# Patient Record
Sex: Female | Born: 1935 | Race: White | Hispanic: No | Smoking: Former smoker
Health system: Southern US, Community
[De-identification: ages and names within clinical notes are randomized; demographics above are authoritative.]

## PROBLEM LIST (undated history)

## (undated) DIAGNOSIS — R0989 Other specified symptoms and signs involving the circulatory and respiratory systems: Secondary | ICD-10-CM

## (undated) DIAGNOSIS — C4490 Unspecified malignant neoplasm of skin, unspecified: Secondary | ICD-10-CM

## (undated) DIAGNOSIS — S32000A Wedge compression fracture of unspecified lumbar vertebra, initial encounter for closed fracture: Secondary | ICD-10-CM

## (undated) DIAGNOSIS — I340 Nonrheumatic mitral (valve) insufficiency: Secondary | ICD-10-CM

## (undated) DIAGNOSIS — K219 Gastro-esophageal reflux disease without esophagitis: Secondary | ICD-10-CM

## (undated) DIAGNOSIS — I341 Nonrheumatic mitral (valve) prolapse: Secondary | ICD-10-CM

## (undated) DIAGNOSIS — I499 Cardiac arrhythmia, unspecified: Secondary | ICD-10-CM

## (undated) DIAGNOSIS — I4891 Unspecified atrial fibrillation: Secondary | ICD-10-CM

## (undated) DIAGNOSIS — K5790 Diverticulosis of intestine, part unspecified, without perforation or abscess without bleeding: Secondary | ICD-10-CM

## (undated) DIAGNOSIS — I5032 Chronic diastolic (congestive) heart failure: Secondary | ICD-10-CM

## (undated) DIAGNOSIS — K859 Acute pancreatitis without necrosis or infection, unspecified: Secondary | ICD-10-CM

## (undated) DIAGNOSIS — K222 Esophageal obstruction: Secondary | ICD-10-CM

## (undated) DIAGNOSIS — E7800 Pure hypercholesterolemia, unspecified: Secondary | ICD-10-CM

## (undated) DIAGNOSIS — N6019 Diffuse cystic mastopathy of unspecified breast: Secondary | ICD-10-CM

## (undated) DIAGNOSIS — K5792 Diverticulitis of intestine, part unspecified, without perforation or abscess without bleeding: Secondary | ICD-10-CM

## (undated) DIAGNOSIS — K449 Diaphragmatic hernia without obstruction or gangrene: Secondary | ICD-10-CM

## (undated) DIAGNOSIS — A419 Sepsis, unspecified organism: Secondary | ICD-10-CM

## (undated) HISTORY — DX: Nonrheumatic mitral (valve) insufficiency: I34.0

## (undated) HISTORY — DX: Chronic diastolic (congestive) heart failure: I50.32

## (undated) HISTORY — DX: Nonrheumatic mitral (valve) prolapse: I34.1

## (undated) HISTORY — DX: Esophageal obstruction: K22.2

## (undated) HISTORY — DX: Diffuse cystic mastopathy of unspecified breast: N60.19

## (undated) HISTORY — DX: Diverticulosis of intestine, part unspecified, without perforation or abscess without bleeding: K57.90

## (undated) HISTORY — DX: Diaphragmatic hernia without obstruction or gangrene: K44.9

## (undated) HISTORY — DX: Unspecified malignant neoplasm of skin, unspecified: C44.90

## (undated) HISTORY — DX: Wedge compression fracture of unspecified lumbar vertebra, initial encounter for closed fracture: S32.000A

## (undated) HISTORY — PX: SKIN CANCER EXCISION: SHX779

## (undated) HISTORY — DX: Gastro-esophageal reflux disease without esophagitis: K21.9

## (undated) HISTORY — PX: OTHER SURGICAL HISTORY: SHX169

## (undated) HISTORY — DX: Cardiac arrhythmia, unspecified: I49.9

## (undated) HISTORY — DX: Pure hypercholesterolemia, unspecified: E78.00

## (undated) HISTORY — DX: Other specified symptoms and signs involving the circulatory and respiratory systems: R09.89

## (undated) HISTORY — DX: Unspecified atrial fibrillation: I48.91

## (undated) HISTORY — DX: Diverticulitis of intestine, part unspecified, without perforation or abscess without bleeding: K57.92

## (undated) HISTORY — DX: Acute pancreatitis without necrosis or infection, unspecified: K85.90

---

## 1998-05-11 ENCOUNTER — Other Ambulatory Visit: Admission: RE | Admit: 1998-05-11 | Discharge: 1998-05-11 | Payer: Self-pay | Admitting: *Deleted

## 1999-08-29 ENCOUNTER — Other Ambulatory Visit: Admission: RE | Admit: 1999-08-29 | Discharge: 1999-08-29 | Payer: Self-pay | Admitting: Obstetrics and Gynecology

## 2000-09-25 ENCOUNTER — Other Ambulatory Visit: Admission: RE | Admit: 2000-09-25 | Discharge: 2000-09-25 | Payer: Self-pay | Admitting: Obstetrics and Gynecology

## 2001-10-22 ENCOUNTER — Other Ambulatory Visit: Admission: RE | Admit: 2001-10-22 | Discharge: 2001-10-22 | Payer: Self-pay | Admitting: Obstetrics and Gynecology

## 2002-11-08 ENCOUNTER — Other Ambulatory Visit: Admission: RE | Admit: 2002-11-08 | Discharge: 2002-11-08 | Payer: Self-pay | Admitting: Obstetrics and Gynecology

## 2003-12-21 ENCOUNTER — Other Ambulatory Visit: Admission: RE | Admit: 2003-12-21 | Discharge: 2003-12-21 | Payer: Self-pay | Admitting: Obstetrics and Gynecology

## 2004-04-06 ENCOUNTER — Ambulatory Visit: Payer: Self-pay | Admitting: Internal Medicine

## 2004-04-17 ENCOUNTER — Ambulatory Visit: Payer: Self-pay | Admitting: Internal Medicine

## 2004-05-07 ENCOUNTER — Ambulatory Visit: Payer: Self-pay | Admitting: Gastroenterology

## 2004-05-10 ENCOUNTER — Ambulatory Visit (HOSPITAL_COMMUNITY): Admission: RE | Admit: 2004-05-10 | Discharge: 2004-05-10 | Payer: Self-pay | Admitting: Gastroenterology

## 2004-05-29 ENCOUNTER — Ambulatory Visit: Payer: Self-pay | Admitting: Gastroenterology

## 2004-05-29 ENCOUNTER — Ambulatory Visit: Payer: Self-pay | Admitting: Internal Medicine

## 2004-06-03 DIAGNOSIS — K222 Esophageal obstruction: Secondary | ICD-10-CM

## 2004-06-03 HISTORY — PX: COLONOSCOPY: SHX174

## 2004-06-03 HISTORY — PX: ESOPHAGEAL DILATION: SHX303

## 2004-06-03 HISTORY — DX: Esophageal obstruction: K22.2

## 2004-06-14 ENCOUNTER — Ambulatory Visit: Payer: Self-pay | Admitting: Gastroenterology

## 2004-06-14 ENCOUNTER — Ambulatory Visit (HOSPITAL_COMMUNITY): Admission: RE | Admit: 2004-06-14 | Discharge: 2004-06-14 | Payer: Self-pay | Admitting: Gastroenterology

## 2004-06-14 DIAGNOSIS — Z8719 Personal history of other diseases of the digestive system: Secondary | ICD-10-CM

## 2004-06-14 HISTORY — DX: Personal history of other diseases of the digestive system: Z87.19

## 2004-08-14 ENCOUNTER — Ambulatory Visit: Payer: Self-pay | Admitting: Internal Medicine

## 2005-01-27 ENCOUNTER — Emergency Department: Payer: Self-pay | Admitting: Emergency Medicine

## 2005-11-21 ENCOUNTER — Ambulatory Visit: Payer: Self-pay | Admitting: Internal Medicine

## 2006-01-07 ENCOUNTER — Ambulatory Visit: Payer: Self-pay | Admitting: Internal Medicine

## 2006-01-29 ENCOUNTER — Ambulatory Visit: Payer: Self-pay | Admitting: Internal Medicine

## 2006-05-28 ENCOUNTER — Emergency Department: Payer: Self-pay | Admitting: Emergency Medicine

## 2006-06-05 ENCOUNTER — Ambulatory Visit: Payer: Self-pay | Admitting: Emergency Medicine

## 2006-06-09 ENCOUNTER — Ambulatory Visit: Payer: Self-pay | Admitting: Internal Medicine

## 2006-06-27 ENCOUNTER — Encounter: Payer: Self-pay | Admitting: Internal Medicine

## 2006-07-04 ENCOUNTER — Encounter: Payer: Self-pay | Admitting: Internal Medicine

## 2006-09-05 ENCOUNTER — Ambulatory Visit: Payer: Self-pay | Admitting: Internal Medicine

## 2007-01-06 ENCOUNTER — Encounter: Payer: Self-pay | Admitting: Internal Medicine

## 2007-01-12 ENCOUNTER — Encounter: Payer: Self-pay | Admitting: Internal Medicine

## 2007-01-15 ENCOUNTER — Encounter (INDEPENDENT_AMBULATORY_CARE_PROVIDER_SITE_OTHER): Payer: Self-pay | Admitting: *Deleted

## 2007-01-29 ENCOUNTER — Encounter: Payer: Self-pay | Admitting: Internal Medicine

## 2007-02-22 ENCOUNTER — Encounter: Payer: Self-pay | Admitting: Internal Medicine

## 2007-02-22 DIAGNOSIS — K219 Gastro-esophageal reflux disease without esophagitis: Secondary | ICD-10-CM

## 2007-03-23 ENCOUNTER — Ambulatory Visit: Payer: Self-pay | Admitting: Internal Medicine

## 2007-03-23 LAB — CONVERTED CEMR LAB: Vit D, 1,25-Dihydroxy: 52 (ref 30–89)

## 2007-07-22 ENCOUNTER — Telehealth (INDEPENDENT_AMBULATORY_CARE_PROVIDER_SITE_OTHER): Payer: Self-pay | Admitting: *Deleted

## 2007-07-23 ENCOUNTER — Encounter (INDEPENDENT_AMBULATORY_CARE_PROVIDER_SITE_OTHER): Payer: Self-pay | Admitting: *Deleted

## 2007-07-23 ENCOUNTER — Telehealth: Payer: Self-pay | Admitting: Internal Medicine

## 2007-07-24 ENCOUNTER — Encounter (INDEPENDENT_AMBULATORY_CARE_PROVIDER_SITE_OTHER): Payer: Self-pay | Admitting: *Deleted

## 2007-07-27 ENCOUNTER — Telehealth (INDEPENDENT_AMBULATORY_CARE_PROVIDER_SITE_OTHER): Payer: Self-pay | Admitting: *Deleted

## 2007-08-10 DIAGNOSIS — E785 Hyperlipidemia, unspecified: Secondary | ICD-10-CM

## 2007-08-10 DIAGNOSIS — S32009A Unspecified fracture of unspecified lumbar vertebra, initial encounter for closed fracture: Secondary | ICD-10-CM | POA: Insufficient documentation

## 2007-09-14 ENCOUNTER — Ambulatory Visit: Payer: Self-pay | Admitting: Internal Medicine

## 2007-09-14 LAB — CONVERTED CEMR LAB: Vit D, 1,25-Dihydroxy: 52 (ref 30–89)

## 2007-09-20 LAB — CONVERTED CEMR LAB
Basophils Absolute: 0 10*3/uL (ref 0.0–0.1)
Basophils Relative: 0.2 % (ref 0.0–1.0)
Calcium: 9.9 mg/dL (ref 8.4–10.5)
Eosinophils Absolute: 0.1 10*3/uL (ref 0.0–0.7)
Eosinophils Relative: 2.1 % (ref 0.0–5.0)
HCT: 37.5 % (ref 36.0–46.0)
Hemoglobin: 12.8 g/dL (ref 12.0–15.0)
Lymphocytes Relative: 29.3 % (ref 12.0–46.0)
MCHC: 34.1 g/dL (ref 30.0–36.0)
MCV: 91 fL (ref 78.0–100.0)
Monocytes Absolute: 0.7 10*3/uL (ref 0.1–1.0)
Monocytes Relative: 9.4 % (ref 3.0–12.0)
Neutro Abs: 4.2 10*3/uL (ref 1.4–7.7)
Neutrophils Relative %: 59 % (ref 43.0–77.0)
Platelets: 219 10*3/uL (ref 150–400)
RBC: 4.12 M/uL (ref 3.87–5.11)
RDW: 12.2 % (ref 11.5–14.6)
TSH: 3.72 microintl units/mL (ref 0.35–5.50)
WBC: 7.1 10*3/uL (ref 4.5–10.5)

## 2007-09-21 ENCOUNTER — Encounter (INDEPENDENT_AMBULATORY_CARE_PROVIDER_SITE_OTHER): Payer: Self-pay | Admitting: *Deleted

## 2007-09-21 ENCOUNTER — Ambulatory Visit: Payer: Self-pay | Admitting: Internal Medicine

## 2007-09-23 ENCOUNTER — Encounter (INDEPENDENT_AMBULATORY_CARE_PROVIDER_SITE_OTHER): Payer: Self-pay | Admitting: *Deleted

## 2007-09-23 LAB — CONVERTED CEMR LAB
Albumin, U: DETECTED %
Free Kappa Lt Chains,Ur: 3.15 mg/dL — ABNORMAL HIGH (ref 0.04–1.51)
Free Kappa/Lambda Ratio: 19.69 — ABNORMAL HIGH (ref 0.46–4.00)
Free Lambda Lt Chains,Ur: 0.16 mg/dL (ref 0.08–1.01)
Time: 24
Total Protein, Urine-Ur/day: 28 mg/24hr (ref 10–140)
Volume, Urine: 750 mL

## 2007-09-28 ENCOUNTER — Ambulatory Visit: Payer: Self-pay | Admitting: Internal Medicine

## 2007-09-28 ENCOUNTER — Telehealth (INDEPENDENT_AMBULATORY_CARE_PROVIDER_SITE_OTHER): Payer: Self-pay | Admitting: *Deleted

## 2007-10-05 LAB — CONVERTED CEMR LAB
ALT: 22 units/L (ref 0–35)
AST: 26 units/L (ref 0–37)
Albumin: 3.9 g/dL (ref 3.5–5.2)
Alkaline Phosphatase: 44 units/L (ref 39–117)
Bilirubin, Direct: 0.1 mg/dL (ref 0.0–0.3)
Cholesterol: 164 mg/dL (ref 0–200)
Glucose, Bld: 103 mg/dL — ABNORMAL HIGH (ref 70–99)
HDL: 59.1 mg/dL (ref >39.0)
LDL Cholesterol: 85 mg/dL (ref 0–99)
Total Bilirubin: 0.8 mg/dL (ref 0.3–1.2)
Total CHOL/HDL Ratio: 2.8
Total Protein: 6.9 g/dL (ref 6.0–8.3)
Triglycerides: 99 mg/dL (ref 0–149)
VLDL: 20 mg/dL (ref 0–40)

## 2007-11-18 ENCOUNTER — Other Ambulatory Visit: Payer: Self-pay

## 2007-11-18 ENCOUNTER — Emergency Department: Payer: Self-pay | Admitting: Emergency Medicine

## 2007-12-01 ENCOUNTER — Emergency Department: Payer: Self-pay | Admitting: Emergency Medicine

## 2008-01-02 DIAGNOSIS — K5792 Diverticulitis of intestine, part unspecified, without perforation or abscess without bleeding: Secondary | ICD-10-CM

## 2008-01-02 HISTORY — DX: Diverticulitis of intestine, part unspecified, without perforation or abscess without bleeding: K57.92

## 2008-04-12 ENCOUNTER — Encounter: Payer: Self-pay | Admitting: Internal Medicine

## 2008-07-04 DIAGNOSIS — K859 Acute pancreatitis without necrosis or infection, unspecified: Secondary | ICD-10-CM

## 2008-07-04 HISTORY — DX: Acute pancreatitis without necrosis or infection, unspecified: K85.90

## 2008-07-22 ENCOUNTER — Inpatient Hospital Stay: Payer: Self-pay | Admitting: Internal Medicine

## 2008-07-22 ENCOUNTER — Encounter: Payer: Self-pay | Admitting: Internal Medicine

## 2008-07-23 ENCOUNTER — Encounter: Payer: Self-pay | Admitting: Internal Medicine

## 2008-07-24 ENCOUNTER — Encounter: Payer: Self-pay | Admitting: Internal Medicine

## 2008-07-26 ENCOUNTER — Ambulatory Visit: Payer: Self-pay | Admitting: Internal Medicine

## 2008-07-26 DIAGNOSIS — Z8719 Personal history of other diseases of the digestive system: Secondary | ICD-10-CM | POA: Insufficient documentation

## 2008-07-26 DIAGNOSIS — T887XXA Unspecified adverse effect of drug or medicament, initial encounter: Secondary | ICD-10-CM

## 2008-07-26 LAB — CONVERTED CEMR LAB
ALT: 63 units/L — ABNORMAL HIGH (ref 0–35)
AST: 50 units/L — ABNORMAL HIGH (ref 0–37)
Albumin: 3.8 g/dL (ref 3.5–5.2)
Alkaline Phosphatase: 80 units/L (ref 39–117)
Amylase: 187 units/L — ABNORMAL HIGH (ref 27–131)
BUN: 6 mg/dL (ref 6–23)
Bilirubin, Direct: 0.3 mg/dL (ref 0.0–0.3)
CO2: 26 meq/L (ref 19–32)
Calcium: 9.6 mg/dL (ref 8.4–10.5)
Chloride: 108 meq/L (ref 96–112)
Creatinine, Ser: 0.8 mg/dL (ref 0.4–1.2)
GFR calc Af Amer: 88 mL/min
GFR calc non Af Amer: 73 mL/min
Glucose, Bld: 94 mg/dL (ref 70–99)
Lipase: 74 units/L — ABNORMAL HIGH (ref 11.0–59.0)
Phosphorus: 3.2 mg/dL (ref 2.3–4.6)
Potassium: 4.5 meq/L (ref 3.5–5.1)
Sodium: 142 meq/L (ref 135–145)
Total Bilirubin: 1 mg/dL (ref 0.3–1.2)
Total Protein: 7 g/dL (ref 6.0–8.3)

## 2008-07-27 ENCOUNTER — Telehealth (INDEPENDENT_AMBULATORY_CARE_PROVIDER_SITE_OTHER): Payer: Self-pay | Admitting: *Deleted

## 2008-08-01 ENCOUNTER — Telehealth (INDEPENDENT_AMBULATORY_CARE_PROVIDER_SITE_OTHER): Payer: Self-pay | Admitting: *Deleted

## 2008-08-08 ENCOUNTER — Ambulatory Visit: Payer: Self-pay | Admitting: Internal Medicine

## 2008-08-08 ENCOUNTER — Telehealth (INDEPENDENT_AMBULATORY_CARE_PROVIDER_SITE_OTHER): Payer: Self-pay | Admitting: *Deleted

## 2008-08-11 LAB — CONVERTED CEMR LAB
ALT: 18 units/L (ref 0–35)
AST: 25 units/L (ref 0–37)
Albumin: 3.8 g/dL (ref 3.5–5.2)
Alkaline Phosphatase: 47 units/L (ref 39–117)
Amylase: 147 units/L — ABNORMAL HIGH (ref 27–131)
Bilirubin, Direct: 0.1 mg/dL (ref 0.0–0.3)
Lipase: 40 units/L (ref 11.0–59.0)
Total Bilirubin: 0.7 mg/dL (ref 0.3–1.2)
Total Protein: 6.8 g/dL (ref 6.0–8.3)

## 2008-08-12 ENCOUNTER — Encounter (INDEPENDENT_AMBULATORY_CARE_PROVIDER_SITE_OTHER): Payer: Self-pay | Admitting: *Deleted

## 2008-09-15 ENCOUNTER — Ambulatory Visit: Payer: Self-pay | Admitting: Internal Medicine

## 2008-09-15 LAB — CONVERTED CEMR LAB
Bilirubin Urine: NEGATIVE
Blood in Urine, dipstick: NEGATIVE
Glucose, Urine, Semiquant: NEGATIVE
Ketones, urine, test strip: NEGATIVE
Nitrite: NEGATIVE
Protein, U semiquant: NEGATIVE
Specific Gravity, Urine: 1.015
Urobilinogen, UA: 0.2
WBC Urine, dipstick: NEGATIVE
pH: 6

## 2008-09-20 ENCOUNTER — Encounter (INDEPENDENT_AMBULATORY_CARE_PROVIDER_SITE_OTHER): Payer: Self-pay | Admitting: *Deleted

## 2008-10-11 ENCOUNTER — Encounter (INDEPENDENT_AMBULATORY_CARE_PROVIDER_SITE_OTHER): Payer: Self-pay | Admitting: *Deleted

## 2008-10-11 ENCOUNTER — Ambulatory Visit: Payer: Self-pay | Admitting: Internal Medicine

## 2008-10-11 LAB — CONVERTED CEMR LAB
OCCULT 1: NEGATIVE
OCCULT 2: NEGATIVE
OCCULT 3: NEGATIVE

## 2008-10-20 ENCOUNTER — Ambulatory Visit: Payer: Self-pay | Admitting: Internal Medicine

## 2008-10-25 ENCOUNTER — Encounter: Payer: Self-pay | Admitting: Internal Medicine

## 2008-10-29 LAB — CONVERTED CEMR LAB
ALT: 14 units/L (ref 0–35)
AST: 22 units/L (ref 0–37)
Albumin: 3.6 g/dL (ref 3.5–5.2)
Alkaline Phosphatase: 50 units/L (ref 39–117)
Amylase: 100 units/L (ref 27–131)
Bilirubin, Direct: 0.1 mg/dL (ref 0.0–0.3)
Lipase: 17 units/L (ref 11.0–59.0)
Total Bilirubin: 0.7 mg/dL (ref 0.3–1.2)
Total Protein: 6.8 g/dL (ref 6.0–8.3)

## 2008-11-03 ENCOUNTER — Telehealth (INDEPENDENT_AMBULATORY_CARE_PROVIDER_SITE_OTHER): Payer: Self-pay | Admitting: *Deleted

## 2008-11-10 ENCOUNTER — Ambulatory Visit: Payer: Self-pay | Admitting: Internal Medicine

## 2008-12-22 ENCOUNTER — Ambulatory Visit: Payer: Self-pay | Admitting: Internal Medicine

## 2008-12-22 LAB — CONVERTED CEMR LAB
ALT: 15 U/L
AST: 23 U/L
Albumin: 3.6 g/dL
Alkaline Phosphatase: 39 U/L
Bilirubin, Direct: 0.1 mg/dL
Cholesterol: 211 mg/dL — ABNORMAL HIGH
Direct LDL: 140.9 mg/dL
HDL: 57.9 mg/dL
Total Bilirubin: 0.9 mg/dL
Total CHOL/HDL Ratio: 4
Total CK: 99 U/L
Total Protein: 6.6 g/dL
Triglycerides: 60 mg/dL
VLDL: 12 mg/dL

## 2008-12-29 ENCOUNTER — Ambulatory Visit: Payer: Self-pay | Admitting: Internal Medicine

## 2008-12-29 DIAGNOSIS — Z85828 Personal history of other malignant neoplasm of skin: Secondary | ICD-10-CM

## 2008-12-29 HISTORY — DX: Personal history of other malignant neoplasm of skin: Z85.828

## 2008-12-29 LAB — CONVERTED CEMR LAB
Cholesterol, target level: 200 mg/dL
HDL goal, serum: 50 mg/dL
LDL Goal: 120 mg/dL

## 2009-01-06 LAB — CONVERTED CEMR LAB: Vit D, 25-Hydroxy: 23 ng/mL — ABNORMAL LOW (ref 30–89)

## 2009-01-10 ENCOUNTER — Encounter (INDEPENDENT_AMBULATORY_CARE_PROVIDER_SITE_OTHER): Payer: Self-pay | Admitting: *Deleted

## 2009-01-10 ENCOUNTER — Telehealth (INDEPENDENT_AMBULATORY_CARE_PROVIDER_SITE_OTHER): Payer: Self-pay | Admitting: *Deleted

## 2009-02-24 ENCOUNTER — Telehealth (INDEPENDENT_AMBULATORY_CARE_PROVIDER_SITE_OTHER): Payer: Self-pay | Admitting: *Deleted

## 2009-02-28 ENCOUNTER — Encounter: Payer: Self-pay | Admitting: Internal Medicine

## 2009-05-04 ENCOUNTER — Ambulatory Visit: Payer: Self-pay | Admitting: Internal Medicine

## 2009-05-14 LAB — CONVERTED CEMR LAB
ALT: 15 units/L (ref 0–35)
AST: 28 units/L (ref 0–37)
Albumin: 3.9 g/dL (ref 3.5–5.2)
Alkaline Phosphatase: 40 units/L (ref 39–117)
Bilirubin, Direct: 0.1 mg/dL (ref 0.0–0.3)
Cholesterol: 214 mg/dL — ABNORMAL HIGH (ref 0–200)
Direct LDL: 130.8 mg/dL
HDL: 70.3 mg/dL (ref >39.00)
Total Bilirubin: 0.8 mg/dL (ref 0.3–1.2)
Total CHOL/HDL Ratio: 3
Total Protein: 7 g/dL (ref 6.0–8.3)
Triglycerides: 78 mg/dL (ref 0.0–149.0)
VLDL: 15.6 mg/dL (ref 0.0–40.0)

## 2009-05-15 ENCOUNTER — Encounter (INDEPENDENT_AMBULATORY_CARE_PROVIDER_SITE_OTHER): Payer: Self-pay | Admitting: *Deleted

## 2009-06-14 ENCOUNTER — Ambulatory Visit: Payer: Self-pay | Admitting: Internal Medicine

## 2009-06-18 LAB — CONVERTED CEMR LAB: Vit D, 25-Hydroxy: 82 ng/mL (ref 30–89)

## 2009-06-19 ENCOUNTER — Encounter (INDEPENDENT_AMBULATORY_CARE_PROVIDER_SITE_OTHER): Payer: Self-pay | Admitting: *Deleted

## 2009-07-10 ENCOUNTER — Encounter: Payer: Self-pay | Admitting: Internal Medicine

## 2009-12-18 ENCOUNTER — Telehealth (INDEPENDENT_AMBULATORY_CARE_PROVIDER_SITE_OTHER): Payer: Self-pay | Admitting: *Deleted

## 2009-12-27 ENCOUNTER — Ambulatory Visit: Payer: Self-pay | Admitting: Internal Medicine

## 2010-01-05 LAB — CONVERTED CEMR LAB
ALT: 16 units/L (ref 0–35)
AST: 25 units/L (ref 0–37)
Albumin: 4.1 g/dL (ref 3.5–5.2)
Alkaline Phosphatase: 40 units/L (ref 39–117)
Bilirubin, Direct: 0.2 mg/dL (ref 0.0–0.3)
Cholesterol: 214 mg/dL — ABNORMAL HIGH (ref 0–200)
Direct LDL: 123.5 mg/dL
HDL: 72.3 mg/dL (ref >39.00)
Total Bilirubin: 0.9 mg/dL (ref 0.3–1.2)
Total CHOL/HDL Ratio: 3
Total CK: 113 units/L (ref 7–177)
Total Protein: 6.9 g/dL (ref 6.0–8.3)
Triglycerides: 50 mg/dL (ref 0.0–149.0)
VLDL: 10 mg/dL (ref 0.0–40.0)

## 2010-01-09 ENCOUNTER — Ambulatory Visit: Payer: Self-pay | Admitting: Internal Medicine

## 2010-01-09 DIAGNOSIS — M810 Age-related osteoporosis without current pathological fracture: Secondary | ICD-10-CM

## 2010-01-09 DIAGNOSIS — H9190 Unspecified hearing loss, unspecified ear: Secondary | ICD-10-CM | POA: Insufficient documentation

## 2010-07-03 NOTE — Progress Notes (Signed)
Summary: Rfill Request  Phone Note Refill Request Message from:  Patient  Refills Requested: Medication #1:  CRESTOR 20 MG TABS 1 [employer] [address]   Method Requested: Fax to [facility]  Follow-up for Phone Call        Patient scheduled appointment for 12/27/2009 for labs that were to be done in 08/2009 Follow-up by: Shonna Chock CMA,  December 18, 2009 9:18 AM    Prescriptions: CRESTOR 20 MG TABS (ROSUVASTATIN CALCIUM) 1 Mon, Weds  & Fri, **APPOINTMENT DUE**  #12 x 0   Entered by:   Shonna Chock CMA   Authorized by:   Marga Melnick MD   Signed by:   Shonna Chock CMA on 12/18/2009   Method used:   Electronically to        [facility]. [address] [phone]* (retail)       [address] [place], [place]  [id number]       Ph: [phone]       Fax: [phone]   RxID:   [id number]

## 2010-07-03 NOTE — Letter (Signed)
Summary: Results Follow up Letter  [facility] [place]  [address] [place], [place] [address]   Phone: [phone]  Fax: [phone]    06/19/2009 MRN: [id number]  Kylie May [address] [place], [place]  [address]  Dear [name],  The following are the results of your recent test(s):  Test         Result    Pap Smear:        Normal _____  Not Normal _____ Comments: ______________________________________________________ Cholesterol: LDL(Bad cholesterol):         Your goal is less than:         HDL (Good cholesterol):       Your goal is more than: Comments:  ______________________________________________________ Mammogram:        Normal _____  Not Normal _____ Comments:  ___________________________________________________________________ Hemoccult:        Normal _____  Not normal _______ Comments:    _____________________________________________________________________ Other Tests: Please see attached labs done on 06/14/2009    We routinely do not discuss normal results over the telephone.  If you desire a copy of the results, or you have any questions about this information we can discuss them at your next office visit.   Sincerely,

## 2010-07-03 NOTE — Assessment & Plan Note (Signed)
Summary: YEARLY EXAM--HAS HAD FASTING LABS, NEEDS NONFASTING LABS///SPH   Vital Signs:  Patient profile:   77 year old May Height:      60.75 inches Weight:      114.2 pounds BMI:     21.83 Temp:     98.0 degrees F oral Pulse rate:   72 / minute Resp:     14 per minute BP sitting:   120 / 70  (left arm) Cuff size:   regular  Vitals Entered By: Shonna Chock CMA (January 09, 2010 10:58 AM) CC: Yearly exam and discuss labs (patient with copy that was mailed to her), Lipid Management Comments **Patient will check clinic in [place] to see the date of her last pneumonia vaccine, if not UTD patient to call for a nurse visit appointment    CC:  Yearly exam and discuss labs (patient with copy that was mailed to her) and Lipid Management.  History of Present Illness: Here for Medicare AWV: 1.Risk factors based on Past M, S, F history:GERD,Osteoporosis,Dyslipidemia (chart updated) 2.Physical Activities: see data 3.Depression/mood: denied 4.Hearing: She is seeing Audiologist, Michaelene Song, [place], [place],a new unit has been Rxed 5.ADL's: no limitations 6.Fall Risk: Osteopenia 7.Home Safety: no issues 8.Height, weight, &visual acuity:wall chart read @ 6 ft 9.Counseling: none requested  10.Labs ordered based on risk factors: see Orders 11. Referral Coordination: none required 12.Care Plan: see Instructions 13. Cognitive Assessment: Oriented X 3; memory  & recall  excellent ; "WORLD" spelled  backwards; affect & mood normal. Hyperlipidemia Follow-Up      This is an 77 year old woman who presents for Hyperlipidemia follow-up.  The patient denies muscle aches, GI upset, abdominal pain, flushing, itching, constipation, diarrhea, and fatigue.  The patient denies the following symptoms: chest pain/pressure, exercise intolerance, dypsnea, palpitations, syncope, and pedal edema.  Compliance with medications (by patient report) has been near 100%.  Dietary compliance has been good.  The patient  reports no exercise.  Adjunctive measures currently used by the patient include ASA.    Lipid Management History:      Positive NCEP/ATP III risk factors include May age 1 years old or older and family history for ischemic heart disease (females less than 73 years old).  Negative NCEP/ATP III risk factors include no history of early menopause without estrogen hormone replacement, non-diabetic, HDL cholesterol greater than 60, non-tobacco-user status, non-hypertensive, no ASHD (atherosclerotic heart disease), no prior stroke/TIA, no peripheral vascular disease, and no history of aortic aneurysm.     Preventive Screening-Counseling & Management  Alcohol-Tobacco     Alcohol drinks/day: 1     Smoking Status: never  Caffeine-Diet-Exercise     Caffeine use/day: 1 cup/ day     Diet Comments: no diet     Does Patient Exercise: no  Hep-HIV-STD-Contraception     Dental Visit-last 6 months yes     Sun Exposure-Excessive: no  Safety-Violence-Falls     Seat Belt Use: yes     Firearms in the Home: no firearms in the home     Smoke Detectors: yes     Violence in the Home: no risk noted     Fall Risk: Osteopenia is risk      Blood Transfusions:  no.        Travel History:  no recent travel.    Current Medications (verified): 1)  Nexium 40 Mg Cpdr (Esomeprazole Magnesium) .... Take One Capsule Every Morning As Directed 2)  Asa 81mg  3)  Calcium 600mg  With Vit  D 400 Iu .[name].. 1 By Mouth Two Times A Day 4)  Crestor 20 Mg Tabs (Rosuvastatin Calcium) .[name].. 1 Mon, Weds  & Fri, **appointment Due**  Allergies: 1)  ! Flagyl  Past History:  Past Medical History: DIVERTICULOSIS, COLON (ICD-562.10) SCHATZKI'S RING, HX OF (ICD-V12.79);ESOPHAGEAL STRICTURE (ICD-530.3), PMH of  HIATAL HERNIA (ICD-553.3) COMPRESSION FRACTURE, LUMBAR VERTEBRAE (ICD-805.4) FEMORAL BRUIT, RIGHT (ICD-785.9) FIBROCYSTIC BREAST DISEASE (ICD-610.1) MITRAL VALVE PROLAPSE (ICD-424.0) HYPERCHOLESTEROLEMIA (ICD-272.0):  [place] Study LDL goal = < 160. ACID REFLUX DISEASE (ICD-530.81) Pancreatitis Hospitalized 02/ 2010 Diverticulitis, hx of 01/2008, GI ,[facility] Skin cancer, hx of, facial X2 Osteoporosis, Dr Marcelle Overlie  Social History: Smoking Status:  never Caffeine use/day:  1 cup/ day Does Patient Exercise:  no Dental Care w/in 6 mos.:  yes Sun Exposure-Excessive:  no Seat Belt Use:  yes Fall Risk:  Osteopenia is risk Blood Transfusions:  no  Review of Systems  The patient denies anorexia, fever, weight loss, weight gain, hoarseness, prolonged cough, hemoptysis, abdominal pain, melena, hematochezia, severe indigestion/heartburn, hematuria, incontinence, suspicious skin lesions, unusual weight change, abnormal bleeding, enlarged lymph nodes, and angioedema.         Dr Marcelle Overlie monitoring Osteoporosis. Vit D level was 82 in 06/2009  Physical Exam  General:  Appears much younger than age; alert,appropriate and cooperative throughout examination Head:  Normocephalic and atraumatic without obvious abnormalities.  Eyes:  No corneal or conjunctival inflammation noted. EOMI. Perrla. Slight ptosis OS Ears:  Hearing is grossly decreased  bilaterally despite aids. Nose:  External nasal examination shows no deformity or inflammation. Nasal mucosa are pink and moist without lesions or exudates. Mouth:  Oral mucosa and oropharynx without lesions or exudates.  Teeth in good repair. Neck:  No deformities, masses, or tenderness noted. Lungs:  Normal respiratory effort, chest expands symmetrically. Lungs are clear to auscultation, no crackles or wheezes. Heart:  normal rate, regular rhythm, no gallop, no rub, no JVD, no HJR, and grade 1 /6 systolic murmur.   Abdomen:  Bowel sounds positive,abdomen soft and non-tender without masses, organomegaly or hernias noted. Msk:  Scoliosis Pulses:  R and L carotid,radial,dorsalis pedis and posterior tibial pulses are full and equal bilaterally Extremities:  No clubbing,  cyanosis, edema. DIP OA deformity  on . Normal full range of motion of all joints(exceptionally good ).   Neurologic:  alert & oriented X3 and DTRs symmetrical and normal.   Skin:  Intact without suspicious lesions or rashes Cervical Nodes:  No lymphadenopathy noted Axillary Nodes:  No palpable lymphadenopathy Psych:  memory intact for recent and remote, normally interactive, good eye contact, and not anxious appearing.     Impression & Recommendations:  Problem # 1:  PREVENTIVE HEALTH CARE (ICD-V70.0)  Orders: [facility] -Subsequent Annual Wellness Visit [phone])  Problem # 2:  HYPERCHOLESTEROLEMIA (ICD-272.0)  Her updated medication list for this problem includes:    Crestor 20 Mg Tabs (Rosuvastatin calcium) .[name]... 1 mon, weds  & fri  Orders: EKG w/ Interpretation (93000)  Problem # 3:  HEARING LOSS, BILATERAL (ICD-389.9) Severe; F/U with Audiologist recommended  Problem # 4:  ESOPHAGEAL STRICTURE (ICD-530.3) PMH of, essentially asymptomatic @ present  Complete Medication List: 1)  Nexium 40 Mg Cpdr (Esomeprazole magnesium) .... Take one capsule every morning as directed 2)  Asa 81mg   3)  Calcium 600mg  With Vit D 400 Iu  .[name].. 1 by mouth two times a day 4)  Crestor 20 Mg Tabs (Rosuvastatin calcium) .[name].. 1 mon, weds  & fri  Other Orders: Tdap =>  66yrs IM ([address]109) Admin 1st Vaccine (60454)  Lipid Assessment/Plan:      Based on NCEP/ATP III, the patient's risk factor category is "0-1 risk factors".  The patient's lipid goals are as follows: Total cholesterol goal is 200; LDL cholesterol goal is 120; HDL cholesterol goal is 50; Triglyceride goal is 150.  Her LDL cholesterol goal has not been met.  Secondary causes for hyperlipidemia have been ruled out.  She has been counseled on adjunctive measures for lowering her cholesterol and has been provided with dietary instructions.    Patient Instructions: 1)  Please follow up with your Audiologist.Take an 81 mg coated  Aspirin every day. 2)   Please schedule a follow-up appointment in 1 year. 3)  Hepatic Panel prior to visit, ICD-9: 995.20 4)  Lipid Panel prior to visit, ICD-9:272.4 5)  Avoid foods high in acid (tomatoes, citrus juices, spicy foods). Avoid eating within two hours of lying down or before exercising. Do not over eat; try smaller more frequent meals. Elevate head of bed twelve inches when sleeping. Report any weight loss, rectal bleeding , black tarry stool or food "sticking " Prescriptions: CRESTOR 20 MG TABS (ROSUVASTATIN CALCIUM) 1 Mon, Weds  & Fri  #30 x 5   Entered and Authorized by:   Marga Melnick MD   Signed by:   Marga Melnick MD on 01/09/2010   Method used:   Print then Give to Patient   RxID:   [id number]    Immunizations Administered:  Tetanus Vaccine:    Vaccine Type: Tdap    Site: right deltoid    Mfr: GlaxoSmithKline    Dose: 0.5 ml    Route: IM    Given by: Shonna Chock CMA    Exp. Date: 08/26/2011    Lot #: [id number]    VIS given: 04/21/07 version given January 09, 2010.

## 2010-07-12 ENCOUNTER — Encounter: Payer: Self-pay | Admitting: Internal Medicine

## 2010-10-16 NOTE — Assessment & Plan Note (Signed)
[facility]                         GASTROENTEROLOGY OFFICE NOTE   NAME:May, [name]                   MRN:          [id number]  DATE:03/23/2007                            DOB:          23-Nov-1921    CHIEF COMPLAINT:  Discussing Nexium.   Dr. Alwyn May asked her to come.  She has osteopenia or osteoporosis.  She  has had improvement of her Dexascan coordinated by Dr. Marcelle May of  [facility].  She had wondered about whether she really needed  Nexium and could not really remember exactly why she was on it.  She had  an esophageal stricture dilated in 2006 by Dr. Victorino May, she has a  hiatal hernia.  She has no more dysphagia, no significant heartburn  symptoms on Nexium 40 mg daily.   Her other medications are:  1. Calcium with vitamin D 600 mg two each day.  2. Fosamax Plus D 70 mg/2800 units of vitamin D weekly.  3. Lipitor 20 mg daily.  4. Aspirin 81 mg daily.  5. Nexium 40 mg daily.   PAST MEDICAL HISTORY:  1. Osteopenia.  2. Gastroesophageal reflux disease.  3. Hiatal hernia.  4. Esophageal stricture as noted on colonoscopy 2006 (Dr. Corinda May).  5. Diverticulosis.  6. Dyslipidemia.   PHYSICAL EXAMINATION:  GENERAL:  She is in no acute distress.  VITAL SIGNS:  Weight 121 pounds, pulse 80, blood pressure 120/70.   ASSESSMENT:  1. Gastroesophageal reflux disease with esophageal stricture doing      well on Nexium therapy.  2. Osteopenia.  Certainly, Nexium can impair absorption of calcium and      vitamin D.  At least it seems so from literature.   PLAN:  1. I think she should stay on the Nexium as well as her      osteopenia/osteoporosis therapy.  She clearly has an esophageal      stricture and an appropriate reason to stay on Nexium.  Taking      Nexium daily can greatly reduce the chance that she would need an      esophageal dilation again in the future.  2. We will check a vitamin D level.  It is probably fine but  she may      not be absorbing this and      might need higher doses.  I will let her and Dr. Alwyn May and Dr.      Marcelle May know the result of that test, otherwise continue current      therapy and see me as needed.     Iva Boop, MD,FACG  Electronically Signed    CEG/MedQ  DD: 03/23/2007  DT: 03/24/2007  Job #: [id number]   cc:   Kylie May. Kylie Ren, MD,FACP,FCCP  Kylie May. Kylie May, M.D.

## 2010-10-19 NOTE — Assessment & Plan Note (Signed)
[facility]                        [facility] [facility] OFFICE NOTE   NAME:May, Kylie ZUELKE                   MRN:          [id number]  DATE:09/05/2006                            DOB:          Mar 21, 1922    [name] was seen September 05, 2006 complaining of low back pain on  the left. She has previously received physical therapy for radicular  pain on the right with significant improvement. She contacted physical  therapy and they recommended she perform previously prescribed exercises  & employ a brace .She had no improvement with these measures & employing  heat and ibuprofen. The pain became severe enough that she had pain with  each step as intense as 10 on a 10 scale. It actually improved April 3.  She will occasionally have aching in the left popliteal area and the  calf suggesting an S2 distribution.   She has negative straight leg raising and normal reflexes. Her gait is  normal. There is striking scoliosis of the thoracolumbar spine. She did  make the comment that her pain will improve straightening her back.   It appears that she has an S2 nerve root irritation. I will recommend  arthritis strength Tylenol at bedtime and Celebrex 200 mg each morning  as needed. If the pain persists at the level she described before  a  fentanyl patch 25 mcg every 3 days will be employed.   She does have a prior history of epidural steroid injections and this  could be reconsidered if the symptoms persist or progress.     Titus Dubin. Alwyn Ren, MD,FACP,FCCP  Electronically Signed    [facility]/MedQ  DD: 09/05/2006  DT: 09/05/2006  Job #: [id number]

## 2010-10-19 NOTE — Assessment & Plan Note (Signed)
[facility]                        [facility] OFFICE NOTE   NAME:Mullinax, LONNI DIRDEN                   MRN:          [id number]  DATE:06/09/2006                            DOB:          27-Oct-1921    Kylie May was seen on June 09, 2006, for follow-up of  musculoskeletal injury sustained in a fall approximately 1 month ago.  She was seen at Urgent Care and placed on Celebrex and a prednisone  Dosepak.  She was also seen in the emergency room on December 26 for  lumbosacral pain.  An MRI June 05, 2006, revealed no fracture.  She  had foraminal narrowing at L3-4 and L4-5 related to degenerative disk  disease.   She denied any paresthesias or incontinence of urine or stool.   She stated that she was significantly improved; previously she could not  lift  or stand on the right leg.  When seen in the emergency room, she  was having pain in the right anterior thigh with a pressure sensation in  the lumbosacral area.  The evaluation had been completed at the [facility] as she lives in the [place] area.   She was using a walker for ambulation.  Straight leg raising was  negative.   Flexeril 5 mg at bedtime was prescribed along with Arthritis Strength  Tylenol.  Gabapentin 100 mg every 8 hours as needed for pain was also  prescribed.  Physical therapy and occupational therapy will be completed  through the [facility].   Because of the logistics, she will transfer her care to a doctor in the  [place] area or possibly to the [facility] facility.   PAST MEDICAL HISTORY:  1. Epidural steroid injections for spinal stenosis.  2. She has had a skin cancer removed from the left nasal area.  3. She has had 3 pregnancies and 3 deliveries.  Her last pregnancy      involved twins, one of whom died.  4. She has also had a fractured ankle.  5. She has been evaluated by Ulyess Mort,  MD, for intermittent      dysphagia and odynophagia in the context of a previous history of      hiatal hernia.  6. She also has osteopenia with T-scores of -2 in the spine and -1.5      in the hip.  She has tolerated Fosamax one weekly.  Consideration      will be given to [facility] monthly if she has significant GI symptoms.  7. In 1998 she did have abnormal protein in her urine, but this was      negative on follow-up.   She remains on Lipitor and Nexium as well as calcium and vitamin A  supplements.   Her records will be transferred once her daughter has established who  will serve as her new primary care physician.     Titus Dubin. Alwyn Ren, MD,FACP,FCCP  Electronically Signed    [facility]/MedQ  DD: 07/03/2006  DT: 07/03/2006  Job #: [id number]

## 2010-10-29 ENCOUNTER — Other Ambulatory Visit: Payer: Self-pay | Admitting: Internal Medicine

## 2010-11-27 ENCOUNTER — Encounter: Payer: Self-pay | Admitting: Internal Medicine

## 2010-11-28 ENCOUNTER — Ambulatory Visit (INDEPENDENT_AMBULATORY_CARE_PROVIDER_SITE_OTHER): Payer: Medicare Other | Admitting: Internal Medicine

## 2010-11-28 ENCOUNTER — Encounter: Payer: Self-pay | Admitting: Internal Medicine

## 2010-11-28 ENCOUNTER — Telehealth: Payer: Self-pay | Admitting: *Deleted

## 2010-11-28 DIAGNOSIS — K219 Gastro-esophageal reflux disease without esophagitis: Secondary | ICD-10-CM

## 2010-11-28 DIAGNOSIS — E559 Vitamin D deficiency, unspecified: Secondary | ICD-10-CM | POA: Insufficient documentation

## 2010-11-28 DIAGNOSIS — E7800 Pure hypercholesterolemia, unspecified: Secondary | ICD-10-CM

## 2010-11-28 DIAGNOSIS — M810 Age-related osteoporosis without current pathological fracture: Secondary | ICD-10-CM

## 2010-11-28 LAB — CBC WITH DIFFERENTIAL/PLATELET
Basophils Absolute: 0 10*3/uL (ref 0.0–0.1)
Basophils Relative: 0.5 % (ref 0.0–3.0)
Eosinophils Absolute: 0.1 10*3/uL (ref 0.0–0.7)
Eosinophils Relative: 1.5 % (ref 0.0–5.0)
HCT: 40.1 % (ref 36.0–46.0)
Hemoglobin: 13.5 g/dL (ref 12.0–15.0)
Lymphocytes Relative: 28.1 % (ref 12.0–46.0)
Lymphs Abs: 2.3 10*3/uL (ref 0.7–4.0)
MCHC: 33.6 g/dL (ref 30.0–36.0)
Monocytes Absolute: 0.7 10*3/uL (ref 0.1–1.0)
Monocytes Relative: 8 % (ref 3.0–12.0)
Neutro Abs: 5.2 10*3/uL (ref 1.4–7.7)
Neutrophils Relative %: 61.9 % (ref 43.0–77.0)
Platelets: 232 10*3/uL (ref 150.0–400.0)
RBC: 4.31 Mil/uL (ref 3.87–5.11)
RDW: 12.9 % (ref 11.5–14.6)
WBC: 8.4 10*3/uL (ref 4.5–10.5)

## 2010-11-28 LAB — LDL CHOLESTEROL, DIRECT: Direct LDL: 116.7 mg/dL

## 2010-11-28 LAB — BASIC METABOLIC PANEL
BUN: 17 mg/dL (ref 6–23)
CO2: 28 mEq/L (ref 19–32)
Calcium: 10 mg/dL (ref 8.4–10.5)
Chloride: 106 mEq/L (ref 96–112)
GFR: 59.13 mL/min — ABNORMAL LOW (ref >60.00)
Glucose, Bld: 87 mg/dL (ref 70–99)
Potassium: 6.2 mEq/L (ref 3.5–5.1)
Sodium: 144 mEq/L (ref 135–145)

## 2010-11-28 LAB — LIPID PANEL
Cholesterol: 219 mg/dL — ABNORMAL HIGH (ref 0–200)
HDL: 74.3 mg/dL (ref >39.00)
Total CHOL/HDL Ratio: 3
VLDL: 17.6 mg/dL (ref 0.0–40.0)

## 2010-11-28 LAB — HEPATIC FUNCTION PANEL
ALT: 18 U/L (ref 0–35)
AST: 29 U/L (ref 0–37)
Albumin: 4.4 g/dL (ref 3.5–5.2)
Alkaline Phosphatase: 42 U/L (ref 39–117)
Bilirubin, Direct: 0.2 mg/dL (ref 0.0–0.3)
Total Bilirubin: 0.9 mg/dL (ref 0.3–1.2)
Total Protein: 6.9 g/dL (ref 6.0–8.3)

## 2010-11-28 LAB — TSH: TSH: 4.02 u[IU]/mL (ref 0.35–5.50)

## 2010-11-28 LAB — VITAMIN D 25 HYDROXY (VIT D DEFICIENCY, FRACTURES): Vit D, 25-Hydroxy: 42 ng/mL (ref 30–89)

## 2010-11-28 MED ORDER — ESOMEPRAZOLE MAGNESIUM 40 MG PO CPDR: 40.0000 mg | DELAYED_RELEASE_CAPSULE | Freq: Every day | ORAL | Status: DC

## 2010-11-28 NOTE — Patient Instructions (Signed)
The triggers for dyspepsia or "heart burn"  include stress; the "aspirin family" ; alcohol; peppermint; and caffeine (coffee, tea, cola, and chocolate). The aspirin family would include aspirin and the nonsteroidal agents such as ibuprofen &  Naproxen. Tylenol would not cause reflux. If having dyspepsia ; food & dink should be avoided for @ least 2 hors before going to bed.  

## 2010-11-28 NOTE — Progress Notes (Signed)
  Subjective:    Patient ID: Kylie May, female    DOB: 06/29/1921, 77 y.o.   MRN: [id number]  HPI Dyslipidemia assessment:    Family history of premature CAD/ MI: daughter MI @ 55 .  Nutrition: no plan .  Exercise: no . Diabetes : no . HTN: no. Smoking history  : quit 1951 .   Weight :  stable. ROS: fatigue: no ; chest pain : no ;claudication: no; palpitations: no; abd pain/bowel changes: no ; myalgias:no;  syncope : no ; memory loss: no;skin changes: skin cancer treated @ [facility] 09/03/10 Lab results reviewed :LDL 123.5, HDL 72.3 .     Review of Systems Vit D level 23 in 2010; 82 in 01/12. Intermittent hoarseness & sneezing     Objective:   Physical Exam Gen.Thin but healthy and well-nourished in appearance. Alert, appropriate and cooperative throughout exam. Appears younger than age Eyes: No corneal or conjunctival inflammation noted. Ears: External  ear exam reveals no significant lesions or deformities. Canals clear .TMs dull. Hearing is grossly decreased  Bilaterally despite aids. Nose: External nasal exam reveals no deformity or inflammation. Nasal mucosa are pink and moist. No lesions or exudates noted.   Mouth: Oral mucosa and oropharynx reveal no lesions or exudates. Teeth in good repair. Neck: No deformities, masses, or tenderness noted.  Thyroid  normal. Lungs: Normal respiratory effort; chest expands symmetrically. Lungs are clear to auscultation without rales, wheezes, or increased work of breathing. Heart: Normal rate and rhythm. Normal S1 and S2. No gallop, click, or rub. S4 w/o murmur. Abdomen: Bowel sounds normal; abdomen soft and nontender. No masses, organomegaly or hernias noted. Musculoskeletal/extremities: Scoliosis noted of  the thoracic & lumbar spine. No clubbing, cyanosis, edema. Minor DIP OA hand changes.  Nail health  good. Vascular: Carotid, radial artery, dorsalis pedis and dorsalis posterior tibial pulses are full and equal. No bruits  present. Neurologic: Alert and oriented x3. Deep tendon reflexes symmetrical and normal.         Skin: Intact without suspicious lesions or rashes. Lymph: No cervical, axillary  lymphadenopathy present. Psych: Mood and affect are normal. Normally interactive                                                                                         Assessment & Plan:  #1 comprehensive  exam; no acute findings #2 see Problem List with Assessments & Recommendations Plan: see Orders

## 2010-11-28 NOTE — Progress Notes (Signed)
Addended byPecola Lawless on: 11/28/2010 10:32 AM   Modules accepted: Orders

## 2010-11-28 NOTE — Telephone Encounter (Signed)
meds reviewed ; none should cause this. BUN & creat need to be reviewed

## 2010-11-28 NOTE — Telephone Encounter (Signed)
Critical ZOX:[id number] 6.2 will fax result to office as well

## 2010-12-03 NOTE — Telephone Encounter (Signed)
Pt made aware and received labs in mail.

## 2010-12-21 ENCOUNTER — Other Ambulatory Visit: Payer: Self-pay | Admitting: Internal Medicine

## 2010-12-21 DIAGNOSIS — E7800 Pure hypercholesterolemia, unspecified: Secondary | ICD-10-CM

## 2010-12-24 ENCOUNTER — Other Ambulatory Visit (INDEPENDENT_AMBULATORY_CARE_PROVIDER_SITE_OTHER): Payer: Medicare Other

## 2010-12-24 DIAGNOSIS — E7800 Pure hypercholesterolemia, unspecified: Secondary | ICD-10-CM

## 2010-12-24 LAB — POTASSIUM: Potassium: 4.7 mEq/L (ref 3.5–5.1)

## 2010-12-24 NOTE — Progress Notes (Signed)
Labs only

## 2010-12-27 ENCOUNTER — Other Ambulatory Visit: Payer: Self-pay | Admitting: Internal Medicine

## 2011-06-07 DIAGNOSIS — R220 Localized swelling, mass and lump, head: Secondary | ICD-10-CM | POA: Diagnosis not present

## 2011-06-07 DIAGNOSIS — R221 Localized swelling, mass and lump, neck: Secondary | ICD-10-CM | POA: Diagnosis not present

## 2011-06-07 DIAGNOSIS — L578 Other skin changes due to chronic exposure to nonionizing radiation: Secondary | ICD-10-CM | POA: Diagnosis not present

## 2011-06-10 DIAGNOSIS — Z124 Encounter for screening for malignant neoplasm of cervix: Secondary | ICD-10-CM | POA: Diagnosis not present

## 2011-06-17 ENCOUNTER — Encounter: Payer: Self-pay | Admitting: Internal Medicine

## 2011-06-17 ENCOUNTER — Ambulatory Visit (INDEPENDENT_AMBULATORY_CARE_PROVIDER_SITE_OTHER): Payer: Medicare Other | Admitting: Internal Medicine

## 2011-06-17 VITALS — BP 128/80 | Temp 98.1°F | Resp 12 | Ht 61.0 in | Wt 114.0 lb

## 2011-06-17 DIAGNOSIS — Z0000 Encounter for general adult medical examination without abnormal findings: Secondary | ICD-10-CM

## 2011-06-17 DIAGNOSIS — E7800 Pure hypercholesterolemia, unspecified: Secondary | ICD-10-CM

## 2011-06-17 DIAGNOSIS — E559 Vitamin D deficiency, unspecified: Secondary | ICD-10-CM | POA: Diagnosis not present

## 2011-06-17 DIAGNOSIS — K219 Gastro-esophageal reflux disease without esophagitis: Secondary | ICD-10-CM | POA: Diagnosis not present

## 2011-06-17 LAB — CBC WITH DIFFERENTIAL/PLATELET
Basophils Absolute: 0 10*3/uL (ref 0.0–0.1)
Eosinophils Absolute: 0.1 10*3/uL (ref 0.0–0.7)
HCT: 39.5 % (ref 36.0–46.0)
Hemoglobin: 13.4 g/dL (ref 12.0–15.0)
Lymphs Abs: 2.3 10*3/uL (ref 0.7–4.0)
MCHC: 34 g/dL (ref 30.0–36.0)
MCV: 91.2 fl (ref 78.0–100.0)
Monocytes Absolute: 0.6 10*3/uL (ref 0.1–1.0)
Monocytes Relative: 9.6 % (ref 3.0–12.0)
Neutro Abs: 3.2 10*3/uL (ref 1.4–7.7)
Platelets: 252 10*3/uL (ref 150.0–400.0)
RDW: 12.9 % (ref 11.5–14.6)

## 2011-06-17 LAB — LIPID PANEL
Cholesterol: 213 mg/dL — ABNORMAL HIGH (ref 0–200)
Total CHOL/HDL Ratio: 3
Triglycerides: 92 mg/dL (ref 0.0–149.0)
VLDL: 18.4 mg/dL (ref 0.0–40.0)

## 2011-06-17 LAB — BASIC METABOLIC PANEL
BUN: 15 mg/dL (ref 6–23)
CO2: 25 mEq/L (ref 19–32)
Chloride: 101 mEq/L (ref 96–112)
Glucose, Bld: 79 mg/dL (ref 70–99)
Potassium: 4.2 mEq/L (ref 3.5–5.1)
Sodium: 141 mEq/L (ref 135–145)

## 2011-06-17 LAB — HEPATIC FUNCTION PANEL
AST: 25 U/L (ref 0–37)
Albumin: 4.2 g/dL (ref 3.5–5.2)
Total Bilirubin: 0.7 mg/dL (ref 0.3–1.2)

## 2011-06-17 NOTE — Assessment & Plan Note (Signed)
She is essentially asymptomatic; labs will be checked.

## 2011-06-17 NOTE — Progress Notes (Signed)
Subjective:    Patient ID: LUV MISH, female    DOB: 10-Feb-1922, 78 y.o.   MRN: [id number]  HPI Medicare Wellness Visit:  The following psychosocial & medical history were reviewed as required by Medicare.   Social history: caffeine: 1 cup coffee/ day , alcohol: < 7 /week ,  tobacco use : quit 1953 & exercise : walking  7 X/ week< 30 min.   Home & personal  safety / fall risk: no issues, activities of daily living: no limitations , seatbelt use : yes , and smoke alarm employment : yes .  Power of Attorney/Living Will status : inplace  Vision ( as recorded per Nurse) & Hearing  evaluation :  Decreased acuity even with aids.Ophth exam due Orientation :oriented X3 , memory & recall :good,  math testing:good  ,and mood & affect : normal  . Depression / anxiety: denied Travel history : last 2011 [place] , immunization status :PNA due  , transfusion history: with 3rd pregnancy 1956, and preventive health surveillance ( colonoscopies, BMD , etc as per protocol/ [facility]): colonoscopy up to date, Dental care:  Seen every 6 mos . Chart reviewed &  Updated. Active issues reviewed & addressed.       Review of Systems   She states she's had hearing aids checked; newer models were not effective.  She describes rare dysphagia. She denies abdominal pain, rectal bleeding, melena, or unexplained weight loss. She did not remember having had esophageal dilation in 2006     Objective:   Physical Exam Gen.: Thin but  well-nourished in appearance. Alert, appropriate and cooperative throughout exam. Head: Normocephalic without obvious abnormalities Eyes: No corneal or conjunctival inflammation noted. Extraocular motion intact.  Ears: External  ear exam reveals no significant lesions or deformities.  Hearing is grossly normal bilaterally despite aids. Nose: External nasal exam reveals no deformity or inflammation. Nasal mucosa are pink and moist. No lesions or exudates noted.   Mouth: Oral mucosa and  oropharynx reveal no lesions or exudates. Teeth in good repair. Neck: No deformities, masses, or tenderness noted. Range of motion & Thyroid normal Lungs: Normal respiratory effort; chest expands symmetrically. Lungs are clear to auscultation without rales, wheezes, or increased work of breathing. Heart: Normal rate and rhythm. Normal S1 and S2. No gallop, click, or rub. Grade 1/6 systolic murmur. Abdomen: Bowel sounds normal; abdomen soft and nontender. No masses, organomegaly or hernias noted. Genitalia: Dr Marcelle Overlie   .                                                                                   Musculoskeletal/extremities: Scoliosis noted of  the thoracic / lumbar spine. No clubbing, cyanosis, edema, or deformity noted. Range of motion  normal .Tone & strength  normal.Joints ;isolated DIP OA change. Nail health  good. Vascular: Carotid, radial artery, dorsalis pedis and  posterior tibial pulses are full and equal. No bruits present. Neurologic: Alert and oriented x3. Deep tendon reflexes symmetrical and normal.          Skin: Intact without suspicious lesions or rashes. Lymph: No cervical, axillary  lymphadenopathy present. Psych: Mood and affect are normal. Normally interactive  Assessment & Plan:  #1 Medicare Wellness Exam; criteria met ; data entered #2 Problem List reviewed ; Assessment/ Recommendations made Plan: see Orders

## 2011-06-17 NOTE — Patient Instructions (Signed)
Preventive Health Care: Exercise  30-45  minutes a day, 3-4 days a week. Walking is especially valuable in preventing Osteoporosis. Eat a low-fat diet with lots of fruits and vegetables, up to 7-9 servings per day. Consume less than 30 grams of sugar per day from foods & drinks with High Fructose Corn Syrup as # 1,2,3 or #4 on label. The triggers for reflux or "heart burn"  include stress; the "aspirin family" ; alcohol; peppermint; and caffeine (coffee, tea, cola, and chocolate). The aspirin family would include aspirin and the nonsteroidal agents such as ibuprofen &  Naproxen. Tylenol would not cause reflux. If having symptoms ; food & drink should be avoided for @ least 2 hours before going to bed.  Please complete stool cards

## 2011-06-17 NOTE — Assessment & Plan Note (Signed)
Lipid monitoring indicated

## 2011-06-18 LAB — LDL CHOLESTEROL, DIRECT: Direct LDL: 121.4 mg/dL

## 2011-07-01 DIAGNOSIS — C4492 Squamous cell carcinoma of skin, unspecified: Secondary | ICD-10-CM | POA: Diagnosis not present

## 2011-07-01 DIAGNOSIS — L570 Actinic keratosis: Secondary | ICD-10-CM | POA: Diagnosis not present

## 2011-07-01 DIAGNOSIS — Z85828 Personal history of other malignant neoplasm of skin: Secondary | ICD-10-CM | POA: Diagnosis not present

## 2011-07-01 DIAGNOSIS — D485 Neoplasm of uncertain behavior of skin: Secondary | ICD-10-CM | POA: Diagnosis not present

## 2011-07-02 ENCOUNTER — Encounter: Payer: Self-pay | Admitting: Internal Medicine

## 2011-07-03 ENCOUNTER — Other Ambulatory Visit (INDEPENDENT_AMBULATORY_CARE_PROVIDER_SITE_OTHER): Payer: Medicare Other

## 2011-07-03 DIAGNOSIS — Z1211 Encounter for screening for malignant neoplasm of colon: Secondary | ICD-10-CM | POA: Diagnosis not present

## 2011-07-03 LAB — HEMOCCULT GUIAC POC 1CARD (OFFICE)
Card #2 Fecal Occult Blod, POC: NEGATIVE
Card #3 Fecal Occult Blood, POC: NEGATIVE

## 2011-07-16 DIAGNOSIS — Z1231 Encounter for screening mammogram for malignant neoplasm of breast: Secondary | ICD-10-CM | POA: Diagnosis not present

## 2011-07-29 ENCOUNTER — Encounter: Payer: Self-pay | Admitting: Internal Medicine

## 2011-08-06 DIAGNOSIS — D0470 Carcinoma in situ of skin of unspecified lower limb, including hip: Secondary | ICD-10-CM | POA: Diagnosis not present

## 2011-11-26 ENCOUNTER — Other Ambulatory Visit: Payer: Self-pay | Admitting: Internal Medicine

## 2011-12-04 DIAGNOSIS — L570 Actinic keratosis: Secondary | ICD-10-CM | POA: Diagnosis not present

## 2011-12-04 DIAGNOSIS — Z85828 Personal history of other malignant neoplasm of skin: Secondary | ICD-10-CM | POA: Diagnosis not present

## 2011-12-13 ENCOUNTER — Other Ambulatory Visit: Payer: Self-pay | Admitting: Internal Medicine

## 2012-02-09 ENCOUNTER — Other Ambulatory Visit: Payer: Self-pay | Admitting: Internal Medicine

## 2012-03-02 DIAGNOSIS — M899 Disorder of bone, unspecified: Secondary | ICD-10-CM | POA: Diagnosis not present

## 2012-03-02 DIAGNOSIS — N951 Menopausal and female climacteric states: Secondary | ICD-10-CM | POA: Diagnosis not present

## 2012-03-02 DIAGNOSIS — Z13820 Encounter for screening for osteoporosis: Secondary | ICD-10-CM | POA: Diagnosis not present

## 2012-03-02 DIAGNOSIS — M949 Disorder of cartilage, unspecified: Secondary | ICD-10-CM | POA: Diagnosis not present

## 2012-03-17 DIAGNOSIS — Z23 Encounter for immunization: Secondary | ICD-10-CM | POA: Diagnosis not present

## 2012-04-10 ENCOUNTER — Other Ambulatory Visit: Payer: Self-pay | Admitting: Internal Medicine

## 2012-04-10 MED ORDER — ROSUVASTATIN CALCIUM 20 MG PO TABS: ORAL_TABLET | ORAL | Status: DC

## 2012-04-10 NOTE — Telephone Encounter (Signed)
Rx sent 

## 2012-04-10 NOTE — Telephone Encounter (Signed)
refill CRESTOR 20 MG take 1 tablet by mouth ON MONDAY, WEDNESDAY, AND FRIDAY.  APPT DUE --note last OV 1.14.13 wt/lasbx V70--no future appts scheduled

## 2012-04-16 DIAGNOSIS — H2510 Age-related nuclear cataract, unspecified eye: Secondary | ICD-10-CM | POA: Diagnosis not present

## 2012-05-21 ENCOUNTER — Other Ambulatory Visit: Payer: Self-pay | Admitting: Internal Medicine

## 2012-06-06 ENCOUNTER — Other Ambulatory Visit: Payer: Self-pay | Admitting: Internal Medicine

## 2012-07-28 DIAGNOSIS — Z124 Encounter for screening for malignant neoplasm of cervix: Secondary | ICD-10-CM | POA: Diagnosis not present

## 2012-08-21 ENCOUNTER — Other Ambulatory Visit: Payer: Self-pay | Admitting: Internal Medicine

## 2012-09-15 ENCOUNTER — Telehealth: Payer: Self-pay | Admitting: Internal Medicine

## 2012-09-15 ENCOUNTER — Other Ambulatory Visit: Payer: Self-pay | Admitting: Internal Medicine

## 2012-09-15 MED ORDER — ROSUVASTATIN CALCIUM 20 MG PO TABS: ORAL_TABLET | ORAL | Status: DC

## 2012-09-15 MED ORDER — ESOMEPRAZOLE MAGNESIUM 40 MG PO CPDR: DELAYED_RELEASE_CAPSULE | ORAL | Status: DC

## 2012-09-15 NOTE — Telephone Encounter (Signed)
Refill: Crestor tabs 20 mg. 90 day supply  Nexium caps 40 mg. 90 day supply

## 2012-09-15 NOTE — Telephone Encounter (Signed)
RX's sent electronically, patient with pending patient June 2014

## 2012-11-10 ENCOUNTER — Encounter: Payer: Self-pay | Admitting: Internal Medicine

## 2012-11-10 ENCOUNTER — Ambulatory Visit (INDEPENDENT_AMBULATORY_CARE_PROVIDER_SITE_OTHER): Payer: Medicare Other | Admitting: Internal Medicine

## 2012-11-10 VITALS — BP 130/78 | HR 72 | Temp 98.0°F | Resp 12 | Ht 60.5 in | Wt 114.0 lb

## 2012-11-10 DIAGNOSIS — Z23 Encounter for immunization: Secondary | ICD-10-CM | POA: Diagnosis not present

## 2012-11-10 DIAGNOSIS — K219 Gastro-esophageal reflux disease without esophagitis: Secondary | ICD-10-CM | POA: Diagnosis not present

## 2012-11-10 DIAGNOSIS — Z0000 Encounter for general adult medical examination without abnormal findings: Secondary | ICD-10-CM

## 2012-11-10 DIAGNOSIS — E785 Hyperlipidemia, unspecified: Secondary | ICD-10-CM

## 2012-11-10 DIAGNOSIS — E559 Vitamin D deficiency, unspecified: Secondary | ICD-10-CM

## 2012-11-10 LAB — BASIC METABOLIC PANEL
CO2: 29 mEq/L (ref 19–32)
GFR: 58.16 mL/min — ABNORMAL LOW (ref >60.00)
Glucose, Bld: 86 mg/dL (ref 70–99)
Potassium: 4 mEq/L (ref 3.5–5.1)
Sodium: 140 mEq/L (ref 135–145)

## 2012-11-10 LAB — CBC WITH DIFFERENTIAL/PLATELET
Basophils Absolute: 0 10*3/uL (ref 0.0–0.1)
HCT: 37.2 % (ref 36.0–46.0)
Hemoglobin: 12.5 g/dL (ref 12.0–15.0)
Lymphs Abs: 2.5 10*3/uL (ref 0.7–4.0)
MCHC: 33.5 g/dL (ref 30.0–36.0)
Monocytes Relative: 7.4 % (ref 3.0–12.0)
Neutro Abs: 4.4 10*3/uL (ref 1.4–7.7)
RDW: 13.1 % (ref 11.5–14.6)

## 2012-11-10 LAB — HEPATIC FUNCTION PANEL
AST: 24 U/L (ref 0–37)
Albumin: 4 g/dL (ref 3.5–5.2)
Total Protein: 7 g/dL (ref 6.0–8.3)

## 2012-11-10 LAB — LIPID PANEL
Cholesterol: 193 mg/dL (ref 0–200)
VLDL: 16.2 mg/dL (ref 0.0–40.0)

## 2012-11-10 MED ORDER — ESOMEPRAZOLE MAGNESIUM 40 MG PO CPDR: DELAYED_RELEASE_CAPSULE | ORAL | Status: DC

## 2012-11-10 MED ORDER — ROSUVASTATIN CALCIUM 20 MG PO TABS: ORAL_TABLET | ORAL | Status: DC

## 2012-11-10 NOTE — Patient Instructions (Addendum)
Share results with all non [place] medical staff seen  

## 2012-11-10 NOTE — Progress Notes (Signed)
Subjective:    Patient ID: Kylie May, female    DOB: Mar 24, 1922, 79 y.o.   MRN: [id number]  HPI Medicare Wellness Visit:  Psychosocial & medical history were reviewed as required by Medicare (abuse,antisocial behavioral risks,firearm risk).  Social history: caffeine:1-2 cups coffee/day , alcohol: 7 drinks/week  ,  tobacco use: quit 1953   Exercise :  See below Home & personal  safety / fall risk:no Limitations of activities of daily living:no Seatbelt  and smoke alarm use:yes Power of Attorney/Living Will status : in place Ophthalmology exam status :2014 Hearing evaluation status:current (aidsbilaterally) Orientation :oriented X 3 Memory & recall :good Math testing:good Active depression / anxiety:denied Foreign travel history : 2012 [place] Immunization status for Shingles /Flu/ PNA/ tetanus : PNA given today; Shingles needed Transfusion history: post partum 1956 Preventive health surveillance status of colonoscopy, BMD , mammograms,PAP as per protocol/ SOC:she is inquiring about having colonoscopy (age 40 !!!) Dental care: current Chart reviewed &  Updated. Active issues reviewed & addressed.      Review of Systems  She is on a modified  heart healthy diet; she is not  Exercising but is physically active.She denies chest pain, palpitations, dyspnea, or claudication. Family history is positive for premature coronary disease in her daughter @ 20. There is medication compliance with the statin. Significant abdominal symptoms, memory deficit, or myalgias denied. Rare minor dysphagia ; no significant dyspepsia on Nexium. No melena or rectal bleeding reported. .     Objective:   Physical Exam Gen.: Thin but healthy and well-nourished in appearance. Alert, appropriate and cooperative throughout exam.Appears much younger than stated age  Head: Normocephalic without obvious abnormalities  Eyes: No corneal or conjunctival inflammation noted. Ptosis OS Ears:  Hearing is grossly  decreased bilaterally despite aids Nose: External nasal exam reveals no deformity or inflammation. Nasal mucosa are pink and moist. No lesions or exudates noted.   Mouth: Oral mucosa and oropharynx reveal no lesions or exudates. Teeth in good repair. Neck: No deformities, masses, or tenderness noted. Thyroid normal. Lungs: Normal respiratory effort; chest expands symmetrically. Lungs are clear to auscultation without rales, wheezes, or increased work of breathing. Heart: Normal rate and rhythm. Accentuated S1 and S2. No gallop, click, or rub. Grade 1/6 systolic murmur. Abdomen: Bowel sounds normal; abdomen soft and nontender. No masses, organomegaly or hernias noted. Genitalia: As per Gyn                                  Musculoskeletal/extremities: There is some asymmetry of the posterior thoracic musculature with  scoliosis. No clubbing, cyanosis, edema, or significant extremity  deformity noted. Range of motion normal .Tone & strength  Normal. Joints  reveal mild  PIP &  DIP changes. Nail health good. Able to lie down & sit up w/o help. Negative SLR bilaterally past 90 degrees Vascular: Carotid, radial artery, dorsalis pedis and  posterior tibial pulses are full and equal. No bruits present. Neurologic: Alert and oriented x3. Deep tendon reflexes symmetrical and normal.        Skin: Intact without suspicious lesions or rashes.Keratoses Lymph: No cervical, axillary lymphadenopathy present. Psych: Mood and affect are normal. Normally interactive  Assessment & Plan:  #1 Medicare Wellness Exam; criteria met ; data entered #2 Problem List/Diagnoses reviewed Plan:  Assessments made/ Orders entered  

## 2012-11-14 LAB — VITAMIN D 1,25 DIHYDROXY
Vitamin D 1, 25 (OH)2 Total: 37 pg/mL (ref 18–72)
Vitamin D2 1, 25 (OH)2: <8 pg/mL
Vitamin D3 1, 25 (OH)2: 37 pg/mL

## 2012-11-15 ENCOUNTER — Other Ambulatory Visit: Payer: Self-pay | Admitting: Internal Medicine

## 2012-11-15 DIAGNOSIS — E559 Vitamin D deficiency, unspecified: Secondary | ICD-10-CM

## 2012-11-17 ENCOUNTER — Encounter: Payer: Self-pay | Admitting: *Deleted

## 2012-12-09 DIAGNOSIS — Z85828 Personal history of other malignant neoplasm of skin: Secondary | ICD-10-CM | POA: Diagnosis not present

## 2012-12-09 DIAGNOSIS — L570 Actinic keratosis: Secondary | ICD-10-CM | POA: Diagnosis not present

## 2012-12-09 DIAGNOSIS — D485 Neoplasm of uncertain behavior of skin: Secondary | ICD-10-CM | POA: Diagnosis not present

## 2013-03-01 DIAGNOSIS — J309 Allergic rhinitis, unspecified: Secondary | ICD-10-CM | POA: Diagnosis not present

## 2013-03-01 DIAGNOSIS — R05 Cough: Secondary | ICD-10-CM | POA: Diagnosis not present

## 2013-03-11 DIAGNOSIS — R05 Cough: Secondary | ICD-10-CM | POA: Diagnosis not present

## 2013-03-13 DIAGNOSIS — R05 Cough: Secondary | ICD-10-CM | POA: Diagnosis not present

## 2013-06-11 DIAGNOSIS — Z23 Encounter for immunization: Secondary | ICD-10-CM | POA: Diagnosis not present

## 2013-06-17 DIAGNOSIS — Z85828 Personal history of other malignant neoplasm of skin: Secondary | ICD-10-CM | POA: Diagnosis not present

## 2013-06-17 DIAGNOSIS — L570 Actinic keratosis: Secondary | ICD-10-CM | POA: Diagnosis not present

## 2013-10-04 ENCOUNTER — Emergency Department: Payer: Self-pay | Admitting: Emergency Medicine

## 2013-10-04 DIAGNOSIS — S20219A Contusion of unspecified front wall of thorax, initial encounter: Secondary | ICD-10-CM | POA: Diagnosis not present

## 2013-10-04 DIAGNOSIS — R071 Chest pain on breathing: Secondary | ICD-10-CM | POA: Diagnosis not present

## 2013-10-04 DIAGNOSIS — IMO0002 Reserved for concepts with insufficient information to code with codable children: Secondary | ICD-10-CM | POA: Diagnosis not present

## 2013-10-04 DIAGNOSIS — Z79899 Other long term (current) drug therapy: Secondary | ICD-10-CM | POA: Diagnosis not present

## 2013-10-04 DIAGNOSIS — J438 Other emphysema: Secondary | ICD-10-CM | POA: Diagnosis not present

## 2013-10-04 DIAGNOSIS — Z9889 Other specified postprocedural states: Secondary | ICD-10-CM | POA: Diagnosis not present

## 2013-10-04 DIAGNOSIS — Z7901 Long term (current) use of anticoagulants: Secondary | ICD-10-CM | POA: Diagnosis not present

## 2013-10-04 DIAGNOSIS — K219 Gastro-esophageal reflux disease without esophagitis: Secondary | ICD-10-CM | POA: Diagnosis not present

## 2013-10-04 DIAGNOSIS — E785 Hyperlipidemia, unspecified: Secondary | ICD-10-CM | POA: Diagnosis not present

## 2013-10-04 DIAGNOSIS — R079 Chest pain, unspecified: Secondary | ICD-10-CM | POA: Diagnosis not present

## 2013-11-05 ENCOUNTER — Other Ambulatory Visit: Payer: Self-pay | Admitting: Internal Medicine

## 2013-11-11 ENCOUNTER — Encounter: Payer: Medicare Other | Admitting: Internal Medicine

## 2013-11-11 DIAGNOSIS — Z0289 Encounter for other administrative examinations: Secondary | ICD-10-CM

## 2013-11-11 DIAGNOSIS — R4689 Other symptoms and signs involving appearance and behavior: Secondary | ICD-10-CM | POA: Insufficient documentation

## 2013-12-20 DIAGNOSIS — Z01419 Encounter for gynecological examination (general) (routine) without abnormal findings: Secondary | ICD-10-CM | POA: Diagnosis not present

## 2013-12-20 DIAGNOSIS — Z1231 Encounter for screening mammogram for malignant neoplasm of breast: Secondary | ICD-10-CM | POA: Diagnosis not present

## 2013-12-20 LAB — HM MAMMOGRAPHY: HM Mammogram: NORMAL

## 2014-01-11 ENCOUNTER — Ambulatory Visit: Payer: Medicare Other | Admitting: Internal Medicine

## 2014-01-22 ENCOUNTER — Other Ambulatory Visit: Payer: Self-pay | Admitting: Internal Medicine

## 2014-02-11 ENCOUNTER — Other Ambulatory Visit: Payer: Self-pay | Admitting: Internal Medicine

## 2014-02-17 ENCOUNTER — Encounter: Payer: Self-pay | Admitting: Internal Medicine

## 2014-02-17 ENCOUNTER — Ambulatory Visit (INDEPENDENT_AMBULATORY_CARE_PROVIDER_SITE_OTHER): Payer: Medicare Other | Admitting: Internal Medicine

## 2014-02-17 VITALS — BP 110/70 | HR 71 | Temp 97.6°F | Ht 60.5 in | Wt 111.0 lb

## 2014-02-17 DIAGNOSIS — E7800 Pure hypercholesterolemia, unspecified: Secondary | ICD-10-CM | POA: Diagnosis not present

## 2014-02-17 DIAGNOSIS — K222 Esophageal obstruction: Secondary | ICD-10-CM

## 2014-02-17 DIAGNOSIS — Z23 Encounter for immunization: Secondary | ICD-10-CM | POA: Diagnosis not present

## 2014-02-17 DIAGNOSIS — M810 Age-related osteoporosis without current pathological fracture: Secondary | ICD-10-CM | POA: Diagnosis not present

## 2014-02-17 NOTE — Addendum Note (Signed)
Addended by: Despina Hidden on: 02/17/2014 04:44 PM   Modules accepted: Orders

## 2014-02-17 NOTE — Progress Notes (Signed)
Subjective:    Patient ID: Kylie May, female    DOB: 10-24-1921, 80 y.o.   MRN: [id number]  HPI Changing from Dr Linna Darner Lives in [place]  On statin No history of vascular disease  Discussed primary prevention  On PPI No heartburn Has had some rare dysphagia at upper esophagus--food Generally does okay  Walks a fair amount, and up and down her stairs On calcium and vitamin D Gets some low back pressure at times---if she has had a busy day  Current Outpatient Prescriptions on File Prior to Visit  Medication Sig Dispense Refill  . Calcium Carbonate-Vitamin D (CALCIUM 600+D) 600-400 MG-UNIT per tablet Take 1 tablet by mouth 2 (two) times daily.        . CRESTOR 20 MG tablet TAKE 1 TABLET ON MONDAY, WEDNESDAY, AND FRIDAY  36 tablet  0  . NEXIUM 40 MG capsule TAKE 1 CAPSULE EVERY MORNING BEFORE BREAKFAST  90 capsule  0   No current facility-administered medications on file prior to visit.    Allergies  Allergen Reactions  . Metronidazole     weakness    Past Medical History  Diagnosis Date  . Diverticulosis   . Schatzki's ring   . Esophageal stricture 2006  . Hiatal hernia   . Compression fracture of lumbar vertebra   . Femoral bruit     Right  . Fibrocystic breast disease   . Mitral valve prolapse   . Hypercholesterolemia     [place] study LDL goal = < 160  . Acid reflux disease   . Pancreatitis 07-2008    Hospitalized   . Diverticulitis 01-2008    GI [place]  . Skin cancer     facial x 2. [name]  . Osteoporosis     Dr Matthew Saras    Past Surgical History  Procedure Laterality Date  . G3 p4 (twins)    . Esophageal dilation  2006  . Epidural steroids      x1  . Skin cancer excision    . Colonoscopy  2006    Family History  Problem Relation Age of Onset  . Breast cancer Maternal Aunt   . Hepatitis Daughter     C  . Lung disease Mother     lung tumor  . Heart attack Daughter 49    S/P stents  . Diabetes Neg Hx   . Stroke Neg Hx     . Heart attack Sister 64    History   Social History  . Marital Status: Widowed    Spouse Name: N/A    Number of Children: 3  . Years of Education: N/A   Occupational History  . 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 Smoking status: Former Smoker    Quit date: 06/04/1951  . Smokeless tobacco: Never Used     Comment: Smoked 1945-1953 , up to 2 cigarettes/day  . Alcohol Use: 4.2 oz/week    7 Glasses of wine per week  . Drug Use: No  . Sexual Activity: Not on file   Other Topics Concern  . Not on file   Social History Narrative   Has living will    Would want daughter Santiago Glad to be health care POA   Not sure about DNR--definitely doesn't want prolonged life support   Not sure about tube feeds either (but probably not)         Review of Systems Notices  stools are soft or watery sometimes Voids okay---some urgency but just in the morning HOH    Objective:   Physical Exam  Constitutional: She appears well-developed and well-nourished. No distress.  Neck: Normal range of motion. Neck supple. No thyromegaly present.  Cardiovascular: Normal rate, regular rhythm, normal heart sounds and intact distal pulses.  Exam reveals no gallop.   No murmur heard. Pulmonary/Chest: Effort normal and breath sounds normal. No respiratory distress. She has no wheezes. She has no rales.  Abdominal: Soft. There is no tenderness.  Musculoskeletal: She exhibits no edema and no tenderness.  Lymphadenopathy:    She has no cervical adenopathy.  Skin: No rash noted.  Psychiatric: She has a normal mood and affect. Her behavior is normal.          Assessment & Plan:

## 2014-02-17 NOTE — Assessment & Plan Note (Signed)
Has had rare dysphagia but generally controlled Will continue the PPI

## 2014-02-17 NOTE — Patient Instructions (Signed)
Please stop the crestor.

## 2014-02-17 NOTE — Assessment & Plan Note (Signed)
Will continue calcium and vitamin D Discussed regular weight bearing exercise--and weight work for legs

## 2014-02-17 NOTE — Assessment & Plan Note (Signed)
Discussed primary prevention and my recommendation that she not take for primary prevention We will stop this

## 2014-02-17 NOTE — Progress Notes (Signed)
Pre visit review using our clinic review tool, if applicable. No additional management support is needed unless otherwise documented below in the visit note. 

## 2014-02-18 LAB — COMPREHENSIVE METABOLIC PANEL
ALT: 17 U/L (ref 0–35)
AST: 26 U/L (ref 0–37)
Albumin: 4.3 g/dL (ref 3.5–5.2)
Alkaline Phosphatase: 37 U/L — ABNORMAL LOW (ref 39–117)
BILIRUBIN TOTAL: 0.6 mg/dL (ref 0.2–1.2)
BUN: 16 mg/dL (ref 6–23)
CALCIUM: 10 mg/dL (ref 8.4–10.5)
CO2: 27 meq/L (ref 19–32)
Chloride: 104 mEq/L (ref 96–112)
Creatinine, Ser: 1.1 mg/dL (ref 0.4–1.2)
GFR: 50.62 mL/min — AB (ref >60.00)
Glucose, Bld: 92 mg/dL (ref 70–99)
Potassium: 4.5 mEq/L (ref 3.5–5.1)
SODIUM: 139 meq/L (ref 135–145)
TOTAL PROTEIN: 7.5 g/dL (ref 6.0–8.3)

## 2014-02-18 LAB — LIPID PANEL
CHOLESTEROL: 197 mg/dL (ref 0–200)
HDL: 69.9 mg/dL (ref >39.00)
LDL Cholesterol: 108 mg/dL — ABNORMAL HIGH (ref 0–99)
NONHDL: 127.1
TRIGLYCERIDES: 94 mg/dL (ref 0.0–149.0)
Total CHOL/HDL Ratio: 3
VLDL: 18.8 mg/dL (ref 0.0–40.0)

## 2014-02-18 LAB — T4, FREE: FREE T4: 0.84 ng/dL (ref 0.60–1.60)

## 2014-02-18 LAB — CBC WITH DIFFERENTIAL/PLATELET
BASOS ABS: 0.1 10*3/uL (ref 0.0–0.1)
Basophils Relative: 0.9 % (ref 0.0–3.0)
EOS PCT: 2.9 % (ref 0.0–5.0)
Eosinophils Absolute: 0.2 10*3/uL (ref 0.0–0.7)
HCT: 39.4 % (ref 36.0–46.0)
Hemoglobin: 13.1 g/dL (ref 12.0–15.0)
LYMPHS PCT: 33.5 % (ref 12.0–46.0)
Lymphs Abs: 2.7 10*3/uL (ref 0.7–4.0)
MCHC: 33.1 g/dL (ref 30.0–36.0)
MCV: 91.5 fl (ref 78.0–100.0)
MONOS PCT: 10 % (ref 3.0–12.0)
Monocytes Absolute: 0.8 10*3/uL (ref 0.1–1.0)
NEUTROS PCT: 52.7 % (ref 43.0–77.0)
Neutro Abs: 4.3 10*3/uL (ref 1.4–7.7)
PLATELETS: 249 10*3/uL (ref 150.0–400.0)
RBC: 4.3 Mil/uL (ref 3.87–5.11)
RDW: 12.7 % (ref 11.5–15.5)
WBC: 8.2 10*3/uL (ref 4.0–10.5)

## 2014-02-21 ENCOUNTER — Encounter: Payer: Self-pay | Admitting: *Deleted

## 2014-02-22 ENCOUNTER — Telehealth: Payer: Self-pay | Admitting: Internal Medicine

## 2014-02-22 NOTE — Telephone Encounter (Signed)
It is unclear in an otherwise healthy person with elevated cholesterol whether medications are a good idea--that is what I was saying. If she has done well on the medication, and she is more comfortable continuing it, I am fine with that. If she is going to continue, please put it back on her list

## 2014-02-22 NOTE — Telephone Encounter (Signed)
Pt inquiring about if she should really stop taking cholesterol medication as Dr Silvio Pate requested at her last visit (02/17/14) since her labs came back good while she was on the medication. She would like an explanation to help her understand the thought process on this.

## 2014-02-23 NOTE — Telephone Encounter (Signed)
Pt had a hard time hearing me, but I think she understood, she will come by the office.

## 2014-03-22 DIAGNOSIS — H10502 Unspecified blepharoconjunctivitis, left eye: Secondary | ICD-10-CM | POA: Diagnosis not present

## 2014-04-02 ENCOUNTER — Other Ambulatory Visit: Payer: Self-pay | Admitting: Internal Medicine

## 2014-04-08 DIAGNOSIS — H01009 Unspecified blepharitis unspecified eye, unspecified eyelid: Secondary | ICD-10-CM | POA: Diagnosis not present

## 2014-04-24 ENCOUNTER — Other Ambulatory Visit: Payer: Self-pay | Admitting: Internal Medicine

## 2014-05-30 ENCOUNTER — Telehealth: Payer: Self-pay | Admitting: Internal Medicine

## 2014-05-30 NOTE — Telephone Encounter (Signed)
Pt has decided to continue taking her Crestor. If there are any issues with this please give them a call.

## 2014-05-30 NOTE — Telephone Encounter (Signed)
Please let her know that I am really okay with her continuing the crestor. There is clear evidence that it reduces the risk of a heart attack. There are some potential side effects and the cost though---so I like to make sure that patients are still okay with continuing

## 2014-05-31 MED ORDER — ROSUVASTATIN CALCIUM 20 MG PO TABS: 20.0000 mg | ORAL_TABLET | Freq: Every day | ORAL | Status: DC

## 2014-05-31 MED ORDER — ESOMEPRAZOLE MAGNESIUM 40 MG PO CPDR: 40.0000 mg | DELAYED_RELEASE_CAPSULE | Freq: Every day | ORAL | Status: DC

## 2014-05-31 NOTE — Telephone Encounter (Signed)
Spoke with patient and advised results, also spoke with grand daughter rx sent to pharmacy by e-script

## 2014-06-06 ENCOUNTER — Telehealth: Payer: Self-pay

## 2014-06-06 NOTE — Telephone Encounter (Signed)
Pt has appt with Webb Silversmith NP 06/07/14 at 2:15 pm/

## 2014-06-06 NOTE — Telephone Encounter (Signed)
PLEASE NOTE: All timestamps contained within this report are represented as [place] Standard Time. CONFIDENTIALTY NOTICE: This fax transmission is intended only for the addressee. It contains information that is legally privileged, confidential or otherwise protected from use or disclosure. If you are not the intended recipient, you are strictly prohibited from reviewing, disclosing, copying using or disseminating any of this information or taking any action in reliance on or regarding this information. If you have received this fax in error, please notify us immediately by telephone so that we can arrange for its return to [place]. Phone: [phone], Toll-Free: [phone], Fax: [phone] Page: 1 of 2 Call Id: [id number] [place] Patient Name: Kylie May Gender: Female DOB: 05/04/1922 Age: 81 Y 46 M 1 D Return Phone Number: [phone] (Primary), [phone] (Secondary) Address: City/State/Zip: [place] [address] Client [facility] - Client Client Site [facility] Physician Viviana Simpler Contact Type Call Call Type Triage / Clinical Caller Name [name] Relationship To Patient Daughter Return Phone Number [phone] (Secondary) Chief Complaint Leg Swelling And Edema Initial Comment Caller states mother has swollen leg and ankle. PreDisposition Go to ED Nurse Assessment Nurse: Owens Shark, RN, [place] Date/Time Eilene Ghazi Time): 06/06/2014 4:48:48 PM Confirm and document reason for call. If symptomatic, describe symptoms. ---Caller states mother has swollen left leg, foot and ankle. Is hard of hearing so is speaking for her. Has PCP appointment for Wednesday. When she sits, does not sit with her feet elevated. Has the patient traveled out of the country within the last 30 days? ---No Does the patient require triage? ---Yes Related  visit to physician within the last 2 weeks? ---No Does the PT have any chronic conditions? (i.e. diabetes, asthma, etc.) ---Yes List chronic conditions. ---Hard of Hearing, Hiatal Hernia, Acid Reflux, High Cholesterol, GI issues with diarrhea Guidelines Guideline Title Affirmed Question Affirmed Notes Nurse Date/Time Eilene Ghazi Time) Leg Swelling and Edema [1] Thigh, calf, or ankle swelling AND [2] only 1 side Owens Shark, RN, Lyn 06/06/2014 4:54:21 PM Disp. Time Eilene Ghazi Time) Disposition Final User 06/06/2014 3:51:54 PM Attempt made - no message left Owens Shark, RN, Lyn 06/06/2014 5:00:53 PM See Physician within 4 Hours (or PCP triage) Yes Owens Shark, RN, [place] Understands: Yes Disagree/Comply: Comply PLEASE NOTE: All timestamps contained within this report are represented as [place] Standard Time. CONFIDENTIALTY NOTICE: This fax transmission is intended only for the addressee. It contains information that is legally privileged, confidential or otherwise protected from use or disclosure. If you are not the intended recipient, you are strictly prohibited from reviewing, disclosing, copying using or disseminating any of this information or taking any action in reliance on or regarding this information. If you have received this fax in error, please notify us immediately by telephone so that we can arrange for its return to [place]. Phone: [phone], Toll-Free: [phone], Fax: [phone] Page: 2 of 2 Call Id: [id number] Care Advice Given Per Guideline SEE PHYSICIAN WITHIN 4 HOURS (or PCP triage): CARE ADVICE given per Leg Swelling and Edema (Adult) guideline. After Care Instructions Given Call Event Type User Date / Time Description Comments User: Dudley Major, RN Date/Time Eilene Ghazi Time): 06/06/2014 3:51:45 PM Voice message: The mailbox is full and cannot accept messages at this time. User: Dudley Major, RN Date/Time Eilene Ghazi Time): 06/06/2014 4:45:46 PM Person answering the primary phone states he is the  grandson. That the other number is his mother's number. Referrals  [facility] - ED

## 2014-06-07 ENCOUNTER — Ambulatory Visit (INDEPENDENT_AMBULATORY_CARE_PROVIDER_SITE_OTHER): Payer: Medicare Other | Admitting: Internal Medicine

## 2014-06-07 ENCOUNTER — Encounter: Payer: Self-pay | Admitting: Internal Medicine

## 2014-06-07 VITALS — BP 110/66 | HR 86 | Temp 97.5°F | Wt 116.0 lb

## 2014-06-07 DIAGNOSIS — R600 Localized edema: Secondary | ICD-10-CM | POA: Diagnosis not present

## 2014-06-07 NOTE — Patient Instructions (Signed)

## 2014-06-07 NOTE — Progress Notes (Signed)
Subjective:    Patient ID: Kylie May, female    DOB: 1921-09-25, 81 y.o.   MRN: [id number]  HPI  Pt presents to the clinic today with c/o left leg, ankle and foot swelling. She reports this started 1-2 weeks ago. She reports that it is sensitive to touch at times. She has noticed some associated tingling as well. She has never had this before. She has not started or stopped any new medications. She is very concerned that she may have a blood clot in her leg.   Review of Systems      Past Medical History  Diagnosis Date  . Diverticulosis   . Schatzki's ring   . Esophageal stricture 2006  . Hiatal hernia   . Compression fracture of lumbar vertebra   . Femoral bruit     Right  . Fibrocystic breast disease   . Mitral valve prolapse   . Hypercholesterolemia     [place] study LDL goal = < 160  . Acid reflux disease   . Pancreatitis 07-2008    Hospitalized   . Diverticulitis 01-2008    GI [place]  . Skin cancer     facial x 2. [name]  . Osteoporosis     Dr Matthew Saras    Current Outpatient Prescriptions  Medication Sig Dispense Refill  . Calcium Carbonate-Vitamin D (CALCIUM 600+D) 600-400 MG-UNIT per tablet Take 1 tablet by mouth 2 (two) times daily.      [name] esomeprazole (NEXIUM) 40 MG capsule Take 1 capsule (40 mg total) by mouth daily. 90 capsule 3  . rosuvastatin (CRESTOR) 20 MG tablet Take 1 tablet (20 mg total) by mouth daily. 90 tablet 3   No current facility-administered medications for this visit.    Allergies  Allergen Reactions  . Metronidazole     weakness    Family History  Problem Relation Age of Onset  . Breast cancer Maternal Aunt   . Hepatitis Daughter     C  . Lung disease Mother     lung tumor  . Heart attack Daughter 17    S/P stents  . Diabetes Neg Hx   . Stroke Neg Hx   . Heart attack Sister 36    History   Social History  . Marital Status: Widowed    Spouse Name: N/A    Number of Children: 3  . Years of Education: N/A     Occupational History  . 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 Smoking status: Former Smoker    Quit date: 06/04/1951  . Smokeless tobacco: Never Used     Comment: Smoked 1945-1953 , up to 2 cigarettes/day  . Alcohol Use: 4.2 oz/week    7 Glasses of wine per week  . Drug Use: No  . Sexual Activity: Not on file   Other Topics Concern  . Not on file   Social History Narrative   Has living will    Would want daughter Santiago Glad to be health care POA   Not sure about DNR--definitely doesn't want prolonged life support   Not sure about tube feeds either (but probably not)           Constitutional: Denies fever, malaise, fatigue, headache or abrupt weight changes.  Respiratory: Denies difficulty breathing, shortness of breath, cough or sputum production.   Cardiovascular: Pt reports swelling of LLE. Denies chest pain, chest tightness, palpitations or swelling in the hands.  Musculoskeletal: Denies decrease in range of motion, difficulty with gait, muscle pain or joint pain and swelling.  Skin: Denies redness, rashes, lesions or ulcercations.  Neurological: Denies dizziness, difficulty with memory, difficulty with speech or problems with balance and coordination.   No other specific complaints in a complete review of systems (except as listed in HPI above).  Objective:   Physical Exam  BP 110/66 mmHg  Pulse 86  Temp(Src) 97.5 F (36.4 C) (Oral)  Wt 116 lb (52.617 kg)  Wt Readings from Last 3 Encounters:  06/07/14 116 lb (52.617 kg)  02/17/14 111 lb (50.349 kg)  11/10/12 114 lb (51.71 kg)    General: Appears her stated age, well developed, well nourished in NAD. Skin: Warm, dry and intact. No warmth or redness noted. Cardiovascular: Normal rate and rhythm. S1,S2 noted.  No murmur, rubs or gallops noted. Trace nonpitting BLE noted (L>R). Negative Homan sign. Pulmonary/Chest: Normal effort and positive  vesicular breath sounds. No respiratory distress. No wheezes, rales or ronchi noted.  Musculoskeletal: Normal flexion, extension and rotation of the left ankle. No pain with palpation. No difficulty with gait.  Neurological: Alert and oriented. [facility]  BMET    Component Value Date/Time   NA 139 02/17/2014 1632   K 4.5 02/17/2014 1632   CL 104 02/17/2014 1632   CO2 27 02/17/2014 1632   GLUCOSE 92 02/17/2014 1632   BUN 16 02/17/2014 1632   CREATININE 1.1 02/17/2014 1632   CALCIUM 10.0 02/17/2014 1632   GFRNONAA 73 07/26/2008 1004   GFRAA 88 07/26/2008 1004    Lipid Panel     Component Value Date/Time   CHOL 197 02/17/2014 1632   TRIG 94.0 02/17/2014 1632   HDL 69.90 02/17/2014 1632   CHOLHDL 3 02/17/2014 1632   VLDL 18.8 02/17/2014 1632   LDLCALC 108* 02/17/2014 1632    CBC    Component Value Date/Time   WBC 8.2 02/17/2014 1632   RBC 4.30 02/17/2014 1632   HGB 13.1 02/17/2014 1632   HCT 39.4 02/17/2014 1632   PLT 249.0 02/17/2014 1632   MCV 91.5 02/17/2014 1632   MCHC 33.1 02/17/2014 1632   RDW 12.7 02/17/2014 1632   LYMPHSABS 2.7 02/17/2014 1632   MONOABS 0.8 02/17/2014 1632   EOSABS 0.2 02/17/2014 1632   BASOSABS 0.1 02/17/2014 1632    Hgb A1C No results found for: HGBA1C       Assessment & Plan:   Edema, left leg:  Likely dependant Has already improved with elevation No s/s of DVT- reassurance provided Continue elevation, if persist follow up with PCP  RTC as needed or if symptoms persist or worsen

## 2014-06-08 ENCOUNTER — Ambulatory Visit: Payer: Medicare Other | Admitting: Internal Medicine

## 2014-06-09 ENCOUNTER — Inpatient Hospital Stay: Payer: Self-pay | Admitting: Internal Medicine

## 2014-06-09 DIAGNOSIS — I8390 Asymptomatic varicose veins of unspecified lower extremity: Secondary | ICD-10-CM | POA: Diagnosis present

## 2014-06-09 DIAGNOSIS — I4891 Unspecified atrial fibrillation: Secondary | ICD-10-CM | POA: Diagnosis not present

## 2014-06-09 DIAGNOSIS — M810 Age-related osteoporosis without current pathological fracture: Secondary | ICD-10-CM | POA: Diagnosis present

## 2014-06-09 DIAGNOSIS — I480 Paroxysmal atrial fibrillation: Secondary | ICD-10-CM | POA: Diagnosis not present

## 2014-06-09 DIAGNOSIS — Z85828 Personal history of other malignant neoplasm of skin: Secondary | ICD-10-CM | POA: Diagnosis not present

## 2014-06-09 DIAGNOSIS — Z8249 Family history of ischemic heart disease and other diseases of the circulatory system: Secondary | ICD-10-CM | POA: Diagnosis not present

## 2014-06-09 DIAGNOSIS — J90 Pleural effusion, not elsewhere classified: Secondary | ICD-10-CM | POA: Diagnosis not present

## 2014-06-09 DIAGNOSIS — I509 Heart failure, unspecified: Secondary | ICD-10-CM | POA: Diagnosis not present

## 2014-06-09 DIAGNOSIS — M419 Scoliosis, unspecified: Secondary | ICD-10-CM | POA: Diagnosis not present

## 2014-06-09 DIAGNOSIS — E785 Hyperlipidemia, unspecified: Secondary | ICD-10-CM | POA: Diagnosis not present

## 2014-06-09 DIAGNOSIS — K449 Diaphragmatic hernia without obstruction or gangrene: Secondary | ICD-10-CM | POA: Diagnosis present

## 2014-06-09 DIAGNOSIS — R0902 Hypoxemia: Secondary | ICD-10-CM | POA: Diagnosis not present

## 2014-06-09 DIAGNOSIS — I10 Essential (primary) hypertension: Secondary | ICD-10-CM | POA: Diagnosis not present

## 2014-06-09 DIAGNOSIS — I340 Nonrheumatic mitral (valve) insufficiency: Secondary | ICD-10-CM | POA: Diagnosis not present

## 2014-06-09 DIAGNOSIS — R531 Weakness: Secondary | ICD-10-CM | POA: Diagnosis not present

## 2014-06-09 DIAGNOSIS — R000 Tachycardia, unspecified: Secondary | ICD-10-CM | POA: Diagnosis present

## 2014-06-09 DIAGNOSIS — Z79899 Other long term (current) drug therapy: Secondary | ICD-10-CM | POA: Diagnosis not present

## 2014-06-09 DIAGNOSIS — K219 Gastro-esophageal reflux disease without esophagitis: Secondary | ICD-10-CM | POA: Diagnosis not present

## 2014-06-09 DIAGNOSIS — K429 Umbilical hernia without obstruction or gangrene: Secondary | ICD-10-CM | POA: Diagnosis present

## 2014-06-09 DIAGNOSIS — R0989 Other specified symptoms and signs involving the circulatory and respiratory systems: Secondary | ICD-10-CM | POA: Diagnosis not present

## 2014-06-09 DIAGNOSIS — M1990 Unspecified osteoarthritis, unspecified site: Secondary | ICD-10-CM | POA: Diagnosis present

## 2014-06-09 DIAGNOSIS — M8580 Other specified disorders of bone density and structure, unspecified site: Secondary | ICD-10-CM | POA: Diagnosis present

## 2014-06-09 DIAGNOSIS — I5030 Unspecified diastolic (congestive) heart failure: Secondary | ICD-10-CM | POA: Diagnosis not present

## 2014-06-09 DIAGNOSIS — I071 Rheumatic tricuspid insufficiency: Secondary | ICD-10-CM | POA: Diagnosis present

## 2014-06-09 DIAGNOSIS — H9190 Unspecified hearing loss, unspecified ear: Secondary | ICD-10-CM | POA: Diagnosis present

## 2014-06-09 LAB — URINALYSIS, COMPLETE
BACTERIA: NONE SEEN
BILIRUBIN, UR: NEGATIVE
BLOOD: NEGATIVE
Glucose,UR: NEGATIVE mg/dL (ref 0–75)
Ketone: NEGATIVE
Leukocyte Esterase: NEGATIVE
NITRITE: NEGATIVE
Ph: 5 (ref 4.5–8.0)
Protein: NEGATIVE
RBC,UR: <1 /HPF (ref 0–5)
SPECIFIC GRAVITY: 1.005 (ref 1.003–1.030)
SQUAMOUS EPITHELIAL: NONE SEEN
WBC UR: 1 /HPF (ref 0–5)

## 2014-06-09 LAB — CBC
HCT: 40.7 % (ref 35.0–47.0)
HGB: 13.1 g/dL (ref 12.0–16.0)
MCH: 29.8 pg (ref 26.0–34.0)
MCHC: 32.2 g/dL (ref 32.0–36.0)
MCV: 93 fL (ref 80–100)
Platelet: 239 10*3/uL (ref 150–440)
RBC: 4.4 10*6/uL (ref 3.80–5.20)
RDW: 12.6 % (ref 11.5–14.5)
WBC: 9.5 10*3/uL (ref 3.6–11.0)

## 2014-06-09 LAB — BASIC METABOLIC PANEL
ANION GAP: 8 (ref 7–16)
BUN: 9 mg/dL (ref 7–18)
Calcium, Total: 9.3 mg/dL (ref 8.5–10.1)
Chloride: 104 mmol/L (ref 98–107)
Co2: 25 mmol/L (ref 21–32)
Creatinine: 1.15 mg/dL (ref 0.60–1.30)
EGFR (Non-African Amer.): 47 — ABNORMAL LOW
GFR CALC AF AMER: 57 — AB
Glucose: 101 mg/dL — ABNORMAL HIGH (ref 65–99)
Osmolality: 273 (ref 275–301)
Potassium: 3.6 mmol/L (ref 3.5–5.1)
SODIUM: 137 mmol/L (ref 136–145)

## 2014-06-09 LAB — PRO B NATRIURETIC PEPTIDE: B-TYPE NATIURETIC PEPTID: 2602 pg/mL — AB (ref 0–450)

## 2014-06-09 LAB — TROPONIN I: Troponin-I: <0.02 ng/mL

## 2014-06-10 LAB — MAGNESIUM: Magnesium: 1.1 mg/dL — ABNORMAL LOW

## 2014-06-10 LAB — TROPONIN I

## 2014-06-10 LAB — T4, FREE: Free Thyroxine: 1.1 ng/dL (ref 0.76–1.46)

## 2014-06-10 LAB — TSH: Thyroid Stimulating Horm: 8.45 u[IU]/mL — ABNORMAL HIGH

## 2014-06-11 DIAGNOSIS — E785 Hyperlipidemia, unspecified: Secondary | ICD-10-CM

## 2014-06-11 DIAGNOSIS — I340 Nonrheumatic mitral (valve) insufficiency: Secondary | ICD-10-CM

## 2014-06-11 DIAGNOSIS — I480 Paroxysmal atrial fibrillation: Secondary | ICD-10-CM

## 2014-06-11 LAB — MAGNESIUM: Magnesium: 2 mg/dL

## 2014-06-12 LAB — BASIC METABOLIC PANEL
ANION GAP: 6 — AB (ref 7–16)
BUN: 10 mg/dL (ref 7–18)
CHLORIDE: 107 mmol/L (ref 98–107)
CREATININE: 1.07 mg/dL (ref 0.60–1.30)
Calcium, Total: 8.3 mg/dL — ABNORMAL LOW (ref 8.5–10.1)
Co2: 26 mmol/L (ref 21–32)
EGFR (African American): >60
EGFR (Non-African Amer.): 51 — ABNORMAL LOW
GLUCOSE: 86 mg/dL (ref 65–99)
Osmolality: 276 (ref 275–301)
POTASSIUM: 4.3 mmol/L (ref 3.5–5.1)
Sodium: 139 mmol/L (ref 136–145)

## 2014-06-12 LAB — HEMOGLOBIN: HGB: 11.7 g/dL — AB (ref 12.0–16.0)

## 2014-06-14 ENCOUNTER — Ambulatory Visit (INDEPENDENT_AMBULATORY_CARE_PROVIDER_SITE_OTHER): Payer: Medicare Other | Admitting: Physician Assistant

## 2014-06-14 ENCOUNTER — Encounter: Payer: Self-pay | Admitting: *Deleted

## 2014-06-14 ENCOUNTER — Encounter: Payer: Self-pay | Admitting: Physician Assistant

## 2014-06-14 VITALS — BP 110/64 | HR 71 | Ht 60.0 in | Wt 118.5 lb

## 2014-06-14 DIAGNOSIS — I340 Nonrheumatic mitral (valve) insufficiency: Secondary | ICD-10-CM | POA: Diagnosis not present

## 2014-06-14 DIAGNOSIS — I5032 Chronic diastolic (congestive) heart failure: Secondary | ICD-10-CM | POA: Diagnosis not present

## 2014-06-14 DIAGNOSIS — I480 Paroxysmal atrial fibrillation: Secondary | ICD-10-CM | POA: Diagnosis not present

## 2014-06-14 DIAGNOSIS — E7800 Pure hypercholesterolemia, unspecified: Secondary | ICD-10-CM

## 2014-06-14 DIAGNOSIS — I4891 Unspecified atrial fibrillation: Secondary | ICD-10-CM

## 2014-06-14 DIAGNOSIS — K5900 Constipation, unspecified: Secondary | ICD-10-CM

## 2014-06-14 MED ORDER — FUROSEMIDE 20 MG PO TABS: 10.0000 mg | ORAL_TABLET | Freq: Every day | ORAL | Status: DC

## 2014-06-14 MED ORDER — DOCUSATE SODIUM 100 MG PO CAPS: 100.0000 mg | ORAL_CAPSULE | Freq: Every day | ORAL | Status: DC

## 2014-06-14 NOTE — Progress Notes (Addendum)
Patient Name: Kylie May, Kylie May 1921-06-26, MRN [id number]  Date of Encounter: 06/14/2014  Primary Care Provider:  Viviana Simpler, MD Primary Cardiologist:  Dr. Stanford Breed, MD  HPI:  81 year old female with history of recently diagnosed new onset a-fib, otherwise no prior cardiac history. Also with history of HTN, hiatal hernia, osteopenia, degenerative joint disease, and scoliosis who was recently admitted to [facility] 1/7-1/10 for new onset a-fib and mild diastolic CHF.   Patient without prior cardiac history. Beginning after Christmas she developed worsening fatigue as well as lower extremity edema. She denied dyspnea, chest pain, palpitations, or syncope. She was seen at urgent care and noted to have an elevated heart rate and was brought to [facility] for further evaluation. She was found to be in a-fib with RVR. She was treated initially with IV Cardizem, however her BP became soft and was changed to amiodarone gtt. She did convert to NSR on this and was continued on po amiodarone at 400 mg bid at discharge. She remained in NSR for the remainder of her admission. Echo showed EF 55-60%, elevated end-diastolic pressures, dilated LA at 4.3 cm, mildly dilated RA, severe mitral regurgitation, mild aortic sclerosis without stenosis, mod-severe TR. She was placed on apixaban 2.5 mg bid (age 40 and weight 54.3 kg).   She is doing well from her hospitalization. She has not had a BM yet and this has caused some nausea. Appetite still somewhat down. Her energy is not back at baseline. Prior to her hospitalization she was very active and per her family that is with her today she was bouncing off the walls. She now spends time sitting in the chair. No chest pain, SOB, or palpitations. She has not noticed any further LEE. She is taking her medications as directed.    Problem List:   Past Medical History  Diagnosis Date  . Diverticulosis   . Schatzki's ring   . Esophageal stricture 2006  . Hiatal hernia     . Compression fracture of lumbar vertebra   . Femoral bruit     Right  . Fibrocystic breast disease   . Mitral valve prolapse   . Hypercholesterolemia     [place] study LDL goal = < 160  . Acid reflux disease   . Pancreatitis 07-2008    Hospitalized   . Diverticulitis 01-2008    GI [place]  . Skin cancer     facial x 2. [name]  . Osteoporosis     Dr Matthew Saras  . Arrhythmia   . A-fib   . CHF (congestive heart failure)    Past Surgical History  Procedure Laterality Date  . G3 p4 (twins)    . Esophageal dilation  2006  . Epidural steroids      x1  . Skin cancer excision    . Colonoscopy  2006   Family History  Problem Relation Age of Onset  . Breast cancer Maternal Aunt   . Hepatitis Daughter     C  . Lung disease Mother     lung tumor  . Heart attack Daughter 23    S/P stents  . Diabetes Neg Hx   . Stroke Neg Hx   . Heart attack Sister 84     Allergies:  Allergies  Allergen Reactions  . Metronidazole     weakness     Home Medications:  Current Outpatient Prescriptions  Medication Sig Dispense Refill  . amiodarone (PACERONE) 400 MG tablet Take 400 mg by mouth  2 (two) times daily.     [name] apixaban (ELIQUIS) 2.5 MG TABS tablet Take 2.5 mg by mouth 2 (two) times daily.    . Calcium Carbonate-Vitamin D (CALCIUM 600+D) 600-400 MG-UNIT per tablet Take 1 tablet by mouth 2 (two) times daily.      . carvedilol (COREG) 3.125 MG tablet Take 3.125 mg by mouth 2 (two) times daily with a meal.    . esomeprazole (NEXIUM) 40 MG capsule Take 1 capsule (40 mg total) by mouth daily. 90 capsule 3  . magnesium oxide (MAG-OX) 400 MG tablet Take 400 mg by mouth daily.    . rosuvastatin (CRESTOR) 20 MG tablet Take 1 tablet (20 mg total) by mouth daily. 90 tablet 3  . docusate sodium (COLACE) 100 MG capsule Take 1 capsule (100 mg total) by mouth daily. 10 capsule 0  . furosemide (LASIX) 20 MG tablet Take 0.5 tablets (10 mg total) by mouth daily. 2 tablet 0   No current  facility-administered medications for this visit.    Weights: Wt Readings from Last 3 Encounters:  06/14/14 118 lb 8 oz (53.751 kg)  06/07/14 116 lb (52.617 kg)  02/17/14 111 lb (50.349 kg)     Review of Systems:  Constitutional: positive for fatigue. negative for fevers, chills, myalgias, weight changes, and night sweats.  HEENT: negative for rhinorrhea and nasal congestion Cardiovascular: see HPI Respiratory: negative for cough, hemoptysis, or orthopnea Abdominal: positive for nausea and constipation. negative for abdominal pain, vomiting, and diarrhea Dermatologic:negative for rash Musculoskeletal: negative for myalgias Neurologic: negative for headache, paresthesias, or dizziness       Physical Exam:  Blood pressure 110/64, pulse 71, height 5' (1.524 m), weight 118 lb 8 oz (53.751 kg).  General: Pleasant, NAD Psych: Normal affect. Neuro: Alert and oriented X 3. Moves all extremities spontaneously. HEENT: Normal  Neck: Supple without bruits or JVD. Lungs:  Resp regular and unlabored, CTA. Heart: RRR no s3, s4. 2/6 systolic murmur at the apex. Abdomen: Soft, non-tender, non-distended, BS + x 4.  Extremities: No clubbing, cyanosis, trace non-pitting edema left lower extremity to the mid shin.    Accessory Clinical Findings:  EKG - NSR, 71 bpm, low voltage, no significant st/t changes   Assessment & Plan:  1. A-fib:  -New onset as of 06/2013  -Remains in NSR with HR in the 70s today -Continue amiodarone 400 bid for 1 week total, then decrease to 200 mg x 1 week, then 200 mg daily  -She needs follow up TSH/free T4/total T3 (thyroid studies while inpatient demonstrated subclinical hypothyroidism), HFP, and CXR (CXR while inpatient showed small bilateral pleural effusions with bibasilar airspace opacities. Mild vascular congestion) at next follow up visit  -She needs RFP and CBC in 4 weeks  -Continue apixaban 2.5 mg bid (dose decreased 2/2 age and weight), she does not  routinely fall and no history of bleeding. We discussed the risks of bleeding.  -CHADSVASc 4, giving her a 4.0% estimated annual risk of yearly stroke   2. Severe mitral regurgitation:  -Noted on echocardiogram on 06/09/2014. This most likely contributed to her a-fib.  -She is quite independent at age 55 -Medical management at this time with echocardiogram follow up in 3-6 months -Will have [facility] review echo  -Consider referral to CVTS at that time for evaluation of MVR vs [facility] referral for mitral valve clipping, if indicated     3. History of mild diastolic CHF:  -Moving air well -Lasix 10 mg x 3 days -  Likely exacerbated by her tachyarrhythmia  -Check bmet   4. HLD:  -Continue statin  5. Constipation: -Colace 100 mg daily    Christell Faith, PA-C [place], [place] [address] [phone] [place] 06/14/2014, 3:36 PM

## 2014-06-14 NOTE — Telephone Encounter (Signed)
This encounter was created in error - please disregard.

## 2014-06-14 NOTE — Patient Instructions (Addendum)
Your physician has recommended you make the following change in your medication:  Start Lasix 10 mg once daily for 3 days and then stop  Start Colace 100 mg once daily for constipation. This can be purchased over the counter.  Your physician recommends that you have labs today:  Magnesium  BMP   Your physician recommends that you return for lab work in:  4 weeks for a TSH    Your physician recommends that you schedule a follow-up appointment in:  1 month

## 2014-06-15 LAB — BASIC METABOLIC PANEL WITH GFR
BUN/Creatinine Ratio: 11 (ref 11–26)
BUN: 12 mg/dL (ref 10–36)
CO2: 24 mmol/L (ref 18–29)
Calcium: 9.1 mg/dL (ref 8.7–10.3)
Chloride: 101 mmol/L (ref 97–108)
Creatinine, Ser: 1.05 mg/dL — ABNORMAL HIGH (ref 0.57–1.00)
GFR calc Af Amer: 54 mL/min/1.73 — ABNORMAL LOW
GFR calc non Af Amer: 47 mL/min/1.73 — ABNORMAL LOW
Glucose: 109 mg/dL — ABNORMAL HIGH (ref 65–99)
Potassium: 4.7 mmol/L (ref 3.5–5.2)
Sodium: 137 mmol/L (ref 134–144)

## 2014-06-15 LAB — MAGNESIUM: MAGNESIUM: 1.9 mg/dL (ref 1.6–2.3)

## 2014-06-22 ENCOUNTER — Ambulatory Visit (INDEPENDENT_AMBULATORY_CARE_PROVIDER_SITE_OTHER): Payer: Medicare Other | Admitting: Nurse Practitioner

## 2014-06-22 ENCOUNTER — Encounter: Payer: Self-pay | Admitting: Nurse Practitioner

## 2014-06-22 VITALS — BP 128/62 | HR 83 | Temp 97.6°F | Resp 12 | Ht 61.0 in | Wt 111.4 lb

## 2014-06-22 DIAGNOSIS — Z0000 Encounter for general adult medical examination without abnormal findings: Secondary | ICD-10-CM | POA: Diagnosis not present

## 2014-06-22 NOTE — Patient Instructions (Signed)
Continue to follow up with your cardiology group.

## 2014-06-22 NOTE — Progress Notes (Signed)
Subjective:    Patient ID: Kylie May, female    DOB: [birth year], 81 y.o.   MRN: [id number]  HPI  Pt is already established in our system. Last saw Christell Faith, PA-C on 06/14/2014. BMET, Magnesium tested also on that date. She has also been seen by Dr. Silvio Pate and previously Dr. Linna Darner.   1) Health Maintenance-   Diet- Cooks at home  Exercise- up and down stairs   Immunizations- unsure of history  Bone Density- Found documentation from 2012.   Colonoscopy- Endoscopy 2006   Eye Exam- Due   Dental Exam- Every 6 months  HOH- Bilateral hearing aids   2) Chronic Problems-  See's Cardiology   3) Acute Problems-  Denies any at this time.   Review of Systems  Constitutional: Positive for fatigue. Negative for fever, chills and diaphoresis.  Respiratory: Negative for chest tightness, shortness of breath and wheezing.   Cardiovascular: Negative for chest pain, palpitations and leg swelling.  Gastrointestinal: Negative for nausea, vomiting, diarrhea and rectal pain.  Skin: Negative for rash.  Neurological: Negative for dizziness, weakness, numbness and headaches.  Psychiatric/Behavioral: The patient is not nervous/anxious.    Past Medical History  Diagnosis Date  . Diverticulosis   . Schatzki's ring   . Esophageal stricture 2006  . Hiatal hernia   . Compression fracture of lumbar vertebra   . Femoral bruit     Right  . Fibrocystic breast disease   . Mitral valve prolapse   . Hypercholesterolemia     [place] study LDL goal = < 160  . Acid reflux disease   . Pancreatitis 07-2008    Hospitalized   . Diverticulitis 01-2008    GI [place]  . Skin cancer     facial x 2. [name]  . Osteoporosis     Dr Matthew Saras  . Arrhythmia   . A-fib   . Chronic diastolic CHF (congestive heart failure)   . Mitral regurgitation     a. 06/2014 EF 55-60%, elevated end-diastolic pressures, dilated LA at 4.3 cm, mildly dilated RA, severe mitral regurgitation, mild aortic sclerosis without  stenosis, mod-severe TR    History   Social History  . Marital Status: Widowed    Spouse Name: N/A    Number of Children: 3  . Years of Education: N/A   Occupational History  . 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 Smoking status: Former Smoker    Quit date: 06/04/1951  . Smokeless tobacco: Never Used     Comment: Smoked 1945-1953 , up to 2 cigarettes/day  . Alcohol Use: 4.2 oz/week    7 Glasses of wine per week  . Drug Use: No  . Sexual Activity: Not on file   Other Topics Concern  . Not on file   Social History Narrative   Has living will    Would want daughter Santiago Glad to be health care POA   Not sure about DNR--definitely doesn't want prolonged life support   Not sure about tube feeds either (but probably not)          Past Surgical History  Procedure Laterality Date  . G3 p4 (twins)    . Esophageal dilation  2006  . Epidural steroids      x1  . Skin cancer excision    . Colonoscopy  2006    Family History  Problem Relation Age of Onset  . Breast cancer Maternal Aunt   .  Hepatitis Daughter     C  . Lung disease Mother     lung tumor  . Heart attack Daughter 91    S/P stents  . Diabetes Neg Hx   . Stroke Neg Hx   . Heart attack Sister 3    Allergies  Allergen Reactions  . Metronidazole     weakness    Current Outpatient Prescriptions on File Prior to Visit  Medication Sig Dispense Refill  . amiodarone (PACERONE) 400 MG tablet Take 400 mg by mouth 2 (two) times daily.     [name] apixaban (ELIQUIS) 2.5 MG TABS tablet Take 2.5 mg by mouth 2 (two) times daily.    . carvedilol (COREG) 3.125 MG tablet Take 3.125 mg by mouth 2 (two) times daily with a meal.    . esomeprazole (NEXIUM) 40 MG capsule Take 1 capsule (40 mg total) by mouth daily. 90 capsule 3  . magnesium oxide (MAG-OX) 400 MG tablet Take 400 mg by mouth daily.    . rosuvastatin (CRESTOR) 20 MG tablet Take 1 tablet (20 mg total)  by mouth daily. 90 tablet 3  . Calcium Carbonate-Vitamin D (CALCIUM 600+D) 600-400 MG-UNIT per tablet Take 1 tablet by mouth 2 (two) times daily.      [name] docusate sodium (COLACE) 100 MG capsule Take 1 capsule (100 mg total) by mouth daily. (Patient not taking: Reported on 06/22/2014) 10 capsule 0  . furosemide (LASIX) 20 MG tablet Take 0.5 tablets (10 mg total) by mouth daily. (Patient not taking: Reported on 06/22/2014) 2 tablet 0   No current facility-administered medications on file prior to visit.      Objective:   Physical Exam  Constitutional: She is oriented to person, place, and time.  BP 128/62 mmHg  Temp(Src) 97.6 F (36.4 C) (Oral)  Resp 12  Ht 5\' 1"  (1.549 m)  Wt 111 lb 6.4 oz (50.531 kg)  BMI 21.06 kg/m2  Unable to chart Sp02 because of long nails.   Cardiovascular: Normal rate.   Murmur heard. Pulmonary/Chest: Effort normal and breath sounds normal. No respiratory distress. She has no wheezes. She has no rales. She exhibits no tenderness.  Neurological: She is alert and oriented to person, place, and time.  Psychiatric: She has a normal mood and affect. Her behavior is normal. Judgment and thought content normal.       Assessment & Plan:

## 2014-06-24 NOTE — Assessment & Plan Note (Signed)
Stable. Pt is following with current cardiology group for multiple cardiac issues. She ambulates well and is accompanied by her Grandson today. We were unable to get a good reading of her Sp02 due to her long acrylic painted nails. 92% was the best reading. Lungs sound great and no complaints from pt today. Will continue to follow.

## 2014-07-05 ENCOUNTER — Ambulatory Visit (INDEPENDENT_AMBULATORY_CARE_PROVIDER_SITE_OTHER): Payer: Medicare Other | Admitting: Cardiovascular Disease

## 2014-07-05 ENCOUNTER — Encounter: Payer: Self-pay | Admitting: Cardiovascular Disease

## 2014-07-05 ENCOUNTER — Ambulatory Visit: Payer: Medicare Other | Admitting: Physician Assistant

## 2014-07-05 ENCOUNTER — Telehealth: Payer: Self-pay | Admitting: Cardiovascular Disease

## 2014-07-05 ENCOUNTER — Other Ambulatory Visit (INDEPENDENT_AMBULATORY_CARE_PROVIDER_SITE_OTHER): Payer: Medicare Other

## 2014-07-05 VITALS — BP 110/70 | HR 60 | Ht 61.0 in | Wt 106.2 lb

## 2014-07-05 DIAGNOSIS — I4891 Unspecified atrial fibrillation: Secondary | ICD-10-CM

## 2014-07-05 DIAGNOSIS — I5032 Chronic diastolic (congestive) heart failure: Secondary | ICD-10-CM | POA: Diagnosis not present

## 2014-07-05 DIAGNOSIS — I340 Nonrheumatic mitral (valve) insufficiency: Secondary | ICD-10-CM | POA: Diagnosis not present

## 2014-07-05 MED ORDER — MAGNESIUM OXIDE 400 MG PO TABS: 400.0000 mg | ORAL_TABLET | Freq: Every day | ORAL | Status: DC

## 2014-07-05 MED ORDER — APIXABAN 2.5 MG PO TABS: 2.5000 mg | ORAL_TABLET | Freq: Two times a day (BID) | ORAL | Status: DC

## 2014-07-05 MED ORDER — AMIODARONE HCL 400 MG PO TABS: 200.0000 mg | ORAL_TABLET | Freq: Every day | ORAL | Status: DC

## 2014-07-05 MED ORDER — CARVEDILOL 3.125 MG PO TABS: 3.1250 mg | ORAL_TABLET | Freq: Two times a day (BID) | ORAL | Status: DC

## 2014-07-05 NOTE — Assessment & Plan Note (Signed)
I don't hear murmurs by physical exam today. Given age and frail status, I recommend medical therapy.

## 2014-07-05 NOTE — Progress Notes (Signed)
HPI  81 year old female with history of recently diagnosed new onset a-fib, otherwise no prior cardiac history. Also with history of HTN, hiatal hernia, osteopenia, degenerative joint disease, and scoliosis who was recently admitted to [facility] 1/7-1/10 for new onset a-fib and mild diastolic CHF.   Patient without prior cardiac history. Beginning after Christmas she developed worsening fatigue as well as lower extremity edema. She denied dyspnea, chest pain, palpitations, or syncope. She was seen at urgent care and noted to have an elevated heart rate and was brought to [facility] for further evaluation. She was found to be in a-fib with RVR. She was treated initially with IV Cardizem, however her BP became soft and was changed to amiodarone gtt. She did convert to NSR on this and was continued on po amiodarone at 400 mg bid at discharge. She remained in NSR for the remainder of her admission. Echo showed EF 55-60%, elevated end-diastolic pressures, dilated LA at 4.3 cm, mildly dilated RA, severe mitral regurgitation, mild aortic sclerosis without stenosis, mod-severe TR. She was placed on apixaban 2.5 mg bid (age 56 and weight 54.3 kg).  She has been doing reasonably well and denies chest pain. Shortness of breath improved significantly. No palpitations.  Allergies  Allergen Reactions  . Metronidazole     Other reaction(s): Dizziness and giddiness (finding), Other (qualifier value) made things feel like they were vibrating like the bed and floor,also caused mild dizziness weakness     Current Outpatient Prescriptions on File Prior to Visit  Medication Sig Dispense Refill  . esomeprazole (NEXIUM) 40 MG capsule Take 1 capsule (40 mg total) by mouth daily. 90 capsule 3  . rosuvastatin (CRESTOR) 20 MG tablet Take 1 tablet (20 mg total) by mouth daily. 90 tablet 3  . Calcium Carbonate-Vitamin D (CALCIUM 600+D) 600-400 MG-UNIT per tablet Take 1 tablet by mouth 2 (two) times daily.      [name] docusate sodium  (COLACE) 100 MG capsule Take 1 capsule (100 mg total) by mouth daily. (Patient not taking: Reported on 07/05/2014) 10 capsule 0  . furosemide (LASIX) 20 MG tablet Take 0.5 tablets (10 mg total) by mouth daily. (Patient not taking: Reported on 07/05/2014) 2 tablet 0   No current facility-administered medications on file prior to visit.     Past Medical History  Diagnosis Date  . Diverticulosis   . Schatzki's ring   . Esophageal stricture 2006  . Hiatal hernia   . Compression fracture of lumbar vertebra   . Femoral bruit     Right  . Fibrocystic breast disease   . Mitral valve prolapse   . Hypercholesterolemia     F[place] study LDL goal = < 160  . Acid reflux disease   . Pancreatitis 07-2008    Hospitalized   . Diverticulitis 01-2008    GI [place]  . Skin cancer     facial x 2. [name]  . Osteoporosis     Dr Matthew Saras  . Arrhythmia   . A-fib   . Chronic diastolic CHF (congestive heart failure)   . Mitral regurgitation     a. 06/2014 EF 55-60%, elevated end-diastolic pressures, dilated LA at 4.3 cm, mildly dilated RA, severe mitral regurgitation, mild aortic sclerosis without stenosis, mod-severe TR     Past Surgical History  Procedure Laterality Date  . G3 p4 (twins)    . Esophageal dilation  2006  . Epidural steroids      x1  . Skin cancer excision    .  Colonoscopy  2006     Family History  Problem Relation Age of Onset  . Breast cancer Maternal Aunt   . Hepatitis Daughter     C  . Lung disease Mother     lung tumor  . Heart attack Daughter 67    S/P stents  . Diabetes Neg Hx   . Stroke Neg Hx   . Heart attack Sister 79     History   Social History  . Marital Status: Widowed    Spouse Name: N/A    Number of Children: 3  . Years of Education: N/A   Occupational History  . 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 Smoking status: Former Smoker    Quit date: 06/04/1951  . Smokeless  tobacco: Never Used     Comment: Smoked 1945-1953 , up to 2 cigarettes/day  . Alcohol Use: 4.2 oz/week    7 Glasses of wine per week  . Drug Use: No  . Sexual Activity: Not on file   Other Topics Concern  . Not on file   Social History Narrative   Has living will    Would want daughter Santiago Glad to be health care POA   Not sure about DNR--definitely doesn't want prolonged life support   Not sure about tube feeds either (but probably not)            PHYSICAL EXAM   BP 110/70 mmHg  Pulse 60  Ht 5\' 1"  (1.549 m)  Wt 106 lb 4 oz (48.195 kg)  BMI 20.09 kg/m2 Constitutional: She is oriented to person, place, and time. She appears well-developed and well-nourished. No distress.  HENT: No nasal discharge.  Head: Normocephalic and atraumatic.  Eyes: Pupils are equal and round. No discharge.  Neck: Normal range of motion. Neck supple. No JVD present. No thyromegaly present.  Cardiovascular: Normal rate, regular rhythm, normal heart sounds. Exam reveals no gallop and no friction rub. No murmur heard.  Pulmonary/Chest: Effort normal and breath sounds normal. No stridor. No respiratory distress. She has no wheezes. She has no rales. She exhibits no tenderness.  Abdominal: Soft. Bowel sounds are normal. She exhibits no distension. There is no tenderness. There is no rebound and no guarding.  Musculoskeletal: Normal range of motion. She exhibits no edema and no tenderness.  Neurological: She is alert and oriented to person, place, and time. Coordination normal.  Skin: Skin is warm and dry. No rash noted. She is not diaphoretic. No erythema. No pallor.  Psychiatric: She has a normal mood and affect. Her behavior is normal. Judgment and thought content normal.     IWP:[id number]  Rhythm  Low voltage in limb leads.   -Anterior infarct -age undetermined.   ABNORMAL     ASSESSMENT AND PLAN

## 2014-07-05 NOTE — Telephone Encounter (Signed)
Pt wants to change to doctor Gollan. If we can please.   Advised patient to see R.dunn then let us know. Per Elmyra Ricks.

## 2014-07-05 NOTE — Assessment & Plan Note (Signed)
She was on short-term furosemide and she seems to be euvolemic.

## 2014-07-05 NOTE — Patient Instructions (Signed)
Your physician recommends that you schedule a follow-up appointment in:  2 months with Christell Faith PA   Your medications have been refilled as requested

## 2014-07-05 NOTE — Assessment & Plan Note (Signed)
She is maintaining a normal sinus rhythm. Continue treatment with amiodarone. This can likely be decreased to 100 mg once daily if she maintained in sinus rhythm. Continue long-term anticoagulation with Eliquis.

## 2014-07-06 LAB — TSH: TSH: 11.51 u[IU]/mL — ABNORMAL HIGH (ref 0.450–4.500)

## 2014-07-08 ENCOUNTER — Telehealth: Payer: Self-pay

## 2014-07-08 ENCOUNTER — Other Ambulatory Visit: Payer: Self-pay

## 2014-07-08 NOTE — Telephone Encounter (Signed)
Pt daughter called, states since Eliquis wasn't called in in Jan, and was only called in at her appt on Tues, 2/2, insurance is denying the Eliquis, and states pt may have to pay $300 to receive this medication through mail order. Please call to discuss. Pt may need samples to hold her over.

## 2014-07-08 NOTE — Telephone Encounter (Signed)
Spoke with pt daughter and she is aware that all Rx have been sent to [facility] and [facility] as ask at appointment. Spoke with pharmacist and she mentioned that pt would have to pay $300 dollars due to insurance reasons. Pt was signed up for mail order late and what insurance pt has at the time is what was locked in and is unfortunately why she would have to pay out of pocket until she is able to receive mail order. Pt daughter was very upset and felt like we were the ones who should have to contact insurance and reason why she was unable to receive medication before time. Pt is aware that we have many pt's and we are unable to contact insurance and is patient's responsibility and we also are not aware as to when pt is out of medications without notification from pt or pharmacy. Pt will contact insurance to discuss cost matters and will come in to pick up samples until pt is able to receive mail order.

## 2014-07-08 NOTE — Telephone Encounter (Signed)
Pt daughter called has some question regarding pt prescriptions being called to [facility]. Please call. This encounter was created in error - please disregard.

## 2014-07-18 ENCOUNTER — Ambulatory Visit: Payer: Medicare Other | Admitting: Nurse Practitioner

## 2014-07-21 ENCOUNTER — Ambulatory Visit (INDEPENDENT_AMBULATORY_CARE_PROVIDER_SITE_OTHER): Payer: Medicare Other | Admitting: Nurse Practitioner

## 2014-07-21 ENCOUNTER — Encounter: Payer: Self-pay | Admitting: Nurse Practitioner

## 2014-07-21 VITALS — BP 132/70 | HR 60 | Temp 97.4°F | Resp 12 | Ht 63.0 in | Wt 107.8 lb

## 2014-07-21 DIAGNOSIS — R946 Abnormal results of thyroid function studies: Secondary | ICD-10-CM | POA: Diagnosis not present

## 2014-07-21 DIAGNOSIS — R7989 Other specified abnormal findings of blood chemistry: Secondary | ICD-10-CM

## 2014-07-21 LAB — TSH: TSH: 12.8 u[IU]/mL — ABNORMAL HIGH (ref 0.35–4.50)

## 2014-07-21 LAB — T4, FREE: Free T4: 0.83 ng/dL (ref 0.60–1.60)

## 2014-07-21 NOTE — Progress Notes (Signed)
Pre visit review using our clinic review tool, if applicable. No additional management support is needed unless otherwise documented below in the visit note. 

## 2014-07-21 NOTE — Patient Instructions (Signed)
We will let you know about the results.

## 2014-07-24 ENCOUNTER — Other Ambulatory Visit: Payer: Self-pay | Admitting: Nurse Practitioner

## 2014-07-24 DIAGNOSIS — R7989 Other specified abnormal findings of blood chemistry: Secondary | ICD-10-CM

## 2014-07-24 NOTE — Assessment & Plan Note (Signed)
Will obtain TSH and Free T4. If T4 free is normal will not treat at this time and keep following the levels. If free T4 is low, will treat will low dose medication. Pt is not symptomatic.   Update: Lab Results  Component Value Date   TSH 12.80* 07/21/2014  Free T4 on 2/18 was 0.83 in the normal range.   Subclinical hypothyroidism probable. Will not treat at this time and follow.

## 2014-07-24 NOTE — Progress Notes (Signed)
   Subjective:    Patient ID: Kylie May, female    DOB: 18-Jun-1921, 81 y.o.   MRN: [id number]  HPI  Kylie May is a 81 yo female here for a follow up of her thyroid.   1) TSH was taken on 07/05/14 while at the cardiologist. I brought her in to discuss further lab work to see if it is subclinical or needs to be treated. She denies intolerance to cold temperatures, weight gain, change in appetite, or any concerns today.    Review of Systems  Constitutional: Negative for fever, chills, diaphoresis and fatigue.  Respiratory: Negative for chest tightness, shortness of breath and wheezing.   Cardiovascular: Negative for chest pain, palpitations and leg swelling.  Gastrointestinal: Negative for nausea, vomiting, diarrhea and constipation.  Endocrine: Negative for cold intolerance and heat intolerance.  Skin: Negative for rash.  Neurological: Negative for dizziness, weakness, numbness and headaches.  Psychiatric/Behavioral: The patient is not nervous/anxious.        Objective:   Physical Exam  Constitutional: She is oriented to person, place, and time. She appears well-developed and well-nourished. No distress.  BP 132/70 mmHg  Pulse 60  Temp(Src) 97.4 F (36.3 C) (Oral)  Resp 12  Ht 5\' 3"  (1.6 m)  Wt 107 lb 12.8 oz (48.898 kg)  BMI 19.10 kg/m2  SpO2  Cannot obtain Sp02 because of her nails- no other methods to obtain level available today. Pt denies breathing concerns.   HENT:  Head: Normocephalic and atraumatic.  Right Ear: External ear normal.  Left Ear: External ear normal.  Neck: Normal range of motion. Neck supple. No thyromegaly present.  Cardiovascular: Normal rate, regular rhythm, normal heart sounds and intact distal pulses.  Exam reveals no gallop and no friction rub.   No murmur heard. Pulmonary/Chest: Effort normal and breath sounds normal. No respiratory distress. She has no wheezes. She has no rales. She exhibits no tenderness.  Lymphadenopathy:    She has  no cervical adenopathy.  Neurological: She is alert and oriented to person, place, and time. No cranial nerve deficit. She exhibits normal muscle tone. Coordination normal.  Skin: Skin is warm and dry. No rash noted. She is not diaphoretic.  Psychiatric: She has a normal mood and affect. Her behavior is normal. Judgment and thought content normal.       Assessment & Plan:

## 2014-08-01 ENCOUNTER — Telehealth: Payer: Self-pay | Admitting: Nurse Practitioner

## 2014-08-01 ENCOUNTER — Other Ambulatory Visit: Payer: Self-pay | Admitting: Nurse Practitioner

## 2014-08-01 NOTE — Telephone Encounter (Signed)
The patient stopped by the office to bring over her medication list that were updated by her and her daughter. They are located in the NP's box.

## 2014-08-01 NOTE — Telephone Encounter (Signed)
Noted! Thank you

## 2014-08-05 DIAGNOSIS — H2513 Age-related nuclear cataract, bilateral: Secondary | ICD-10-CM | POA: Diagnosis not present

## 2014-08-18 DIAGNOSIS — H02042 Spastic entropion of right lower eyelid: Secondary | ICD-10-CM | POA: Diagnosis not present

## 2014-08-29 ENCOUNTER — Telehealth: Payer: Self-pay | Admitting: Cardiovascular Disease

## 2014-08-29 NOTE — Telephone Encounter (Signed)
Patient previously saw Dr. Fletcher Anon whom recommended 2 month fu with [facility] Butts.  Patient is unable to schedule with Thurmond Butts and would like to see Dr. Rockey Situ.  Per Darrold Span for Fletcher Anon this is ok to switch providers.  Patient was scheduled with Dr. Rockey Situ for 09-22-14 .

## 2014-09-01 ENCOUNTER — Ambulatory Visit: Payer: Medicare Other | Admitting: Cardiovascular Disease

## 2014-09-15 DIAGNOSIS — S3993XA Unspecified injury of pelvis, initial encounter: Secondary | ICD-10-CM | POA: Diagnosis not present

## 2014-09-15 DIAGNOSIS — S32501A Unspecified fracture of right pubis, initial encounter for closed fracture: Secondary | ICD-10-CM | POA: Diagnosis not present

## 2014-09-15 DIAGNOSIS — R262 Difficulty in walking, not elsewhere classified: Secondary | ICD-10-CM | POA: Diagnosis not present

## 2014-09-15 DIAGNOSIS — Z888 Allergy status to other drugs, medicaments and biological substances status: Secondary | ICD-10-CM | POA: Diagnosis not present

## 2014-09-15 DIAGNOSIS — Z79899 Other long term (current) drug therapy: Secondary | ICD-10-CM | POA: Diagnosis not present

## 2014-09-15 DIAGNOSIS — S32591A Other specified fracture of right pubis, initial encounter for closed fracture: Secondary | ICD-10-CM | POA: Diagnosis not present

## 2014-09-15 DIAGNOSIS — I482 Chronic atrial fibrillation: Secondary | ICD-10-CM | POA: Diagnosis not present

## 2014-09-15 DIAGNOSIS — Z8249 Family history of ischemic heart disease and other diseases of the circulatory system: Secondary | ICD-10-CM | POA: Diagnosis not present

## 2014-09-15 DIAGNOSIS — R739 Hyperglycemia, unspecified: Secondary | ICD-10-CM | POA: Diagnosis not present

## 2014-09-15 DIAGNOSIS — R102 Pelvic and perineal pain: Secondary | ICD-10-CM | POA: Diagnosis not present

## 2014-09-15 DIAGNOSIS — H9190 Unspecified hearing loss, unspecified ear: Secondary | ICD-10-CM | POA: Diagnosis not present

## 2014-09-15 DIAGNOSIS — Z7901 Long term (current) use of anticoagulants: Secondary | ICD-10-CM | POA: Diagnosis not present

## 2014-09-15 DIAGNOSIS — D72829 Elevated white blood cell count, unspecified: Secondary | ICD-10-CM | POA: Diagnosis not present

## 2014-09-15 DIAGNOSIS — E785 Hyperlipidemia, unspecified: Secondary | ICD-10-CM | POA: Diagnosis not present

## 2014-09-15 DIAGNOSIS — K219 Gastro-esophageal reflux disease without esophagitis: Secondary | ICD-10-CM | POA: Diagnosis not present

## 2014-09-15 DIAGNOSIS — R001 Bradycardia, unspecified: Secondary | ICD-10-CM | POA: Diagnosis not present

## 2014-09-15 DIAGNOSIS — S32511A Fracture of superior rim of right pubis, initial encounter for closed fracture: Secondary | ICD-10-CM | POA: Diagnosis not present

## 2014-09-15 DIAGNOSIS — S7001XA Contusion of right hip, initial encounter: Secondary | ICD-10-CM | POA: Diagnosis not present

## 2014-09-15 DIAGNOSIS — M810 Age-related osteoporosis without current pathological fracture: Secondary | ICD-10-CM | POA: Diagnosis not present

## 2014-09-15 DIAGNOSIS — N289 Disorder of kidney and ureter, unspecified: Secondary | ICD-10-CM | POA: Diagnosis not present

## 2014-09-16 ENCOUNTER — Observation Stay
Admit: 2014-09-16 | Disposition: A | Discharge status: Home / Self Care | Payer: Self-pay | Attending: Internal Medicine | Admitting: Internal Medicine

## 2014-09-16 DIAGNOSIS — K219 Gastro-esophageal reflux disease without esophagitis: Secondary | ICD-10-CM | POA: Diagnosis not present

## 2014-09-16 DIAGNOSIS — E785 Hyperlipidemia, unspecified: Secondary | ICD-10-CM | POA: Diagnosis not present

## 2014-09-16 DIAGNOSIS — S32501A Unspecified fracture of right pubis, initial encounter for closed fracture: Secondary | ICD-10-CM | POA: Diagnosis not present

## 2014-09-16 DIAGNOSIS — Z7901 Long term (current) use of anticoagulants: Secondary | ICD-10-CM | POA: Diagnosis not present

## 2014-09-16 LAB — CBC
HCT: 39.6 % (ref 35.0–47.0)
HGB: 12.9 g/dL (ref 12.0–16.0)
MCH: 29.6 pg (ref 26.0–34.0)
MCHC: 32.6 g/dL (ref 32.0–36.0)
MCV: 91 fL (ref 80–100)
PLATELETS: 261 10*3/uL (ref 150–440)
RBC: 4.36 10*6/uL (ref 3.80–5.20)
RDW: 15.6 % — AB (ref 11.5–14.5)
WBC: 11.3 10*3/uL — AB (ref 3.6–11.0)

## 2014-09-16 LAB — COMPREHENSIVE METABOLIC PANEL
ALT: 15 U/L
ANION GAP: 5 — AB (ref 7–16)
Albumin: 3.8 g/dL
Albumin: 3.6 g/dL
Alkaline Phosphatase: 40 U/L
Alkaline Phosphatase: 37 U/L — ABNORMAL LOW
Anion Gap: 6 — ABNORMAL LOW (ref 7–16)
BUN: 18 mg/dL
BUN: 19 mg/dL
Bilirubin,Total: 0.6 mg/dL
Bilirubin,Total: 0.6 mg/dL
CALCIUM: 9.1 mg/dL
CHLORIDE: 104 mmol/L
CO2: 26 mmol/L
CO2: 27 mmol/L
CREATININE: 0.91 mg/dL
Calcium, Total: 8.7 mg/dL — ABNORMAL LOW
Chloride: 102 mmol/L
Creatinine: 1.05 mg/dL — ABNORMAL HIGH
EGFR (African American): 54 — ABNORMAL LOW
EGFR (African American): >60
EGFR (Non-African Amer.): 56 — ABNORMAL LOW
GFR CALC NON AF AMER: 47 — AB
Glucose: 165 mg/dL — ABNORMAL HIGH
Glucose: 107 mg/dL — ABNORMAL HIGH
POTASSIUM: 3.8 mmol/L
POTASSIUM: 4 mmol/L
SGOT(AST): 27 U/L
SGOT(AST): 20 U/L
SGPT (ALT): 15 U/L
SODIUM: 135 mmol/L
Sodium: 135 mmol/L
Total Protein: 6.6 g/dL
Total Protein: 6.1 g/dL — ABNORMAL LOW

## 2014-09-16 LAB — PROTIME-INR
INR: 1.2
Prothrombin Time: 15.1 secs — ABNORMAL HIGH

## 2014-09-16 LAB — URINALYSIS, COMPLETE
BACTERIA: NONE SEEN
Bilirubin,UR: NEGATIVE
Blood: NEGATIVE
GLUCOSE, UR: NEGATIVE mg/dL (ref 0–75)
Ketone: NEGATIVE
Leukocyte Esterase: NEGATIVE
Nitrite: NEGATIVE
Ph: 6 (ref 4.5–8.0)
Protein: NEGATIVE
SPECIFIC GRAVITY: 1.016 (ref 1.003–1.030)

## 2014-09-16 LAB — TROPONIN I

## 2014-09-16 LAB — CK TOTAL AND CKMB (NOT AT ARMC)
CK, TOTAL: 42 U/L
CK-MB: 1.9 ng/mL

## 2014-09-17 DIAGNOSIS — R079 Chest pain, unspecified: Secondary | ICD-10-CM | POA: Diagnosis not present

## 2014-09-17 DIAGNOSIS — S32501A Unspecified fracture of right pubis, initial encounter for closed fracture: Secondary | ICD-10-CM | POA: Diagnosis not present

## 2014-09-17 DIAGNOSIS — M810 Age-related osteoporosis without current pathological fracture: Secondary | ICD-10-CM | POA: Diagnosis not present

## 2014-09-17 DIAGNOSIS — N289 Disorder of kidney and ureter, unspecified: Secondary | ICD-10-CM | POA: Diagnosis not present

## 2014-09-17 LAB — HEMOGLOBIN: HGB: 11.6 g/dL — AB (ref 12.0–16.0)

## 2014-09-18 DIAGNOSIS — I4891 Unspecified atrial fibrillation: Secondary | ICD-10-CM | POA: Diagnosis not present

## 2014-09-18 DIAGNOSIS — H9193 Unspecified hearing loss, bilateral: Secondary | ICD-10-CM | POA: Diagnosis not present

## 2014-09-18 DIAGNOSIS — Z7901 Long term (current) use of anticoagulants: Secondary | ICD-10-CM | POA: Diagnosis not present

## 2014-09-18 DIAGNOSIS — S3282XD Multiple fractures of pelvis without disruption of pelvic ring, subsequent encounter for fracture with routine healing: Secondary | ICD-10-CM | POA: Diagnosis not present

## 2014-09-18 DIAGNOSIS — W109XXD Fall (on) (from) unspecified stairs and steps, subsequent encounter: Secondary | ICD-10-CM | POA: Diagnosis not present

## 2014-09-18 DIAGNOSIS — M810 Age-related osteoporosis without current pathological fracture: Secondary | ICD-10-CM | POA: Diagnosis not present

## 2014-09-19 DIAGNOSIS — I4891 Unspecified atrial fibrillation: Secondary | ICD-10-CM | POA: Diagnosis not present

## 2014-09-19 DIAGNOSIS — H9193 Unspecified hearing loss, bilateral: Secondary | ICD-10-CM | POA: Diagnosis not present

## 2014-09-19 DIAGNOSIS — Z7901 Long term (current) use of anticoagulants: Secondary | ICD-10-CM | POA: Diagnosis not present

## 2014-09-19 DIAGNOSIS — M810 Age-related osteoporosis without current pathological fracture: Secondary | ICD-10-CM | POA: Diagnosis not present

## 2014-09-19 DIAGNOSIS — W109XXD Fall (on) (from) unspecified stairs and steps, subsequent encounter: Secondary | ICD-10-CM | POA: Diagnosis not present

## 2014-09-19 DIAGNOSIS — S3282XD Multiple fractures of pelvis without disruption of pelvic ring, subsequent encounter for fracture with routine healing: Secondary | ICD-10-CM | POA: Diagnosis not present

## 2014-09-21 DIAGNOSIS — I4891 Unspecified atrial fibrillation: Secondary | ICD-10-CM | POA: Diagnosis not present

## 2014-09-21 DIAGNOSIS — M810 Age-related osteoporosis without current pathological fracture: Secondary | ICD-10-CM | POA: Diagnosis not present

## 2014-09-21 DIAGNOSIS — W109XXD Fall (on) (from) unspecified stairs and steps, subsequent encounter: Secondary | ICD-10-CM | POA: Diagnosis not present

## 2014-09-21 DIAGNOSIS — S3282XD Multiple fractures of pelvis without disruption of pelvic ring, subsequent encounter for fracture with routine healing: Secondary | ICD-10-CM | POA: Diagnosis not present

## 2014-09-21 DIAGNOSIS — Z7901 Long term (current) use of anticoagulants: Secondary | ICD-10-CM | POA: Diagnosis not present

## 2014-09-21 DIAGNOSIS — H9193 Unspecified hearing loss, bilateral: Secondary | ICD-10-CM | POA: Diagnosis not present

## 2014-09-22 ENCOUNTER — Encounter: Payer: Self-pay | Admitting: Cardiovascular Disease

## 2014-09-22 ENCOUNTER — Ambulatory Visit (INDEPENDENT_AMBULATORY_CARE_PROVIDER_SITE_OTHER): Payer: Medicare Other | Admitting: Cardiovascular Disease

## 2014-09-22 VITALS — BP 120/68 | HR 60 | Ht 60.0 in | Wt 107.2 lb

## 2014-09-22 DIAGNOSIS — E039 Hypothyroidism, unspecified: Secondary | ICD-10-CM

## 2014-09-22 DIAGNOSIS — S329XXS Fracture of unspecified parts of lumbosacral spine and pelvis, sequela: Secondary | ICD-10-CM

## 2014-09-22 DIAGNOSIS — E7800 Pure hypercholesterolemia, unspecified: Secondary | ICD-10-CM

## 2014-09-22 DIAGNOSIS — R5383 Other fatigue: Secondary | ICD-10-CM | POA: Diagnosis not present

## 2014-09-22 DIAGNOSIS — R7989 Other specified abnormal findings of blood chemistry: Secondary | ICD-10-CM | POA: Diagnosis not present

## 2014-09-22 DIAGNOSIS — I5032 Chronic diastolic (congestive) heart failure: Secondary | ICD-10-CM

## 2014-09-22 DIAGNOSIS — I4891 Unspecified atrial fibrillation: Secondary | ICD-10-CM

## 2014-09-22 DIAGNOSIS — R946 Abnormal results of thyroid function studies: Secondary | ICD-10-CM

## 2014-09-22 NOTE — Assessment & Plan Note (Signed)
Maintaining normal sinus rhythm. No medication changes made We will check her TSH again today. May need to hold the amiodarone if this continues to run high

## 2014-09-22 NOTE — Patient Instructions (Signed)
You are doing well.  Ok to stop eliquis for 4 days before the eye surgery, then restart 2 days after the surgery  We will check your thyroid labs today  Please call us if you have new issues that need to be addressed before your next appt.  Your physician wants you to follow-up in: 6 months.  You will receive a reminder letter in the mail two months in advance. If you don't receive a letter, please call our office to schedule the follow-up appointment.

## 2014-09-22 NOTE — Assessment & Plan Note (Signed)
Recent fall with pelvic fracture, walking with a walker. She does continue to have positional pain

## 2014-09-22 NOTE — Assessment & Plan Note (Signed)
She appears relatively euvolemic on today's visit. No edema, lungs clear

## 2014-09-22 NOTE — Assessment & Plan Note (Signed)
She has indicated she would like to stay on Crestor 20 mg 3 days per week.

## 2014-09-22 NOTE — Assessment & Plan Note (Signed)
TSH will be drawn today

## 2014-09-22 NOTE — Progress Notes (Signed)
Patient ID: Kylie May, female    DOB: February 06, 1922, 81 y.o.   MRN: [id number]  HPI Comments: 81 year old female with history of paroxysmal asymptomatic  a-fib, otherwise no prior cardiac history. Also with history of HTN, hiatal hernia, osteopenia, degenerative joint disease, and scoliosis who was recently admitted to [facility] 1/7-1/10/16 for new onset a-fib and mild diastolic CHF, who presents today for follow-up of her atrial fibrillation.  She presents with her daughter. Patient had a fall last week, missed a step and fell onto her right side, suffered pelvic fracture in 2 areas per the patient. She presents today with a walker.  She is not needing much pain medication. She denies any tachycardia or palpitations concerning for atrial fibrillation  TSH in February 2016 climbed from normal range up to 12. She is scheduled for repeat lab work and follow-up with primary care to discuss  EKG on today's visit shows normal sinus rhythm with rate 60 bpm, no significant ST or T-wave changes  Other past medical history Echocardiogram 06/09/2014 shows normal LV function, severely dilated left atrium, severe MR, moderate to severe TR  Beginning after Christmas 2015 she developed worsening fatigue as well as lower extremity edema.  She was seen at urgent care and noted to have an elevated heart rate and was brought to [facility] for further evaluation.  She was found to be in a-fib with RVR. She was treated initially with IV Cardizem, however her BP became soft and was changed to amiodarone gtt. She did convert to NSR and was continued on po amiodarone at 400 mg bid at discharge.  She was placed on apixaban 2.5 mg bid (age 36 and weight 54.3 kg).    Allergies  Allergen Reactions  . Metronidazole     Other reaction(s): Dizziness and giddiness (finding), Other (qualifier value) made things feel like they were vibrating like the bed and floor,also caused mild dizziness weakness    Outpatient Encounter  Prescriptions as of 09/22/2014  Medication Sig  . amiodarone (PACERONE) 200 MG tablet Take 200 mg by mouth daily.  [name] apixaban (ELIQUIS) 2.5 MG TABS tablet Take 1 tablet (2.5 mg total) by mouth 2 (two) times daily.  . carvedilol (COREG) 3.125 MG tablet Take 1 tablet (3.125 mg total) by mouth 2 (two) times daily with a meal.  . esomeprazole (NEXIUM) 40 MG capsule Take 1 capsule (40 mg total) by mouth daily.  . magnesium oxide (MAG-OX) 400 MG tablet Take 1 tablet (400 mg total) by mouth daily.  . rosuvastatin (CRESTOR) 20 MG tablet Take 1 tablet (20 mg total) by mouth daily.  . [DISCONTINUED] amiodarone (PACERONE) 400 MG tablet Take 0.5 tablets (200 mg total) by mouth daily. (Patient not taking: Reported on 09/22/2014)    Past Medical History  Diagnosis Date  . Diverticulosis   . Schatzki's ring   . Esophageal stricture 2006  . Hiatal hernia   . Compression fracture of lumbar vertebra   . Femoral bruit     Right  . Fibrocystic breast disease   . Mitral valve prolapse   . Hypercholesterolemia     [place] study LDL goal = < 160  . Acid reflux disease   . Pancreatitis 07-2008    Hospitalized   . Diverticulitis 01-2008    [facility]  . Skin cancer     facial x 2. [name]  . Osteoporosis     Dr Matthew Saras  . Arrhythmia   . A-fib   . Chronic diastolic  CHF (congestive heart failure)   . Mitral regurgitation     a. 06/2014 EF 55-60%, elevated end-diastolic pressures, dilated LA at 4.3 cm, mildly dilated RA, severe mitral regurgitation, mild aortic sclerosis without stenosis, mod-severe TR    Past Surgical History  Procedure Laterality Date  . G3 p4 (twins)    . Esophageal dilation  2006  . Epidural steroids      x1  . Skin cancer excision    . Colonoscopy  2006    Social History  reports that she quit smoking about 63 years ago. She has never used smokeless tobacco. She reports that she drinks about 4.2 oz of alcohol per week. She reports that she does not use illicit  drugs.  Family History family history includes Breast cancer in her maternal aunt; Heart attack (age of onset: 48) in her daughter; Heart attack (age of onset: 83) in her sister; Hepatitis in her daughter; Lung disease in her mother. There is no history of Diabetes or Stroke.  Review of Systems  Constitutional: Negative.   Respiratory: Negative.   Cardiovascular: Negative.   Gastrointestinal: Negative.   Musculoskeletal: Negative.   Skin: Negative.   Neurological: Negative.   Hematological: Negative.   Psychiatric/Behavioral: Negative.   All other systems reviewed and are negative.   BP 120/68 mmHg  Pulse 60  Ht 5' (1.524 m)  Wt 107 lb 4 oz (48.648 kg)  BMI 20.95 kg/m2  Physical Exam  Constitutional: She is oriented to person, place, and time. She appears well-developed and well-nourished.  HENT:  Head: Normocephalic.  Nose: Nose normal.  Mouth/Throat: Oropharynx is clear and moist.  Eyes: Conjunctivae are normal. Pupils are equal, round, and reactive to light.  Neck: Normal range of motion. Neck supple. No JVD present.  Cardiovascular: Normal rate, regular rhythm, S1 normal, S2 normal, normal heart sounds and intact distal pulses.  Exam reveals no gallop and no friction rub.   No murmur heard. Pulmonary/Chest: Effort normal and breath sounds normal. No respiratory distress. She has no wheezes. She has no rales. She exhibits no tenderness.  Abdominal: Soft. Bowel sounds are normal. She exhibits no distension. There is no tenderness.  Musculoskeletal: Normal range of motion. She exhibits no edema or tenderness.  Lymphadenopathy:    She has no cervical adenopathy.  Neurological: She is alert and oriented to person, place, and time. Coordination normal.  Skin: Skin is warm and dry. No rash noted. No erythema.  Psychiatric: She has a normal mood and affect. Her behavior is normal. Judgment and thought content normal.    Assessment and Plan  Nursing note and vitals  reviewed.

## 2014-09-23 LAB — TSH: TSH: 19.47 u[IU]/mL — ABNORMAL HIGH (ref 0.450–4.500)

## 2014-09-23 LAB — T4, FREE: Free T4: 0.85 ng/dL (ref 0.82–1.77)

## 2014-09-23 LAB — T3: T3, Total: 91 ng/dL (ref 71–180)

## 2014-09-26 ENCOUNTER — Other Ambulatory Visit: Payer: Self-pay

## 2014-09-26 DIAGNOSIS — M810 Age-related osteoporosis without current pathological fracture: Secondary | ICD-10-CM | POA: Diagnosis not present

## 2014-09-26 DIAGNOSIS — Z7901 Long term (current) use of anticoagulants: Secondary | ICD-10-CM | POA: Diagnosis not present

## 2014-09-26 DIAGNOSIS — W109XXD Fall (on) (from) unspecified stairs and steps, subsequent encounter: Secondary | ICD-10-CM | POA: Diagnosis not present

## 2014-09-26 DIAGNOSIS — S3282XD Multiple fractures of pelvis without disruption of pelvic ring, subsequent encounter for fracture with routine healing: Secondary | ICD-10-CM | POA: Diagnosis not present

## 2014-09-26 DIAGNOSIS — I4891 Unspecified atrial fibrillation: Secondary | ICD-10-CM | POA: Diagnosis not present

## 2014-09-26 DIAGNOSIS — H9193 Unspecified hearing loss, bilateral: Secondary | ICD-10-CM | POA: Diagnosis not present

## 2014-09-26 MED ORDER — FLECAINIDE ACETATE 50 MG PO TABS: 50.0000 mg | ORAL_TABLET | Freq: Two times a day (BID) | ORAL | Status: DC

## 2014-09-28 ENCOUNTER — Telehealth: Payer: Self-pay | Admitting: *Deleted

## 2014-09-28 ENCOUNTER — Telehealth: Payer: Self-pay | Admitting: Cardiovascular Disease

## 2014-09-28 DIAGNOSIS — W109XXD Fall (on) (from) unspecified stairs and steps, subsequent encounter: Secondary | ICD-10-CM | POA: Diagnosis not present

## 2014-09-28 DIAGNOSIS — I4891 Unspecified atrial fibrillation: Secondary | ICD-10-CM | POA: Diagnosis not present

## 2014-09-28 DIAGNOSIS — M810 Age-related osteoporosis without current pathological fracture: Secondary | ICD-10-CM | POA: Diagnosis not present

## 2014-09-28 DIAGNOSIS — S3282XD Multiple fractures of pelvis without disruption of pelvic ring, subsequent encounter for fracture with routine healing: Secondary | ICD-10-CM | POA: Diagnosis not present

## 2014-09-28 DIAGNOSIS — H9193 Unspecified hearing loss, bilateral: Secondary | ICD-10-CM | POA: Diagnosis not present

## 2014-09-28 DIAGNOSIS — Z7901 Long term (current) use of anticoagulants: Secondary | ICD-10-CM | POA: Diagnosis not present

## 2014-09-28 MED ORDER — FLECAINIDE ACETATE 50 MG PO TABS
50.0000 mg | ORAL_TABLET | Freq: Two times a day (BID) | ORAL | Status: DC
Start: 2014-09-28 — End: 2015-10-02

## 2014-09-28 NOTE — Telephone Encounter (Signed)
Pt daughter calling stating she does not want to start something without it being "monitored"  She did her own research, and she just had more questions. Also wants to make sure there will be no harm if she started the medication.  Please advise.

## 2014-09-28 NOTE — Telephone Encounter (Signed)
Please see previous phone note.  

## 2014-09-28 NOTE — Telephone Encounter (Signed)
Pt daughter calling back asking since she talked to the nurse she then called her mother (the patient)  And pt already took her medication that she was not to take.  Just wants to make sure she is "not in any danger" since she took it Please advise.

## 2014-09-28 NOTE — Telephone Encounter (Signed)
Spoke w/ pt's daughter. She states that the "cleaning lady" took my message yesterday. Advised her that the person I spoke w/ identified herself as pt's daughter.  She states that she did some research on the internet and read that flecainide has to be started in the hospital under close supervision.  Advised her that per Dr. Rockey Situ, pt will be on a small dose and there is no need for hospitalization.  Advised her to stop the amiodarone, as this is affecting pt's thyroid.  She verbalizes understanding, though she does have several more questions about pt's thyroid. Advised her to speak w/ pt's PCP about this.  She asks about the various side effects of amiodarone and if this could affect pt's vision. Advised her to speak w/ pt's ophthalmologist, as pt is sched for upcoming eye surgery.  Spent a significant amount of time discussing pt's meds, potential side effects and going over Dr. Donivan Scull recommendations w/ pt's daughter.  Advised her to speak w/ pharmacist about some of her questions regarding flecainide, as she states "there is a lot of information that I read on the internet and I want to go over it all". She states that she will have pt stop amiodarone and remove it from pt's home.   She will have pt start flecainide tomorrow or Friday, as they have not picked this rx up yet and it has not arrived from [facility].  She is appreciative and will call back w/ any further questions or concerns.

## 2014-09-28 NOTE — Telephone Encounter (Signed)
I would make sure the patient has a routine follow-up within the next several weeks to make sure there is no side effects from the new medication and to make sure we are controlling her atrial fibrillation.

## 2014-09-28 NOTE — Telephone Encounter (Signed)
[facility]  [facility] day supply sent to [facility].

## 2014-09-28 NOTE — Telephone Encounter (Signed)
Jonette Eva at 09/28/2014 10:18 AM     Status: Signed       Expand All Collapse All   Pt daughter calling back asking since she talked to the nurse she then called her mother (the patient)  And pt already took her medication that she was not to take.  Just wants to make sure she is "not in any danger" since she took it Please advise.

## 2014-09-28 NOTE — Telephone Encounter (Signed)
Daughter on DPR Zackery Barefoot called with concerns about a medication change.  (someone called and spoke with patient and or non family and daughter is caregiver)  She is concerned that switching from Amiodarone to flecainide may be unsafe -read that flecainide should be administered under close monitored conditions ie. Hospital  Patient had abnormal TSH result is understood to be the reason for the change  Daughter stated also that [facility] did not receive the RX and patient does not know what to take Today

## 2014-09-29 ENCOUNTER — Telehealth: Payer: Self-pay | Admitting: Cardiovascular Disease

## 2014-09-29 NOTE — Telephone Encounter (Signed)
Pt sched to see Dr. Rockey Situ 10/19/14.

## 2014-09-29 NOTE — Telephone Encounter (Signed)
Grandson called to say patient asked him to call office as she could not understand the person she was talking to.  No phone note in epic for today.   Not sure who called patient.

## 2014-09-29 NOTE — Telephone Encounter (Signed)
Please see previous phone note.  

## 2014-09-30 DIAGNOSIS — Z7901 Long term (current) use of anticoagulants: Secondary | ICD-10-CM | POA: Diagnosis not present

## 2014-09-30 DIAGNOSIS — S3282XD Multiple fractures of pelvis without disruption of pelvic ring, subsequent encounter for fracture with routine healing: Secondary | ICD-10-CM | POA: Diagnosis not present

## 2014-09-30 DIAGNOSIS — W109XXD Fall (on) (from) unspecified stairs and steps, subsequent encounter: Secondary | ICD-10-CM | POA: Diagnosis not present

## 2014-09-30 DIAGNOSIS — M810 Age-related osteoporosis without current pathological fracture: Secondary | ICD-10-CM | POA: Diagnosis not present

## 2014-09-30 DIAGNOSIS — I4891 Unspecified atrial fibrillation: Secondary | ICD-10-CM | POA: Diagnosis not present

## 2014-09-30 DIAGNOSIS — H9193 Unspecified hearing loss, bilateral: Secondary | ICD-10-CM | POA: Diagnosis not present

## 2014-10-02 NOTE — Discharge Summary (Signed)
PATIENT NAME:  Kylie May, Kylie May MR#:  [id number] DATE OF BIRTH:  November 11, 1921  DATE OF ADMISSION:  09/16/2014 DATE OF DISCHARGE:  09/17/2014  ADMITTING DIAGNOSIS:   Pubic fracture.   DISCHARGE DIAGNOSES: 1. Closed right upper superior as well as inferior pubic rami fracture, initial encounter.  2. Intermittent chest pain in the hospital.  3. Osteoporosis.  4. Renal insufficiency.  5. Hyperglycemia.  6. Leukocytosis.  7. History of atrial fibrillation in sinus rhythm now, on chronic anticoagulation.  8. Hyperlipidemia.  9. Gastroesophageal reflux disease.  10. Hard of hearing.   DISCHARGE CONDITION: Stable.   DISCHARGE MEDICATIONS: The patient is to resume her outpatient medications which are:  1. Crestor 20 mg 3 times a week.  2. Nexium 40 mg p.o. daily.  3. Amiodarone 400 mg p.o. twice daily for seven days then once daily to continue her prior schedule.  4. Eliquis 2.5 mg twice daily.  5. Carvedilol 3.125 mg twice daily.  6. Magnesium oxide 400 mg p.o. once daily.  7. Ocular lubricant one drop to each affected eye every hour as needed.  8. Rolling walker 1-2 times daily to walk.  9. Acetaminophen and hydrocodone 325 mg over 5 mg 1 tablet every 4 hours as needed.   HOME OXYGEN: None.   DIET: Two gram salt, low fat, low cholesterol, regular consistency.   ACTIVITY LIMITATIONS: As tolerated.   REFERRAL: To home health physical therapy.   FOLLOWUP APPOINTMENTS:  1. With Viviana Simpler, MD, in 2 days after discharge.  2. Hessie Knows, MD, in 1-2 weeks after discharge.   CONSULTANTS: Hessie Knows, MD, case management, social work, physical therapy.   RADIOLOGIC STUDIES: AP of pelvis 09/15/2014, revealed probable fractures involving  right superior as well as inferior pubic rami of indeterminate age. CT scan may be performed for further evaluation. CT of pelvis without contrast 09/15/2014 revealing nondisplaced right superior as well as inferior pubic ramus fractures, remote  right pubic body fracture.   HISTORY OF PRESENT ILLNESS:  The patient is a 81 year old Caucasian female with history of gastroesophageal reflux disease, osteoporosis, history of atrial fibrillation on anticoagulation therapy with Eliquis who presents to the hospital after a fall. Please refer to Dr. Ephriam Jenkins admission note on 09/16/2014. On arrival to the hospital, the patient was noted to have a right superior as well as inferior pubic rami fracture and was admitted.     INITIAL VITAL SIGNS:  Temperature was 99, pulse was 60, respirations were 20, blood pressure 177/78 that improved to 117/57, oxygen saturations were 99% on room air.   PHYSICAL EXAMINATION: Was unremarkable. The patient did have some mild tenderness in the right side of the pelvis and decreased range of motion of right lower extremity due to pain.     LABORATORY AND IMAGING:   The patient's laboratory data done on arrival to the hospital showed elevated glucose level at 165 on 09/16/2014 and creatinine level of 1.05, otherwise BMP was normal. The patient's liver enzymes were normal. The patient's white blood cell count was elevated at 11.3, hemoglobin was 12.9, platelet count was 261,000. Coagulation panel: Pro-time was 15.1 and INR was 1.2. Urinalysis revealed yellow clear urine, negative for glucose, bilirubin or ketones. Specific gravity 1.016, pH was 6.0. Negative for blood, protein, nitrites, or leukocyte esterase, from 0-5 red blood cells as well as white blood cells, no bacteria, 0-5 epithelial cells. EKG showed sinus bradycardia at 55 beats per minute, possible anterior infarct which was cited on or  before  06/11/2014 as well as nonspecific T wave abnormalities.   HOSPITAL COURSE:  The patient was admitted to the hospital for further evaluation. She was seen by physical therapist and recommended home health physical therapy and pain control.  On the day of discharge, 09/17/2014, she felt better, her pain was a little bit more  better controlled.   She is going to be discharged to home with home health.  Her vital signs:   Temperature was 98.4, pulse was 60, respiration were 18, blood pressure 118/58, saturation was 97% on room air at rest. The patient is to continue physical therapy, as well as pain medications as needed.   In regards to osteoporosis, she was advised to continue calcium supplements as well as vitamin D.   Otherwise, the patient will be discharged on calcium with vitamin D at 1 tablet twice daily dose.   The patient's vitamin D level should be checked as outpatient and make decisions about additional medications if needed. In regards to renal insufficiency, the patient's kidney function was rechecked later in the day and was found to be normal. The patient was complaining of some chest pains while she was in the hospital. Repeated EKG did not show any changes and her cardiac enzymes were negative. The pain was short lived in fleeting.  It was unclear if she had any real coronary artery disease pain since she was not able to elaborate much more about the pain and was complaining of pains fleeing into the back concerning for possible musculoskeletal pain after fall. In regards to atrial fibrillation, the patient remained in sinus rhythm. She is to continue amiodarone as well as apixaban for hyperlipidemia. She is to continue Crestor for gastroesophageal disease. She is to continue PPIs.  She is being discharged in stable condition with the above-mentioned medications and follow-up.   TIME SPENT: 40 minutes.     ____________________________ Theodoro Grist, MD rv:tr D: 09/17/2014 09:28:00 ET T: 09/17/2014 13:37:19 ET JOB#: [id number]  cc: Laurene Footman, MD Venia Carbon, MD Theodoro Grist, MD, <Dictator>   Xavia Kniskern Ether Griffins MD ELECTRONICALLY SIGNED 09/20/2014 19:24

## 2014-10-02 NOTE — Consult Note (Signed)
Brief Consult Note: Diagnosis: pubic rami fractures.   Patient was seen by consultant.   Orders entered.   Comments: PT, WBAT.  Electronic Signatures: Laurene Footman (MD)  (Signed 15-Apr-16 08:15)  Authored: Brief Consult Note   Last Updated: 15-Apr-16 08:15 by Laurene Footman (MD)

## 2014-10-02 NOTE — Consult Note (Signed)
PATIENT NAME:  Kylie May, DEVERS MR#:  [id number] DATE OF BIRTH:  12/17/1921  DATE OF CONSULTATION:  09/17/2014  REFERRING PHYSICIAN:   CONSULTING PHYSICIAN:  Laurene Footman, MD  REASON FOR CONSULT:  Pelvic fracture.   HISTORY OF PRESENT ILLNESS:  The patient was seen on 09/16/2014.  She was noted to have right-sided pubic rami fractures and was unable to ambulate.  She is alert and conscious. She suffered a fall while visiting family out of state.  She had severe pain, but sat in the car driving home.  When she got home, she was unable to really walk and take care of herself in her home and was brought in to the Emergency Room where she is admitted because of pubic rami fractures.  PHYSICAL EXAMINATION:  She is neurovascularly intact in both lower extremities.  She does not have pain with logrolling, but she has severe tenderness to palpation over the anterior groin.  Slight pain with lateral compression testing.  No abdominal pain to palpation.  REVIEW OF SYSTEMS:  Reviewed and she did not appear to have any other orthopedic problem.  Denied loss of consciousness at the time of her injury.   IMPRESSION:  Stable appearance to right-sided pubic rami fractures.   RECOMMENDATION:  Physical therapy, pain management, and followup if  a month if she is still having pain.  I do expect this pain to resolve in a month, but she will probably require rehabilitation secondary to this fracture.    ____________________________ Laurene Footman, MD [id number] D: 09/17/2014 15:56:04 ET T: 09/17/2014 16:34:24 ET JOB#: [id number]  cc: Laurene Footman, MD, <Dictator> Laurene Footman MD ELECTRONICALLY SIGNED 09/17/2014 20:18

## 2014-10-02 NOTE — H&P (Signed)
PATIENT NAME:  Kylie May, Kylie May MR#:  [id number] DATE OF BIRTH:  [birth year]  DATE OF ADMISSION:  09/16/2014  REFERRING DOCTOR: Ahmed Prima, MD  PRIMARY CARE PRACTITIONER: Venia Carbon, MD of [facility].  ADMITTING DOCTOR: Juluis Mire, MD   CHIEF COMPLAINT: Right hip pain following mechanical fall.   HISTORY OF PRESENT ILLNESS: A 81 year old Caucasian female with a past medical history of  atrial fibrillation on amiodarone and chronic anticoagulation, hyperlipidemia, gastroesophageal reflux disease, osteoporosis, and hard of hearing, presents to the Emergency Room with the complaint of right hip pain following a mechanical fall. The patient stated that while she was coming down the stairs, she misstepped and felt on her right hip, following which she developed right hip pain and not able to move; hence, came to the Emergency Room for further evaluation. Denies hitting her head. No history of loss of consciousness. No chest pain. No shortness of breath. No palpitations. No dizziness. No focal weakness or numbness. In the Emergency Room, the patient was evaluated by the ED physician and workup revealed with an x-ray of the pelvis suspicious for pubic ramus fracture. The patient underwent a CT of the pelvis which showed nondisplaced right superior inferior pubic ramus fracture; hence, hospitalist service was consulted for further evaluation and management. The patient received some IV pain medications, following which her pain is under control at this time. Denies any other complaints such as chest pain, shortness of breath, dizziness, loss of consciousness. No focal weakness or numbness. Denies any recent fever, cough. No nausea, vomiting, diarrhea. No urinary symptoms.   PAST MEDICAL HISTORY:  1.  Atrial fibrillation diagnosed in January 2016.  2.  Hyperlipidemia.  3.  Gastroesophageal reflux disease.  4.  Osteoporosis.  5.  Hard of hearing.  PAST SURGICAL HISTORY: Right  breast biopsy.   ALLERGIES: FLAGYL.   FAMILY HISTORY: Sister with a history of heart disease and atrial fibrillation.   SOCIAL HISTORY: She is single, lives at home alone. No history of any smoking, alcohol, or substance abuse.  HOME MEDICATIONS: 1.  Amiodarone 400 mg tablet 1 tablet orally 1 time a day.  2.  Eliquis 2.5 mg 1 tablet orally 2 times a day.  3.  Carvedilol 3.125 mg oral tablet 1 tablet 2 times a day.  4.  Crestor 20 mg 1 tablet orally 3 times a week; that is on Monday, Wednesday, and Friday.  5.  Magnesium oxide 400 mg 1 tablet orally once a day.  6.  Nexium 40 mg 1 tablet orally once a day.  7.  Ocular lubricant 1 drop to each affected eye every hour as needed for dry eyes.   REVIEW OF SYSTEMS:  CONSTITUTIONAL: Negative for fever, chills. No fatigue. No generalized weakness.  EYES: Negative for blurred vision or double vision. No pain. No redness. No discharge.  EARS, NOSE, AND THROAT: Negative for tinnitus, ear pain. History of hearing loss, uses hearing aid.  RESPIRATORY: Negative for cough, wheezing, dyspnea, hemoptysis, or painful respiration.  CARDIOVASCULAR: Negative for chest pain, palpitations, dizziness, syncopal episodes, orthopnea, dyspnea on exertion, pedal edema.  GASTROINTESTINAL: Negative for nausea, vomiting, diarrhea, abdominal pain, hematemesis, melena, rectal bleeding. History of chronic GERD symptoms, stable on PPI.  GENITOURINARY: Negative for dysuria, frequency, urgency, or hematuria.  ENDOCRINE: Negative for polyuria, nocturia, heat or cold intolerance.  HEMATOLOGIC AND LYMPHATIC: Negative for anemia, easy bruising or bleeding, or swollen glands.  INTEGUMENTARY: Negative for acne, skin rash, or lesions.  MUSCULOSKELETAL: Right hip  pain following mechanical fall as noted in the history of present illness.  NEUROLOGICAL: Negative for focal weakness or numbness. No history of CVA, TIA, or seizure disorder.  PSYCHIATRIC: Negative for anxiety, insomnia,  or depression.   PHYSICAL EXAMINATION:  VITAL SIGNS: Temperature 99 degrees Fahrenheit, pulse rate 60 per minute, respirations 20 per minute, blood pressure 177/78 on arrival, current blood pressure 117/57, oxygen saturation 99% on room air.  GENERAL: Well developed, well nourished, alert, in no acute distress, comfortably resting in the bed.  HEAD: Atraumatic, normocephalic.  EYES: Pupils equal, react to light and accommodation. No conjunctival pallor. No icterus. Extraocular movements intact.  NOSE: No drainage.  EARS: No drainage.  ORAL CAVITY: No mucositis. No icterus.  NECK: Supple. No JVD. No thyromegaly. No carotid bruit. Range of motion of neck within normal limits.  RESPIRATORY: Good respiratory effort. Not using accessory muscles of respiration. Bilateral vesicular breath sounds with no rales or rhonchi.  CARDIOVASCULAR: S1, S2 irregularly irregular. No murmurs, gallops, or clicks. Pulses equal at carotid, femoral, and pedal pulses. No peripheral edema.  GASTROINTESTINAL: Abdomen soft, nontender. No hepatosplenomegaly. No masses noticed. No guarding. Bowel sounds present and equal in all 4 quadrants.  GENITOURINARY: Deferred.  MUSCULOSKELETAL: Mild tenderness over the right side of the pelvis and decreased range of motion of the right lower extremity secondary to pain. Strength and tone equal bilaterally.  SKIN: Inspection within normal limits.  LYMPHATIC: No cervical lymphadenopathy.  VASCULAR: Good dorsalis pedis and posterior tibial pulses.  NEUROLOGICAL: Alert, awake, and oriented x 3. Cranial nerves II through XII grossly intact. No sensory deficit. Motor strength 5/5 in both of her lower extremities. DTRs 2+ bilateral and symmetrical. Plantars downgoing.  PSYCHIATRIC: Alert, awake, and oriented x 3. Judgment and insight adequate. Memory and mood within normal limits.   LABORATORY DATA: WBC 11.3, hemoglobin 12.9, hematocrit 39.7, platelet count 261,000.  IMAGING STUDIES: X-ray,  pelvis: Probable fracture involving the right superior and inferior pubic ramus of indeterminate age.  CT scan, noncontrast study of the pelvis:  1.  Nondisplaced right superior and inferior pubic ramus fractures.  2.  Remote right pubic body factor.   EKG: Sinus bradycardia with ventricular rate of 55 beats of bigeminy. No acute ST-T changes.   ASSESSMENT AND PLAN: A 81 year old Caucasian female with a history of chronic atrial fibrillation on Eliquis, gastroesophageal reflux disease, hyperlipidemia hard of hearing, presents with right hip pain following a mechanical fall, found to have nondisplaced right pubic ramus fracture.  1.  Nondisplaced fracture, right pubic ramus, following a mechanical fall secondary to tripping. Plan: Admit. Pain control measures. Orthopedic consultation. Physical therapy and occupational therapy consultation to help in ambulation.  2.  History of atrial fibrillation on amiodarone and Eliquis, stable clinically. EKG: Sinus bradycardia. No acute cardiovascular symptoms. Plan: Monitor. Continue home medications. 3.  Chronic anticoagulation. The patient on Eliquis. Continue same.  4.  Gastroesophageal reflux disease, stable on proton pump inhibitor. Continue same.  5.  Hyperlipidemia, on Crestor, stable. Continue Crestor Monday, Wednesday, Friday.  6.  Deep vein thrombosis prophylaxis. Patient on Eliquis. Continue same.  7.  Gastrointestinal prophylaxis. Proton pump inhibitor.  CODE STATUS: Full code.   TIME SPENT: 50 minutes.   ____________________________ Juluis Mire, MD enr:ST D: 09/16/2014 01:30:54 ET T: 09/16/2014 02:00:59 ET JOB#: [id number]  cc: Juluis Mire, MD, <Dictator> Venia Carbon, MD Juluis Mire MD ELECTRONICALLY SIGNED 09/16/2014 19:23

## 2014-10-02 NOTE — Consult Note (Signed)
PATIENT NAME:  Kylie May, Kylie May MR#:  [id number] DATE OF BIRTH:  [birth year]  DATE OF CONSULTATION:  06/11/2014  CONSULTING PHYSICIAN:  Denice Bors. Stanford Breed, MD  HISTORY OF PRESENT ILLNESS: The patient is a 81 year old female who I am asked to evaluate for atrial fibrillation. She has no prior cardiac history. Beginning after Christmas, she developed worsening fatigue as well as lower extremity edema. She denied dyspnea, chest pain, palpitations or syncope. She was seen at urgent care and noted to have an elevated heart rate and was brought to the [facility] for further evaluation. She was found to be in atrial fibrillation with a rapid ventricular response. She was treated initially with IV Cardizem, but was also placed on amiodarone and has converted to sinus rhythm. Cardiology was asked to evaluate. Note her symptoms are much improved at present.   PAST MEDICAL HISTORY: Significant for hyperlipidemia, but there is no diabetes mellitus, hypertension, or prior stroke. She has a history of a hiatal hernia, scoliosis and osteopenia as well as degenerative joint disease. She has had some skin cancers removed.   SOCIAL HISTORY: She does not smoke. She does consume 1 glass of wine per evening. She lives alone.   PAST SURGICAL HISTORY: Significant for prior skin cancer removals.   FAMILY HISTORY: Significant for sisters with atrial fibrillation. Her daughter has had a myocardial infarction with stents.   REVIEW OF SYSTEMS: She denies any headaches or fevers or chills. There was no productive cough or hemoptysis. There was no dysphagia, odynophagia, melena, hematochezia. There was no dysuria or hematuria. There is no rashes or seizure activity. There has been no orthopnea or PND. There has been pedal edema. She has had some fatigue. The remaining systems are negative.   PHYSICAL EXAMINATION:  VITAL SIGNS: Her physical exam today shows a pulse of 89. Her blood pressure is 108/70. Her temperature is  98.3.   GENERAL: She is well-developed and somewhat frail. She is no acute distress at present.   SKIN: Warm and dry. She does have some ecchymosis noted over her upper extremity from previous blood draws.   PSYCHIATRIC: She does not appear to be depressed.  EXTREMITIES: There no peripheral clubbing.   MUSCULOSKELETAL: Her back is normal.   HEENT: Is normal with normal eyelids.   NECK: Supple with a normal upstroke bilaterally. There are no bruits noted. There no jugular venous distention and no thyromegaly is noted.   LUNGS: Her chest is clear to auscultation with normal expansion.   CARDIOVASCULAR: Reveals a regular rate and rhythm. There is a 1/6 systolic murmur at the apex.   ABDOMEN: Nontender. Distended. Positive bowel sounds. No hepatosplenomegaly and no masses appreciated. There is no abdominal bruit. She has 2+ femoral pulses bilaterally. No bruits. She does have an umbilical hernia.   EXTREMITIES: Show no edema. There are mild varicosities noted. She has 1+ distal pulses bilaterally.   NEUROLOGIC: Grossly intact.   LABORATORY DATA: Her sodium is 137 with a potassium of 3.6. Her BUN is 9 with a creatinine of 1.15. Magnesium initially was 1.1 and has improved to 2.0. Cardiac enzymes are negative. TSH is elevated at 8.45. Free thyroxine is 1.10. Hemoglobin 13.1, hematocrit 40.7. Platelet count is 239,000.   DIAGNOSTIC DATA: Her electrocardiogram on admission atrial fibrillation with a rapid ventricular response, low voltage and nonspecific ST changes. Echocardiogram shows normal LV function, severe left atrial enlargement, mild right atrial enlargement, severe mitral regurgitation and moderate to severe tricuspid regurgitation per report. Chest x-ray on  admission showed small bilateral pleural effusions with atelectasis.   DIAGNOSES:  1. Paroxysmal atrial fibrillation. The patient presented with atrial fibrillation that was symptomatic including fatigue and mild lower extremity  edema. She has converted to sinus rhythm with amiodarone. We will continue full load at 400 mg p.o. b.i.d. for 2 weeks and then decrease to 200 mg daily. She will need follow-up thyroid-stimulating hormone, liver functions and chest x-ray in the future. She has embolic risk factors of age greater than 84, female sex and now congestive heart failure. Her CHADS-Vasc score is therefore 4. We will therefore treat with apixaban and based on her age and weight, the dose will be 2.5 mg p.o. b.i.d. She will need to have her renal function and hemoglobin checked in 4 weeks. Note: We discussed the risk of bleeding. She does not routinely fall and there is no history of bleeding. Her symptoms of fatigue and congestive heart failure have improved since sinus has been re-established.  2. Severe mitral regurgitation. This is noted on echocardiogram by report. This is most likely contributing to her atrial fibrillation. Her symptoms are much improved following re-establishing sinus rhythm. Given her age, doubt she would be a good candidate for mitral valve surgery. This will need follow-up in the future with serial echocardiograms.  3. Hyperlipidemia. Continue statin.   If the patient remains in sinus rhythm, tomorrow morning she can be discharged with plan as outlined above. She should follow up with Dr. Rockey Situ in 1 to 2 weeks following discharge.      ____________________________ Denice Bors. Stanford Breed, MD bsc:TT D: 06/11/2014 16:58:19 ET T: 06/11/2014 17:26:04 ET JOB#: [id number]  cc: Denice Bors. Stanford Breed, MD, <Dictator> Lelon Perla MD ELECTRONICALLY SIGNED 06/12/2014 13:13

## 2014-10-02 NOTE — H&P (Signed)
PATIENT NAME:  Kylie May, Kylie May MR#:  [id number] DATE OF BIRTH:  Aug 25, 1921  DATE OF ADMISSION:  06/09/2014  PRIMARY CARE PHYSICIAN: From [place]. The patient used to see Dr. Melynda Ripple, but now she started seeing Dr. Silvio Pate from [place].   CHIEF COMPLAINT: Tiredness.   HISTORY OF PRESENT ILLNESS: This is a 81 year old female . The patient went to urgent care today and was sent in here because of a heart rate in the 150s. She reports that for the past 2 to 3 weeks she has been feeling fatigued with bilateral leg swelling and feels like she is drained out. The patient was found to have atrial fibrillation with RVR. In the ER, the heart rate was in the 150s. She received 10 mg of Cardizem IV push, and now she is started on 10 mg of Cardizem per hour. Heart rate is around the 120s, and the blood pressure is around 116/94. The patient denies any chest pain. No exertional dyspnea. No cough. No fever. No recent illnesses. The patient also denies any dizziness.  The patient's lab work is within normal limits except for atrial fibrillation with RVR. The patient's troponins have been negative. The patient is going to be admitted to the intensive care unit for new onset atrial fibrillation with RVR.   PAST MEDICAL HISTORY: Significant for hiatal hernia, history of hyperlipidemia,  Scoliosis and also has a history of osteopenia and degenerative joint disease.   ALLERGIES: SHE IS ALLERGIC TO FLAGYL.   SOCIAL HISTORY: The patient is not a smoker. No alcohol. No drugs. She lives alone, but she has very close family. Her daughters always check on her. According to the daughter, she is very active and always keeps herself busy and ambulatory. She does not use any walker or cane.   PAST SURGICAL HISTORY: None.   FAMILY HISTORY:  Her sister had a heart attack and also has a history of atrial fibrillation.   MEDICATIONS AT HOME: Nexium 40 mg p.o. daily and Crestor 20 mg p.o. on Monday, Wednesday and Friday  only.  REVIEW OF SYSTEMS:  CONSTITUTIONAL: No fever. No fatigue.  EYES: No blurred vision. No double vision. No inflammation.  ENT: No tinnitus. No epistaxis. The patient does have hearing difficulties.  RESPIRATORY: Denies any cough or wheezing. No exertional dyspnea.  CARDIOVASCULAR: No chest pain. Does have pedal edema, having swelling in the legs since Christmas. The patient tries to elevate the legs without further relief. The patient denies any palpitations. No history of syncope.  GASTROINTESTINAL: No nausea. No vomiting. No abdominal pain. No constipation. Has some increased frequency of stools. She does not think she has diarrhea.  GENITOURINARY: No frequency of urination. No dysuria.   ENDOCRINE: No polyuria or nocturia.  HEMATOLOGIC: No anemia or easy bruising.  INTEGUMENTARY: No skin rashes.  MUSCULOSKELETAL: Denies any leg cramps or limited activity. Does have a history of degenerative joint disease in the back but does not take any medications for that.  NEUROLOGIC: Denies any history of strokes. No numbness or weakness. No dysarthria.  PSYCHIATRIC: No anxiety, insomnia or depression.   PHYSICAL EXAMINATION:  VITAL SIGNS: Temperature 97.5, heart rate 155 on arrival. Right now she is on Cardizem 10 mg/hour, and heart rate is in the 120s. BP is still 130/90, saturation 95% on room air.  GENERAL: The patient is a 81 year old female patient who is cognitively intact and physically active for her age. She comes in, and she denies any complaints. The patient is oriented to time,  place and person and able to give all of the complaints.  HEENT: Head: Normocephalic, atraumatic. Eyes: Pupils equal and reacting to light. No conjunctival pallor. No scleral icterus. Nose: No nasal lesions. No drainage. Ears: No drainage. No external lesions. Mouth: No lesions. No exudates.  NECK: Supple. No JVD. No carotid bruit. Normal range of motion. The patient's thyroid is in the midline.  RESPIRATORY:  Bilateral breath sounds present and clear to auscultation. No wheeze. No rales. The patient is not using accessory muscles of respiration.  CARDIOVASCULAR: S1, S2 irregularly irregular. The patient has no carotid bruit. She does have 1+ pedal edema. Unable to check femoral or pedal pulse secondary to atrial fibrillation.  GASTROINTESTINAL: Abdomen is soft, nontender, nondistended. The patient has no hernias. Bowel sounds present in all quadrants.  MUSCULOSKELETAL: Normal gait and station. Extremities move x4. No (cyanosis,no clubbing.. Strength and tone are equal bilaterally.  SKIN: Inspection is normal, well hydrated.  LYMPHATIC: No cervical lymph nodes. No lymphadenopathy.  NEUROLOGIC: Cranial nerves II through XII are intact. Power 5/5 in upper and lower extremities. Sensory intact. DTRs are 2+ bilaterally.  PSYCHIATRIC: Motor and affect are within normal limits. Oriented to time, place and person. Judgment and memory are intact.   LABORATORY DATA: The patient's WBC is 9.5, hemoglobin 13.7, hematocrit 40.7, platelets 239,000. Electrolytes: Sodium 137, potassium 3.6, chloride 104, bicarbonate 25, BUN 9, creatinine 1.1 glucose 101. Troponin less than 0.02. The patient's BNP was not done in the ER  CHEST X-RAY: Shows no pulmonary edema. Small bilateral pleural effusions with bilateral basilar atelectasis or infiltrate.   EKG: Shows atrial fibrillation at 155 beats per minute.   ASSESSMENT AND PLAN: This is a 81 year old female with hyperlipidemia and GERD who comes in with generalized weakness. She was found to have atrial fibrillation with rapid ventricular rate. We admitted her to the intensive care unit. She was started on a Cardizem drip. The patient was started on full dose Lovenox. Her CHADS2 score is 2. Further discussion should be made to start her on full anticoagulation due to her age and risk for intracranial hemorrhage.  1.  Hyperlipidemia. Continue Crestor.   2.  Gastroesophageal reflux  disease. Continue PPIs.  3.  The patient's family, especially the daughter, does not want [facility], and the patient was admitted to the ICU for atrial fibrillation with RVR. She was started on a Cardizem drip and Lovenox for anticoagulation. The patient will get an echocardiogram. Cycle the troponins. Check BNP and check TSH also for ruling out hyperthyroidism. The patient will get a UA to evaluate for any urinary infection. I have consulted Dr. Neoma Laming for  cardiology.   TIME SPENT: 60 minutes.   This is a critical care H and P.    ____________________________ Epifanio Lesches, MD sk:JT D: 06/09/2014 15:10:56 ET T: 06/09/2014 15:45:32 ET JOB#: [id number]  cc: Epifanio Lesches, MD, <Dictator> Epifanio Lesches MD ELECTRONICALLY SIGNED 07/06/2014 17:33

## 2014-10-02 NOTE — Discharge Summary (Signed)
PATIENT NAME:  Kylie May, Kylie May MR#:  [id number] DATE OF BIRTH:  [birth year]  DATE OF ADMISSION:  06/09/2014 DATE OF DISCHARGE:  06/12/2014  ADMITTING DIAGNOSIS: New onset of atrial fibrillation, rapid ventricular rate.   DISCHARGE DIAGNOSES:  1.  Atrial fibrillation, rapid ventricular rate, converted into sinus rhythm on amiodarone drip. 2.  Hypertension with atrial fibrillation, rapid ventricular rate. 3.  Hypomagnesemia likely due to Nexium, replenished. 4.  Mild diastolic congestive heart failure due to tachyarrhythmia. 5.  Severe mitral regurgitation, moderate to severe tricuspid regurgitation, severe dilatation of left atrium, mild dilatation of right atrium, elevated left atrial as well as left ventricular end-diastolic pressure seen on echo during this admission with normal ejection fraction of 55% and above. 6.  History of gastroesophageal reflux disease and hyperlipidemia.   DISCHARGE CONDITION: Stable.   DISCHARGE MEDICATIONS: 1.  The patient is to continue Crestor 10 mg p.o. 3 times daily weekly.  2.  Nexium 40 mg 2 daily.  3.  Amiodarone 400 mg p.o. twice daily for 7 days then 1 tablet daily until seen by Dr. Rockey Situ.  4.  Eliquis 2.5 mg p.o. twice daily.  5.  Carvedilol 3.125 mg p.o. twice daily, watching blood pressure closely.  6.  Magnesium oxide 400 mg p.o. daily.  7.  Ocular lubricant 1 drop to each affected eye every hour as needed for dry eyes.  HOME OXYGEN: None.   DIET: Low salt, low fat, low cholesterol, regular consistency.  ACTIVITY LIMITATIONS: As tolerated.  FOLLOWUP APPOINTMENTS: With Dr. Rockey Situ in 2 days after discharge or first available appointment, Dr. Silvio Pate in 2 days after discharge.  CONSULTANTS: Dr. Stanford Breed, Dr. Neoma Laming, care management, social work.   RADIOLOGIC STUDIES: Portable chest x-ray 06/09/2014 showed no pulmonary edema, small bilateral pleural effusions with bilateral bibasilar atelectasis or infiltrate. Repeated chest x-ray,  portable single view, 06/11/2014, showed persistent small bilateral pleural effusions with bibasilar air-space opacities likely reflecting atelectasis, stable from prior study; mild vascular congestion noted. Lung V/Q scan 06/11/2014 revealed absent pulmonary embolism, very low probability for pulmonary embolism. Echocardiogram 06/09/2014 showed left ventricular ejection fraction by visual estimation 55% to 60%, elevated left atrial as well as left ventricular end-diastolic pressures, restricted pattern of left ventricular diastolic filling, severely dilated left atrium as well as mildly dilated right atrium, severe mitral valve regurgitation, mild aortic valve sclerosis without stenosis, moderate to severe tricuspid regurgitation. No cardiac source of thrombi was identified.   HOSPITAL COURSE: The patient is a 81 year old Caucasian female with past medical history significant for history of hiatal hernia, hyperlipidemia, scoliosis, osteopenia, DJD who presents to the hospital with complaints of feeling tired. Please refer to Dr. Governor Specking admission note on 06/09/2014. On arrival to the hospital, the patient was noted to be in atrial fibrillation with a rate of 150. She was also noted to have bilateral lower extremity swelling and felt as if she was drained out. She received Cardizem IV and she was started on Cardizem IV drip. Unfortunately, her heart rate still remained very high and her blood pressure decreased. At that point she was changed to amiodarone IV drip with improvement of her heart rate and conversion to sinus rhythm. She was seen by Dr. Neoma Laming as well as Dr. Stanford Breed in consultation, who felt that patient should continue amiodarone orally. The patient was advised to continue amiodarone 400 mg twice daily dose for the next week and then 400 mg once daily dose until she is seen by Dr. Rockey Situ. Dr. Rockey Situ will  determine if in fact patient needs to have her dose decreased even further as the  patient progresses. The patient did have a TSH checked while she was in the hospital, which was elevated at 8.45. However, free thyroxine as well as free T3 levels were within normal limits. It was unclear why the patient in fact developed atrial fibrillation, but cardiac structural abnormalities was significant, concerning that the patient may likely be intermittently in atrial fibrillation due to her elevated dilated left atrium as well as right atrium due to mitral regurgitation. In regard to mild diastolic CHF, it was felt that the patient's mild diastolic CHF was due to tachyarrhythmia. The patient was given a few doses of Lasix in the hospital, but no Lasix was ordered for her upon discharge. It is recommended to follow the patient's fluid status as outpatient and make decisions about initiating her on diuretics if needed. No ACE inhibitors were initiated, as well, due to her relative hypotension; however, the patient was initiated on a low dose of carvedilol to control her heart rate even further. On the day of discharge, the patient's temperature was 98.3, pulse was ranging from 93 to 103, respiration rate was 19 to 20, blood pressure 122/77, saturation was 91% to 92% on room air at rest as well as on exertion. The patient was able to ambulate in the hospital and did not require any home health nurses for followup. In regard to her chronic medical problems such as gastroesophageal reflux disease, as well as hyperlipidemia, patient was advised to continue her outpatient medications. Of note, the patient was initiated on Eliquis upon discharge from the hospital. She is to follow up with her primary care physician as well as cardiologist, Dr. Rockey Situ, as outpatient and make decisions about further therapy for her atrial fibrillation.   TIME SPENT: 40 minutes.    ____________________________ Theodoro Grist, MD rv:ST D: 06/12/2014 31:59:45 ET T: 06/12/2014 21:40:20 ET JOB#: [id number]  cc: Theodoro Grist,  MD, <Dictator> Minna Merritts, MD Venia Carbon, MD Theodoro Grist MD ELECTRONICALLY SIGNED 06/23/2014 9:24

## 2014-10-02 NOTE — Consult Note (Signed)
PATIENT NAME:  Kylie May, Kylie May MR#:  [id number] DATE OF BIRTH:  04/19/1922  DATE OF CONSULTATION:  06/10/2014  REFERRING PHYSICIAN:   CONSULTING PHYSICIAN:  Dionisio David, MD  INDICATION FOR CONSULTATION: Atrial fibrillation.   HISTORY OF PRESENT ILLNESS: This is a 81 year old white female with a past medical history of hyperlipidemia and osteoporosis who presented to the Emergency Room with feeling of shortness of breath, leg swelling and difficulty breathing. According to the daughter, she was doing fine. She has trouble hearing, but lately she just could not do what she normally is able to do and was brought to urgent care where she was seen to be in A-fib with rapid rate and was transferred to the Emergency Room and admitted.   ALLERGIES: FLAGYL.   PAST MEDICAL HISTORY: Hiatal hernia, hyperlipidemia, scoliosis, osteopenia, degenerative joint disease.   SOCIAL HISTORY: She does not smoke or drink.   FAMILY HISTORY: Her sister had MI and atrial fibrillation.   MEDICATIONS: Nexium, Crestor.   PHYSICAL EXAMINATION: GENERAL: She is alert and oriented x3, in mild distress right now.  VITAL SIGNS: Her ventricular rate with A-fib on the monitor is 110, respirations 18, and blood pressure is 130/70. NECK: Positive JVD.  LUNGS: Good air entry. No rales or rhonchi.  HEART: Irregularly irregular. Pulse normal. S1, S2 no audible murmur.  ABDOMEN: Soft, nontender. Positive bowel sounds.  EXTREMITIES: 1+ pedal edema.   DIAGNOSTIC DATA: EKG shows A-fib with ventricular response rate 155.   Chest x-ray shows bilateral pleural effusion with bibasilar atelectasis or infiltrate.   She had labs done which showed BUN of 9, creatinine 1.15. Troponins x3 were negative. Her magnesium is 1.1. White count is 9.5, hemoglobin is 13.1 and hematocrit is 40.7.   ASSESSMENT AND PLAN: The patient has atrial fibrillation with rapid ventricular response rate. Her echocardiogram showed ejection fraction 60%,  severely dilated left atrium, mildly dilated right atrium, severe mitral regurgitation, moderate to severe tricuspid regurgitation. Based on age, being over 22, and questionable congestive heart failure, she would be CHADS-2. Advise anticoagulation with probably Eliquis. We will start the patient on amiodarone, discontinue Cardizem drip, and will follow the patient closely with you.   Thank you very much for the referral.   ____________________________ Dionisio David, MD sak:sb D: 06/10/2014 09:11:48 ET T: 06/10/2014 09:45:50 ET JOB#: [id number]  cc: Dionisio David, MD, <Dictator> Dionisio David MD ELECTRONICALLY SIGNED 06/14/2014 15:38

## 2014-10-03 DIAGNOSIS — W109XXD Fall (on) (from) unspecified stairs and steps, subsequent encounter: Secondary | ICD-10-CM | POA: Diagnosis not present

## 2014-10-03 DIAGNOSIS — Z7901 Long term (current) use of anticoagulants: Secondary | ICD-10-CM | POA: Diagnosis not present

## 2014-10-03 DIAGNOSIS — S3282XD Multiple fractures of pelvis without disruption of pelvic ring, subsequent encounter for fracture with routine healing: Secondary | ICD-10-CM | POA: Diagnosis not present

## 2014-10-03 DIAGNOSIS — I4891 Unspecified atrial fibrillation: Secondary | ICD-10-CM | POA: Diagnosis not present

## 2014-10-03 DIAGNOSIS — M810 Age-related osteoporosis without current pathological fracture: Secondary | ICD-10-CM | POA: Diagnosis not present

## 2014-10-03 DIAGNOSIS — H9193 Unspecified hearing loss, bilateral: Secondary | ICD-10-CM | POA: Diagnosis not present

## 2014-10-05 DIAGNOSIS — Z7901 Long term (current) use of anticoagulants: Secondary | ICD-10-CM | POA: Diagnosis not present

## 2014-10-05 DIAGNOSIS — I4891 Unspecified atrial fibrillation: Secondary | ICD-10-CM | POA: Diagnosis not present

## 2014-10-05 DIAGNOSIS — M810 Age-related osteoporosis without current pathological fracture: Secondary | ICD-10-CM | POA: Diagnosis not present

## 2014-10-05 DIAGNOSIS — S3282XD Multiple fractures of pelvis without disruption of pelvic ring, subsequent encounter for fracture with routine healing: Secondary | ICD-10-CM | POA: Diagnosis not present

## 2014-10-05 DIAGNOSIS — H9193 Unspecified hearing loss, bilateral: Secondary | ICD-10-CM | POA: Diagnosis not present

## 2014-10-05 DIAGNOSIS — W109XXD Fall (on) (from) unspecified stairs and steps, subsequent encounter: Secondary | ICD-10-CM | POA: Diagnosis not present

## 2014-10-11 DIAGNOSIS — Z7901 Long term (current) use of anticoagulants: Secondary | ICD-10-CM | POA: Diagnosis not present

## 2014-10-11 DIAGNOSIS — H9193 Unspecified hearing loss, bilateral: Secondary | ICD-10-CM | POA: Diagnosis not present

## 2014-10-11 DIAGNOSIS — I4891 Unspecified atrial fibrillation: Secondary | ICD-10-CM | POA: Diagnosis not present

## 2014-10-11 DIAGNOSIS — S3282XD Multiple fractures of pelvis without disruption of pelvic ring, subsequent encounter for fracture with routine healing: Secondary | ICD-10-CM | POA: Diagnosis not present

## 2014-10-11 DIAGNOSIS — W109XXD Fall (on) (from) unspecified stairs and steps, subsequent encounter: Secondary | ICD-10-CM | POA: Diagnosis not present

## 2014-10-11 DIAGNOSIS — M810 Age-related osteoporosis without current pathological fracture: Secondary | ICD-10-CM | POA: Diagnosis not present

## 2014-10-13 DIAGNOSIS — H9193 Unspecified hearing loss, bilateral: Secondary | ICD-10-CM | POA: Diagnosis not present

## 2014-10-13 DIAGNOSIS — M810 Age-related osteoporosis without current pathological fracture: Secondary | ICD-10-CM | POA: Diagnosis not present

## 2014-10-13 DIAGNOSIS — I4891 Unspecified atrial fibrillation: Secondary | ICD-10-CM | POA: Diagnosis not present

## 2014-10-13 DIAGNOSIS — W109XXD Fall (on) (from) unspecified stairs and steps, subsequent encounter: Secondary | ICD-10-CM | POA: Diagnosis not present

## 2014-10-13 DIAGNOSIS — Z7901 Long term (current) use of anticoagulants: Secondary | ICD-10-CM | POA: Diagnosis not present

## 2014-10-13 DIAGNOSIS — S3282XD Multiple fractures of pelvis without disruption of pelvic ring, subsequent encounter for fracture with routine healing: Secondary | ICD-10-CM | POA: Diagnosis not present

## 2014-10-18 DIAGNOSIS — M810 Age-related osteoporosis without current pathological fracture: Secondary | ICD-10-CM | POA: Diagnosis not present

## 2014-10-18 DIAGNOSIS — I4891 Unspecified atrial fibrillation: Secondary | ICD-10-CM | POA: Diagnosis not present

## 2014-10-18 DIAGNOSIS — H9193 Unspecified hearing loss, bilateral: Secondary | ICD-10-CM | POA: Diagnosis not present

## 2014-10-18 DIAGNOSIS — S3282XD Multiple fractures of pelvis without disruption of pelvic ring, subsequent encounter for fracture with routine healing: Secondary | ICD-10-CM | POA: Diagnosis not present

## 2014-10-18 DIAGNOSIS — Z7901 Long term (current) use of anticoagulants: Secondary | ICD-10-CM | POA: Diagnosis not present

## 2014-10-18 DIAGNOSIS — W109XXD Fall (on) (from) unspecified stairs and steps, subsequent encounter: Secondary | ICD-10-CM | POA: Diagnosis not present

## 2014-10-19 ENCOUNTER — Encounter: Payer: Self-pay | Admitting: Cardiovascular Disease

## 2014-10-19 ENCOUNTER — Ambulatory Visit (INDEPENDENT_AMBULATORY_CARE_PROVIDER_SITE_OTHER): Payer: Medicare Other | Admitting: Cardiovascular Disease

## 2014-10-19 VITALS — BP 110/60 | HR 66 | Ht 60.0 in | Wt 108.0 lb

## 2014-10-19 DIAGNOSIS — R946 Abnormal results of thyroid function studies: Secondary | ICD-10-CM

## 2014-10-19 DIAGNOSIS — E7800 Pure hypercholesterolemia, unspecified: Secondary | ICD-10-CM

## 2014-10-19 DIAGNOSIS — I4891 Unspecified atrial fibrillation: Secondary | ICD-10-CM

## 2014-10-19 DIAGNOSIS — I5032 Chronic diastolic (congestive) heart failure: Secondary | ICD-10-CM | POA: Diagnosis not present

## 2014-10-19 DIAGNOSIS — R7989 Other specified abnormal findings of blood chemistry: Secondary | ICD-10-CM

## 2014-10-19 DIAGNOSIS — S329XXS Fracture of unspecified parts of lumbosacral spine and pelvis, sequela: Secondary | ICD-10-CM

## 2014-10-19 NOTE — Assessment & Plan Note (Signed)
Recent fall with pelvic fracture, walking with a walker. She does continue to have positional pain

## 2014-10-19 NOTE — Progress Notes (Signed)
Patient ID: Kylie May, female    DOB: April 21, 1922, 81 y.o.   MRN: [id number]  HPI Comments: 81 year old female with history of paroxysmal asymptomatic  a-fib,  HTN, hiatal hernia, osteopenia, degenerative joint disease, and scoliosis who was admitted to [facility] 1/7-1/10/16 for new onset a-fib and mild diastolic CHF, who presents today for follow-up of her atrial fibrillation. Since starting amiodarone, she has had a climb in her TSH . Last TSH of 19 Amiodarone was held and she was started on flecainide on her last clinic visit Several weeks ago she had a fall last week, missed a step and fell onto her right side, suffered pelvic fracture in 2 areas per the patient.  In follow-up today she reports that she feels well with no signs Denies any tachycardia concerning for atrial fibrillation No chest tightness, no shortness of breath  EKG on today's visit shows sinus rhythm with rate 66 bpm, poor R-wave progression to the anterior precordial leads, no significant ST or T-wave changes  Other past medical history Echocardiogram 06/09/2014 shows normal LV function, severely dilated left atrium, severe MR, moderate to severe TR  Beginning after Christmas 2015 she developed worsening fatigue as well as lower extremity edema.  She was seen at urgent care and noted to have an elevated heart rate and was brought to [facility] for further evaluation.  She was found to be in a-fib with RVR. She was treated initially with IV Cardizem, however her BP became soft and was changed to amiodarone gtt. She did convert to NSR and was continued on po amiodarone at 400 mg bid at discharge.  She was placed on apixaban 2.5 mg bid (age 45 and weight 54.3 kg).    Allergies  Allergen Reactions  . Metronidazole     Other reaction(s): Dizziness and giddiness (finding), Other (qualifier value) made things feel like they were vibrating like the bed and floor,also caused mild dizziness weakness    Outpatient Encounter  Prescriptions as of 10/19/2014  Medication Sig  . apixaban (ELIQUIS) 2.5 MG TABS tablet Take 1 tablet (2.5 mg total) by mouth 2 (two) times daily.  . carvedilol (COREG) 3.125 MG tablet Take 1 tablet (3.125 mg total) by mouth 2 (two) times daily with a meal.  . esomeprazole (NEXIUM) 40 MG capsule Take 1 capsule (40 mg total) by mouth daily.  . flecainide (TAMBOCOR) 50 MG tablet Take 1 tablet (50 mg total) by mouth 2 (two) times daily.  . magnesium oxide (MAG-OX) 400 MG tablet Take 1 tablet (400 mg total) by mouth daily.  . rosuvastatin (CRESTOR) 20 MG tablet Take 1 tablet (20 mg total) by mouth daily.   No facility-administered encounter medications on file as of 10/19/2014.    Past Medical History  Diagnosis Date  . Diverticulosis   . Schatzki's ring   . Esophageal stricture 2006  . Hiatal hernia   . Compression fracture of lumbar vertebra   . Femoral bruit     Right  . Fibrocystic breast disease   . Mitral valve prolapse   . Hypercholesterolemia     F[place] study LDL goal = < 160  . Acid reflux disease   . Pancreatitis 07-2008    Hospitalized   . Diverticulitis 01-2008    [facility] [place]  . Skin cancer     facial x 2. [name]  . Osteoporosis     Dr Matthew Saras  . Arrhythmia   . A-fib   . Chronic diastolic CHF (congestive heart failure)   .  Mitral regurgitation     a. 06/2014 EF 55-60%, elevated end-diastolic pressures, dilated LA at 4.3 cm, mildly dilated RA, severe mitral regurgitation, mild aortic sclerosis without stenosis, mod-severe TR    Past Surgical History  Procedure Laterality Date  . G3 p4 (twins)    . Esophageal dilation  2006  . Epidural steroids      x1  . Skin cancer excision    . Colonoscopy  2006    Social History  reports that she quit smoking about 63 years ago. She has never used smokeless tobacco. She reports that she drinks about 4.2 oz of alcohol per week. She reports that she does not use illicit drugs.  Family History family history includes  Breast cancer in her maternal aunt; Heart attack (age of onset: 24) in her daughter; Heart attack (age of onset: 76) in her sister; Hepatitis in her daughter; Lung disease in her mother. There is no history of Diabetes or Stroke.  Review of Systems  Constitutional: Negative.   Respiratory: Negative.   Cardiovascular: Negative.   Gastrointestinal: Negative.   Musculoskeletal: Negative.   Skin: Negative.   Neurological: Negative.   Hematological: Negative.   Psychiatric/Behavioral: Negative.   All other systems reviewed and are negative.   BP 110/60 mmHg  Pulse 66  Ht 5' (1.524 m)  Wt 108 lb (48.988 kg)  BMI 21.09 kg/m2  Physical Exam  Constitutional: She is oriented to person, place, and time. She appears well-developed and well-nourished.  HENT:  Head: Normocephalic.  Nose: Nose normal.  Mouth/Throat: Oropharynx is clear and moist.  Eyes: Conjunctivae are normal. Pupils are equal, round, and reactive to light.  Neck: Normal range of motion. Neck supple. No JVD present.  Cardiovascular: Normal rate, regular rhythm, S1 normal, S2 normal, normal heart sounds and intact distal pulses.  Exam reveals no gallop and no friction rub.   No murmur heard. Pulmonary/Chest: Effort normal and breath sounds normal. No respiratory distress. She has no wheezes. She has no rales. She exhibits no tenderness.  Abdominal: Soft. Bowel sounds are normal. She exhibits no distension. There is no tenderness.  Musculoskeletal: Normal range of motion. She exhibits no edema or tenderness.  Lymphadenopathy:    She has no cervical adenopathy.  Neurological: She is alert and oriented to person, place, and time. Coordination normal.  Skin: Skin is warm and dry. No rash noted. No erythema.  Psychiatric: She has a normal mood and affect. Her behavior is normal. Judgment and thought content normal.    Assessment and Plan  Nursing note and vitals reviewed.

## 2014-10-19 NOTE — Assessment & Plan Note (Signed)
She appears relatively euvolemic on today's visit. No edema, lungs clear

## 2014-10-19 NOTE — Patient Instructions (Signed)
You are doing well. No medication changes were made.  We will check a TSH today  Please call us if you have new issues that need to be addressed before your next appt.  Your physician wants you to follow-up in: 6 months.  You will receive a reminder letter in the mail two months in advance. If you don't receive a letter, please call our office to schedule the follow-up appointment.

## 2014-10-19 NOTE — Assessment & Plan Note (Addendum)
Amiodarone held on her last clinic visit. We will recheck TSH today in clinic. She is asymptomatic

## 2014-10-19 NOTE — Assessment & Plan Note (Signed)
Doing well on the flecainide, no breakthrough arrhythmia. Continue current medications

## 2014-10-19 NOTE — Assessment & Plan Note (Signed)
Encouraged her to stay on her Crestor

## 2014-10-20 DIAGNOSIS — S3282XD Multiple fractures of pelvis without disruption of pelvic ring, subsequent encounter for fracture with routine healing: Secondary | ICD-10-CM | POA: Diagnosis not present

## 2014-10-20 DIAGNOSIS — W109XXD Fall (on) (from) unspecified stairs and steps, subsequent encounter: Secondary | ICD-10-CM | POA: Diagnosis not present

## 2014-10-20 DIAGNOSIS — M810 Age-related osteoporosis without current pathological fracture: Secondary | ICD-10-CM | POA: Diagnosis not present

## 2014-10-20 DIAGNOSIS — H9193 Unspecified hearing loss, bilateral: Secondary | ICD-10-CM | POA: Diagnosis not present

## 2014-10-20 DIAGNOSIS — Z7901 Long term (current) use of anticoagulants: Secondary | ICD-10-CM | POA: Diagnosis not present

## 2014-10-20 DIAGNOSIS — I4891 Unspecified atrial fibrillation: Secondary | ICD-10-CM | POA: Diagnosis not present

## 2014-10-20 LAB — TSH: TSH: 16.89 u[IU]/mL — ABNORMAL HIGH (ref 0.450–4.500)

## 2014-10-24 DIAGNOSIS — H9193 Unspecified hearing loss, bilateral: Secondary | ICD-10-CM | POA: Diagnosis not present

## 2014-10-24 DIAGNOSIS — W109XXD Fall (on) (from) unspecified stairs and steps, subsequent encounter: Secondary | ICD-10-CM | POA: Diagnosis not present

## 2014-10-24 DIAGNOSIS — Z7901 Long term (current) use of anticoagulants: Secondary | ICD-10-CM | POA: Diagnosis not present

## 2014-10-24 DIAGNOSIS — S3282XD Multiple fractures of pelvis without disruption of pelvic ring, subsequent encounter for fracture with routine healing: Secondary | ICD-10-CM | POA: Diagnosis not present

## 2014-10-24 DIAGNOSIS — I4891 Unspecified atrial fibrillation: Secondary | ICD-10-CM | POA: Diagnosis not present

## 2014-10-24 DIAGNOSIS — M810 Age-related osteoporosis without current pathological fracture: Secondary | ICD-10-CM | POA: Diagnosis not present

## 2014-10-25 DIAGNOSIS — S32509A Unspecified fracture of unspecified pubis, initial encounter for closed fracture: Secondary | ICD-10-CM | POA: Diagnosis not present

## 2014-10-26 DIAGNOSIS — Z7901 Long term (current) use of anticoagulants: Secondary | ICD-10-CM | POA: Diagnosis not present

## 2014-10-26 DIAGNOSIS — W109XXD Fall (on) (from) unspecified stairs and steps, subsequent encounter: Secondary | ICD-10-CM | POA: Diagnosis not present

## 2014-10-26 DIAGNOSIS — M810 Age-related osteoporosis without current pathological fracture: Secondary | ICD-10-CM | POA: Diagnosis not present

## 2014-10-26 DIAGNOSIS — I4891 Unspecified atrial fibrillation: Secondary | ICD-10-CM | POA: Diagnosis not present

## 2014-10-26 DIAGNOSIS — S3282XD Multiple fractures of pelvis without disruption of pelvic ring, subsequent encounter for fracture with routine healing: Secondary | ICD-10-CM | POA: Diagnosis not present

## 2014-10-26 DIAGNOSIS — H9193 Unspecified hearing loss, bilateral: Secondary | ICD-10-CM | POA: Diagnosis not present

## 2014-11-03 DIAGNOSIS — R531 Weakness: Secondary | ICD-10-CM | POA: Diagnosis not present

## 2014-11-03 DIAGNOSIS — S3282XD Multiple fractures of pelvis without disruption of pelvic ring, subsequent encounter for fracture with routine healing: Secondary | ICD-10-CM | POA: Diagnosis not present

## 2014-11-03 DIAGNOSIS — R262 Difficulty in walking, not elsewhere classified: Secondary | ICD-10-CM | POA: Diagnosis not present

## 2014-11-03 DIAGNOSIS — W109XXD Fall (on) (from) unspecified stairs and steps, subsequent encounter: Secondary | ICD-10-CM | POA: Diagnosis not present

## 2014-11-08 DIAGNOSIS — R262 Difficulty in walking, not elsewhere classified: Secondary | ICD-10-CM | POA: Diagnosis not present

## 2014-11-08 DIAGNOSIS — W109XXD Fall (on) (from) unspecified stairs and steps, subsequent encounter: Secondary | ICD-10-CM | POA: Diagnosis not present

## 2014-11-08 DIAGNOSIS — S3282XD Multiple fractures of pelvis without disruption of pelvic ring, subsequent encounter for fracture with routine healing: Secondary | ICD-10-CM | POA: Diagnosis not present

## 2014-11-08 DIAGNOSIS — R531 Weakness: Secondary | ICD-10-CM | POA: Diagnosis not present

## 2014-11-10 DIAGNOSIS — R531 Weakness: Secondary | ICD-10-CM | POA: Diagnosis not present

## 2014-11-10 DIAGNOSIS — W109XXD Fall (on) (from) unspecified stairs and steps, subsequent encounter: Secondary | ICD-10-CM | POA: Diagnosis not present

## 2014-11-10 DIAGNOSIS — S3282XD Multiple fractures of pelvis without disruption of pelvic ring, subsequent encounter for fracture with routine healing: Secondary | ICD-10-CM | POA: Diagnosis not present

## 2014-11-10 DIAGNOSIS — R262 Difficulty in walking, not elsewhere classified: Secondary | ICD-10-CM | POA: Diagnosis not present

## 2014-11-14 ENCOUNTER — Telehealth: Payer: Self-pay | Admitting: *Deleted

## 2014-11-14 NOTE — Telephone Encounter (Signed)
Received cardiac clearance request for pt to proceed w/ entropian repair on 11/17/14. Per Christell Faith, PA, pt may "hold Eliquis 2 days prior to procedure, resume 24 hrs post procedure or when ok per MD". Faxed to [phone].

## 2014-11-14 NOTE — Telephone Encounter (Signed)
Haven't received cardiac clearance request. Will keep checking the fax machine.

## 2014-11-14 NOTE — Telephone Encounter (Signed)
[name] Dr Alvera Singh office calling to check up on a clearance they sent, She is resending it today. She stated it needed to be done today. Please call when done, they are waiting urgently.

## 2014-11-17 DIAGNOSIS — H02402 Unspecified ptosis of left eyelid: Secondary | ICD-10-CM | POA: Diagnosis not present

## 2014-11-23 DIAGNOSIS — S32501S Unspecified fracture of right pubis, sequela: Secondary | ICD-10-CM | POA: Diagnosis not present

## 2014-11-23 DIAGNOSIS — S32501D Unspecified fracture of right pubis, subsequent encounter for fracture with routine healing: Secondary | ICD-10-CM | POA: Diagnosis not present

## 2015-02-27 DIAGNOSIS — H02042 Spastic entropion of right lower eyelid: Secondary | ICD-10-CM | POA: Diagnosis not present

## 2015-03-31 DIAGNOSIS — Z23 Encounter for immunization: Secondary | ICD-10-CM | POA: Diagnosis not present

## 2015-04-13 ENCOUNTER — Telehealth: Payer: Self-pay

## 2015-04-13 NOTE — Telephone Encounter (Signed)
Should be fine to hold the anticoagulation for 4 days prior to procedure

## 2015-04-13 NOTE — Telephone Encounter (Signed)
Spoke w/ pt's daughter, Hassan Rowan.   Pt is sched to see Dr. Rockey Situ on 04/18/15. Pt is sched for eye surgery on 04/20/15 w/ Dr. Vickki Muff @ [facility].  Pt s/p entropian repair 11/17/14, reports that the procedure did not have the expected outcome, they are going to release the tendons, as pt's eye lids are turned in; lashes are scratching her cornea. At that time, Thurmond Butts recommended she hold Eliquis for 2 days prior to procedure. We have not received cardiac clearance for this procedure, we did for 6/16. She reports that pt was instructed by Dr. Alvera Singh office to hold Eliquis for 4 days and she would like to make sure Dr. Rockey Situ is agreeable to this.  She reports that there will be minimal bleeding, as the procedure utilizes cauterization. Please advise. Thank you.

## 2015-04-13 NOTE — Telephone Encounter (Signed)
Pt daughter is calling states pt is having a procedure on her tendons in her eyes. Pt is asking is it ok to stop Eliquis 4 days before. Surgery is on 11/17. Alternate [phone]

## 2015-04-14 NOTE — Telephone Encounter (Signed)
Spoke w/ pt's daughter.  Advised her of Dr. Gollan's recommendation.  She verbalizes understanding and will call back w/ any questions or concerns.  

## 2015-04-18 ENCOUNTER — Encounter: Payer: Self-pay | Admitting: Cardiovascular Disease

## 2015-04-18 ENCOUNTER — Ambulatory Visit (INDEPENDENT_AMBULATORY_CARE_PROVIDER_SITE_OTHER): Payer: Medicare Other | Admitting: Cardiovascular Disease

## 2015-04-18 VITALS — BP 124/74 | HR 92 | Ht 60.5 in | Wt 113.0 lb

## 2015-04-18 DIAGNOSIS — I480 Paroxysmal atrial fibrillation: Secondary | ICD-10-CM | POA: Diagnosis not present

## 2015-04-18 DIAGNOSIS — E7800 Pure hypercholesterolemia, unspecified: Secondary | ICD-10-CM

## 2015-04-18 DIAGNOSIS — I340 Nonrheumatic mitral (valve) insufficiency: Secondary | ICD-10-CM

## 2015-04-18 DIAGNOSIS — I5032 Chronic diastolic (congestive) heart failure: Secondary | ICD-10-CM

## 2015-04-18 DIAGNOSIS — S329XXS Fracture of unspecified parts of lumbosacral spine and pelvis, sequela: Secondary | ICD-10-CM

## 2015-04-18 MED ORDER — ROSUVASTATIN CALCIUM 20 MG PO TABS: 20.0000 mg | ORAL_TABLET | Freq: Every day | ORAL | Status: DC

## 2015-04-18 NOTE — Assessment & Plan Note (Signed)
Doing well on the flecainide, no breakthrough arrhythmia. Continue current medications 

## 2015-04-18 NOTE — Assessment & Plan Note (Signed)
Appears relatively euvolemic on today's visit. No changes to her medications 

## 2015-04-18 NOTE — Patient Instructions (Signed)
You are doing well. No medication changes were made.  Ok to restart the eliquis 3 days after the eye surgery, On Sunday  Please call us if you have new issues that need to be addressed before your next appt.  Your physician wants you to follow-up in: 12 months.  You will receive a reminder letter in the mail two months in advance. If you don't receive a letter, please call our office to schedule the follow-up appointment.

## 2015-04-18 NOTE — Assessment & Plan Note (Signed)
She has recovered from her pelvic fracture. Continues to have periodic falls Gait instability

## 2015-04-18 NOTE — Assessment & Plan Note (Signed)
Currently taking Crestor 3 times per week, unclear why. Denies any significant side effects We will continue this for now and discuss with her in a follow-up visit

## 2015-04-18 NOTE — Assessment & Plan Note (Signed)
Prior history of significant MR. We'll recommend repeat ultrasound on her next clinic visit Currently appears euvolemic with no complaints of shortness of breath or CHF

## 2015-04-18 NOTE — Progress Notes (Signed)
Patient ID: Kylie May, female    DOB: [birth year], 81 y.o.   MRN: [id number]  HPI Comments: 81 year old female with history of paroxysmal asymptomatic  a-fib,  HTN, hiatal hernia, osteopenia, degenerative joint disease, and scoliosis who was admitted to [facility] 1/7-1/10/16 for new onset a-fib and mild diastolic CHF, who presents today for follow-up of her atrial fibrillation. Since starting amiodarone, she  had a climb in her TSH , peak of 19, with repeat down to 16  Amiodarone was held and she was started on flecainide  Previous  fall last week, missed a step and fell onto her right side, suffered pelvic fracture in 2 areas per the patient.  In follow up today, she reports that she has had several falls Last weekend fell onto her left side, some bruising, soreness but no significant trauma. Does not do any regular exercise otherwise has no complaints Denies any tachycardia concerning for arrhythmia Scheduled for eye surgery later this week, held her anticoagulation No chest tightness, no shortness of breath  EKG on today's visit shows normal sinus rhythm with rate 92 bpm, nonspecific ST and T wave abnormality  Other past medical history Echocardiogram 06/09/2014 shows normal LV function, severely dilated left atrium, severe MR, moderate to severe TR  Beginning after Christmas 2015 she developed worsening fatigue as well as lower extremity edema.  She was seen at urgent care and noted to have an elevated heart rate and was brought to [facility] for further evaluation.  She was found to be in a-fib with RVR. She was treated initially with IV Cardizem, however her BP became soft and was changed to amiodarone gtt. She did convert to NSR and was continued on po amiodarone at 400 mg bid at discharge.  She was placed on apixaban 2.5 mg bid (age 16 and weight 54.3 kg).    Allergies  Allergen Reactions  . Metronidazole     Other reaction(s): Dizziness and giddiness (finding), Other (qualifier  value) made things feel like they were vibrating like the bed and floor,also caused mild dizziness weakness    Outpatient Encounter Prescriptions as of 04/18/2015  Medication Sig  . carvedilol (COREG) 3.125 MG tablet Take 1 tablet (3.125 mg total) by mouth 2 (two) times daily with a meal.  . esomeprazole (NEXIUM) 40 MG capsule Take 1 capsule (40 mg total) by mouth daily.  . flecainide (TAMBOCOR) 50 MG tablet Take 1 tablet (50 mg total) by mouth 2 (two) times daily.  . magnesium oxide (MAG-OX) 400 MG tablet Take 1 tablet (400 mg total) by mouth daily.  . rosuvastatin (CRESTOR) 20 MG tablet Take 1 tablet (20 mg total) by mouth daily.  [name] apixaban (ELIQUIS) 2.5 MG TABS tablet Take 1 tablet (2.5 mg total) by mouth 2 (two) times daily. (Patient not taking: Reported on 04/18/2015)   No facility-administered encounter medications on file as of 04/18/2015.    Past Medical History  Diagnosis Date  . Diverticulosis   . Schatzki's ring   . Esophageal stricture 2006  . Hiatal hernia   . Compression fracture of lumbar vertebra ([place])   . Femoral bruit     Right  . Fibrocystic breast disease   . Mitral valve prolapse   . Hypercholesterolemia     [place] study LDL goal = < 160  . Acid reflux disease   . Pancreatitis 07-2008    Hospitalized   . Diverticulitis 01-2008    [facility]  . Skin cancer  facial x 2. [name]  . Osteoporosis     Dr Matthew Saras  . Arrhythmia   . A-fib ([place])   . Chronic diastolic CHF (congestive heart failure) ([place])   . Mitral regurgitation     a. 06/2014 EF 55-60%, elevated end-diastolic pressures, dilated LA at 4.3 cm, mildly dilated RA, severe mitral regurgitation, mild aortic sclerosis without stenosis, mod-severe TR    Past Surgical History  Procedure Laterality Date  . G3 p4 (twins)    . Esophageal dilation  2006  . Epidural steroids      x1  . Skin cancer excision    . Colonoscopy  2006    Social History  reports that she quit smoking about 63  years ago. She has never used smokeless tobacco. She reports that she drinks about 4.2 oz of alcohol per week. She reports that she does not use illicit drugs.  Family History family history includes Breast cancer in her maternal aunt; Heart attack (age of onset: 5) in her daughter; Heart attack (age of onset: 38) in her sister; Hepatitis in her daughter; Lung disease in her mother. There is no history of Diabetes or Stroke.  Review of Systems  Constitutional: Negative.   Respiratory: Negative.   Cardiovascular: Negative.   Gastrointestinal: Negative.   Musculoskeletal: Negative.   Skin: Negative.   Neurological: Negative.   Hematological: Negative.   Psychiatric/Behavioral: Negative.   All other systems reviewed and are negative.   BP 124/74 mmHg  Pulse 92  Ht 5' 0.5" (1.537 m)  Wt 113 lb (51.256 kg)  BMI 21.70 kg/m2  Physical Exam  Constitutional: She is oriented to person, place, and time. She appears well-developed and well-nourished.  HENT:  Head: Normocephalic.  Nose: Nose normal.  Mouth/Throat: Oropharynx is clear and moist.  Eyes: Conjunctivae are normal. Pupils are equal, round, and reactive to light.  Neck: Normal range of motion. Neck supple. No JVD present.  Cardiovascular: Normal rate, regular rhythm, S1 normal, S2 normal and intact distal pulses.  Exam reveals no gallop and no friction rub.   Murmur heard.  Systolic murmur is present with a grade of 2/6  Pulmonary/Chest: Effort normal and breath sounds normal. No respiratory distress. She has no wheezes. She has no rales. She exhibits no tenderness.  Abdominal: Soft. Bowel sounds are normal. She exhibits no distension. There is no tenderness.  Musculoskeletal: Normal range of motion. She exhibits no edema or tenderness.  Lymphadenopathy:    She has no cervical adenopathy.  Neurological: She is alert and oriented to person, place, and time. Coordination normal.  Skin: Skin is warm and dry. No rash noted. No  erythema.  Psychiatric: She has a normal mood and affect. Her behavior is normal. Judgment and thought content normal.    Assessment and Plan  Nursing note and vitals reviewed.

## 2015-04-20 DIAGNOSIS — H02042 Spastic entropion of right lower eyelid: Secondary | ICD-10-CM | POA: Diagnosis not present

## 2015-04-20 DIAGNOSIS — H02031 Senile entropion of right upper eyelid: Secondary | ICD-10-CM | POA: Diagnosis not present

## 2015-05-07 ENCOUNTER — Other Ambulatory Visit: Payer: Self-pay | Admitting: Internal Medicine

## 2015-06-08 ENCOUNTER — Telehealth: Payer: Self-pay | Admitting: Nurse Practitioner

## 2015-06-08 NOTE — Telephone Encounter (Signed)
Pt daughter called about wanting to know if the medication esomeprazole (NEXIUM) 40 MG capsule can be refilled by [facility]? Pharmacy is [facility], [place] fax number verbal [phone] or paper fax [phone]. Thank you!

## 2015-06-08 NOTE — Telephone Encounter (Signed)
Last 2/16 with you ok to refill nexium?

## 2015-06-09 MED ORDER — ESOMEPRAZOLE MAGNESIUM 40 MG PO CPDR: 40.0000 mg | DELAYED_RELEASE_CAPSULE | Freq: Every day | ORAL | Status: DC

## 2015-06-09 NOTE — Telephone Encounter (Signed)
Okay to fill for # [phone] no refills.

## 2015-06-09 NOTE — Telephone Encounter (Signed)
Sent as requested.

## 2015-06-12 ENCOUNTER — Other Ambulatory Visit: Payer: Self-pay | Admitting: Cardiovascular Disease

## 2015-07-06 DIAGNOSIS — L821 Other seborrheic keratosis: Secondary | ICD-10-CM | POA: Diagnosis not present

## 2015-07-06 DIAGNOSIS — Z85828 Personal history of other malignant neoplasm of skin: Secondary | ICD-10-CM | POA: Diagnosis not present

## 2015-07-06 DIAGNOSIS — L570 Actinic keratosis: Secondary | ICD-10-CM | POA: Diagnosis not present

## 2015-07-06 DIAGNOSIS — X32XXXA Exposure to sunlight, initial encounter: Secondary | ICD-10-CM | POA: Diagnosis not present

## 2015-07-06 DIAGNOSIS — D485 Neoplasm of uncertain behavior of skin: Secondary | ICD-10-CM | POA: Diagnosis not present

## 2015-07-07 DIAGNOSIS — C44629 Squamous cell carcinoma of skin of left upper limb, including shoulder: Secondary | ICD-10-CM | POA: Diagnosis not present

## 2015-07-24 ENCOUNTER — Encounter: Payer: Self-pay | Admitting: Family Medicine

## 2015-07-24 ENCOUNTER — Ambulatory Visit (INDEPENDENT_AMBULATORY_CARE_PROVIDER_SITE_OTHER): Payer: Medicare Other | Admitting: Family Medicine

## 2015-07-24 VITALS — BP 130/74 | HR 82 | Temp 98.9°F | Ht 60.05 in | Wt 113.2 lb

## 2015-07-24 DIAGNOSIS — J029 Acute pharyngitis, unspecified: Secondary | ICD-10-CM | POA: Diagnosis not present

## 2015-07-24 NOTE — Patient Instructions (Signed)
Your exam is benign.  This is likely viral.  Use over the counter chloraseptic spray and claritin or zyrtec.  Follow up as needed.  Take care  Dr. Lacinda Axon

## 2015-07-24 NOTE — Progress Notes (Signed)
   Subjective:  Patient ID: Kylie May, female    DOB: 10/03/1921  Age: 82 y.o. MRN: [id number]  CC: Sneezing, Sore throat  HPI:  82 year old female presents with the above complaints.  Has been sneezing intermittently for the past week. Developed sore throat yesterday. Moderate in severity. No exacerbating or relieving factors. No interventions tried. No associated fevers or chills. No other complaints today.  Social Hx   Social History   Social History  . Marital Status: Widowed    Spouse Name: N/A  . Number of Children: 3  . Years of Education: N/A   Occupational History  . 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 Smoking status: Former Smoker    Quit date: 06/04/1951  . Smokeless tobacco: Never Used     Comment: Smoked 1945-1953 , up to 2 cigarettes/day  . Alcohol Use: 4.2 oz/week    7 Glasses of wine per week  . Drug Use: No  . Sexual Activity: Not Asked   Other Topics Concern  . None   Social History Narrative   Has living will    Would want daughter Santiago Glad to be health care POA   Not sure about DNR--definitely doesn't want prolonged life support   Not sure about tube feeds either (but probably not)         Review of Systems  Constitutional: Negative.   HENT: Positive for sneezing and sore throat.    Objective:  BP 130/74 mmHg  Pulse 82  Temp(Src) 98.9 F (37.2 C) (Oral)  Ht 5' 0.05" (1.525 m)  Wt 113 lb 4 oz (51.37 kg)  BMI 22.09 kg/m2  SpO2 96%  BP/Weight 07/24/2015 04/18/2015 [id number]  Systolic BP [id number]  Diastolic BP 74 74 60  Wt. (Lbs) 113.25 113 108  BMI 22.09 21.7 21.09   Physical Exam  Constitutional: She appears well-developed. No distress.  HENT:  Head: Normocephalic and atraumatic.  Oropharynx with mild erythema. No exudate.  Cardiovascular: Normal rate and regular rhythm.   Pulmonary/Chest: Effort normal and breath sounds normal. No respiratory distress. She  has no wheezes. She has no rales.  Neurological: She is alert.  Vitals reviewed.  Lab Results  Component Value Date   WBC 11.3* 09/16/2014   HGB 11.6* 09/17/2014   HCT 39.6 09/16/2014   PLT 261 09/16/2014   GLUCOSE 107* 09/16/2014   CHOL 197 02/17/2014   TRIG 94.0 02/17/2014   HDL 69.90 02/17/2014   LDLDIRECT 121.4 06/17/2011   LDLCALC 108* 02/17/2014   ALT 15 09/16/2014   AST 20 09/16/2014   NA 135 09/16/2014   K 3.8 09/16/2014   CL 104 09/16/2014   CREATININE 0.91 09/16/2014   BUN 19 09/16/2014   CO2 26 09/16/2014   TSH 16.890* 10/19/2014   INR 1.2 09/16/2014   Assessment & Plan:   Problem List Items Addressed This Visit    Acute pharyngitis - Primary    New problem. Likely viral in origin. Advised antihistamine (claritin/zyrtec & chloraseptic spray).        Follow-up: PRN  [place]

## 2015-07-24 NOTE — Assessment & Plan Note (Signed)
New problem. Likely viral in origin. Advised antihistamine (claritin/zyrtec & chloraseptic spray).

## 2015-07-24 NOTE — Progress Notes (Signed)
Pre visit review using our clinic review tool, if applicable. No additional management support is needed unless otherwise documented below in the visit note. 

## 2015-07-28 ENCOUNTER — Ambulatory Visit (INDEPENDENT_AMBULATORY_CARE_PROVIDER_SITE_OTHER): Payer: Medicare Other | Admitting: Internal Medicine

## 2015-07-28 ENCOUNTER — Telehealth: Payer: Self-pay | Admitting: *Deleted

## 2015-07-28 ENCOUNTER — Encounter: Payer: Self-pay | Admitting: Internal Medicine

## 2015-07-28 VITALS — BP 102/60 | HR 73 | Temp 98.4°F | Resp 12 | Wt 113.2 lb

## 2015-07-28 DIAGNOSIS — R630 Anorexia: Secondary | ICD-10-CM | POA: Diagnosis not present

## 2015-07-28 DIAGNOSIS — J0100 Acute maxillary sinusitis, unspecified: Secondary | ICD-10-CM | POA: Diagnosis not present

## 2015-07-28 DIAGNOSIS — L270 Generalized skin eruption due to drugs and medicaments taken internally: Secondary | ICD-10-CM

## 2015-07-28 DIAGNOSIS — R6889 Other general symptoms and signs: Secondary | ICD-10-CM

## 2015-07-28 DIAGNOSIS — T50995A Adverse effect of other drugs, medicaments and biological substances, initial encounter: Secondary | ICD-10-CM

## 2015-07-28 DIAGNOSIS — R21 Rash and other nonspecific skin eruption: Secondary | ICD-10-CM

## 2015-07-28 LAB — POCT INFLUENZA A/B
INFLUENZA B, POC: NEGATIVE
Influenza A, POC: NEGATIVE

## 2015-07-28 MED ORDER — PREDNISONE 10 MG PO TABS: ORAL_TABLET | ORAL | Status: DC

## 2015-07-28 MED ORDER — AMOXICILLIN-POT CLAVULANATE 875-125 MG PO TABS: 1.0000 | ORAL_TABLET | Freq: Two times a day (BID) | ORAL | Status: DC

## 2015-07-28 MED ORDER — CULTURELLE DIGESTIVE HEALTH PO CAPS: 1.0000 | ORAL_CAPSULE | Freq: Every day | ORAL | Status: DC

## 2015-07-28 MED ORDER — BENZONATATE 100 MG PO CAPS: 100.0000 mg | ORAL_CAPSULE | Freq: Three times a day (TID) | ORAL | Status: DC | PRN

## 2015-07-28 NOTE — Telephone Encounter (Signed)
Patient's daughter has concerns about her having a red rash on her chest. She was seen in the office 07/24/15, she has hearing problems and was not able to get a full understanding of what was going on with her. He daughter  Is concerned that she may have strep throat because of the rash. Please advise  Butch Penny [phone]

## 2015-07-28 NOTE — Telephone Encounter (Signed)
Pt daughter called about her mom appt. Daughter would like to know what happened at her appt.  Daughter states she has a acute issue with bronchitis Call daughter @ [phone]. Thank you!

## 2015-07-28 NOTE — Telephone Encounter (Signed)
SPoke with the patient's daughter, she apologized for her mom just showing up.  She knows that it is difficult for her to communicate with staff due to her hearing.  She wanted to make sure that patient relayed the information correctly to her after her visit today.  Reviewed visit notes with her.

## 2015-07-28 NOTE — Telephone Encounter (Signed)
Patient walked in to the office, visualized rash and spoke with the Doc of the Day.  She is willing to see her for a acute regarding the rash, but she will have to wait.  Put patient in room for provider to see. thanks

## 2015-07-28 NOTE — Progress Notes (Signed)
Subjective:  Patient ID: Kylie May, female    DOB: [birth year]  Age: 82 y.o. MRN: [id number]  CC: The primary encounter diagnosis was Flu-like symptoms. Diagnoses of Acute maxillary sinusitis, recurrence not specified, Rash and nonspecific skin eruption, Anorexia, and Allergic drug rash were also pertinent to this visit.  HPI Kylie May presents for new onset abdominal rash.and persistent malaise with anorexia.   Patient was recently started on zyrtec and chloraseptic by Dr Lacinda Axon  On Feb 20 for acute pharyngitis which was accompanied by sneezing, sinus congestion, body aches .  Symptoms were attributed to allergic rhinitis. [name] However she reports persistent generalized weakness, malaise, and anorexia,  Because her food doesn't taste right.  Sinus drainage, sputum has been pink.  Developed rash on abdomen and back yesterday.  Has become intensely itchy. Also reporting persistent  headache over eyes   Patient is extremely hard of hearing .   Outpatient Prescriptions Prior to Visit  Medication Sig Dispense Refill  . apixaban (ELIQUIS) 2.5 MG TABS tablet Take 1 tablet (2.5 mg total) by mouth 2 (two) times daily. 60 tablet 0  . carvedilol (COREG) 3.125 MG tablet Take 1 tablet (3.125 mg total) by mouth 2 (two) times daily with a meal. 60 tablet 0  . carvedilol (COREG) 3.125 MG tablet TAKE 1 TABLET TWICE A DAY WITH MEALS 180 tablet 3  . ELIQUIS 2.5 MG TABS tablet TAKE 1 TABLET TWICE A DAY 180 tablet 3  . esomeprazole (NEXIUM) 40 MG capsule Take 1 capsule (40 mg total) by mouth daily. 90 capsule 0  . flecainide (TAMBOCOR) 50 MG tablet Take 1 tablet (50 mg total) by mouth 2 (two) times daily. 30 tablet 6  . magnesium oxide (MAG-OX) 400 MG tablet Take 1 tablet (400 mg total) by mouth daily. 30 tablet 0  . rosuvastatin (CRESTOR) 20 MG tablet Take 1 tablet (20 mg total) by mouth daily. 90 tablet 3   No facility-administered medications prior to visit.    Review of Systems;  Patient denies  headache, fevers, malaise, unintentional weight loss, skin rash, eye pain, sinus congestion and sinus pain, sore throat, dysphagia,  hemoptysis , cough, dyspnea, wheezing, chest pain, palpitations, orthopnea, edema, abdominal pain, nausea, melena, diarrhea, constipation, flank pain, dysuria, hematuria, urinary  Frequency, nocturia, numbness, tingling, seizures,  Focal weakness, Loss of consciousness,  Tremor, insomnia, depression, anxiety, and suicidal ideation.      Objective:  BP 102/60 mmHg  Pulse 73  Temp(Src) 98.4 F (36.9 C) (Oral)  Resp 12  Wt 113 lb 4 oz (51.37 kg)  SpO2 93%  BP Readings from Last 3 Encounters:  07/28/15 102/60  07/24/15 130/74  04/18/15 124/74    Wt Readings from Last 3 Encounters:  07/28/15 113 lb 4 oz (51.37 kg)  07/24/15 113 lb 4 oz (51.37 kg)  04/18/15 113 lb (51.256 kg)   HEENT: Head: Normocephalic, without obvious abnormality, atraumatic, sinuses tender to percussion Eyes: conjunctivae/corneas clear. PERRL, EOM's intact. Fundi benign. Ears: normal TM's and external ear canals both ears Nose: Nares normal. Septum midline. Mucosa injected and red . Maxillary  sinus tenderness bilateral, no crusting or bleeding points Throat: lips, mucosa, and tongue normal; teeth and gums normal throat red  Back: symmetric, no curvature. ROM normal. No CVA tenderness. Lungs: clear to auscultation bilaterally Heart: regular rate and rhythm, S1, S2 normal, no murmur, click, rub or gallop Abdomen: soft, non-tender; bowel sounds normal; no masses,  no organomegaly Pulses: 2+ and symmetric Skin: diffused erythematous  blanching rash covering abdomen and  back  Lymph nodes: Cervical, supraclavicular, and axillary nodes normal.  No results found for: HGBA1C  Lab Results  Component Value Date   CREATININE 0.91 09/16/2014   CREATININE 1.05* 09/16/2014   CREATININE 1.05* 06/14/2014    Lab Results  Component Value Date   WBC 11.3* 09/16/2014   HGB 11.6* 09/17/2014    HCT 39.6 09/16/2014   PLT 261 09/16/2014   GLUCOSE 107* 09/16/2014   CHOL 197 02/17/2014   TRIG 94.0 02/17/2014   HDL 69.90 02/17/2014   LDLDIRECT 121.4 06/17/2011   LDLCALC 108* 02/17/2014   ALT 15 09/16/2014   AST 20 09/16/2014   NA 135 09/16/2014   K 3.8 09/16/2014   CL 104 09/16/2014   CREATININE 0.91 09/16/2014   BUN 19 09/16/2014   CO2 26 09/16/2014   TSH 16.890* 10/19/2014   INR 1.2 09/16/2014    No results found.  Assessment & Plan:   Problem List Items Addressed This Visit    Acute sinusitis    Given chronicity of symptoms, development of facial pain and exam consistent with bacterial URI,  Will treat with empiric antibiotics, prednisone taper , and saline lavage.  probiotics advised for 3 week       Relevant Medications   amoxicillin-clavulanate (AUGMENTIN) 875-125 MG tablet   predniSONE (DELTASONE) 10 MG tablet   benzonatate (TESSALON PERLES) 100 MG capsule   Allergic drug rash    Suspected due to use of chloraseptic vs cetirizine. prednisone taper prescribed.        Other Visit Diagnoses    Flu-like symptoms    -  Primary    Relevant Orders    POCT Influenza A/B (Completed)    Rash and nonspecific skin eruption        Relevant Orders    CBC with Differential/Platelet    Anorexia        Relevant Orders    Basic metabolic panel      A total of 25 minutes of face to face time was spent with patient more than half of which was spent in counselling about the above mentioned conditions  and coordination of care  I am having Ms. Berch start on amoxicillin-clavulanate, predniSONE, benzonatate, and [place]. I am also having her maintain her magnesium oxide, carvedilol, apixaban, flecainide, rosuvastatin, esomeprazole, ELIQUIS, and carvedilol.  Meds ordered this encounter  Medications  . amoxicillin-clavulanate (AUGMENTIN) 875-125 MG tablet    Sig: Take 1 tablet by mouth 2 (two) times daily.    Dispense:  14 tablet    Refill:  0  .  predniSONE (DELTASONE) 10 MG tablet    Sig: 6 tablets all at once on Day 1 , then reduce by 1 tablet daily until gone    Dispense:  21 tablet    Refill:  0  . benzonatate (TESSALON PERLES) 100 MG capsule    Sig: Take 1 capsule (100 mg total) by mouth 3 (three) times daily as needed for cough.    Dispense:  20 capsule    Refill:  0  . Lactobacillus-Inulin (Eagar) CAPS    Sig: Take 1 capsule by mouth daily.    Dispense:  21 capsule    Refill:  0    PHARMACY MAKE MAKE SUBSTITUTION    There are no discontinued medications.  Follow-up: Return in about 2 weeks (around 08/11/2015) for Lorane Gell.   Crecencio Mc, MD

## 2015-07-28 NOTE — Patient Instructions (Addendum)
Your flu test was negative, Please bring [place] back proof of your flu vaccine so we may document it.  I am treating you for bacterial sinusitis which is a complication from your viral infection due to  persistent sinus congestion.   I am prescribing an antibiotic (augmentin and a prednisone taper  To manage the infection and the inflammation in your sinuses.   I also advise use of the following OTC meds to help with your other symptoms.   Take generic OTC benadryl 25 mg every 8 hours for the drainage  flush your sinuses twice daily with NeilMed sinus rinse  (do over the sink because if you do it right you will spit out globs of mucus)  Use benzonatate capsules   FOR THE DAYTIME COUGH.   Please take a probiotic ( Align, Floraque or Culturelle) OR A GENERIC EQUIVALENT for three weeks since you are taking an  antibiotic to prevent a very serious antibiotic associated infection  Called clostridium dificile colitis that can cause diarrhea, multi organ failure, sepsis and death if not managed.

## 2015-07-30 NOTE — Assessment & Plan Note (Signed)
Given chronicity of symptoms, development of facial pain and exam consistent with bacterial URI,  Will treat with empiric antibiotics, prednisone taper , and saline lavage.  probiotics advised for 3 week

## 2015-07-30 NOTE — Assessment & Plan Note (Addendum)
Suspected due to use of chloraseptic vs cetirizine. prednisone taper prescribed.

## 2015-07-31 ENCOUNTER — Other Ambulatory Visit (INDEPENDENT_AMBULATORY_CARE_PROVIDER_SITE_OTHER): Payer: Medicare Other

## 2015-07-31 DIAGNOSIS — R21 Rash and other nonspecific skin eruption: Secondary | ICD-10-CM

## 2015-07-31 LAB — CBC WITH DIFFERENTIAL/PLATELET
BASOS ABS: 0.1 10*3/uL (ref 0.0–0.1)
Basophils Relative: 0.7 % (ref 0.0–3.0)
EOS ABS: 0.1 10*3/uL (ref 0.0–0.7)
Eosinophils Relative: 0.9 % (ref 0.0–5.0)
HCT: 37.3 % (ref 36.0–46.0)
HEMOGLOBIN: 12.4 g/dL (ref 12.0–15.0)
LYMPHS ABS: 2.1 10*3/uL (ref 0.7–4.0)
Lymphocytes Relative: 18 % (ref 12.0–46.0)
MCHC: 33.3 g/dL (ref 30.0–36.0)
MCV: 87.7 fl (ref 78.0–100.0)
MONO ABS: 1.8 10*3/uL — AB (ref 0.1–1.0)
Monocytes Relative: 15.1 % — ABNORMAL HIGH (ref 3.0–12.0)
NEUTROS PCT: 65.3 % (ref 43.0–77.0)
Neutro Abs: 7.8 10*3/uL — ABNORMAL HIGH (ref 1.4–7.7)
Platelets: 321 10*3/uL (ref 150.0–400.0)
RBC: 4.26 Mil/uL (ref 3.87–5.11)
RDW: 13.3 % (ref 11.5–15.5)
WBC: 11.9 10*3/uL — AB (ref 4.0–10.5)

## 2015-07-31 LAB — BASIC METABOLIC PANEL
BUN: 24 mg/dL — ABNORMAL HIGH (ref 6–23)
CHLORIDE: 103 meq/L (ref 96–112)
CO2: 26 meq/L (ref 19–32)
Calcium: 9 mg/dL (ref 8.4–10.5)
Creatinine, Ser: 1.04 mg/dL (ref 0.40–1.20)
GFR: 52.71 mL/min — AB (ref >60.00)
GLUCOSE: 93 mg/dL (ref 70–99)
POTASSIUM: 4.1 meq/L (ref 3.5–5.1)
SODIUM: 136 meq/L (ref 135–145)

## 2015-08-01 ENCOUNTER — Encounter: Payer: Self-pay | Admitting: *Deleted

## 2015-08-05 DIAGNOSIS — R21 Rash and other nonspecific skin eruption: Secondary | ICD-10-CM | POA: Diagnosis not present

## 2015-08-07 ENCOUNTER — Ambulatory Visit (INDEPENDENT_AMBULATORY_CARE_PROVIDER_SITE_OTHER): Payer: Medicare Other | Admitting: Nurse Practitioner

## 2015-08-07 ENCOUNTER — Encounter: Payer: Self-pay | Admitting: Nurse Practitioner

## 2015-08-07 VITALS — BP 132/82 | HR 64 | Temp 98.2°F | Resp 16 | Ht 60.5 in | Wt 117.0 lb

## 2015-08-07 DIAGNOSIS — L270 Generalized skin eruption due to drugs and medicaments taken internally: Secondary | ICD-10-CM

## 2015-08-07 DIAGNOSIS — T50995A Adverse effect of other drugs, medicaments and biological substances, initial encounter: Secondary | ICD-10-CM

## 2015-08-07 NOTE — Progress Notes (Signed)
Patient ID: Kylie May, female    DOB: [birth year]  Age: 82 y.o. MRN: [id number]  CC: Rash   HPI LONIA SWEEZEY presents for follow up of rash.   1) Red and peeling  Breasts, abdomen, and back  Walk-in Clinic and gave her a taper of prednisone Gave her Augmentin, Prednisone, and Tessalon perles  Chloraseptic possible allergy rash   History Chalese has a past medical history of Diverticulosis; Schatzki's ring; Esophageal stricture (2006); Hiatal hernia; Compression fracture of lumbar vertebra ([place]); Femoral bruit; Fibrocystic breast disease; Mitral valve prolapse; Hypercholesterolemia; Acid reflux disease; Pancreatitis (07-2008); Diverticulitis (01-2008); Skin cancer; Osteoporosis; Arrhythmia; A-fib ([place]); Chronic diastolic CHF (congestive heart failure) ([place]); and Mitral regurgitation.   She has past surgical history that includes G3 P4 (twins); Esophageal dilation (2006); Epidural steroids; Skin cancer excision; and Colonoscopy (2006).   Her family history includes Breast cancer in her maternal aunt; Heart attack (age of onset: 78) in her daughter; Heart attack (age of onset: 23) in her sister; Hepatitis in her daughter; Lung disease in her mother. There is no history of Diabetes or Stroke.She reports that she quit smoking about 64 years ago. She has never used smokeless tobacco. She reports that she drinks about 4.2 oz of alcohol per week. She reports that she does not use illicit drugs.  Outpatient Prescriptions Prior to Visit  Medication Sig Dispense Refill  . apixaban (ELIQUIS) 2.5 MG TABS tablet Take 1 tablet (2.5 mg total) by mouth 2 (two) times daily. 60 tablet 0  . carvedilol (COREG) 3.125 MG tablet Take 1 tablet (3.125 mg total) by mouth 2 (two) times daily with a meal. 60 tablet 0  . esomeprazole (NEXIUM) 40 MG capsule Take 1 capsule (40 mg total) by mouth daily. 90 capsule 0  . flecainide (TAMBOCOR) 50 MG tablet Take 1 tablet (50 mg total) by mouth 2 (two) times daily. 30  tablet 6  . Lactobacillus-Inulin ([place]) CAPS Take 1 capsule by mouth daily. (Patient not taking: Reported on 08/14/2015) 21 capsule 0  . magnesium oxide (MAG-OX) 400 MG tablet Take 1 tablet (400 mg total) by mouth daily. 30 tablet 0  . predniSONE (DELTASONE) 10 MG tablet 6 tablets all at once on Day 1 , then reduce by 1 tablet daily until gone (Patient not taking: Reported on 08/14/2015) 21 tablet 0  . rosuvastatin (CRESTOR) 20 MG tablet Take 1 tablet (20 mg total) by mouth daily. 90 tablet 3  . benzonatate (TESSALON PERLES) 100 MG capsule Take 1 capsule (100 mg total) by mouth 3 (three) times daily as needed for cough. (Patient not taking: Reported on 08/07/2015) 20 capsule 0  . amoxicillin-clavulanate (AUGMENTIN) 875-125 MG tablet Take 1 tablet by mouth 2 (two) times daily. (Patient not taking: Reported on 08/07/2015) 14 tablet 0  . carvedilol (COREG) 3.125 MG tablet TAKE 1 TABLET TWICE A DAY WITH MEALS (Patient not taking: Reported on 08/07/2015) 180 tablet 3  . ELIQUIS 2.5 MG TABS tablet TAKE 1 TABLET TWICE A DAY (Patient not taking: Reported on 08/07/2015) 180 tablet 3   No facility-administered medications prior to visit.    ROS Review of Systems  Constitutional: Negative for fever, chills, diaphoresis and fatigue.  HENT: Positive for congestion, postnasal drip and rhinorrhea.   Respiratory: Positive for cough. Negative for chest tightness, shortness of breath and wheezing.   Cardiovascular: Negative for chest pain, palpitations and leg swelling.  Gastrointestinal: Negative for nausea, vomiting and diarrhea.  Skin: Positive for rash.  Flaky dry skin everywhere  Neurological: Negative for dizziness, weakness, numbness and headaches.  Psychiatric/Behavioral: The patient is not nervous/anxious.     Objective:  BP 132/82 mmHg  Pulse 64  Temp(Src) 98.2 F (36.8 C) (Oral)  Resp 16  Ht 5' 0.5" (1.537 m)  Wt 117 lb (53.071 kg)  BMI 22.47 kg/m2  SpO2 99%  Physical  Exam  Constitutional: She is oriented to person, place, and time. She appears well-developed and well-nourished. No distress.  HENT:  Head: Normocephalic and atraumatic.  Right Ear: External ear normal.  Left Ear: External ear normal.  Mouth/Throat: Oropharynx is clear and moist. No oropharyngeal exudate.  Very HOH Oral and nasal mucosa not affected  Cardiovascular: Normal rate, regular rhythm and normal heart sounds.  Exam reveals no gallop and no friction rub.   No murmur heard. Pulmonary/Chest: Effort normal and breath sounds normal. No respiratory distress. She has no wheezes. She has no rales. She exhibits no tenderness.  Neurological: She is alert and oriented to person, place, and time. Coordination normal.  Skin: Skin is warm and dry. No rash noted. She is not diaphoretic.  Generalized peeling of skin in the trunk, arms, and face area, normal to slightly pinkish skin tone under peeling skin  Psychiatric: She has a normal mood and affect. Her behavior is normal. Judgment and thought content normal.   Assessment & Plan:   Zemirah was seen today for rash.  Diagnoses and all orders for this visit:  Allergic drug rash  I am having Ms. Holihan maintain her magnesium oxide, carvedilol, apixaban, flecainide, rosuvastatin, esomeprazole, predniSONE, benzonatate, and [place].  No orders of the defined types were placed in this encounter.     Follow-up: Return in about 1 week (around 08/14/2015) for Follow up.

## 2015-08-07 NOTE — Patient Instructions (Addendum)
Stop the Prednisone  Eucerin over the counter twice daily  Aveeno Face Moisturizer daily- use warm wet wash cloth to remove flaky skin before using.   Follow up with me next week

## 2015-08-11 DIAGNOSIS — R05 Cough: Secondary | ICD-10-CM | POA: Diagnosis not present

## 2015-08-14 ENCOUNTER — Ambulatory Visit (INDEPENDENT_AMBULATORY_CARE_PROVIDER_SITE_OTHER): Payer: Medicare Other | Admitting: Nurse Practitioner

## 2015-08-14 ENCOUNTER — Encounter: Payer: Self-pay | Admitting: Nurse Practitioner

## 2015-08-14 VITALS — BP 114/76 | HR 74 | Temp 98.1°F | Ht 61.0 in | Wt 113.5 lb

## 2015-08-14 DIAGNOSIS — T50995A Adverse effect of other drugs, medicaments and biological substances, initial encounter: Secondary | ICD-10-CM | POA: Diagnosis not present

## 2015-08-14 DIAGNOSIS — B9789 Other viral agents as the cause of diseases classified elsewhere: Principal | ICD-10-CM

## 2015-08-14 DIAGNOSIS — L270 Generalized skin eruption due to drugs and medicaments taken internally: Secondary | ICD-10-CM | POA: Diagnosis not present

## 2015-08-14 DIAGNOSIS — J069 Acute upper respiratory infection, unspecified: Secondary | ICD-10-CM

## 2015-08-14 NOTE — Progress Notes (Signed)
Pre visit review using our clinic review tool, if applicable. No additional management support is needed unless otherwise documented below in the visit note. 

## 2015-08-14 NOTE — Progress Notes (Signed)
Patient ID: Kylie May, female    DOB: Jul 05, 1921  Age: 82 y.o. MRN: [id number]  CC: Follow-up   HPI PROVIDENCE HAGGERTY presents for follow up of cough. Patient is accompanied by daughter Hassan Rowan today.  1) Was seen in the walk-in on Friday and was given Delsym she reports. Patient told walking clinic provider that she was taking the amoxicillin when the rash happened, but today she reports that the amoxicillin was after the rash had already begun. Patient reports improvement in her skin quality. There is still dry skin seen on her today. The daughter is concerned about her coughing.  Patient reports it is productive, but daughter reports it is improving  A chest x-ray was obtained on 08/11/2014 by the walk-in clinic at [facility], Dr. Ammie Ferrier note shows that it is negative for pneumonia or bronchitis  History Dmiyah has a past medical history of Diverticulosis; Schatzki's ring; Esophageal stricture (2006); Hiatal hernia; Compression fracture of lumbar vertebra ([place]); Femoral bruit; Fibrocystic breast disease; Mitral valve prolapse; Hypercholesterolemia; Acid reflux disease; Pancreatitis (07-2008); Diverticulitis (01-2008); Skin cancer; Osteoporosis; Arrhythmia; A-fib ([place]); Chronic diastolic CHF (congestive heart failure) ([place]); and Mitral regurgitation.   She has past surgical history that includes G3 P4 (twins); Esophageal dilation (2006); Epidural steroids; Skin cancer excision; and Colonoscopy (2006).   Her family history includes Breast cancer in her maternal aunt; Heart attack (age of onset: 62) in her daughter; Heart attack (age of onset: 26) in her sister; Hepatitis in her daughter; Lung disease in her mother. There is no history of Diabetes or Stroke.She reports that she quit smoking about 64 years ago. She has never used smokeless tobacco. She reports that she drinks about 4.2 oz of alcohol per week. She reports that she does not use illicit drugs.  Outpatient Prescriptions Prior to  Visit  Medication Sig Dispense Refill  . apixaban (ELIQUIS) 2.5 MG TABS tablet Take 1 tablet (2.5 mg total) by mouth 2 (two) times daily. 60 tablet 0  . carvedilol (COREG) 3.125 MG tablet Take 1 tablet (3.125 mg total) by mouth 2 (two) times daily with a meal. 60 tablet 0  . esomeprazole (NEXIUM) 40 MG capsule Take 1 capsule (40 mg total) by mouth daily. 90 capsule 0  . flecainide (TAMBOCOR) 50 MG tablet Take 1 tablet (50 mg total) by mouth 2 (two) times daily. 30 tablet 6  . magnesium oxide (MAG-OX) 400 MG tablet Take 1 tablet (400 mg total) by mouth daily. 30 tablet 0  . rosuvastatin (CRESTOR) 20 MG tablet Take 1 tablet (20 mg total) by mouth daily. 90 tablet 3  . Lactobacillus-Inulin ([place]) CAPS Take 1 capsule by mouth daily. (Patient not taking: Reported on 08/14/2015) 21 capsule 0  . amoxicillin-clavulanate (AUGMENTIN) 875-125 MG tablet Take 1 tablet by mouth 2 (two) times daily. (Patient not taking: Reported on 08/07/2015) 14 tablet 0  . benzonatate (TESSALON PERLES) 100 MG capsule Take 1 capsule (100 mg total) by mouth 3 (three) times daily as needed for cough. (Patient not taking: Reported on 08/07/2015) 20 capsule 0  . carvedilol (COREG) 3.125 MG tablet TAKE 1 TABLET TWICE A DAY WITH MEALS (Patient not taking: Reported on 08/07/2015) 180 tablet 3  . ELIQUIS 2.5 MG TABS tablet TAKE 1 TABLET TWICE A DAY (Patient not taking: Reported on 08/07/2015) 180 tablet 3  . predniSONE (DELTASONE) 10 MG tablet 6 tablets all at once on Day 1 , then reduce by 1 tablet daily until gone (Patient not taking: Reported on  08/14/2015) 21 tablet 0   No facility-administered medications prior to visit.    ROS Review of Systems  Constitutional: Negative for fever, chills, diaphoresis and fatigue.  HENT: Positive for postnasal drip and rhinorrhea. Negative for congestion, ear pain, mouth sores, nosebleeds, sinus pressure, sneezing, sore throat, trouble swallowing and voice change.   Respiratory:  Positive for cough.   Skin: Negative for color change, pallor, rash and wound.    Objective:  BP 114/76 mmHg  Pulse 74  Temp(Src) 98.1 F (36.7 C) (Oral)  Ht 5\' 1"  (1.549 m)  Wt 113 lb 8 oz (51.483 kg)  BMI 21.46 kg/m2  SpO2 99%  Physical Exam  Constitutional: She is oriented to person, place, and time. She appears well-developed and well-nourished. No distress.  HENT:  Head: Normocephalic and atraumatic.  Right Ear: External ear normal.  Left Ear: External ear normal.  Mouth/Throat: Oropharynx is clear and moist.  Very hard of hearing  Cardiovascular: Normal rate and regular rhythm.   Pulmonary/Chest: Effort normal and breath sounds normal. No respiratory distress. She has no wheezes. She has no rales. She exhibits no tenderness.  Neurological: She is alert and oriented to person, place, and time.  Skin: Skin is warm and dry. No rash noted. She is not diaphoretic.  Psychiatric: She has a normal mood and affect. Her behavior is normal. Judgment and thought content normal.   Assessment & Plan:   Zabella was seen today for follow-up.  Diagnoses and all orders for this visit:  Viral URI with cough  Allergic drug rash  I have discontinued Ms. Douse's ELIQUIS, amoxicillin-clavulanate, predniSONE, and benzonatate. I am also having her maintain her magnesium oxide, carvedilol, apixaban, flecainide, rosuvastatin, esomeprazole, and [place].  No orders of the defined types were placed in this encounter.     Follow-up: Return if symptoms worsen or fail to improve.

## 2015-08-14 NOTE — Patient Instructions (Signed)
Your cough is likely due to mucous, which is a normal part of getting over a virus or bacteria.  Mucinex (plain) over the counter to get that mucous out.   Lightly use warm wet wash cloth to exfoliate or a gentle exfoliation product to get the dead skin off. Finish with a lotion like Eucerin that you are currently using.

## 2015-08-15 NOTE — Assessment & Plan Note (Addendum)
Pt stopped these meds and skin has continued to peel. No mucosal areas affected  No systemic symptoms- breathing/swallowing/fever ect... Pt advised to try Eucerin OTC on skin and Aveeno on face Stop prednisone FU in 1 week.

## 2015-08-19 DIAGNOSIS — J069 Acute upper respiratory infection, unspecified: Secondary | ICD-10-CM | POA: Insufficient documentation

## 2015-08-19 DIAGNOSIS — B9789 Other viral agents as the cause of diseases classified elsewhere: Principal | ICD-10-CM

## 2015-08-19 NOTE — Assessment & Plan Note (Signed)
Unknown etiology Patient is improving nicely For the flaking skin advised light exfoliation with warm washcloth and use of Eucerin over-the-counter

## 2015-08-19 NOTE — Assessment & Plan Note (Addendum)
Improving Will treat conservatively due to probable viral nature Mucinex plain encouraged OTC  Advised to continue probiotic FU prn worsening/failure to improve.   I spent 27 minutes face-to-face with the patient and daughter with greater than 50% of time spent on counseling, discussing treatment regimens, examining patient.

## 2015-08-24 DIAGNOSIS — C44629 Squamous cell carcinoma of skin of left upper limb, including shoulder: Secondary | ICD-10-CM | POA: Diagnosis not present

## 2015-08-24 DIAGNOSIS — L905 Scar conditions and fibrosis of skin: Secondary | ICD-10-CM | POA: Diagnosis not present

## 2015-09-05 ENCOUNTER — Other Ambulatory Visit: Payer: Self-pay | Admitting: Nurse Practitioner

## 2015-09-07 DIAGNOSIS — L821 Other seborrheic keratosis: Secondary | ICD-10-CM | POA: Diagnosis not present

## 2015-09-11 ENCOUNTER — Other Ambulatory Visit: Payer: Self-pay | Admitting: Cardiovascular Disease

## 2015-09-11 NOTE — Telephone Encounter (Signed)
Pt requesting refill amiodarone 400 mg . Ok to refill?

## 2015-09-26 NOTE — Telephone Encounter (Signed)
    Pt was to stop amiodarone per Telephone note below.          Stana Bunting, RN at 09/28/2014 9:46 AM     Status: Signed       Expand All Collapse All   Spoke w/ pt's daughter. She states that the "cleaning lady" took my message yesterday. Advised her that the person I spoke w/ identified herself as pt's daughter.  She states that she did some research on the internet and read that flecainide has to be started in the hospital under close supervision.  Advised her that per Dr. Rockey Situ, pt will be on a small dose and there is no need for hospitalization.  Advised her to stop the amiodarone, as this is affecting pt's thyroid.  She verbalizes understanding, though she does have several more questions about pt's thyroid. Advised her to speak w/ pt's PCP about this.  She asks about the various side effects of amiodarone and if this could affect pt's vision. Advised her to speak w/ pt's ophthalmologist, as pt is sched for upcoming eye surgery.  Spent a significant amount of time discussing pt's meds, potential side effects and going over Dr. Donivan Scull recommendations w/ pt's daughter.  Advised her to speak w/ pharmacist about some of her questions regarding flecainide, as she states "there is a lot of information that I read on the internet and I want to go over it all". She states that she will have pt stop amiodarone and remove it from pt's home.  She will have pt start flecainide tomorrow or Friday, as they have not picked this rx up yet and it has not arrived from [place].  She is appreciative and will call back w/ any further questions or concerns.

## 2015-10-02 ENCOUNTER — Telehealth: Payer: Self-pay | Admitting: *Deleted

## 2015-10-02 MED ORDER — FLECAINIDE ACETATE 50 MG PO TABS: 50.0000 mg | ORAL_TABLET | Freq: Two times a day (BID) | ORAL | Status: DC

## 2015-10-02 NOTE — Telephone Encounter (Signed)
Patient has requested a medication refill for flecainide acet [facility]

## 2015-10-02 NOTE — Telephone Encounter (Signed)
Refilled

## 2015-10-03 ENCOUNTER — Telehealth: Payer: Self-pay | Admitting: Cardiovascular Disease

## 2015-10-03 MED ORDER — FLECAINIDE ACETATE 50 MG PO TABS: 50.0000 mg | ORAL_TABLET | Freq: Two times a day (BID) | ORAL | Status: DC

## 2015-10-03 NOTE — Telephone Encounter (Signed)
Spoke with Immunologist at [facility] to verify that the patient will receive the correct refill on Flecainide 50 mg one tablet twice a day with #180 tablets and 3 refills.

## 2015-10-03 NOTE — Telephone Encounter (Signed)
Pt daughter calling having a medication question Pt has run out Flecainide Only had a year prescription  [facility] day supply Only has enough to last until 10/07/15 Problem that is worry them is that even if we ordered it, it may not get to patient in time Would like [place] to send some to local pharmacy [facility] on [address] just until mail order comes to them.  Please advise.   They are all a bit confused. She states another sister (pt daughter) may have already done this. Also may have send to PCP on accident. This is why she is calling us now to get this straightened out.

## 2015-10-03 NOTE — Telephone Encounter (Signed)
Spoke with the daughter.

## 2015-10-03 NOTE — Telephone Encounter (Signed)
Patient daughter questioned if this office can refill this script, even if it was originally written by Dr. Gwenyth Ober office.  Please advise  Hassan Rowan [phone]

## 2015-10-03 NOTE — Telephone Encounter (Signed)
Spoke with April (pharmacist) at [facility] that the patient needs enough medication of flecainide 60 tablets to get her through until the mail order comes in. April did verify that she received the correct Rx for Flecainide 50 mg twice a day with 60 tablets and 0 refills.

## 2015-10-03 NOTE — Telephone Encounter (Signed)
Spoke with Hassan Rowan (daughter) a refill sent for Flecainide 90 day to [facility].  Refill sent for Flecainide 30 day supply as well to [place] since the patient will not receive the medication in time and will run out.

## 2015-12-08 ENCOUNTER — Other Ambulatory Visit: Payer: Self-pay

## 2015-12-22 ENCOUNTER — Telehealth: Payer: Self-pay | Admitting: *Deleted

## 2015-12-22 ENCOUNTER — Telehealth: Payer: Self-pay | Admitting: Family Medicine

## 2015-12-22 NOTE — Telephone Encounter (Deleted)
Patients daughter requested a Rx refill for her mothers Nexium [facility]

## 2015-12-22 NOTE — Telephone Encounter (Signed)
Called and authorized refill.

## 2015-12-22 NOTE — Telephone Encounter (Signed)
Pt's daughter called [facility]. They say that pt's provider must renew a 90 day supply of her  NEXIUM 40 MG capsule. Please call call [facility] at [phone], just call to authorize refill.

## 2016-01-04 ENCOUNTER — Telehealth: Payer: Self-pay | Admitting: *Deleted

## 2016-01-04 MED ORDER — ESOMEPRAZOLE MAGNESIUM 40 MG PO CPDR: DELAYED_RELEASE_CAPSULE | ORAL | 2 refills | Status: DC

## 2016-01-04 NOTE — Telephone Encounter (Signed)
Spoke with Hassan Rowan and refilled the nexium.

## 2016-01-04 NOTE — Telephone Encounter (Signed)
Patient stated she did not call and does not need any refills for nexium.

## 2016-01-04 NOTE — Telephone Encounter (Signed)
Please call regarding medication. Hassan Rowan daughter @ [phone] or call [phone]. Thank you!

## 2016-01-04 NOTE — Telephone Encounter (Signed)
Patient has requested a medication refill for Nexium  [facility]

## 2016-01-05 ENCOUNTER — Other Ambulatory Visit: Payer: Self-pay | Admitting: Family Medicine

## 2016-01-05 ENCOUNTER — Telehealth: Payer: Self-pay | Admitting: *Deleted

## 2016-01-05 NOTE — Telephone Encounter (Signed)
Patient daughter Hassan Rowan called and stated she has a few questions about the medciation Nexium that was sent to [facility]. She is requesting a call back. Her call back number is [phone] or [phone].

## 2016-01-05 NOTE — Telephone Encounter (Signed)
[place], [place]

## 2016-01-05 NOTE — Telephone Encounter (Signed)
What pharmacy ?

## 2016-01-05 NOTE — Telephone Encounter (Signed)
Patients daughter, Hassan Rowan, states that insurance will not cover Nexium anymore.  Hassan Rowan states that insurance has given Omeprazole, Pantoprazole and rabeprazole as alternative medications.  Hassan Rowan states that the patient has not tried any of these medications in the past.  Rather that completing a prior authorization Hassan Rowan is requesting to try one of these instead, a 30 day supply would need to sent to local pharmacy for trial.

## 2016-01-08 ENCOUNTER — Other Ambulatory Visit: Payer: Self-pay | Admitting: Family Medicine

## 2016-01-08 MED ORDER — PANTOPRAZOLE SODIUM 40 MG PO TBEC: 40.0000 mg | DELAYED_RELEASE_TABLET | Freq: Every day | ORAL | 0 refills | Status: DC

## 2016-01-08 NOTE — Telephone Encounter (Signed)
Pt daughter called back and need clarification regarding the medication that was sent to pharmacy. Please advise?  Call daughter @ [phone]. Thank you!

## 2016-01-08 NOTE — Telephone Encounter (Signed)
Rx sent 

## 2016-01-09 NOTE — Telephone Encounter (Signed)
Spoke with daughter yesterday, advised that the new medication for acid reflux, Protonix, was sent to [facility] as requested.

## 2016-01-16 DIAGNOSIS — L570 Actinic keratosis: Secondary | ICD-10-CM | POA: Diagnosis not present

## 2016-01-16 DIAGNOSIS — Z08 Encounter for follow-up examination after completed treatment for malignant neoplasm: Secondary | ICD-10-CM | POA: Diagnosis not present

## 2016-01-16 DIAGNOSIS — L821 Other seborrheic keratosis: Secondary | ICD-10-CM | POA: Diagnosis not present

## 2016-01-16 DIAGNOSIS — Z85828 Personal history of other malignant neoplasm of skin: Secondary | ICD-10-CM | POA: Diagnosis not present

## 2016-01-16 DIAGNOSIS — X32XXXA Exposure to sunlight, initial encounter: Secondary | ICD-10-CM | POA: Diagnosis not present

## 2016-01-18 ENCOUNTER — Ambulatory Visit (INDEPENDENT_AMBULATORY_CARE_PROVIDER_SITE_OTHER): Payer: Medicare Other | Admitting: Family Medicine

## 2016-01-18 ENCOUNTER — Encounter: Payer: Self-pay | Admitting: Family Medicine

## 2016-01-18 DIAGNOSIS — I5032 Chronic diastolic (congestive) heart failure: Secondary | ICD-10-CM

## 2016-01-18 DIAGNOSIS — E785 Hyperlipidemia, unspecified: Secondary | ICD-10-CM | POA: Diagnosis not present

## 2016-01-18 DIAGNOSIS — Z7901 Long term (current) use of anticoagulants: Secondary | ICD-10-CM

## 2016-01-18 DIAGNOSIS — I4891 Unspecified atrial fibrillation: Secondary | ICD-10-CM

## 2016-01-18 DIAGNOSIS — K219 Gastro-esophageal reflux disease without esophagitis: Secondary | ICD-10-CM

## 2016-01-18 LAB — LIPID PANEL
CHOLESTEROL: 184 mg/dL (ref 0–200)
HDL: 68.3 mg/dL (ref >39.00)
LDL CALC: 96 mg/dL (ref 0–99)
NonHDL: 115.47
TRIGLYCERIDES: 97 mg/dL (ref 0.0–149.0)
Total CHOL/HDL Ratio: 3
VLDL: 19.4 mg/dL (ref 0.0–40.0)

## 2016-01-18 LAB — COMPREHENSIVE METABOLIC PANEL
ALBUMIN: 4.2 g/dL (ref 3.5–5.2)
ALK PHOS: 41 U/L (ref 39–117)
ALT: 13 U/L (ref 0–35)
AST: 21 U/L (ref 0–37)
BUN: 12 mg/dL (ref 6–23)
CALCIUM: 9.8 mg/dL (ref 8.4–10.5)
CHLORIDE: 101 meq/L (ref 96–112)
CO2: 29 mEq/L (ref 19–32)
Creatinine, Ser: 1.04 mg/dL (ref 0.40–1.20)
GFR: 52.65 mL/min — AB (ref >60.00)
Glucose, Bld: 96 mg/dL (ref 70–99)
POTASSIUM: 4.9 meq/L (ref 3.5–5.1)
SODIUM: 134 meq/L — AB (ref 135–145)
TOTAL PROTEIN: 7 g/dL (ref 6.0–8.3)
Total Bilirubin: 0.5 mg/dL (ref 0.2–1.2)

## 2016-01-18 LAB — CBC
HCT: 39.5 % (ref 36.0–46.0)
HEMOGLOBIN: 13 g/dL (ref 12.0–15.0)
MCHC: 32.9 g/dL (ref 30.0–36.0)
MCV: 87.8 fl (ref 78.0–100.0)
Platelets: 277 10*3/uL (ref 150.0–400.0)
RBC: 4.5 Mil/uL (ref 3.87–5.11)
RDW: 14.2 % (ref 11.5–15.5)
WBC: 7.1 10*3/uL (ref 4.0–10.5)

## 2016-01-18 MED ORDER — PANTOPRAZOLE SODIUM 40 MG PO TBEC: 40.0000 mg | DELAYED_RELEASE_TABLET | Freq: Every day | ORAL | 3 refills | Status: DC

## 2016-01-18 NOTE — Assessment & Plan Note (Signed)
Obtaining lipid panel today. Continue Crestor at this time. May be able to stop given advanced age.

## 2016-01-18 NOTE — Progress Notes (Signed)
Pre visit review using our clinic review tool, if applicable. No additional management support is needed unless otherwise documented below in the visit note. 

## 2016-01-18 NOTE — Progress Notes (Signed)
Subjective:  Patient ID: Kylie May, female    DOB: [birth year]  Age: 82 y.o. MRN: [id number]  CC: Follow up   HPI:  82 year old female with diastolic heart failure, A. fib, severe hearing loss, GERD, hyperlipidemia presents for follow-up.  Atrial fib  Stable on Coreg, flecainide, Eliquis.  CHF  Stable.  Doing well at this time on Coreg.  Hyperlipidemia  Needs labs.  Currently on Crestor and tolerating.  Unsure of benefit at her age.  GERD  Stable.  Recent switch to protonix.   Social Hx   Social History   Social History  . Marital status: Widowed    Spouse name: N/A  . Number of children: 3  . Years of education: N/A   Occupational History  . 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 Smoking status: Former Smoker    Quit date: 06/04/1951  . Smokeless tobacco: Never Used     Comment: Smoked 1945-1953 , up to 2 cigarettes/day  . Alcohol use 4.2 oz/week    7 Glasses of wine per week  . Drug use: No  . Sexual activity: Not Asked   Other Topics Concern  . None   Social History Narrative   Has living will    Would want daughter Santiago Glad to be health care POA   Not sure about DNR--definitely doesn't want prolonged life support   Not sure about tube feeds either (but probably not)         Review of Systems  Constitutional: Negative.   Gastrointestinal:       Occasional soft/liquid stool.   Objective:  BP 137/77 (BP Location: Left Arm, Patient Position: Sitting, Cuff Size: Normal)   Pulse 63   Temp 97.9 F (36.6 C) (Oral)   Wt 109 lb (49.4 kg)   SpO2 98%   BMI 20.60 kg/m   BP/Weight 01/18/2016 08/01/6008 02/03/2354  Systolic BP 732 202 542  Diastolic BP 77 76 82  Wt. (Lbs) 109 113.5 117  BMI 20.6 21.46 22.47   Physical Exam  Constitutional: She appears well-developed. No distress.  HENT:  Extremely hard of hearing.  Cardiovascular: Normal rate and regular rhythm.     Pulmonary/Chest: Effort normal and breath sounds normal.  Neurological: She is alert.  Psychiatric: She has a normal mood and affect.  Vitals reviewed.  Lab Results  Component Value Date   WBC 11.9 (H) 07/28/2015   HGB 12.4 07/28/2015   HCT 37.3 07/28/2015   PLT 321.0 07/28/2015   GLUCOSE 93 07/28/2015   CHOL 197 02/17/2014   TRIG 94.0 02/17/2014   HDL 69.90 02/17/2014   LDLDIRECT 121.4 06/17/2011   LDLCALC 108 (H) 02/17/2014   ALT 15 09/16/2014   AST 20 09/16/2014   NA 136 07/28/2015   K 4.1 07/28/2015   CL 103 07/28/2015   CREATININE 1.04 07/28/2015   BUN 24 (H) 07/28/2015   CO2 26 07/28/2015   TSH 16.890 (H) 10/19/2014   INR 1.2 09/16/2014   Assessment & Plan:   Problem List Items Addressed This Visit    Hyperlipidemia    Obtaining lipid panel today. Continue Crestor at this time. May be able to stop given advanced age.      Relevant Orders   Lipid Profile   GERD (gastroesophageal reflux disease)    Stable. Continue Protonix.      Relevant Medications   pantoprazole (PROTONIX) 40 MG tablet   Chronic diastolic CHF (  congestive heart failure) (HCC)    Euvolemic on exam. Continue Coreg.      A-fib (HCC)    Stable. Followed by cardiology. Continue Coreg, Flecainide, Eliquis.      Relevant Orders   Comp Met (CMET)    Other Visit Diagnoses    Chronic anticoagulation       Relevant Orders   CBC     Meds ordered this encounter  Medications  . pantoprazole (PROTONIX) 40 MG tablet    Sig: Take 1 tablet (40 mg total) by mouth daily.    Dispense:  90 tablet    Refill:  3   Follow-up: 6 months.   [place]

## 2016-01-18 NOTE — Assessment & Plan Note (Signed)
Stable ?Continue Protonix ?

## 2016-01-18 NOTE — Patient Instructions (Signed)
You're doing fine.  Continue your current medications.  Follow up in 6 months.  Take care  Dr. Lacinda Axon

## 2016-01-18 NOTE — Assessment & Plan Note (Signed)
Euvolemic on exam. Continue Coreg.

## 2016-01-18 NOTE — Assessment & Plan Note (Signed)
Stable. Followed by cardiology. Continue Coreg, Flecainide, Eliquis.

## 2016-01-20 ENCOUNTER — Other Ambulatory Visit: Payer: Self-pay | Admitting: Family Medicine

## 2016-03-03 DIAGNOSIS — Z23 Encounter for immunization: Secondary | ICD-10-CM | POA: Diagnosis not present

## 2016-03-06 ENCOUNTER — Telehealth: Payer: Self-pay | Admitting: *Deleted

## 2016-03-06 NOTE — Telephone Encounter (Signed)
Pt stopped by for a copy of her labs and a to report her flu shot  from 10/02 from a walk in clinic

## 2016-03-28 DIAGNOSIS — H2513 Age-related nuclear cataract, bilateral: Secondary | ICD-10-CM | POA: Diagnosis not present

## 2016-04-03 ENCOUNTER — Telehealth: Payer: Self-pay | Admitting: Family Medicine

## 2016-04-03 NOTE — Telephone Encounter (Signed)
I called pt and left a vm to call office to sch AWV. Thank you! °

## 2016-04-14 ENCOUNTER — Other Ambulatory Visit: Payer: Self-pay | Admitting: Cardiovascular Disease

## 2016-04-18 ENCOUNTER — Encounter: Payer: Self-pay | Admitting: Cardiovascular Disease

## 2016-04-18 ENCOUNTER — Ambulatory Visit (INDEPENDENT_AMBULATORY_CARE_PROVIDER_SITE_OTHER): Payer: Medicare Other | Admitting: Cardiovascular Disease

## 2016-04-18 VITALS — BP 130/76 | HR 71 | Ht 61.0 in | Wt 110.5 lb

## 2016-04-18 DIAGNOSIS — S61452A Open bite of left hand, initial encounter: Secondary | ICD-10-CM | POA: Diagnosis not present

## 2016-04-18 DIAGNOSIS — W540XXA Bitten by dog, initial encounter: Secondary | ICD-10-CM | POA: Diagnosis not present

## 2016-04-18 DIAGNOSIS — I480 Paroxysmal atrial fibrillation: Secondary | ICD-10-CM | POA: Diagnosis not present

## 2016-04-18 DIAGNOSIS — E7800 Pure hypercholesterolemia, unspecified: Secondary | ICD-10-CM | POA: Diagnosis not present

## 2016-04-18 DIAGNOSIS — Z23 Encounter for immunization: Secondary | ICD-10-CM | POA: Diagnosis not present

## 2016-04-18 DIAGNOSIS — R296 Repeated falls: Secondary | ICD-10-CM | POA: Insufficient documentation

## 2016-04-18 DIAGNOSIS — Z7189 Other specified counseling: Secondary | ICD-10-CM

## 2016-04-18 DIAGNOSIS — I5032 Chronic diastolic (congestive) heart failure: Secondary | ICD-10-CM | POA: Diagnosis not present

## 2016-04-18 DIAGNOSIS — M7989 Other specified soft tissue disorders: Secondary | ICD-10-CM

## 2016-04-18 DIAGNOSIS — M79645 Pain in left finger(s): Secondary | ICD-10-CM | POA: Diagnosis not present

## 2016-04-18 NOTE — Progress Notes (Signed)
Cardiology Office Note  Date:  04/18/2016   ID:  Kylie May, DOB 09/06/1921, MRN [id number]  PCP:  Coral Spikes, DO   Chief Complaint  Patient presents with  . other    12 month follow up . meds reviewed verbally with patient. "doing well."     HPI:  82 year old female with history of paroxysmal asymptomatic  a-fib,  HTN, hiatal hernia, osteopenia, degenerative joint disease, and scoliosis who was admitted to [facility] 1/7-1/10/16 for new onset a-fib and mild diastolic CHF, who presents today for follow-up of her atrial fibrillation. Since starting amiodarone, she  had a climb in her TSH , peak of 19, with repeat down to 16  Amiodarone was held and she was started on flecainide  Previous  fall last week, missed a step and fell onto her right side, suffered pelvic fracture in 2 areas per the patient.  She has several daughters, none of which are here today during her visit We called the pharmacy to confirm her medications, they did not have record of her, [facility]. Finally contacted her daughter over the phone who confirmed her medications  she reports that a dog bit her left hand middle finger She does not know when, reports it did puncture the skin Seemed to heal up at then started swelling more in the past week or so him a more tender. Family thought it felt hot  Long history of falls Does not do any regular exercise otherwise has no complaints Denies any tachycardia concerning for arrhythmia No chest tightness, no shortness of breath  EKG on today's visit shows normal sinus rhythm with rate 71 bpm, nonspecific ST and T wave abnormality  Other past medical history Echocardiogram 06/09/2014 shows normal LV function, severely dilated left atrium, severe MR, moderate to severe TR  Beginning after Christmas 2015 she developed worsening fatigue as well as lower extremity edema.  She was seen at urgent care and noted to have an elevated heart rate  and was brought to [facility] for further evaluation.  She was found to be in a-fib with RVR. She was treated initially with IV Cardizem, however her BP became soft and was changed to amiodarone gtt. She did convert to NSR and was continued on po amiodarone at 400 mg bid at discharge.  She was placed on apixaban 2.5 mg bid (age 47 and weight 54.3 kg).    PMH:   has a past medical history of A-fib ([place]); Acid reflux disease; Arrhythmia; Chronic diastolic CHF (congestive heart failure) ([place]); Compression fracture of lumbar vertebra ([place]); Diverticulitis (01-2008); Diverticulosis; Esophageal stricture (2006); Femoral bruit; Fibrocystic breast disease; Hiatal hernia; Hypercholesterolemia; Mitral regurgitation; Mitral valve prolapse; Osteoporosis; Pancreatitis (07-2008); Schatzki's ring; and Skin cancer.  PSH:    Past Surgical History:  Procedure Laterality Date  . COLONOSCOPY  2006  . Epidural steroids     x1  . ESOPHAGEAL DILATION  2006  . G3 P4 (twins)    . SKIN CANCER EXCISION      Current Outpatient Prescriptions  Medication Sig Dispense Refill  . apixaban (ELIQUIS) 2.5 MG TABS tablet Take 1 tablet (2.5 mg total) by mouth 2 (two) times daily. 60 tablet 0  . carvedilol (COREG) 3.125 MG tablet Take 1 tablet (3.125 mg total) by mouth 2 (two) times daily with a meal. 60 tablet 0  . flecainide (TAMBOCOR) 50 MG tablet Take 1 tablet (50 mg total) by mouth 2 (two) times daily. 180 tablet 3  .  Lactobacillus-Inulin ([place]) CAPS Take 1 capsule by mouth daily. 21 capsule 0  . magnesium oxide (MAG-OX) 400 MG tablet Take 1 tablet (400 mg total) by mouth daily. 30 tablet 0  . pantoprazole (PROTONIX) 40 MG tablet Take 1 tablet (40 mg total) by mouth daily. 90 tablet 3  . rosuvastatin (CRESTOR) 20 MG tablet TAKE 1 TABLET DAILY 90 tablet 0   No current facility-administered medications for this visit.      Allergies:   Metronidazole   Social History:  The patient  reports that she  quit smoking about 64 years ago. She has never used smokeless tobacco. She reports that she drinks about 4.2 oz of alcohol per week . She reports that she does not use drugs.   Family History:   family history includes Breast cancer in her maternal aunt; Heart attack (age of onset: 75) in her daughter; Heart attack (age of onset: 32) in her sister; Hepatitis in her daughter; Lung disease in her mother.    Review of Systems: Review of Systems  Constitutional: Negative.   Respiratory: Negative.   Cardiovascular: Negative.   Gastrointestinal: Negative.   Musculoskeletal: Negative.   Neurological: Negative.   Psychiatric/Behavioral: Negative.   All other systems reviewed and are negative.    PHYSICAL EXAM: VS:  BP 130/76 (BP Location: Left Arm, Patient Position: Sitting, Cuff Size: Normal)   Pulse 71   Ht 5\' 1"  (1.549 m)   Wt 110 lb 8 oz (50.1 kg)   BMI 20.88 kg/m  , BMI Body mass index is 20.88 kg/m. GEN: Well nourished, well developed, in no acute distress , hard of hearing HEENT: normal  Neck: no JVD, carotid bruits, or masses Cardiac: RRR; no murmurs, rubs, or gallops,no edema  Respiratory:  clear to auscultation bilaterally, normal work of breathing GI: soft, nontender, nondistended, + BS MS: no deformity or atrophy , left hand middle finger swollen, tender to palpation Skin: warm and dry, no rash Neuro:  Strength and sensation are intact Psych: euthymic mood, full affect    Recent Labs: 01/18/2016: ALT 13; BUN 12; Creatinine, Ser 1.04; Hemoglobin 13.0; Platelets 277.0; Potassium 4.9; Sodium 134    Lipid Panel Lab Results  Component Value Date   CHOL 184 01/18/2016   HDL 68.30 01/18/2016   LDLCALC 96 01/18/2016   TRIG 97.0 01/18/2016      Wt Readings from Last 3 Encounters:  04/18/16 110 lb 8 oz (50.1 kg)  01/18/16 109 lb (49.4 kg)  08/14/15 113 lb 8 oz (51.5 kg)       ASSESSMENT AND PLAN:  Paroxysmal atrial fibrillation (HCC) - Plan: EKG  12-Lead Maintaining normal sinus rhythm, tolerating anticoagulation We'll continue to monitor her falls  Chronic diastolic CHF (congestive heart failure) (HCC) - Plan: EKG 12-Lead Appears euvolemic on today's visit, no changes to her medications  Finger swelling Major issue today is large left hand middle finger, swelling over the past several weeks We have called urgent care, they will evaluate her Suspect she will need antibiotics, follow-up with hand specialist Talked to her daughter on the phone  Pure hypercholesterolemia Encouraged her to stay on her Crestor  Encounter for anticoagulation discussion and counseling She feels comfortable staying on her anticoagulation She does have history of falls  Frequent falls   Total encounter time more than 25 minutes  Greater than 50% was spent in counseling and coordination of care with the patient  Disposition:   F/U  6 months   Orders Placed  This Encounter  Procedures  . EKG 12-Lead     Signed, Esmond Plants, M.D., Ph.D. 04/18/2016  [place], [place]

## 2016-04-18 NOTE — Patient Instructions (Signed)
We will refer you to the urgent care  Medication Instructions:   No medication changes made  Labwork:  No new labs needed  Testing/Procedures:  No further testing at this time   I recommend watching educational videos on topics of interest to you at:       [email or web address]  Enter code: [email or web address]    Follow-Up: It was a pleasure seeing you in the office today. Please call us if you have new issues that need to be addressed before your next appt.  [phone]  Your physician wants you to follow-up in: 6 months.  You will receive a reminder letter in the mail two months in advance. If you don't receive a letter, please call our office to schedule the follow-up appointment.  If you need a refill on your cardiac medications before your next appointment, please call your pharmacy.

## 2016-05-09 DIAGNOSIS — Z01419 Encounter for gynecological examination (general) (routine) without abnormal findings: Secondary | ICD-10-CM | POA: Diagnosis not present

## 2016-05-09 DIAGNOSIS — Z6821 Body mass index (BMI) 21.0-21.9, adult: Secondary | ICD-10-CM | POA: Diagnosis not present

## 2016-05-17 ENCOUNTER — Telehealth: Payer: Self-pay

## 2016-05-17 NOTE — Telephone Encounter (Signed)
Patient walked in to the office, she has been on Protonix for the last few months dues to insurance not letting her take the Nexium that she has been taking for years. She has noticed that she is having a upset stomach and slight pains in her lower abdomen more frequently.  Researched and noted that on a result note in August she was advised to not take the Protonix unless she was having a GERD reflux incident as it seems she is not having symptoms.  I asked her if her symptoms are occurring and she agreed that she has never really had symptoms, but now is having more with the new medication.  Conferred with PCP, and he advise me to tell patient to discontinue the medication for the next 3-4 days and to call the office if her symptoms have not resolved on Monday or Tuesday.  If not resolved we will schedule an appt then to see PCP.  Patient verbalized understanding and will stop the Medication for now.

## 2016-05-30 ENCOUNTER — Ambulatory Visit (INDEPENDENT_AMBULATORY_CARE_PROVIDER_SITE_OTHER): Payer: Medicare Other

## 2016-05-30 VITALS — BP 128/72 | HR 88 | Temp 98.3°F | Resp 14 | Ht 60.0 in | Wt 109.0 lb

## 2016-05-30 DIAGNOSIS — Z0000 Encounter for general adult medical examination without abnormal findings: Secondary | ICD-10-CM | POA: Diagnosis not present

## 2016-05-30 NOTE — Progress Notes (Signed)
Care was provided under my supervision. I agree with the management as indicated in the note.  Janeene Sand DO  

## 2016-05-30 NOTE — Progress Notes (Signed)
Subjective:   Kylie May is a 82 y.o. female who presents for Medicare Annual (Subsequent) preventive examination.  Review of Systems:  No ROS.  Medicare Wellness Visit.  Cardiac Risk Factors include: advanced age (>12men, >91 women);hypertension     Objective:     Vitals: BP 128/72 (BP Location: Left Arm, Patient Position: Sitting, Cuff Size: Small)   Pulse 88   Temp 98.3 F (36.8 C) (Oral)   Resp 14   Ht 5' (1.524 m)   Wt 109 lb (49.4 kg)   SpO2 96%   BMI 21.29 kg/m   Body mass index is 21.29 kg/m.   Tobacco History  Smoking Status  . Former Smoker  . Quit date: 06/04/1951  Smokeless Tobacco  . Never Used    Comment: Smoked 1945-1953 , up to 2 cigarettes/day     Counseling given: Not Answered   Past Medical History:  Diagnosis Date  . A-fib ([place])   . Acid reflux disease   . Arrhythmia   . Chronic diastolic CHF (congestive heart failure) ([place])   . Compression fracture of lumbar vertebra ([place])   . Diverticulitis 01-2008   [facility] [place]  . Diverticulosis   . Esophageal stricture 2006  . Femoral bruit    Right  . Fibrocystic breast disease   . Hiatal hernia   . Hypercholesterolemia    [place] study LDL goal = < 160  . Mitral regurgitation    a. 06/2014 EF 55-60%, elevated end-diastolic pressures, dilated LA at 4.3 cm, mildly dilated RA, severe mitral regurgitation, mild aortic sclerosis without stenosis, mod-severe TR  . Mitral valve prolapse   . Osteoporosis    Dr Matthew Saras  . Pancreatitis 07-2008   Hospitalized   . Schatzki's ring   . Skin cancer    facial x 2. [name]   Past Surgical History:  Procedure Laterality Date  . COLONOSCOPY  2006  . Epidural steroids     x1  . ESOPHAGEAL DILATION  2006  . G3 P4 (twins)    . SKIN CANCER EXCISION     Family History  Problem Relation Age of Onset  . Breast cancer Maternal Aunt   . Hepatitis Daughter     C  . Lung disease Mother     lung tumor  . Heart attack Daughter 36    S/P stents    . Heart attack Sister 21  . Diabetes Neg Hx   . Stroke Neg Hx    History  Sexual Activity  . Sexual activity: Not on file    Outpatient Encounter Prescriptions as of 05/30/2016  Medication Sig  . apixaban (ELIQUIS) 2.5 MG TABS tablet Take 1 tablet (2.5 mg total) by mouth 2 (two) times daily.  . carvedilol (COREG) 3.125 MG tablet Take 1 tablet (3.125 mg total) by mouth 2 (two) times daily with a meal.  . flecainide (TAMBOCOR) 50 MG tablet Take 1 tablet (50 mg total) by mouth 2 (two) times daily.  . Lactobacillus-Inulin ([place]) CAPS Take 1 capsule by mouth daily.  . magnesium oxide (MAG-OX) 400 MG tablet Take 1 tablet (400 mg total) by mouth daily.  . pantoprazole (PROTONIX) 40 MG tablet Take 1 tablet (40 mg total) by mouth daily.  . rosuvastatin (CRESTOR) 20 MG tablet TAKE 1 TABLET DAILY   No facility-administered encounter medications on file as of 05/30/2016.     Activities of Daily Living In your present state of health, do you have any difficulty performing the following  activities: 05/30/2016  Hearing? Y  Vision? N  Difficulty concentrating or making decisions? N  Walking or climbing stairs? N  Dressing or bathing? N  Doing errands, shopping? N  Preparing Food and eating ? N  Using the Toilet? N  In the past six months, have you accidently leaked urine? N  Do you have problems with loss of bowel control? N  Managing your Medications? N  Managing your Finances? N  Housekeeping or managing your Housekeeping? N  Some recent data might be hidden    Patient Care Team: Coral Spikes, DO as PCP - General (Family Medicine) Minna Merritts, MD as Consulting Physician (Cardiology)    Assessment:    This is a routine wellness examination for [place]. The goal of the wellness visit is to assist the patient how to close the gaps in care and create a preventative care plan for the patient.   Osteoporosis reviewed.  Medications reviewed; taking  without issues or barriers.  Safety issues reviewed; lives alone.  Smoke detectors in the home. No firearms in the home. Wears seatbelts when driving or riding with others. No violence in the home.  No identified risk were noted; The patient was oriented x 3; appropriate in dress and manner and no objective failures at ADL's or IADL's.   BMI; discussed the importance of a healthy diet, water intake and exercise. Educational material provided.  Patient Concerns: Stomach feels better when holding protonix medication; will continue to hold until follow up with PCP.  Exercise Activities and Dietary recommendations Current Exercise Habits: Home exercise routine, Type of exercise: walking, Time (Minutes): 30, Frequency (Times/Week): 3, Weekly Exercise (Minutes/Week): 90, Intensity: Mild  Goals    . Increase water intake      Fall Risk Fall Risk  05/30/2016 02/17/2014 11/10/2012  Falls in the past year? Yes Yes No  Number falls in past yr: 2 or more 1 -  Injury with Fall? Yes Yes -  Risk Factor Category  High Fall Risk High Fall Risk -  Risk for fall due to : History of fall(s) History of fall(s) -  Follow up Education provided;Falls prevention discussed - -   Depression Screen PHQ 2/9 Scores 05/30/2016 02/17/2014 11/10/2012  PHQ - 2 Score 0 0 0     Cognitive Function MMSE - Mini Mental State Exam 05/30/2016  Orientation to time 5  Orientation to Place 5  Registration 3  Attention/ Calculation 5  Recall 3  Language- name 2 objects 2  Language- repeat 1  Language- follow 3 step command 3  Language- read & follow direction 1  Write a sentence 1  Copy design 1  Total score 30        Immunization History  Administered Date(s) Administered  . H1N1 08/08/2008  . Influenza,inj,Quad PF,36+ Mos 02/17/2014  . Influenza-Unspecified 03/03/2016  . Pneumococcal Conjugate-13 02/17/2014  . Pneumococcal Polysaccharide-23 11/10/2012  . Td 01/09/2010  . Tdap 04/18/2016   Screening  Tests Health Maintenance  Topic Date Due  . ZOSTAVAX  11/04/1983  . DEXA SCAN  11/03/1988  . TETANUS/TDAP  01/10/2020  . INFLUENZA VACCINE  Completed  . PNA vac Low Risk Adult  Completed      Plan:    End of life planning; Advance aging; Advanced directives discussed. Copy of current HCPOA/Living Will requested.  Medicare Attestation I have personally reviewed: The patient's medical and social history Their use of alcohol, tobacco or illicit drugs Their current medications and supplements The patient's functional  ability including ADLs,fall risks, home safety risks, cognitive, and hearing and visual impairment Diet and physical activities Evidence for depression   The patient's weight, height, BMI, and visual acuity have been recorded in the chart.  I have made referrals and provided education to the patient based on review of the above and I have provided the patient with a written personalized care plan for preventive services.    During the course of the visit the patient was educated and counseled about the following appropriate screening and preventive services:   Vaccines to include Pneumoccal, Influenza, Hepatitis B, Td, Zostavax, HCV  Electrocardiogram  Cardiovascular Disease  Colorectal cancer screening  Bone density screening  Diabetes screening  Glaucoma screening  Mammography/PAP  Nutrition counseling   Patient Instructions (the written plan) was given to the patient.   Varney Biles, LPN  [id number]

## 2016-05-30 NOTE — Patient Instructions (Addendum)
Kylie May , Thank you for taking time to come for your Medicare Wellness Visit. I appreciate your ongoing commitment to your health goals. Please review the following plan we discussed and let me know if I can assist you in the future.   Follow up with Dr. Lacinda Axon as needed.  Call [employer] and/or [facility] in [place] or [place] and ask about coverage for hearing aids and eye wear.  Happy New Year!!!  These are the goals we discussed: Goals    . Increase water intake       This is a list of the screening recommended for you and due dates:  Health Maintenance  Topic Date Due  . Shingles Vaccine  11/04/1983  . DEXA scan (bone density measurement)  11/03/1988  . Tetanus Vaccine  01/10/2020  . Flu Shot  Completed  . Pneumonia vaccines  Completed      Fall Prevention in the Home Introduction Falls can cause injuries. They can happen to people of all ages. There are many things you can do to make your home safe and to help prevent falls. What can I do on the outside of my home?  Regularly fix the edges of walkways and driveways and fix any cracks.  Remove anything that might make you trip as you walk through a door, such as a raised step or threshold.  Trim any bushes or trees on the path to your home.  Use bright outdoor lighting.  Clear any walking paths of anything that might make someone trip, such as rocks or tools.  Regularly check to see if handrails are loose or broken. Make sure that both sides of any steps have handrails.  Any raised decks and porches should have guardrails on the edges.  Have any leaves, snow, or ice cleared regularly.  Use sand or salt on walking paths during winter.  Clean up any spills in your garage right away. This includes oil or grease spills. What can I do in the bathroom?  Use night lights.  Install grab bars by the toilet and in the tub and shower. Do not use towel bars as grab bars.  Use non-skid mats or decals in  the tub or shower.  If you need to sit down in the shower, use a plastic, non-slip stool.  Keep the floor dry. Clean up any water that spills on the floor as soon as it happens.  Remove soap buildup in the tub or shower regularly.  Attach bath mats securely with double-sided non-slip rug tape.  Do not have throw rugs and other things on the floor that can make you trip. What can I do in the bedroom?  Use night lights.  Make sure that you have a light by your bed that is easy to reach.  Do not use any sheets or blankets that are too big for your bed. They should not hang down onto the floor.  Have a firm chair that has side arms. You can use this for support while you get dressed.  Do not have throw rugs and other things on the floor that can make you trip. What can I do in the kitchen?  Clean up any spills right away.  Avoid walking on wet floors.  Keep items that you use a lot in easy-to-reach places.  If you need to reach something above you, use a strong step stool that has a grab bar.  Keep electrical cords out of the way.  Do not use  floor polish or wax that makes floors slippery. If you must use wax, use non-skid floor wax.  Do not have throw rugs and other things on the floor that can make you trip. What can I do with my stairs?  Do not leave any items on the stairs.  Make sure that there are handrails on both sides of the stairs and use them. Fix handrails that are broken or loose. Make sure that handrails are as long as the stairways.  Check any carpeting to make sure that it is firmly attached to the stairs. Fix any carpet that is loose or worn.  Avoid having throw rugs at the top or bottom of the stairs. If you do have throw rugs, attach them to the floor with carpet tape.  Make sure that you have a light switch at the top of the stairs and the bottom of the stairs. If you do not have them, ask someone to add them for you. What else can I do to help prevent  falls?  Wear shoes that:  Do not have high heels.  Have rubber bottoms.  Are comfortable and fit you well.  Are closed at the toe. Do not wear sandals.  If you use a stepladder:  Make sure that it is fully opened. Do not climb a closed stepladder.  Make sure that both sides of the stepladder are locked into place.  Ask someone to hold it for you, if possible.  Clearly mark and make sure that you can see:  Any grab bars or handrails.  First and last steps.  Where the edge of each step is.  Use tools that help you move around (mobility aids) if they are needed. These include:  Canes.  Walkers.  Scooters.  Crutches.  Turn on the lights when you go into a dark area. Replace any light bulbs as soon as they burn out.  Set up your furniture so you have a clear path. Avoid moving your furniture around.  If any of your floors are uneven, fix them.  If there are any pets around you, be aware of where they are.  Review your medicines with your doctor. Some medicines can make you feel dizzy. This can increase your chance of falling. Ask your doctor what other things that you can do to help prevent falls. This information is not intended to replace advice given to you by your health care provider. Make sure you discuss any questions you have with your health care provider. Document Released: 03/16/2009 Document Revised: 10/26/2015 Document Reviewed: 06/24/2014  2017 [employer]

## 2016-06-07 ENCOUNTER — Other Ambulatory Visit: Payer: Self-pay | Admitting: Cardiovascular Disease

## 2016-06-13 ENCOUNTER — Encounter: Payer: Self-pay | Admitting: Family Medicine

## 2016-06-13 ENCOUNTER — Ambulatory Visit (INDEPENDENT_AMBULATORY_CARE_PROVIDER_SITE_OTHER): Payer: Medicare Other | Admitting: Family Medicine

## 2016-06-13 DIAGNOSIS — K219 Gastro-esophageal reflux disease without esophagitis: Secondary | ICD-10-CM

## 2016-06-13 MED ORDER — ESOMEPRAZOLE MAGNESIUM 20 MG PO CPDR: 20.0000 mg | DELAYED_RELEASE_CAPSULE | Freq: Every day | ORAL | 0 refills | Status: DC

## 2016-06-13 NOTE — Progress Notes (Signed)
Subjective:  Patient ID: Kylie May, female    DOB: June 30, 1921  Age: 83 y.o. MRN: [id number]  CC: GERD  HPI:  83 year old female with a history of GERD and esophageal stricture presents with worsening GERD.  GERD  Patient reports worsening of her GERD symptoms.  She reports epigastric discomfort, reflux, and intermittent dysphagia.  She states that her symptoms have been worsening for quite some time, since the switch from Nexium to Protonix. This was due to the fact that her insurance did not cover Nexium.  Patient states that she has stopped the Protonix. She has recently had worsening of her symptoms.  She would like to discuss starting Nexium today.  Social Hx   Social History   Social History  . Marital status: Widowed    Spouse name: N/A  . Number of children: 3  . Years of education: N/A   Occupational History  . 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 Smoking status: Former Smoker    Quit date: 06/04/1951  . Smokeless tobacco: Never Used     Comment: Smoked 1945-1953 , up to 2 cigarettes/day  . Alcohol use 4.2 oz/week    7 Glasses of wine per week  . Drug use: No  . Sexual activity: Not Asked   Other Topics Concern  . None   Social History Narrative   Has living will    Would want daughter Santiago Glad to be health care POA   Not sure about DNR--definitely doesn't want prolonged life support   Not sure about tube feeds either (but probably not)          Review of Systems  Constitutional: Negative.   Gastrointestinal: Positive for abdominal pain.       GERD.   Objective:  BP 128/77   Pulse 71   Temp 98.1 F (36.7 C) (Oral)   Resp 14   Wt 107 lb (48.5 kg)   BMI 20.90 kg/m   BP/Weight 06/13/2016 05/30/2016 [id number]  Systolic BP 0000000 0000000 [id number]  Diastolic BP 77 72 76  Wt. (Lbs) 107 109 110.5  BMI 20.9 21.29 20.88   Physical Exam  Constitutional: She appears well-developed. No  distress.  Cardiovascular: Normal rate and regular rhythm.   Pulmonary/Chest: Effort normal and breath sounds normal.  Abdominal: Soft. She exhibits no distension.  Mild tenderness in the epigastric region.  Neurological: She is alert.  Psychiatric: She has a normal mood and affect.  Vitals reviewed.  Lab Results  Component Value Date   WBC 7.1 01/18/2016   HGB 13.0 01/18/2016   HCT 39.5 01/18/2016   PLT 277.0 01/18/2016   GLUCOSE 96 01/18/2016   CHOL 184 01/18/2016   TRIG 97.0 01/18/2016   HDL 68.30 01/18/2016   LDLDIRECT 121.4 06/17/2011   LDLCALC 96 01/18/2016   ALT 13 01/18/2016   AST 21 01/18/2016   NA 134 (L) 01/18/2016   K 4.9 01/18/2016   CL 101 01/18/2016   CREATININE 1.04 01/18/2016   BUN 12 01/18/2016   CO2 29 01/18/2016   TSH 16.890 (H) 10/19/2014   INR 1.2 09/16/2014    Assessment & Plan:   Problem List Items Addressed This Visit    None      Meds ordered this encounter  Medications  . esomeprazole (NEXIUM) 20 MG capsule    Sig: Take 1 capsule (20 mg total) by mouth daily at 12 noon.    Dispense:  40 capsule    Refill:  0    Follow-up: PRN  [place]

## 2016-06-13 NOTE — Assessment & Plan Note (Signed)
Established problem, worsening. Starting back on Nexium. Samples given today.

## 2016-06-13 NOTE — Patient Instructions (Signed)
Stop protonix.  Start nexium.  Follow up annually.  Take care  Dr. Lacinda Axon

## 2016-06-13 NOTE — Progress Notes (Signed)
Pre visit review using our clinic review tool, if applicable. No additional management support is needed unless otherwise documented below in the visit note. 

## 2016-06-27 DIAGNOSIS — M8588 Other specified disorders of bone density and structure, other site: Secondary | ICD-10-CM | POA: Diagnosis not present

## 2016-06-27 DIAGNOSIS — Z1231 Encounter for screening mammogram for malignant neoplasm of breast: Secondary | ICD-10-CM | POA: Diagnosis not present

## 2016-06-27 DIAGNOSIS — N958 Other specified menopausal and perimenopausal disorders: Secondary | ICD-10-CM | POA: Diagnosis not present

## 2016-07-02 ENCOUNTER — Telehealth: Payer: Self-pay | Admitting: Family Medicine

## 2016-07-02 NOTE — Telephone Encounter (Signed)
Pt came in wanting to know what her vitamin D level result was?  Call pt @ [phone]. Thank you!

## 2016-07-02 NOTE — Telephone Encounter (Signed)
Spoke with pt and she was told a Vit D lab was not done.

## 2016-07-22 ENCOUNTER — Ambulatory Visit: Payer: Medicare Other | Admitting: Family Medicine

## 2016-09-17 DIAGNOSIS — Z85828 Personal history of other malignant neoplasm of skin: Secondary | ICD-10-CM | POA: Diagnosis not present

## 2016-09-17 DIAGNOSIS — L570 Actinic keratosis: Secondary | ICD-10-CM | POA: Diagnosis not present

## 2016-09-17 DIAGNOSIS — L821 Other seborrheic keratosis: Secondary | ICD-10-CM | POA: Diagnosis not present

## 2016-09-17 DIAGNOSIS — X32XXXA Exposure to sunlight, initial encounter: Secondary | ICD-10-CM | POA: Diagnosis not present

## 2016-09-28 DIAGNOSIS — M545 Low back pain: Secondary | ICD-10-CM | POA: Diagnosis not present

## 2016-09-30 ENCOUNTER — Telehealth: Payer: Self-pay | Admitting: Family Medicine

## 2016-09-30 MED ORDER — ESOMEPRAZOLE MAGNESIUM 20 MG PO CPDR: 20.0000 mg | DELAYED_RELEASE_CAPSULE | Freq: Every day | ORAL | 2 refills | Status: DC

## 2016-09-30 NOTE — Telephone Encounter (Signed)
Pt needs a refill on esomeprazole (NEXIUM) 20 MG capsule sent to [facility].[name]

## 2016-09-30 NOTE — Telephone Encounter (Signed)
Refill sent.

## 2016-10-02 ENCOUNTER — Ambulatory Visit (INDEPENDENT_AMBULATORY_CARE_PROVIDER_SITE_OTHER): Payer: Medicare Other

## 2016-10-02 ENCOUNTER — Encounter: Payer: Self-pay | Admitting: Family

## 2016-10-02 ENCOUNTER — Telehealth: Payer: Self-pay | Admitting: Family

## 2016-10-02 ENCOUNTER — Ambulatory Visit (INDEPENDENT_AMBULATORY_CARE_PROVIDER_SITE_OTHER): Payer: Medicare Other | Admitting: Family

## 2016-10-02 VITALS — BP 120/74 | HR 73 | Temp 97.8°F | Ht 60.0 in | Wt 108.6 lb

## 2016-10-02 DIAGNOSIS — M545 Low back pain, unspecified: Secondary | ICD-10-CM

## 2016-10-02 DIAGNOSIS — M1612 Unilateral primary osteoarthritis, left hip: Secondary | ICD-10-CM | POA: Diagnosis not present

## 2016-10-02 DIAGNOSIS — M1611 Unilateral primary osteoarthritis, right hip: Secondary | ICD-10-CM | POA: Diagnosis not present

## 2016-10-02 MED ORDER — DICLOFENAC SODIUM 1 % TD GEL
4.0000 g | Freq: Four times a day (QID) | TRANSDERMAL | 3 refills | Status: DC
Start: 2016-10-02 — End: 2017-07-16

## 2016-10-02 NOTE — Progress Notes (Signed)
Subjective:    Patient ID: Kylie May, female    DOB: 03-Jan-1922, 83 y.o.   MRN: [id number]  CC: Kylie May is a 83 y.o. female who presents today for an acute visit.    HPI: CC: had a small cough 2 weeks ( resolved) and then noticed that both sides of low back were hurting when coughed. Had been driving a lot at this time. Didn't do anything about it ' as was traveling.' Pain worse with walking and getting 'up and down.' Describes as an ache.   No numbness, tingling, falls.   Does note urinary frequency.  Tried aleve  And tyelonol with little relief. Heat helps.   Was seen at walk in clinic and told ' arthritis. '   h/o afib  Former smoker  h/o of right pelvis fracture      HISTORY:  Past Medical History:  Diagnosis Date  . A-fib ([place])   . Acid reflux disease   . Arrhythmia   . Chronic diastolic CHF (congestive heart failure) ([place])   . Compression fracture of lumbar vertebra ([employer] [place])   . Diverticulitis 01-2008   [facility] [place]  . Diverticulosis   . Esophageal stricture 2006  . Femoral bruit    Right  . Fibrocystic breast disease   . Hiatal hernia   . Hypercholesterolemia    [place] study LDL goal = < 160  . Mitral regurgitation    a. 06/2014 EF 55-60%, elevated end-diastolic pressures, dilated LA at 4.3 cm, mildly dilated RA, severe mitral regurgitation, mild aortic sclerosis without stenosis, mod-severe TR  . Mitral valve prolapse   . Osteoporosis    Dr Matthew Saras  . Pancreatitis 07-2008   Hospitalized   . Schatzki's ring   . Skin cancer    facial x 2. [name]   Past Surgical History:  Procedure Laterality Date  . COLONOSCOPY  2006  . Epidural steroids     x1  . ESOPHAGEAL DILATION  2006  . G3 P4 (twins)    . SKIN CANCER EXCISION     Family History  Problem Relation Age of Onset  . Breast cancer Maternal Aunt   . Hepatitis Daughter     C  . Lung disease Mother     lung tumor  . Heart attack Daughter 45    S/P stents  . Heart attack  Sister 57  . Diabetes Neg Hx   . Stroke Neg Hx     Allergies: Metronidazole Current Outpatient Prescriptions on File Prior to Visit  Medication Sig Dispense Refill  . carvedilol (COREG) 3.125 MG tablet TAKE 1 TABLET TWICE A DAY WITH MEALS 180 tablet 1  . ELIQUIS 2.5 MG TABS tablet TAKE 1 TABLET TWICE A DAY 180 tablet 1  . esomeprazole (NEXIUM) 20 MG capsule Take 1 capsule (20 mg total) by mouth daily at 12 noon. 90 capsule 2  . flecainide (TAMBOCOR) 50 MG tablet Take 1 tablet (50 mg total) by mouth 2 (two) times daily. 180 tablet 3  . Lactobacillus-Inulin ([place]) CAPS Take 1 capsule by mouth daily. 21 capsule 0  . magnesium oxide (MAG-OX) 400 MG tablet Take 1 tablet (400 mg total) by mouth daily. 30 tablet 0  . rosuvastatin (CRESTOR) 20 MG tablet TAKE 1 TABLET DAILY 90 tablet 0   No current facility-administered medications on file prior to visit.     Social History  Substance Use Topics  . Smoking status: Former Smoker    Quit date: 06/04/1951  .  Smokeless tobacco: Never Used     Comment: Smoked 1945-1953 , up to 2 cigarettes/day  . Alcohol use 4.2 oz/week    7 Glasses of wine per week    Review of Systems  Constitutional: Negative for chills and fever.  Respiratory: Negative for cough.   Cardiovascular: Negative for chest pain and palpitations.  Gastrointestinal: Negative for nausea and vomiting.  Genitourinary: Positive for frequency. Negative for dysuria and flank pain.  Musculoskeletal: Positive for back pain. Negative for gait problem.  Neurological: Negative for dizziness, weakness and headaches.      Objective:    BP 120/74   Pulse 73   Temp 97.8 F (36.6 C) (Oral)   Ht 5' (1.524 m)   Wt 108 lb 9.6 oz (49.3 kg)   SpO2 98%   BMI 21.21 kg/m    Physical Exam  Constitutional: She appears well-developed and well-nourished.  HENT:  Head: Normocephalic and atraumatic.  Right Ear: Hearing, tympanic membrane, external ear and ear canal  normal. No drainage, swelling or tenderness. No foreign bodies. Tympanic membrane is not erythematous and not bulging. No middle ear effusion. No decreased hearing is noted.  Left Ear: Hearing, tympanic membrane, external ear and ear canal normal. No drainage, swelling or tenderness. No foreign bodies. Tympanic membrane is not erythematous and not bulging.  No middle ear effusion. No decreased hearing is noted.  Nose: Nose normal. No rhinorrhea. Right sinus exhibits no maxillary sinus tenderness and no frontal sinus tenderness. Left sinus exhibits no maxillary sinus tenderness and no frontal sinus tenderness.  Mouth/Throat: Uvula is midline, oropharynx is clear and moist and mucous membranes are normal. No oropharyngeal exudate, posterior oropharyngeal edema, posterior oropharyngeal erythema or tonsillar abscesses.  Eyes: Conjunctivae are normal.  Cardiovascular: Normal rate, regular rhythm, normal heart sounds and normal pulses.   Pulmonary/Chest: Effort normal and breath sounds normal. She has no wheezes. She has no rhonchi. She has no rales.  Abdominal: There is no CVA tenderness.  Musculoskeletal:       Right hip: She exhibits normal range of motion, normal strength, no tenderness, no bony tenderness and no swelling.       Lumbar back: She exhibits tenderness and pain. She exhibits normal range of motion, no bony tenderness, no swelling, no edema and no spasm.       Arms: Curve of spine of noted on diagram.   Full range of motion with flexion, tension, lateral side bends. No bony tenderness. No pain, numbness, tingling elicited with single leg raise bilaterally.   Right Hip: waddling gait. Full ROM with flexion and hip rotation in flexion.  Pain of lateral hip with  (flexion-abduction-external rotation) test. No pain with deep palpation of greater trochanter.      Lymphadenopathy:       Head (right side): No submental, no submandibular, no tonsillar, no preauricular, no posterior auricular  and no occipital adenopathy present.       Head (left side): No submental, no submandibular, no tonsillar, no preauricular, no posterior auricular and no occipital adenopathy present.    She has no cervical adenopathy.  Neurological: She is alert. She has normal strength. No sensory deficit.  Reflex Scores:      Patellar reflexes are 2+ on the right side and 2+ on the left side. Sensation and strength intact bilateral lower extremities.  Skin: Skin is warm and dry.  Psychiatric: She has a normal mood and affect. Her speech is normal and behavior is normal. Thought content normal.  Vitals  reviewed.      Assessment & Plan:  1. Acute bilateral low back pain without sciatica Based on pain worsened by movement and history of scoliosis, I suspect patient's low back pain is related to degenerative disc disease, scoliosis. Pending x-rays as patient is a history of osteopenia, pelvic fracture. She's not a candidate for NSAIDs, trial of topical Voltaren gel. If no relief from conservative therapy, will refer patient to orthopedics, physical therapy.  - DG Lumbar Spine Complete - DG HIPS BILAT WITH PELVIS 3-4 VIEWS - POCT urinalysis dipstick - Urinalysis, Routine w reflex microscopic     I am having Ms. Vassel maintain her magnesium oxide, [facility] [employer], flecainide, rosuvastatin, carvedilol, ELIQUIS, and esomeprazole.   No orders of the defined types were placed in this encounter.   Return precautions given.   Risks, benefits, and alternatives of the medications and treatment plan prescribed today were discussed, and patient expressed understanding.   Education regarding symptom management and diagnosis given to patient on AVS.  Continue to follow with Coral Spikes, DO for routine health maintenance.   [name] and I agreed with plan.   Mable Paris, FNP

## 2016-10-02 NOTE — Patient Instructions (Signed)
Xrays today  Urine  Suspect arthritis as discussed  Over-the-counter medications you may try for arthritic pain include:   ThermaCare patches   Capsaicin cream   Icy hot   If conservative treatment doesn't yield results, we will consider physical therapy, consult to Sports Medicine/Orthopedics for further evaluation, and imaging.   If there is no improvement in your symptoms, or if there is any worsening of symptoms, or if you have any additional concerns, please return for re-evaluation; or, if we are closed, consider going to the Emergency Room for evaluation if symptoms urgent.

## 2016-10-02 NOTE — Addendum Note (Signed)
Addended by: Leeanne Rio on: 10/02/2016 02:40 PM   Modules accepted: Orders

## 2016-10-02 NOTE — Progress Notes (Signed)
Pre visit review using our clinic review tool, if applicable. No additional management support is needed unless otherwise documented below in the visit note. 

## 2016-10-03 ENCOUNTER — Other Ambulatory Visit: Payer: Self-pay | Admitting: Family

## 2016-10-03 ENCOUNTER — Other Ambulatory Visit (INDEPENDENT_AMBULATORY_CARE_PROVIDER_SITE_OTHER): Payer: Medicare Other

## 2016-10-03 DIAGNOSIS — R350 Frequency of micturition: Secondary | ICD-10-CM

## 2016-10-03 LAB — POCT URINALYSIS DIPSTICK
Bilirubin, UA: NEGATIVE
Blood, UA: NEGATIVE
GLUCOSE UA: NEGATIVE
KETONES UA: NEGATIVE
LEUKOCYTES UA: NEGATIVE
Nitrite, UA: NEGATIVE
Spec Grav, UA: 1.015 (ref 1.010–1.025)
UROBILINOGEN UA: 0.2 U/dL
pH, UA: 5.5 (ref 5.0–8.0)

## 2016-10-03 LAB — URINALYSIS, MICROSCOPIC ONLY
RBC / HPF: NONE SEEN (ref 0–?)
WBC UA: NONE SEEN (ref 0–?)

## 2016-10-03 NOTE — Addendum Note (Signed)
Addended by: Arby Barrette on: 10/03/2016 03:13 PM   Modules accepted: Orders

## 2016-10-03 NOTE — Telephone Encounter (Signed)
close

## 2016-10-03 NOTE — Progress Notes (Signed)
Spoken to patient she stated she will bee coming in this afternoon to provide specimen.

## 2016-10-05 LAB — URINE CULTURE

## 2016-10-06 ENCOUNTER — Other Ambulatory Visit: Payer: Self-pay | Admitting: Family

## 2016-10-06 DIAGNOSIS — G8929 Other chronic pain: Secondary | ICD-10-CM

## 2016-10-06 DIAGNOSIS — M545 Low back pain, unspecified: Secondary | ICD-10-CM

## 2016-10-07 MED ORDER — NEXIUM 20 MG PO CPDR: 20.0000 mg | DELAYED_RELEASE_CAPSULE | Freq: Every day | ORAL | 3 refills | Status: DC

## 2016-10-07 NOTE — Addendum Note (Signed)
Addended by: Carmin Muskrat on: 10/07/2016 03:58 PM   Modules accepted: Orders

## 2016-10-10 ENCOUNTER — Telehealth: Payer: Self-pay | Admitting: Family

## 2016-10-10 DIAGNOSIS — M545 Low back pain: Secondary | ICD-10-CM

## 2016-10-10 MED ORDER — PREDNISONE 10 MG PO TABS: ORAL_TABLET | ORAL | 0 refills | Status: DC

## 2016-10-10 NOTE — Telephone Encounter (Signed)
Due to acute nature of pain, may try prednisone trial  Advise pt that prednisone can make you more irritable and harder to sleep  Please start taking in the morning so she is not up all night

## 2016-10-14 DIAGNOSIS — M419 Scoliosis, unspecified: Secondary | ICD-10-CM | POA: Insufficient documentation

## 2016-10-14 DIAGNOSIS — M545 Low back pain: Secondary | ICD-10-CM | POA: Diagnosis not present

## 2016-10-18 ENCOUNTER — Ambulatory Visit: Payer: Medicare Other | Admitting: Cardiovascular Disease

## 2016-10-21 ENCOUNTER — Telehealth: Payer: Self-pay | Admitting: Family Medicine

## 2016-10-21 MED ORDER — FLECAINIDE ACETATE 50 MG PO TABS: 50.0000 mg | ORAL_TABLET | Freq: Two times a day (BID) | ORAL | 3 refills | Status: DC

## 2016-10-21 NOTE — Telephone Encounter (Signed)
This is usually refilled by Dr. Rockey Situ, please advise?

## 2016-10-21 NOTE — Telephone Encounter (Signed)
Refill sent to [facility].  

## 2016-10-21 NOTE — Telephone Encounter (Signed)
Yes needs to come from [facility].

## 2016-10-21 NOTE — Telephone Encounter (Signed)
Pt needs a refill on TamBocor. Please advise

## 2016-10-21 NOTE — Telephone Encounter (Signed)
Please advise for refill, thanks 

## 2016-10-21 NOTE — Telephone Encounter (Signed)
Okay to refill? 

## 2016-10-24 ENCOUNTER — Telehealth: Payer: Self-pay | Admitting: Cardiovascular Disease

## 2016-10-24 ENCOUNTER — Other Ambulatory Visit: Payer: Self-pay

## 2016-10-24 MED ORDER — FLECAINIDE ACETATE 50 MG PO TABS: 50.0000 mg | ORAL_TABLET | Freq: Two times a day (BID) | ORAL | 0 refills | Status: DC

## 2016-10-24 MED ORDER — FLECAINIDE ACETATE 50 MG PO TABS: 50.0000 mg | ORAL_TABLET | Freq: Two times a day (BID) | ORAL | 3 refills | Status: DC

## 2016-10-24 NOTE — Telephone Encounter (Signed)
Pt daughter called back, would like a written rx for Flecainide, not sent [facility]. Please call when ready

## 2016-10-24 NOTE — Telephone Encounter (Signed)
Only needs 30 days supply

## 2016-10-24 NOTE — Telephone Encounter (Signed)
Pt requesting written Rx.

## 2016-10-24 NOTE — Telephone Encounter (Signed)
Spoke with patients daughter per release form and she is requesting a written prescription for medication that they are waiting on mail order. Prescription placed up front for them to pick up. She was appreciative for the help and has no further questions at this time.

## 2016-11-18 ENCOUNTER — Encounter: Payer: Self-pay | Admitting: Cardiovascular Disease

## 2016-11-18 ENCOUNTER — Ambulatory Visit (INDEPENDENT_AMBULATORY_CARE_PROVIDER_SITE_OTHER): Payer: Medicare Other | Admitting: Cardiovascular Disease

## 2016-11-18 ENCOUNTER — Telehealth: Payer: Self-pay | Admitting: Cardiovascular Disease

## 2016-11-18 ENCOUNTER — Other Ambulatory Visit: Payer: Self-pay

## 2016-11-18 VITALS — BP 148/70 | HR 66 | Ht 60.0 in | Wt 106.5 lb

## 2016-11-18 DIAGNOSIS — I340 Nonrheumatic mitral (valve) insufficiency: Secondary | ICD-10-CM

## 2016-11-18 DIAGNOSIS — E7800 Pure hypercholesterolemia, unspecified: Secondary | ICD-10-CM

## 2016-11-18 DIAGNOSIS — I480 Paroxysmal atrial fibrillation: Secondary | ICD-10-CM | POA: Diagnosis not present

## 2016-11-18 DIAGNOSIS — I5032 Chronic diastolic (congestive) heart failure: Secondary | ICD-10-CM

## 2016-11-18 MED ORDER — APIXABAN 2.5 MG PO TABS: 2.5000 mg | ORAL_TABLET | Freq: Two times a day (BID) | ORAL | 1 refills | Status: DC

## 2016-11-18 MED ORDER — CARVEDILOL 3.125 MG PO TABS: 3.1250 mg | ORAL_TABLET | Freq: Two times a day (BID) | ORAL | 1 refills | Status: DC

## 2016-11-18 NOTE — Telephone Encounter (Signed)
°*  STAT* If patient is at the pharmacy, call can be transferred to refill team.   1. Which medications need to be refilled? (please list name of each medication and dose if known)  Eliquis  Carvedilol   2. Which pharmacy/location (including street and city if local pharmacy) is medication [facility]   3. Do they need a 30 day or 90 day supply? 90 days

## 2016-11-18 NOTE — Patient Instructions (Signed)

## 2016-11-18 NOTE — Telephone Encounter (Signed)
Requested Prescriptions   Signed Prescriptions Disp Refills  . apixaban (ELIQUIS) 2.5 MG TABS tablet 180 tablet 1    Sig: Take 1 tablet (2.5 mg total) by mouth 2 (two) times daily.    Authorizing Provider: Minna Merritts    Ordering User: Janan Ridge carvedilol (COREG) 3.125 MG tablet 180 tablet 1    Sig: Take 1 tablet (3.125 mg total) by mouth 2 (two) times daily with a meal.    Authorizing Provider: Minna Merritts    Ordering User: Janan Ridge

## 2016-11-18 NOTE — Progress Notes (Signed)
Cardiology Office Note  Date:  11/18/2016   ID:  CHERLY ERNO, DOB [birth year], MRN [id number]  PCP:  Coral Spikes, DO   Chief Complaint  Patient presents with  . other    6 month follow up. Patient denies chest pain and SOB. Meds reviewed verbally with patient.     HPI:  83 year old female with history of  paroxysmal asymptomatic  a-fib,  HTN, hiatal hernia,  osteopenia,  degenerative joint disease, scoliosis  who was admitted to [facility] 1/7-1/10/16 for new onset a-fib and mild diastolic CHF,  who presents today for follow-up of her atrial fibrillation.   climb in her TSH on amiodarone, peak of 19, with repeat down to 16  Amiodarone was held and she was started on flecainide   Previous  fall  missed a step and fell onto her right side, suffered pelvic fracture in 2 areas per the patient.  Very hard of hearing Long history of falls Does not do any regular exercise otherwise has no complaints Denies any tachycardia concerning for arrhythmia No chest tightness, no shortness of breath   main complaint today is right hip right flank pain She was referred to [facility] orthopedics but appointment made for end of July   she reports that she could not wait and she went to  [facility]   she continues to have pain and wonders if she needs to go back to [facility] She is confused as to when the appointment was and what the name of the group was . We tried to explain the name of the group and the doctor she saw as it is in the computer but she was still confused   eventually we said to call Dr. Lacinda Axon   EKG on today's visit shows normal sinus rhythm with rate 66 bpm, nonspecific ST and T wave abnormality  Other past medical history Echocardiogram 06/09/2014 shows normal LV function, severely dilated left atrium, severe MR, moderate to severe TR  Beginning after Christmas 2015 she developed worsening fatigue as well as lower extremity edema.  She was seen at urgent care and  noted to have an elevated heart rate and was brought to [facility] for further evaluation.  She was found to be in a-fib with RVR. She was treated initially with IV Cardizem, however her BP became soft and was changed to amiodarone gtt. She did convert to NSR and was continued on po amiodarone at 400 mg bid at discharge.  She was placed on apixaban 2.5 mg bid (age 7 and weight 54.3 kg).    PMH:   has a past medical history of A-fib ([place]); Acid reflux disease; Arrhythmia; Chronic diastolic CHF (congestive heart failure) ([place]); Compression fracture of lumbar vertebra ([place]); Diverticulitis (01-2008); Diverticulosis; Esophageal stricture (2006); Femoral bruit; Fibrocystic breast disease; Hiatal hernia; Hypercholesterolemia; Mitral regurgitation; Mitral valve prolapse; Osteoporosis; Pancreatitis (07-2008); Schatzki's ring; and Skin cancer.  PSH:    Past Surgical History:  Procedure Laterality Date  . COLONOSCOPY  2006  . Epidural steroids     x1  . ESOPHAGEAL DILATION  2006  . G3 P4 (twins)    . SKIN CANCER EXCISION      Current Outpatient Prescriptions  Medication Sig Dispense Refill  . carvedilol (COREG) 3.125 MG tablet TAKE 1 TABLET TWICE A DAY WITH MEALS 180 tablet 1  . diclofenac sodium (VOLTAREN) 1 % GEL Apply 4 g topically 4 (four) times daily. 1 Tube 3  . ELIQUIS 2.5 MG TABS tablet TAKE 1  TABLET TWICE A DAY 180 tablet 1  . flecainide (TAMBOCOR) 50 MG tablet Take 1 tablet (50 mg total) by mouth 2 (two) times daily. 60 tablet 0  . Lactobacillus-Inulin ([place]) CAPS Take 1 capsule by mouth daily. 21 capsule 0  . magnesium oxide (MAG-OX) 400 MG tablet Take 1 tablet (400 mg total) by mouth daily. 30 tablet 0  . NEXIUM 20 MG capsule Take 1 capsule (20 mg total) by mouth daily at 12 noon. 90 capsule 3  . rosuvastatin (CRESTOR) 20 MG tablet TAKE 1 TABLET DAILY 90 tablet 0   No current facility-administered medications for this visit.      Allergies:   Metronidazole    Social History:  The patient  reports that she quit smoking about 65 years ago. She has never used smokeless tobacco. She reports that she drinks about 4.2 oz of alcohol per week . She reports that she does not use drugs.   Family History:   family history includes Breast cancer in her maternal aunt; Heart attack (age of onset: 11) in her daughter; Heart attack (age of onset: 30) in her sister; Hepatitis in her daughter; Lung disease in her mother.    Review of Systems: Review of Systems  Constitutional: Negative.   Respiratory: Negative.   Cardiovascular: Negative.   Gastrointestinal: Negative.   Musculoskeletal: Positive for back pain and joint pain.  Neurological: Negative.   Psychiatric/Behavioral: Negative.   All other systems reviewed and are negative.    PHYSICAL EXAM: VS:  BP (!) 148/70 (BP Location: Left Arm, Patient Position: Sitting, Cuff Size: Normal)   Pulse 66   Ht 5' (1.524 m)   Wt 106 lb 8 oz (48.3 kg)   BMI 20.80 kg/m  , BMI Body mass index is 20.8 kg/m. GEN: Well nourished, well developed, in no acute distress , hard of hearing HEENT: normal  Neck: no JVD, carotid bruits, or masses Cardiac: RRR; no murmurs, rubs, or gallops,no edema  Respiratory:  clear to auscultation bilaterally, normal work of breathing GI: soft, nontender, nondistended, + BS MS: no deformity or atrophy , left hand middle finger swollen, tender to palpation Skin: warm and dry, no rash Neuro:  Strength and sensation are intact Psych: euthymic mood, full affect    Recent Labs: 01/18/2016: ALT 13; BUN 12; Creatinine, Ser 1.04; Hemoglobin 13.0; Platelets 277.0; Potassium 4.9; Sodium 134    Lipid Panel Lab Results  Component Value Date   CHOL 184 01/18/2016   HDL 68.30 01/18/2016   LDLCALC 96 01/18/2016   TRIG 97.0 01/18/2016      Wt Readings from Last 3 Encounters:  11/18/16 106 lb 8 oz (48.3 kg)  10/02/16 108 lb 9.6 oz (49.3 kg)  06/13/16 107 lb (48.5 kg)        ASSESSMENT AND PLAN:  Paroxysmal atrial fibrillation (HCC) - Plan: EKG 12-Lead Maintaining normal sinus rhythm, tolerating anticoagulation We'll continue to monitor her falls Continue antiarrhythmic and anticoagulation  Chronic diastolic CHF (congestive heart failure) ([place]) - Plan: EKG 12-Lead Appears euvolemic on today's visit, no changes to her medications  Back pain/hip pain Seen by [facility] on her own, self-referred 1 month ago   also has consult placed for [facility] she is confused as to who to see  also confused as to the name of the group she has already seen  ([facility])  Pure hypercholesterolemia stay on her Crestor  Encounter for anticoagulation discussion and counseling She feels comfortable staying on her anticoagulation She  does have history of falls    Total encounter time more than 25 minutes  Greater than 50% was spent in counseling and coordination of care with the patient  Disposition:   F/U  6 months   Orders Placed This Encounter  Procedures  . EKG 12-Lead     Signed, Esmond Plants, M.D., Ph.D. 11/18/2016  [place], [place]

## 2016-11-25 ENCOUNTER — Telehealth: Payer: Self-pay | Admitting: Family Medicine

## 2016-11-25 NOTE — Telephone Encounter (Signed)
Patient came into office today to enquire as to should she keep the referral to Dr. Sharlet Salina made by PCP for her back pain , since she has seen [facility] and her pain is a little better but not relieved. Reviewed note from [facility] patient was advised her pain was probably muscle related and was given Robaxin 500 mg, patient stated this has made her back better, but still having pain in right hip and leg pain  Advised patient that she should keep the appointment and she advised that she would keep the appointment with Dr. Sharlet Salina, also called patient DPR ( daughter ) and advised her of patients up coming appointment. Patient wanted to schedule 6 month FU with PCP while in office appointment scheduled 12/26/16 @  3 :30.[name] FYI

## 2016-11-27 DIAGNOSIS — M545 Low back pain: Secondary | ICD-10-CM | POA: Diagnosis not present

## 2016-12-16 DIAGNOSIS — M545 Low back pain: Secondary | ICD-10-CM | POA: Diagnosis not present

## 2016-12-16 DIAGNOSIS — M25551 Pain in right hip: Secondary | ICD-10-CM | POA: Diagnosis not present

## 2016-12-18 ENCOUNTER — Other Ambulatory Visit: Payer: Self-pay | Admitting: Physical Medicine and Rehabilitation

## 2016-12-18 DIAGNOSIS — M5416 Radiculopathy, lumbar region: Secondary | ICD-10-CM

## 2016-12-18 DIAGNOSIS — M48062 Spinal stenosis, lumbar region with neurogenic claudication: Secondary | ICD-10-CM | POA: Diagnosis not present

## 2016-12-18 DIAGNOSIS — M5136 Other intervertebral disc degeneration, lumbar region: Secondary | ICD-10-CM | POA: Diagnosis not present

## 2016-12-24 DIAGNOSIS — L570 Actinic keratosis: Secondary | ICD-10-CM | POA: Diagnosis not present

## 2016-12-24 DIAGNOSIS — X32XXXA Exposure to sunlight, initial encounter: Secondary | ICD-10-CM | POA: Diagnosis not present

## 2016-12-25 ENCOUNTER — Ambulatory Visit
Admission: RE | Admit: 2016-12-25 | Discharge: 2016-12-25 | Disposition: A | Discharge status: Home / Self Care | Payer: Medicare Other | Source: Ambulatory Visit | Attending: Physical Medicine and Rehabilitation | Admitting: Physical Medicine and Rehabilitation

## 2016-12-25 DIAGNOSIS — M5116 Intervertebral disc disorders with radiculopathy, lumbar region: Secondary | ICD-10-CM | POA: Insufficient documentation

## 2016-12-25 DIAGNOSIS — M5416 Radiculopathy, lumbar region: Secondary | ICD-10-CM | POA: Diagnosis present

## 2016-12-25 DIAGNOSIS — M5136 Other intervertebral disc degeneration, lumbar region: Secondary | ICD-10-CM | POA: Diagnosis not present

## 2016-12-25 DIAGNOSIS — M4856XA Collapsed vertebra, not elsewhere classified, lumbar region, initial encounter for fracture: Secondary | ICD-10-CM | POA: Insufficient documentation

## 2016-12-25 DIAGNOSIS — M4807 Spinal stenosis, lumbosacral region: Secondary | ICD-10-CM | POA: Insufficient documentation

## 2016-12-26 ENCOUNTER — Ambulatory Visit: Payer: Medicare Other | Admitting: Family Medicine

## 2017-01-10 DIAGNOSIS — M5136 Other intervertebral disc degeneration, lumbar region: Secondary | ICD-10-CM | POA: Diagnosis not present

## 2017-01-10 DIAGNOSIS — M48062 Spinal stenosis, lumbar region with neurogenic claudication: Secondary | ICD-10-CM | POA: Diagnosis not present

## 2017-01-10 DIAGNOSIS — M5416 Radiculopathy, lumbar region: Secondary | ICD-10-CM | POA: Diagnosis not present

## 2017-01-14 ENCOUNTER — Ambulatory Visit (INDEPENDENT_AMBULATORY_CARE_PROVIDER_SITE_OTHER): Payer: Medicare Other | Admitting: Family Medicine

## 2017-01-14 VITALS — BP 130/78 | HR 71 | Temp 98.5°F | Resp 16 | Wt 104.5 lb

## 2017-01-14 DIAGNOSIS — E7800 Pure hypercholesterolemia, unspecified: Secondary | ICD-10-CM

## 2017-01-14 DIAGNOSIS — M8580 Other specified disorders of bone density and structure, unspecified site: Secondary | ICD-10-CM | POA: Diagnosis not present

## 2017-01-14 DIAGNOSIS — I480 Paroxysmal atrial fibrillation: Secondary | ICD-10-CM | POA: Diagnosis not present

## 2017-01-14 DIAGNOSIS — G8929 Other chronic pain: Secondary | ICD-10-CM | POA: Diagnosis not present

## 2017-01-14 DIAGNOSIS — M549 Dorsalgia, unspecified: Secondary | ICD-10-CM

## 2017-01-14 DIAGNOSIS — M5440 Lumbago with sciatica, unspecified side: Secondary | ICD-10-CM | POA: Diagnosis not present

## 2017-01-14 DIAGNOSIS — E559 Vitamin D deficiency, unspecified: Secondary | ICD-10-CM | POA: Diagnosis not present

## 2017-01-14 LAB — LIPID PANEL
CHOLESTEROL: 173 mg/dL (ref 0–200)
HDL: 67.3 mg/dL (ref >39.00)
LDL Cholesterol: 81 mg/dL (ref 0–99)
NonHDL: 105.66
TRIGLYCERIDES: 121 mg/dL (ref 0.0–149.0)
Total CHOL/HDL Ratio: 3
VLDL: 24.2 mg/dL (ref 0.0–40.0)

## 2017-01-14 LAB — CBC
HCT: 40.2 % (ref 36.0–46.0)
Hemoglobin: 13.2 g/dL (ref 12.0–15.0)
MCHC: 32.8 g/dL (ref 30.0–36.0)
MCV: 91.6 fl (ref 78.0–100.0)
PLATELETS: 297 10*3/uL (ref 150.0–400.0)
RBC: 4.39 Mil/uL (ref 3.87–5.11)
RDW: 13.6 % (ref 11.5–15.5)
WBC: 11.2 10*3/uL — ABNORMAL HIGH (ref 4.0–10.5)

## 2017-01-14 LAB — COMPREHENSIVE METABOLIC PANEL
ALBUMIN: 3.7 g/dL (ref 3.5–5.2)
ALK PHOS: 36 U/L — AB (ref 39–117)
ALT: 12 U/L (ref 0–35)
AST: 13 U/L (ref 0–37)
BUN: 17 mg/dL (ref 6–23)
CALCIUM: 9.2 mg/dL (ref 8.4–10.5)
CO2: 32 mEq/L (ref 19–32)
Chloride: 95 mEq/L — ABNORMAL LOW (ref 96–112)
Creatinine, Ser: 0.86 mg/dL (ref 0.40–1.20)
GFR: 65.42 mL/min (ref >60.00)
Glucose, Bld: 83 mg/dL (ref 70–99)
Potassium: 4.6 mEq/L (ref 3.5–5.1)
Sodium: 130 mEq/L — ABNORMAL LOW (ref 135–145)
Total Bilirubin: 0.6 mg/dL (ref 0.2–1.2)
Total Protein: 5.8 g/dL — ABNORMAL LOW (ref 6.0–8.3)

## 2017-01-14 LAB — VITAMIN D 25 HYDROXY (VIT D DEFICIENCY, FRACTURES): VITD: 30.15 ng/mL (ref 30.00–100.00)

## 2017-01-14 NOTE — Assessment & Plan Note (Signed)
Improving but not resolved. Continue to see Chasnis.

## 2017-01-14 NOTE — Assessment & Plan Note (Signed)
Lipid panel today.  Continue Crestor. 

## 2017-01-14 NOTE — Progress Notes (Signed)
Subjective:  Patient ID: Kylie May, female    DOB: Apr 10, 1922  Age: 83 y.o. MRN: [id number]  CC: Follow up  HPI:  83 year old female with atrial fibrillation, hearing loss, GERD, hyperlipidemia, chronic back pain presents for follow-up.  Atrial fibrillation  Stable Eliquis, carvedilol and flecainide.  Hyperlipidemia  Has been stable on Crestor.  Chronic back pain  Follow with Dr. Sharlet Salina.  Has had an epidural steroid injection and has had improvement. She is still bothered by this however. This is her primary complaint today.  Social Hx   Social History   Social History  . Marital status: Widowed    Spouse name: N/A  . Number of children: 3  . Years of education: N/A   Occupational History  . 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 Smoking status: Former Smoker    Quit date: 06/04/1951  . Smokeless tobacco: Never Used     Comment: Smoked 1945-1953 , up to 2 cigarettes/day  . Alcohol use 4.2 oz/week    7 Glasses of wine per week  . Drug use: No  . Sexual activity: Not on file   Other Topics Concern  . Not on file   Social History Narrative   Has living will    Would want daughter Santiago Glad to be health care POA   Not sure about DNR--definitely doesn't want prolonged life support   Not sure about tube feeds either (but probably not)          Review of Systems  HENT: Positive for hearing loss.   Musculoskeletal: Positive for back pain.   Objective:  BP 130/78 (BP Location: Left Arm, Patient Position: Sitting, Cuff Size: Normal)   Pulse 71   Temp 98.5 F (36.9 C) (Oral)   Resp 16   Wt 104 lb 8 oz (47.4 kg)   SpO2 95%   BMI 20.41 kg/m   BP/Weight 01/14/2017 12/07/2374 07/11/3149  Systolic BP 761 607 371  Diastolic BP 78 70 74  Wt. (Lbs) 104.5 106.5 108.6  BMI 20.41 20.8 21.21    Physical Exam  Constitutional: She appears well-developed. No distress.  HENT:  Head: Normocephalic  and atraumatic.  Very hard of hearing.   Cardiovascular: Normal rate and regular rhythm.   Pulmonary/Chest: Effort normal. She has no wheezes. She has no rales.  Neurological: She is alert.  Psychiatric: She has a normal mood and affect.  Vitals reviewed.   Lab Results  Component Value Date   WBC 7.1 01/18/2016   HGB 13.0 01/18/2016   HCT 39.5 01/18/2016   PLT 277.0 01/18/2016   GLUCOSE 96 01/18/2016   CHOL 184 01/18/2016   TRIG 97.0 01/18/2016   HDL 68.30 01/18/2016   LDLDIRECT 121.4 06/17/2011   LDLCALC 96 01/18/2016   ALT 13 01/18/2016   AST 21 01/18/2016   NA 134 (L) 01/18/2016   K 4.9 01/18/2016   CL 101 01/18/2016   CREATININE 1.04 01/18/2016   BUN 12 01/18/2016   CO2 29 01/18/2016   TSH 16.890 (H) 10/19/2014   INR 1.2 09/16/2014    Assessment & Plan:   Problem List Items Addressed This Visit    Hyperlipidemia    Lipid panel today. Continue Crestor.       Relevant Orders   Lipid panel   A-fib ([place]) - Primary    Stable on Eliquis, carvedilol, flecainide. Continue.      Relevant Orders   CBC  Comprehensive metabolic panel   Chronic back pain    Improving but not resolved. Continue to see Chasnis.       Other Visit Diagnoses    Osteopenia, unspecified location       Relevant Orders   Comprehensive metabolic panel   Vitamin D deficiency       Relevant Orders   Vitamin D (25 hydroxy)     Follow-up: PRN  [place]

## 2017-01-14 NOTE — Assessment & Plan Note (Signed)
Stable on Eliquis, carvedilol, flecainide. Continue.

## 2017-01-14 NOTE — Patient Instructions (Signed)
You're doing well.  Follow up with Dr. Sharlet Salina regarding your back.  We will call with your lab results.  Take care  Dr. Lacinda Axon

## 2017-01-15 DIAGNOSIS — H02005 Unspecified entropion of left lower eyelid: Secondary | ICD-10-CM | POA: Diagnosis not present

## 2017-02-06 DIAGNOSIS — H02035 Senile entropion of left lower eyelid: Secondary | ICD-10-CM | POA: Diagnosis not present

## 2017-02-06 DIAGNOSIS — H02045 Spastic entropion of left lower eyelid: Secondary | ICD-10-CM | POA: Diagnosis not present

## 2017-02-24 ENCOUNTER — Telehealth: Payer: Self-pay | Admitting: Cardiovascular Disease

## 2017-02-24 ENCOUNTER — Other Ambulatory Visit: Payer: Self-pay | Admitting: *Deleted

## 2017-02-24 MED ORDER — FLECAINIDE ACETATE 50 MG PO TABS: 50.0000 mg | ORAL_TABLET | Freq: Two times a day (BID) | ORAL | 1 refills | Status: DC

## 2017-02-24 NOTE — Telephone Encounter (Signed)
°*  STAT* If patient is at the pharmacy, call can be transferred to refill team.   1. Which medications need to be refilled? (please list name of each medication and dose if known) Flecainide  2. Which pharmacy/location (including street and city if local pharmacy) is medication to be sent to? [facility] [address] they need a 30 day or 90 day supply? PT has enough til Wednesday, would like short refill sent to [facility], regular refill send to mail order.

## 2017-02-24 NOTE — Telephone Encounter (Signed)
Requested Prescriptions   Signed Prescriptions Disp Refills  . flecainide (TAMBOCOR) 50 MG tablet 180 tablet 1    Sig: Take 1 tablet (50 mg total) by mouth 2 (two) times daily.    Authorizing Provider: Minna Merritts    Ordering User: Britt Bottom

## 2017-02-24 NOTE — Telephone Encounter (Signed)
Requested Prescriptions   Signed Prescriptions Disp Refills  . flecainide (TAMBOCOR) 50 MG tablet 28 tablet 1    Sig: Take 1 tablet (50 mg total) by mouth 2 (two) times daily.    Authorizing Provider: Minna Merritts    Ordering User: Britt Bottom   Requested Prescriptions   Signed Prescriptions Disp Refills  . flecainide (TAMBOCOR) 50 MG tablet 180 tablet 1    Sig: Take 1 tablet (50 mg total) by mouth 2 (two) times daily.    Authorizing Provider: Minna Merritts    Ordering User: Britt Bottom

## 2017-03-04 DIAGNOSIS — M5416 Radiculopathy, lumbar region: Secondary | ICD-10-CM | POA: Diagnosis not present

## 2017-03-04 DIAGNOSIS — M5136 Other intervertebral disc degeneration, lumbar region: Secondary | ICD-10-CM | POA: Diagnosis not present

## 2017-03-04 DIAGNOSIS — M48062 Spinal stenosis, lumbar region with neurogenic claudication: Secondary | ICD-10-CM | POA: Diagnosis not present

## 2017-03-26 DIAGNOSIS — M5416 Radiculopathy, lumbar region: Secondary | ICD-10-CM | POA: Diagnosis not present

## 2017-03-26 DIAGNOSIS — M5136 Other intervertebral disc degeneration, lumbar region: Secondary | ICD-10-CM | POA: Diagnosis not present

## 2017-03-26 DIAGNOSIS — M48062 Spinal stenosis, lumbar region with neurogenic claudication: Secondary | ICD-10-CM | POA: Diagnosis not present

## 2017-04-10 DIAGNOSIS — H02045 Spastic entropion of left lower eyelid: Secondary | ICD-10-CM | POA: Diagnosis not present

## 2017-04-10 DIAGNOSIS — H02035 Senile entropion of left lower eyelid: Secondary | ICD-10-CM | POA: Diagnosis not present

## 2017-05-08 DIAGNOSIS — J209 Acute bronchitis, unspecified: Secondary | ICD-10-CM | POA: Diagnosis not present

## 2017-05-17 ENCOUNTER — Other Ambulatory Visit: Payer: Self-pay | Admitting: Cardiovascular Disease

## 2017-05-19 NOTE — Telephone Encounter (Signed)
Refill Request.  

## 2017-05-20 DIAGNOSIS — R208 Other disturbances of skin sensation: Secondary | ICD-10-CM | POA: Diagnosis not present

## 2017-05-20 DIAGNOSIS — C44329 Squamous cell carcinoma of skin of other parts of face: Secondary | ICD-10-CM | POA: Diagnosis not present

## 2017-05-20 DIAGNOSIS — D485 Neoplasm of uncertain behavior of skin: Secondary | ICD-10-CM | POA: Diagnosis not present

## 2017-05-30 ENCOUNTER — Ambulatory Visit (INDEPENDENT_AMBULATORY_CARE_PROVIDER_SITE_OTHER): Payer: Medicare Other

## 2017-05-30 VITALS — BP 130/70 | HR 71 | Temp 98.3°F | Resp 14 | Ht 59.0 in | Wt 102.1 lb

## 2017-05-30 DIAGNOSIS — Z0000 Encounter for general adult medical examination without abnormal findings: Secondary | ICD-10-CM | POA: Diagnosis not present

## 2017-05-30 DIAGNOSIS — Z1331 Encounter for screening for depression: Secondary | ICD-10-CM | POA: Diagnosis not present

## 2017-05-30 NOTE — Patient Instructions (Addendum)
  Ms. Rumberger , Thank you for taking time to come for your Medicare Wellness Visit. I appreciate your ongoing commitment to your health goals. Please review the following plan we discussed and let me know if I can assist you in the future.   Follow up with Dr. Caryl Bis as needed.    Bring a copy of your [place] and/or Living Will to be scanned into chart.  Bring your immunization record from your pharmacy so we can update your Shingles and Influenza vaccine.   Have a great day and Happy New Year.  These are the goals we discussed: Goals    . DIET - INCREASE WATER INTAKE     Stay hydrated and drink plenty of water Finish all water in the cup when taking medications       This is a list of the screening recommended for you and due dates:  Health Maintenance  Topic Date Due  . Flu Shot  08/31/2017*  . Tetanus Vaccine  04/18/2026  . DEXA scan (bone density measurement)  Completed  . Pneumonia vaccines  Completed  *Topic was postponed. The date shown is not the original due date.

## 2017-05-30 NOTE — Progress Notes (Signed)
Subjective:   Kylie May is a 83 y.o. female who presents for Medicare Annual (Subsequent) preventive examination.  Review of Systems:  No ROS.  Medicare Wellness Visit. Additional risk factors are reflected in the social history.  Cardiac Risk Factors include: advanced age (>77men, >69 women)     Objective:     Vitals: BP 130/70 (BP Location: Left Arm, Patient Position: Sitting, Cuff Size: Normal)   Pulse 71   Temp 98.3 F (36.8 C) (Oral)   Resp 14   Ht 4\' 11"  (1.499 m)   Wt 102 lb 1.9 oz (46.3 kg)   SpO2 95%   BMI 20.63 kg/m   Body mass index is 20.63 kg/m.  Advanced Directives 05/30/2017 05/30/2016  Does Patient Have a Medical Advance Directive? Yes Yes  Type of Paramedic of [place];Living will [place];Living will  Does patient want to make changes to medical advance directive? No - Patient declined No - Patient declined  Copy of [place] in Chart? No - copy requested No - copy requested    Tobacco Social History   Tobacco Use  Smoking Status Former Smoker  . Last attempt to quit: 06/04/1951  . Years since quitting: 66.0  Smokeless Tobacco Never Used  Tobacco Comment   Smoked [phone] , up to 2 cigarettes/day     Counseling given: Not Answered Comment: Smoked [phone] , up to 2 cigarettes/day   Clinical Intake:  Pre-visit preparation completed: Yes  Pain : No/denies pain     Nutritional Status: BMI of 19-24  Normal Diabetes: No  How often do you need to have someone help you when you read instructions, pamphlets, or other written materials from your doctor or pharmacy?: 1 - Never  Interpreter Needed?: No     Past Medical History:  Diagnosis Date  . A-fib ([place])   . Acid reflux disease   . Arrhythmia   . Chronic diastolic CHF (congestive heart failure) ([place])   . Compression fracture of lumbar vertebra ([place])   . Diverticulitis 01-2008   GI [place]  . Diverticulosis    . Esophageal stricture 2006  . Femoral bruit    Right  . Fibrocystic breast disease   . Hiatal hernia   . Hypercholesterolemia    [place] study LDL goal = < 160  . Mitral regurgitation    a. 06/2014 EF 55-60%, elevated end-diastolic pressures, dilated LA at 4.3 cm, mildly dilated RA, severe mitral regurgitation, mild aortic sclerosis without stenosis, mod-severe TR  . Mitral valve prolapse   . Osteoporosis    Dr Matthew Saras  . Pancreatitis 07-2008   Hospitalized   . Schatzki's ring   . Skin cancer    facial x 2. [name]   Past Surgical History:  Procedure Laterality Date  . COLONOSCOPY  2006  . Epidural steroids     x1  . ESOPHAGEAL DILATION  2006  . G3 P4 (twins)    . SKIN CANCER EXCISION     Family History  Problem Relation Age of Onset  . Breast cancer Maternal Aunt   . Hepatitis Daughter        C  . Lung disease Mother        lung tumor  . Heart attack Daughter 96       S/P stents  . Heart attack Sister 19  . Diabetes Neg Hx   . Stroke Neg Hx    Social History   Socioeconomic History  .  Marital status: Widowed    Spouse name: None  . Number of children: 3  . Years of education: None  . Highest education level: None  Social Needs  . Financial resource strain: None  . Food insecurity - worry: None  . Food insecurity - inability: None  . Transportation needs - medical: None  . Transportation needs - non-medical: None  Occupational History  . 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 Smoking status: Former Smoker    Last attempt to quit: 06/04/1951    Years since quitting: 66.0  . Smokeless tobacco: Never Used  . Tobacco comment: Smoked 1945-1953 , up to 2 cigarettes/day  Substance and Sexual Activity  . Alcohol use: Yes    Alcohol/week: 4.2 oz    Types: 7 Glasses of wine per week  . Drug use: No  . Sexual activity: Not Currently  Other Topics Concern  . None  Social History  Narrative   Has living will    Would want daughter Santiago Glad to be health care POA   Not sure about DNR--definitely doesn't want prolonged life support   Not sure about tube feeds either (but probably not)          Outpatient Encounter Medications as of 05/30/2017  Medication Sig  . carvedilol (COREG) 3.125 MG tablet TAKE 1 TABLET TWICE A DAY WITH MEALS  . diclofenac sodium (VOLTAREN) 1 % GEL Apply 4 g topically 4 (four) times daily.  [name] ELIQUIS 2.5 MG TABS tablet TAKE 1 TABLET TWICE A DAY  . flecainide (TAMBOCOR) 50 MG tablet Take 1 tablet (50 mg total) by mouth 2 (two) times daily.  . Lactobacillus-Inulin ([place]) CAPS Take 1 capsule by mouth daily.  . magnesium oxide (MAG-OX) 400 MG tablet Take 1 tablet (400 mg total) by mouth daily.  [name] NEXIUM 20 MG capsule Take 1 capsule (20 mg total) by mouth daily at 12 noon.  . rosuvastatin (CRESTOR) 20 MG tablet TAKE 1 TABLET DAILY   No facility-administered encounter medications on file as of 05/30/2017.     Activities of Daily Living In your present state of health, do you have any difficulty performing the following activities: 05/30/2017 05/30/2016  Hearing? Y Y  Comment Hearing aids, bilateral -  Vision? N N  Difficulty concentrating or making decisions? N N  Comment Memory delay with names -  Walking or climbing stairs? N N  Dressing or bathing? N N  Doing errands, shopping? N N  Preparing Food and eating ? N N  Using the Toilet? N N  In the past six months, have you accidently leaked urine? N N  Do you have problems with loss of bowel control? N N  Managing your Medications? N N  Managing your Finances? N N  Housekeeping or managing your Housekeeping? Y N  Comment Housekeeping assists 1 time per week -  Some recent data might be hidden    Patient Care Team: [name], MD as PCP - General (Family Medicine) Minna Merritts, MD as Consulting Physician (Cardiology)    Assessment:   This is a  routine wellness examination for Lewis. The goal of the wellness visit is to assist the patient how to close the gaps in care and create a preventative care plan for the patient.   The roster of all physicians providing medical care to patient is listed in the Snapshot section of the chart.  Taking calcium VIT  D as appropriate/Osteoporosis risk reviewed.    Safety issues reviewed; Smoke and carbon monoxide detectors in the home. No firearms in the home.  Wears seatbelt when driving or riding with others. Patient does wear sunscreen or protective clothing when in direct sunlight. No violence in the home.  Depression- PHQ 2 &9 complete.  No signs/symptoms or verbal communication regarding little pleasure in doing things, feeling down, depressed or hopeless. No changes in sleeping, energy, eating, concentrating.  No thoughts of self harm or harm towards others.  Time spent on this topic is 8 minutes.   Patient is alert, normal appearance, oriented to person/place/and time. Correctly identified the president of the [place], recall of 3/3 words, and performing simple calculations. Displays appropriate judgement and can read correct time from watch face.   No new identified risk were noted.  No failures at ADL's or IADL's.    BMI- discussed the importance of a healthy diet, water intake and the benefits of aerobic exercise. Educational material provided.   24 hour diet recall: Regular diet  Daily fluid intake: 1 cups of caffeine, 2 cups of water  Dental- every 6 months.  Eye- Visual acuity not assessed per patient preference since they have regular follow up with the ophthalmologist.  Wears corrective lenses.  Sleep patterns- Sleeps 6 hours at night.  Wakes feeling rested.   Patient Concerns: None at this time. Follow up with PCP as needed.  Exercise Activities and Dietary recommendations Current Exercise Habits: The patient does not participate in regular exercise at present, Intensity:  Mild  Goals    . DIET - INCREASE WATER INTAKE     Stay hydrated and drink plenty of water Finish all water in the cup when taking medications       Fall Risk Fall Risk  05/30/2017 10/02/2016 05/30/2016 02/17/2014 11/10/2012  Falls in the past year? No No Yes Yes No  Number falls in past yr: - - 2 or more 1 -  Injury with Fall? - - Yes Yes -  Risk Factor Category  - - High Fall Risk High Fall Risk -  Risk for fall due to : - - History of fall(s) History of fall(s) -  Follow up - - Education provided;Falls prevention discussed - -   Depression Screen PHQ 2/9 Scores 05/30/2017 10/02/2016 05/30/2016 02/17/2014  PHQ - 2 Score 0 0 0 0     Cognitive Function MMSE - Mini Mental State Exam 05/30/2016  Orientation to time 5  Orientation to Place 5  Registration 3  Attention/ Calculation 5  Recall 3  Language- name 2 objects 2  Language- repeat 1  Language- follow 3 step command 3  Language- read & follow direction 1  Write a sentence 1  Copy design 1  Total score 30     6CIT Screen 05/30/2017  What Year? 0 points  What month? 0 points  What time? 0 points  Count back from 20 0 points  Months in reverse 0 points  Repeat phrase 0 points  Total Score 0    Immunization History  Administered Date(s) Administered  . H1N1 08/08/2008  . Influenza,inj,Quad PF,6+ Mos 02/17/2014  . Influenza-Unspecified 03/03/2016  . Pneumococcal Conjugate-13 02/17/2014  . Pneumococcal Polysaccharide-23 11/10/2012  . Td 01/09/2010  . Tdap 04/18/2016    Screening Tests Health Maintenance  Topic Date Due  . INFLUENZA VACCINE  08/31/2017 (Originally 01/01/2017)  . TETANUS/TDAP  04/18/2026  . DEXA SCAN  Completed  . PNA vac Low Risk Adult  Completed      Plan:    End of life planning; Advance aging; Advanced directives discussed. Copy of current HCPOA/Living Will requested.    I have personally reviewed and noted the following in the patient's chart:   . Medical and social history . Use of  alcohol, tobacco or illicit drugs  . Current medications and supplements . Functional ability and status . Nutritional status . Physical activity . Advanced directives . List of other physicians . Hospitalizations, surgeries, and ER visits in previous 12 months . Vitals . Screenings to include cognitive, depression, and falls . Referrals and appointments  In addition, I have reviewed and discussed with patient certain preventive protocols, quality metrics, and best practice recommendations. A written personalized care plan for preventive services as well as general preventive health recommendations were provided to patient.     Varney Biles, LPN  [id number]

## 2017-06-09 DIAGNOSIS — M48062 Spinal stenosis, lumbar region with neurogenic claudication: Secondary | ICD-10-CM | POA: Diagnosis not present

## 2017-06-09 DIAGNOSIS — M5416 Radiculopathy, lumbar region: Secondary | ICD-10-CM | POA: Diagnosis not present

## 2017-06-09 DIAGNOSIS — M5136 Other intervertebral disc degeneration, lumbar region: Secondary | ICD-10-CM | POA: Diagnosis not present

## 2017-07-03 DIAGNOSIS — C44329 Squamous cell carcinoma of skin of other parts of face: Secondary | ICD-10-CM | POA: Diagnosis not present

## 2017-07-03 DIAGNOSIS — L905 Scar conditions and fibrosis of skin: Secondary | ICD-10-CM | POA: Diagnosis not present

## 2017-07-16 ENCOUNTER — Ambulatory Visit (INDEPENDENT_AMBULATORY_CARE_PROVIDER_SITE_OTHER): Payer: Medicare Other

## 2017-07-16 ENCOUNTER — Encounter: Payer: Self-pay | Admitting: Family Medicine

## 2017-07-16 ENCOUNTER — Ambulatory Visit (INDEPENDENT_AMBULATORY_CARE_PROVIDER_SITE_OTHER): Payer: Medicare Other | Admitting: Family Medicine

## 2017-07-16 VITALS — BP 140/80 | HR 68 | Temp 98.0°F | Wt 102.4 lb

## 2017-07-16 DIAGNOSIS — M25572 Pain in left ankle and joints of left foot: Secondary | ICD-10-CM | POA: Diagnosis not present

## 2017-07-16 DIAGNOSIS — E559 Vitamin D deficiency, unspecified: Secondary | ICD-10-CM

## 2017-07-16 DIAGNOSIS — R1310 Dysphagia, unspecified: Secondary | ICD-10-CM | POA: Diagnosis not present

## 2017-07-16 DIAGNOSIS — R634 Abnormal weight loss: Secondary | ICD-10-CM | POA: Insufficient documentation

## 2017-07-16 DIAGNOSIS — I480 Paroxysmal atrial fibrillation: Secondary | ICD-10-CM | POA: Diagnosis not present

## 2017-07-16 DIAGNOSIS — M7989 Other specified soft tissue disorders: Secondary | ICD-10-CM | POA: Diagnosis not present

## 2017-07-16 LAB — POCT URINALYSIS DIPSTICK
Bilirubin, UA: NEGATIVE
Blood, UA: NEGATIVE
Glucose, UA: NEGATIVE
Ketones, UA: NEGATIVE
Leukocytes, UA: NEGATIVE
NITRITE UA: NEGATIVE
PH UA: 6 (ref 5.0–8.0)
PROTEIN UA: NEGATIVE
Spec Grav, UA: 1.015 (ref 1.010–1.025)
UROBILINOGEN UA: 0.2 U/dL

## 2017-07-16 NOTE — Assessment & Plan Note (Signed)
Patient with steady decline and weight loss that is concerning.  Potentially related to GI cause given sensation of food sticking she swallows.  She has an overall benign exam today.  We will refer her to GI to consider EGD.  We will obtain lab work as outlined below.  If negative would consider imaging to evaluate for an occult primary malignancy leading to her weight loss.

## 2017-07-16 NOTE — Patient Instructions (Addendum)
Nice to meet you. We will get you to see GI. We will have you do lab work today. We will x-ray your left ankle.

## 2017-07-16 NOTE — Assessment & Plan Note (Signed)
Sinus rhythm.  Continue current regimen. 

## 2017-07-16 NOTE — Progress Notes (Signed)
Tommi Rumps, MD Phone: [phone]  Kylie May is a 84 y.o. female who presents today for follow-up.  Patient noted to have slowly declining weight loss over the last year and a half.  She notes this as well.  She does note feeling as though food sticks in her upper esophagus and throat at times.  No pain when she swallows though does have some reflux.  No blood in her stool.  No early satiety.  Occasional tickle in her throat that causes her to cough.  This is nonproductive.  No night sweats.  No abdominal pain.  No fevers.  She has daily soft bowel movements.  She feels she eats well.  No depression or anxiety.  Some increased frequency of urination though no dysuria.  No increased thirst.  No shortness of breath.  She reports she was up-to-date on mammograms and had no prior breast cancers.  Prior colonoscopy without polyps.  Patient notes left ankle lateral malleolus discomfort only at night for several weeks.  No injury.  It is just an aching sensation.  It is tender to palpation.  Patient continues on Eliquis, carvedilol, and flecainide for her A. fib.  She notes no symptoms.  Social History   Tobacco Use  Smoking Status Former Smoker  . Last attempt to quit: 06/04/1951  . Years since quitting: 66.1  Smokeless Tobacco Never Used  Tobacco Comment   Smoked 1945-1953 , up to 2 cigarettes/day     ROS see history of present illness  Objective  Physical Exam Vitals:   07/16/17 1448  BP: 140/80  Pulse: 68  Temp: 98 F (36.7 C)  SpO2: 98%    BP Readings from Last 3 Encounters:  07/16/17 140/80  05/30/17 130/70  01/14/17 130/78   Wt Readings from Last 3 Encounters:  07/16/17 102 lb 6.4 oz (46.4 kg)  05/30/17 102 lb 1.9 oz (46.3 kg)  01/14/17 104 lb 8 oz (47.4 kg)    Physical Exam  Constitutional: No distress.  HENT:  Head: Normocephalic and atraumatic.  Mouth/Throat: Oropharynx is clear and moist. No oropharyngeal exudate.  Neck: Neck supple.    Cardiovascular: Normal rate, regular rhythm and normal heart sounds.  Pulmonary/Chest: Effort normal and breath sounds normal.  Abdominal: Soft. Bowel sounds are normal. She exhibits no distension. There is no tenderness. There is no rebound and no guarding.  Musculoskeletal: She exhibits no edema.  Left ankle lateral malleolus with slight tender this, slight callus formed in this area, no erythema or warmth or induration  Lymphadenopathy:       Head (right side): No submental, no submandibular and no tonsillar adenopathy present.       Head (left side): No submental, no submandibular and no tonsillar adenopathy present.    She has no cervical adenopathy.    She has no axillary adenopathy.       Right: No inguinal and no supraclavicular adenopathy present.       Left: No inguinal and no supraclavicular adenopathy present.  Neurological: She is alert. Gait normal.  Skin: Skin is warm and dry. She is not diaphoretic.     Assessment/Plan: Please see individual problem list.  Weight loss Patient with steady decline and weight loss that is concerning.  Potentially related to GI cause given sensation of food sticking she swallows.  She has an overall benign exam today.  We will refer her to GI to consider EGD.  We will obtain lab work as outlined below.  If negative would  consider imaging to evaluate for an occult primary malignancy leading to her weight loss.  A-fib Sinus rhythm.  Continue current regimen.  Left ankle pain Unsure of cause.  Given tenderness over the malleolus will obtain an x-ray.   Orders Placed This Encounter  Procedures  . DG Ankle Complete Left    Standing Status:   Future    Number of Occurrences:   1    Standing Expiration Date:   09/14/2018    Order Specific Question:   Reason for Exam (SYMPTOM  OR DIAGNOSIS REQUIRED)    Answer:   left lateral malleolus tenderness and pain not improving, no injury    Order Specific Question:   Preferred imaging location?     Answer:   [facility] Specific Question:   Radiology Contrast Protocol - do NOT remove file path    Answer:   _0 [email or web address]  . Comp Met (CMET)  . CBC w/Diff  . TSH  . Sedimentation rate  . Vitamin D (25 hydroxy)  . Ambulatory referral to Gastroenterology    Referral Priority:   Routine    Referral Type:   Consultation    Referral Reason:   Specialty Services Required    Number of Visits Requested:   1  . POCT Urinalysis Dipstick    No orders of the defined types were placed in this encounter.    Tommi Rumps, MD [place]

## 2017-07-16 NOTE — Assessment & Plan Note (Signed)
Unsure of cause.  Given tenderness over the malleolus will obtain an x-ray.

## 2017-07-17 LAB — COMPREHENSIVE METABOLIC PANEL
ALBUMIN: 4.1 g/dL (ref 3.5–5.2)
ALK PHOS: 42 U/L (ref 39–117)
ALT: 10 U/L (ref 0–35)
AST: 19 U/L (ref 0–37)
BILIRUBIN TOTAL: 0.4 mg/dL (ref 0.2–1.2)
BUN: 13 mg/dL (ref 6–23)
CO2: 30 mEq/L (ref 19–32)
CREATININE: 0.93 mg/dL (ref 0.40–1.20)
Calcium: 9.7 mg/dL (ref 8.4–10.5)
Chloride: 100 mEq/L (ref 96–112)
GFR: 59.71 mL/min — ABNORMAL LOW (ref >60.00)
Glucose, Bld: 89 mg/dL (ref 70–99)
POTASSIUM: 4.8 meq/L (ref 3.5–5.1)
SODIUM: 136 meq/L (ref 135–145)
TOTAL PROTEIN: 7.2 g/dL (ref 6.0–8.3)

## 2017-07-17 LAB — CBC WITH DIFFERENTIAL/PLATELET
Basophils Absolute: 0.1 10*3/uL (ref 0.0–0.1)
Basophils Relative: 0.7 % (ref 0.0–3.0)
EOS ABS: 0.2 10*3/uL (ref 0.0–0.7)
Eosinophils Relative: 2.3 % (ref 0.0–5.0)
HCT: 39.2 % (ref 36.0–46.0)
HEMOGLOBIN: 13.3 g/dL (ref 12.0–15.0)
Lymphocytes Relative: 39.5 % (ref 12.0–46.0)
Lymphs Abs: 3.1 10*3/uL (ref 0.7–4.0)
MCHC: 33.9 g/dL (ref 30.0–36.0)
MCV: 90.4 fl (ref 78.0–100.0)
Monocytes Absolute: 0.8 10*3/uL (ref 0.1–1.0)
Monocytes Relative: 10 % (ref 3.0–12.0)
Neutro Abs: 3.7 10*3/uL (ref 1.4–7.7)
Neutrophils Relative %: 47.5 % (ref 43.0–77.0)
Platelets: 304 10*3/uL (ref 150.0–400.0)
RBC: 4.34 Mil/uL (ref 3.87–5.11)
RDW: 12.9 % (ref 11.5–15.5)
WBC: 7.8 10*3/uL (ref 4.0–10.5)

## 2017-07-17 LAB — TSH: TSH: 18 u[IU]/mL — ABNORMAL HIGH (ref 0.35–4.50)

## 2017-07-17 LAB — SEDIMENTATION RATE: Sed Rate: 6 mm/hr (ref 0–30)

## 2017-07-17 LAB — VITAMIN D 25 HYDROXY (VIT D DEFICIENCY, FRACTURES): VITD: 44.13 ng/mL (ref 30.00–100.00)

## 2017-07-18 ENCOUNTER — Other Ambulatory Visit: Payer: Self-pay | Admitting: Radiology

## 2017-07-18 ENCOUNTER — Other Ambulatory Visit (INDEPENDENT_AMBULATORY_CARE_PROVIDER_SITE_OTHER): Payer: Medicare Other

## 2017-07-18 DIAGNOSIS — R7989 Other specified abnormal findings of blood chemistry: Secondary | ICD-10-CM | POA: Diagnosis not present

## 2017-07-18 LAB — T3, FREE: T3, Free: 2.8 pg/mL (ref 2.3–4.2)

## 2017-07-18 LAB — T4, FREE: Free T4: 0.73 ng/dL (ref 0.60–1.60)

## 2017-07-25 DIAGNOSIS — L259 Unspecified contact dermatitis, unspecified cause: Secondary | ICD-10-CM | POA: Diagnosis not present

## 2017-08-04 DIAGNOSIS — L309 Dermatitis, unspecified: Secondary | ICD-10-CM | POA: Diagnosis not present

## 2017-08-17 ENCOUNTER — Other Ambulatory Visit: Payer: Self-pay

## 2017-08-18 ENCOUNTER — Encounter (INDEPENDENT_AMBULATORY_CARE_PROVIDER_SITE_OTHER): Payer: Self-pay

## 2017-08-18 ENCOUNTER — Encounter: Payer: Self-pay | Admitting: Gastroenterology

## 2017-08-18 ENCOUNTER — Ambulatory Visit (INDEPENDENT_AMBULATORY_CARE_PROVIDER_SITE_OTHER): Payer: Medicare Other | Admitting: Gastroenterology

## 2017-08-18 VITALS — BP 142/73 | HR 80 | Ht 61.0 in | Wt 104.8 lb

## 2017-08-18 DIAGNOSIS — R1319 Other dysphagia: Secondary | ICD-10-CM

## 2017-08-18 DIAGNOSIS — R1310 Dysphagia, unspecified: Secondary | ICD-10-CM | POA: Insufficient documentation

## 2017-08-18 NOTE — Progress Notes (Signed)
Cephas Darby, MD [address]  [place], [place] [address]  Main: [phone]  Fax: [phone]    Gastroenterology Consultation  Referring Provider:     [name], MD Primary Care Physician:  [name], MD Primary Gastroenterologist:  Dr. Cephas Darby Reason for Consultation:     dysphagia        HPI:   Kylie May is a 84 y.o. female referred by Dr. Caryl Bis, Angela Adam, MD  for consultation & management of dysphagia. Patient has history of paroxysmal A. Fib, on Eliquis presents with intermittent episodes of chronic difficulty swallowing to liquids. Patient reports sensation of food stuck in her throat, particularly bread, hard vegetables which happens a few times in a month. These episodes do not happen on a regular basis. Patient's weight has been stable within 10 pounds variation. She had upper endoscopy in 2006 which revealed moderate-sized hiatal hernia and Schatzki's ring that was dilated. She takes omeprazole as needed. She denies heartburn, regurgitation, epigastric pain. She denies having episodes of food impactions  NSAIDs: none  Antiplts/Anticoagulants/Anti thrombotics: Eliquis for paroxysmal A. fib  GI Procedures:  EGD by Dr. Velora Heckler at [facility] in 2006 Medium-size hiatal hernia, Schatzki's ring dilated with Venia Minks 79F  Colonoscopy by Dr Velora Heckler at [facility] in 2006 Moderate diverticulosis, otherwise no polyps detected  Past Medical History:  Diagnosis Date  . A-fib ([place])   . Acid reflux disease   . Arrhythmia   . Chronic diastolic CHF (congestive heart failure) ([place])   . Compression fracture of lumbar vertebra ([place])   . Diverticulitis 01-2008   [facility]  . Diverticulosis   . Esophageal stricture 2006  . Femoral bruit    Right  . Fibrocystic breast disease   . Hiatal hernia   . Hypercholesterolemia    [place] study LDL goal = < 160  . Mitral regurgitation    a. 06/2014 EF  55-60%, elevated end-diastolic pressures, dilated LA at 4.3 cm, mildly dilated RA, severe mitral regurgitation, mild aortic sclerosis without stenosis, mod-severe TR  . Mitral valve prolapse   . Osteoporosis    Dr Matthew Saras  . Pancreatitis 07-2008   Hospitalized   . Schatzki's ring   . Skin cancer    facial x 2. [name]    Past Surgical History:  Procedure Laterality Date  . COLONOSCOPY  2006  . Epidural steroids     x1  . ESOPHAGEAL DILATION  2006  . G3 P4 (twins)    . SKIN CANCER EXCISION       Current Outpatient Medications:  .  carvedilol (COREG) 3.125 MG tablet, TAKE 1 TABLET TWICE A DAY WITH MEALS, Disp: 60 tablet, Rfl: 0 .  ELIQUIS 2.5 MG TABS tablet, TAKE 1 TABLET TWICE A DAY, Disp: 180 tablet, Rfl: 1 .  flecainide (TAMBOCOR) 50 MG tablet, Take 1 tablet (50 mg total) by mouth 2 (two) times daily., Disp: 180 tablet, Rfl: 1 .  hydrocortisone 2.5 % ointment, , Disp: , Rfl:  .  magnesium oxide (MAG-OX) 400 MG tablet, Take 1 tablet (400 mg total) by mouth daily., Disp: 30 tablet, Rfl: 0 .  omeprazole (PRILOSEC) 40 MG capsule, , Disp: , Rfl:  .  rosuvastatin (CRESTOR) 20 MG tablet, TAKE 1 TABLET DAILY, Disp: 90 tablet, Rfl: 0 .  NEXIUM 20 MG capsule, Take 1 capsule (20 mg total) by mouth daily at 12 noon. (Patient not taking: Reported on 08/18/2017), Disp: 90  capsule, Rfl: 3   Family History  Problem Relation Age of Onset  . Breast cancer Maternal Aunt   . Hepatitis Daughter        C  . Lung disease Mother        lung tumor  . Heart attack Daughter 67       S/P stents  . Heart attack Sister 10  . Diabetes Neg Hx   . Stroke Neg Hx      Social History   Tobacco Use  . Smoking status: Former Smoker    Last attempt to quit: 06/04/1951    Years since quitting: 66.2  . Smokeless tobacco: Never Used  . Tobacco comment: Smoked 1945-1953 , up to 2 cigarettes/day  Substance Use Topics  . Alcohol use: Yes    Alcohol/week: 4.2 oz    Types: 7 Glasses of wine per week  .  Drug use: No    Allergies as of 08/18/2017 - Review Complete 08/18/2017  Allergen Reaction Noted  . Metronidazole  11/11/2006  . Penicillin g Rash 08/11/2015    Review of Systems:    All systems reviewed and negative except where noted in HPI.   Physical Exam:  BP (!) 142/73   Pulse 80   Ht 5\' 1"  (1.549 m)   Wt 47.5 kg (104 lb 12.8 oz)   BMI 19.80 kg/m  No LMP recorded. Patient is postmenopausal.  General:   Alert,  Well-developed, well-nourished, pleasant and cooperative in NAD Head:  Normocephalic and atraumatic. Eyes:  Sclera clear, no icterus.   Conjunctiva pink. Ears:  Normal auditory acuity. Nose:  No deformity, discharge, or lesions. Mouth:  No deformity or lesions,oropharynx pink & moist. Neck:  Supple; no masses or thyromegaly. Lungs:  Respirations even and unlabored.  Clear throughout to auscultation.   No wheezes, crackles, or rhonchi. No acute distress. Heart:  Regular rate and rhythm; no murmurs, clicks, rubs, or gallops. Abdomen:  Normal bowel sounds. Soft, non-tender and non-distended without masses, hepatosplenomegaly or hernias noted.  No guarding or rebound tenderness.   Rectal: Not performed Msk:  Symmetrical without gross deformities. Good, equal movement & strength bilaterally. Pulses:  Normal pulses noted. Extremities:  No clubbing or edema.  No cyanosis. Neurologic:  Alert and oriented x3;  grossly normal neurologically. Skin:  Intact without significant lesions or rashes. No jaundice. Lymph Nodes:  No significant cervical adenopathy. Psych:  Alert and cooperative. Normal mood and affect.  Imaging Studies: None  Assessment and Plan:   Kylie May is a 84 y.o. female with paroxysmal A. Fib, rate controlled, on Coreg and Eliquis, known history of hiatal hernia and Schatzki's ring is seen in consultation for further evaluation of chronic intermittent dysphagia to solids.  Given her age, recommend barium esophagogram EGD based on the above  results or if symptoms are progressive Continue omeprazole  Follow up in 4 weeks  Cephas Darby, MD

## 2017-08-22 ENCOUNTER — Telehealth: Payer: Self-pay | Admitting: Cardiovascular Disease

## 2017-08-22 MED ORDER — CARVEDILOL 3.125 MG PO TABS: 3.1250 mg | ORAL_TABLET | Freq: Two times a day (BID) | ORAL | 3 refills | Status: DC

## 2017-08-22 MED ORDER — CARVEDILOL 3.125 MG PO TABS: 3.1250 mg | ORAL_TABLET | Freq: Two times a day (BID) | ORAL | 0 refills | Status: DC

## 2017-08-22 NOTE — Telephone Encounter (Signed)
STAT* If patient is at the pharmacy, call can be transferred to refill team.  1. Which medications need to be refilled? (please list name of each medication and dose if known)   Carvedilol   2. Which pharmacy/location (including street and city if local pharmacy) is medication to be sent to?  [facility]   3. Do they need a 30 day or 90 day supply?   30 day [facility]

## 2017-08-27 ENCOUNTER — Ambulatory Visit
Admission: RE | Admit: 2017-08-27 | Discharge: 2017-08-27 | Disposition: A | Discharge status: Home / Self Care | Payer: Medicare Other | Source: Ambulatory Visit | Attending: Gastroenterology | Admitting: Gastroenterology

## 2017-08-27 ENCOUNTER — Telehealth: Payer: Self-pay

## 2017-08-27 ENCOUNTER — Other Ambulatory Visit: Payer: Self-pay

## 2017-08-27 DIAGNOSIS — K449 Diaphragmatic hernia without obstruction or gangrene: Secondary | ICD-10-CM | POA: Insufficient documentation

## 2017-08-27 DIAGNOSIS — R1310 Dysphagia, unspecified: Secondary | ICD-10-CM | POA: Diagnosis not present

## 2017-08-27 DIAGNOSIS — R1319 Other dysphagia: Secondary | ICD-10-CM

## 2017-08-27 MED ORDER — OMEPRAZOLE 40 MG PO CPDR: 40.0000 mg | DELAYED_RELEASE_CAPSULE | Freq: Every day | ORAL | 1 refills | Status: DC

## 2017-08-27 MED ORDER — OMEPRAZOLE 40 MG PO CPDR: 40.0000 mg | DELAYED_RELEASE_CAPSULE | Freq: Every day | ORAL | 0 refills | Status: DC

## 2017-08-27 NOTE — Telephone Encounter (Signed)
-----   Message from [name], MD sent at 08/27/2017  1:28 PM EDT ----- Please inform patient that the x-ray esophagus revealed medium-sized hiatal hernia. There is no evidence of stricture or obstruction. I would not recommend EGD at this time. She may have inflammation in her esophagus which can cause difficulty swallowing. I recommended to continue omeprazole 20 mg daily at least for 3 months  Sherri Sear, M.D.

## 2017-08-27 NOTE — Telephone Encounter (Signed)
Results were provided to patients daughter.  She stated that her mother had already been on 20mg  of omeprazole and had not seen any results.  Instead, of 20 I've sent a rx for 40mg  daily to local pharmacy, and a [facility].

## 2017-09-02 ENCOUNTER — Encounter: Payer: Self-pay | Admitting: Family Medicine

## 2017-09-02 ENCOUNTER — Ambulatory Visit (INDEPENDENT_AMBULATORY_CARE_PROVIDER_SITE_OTHER): Payer: Medicare Other | Admitting: Family Medicine

## 2017-09-02 ENCOUNTER — Other Ambulatory Visit: Payer: Self-pay

## 2017-09-02 VITALS — BP 120/66 | HR 82 | Temp 97.4°F | Ht 61.0 in | Wt 104.0 lb

## 2017-09-02 DIAGNOSIS — R634 Abnormal weight loss: Secondary | ICD-10-CM | POA: Diagnosis not present

## 2017-09-02 DIAGNOSIS — R7989 Other specified abnormal findings of blood chemistry: Secondary | ICD-10-CM

## 2017-09-02 DIAGNOSIS — E039 Hypothyroidism, unspecified: Secondary | ICD-10-CM | POA: Diagnosis not present

## 2017-09-02 DIAGNOSIS — R0789 Other chest pain: Secondary | ICD-10-CM | POA: Diagnosis not present

## 2017-09-02 DIAGNOSIS — R0781 Pleurodynia: Secondary | ICD-10-CM

## 2017-09-02 DIAGNOSIS — R1310 Dysphagia, unspecified: Secondary | ICD-10-CM

## 2017-09-02 DIAGNOSIS — R21 Rash and other nonspecific skin eruption: Secondary | ICD-10-CM

## 2017-09-02 DIAGNOSIS — R1319 Other dysphagia: Secondary | ICD-10-CM

## 2017-09-02 LAB — TSH: TSH: 9.38 u[IU]/mL — ABNORMAL HIGH (ref 0.35–4.50)

## 2017-09-02 LAB — T4, FREE: FREE T4: 0.75 ng/dL (ref 0.60–1.60)

## 2017-09-02 NOTE — Patient Instructions (Signed)
Nice to see you. Please try the omeprazole and if you have recurrence of that discomfort please discontinue this. We will check lab work today. We will get you set up for CT scans.

## 2017-09-02 NOTE — Assessment & Plan Note (Addendum)
Patient reports this is improved.  She did see GI.  Possibly GERD related.  She will continue PPI therapy.

## 2017-09-02 NOTE — Progress Notes (Signed)
Tommi Rumps, MD Phone: [phone]  Kylie May is a 84 y.o. female who presents today for f/u.   Weight loss: Patient's weight has increased about 2 pounds since her last visit.  Her appetite has been okay.  Things do not taste as good.  She is eating less than she used to.  She did see GI for her dysphagia and they did an x-ray of her esophagus that revealed a hiatal hernia and they recommend placing her on omeprazole 40 mg daily for possible inflammation as that can make it difficult to swallow at times.  She notes no prior abnormal mammograms.  Colon cancer screening is up-to-date.  See prior note for other details on weight loss.  She notes not trying to lose weight.  She reports the day she took the new omeprazole dose for the first time she had a slight discomfort in her left lower anterior ribs just near her xiphoid process.  Felt like something hit there and built up and then went away.  Lasted briefly.  No chest pain with this.  No shortness of breath.  No diaphoresis.  Nothing exacerbated it.  It eased off with exertion.  Happened about a week ago.  Has not recurred.  She wondered if it was related to the medication or if it was just something she ate.  She notes no history of hypothyroidism and has not been treated for this previously.  Needs repeat labs.  She notes a red spot on her left scalp that she just noted.  It is a slight bump that is a little tender and itches at times.  She sees dermatology soon.  She was seen at the walk-in clinic for a rash and they initially gave her a pill though this was not terribly beneficial.  She noted a rash in her groin and over her back though that has gone away after follow-up with them and treatment with a topical treatment.  Social History   Tobacco Use  Smoking Status Former Smoker  . Last attempt to quit: 06/04/1951  . Years since quitting: 66.2  Smokeless Tobacco Never Used  Tobacco Comment   Smoked 1945-1953 , up to 2  cigarettes/day     ROS see history of present illness  Objective  Physical Exam Vitals:   09/02/17 1442  BP: 120/66  Pulse: 82  Temp: (!) 97.4 F (36.3 C)  SpO2: 98%    BP Readings from Last 3 Encounters:  09/02/17 120/66  08/18/17 (!) 142/73  07/16/17 140/80   Wt Readings from Last 3 Encounters:  09/02/17 104 lb (47.2 kg)  08/18/17 104 lb 12.8 oz (47.5 kg)  07/16/17 102 lb 6.4 oz (46.4 kg)    Physical Exam  Constitutional: No distress.  HENT:  Head:    Neck: Neck supple.  Cardiovascular: Normal rate, regular rhythm and normal heart sounds.  Pulmonary/Chest: Effort normal and breath sounds normal.  No tenderness over her anterior lower ribs  Abdominal: Soft. Bowel sounds are normal. She exhibits no distension. There is no tenderness. There is no rebound and no guarding.  Musculoskeletal: She exhibits no edema.  Lymphadenopathy:    She has no cervical adenopathy.  Neurological: She is alert. Gait normal.  Skin: Skin is warm and dry. She is not diaphoretic.     Assessment/Plan: Please see individual problem list.  Dysphagia Patient reports this is improved.  She did see GI.  Possibly GERD related.  She will continue PPI therapy.  Weight loss Weight is  up about 2 pounds.  No obvious cause for weight loss at this time other than possible dysphagia.  Lab workup was unremarkable other than elevated TSH which would not cause weight loss.  I discussed the option of monitoring versus proceeding with CT scan chest abdomen pelvis given the unintentional weight loss.  We opted for CT scan chest abdomen and pelvis though I am unable to find a diagnosis that would accurately reflect what is going on and still be covered.  I will have our referral coordinator check into this.  Patient also wondered whether or not she should see gynecology for follow-up as she has not seen them in a couple of years.  I encouraged that.  She will make the appointment.  Rib pain I suspect the  pain she had in her left lower ribs might have been MSK related or related to GERD. Less likely related to the PPI. I encouraged her to trial the PPI again and if this recurs let us know.   Rash Resolved. Monitor for recurrence. She will see her dermatologist for the lesion on her head.   Elevated TSH Recheck TSH and free T4.   Orders Placed This Encounter  Procedures  . TSH  . T4, free    No orders of the defined types were placed in this encounter.    Tommi Rumps, MD [place]

## 2017-09-04 DIAGNOSIS — R0781 Pleurodynia: Secondary | ICD-10-CM | POA: Insufficient documentation

## 2017-09-04 DIAGNOSIS — R21 Rash and other nonspecific skin eruption: Secondary | ICD-10-CM | POA: Insufficient documentation

## 2017-09-04 DIAGNOSIS — R7989 Other specified abnormal findings of blood chemistry: Secondary | ICD-10-CM | POA: Insufficient documentation

## 2017-09-04 NOTE — Assessment & Plan Note (Signed)
I suspect the pain she had in her left lower ribs might have been MSK related or related to GERD. Less likely related to the PPI. I encouraged her to trial the PPI again and if this recurs let us know.

## 2017-09-04 NOTE — Assessment & Plan Note (Signed)
Resolved. Monitor for recurrence. She will see her dermatologist for the lesion on her head.

## 2017-09-04 NOTE — Assessment & Plan Note (Addendum)
Weight is up about 2 pounds.  No obvious cause for weight loss at this time other than possible dysphagia.  Lab workup was unremarkable other than elevated TSH which would not cause weight loss.  I discussed the option of monitoring versus proceeding with CT scan chest abdomen pelvis given the unintentional weight loss.  We opted for CT scan chest abdomen and pelvis though I am unable to find a diagnosis that would accurately reflect what is going on and still be covered.  I will have our referral coordinator check into this.  Patient also wondered whether or not she should see gynecology for follow-up as she has not seen them in a couple of years.  I encouraged that.  She will make the appointment.

## 2017-09-04 NOTE — Assessment & Plan Note (Signed)
Recheck TSH and free T4 

## 2017-09-05 NOTE — Progress Notes (Signed)
Dr. Caryl Bis, see if this works.    Hi Kylie May,  CT of Abd/pelvic dx of the weight loss R63.4 should work for that one.   For CT of chest the he could use the rib pain/R07.81  These are both on the Medicare LCD;  Let me know if this works for him.    I didnt see 1 combination CPT for the ABD/Pelvic/Chest CT;      Thx, Kylie May

## 2017-09-07 NOTE — Addendum Note (Signed)
Addended by: [name] on: 09/07/2017 06:01 PM   Modules accepted: Orders

## 2017-09-08 ENCOUNTER — Telehealth: Payer: Self-pay | Admitting: Cardiovascular Disease

## 2017-09-08 MED ORDER — ROSUVASTATIN CALCIUM 20 MG PO TABS
ORAL_TABLET | ORAL | 0 refills | Status: DC
Start: 2017-09-08 — End: 2017-09-23

## 2017-09-08 MED ORDER — FLECAINIDE ACETATE 50 MG PO TABS: 50.0000 mg | ORAL_TABLET | Freq: Two times a day (BID) | ORAL | 0 refills | Status: DC

## 2017-09-08 NOTE — Telephone Encounter (Signed)
°*  STAT* If patient is at the pharmacy, call can be transferred to refill team.   1. Which medications need to be refilled? (please list name of each medication and dose if known)     Rosuvastatin 20 mg po q day taking differently Mon Wed Fri    Flecainide 50 mg po BID   2. Which pharmacy/location (including street and city if local pharmacy) is medication to be sent to?    [facility]   3. Do they need a 30 day or 90 day supply? [place]

## 2017-09-08 NOTE — Telephone Encounter (Signed)
Spoke with patients daughter and advised that patient is overdue for her follow up appointment with Dr. Rockey Situ. Advised that I would send in a 90 day supply of her medication but that she needs to have upcoming appointment. Reviewed with her that I would have scheduling give her a call to set up appointment and will send in her prescription. She verbalized understanding of our conversation, agreement with plan, and had no further questions at this time.

## 2017-09-08 NOTE — Telephone Encounter (Signed)
Scheduled

## 2017-09-10 NOTE — Addendum Note (Signed)
Addended by: [name] on: 09/10/2017 08:42 PM   Modules accepted: Orders

## 2017-09-15 ENCOUNTER — Telehealth: Payer: Self-pay | Admitting: Family Medicine

## 2017-09-15 NOTE — Telephone Encounter (Signed)
Copied from [place] [id number]. Topic: Quick Communication - Office Called Patient >> Sep 12, 2017  9:05 AM Micheline Maze B wrote: Reason for CRM: lvm for both Butch Penny and Hassan Rowan to rtc re: mom's upcoming appointment. Please send call to me. >> Sep 15, 2017 10:13 AM [place], Laban Emperor wrote: Hassan Rowan pt daughter calling Melissa back.  Please f/u back up with her.

## 2017-09-16 ENCOUNTER — Ambulatory Visit
Admission: RE | Admit: 2017-09-16 | Discharge: 2017-09-16 | Disposition: A | Discharge status: Home / Self Care | Payer: Medicare Other | Source: Ambulatory Visit | Attending: Family Medicine | Admitting: Family Medicine

## 2017-09-16 ENCOUNTER — Encounter: Payer: Self-pay | Admitting: Family Medicine

## 2017-09-16 DIAGNOSIS — R634 Abnormal weight loss: Secondary | ICD-10-CM | POA: Diagnosis not present

## 2017-09-16 DIAGNOSIS — K8020 Calculus of gallbladder without cholecystitis without obstruction: Secondary | ICD-10-CM | POA: Insufficient documentation

## 2017-09-16 DIAGNOSIS — K5730 Diverticulosis of large intestine without perforation or abscess without bleeding: Secondary | ICD-10-CM | POA: Diagnosis not present

## 2017-09-16 DIAGNOSIS — I700 Atherosclerosis of aorta: Secondary | ICD-10-CM | POA: Insufficient documentation

## 2017-09-16 DIAGNOSIS — M438X6 Other specified deforming dorsopathies, lumbar region: Secondary | ICD-10-CM | POA: Diagnosis not present

## 2017-09-16 DIAGNOSIS — M4186 Other forms of scoliosis, lumbar region: Secondary | ICD-10-CM | POA: Diagnosis not present

## 2017-09-16 LAB — POCT I-STAT CREATININE: Creatinine, Ser: 1 mg/dL (ref 0.44–1.00)

## 2017-09-16 MED ORDER — IOHEXOL 300 MG/ML  SOLN
60.0000 mL | Freq: Once | INTRAMUSCULAR | Status: AC | PRN
  Administered 2017-09-16: 60 mL via INTRAVENOUS

## 2017-09-21 NOTE — Progress Notes (Signed)
Cardiology Office Note  Date:  09/23/2017   ID:  ADIAH GUERECA, DOB [birth year], MRN [id number]  PCP:  [name], MD   Chief Complaint  Patient presents with  . OTHER    6 month f/u discuss CT. Meds reviewed verbally with pt.    HPI:  84 year old female with history of  paroxysmal asymptomatic  a-fib,   HTN,  hiatal hernia,  osteopenia,  degenerative joint disease, and  Scoliosis Long history of falls, 2018 prior fall with pelvic fracture  admitted to [facility] 1/7-1/10/16 for new onset a-fib  And mild diastolic CHF, who presents today for follow-up of her atrial fibrillation aortic atherosclerosis,   She is very hard of hearing, presents with her daughter Recent trouble maintaining her weight Weight is down 3 pounds from prior clinic visit 6 months ago Daughter concerned about poor diet, lots of pasta with [place] Jewel, lots of ice cream   taking her Crestor 3 days a week with no myalgias Total cholesterol in 170s  Denies any symptoms concerning for atrial fibrillation, no tachycardia or palpitations Amiodarone previously held for elevated TSH  No recent falls On prior office visit had fall after missing a step, suffered pelvic fracture 2 areas  CT ABD images pulled up in the office and discussed with her in detail Scoliosis noted Moderate diffuse abdominal aortic atherosclerosis. No evidence of aneurysm  Daughter reports that patient does her own medications, puts them in a pillbox  No chest pain or shortness of breath on exertion Denies any claudication symptoms  EKG personally reviewed by myself on todays visit Shows normal sinus rhythm rate 72 bpm no significant ST or T wave changes  Other past medical history reviewed  on amiodarone, she  had a climb in her TSH , peak of 19, with repeat down to 16  Amiodarone was held and she was started on flecainide   Echocardiogram 06/09/2014 shows normal LV function, severely dilated left atrium, severe MR, moderate  to severe TR  Beginning after Christmas 2015 she developed worsening fatigue as well as lower extremity edema.  She was seen at urgent care and noted to have an elevated heart rate and was brought to [facility] for further evaluation.  She was found to be in a-fib with RVR. She was treated initially with IV Cardizem, however her BP became soft and was changed to amiodarone gtt. She did convert to NSR and was continued on po amiodarone at 400 mg bid at discharge.  She was placed on apixaban 2.5 mg bid (age 59 and weight 54.3 kg).    PMH:   has a past medical history of A-fib ([place]), Acid reflux disease, Arrhythmia, Chronic diastolic CHF (congestive heart failure) ([place]), Compression fracture of lumbar vertebra ([place]), Diverticulitis (01-2008), Diverticulosis, Esophageal stricture (2006), Femoral bruit, Fibrocystic breast disease, Hiatal hernia, Hypercholesterolemia, Mitral regurgitation, Mitral valve prolapse, Osteoporosis, Pancreatitis (07-2008), Schatzki's ring, and Skin cancer.  PSH:    Past Surgical History:  Procedure Laterality Date  . COLONOSCOPY  2006  . Epidural steroids     x1  . ESOPHAGEAL DILATION  2006  . G3 P4 (twins)    . SKIN CANCER EXCISION      Current Outpatient Medications  Medication Sig Dispense Refill  . carvedilol (COREG) 3.125 MG tablet Take 1 tablet (3.125 mg total) by mouth 2 (two) times daily with a meal. 180 tablet 3  . ELIQUIS 2.5 MG TABS tablet TAKE 1 TABLET TWICE A DAY 180 tablet 1  .  flecainide (TAMBOCOR) 50 MG tablet Take 1 tablet (50 mg total) by mouth 2 (two) times daily. 180 tablet 0  . hydrocortisone 2.5 % ointment     . magnesium oxide (MAG-OX) 400 MG tablet Take 1 tablet (400 mg total) by mouth daily. 30 tablet 0  . omeprazole (PRILOSEC) 40 MG capsule Take 40 mg by mouth daily.    . rosuvastatin (CRESTOR) 20 MG tablet Taking Monday wednesday and friday 90 tablet 0   No current facility-administered medications for this visit.      Allergies:    Metronidazole and Penicillin g   Social History:  The patient  reports that she quit smoking about 66 years ago. She has never used smokeless tobacco. She reports that she drinks about 4.2 oz of alcohol per week. She reports that she does not use drugs.   Family History:   family history includes Breast cancer in her maternal aunt; Heart attack (age of onset: 19) in her daughter; Heart attack (age of onset: 70) in her sister; Hepatitis in her daughter; Lung disease in her mother.    Review of Systems: Review of Systems  Constitutional: Negative.   HENT: Positive for hearing loss.   Respiratory: Negative.   Cardiovascular: Negative.   Gastrointestinal: Negative.   Musculoskeletal: Negative.   Neurological: Negative.   Psychiatric/Behavioral: Negative.   All other systems reviewed and are negative.    PHYSICAL EXAM: VS:  BP 118/70 (BP Location: Left Arm, Patient Position: Sitting, Cuff Size: Normal)   Pulse 72   Ht 5' (1.524 m)   Wt 103 lb 8 oz (46.9 kg)   BMI 20.21 kg/m  , BMI Body mass index is 20.21 kg/m. GEN: Well nourished, well developed, in no acute distress , hard of hearing HEENT: normal  Neck: no JVD, carotid bruits, or masses Cardiac: RRR; no murmurs, rubs, or gallops,no edema  Respiratory:  clear to auscultation bilaterally, normal work of breathing GI: soft, nontender, nondistended, + BS MS: no deformity or atrophy , left hand middle finger swollen, tender to palpation Skin: warm and dry, no rash Neuro:  Strength and sensation are intact Psych: euthymic mood, full affect   Recent Labs: 07/16/2017: ALT 10; BUN 13; Hemoglobin 13.3; Platelets 304.0; Potassium 4.8; Sodium 136 09/02/2017: TSH 9.38 09/16/2017: Creatinine, Ser 1.00    Lipid Panel Lab Results  Component Value Date   CHOL 173 01/14/2017   HDL 67.30 01/14/2017   LDLCALC 81 01/14/2017   TRIG 121.0 01/14/2017      Wt Readings from Last 3 Encounters:  09/23/17 103 lb 8 oz (46.9 kg)  09/02/17 104  lb (47.2 kg)  08/18/17 104 lb 12.8 oz (47.5 kg)       ASSESSMENT AND PLAN:  Paroxysmal atrial fibrillation (HCC) - Plan: EKG 12-Lead Maintaining normal sinus rhythm, tolerating anticoagulation On appropriate Eliquis dosing given her age and low weight  Chronic diastolic CHF (congestive heart failure) ([place]) - Plan: EKG 12-Lead Appears euvolemic on today's visit, no changes to her medications No diuretics needed at this time  Pure hypercholesterolemia Given moderate diffuse aortic atherosclerosis recommended she increase Crestor up to 20 mg daily Denies any myalgias, goal LDL less than 70   aortic atherosclerosis Noted on CT scan,  Images pulled up and discussed with her, no aneurysm noted Denies having any claudication symptoms Patient does not want any further doctor visits, daughter does not want any more work-up as she is asymptomatic Recommended aggressive lipid management and will hold off on lower extremity  arterial Dopplers  Encounter for anticoagulation discussion and counseling She feels comfortable staying on her anticoagulation She does have history of falls Denies having recent fall  Long discussion concerning aortic atherosclerosis, diet, medication changes  Total encounter time more than 45 minutes  Greater than 50% was spent in counseling and coordination of care with the patient  Disposition:   F/U  12 months   Orders Placed This Encounter  Procedures  . EKG 12-Lead     Signed, Esmond Plants, M.D., Ph.D. 09/23/2017  [place], [place]

## 2017-09-23 ENCOUNTER — Ambulatory Visit (INDEPENDENT_AMBULATORY_CARE_PROVIDER_SITE_OTHER): Payer: Medicare Other | Admitting: Cardiovascular Disease

## 2017-09-23 ENCOUNTER — Encounter: Payer: Self-pay | Admitting: Cardiovascular Disease

## 2017-09-23 VITALS — BP 118/70 | HR 72 | Ht 60.0 in | Wt 103.5 lb

## 2017-09-23 DIAGNOSIS — I340 Nonrheumatic mitral (valve) insufficiency: Secondary | ICD-10-CM | POA: Diagnosis not present

## 2017-09-23 DIAGNOSIS — I5032 Chronic diastolic (congestive) heart failure: Secondary | ICD-10-CM

## 2017-09-23 DIAGNOSIS — I480 Paroxysmal atrial fibrillation: Secondary | ICD-10-CM | POA: Diagnosis not present

## 2017-09-23 DIAGNOSIS — I700 Atherosclerosis of aorta: Secondary | ICD-10-CM

## 2017-09-23 DIAGNOSIS — E7800 Pure hypercholesterolemia, unspecified: Secondary | ICD-10-CM | POA: Diagnosis not present

## 2017-09-23 MED ORDER — CARVEDILOL 3.125 MG PO TABS: 3.1250 mg | ORAL_TABLET | Freq: Two times a day (BID) | ORAL | 3 refills | Status: DC

## 2017-09-23 MED ORDER — FLECAINIDE ACETATE 50 MG PO TABS: 50.0000 mg | ORAL_TABLET | Freq: Two times a day (BID) | ORAL | 3 refills | Status: DC

## 2017-09-23 MED ORDER — APIXABAN 2.5 MG PO TABS: 2.5000 mg | ORAL_TABLET | Freq: Two times a day (BID) | ORAL | 3 refills | Status: DC

## 2017-09-23 MED ORDER — ROSUVASTATIN CALCIUM 20 MG PO TABS: 20.0000 mg | ORAL_TABLET | Freq: Every day | ORAL | 3 refills | Status: DC

## 2017-09-23 NOTE — Patient Instructions (Addendum)
Medication Instructions:  Your physician has recommended you make the following change in your medication:  1. INCREASE Crestor to once daily   Labwork:  No new labs needed  Testing/Procedures:  No further testing at this time   Follow-Up: It was a pleasure seeing you in the office today. Please call us if you have new issues that need to be addressed before your next appt.  [phone]  Your physician wants you to follow-up in: 12 months.  You will receive a reminder letter in the mail two months in advance. If you don't receive a letter, please call our office to schedule the follow-up appointment.  If you need a refill on your cardiac medications before your next appointment, please call your pharmacy.  For educational health videos Log in to : [email or web address] Or : [email or web address], password : [email or web address]

## 2017-09-25 ENCOUNTER — Other Ambulatory Visit: Payer: Self-pay | Admitting: Family Medicine

## 2017-09-25 DIAGNOSIS — I77811 Abdominal aortic ectasia: Secondary | ICD-10-CM

## 2017-10-01 DIAGNOSIS — Z85828 Personal history of other malignant neoplasm of skin: Secondary | ICD-10-CM | POA: Diagnosis not present

## 2017-10-01 DIAGNOSIS — L570 Actinic keratosis: Secondary | ICD-10-CM | POA: Diagnosis not present

## 2017-10-01 DIAGNOSIS — C44519 Basal cell carcinoma of skin of other part of trunk: Secondary | ICD-10-CM | POA: Diagnosis not present

## 2017-10-01 DIAGNOSIS — Z08 Encounter for follow-up examination after completed treatment for malignant neoplasm: Secondary | ICD-10-CM | POA: Diagnosis not present

## 2017-10-01 DIAGNOSIS — X32XXXA Exposure to sunlight, initial encounter: Secondary | ICD-10-CM | POA: Diagnosis not present

## 2017-10-01 DIAGNOSIS — D485 Neoplasm of uncertain behavior of skin: Secondary | ICD-10-CM | POA: Diagnosis not present

## 2017-10-01 DIAGNOSIS — L821 Other seborrheic keratosis: Secondary | ICD-10-CM | POA: Diagnosis not present

## 2017-10-31 ENCOUNTER — Ambulatory Visit (INDEPENDENT_AMBULATORY_CARE_PROVIDER_SITE_OTHER): Payer: Medicare Other

## 2017-10-31 ENCOUNTER — Encounter: Payer: Self-pay | Admitting: Family Medicine

## 2017-10-31 ENCOUNTER — Ambulatory Visit (INDEPENDENT_AMBULATORY_CARE_PROVIDER_SITE_OTHER): Payer: Medicare Other | Admitting: Family Medicine

## 2017-10-31 VITALS — BP 104/70 | HR 73 | Temp 97.6°F | Resp 16 | Ht 62.0 in | Wt 105.2 lb

## 2017-10-31 DIAGNOSIS — M25532 Pain in left wrist: Secondary | ICD-10-CM | POA: Diagnosis not present

## 2017-10-31 DIAGNOSIS — M25531 Pain in right wrist: Secondary | ICD-10-CM | POA: Diagnosis not present

## 2017-10-31 DIAGNOSIS — W19XXXA Unspecified fall, initial encounter: Secondary | ICD-10-CM | POA: Insufficient documentation

## 2017-10-31 DIAGNOSIS — M25521 Pain in right elbow: Secondary | ICD-10-CM | POA: Diagnosis not present

## 2017-10-31 DIAGNOSIS — R0781 Pleurodynia: Secondary | ICD-10-CM

## 2017-10-31 DIAGNOSIS — S299XXA Unspecified injury of thorax, initial encounter: Secondary | ICD-10-CM | POA: Diagnosis not present

## 2017-10-31 DIAGNOSIS — R0789 Other chest pain: Secondary | ICD-10-CM | POA: Diagnosis not present

## 2017-10-31 DIAGNOSIS — S6991XA Unspecified injury of right wrist, hand and finger(s), initial encounter: Secondary | ICD-10-CM | POA: Diagnosis not present

## 2017-10-31 DIAGNOSIS — S6992XA Unspecified injury of left wrist, hand and finger(s), initial encounter: Secondary | ICD-10-CM | POA: Diagnosis not present

## 2017-10-31 DIAGNOSIS — S59901A Unspecified injury of right elbow, initial encounter: Secondary | ICD-10-CM | POA: Diagnosis not present

## 2017-10-31 NOTE — Assessment & Plan Note (Signed)
Patient is status post mechanical fall without loss of consciousness and no injury to her cranium.  Her most significant pain is in her left ribs and we will x-ray those.  We will x-ray her right elbow as well as her wrists given her level of tenderness in those areas.  She has a slight abrasion over her chin though no palpable defects and only minimal tenderness.  I suspect soft tissue injury in this area.  She is neurologically intact and is alert.  She had no injury to her cranium.  She will monitor for any neurological symptoms.  She will monitor for pulmonary symptoms given her rib discomfort.  She is given return precautions.

## 2017-10-31 NOTE — Progress Notes (Addendum)
Tommi Rumps, MD Phone: [phone]  Kylie May is a 84 y.o. female who presents today for same day.  CC: fall  She reports last night she was outside walking her dog when she caught an edge on her concrete sidewalk and fell onto her right elbow, left ribs, and grazed her chin on the ground.  She noted no impact to her cranium.  She has noted some wrist pain bilaterally as well as elbow pain and abrasions over those areas.  She has a small abrasion over her chin with no discomfort.  She notes most of her discomfort is at her left ribs and particularly bothers her when she takes a deep breath.  She has had no headaches, vision changes, numbness, weakness, or shortness of breath.  She had no loss of consciousness.  She has not taken any medication for this.  Social History   Tobacco Use  Smoking Status Former Smoker  . Last attempt to quit: 06/04/1951  . Years since quitting: 66.4  Smokeless Tobacco Never Used  Tobacco Comment   Smoked 1945-1953 , up to 2 cigarettes/day     ROS see history of present illness  Objective  Physical Exam Vitals:   10/31/17 1410  BP: 104/70  Pulse: 73  Resp: 16  Temp: 97.6 F (36.4 C)  SpO2: 98%    BP Readings from Last 3 Encounters:  10/31/17 104/70  09/23/17 118/70  09/02/17 120/66   Wt Readings from Last 3 Encounters:  10/31/17 105 lb 4 oz (47.7 kg)  09/23/17 103 lb 8 oz (46.9 kg)  09/02/17 104 lb (47.2 kg)    Physical Exam  Constitutional: No distress.  Cardiovascular: Normal rate, regular rhythm and normal heart sounds.  Pulmonary/Chest: Effort normal and breath sounds normal.  Musculoskeletal: She exhibits no edema.  Anterior lower and mid rib tenderness on the left side, no flail chest, no palpable bony defects, bony tenderness over the right olecranon process with overlying abrasion, no swelling, bilateral wrist with slight tenderness over the volar aspect ulnar side near the pisiform with no bony defects, slight  abrasions in these areas, no anatomic snuffbox tenderness, no other wrist tenderness bilaterally, slight abrasion over her chin, only minimal tenderness, no bony defects palpated, she is able to open and close her mouth easily with no pain, slight abrasions on several of her fingers though no bony tenderness or bony defects in her hands  Neurological: She is alert.  CN 2-12 intact, 5/5 strength in bilateral biceps, triceps, grip, quads, hamstrings, plantar and dorsiflexion, sensation to light touch intact in bilateral UE and LE, normal gait  Skin: Skin is warm and dry. She is not diaphoretic.     Assessment/Plan: Please see individual problem list.  Fall Patient is status post mechanical fall without loss of consciousness and no injury to her cranium.  Her most significant pain is in her left ribs and we will x-ray those.  We will x-ray her right elbow as well as her wrists given her level of tenderness in those areas.  She has a slight abrasion over her chin though no palpable defects and only minimal tenderness.  I suspect soft tissue injury in this area.  She is neurologically intact and is alert.  She had no injury to her cranium.  She will monitor for any neurological symptoms.  She will monitor for pulmonary symptoms given her rib discomfort.  She is given return precautions.   Orders Placed This Encounter  Procedures  . DG Ribs  Unilateral Left    Standing Status:   Future    Number of Occurrences:   1    Standing Expiration Date:   01/01/2019    Order Specific Question:   Reason for Exam (SYMPTOM  OR DIAGNOSIS REQUIRED)    Answer:   fall on to left ribs, pain, no palpable defect    Order Specific Question:   Preferred imaging location?    Answer:   [facility] Specific Question:   Radiology Contrast Protocol - do NOT remove file path    Answer:   \\[email or web address]s.[email or web address]  . DG Wrist Complete Right    Standing Status:   Future      Number of Occurrences:   1    Standing Expiration Date:   01/01/2019    Order Specific Question:   Reason for Exam (SYMPTOM  OR DIAGNOSIS REQUIRED)    Answer:   right wrist pain s/p fall on to wrist    Order Specific Question:   Preferred imaging location?    Answer:   [facility] Specific Question:   Radiology Contrast Protocol - do NOT remove file path    Answer:   \\[email or web address]s.[email or web address]  . DG Wrist Complete Left    Standing Status:   Future    Number of Occurrences:   1    Standing Expiration Date:   01/01/2019    Order Specific Question:   Reason for Exam (SYMPTOM  OR DIAGNOSIS REQUIRED)    Answer:   left wrist pain s/p fall on to wrist    Order Specific Question:   Preferred imaging location?    Answer:   [facility] Specific Question:   Radiology Contrast Protocol - do NOT remove file path    Answer:   \\[email or web address]s.[email or web address]  . DG Elbow Complete Right    Standing Status:   Future    Number of Occurrences:   1    Standing Expiration Date:   01/01/2019    Order Specific Question:   Reason for Exam (SYMPTOM  OR DIAGNOSIS REQUIRED)    Answer:   right elbow pain after falling    Order Specific Question:   Preferred imaging location?    Answer:   [facility] Specific Question:   Radiology Contrast Protocol - do NOT remove file path    Answer:   \\[email or web address]\[email or web address]s.[email or web address]    No orders of the defined types were placed in this encounter.    Tommi Rumps, MD [place]

## 2017-10-31 NOTE — Patient Instructions (Signed)
Nice to see you. We will obtain imaging to rule out broken bones in your right elbow, right hand, right wrist, left hand, left wrist, and left ribs. If you develop trouble breathing, cough productive of blood, fevers, vision changes, numbness, weakness, headaches, or any new or changing symptoms please seek medical attention immediately.

## 2017-10-31 NOTE — Progress Notes (Signed)
Pre-visit discussion using our clinic review tool. No additional management support is needed unless otherwise documented below in the visit note.  

## 2017-11-03 ENCOUNTER — Telehealth: Payer: Self-pay | Admitting: Family Medicine

## 2017-11-05 ENCOUNTER — Encounter: Payer: Self-pay | Admitting: Family Medicine

## 2017-11-05 ENCOUNTER — Ambulatory Visit (INDEPENDENT_AMBULATORY_CARE_PROVIDER_SITE_OTHER): Payer: Medicare Other | Admitting: Family Medicine

## 2017-11-05 VITALS — BP 118/60 | HR 78 | Temp 98.2°F | Ht 62.0 in | Wt 105.8 lb

## 2017-11-05 DIAGNOSIS — E7800 Pure hypercholesterolemia, unspecified: Secondary | ICD-10-CM

## 2017-11-05 DIAGNOSIS — R634 Abnormal weight loss: Secondary | ICD-10-CM | POA: Diagnosis not present

## 2017-11-05 DIAGNOSIS — S0993XA Unspecified injury of face, initial encounter: Secondary | ICD-10-CM | POA: Diagnosis not present

## 2017-11-05 DIAGNOSIS — W19XXXA Unspecified fall, initial encounter: Secondary | ICD-10-CM | POA: Diagnosis not present

## 2017-11-05 NOTE — Patient Instructions (Signed)
Nice to see you. Please monitor your wrist and elbow and if you have recurrent pain you should see orthopedics.  Also monitor your knee and if it bothers you or does not continue to improve please let us know so we can x-ray. Please call your dentist to set up an appointment to evaluate your broken tooth.

## 2017-11-05 NOTE — Assessment & Plan Note (Signed)
Tolerating Crestor.  Plan on lipid panel at next visit.

## 2017-11-05 NOTE — Progress Notes (Signed)
Tommi Rumps, MD Phone: [phone]  Kylie May is a 84 y.o. female who presents today for f/u.  CC: hld, fall  HYPERLIPIDEMIA Symptoms Chest pain on exertion:  no    Medications: Compliance- taking crestor Right upper quadrant pain- no  Muscle aches- no  Patient has done well since her fall last week.  She did have an x-ray of her elbow and bilateral wrists as well as her ribs.  Possible avulsion fracture at the right ulnar styloid process and possible avulsion fracture near her olecranon process of her right elbow.  She notes her daughter did relay the information about seeing orthopedics though the patient wonders if she really needs to.  She notes no significant pain.  She has not been taking any medication for this.  She also notes her right knee bothers her some over the kneecap.  Patient also asks me to look inside of her mouth at her right lower posterior molar as she wonders if it has been broken.  Her weight has trended up.  She is trying to eat better.  She has 3 good meals a day.  No night sweats.  Prior CT abdomen and pelvis with no cause for weight loss.  Social History   Tobacco Use  Smoking Status Former Smoker  . Last attempt to quit: 06/04/1951  . Years since quitting: 66.4  Smokeless Tobacco Never Used  Tobacco Comment   Smoked 1945-1953 , up to 2 cigarettes/day     ROS see history of present illness  Objective  Physical Exam Vitals:   11/05/17 1337  BP: 118/60  Pulse: 78  Temp: 98.2 F (36.8 C)  SpO2: 98%    BP Readings from Last 3 Encounters:  11/05/17 118/60  10/31/17 104/70  09/23/17 118/70   Wt Readings from Last 3 Encounters:  11/05/17 105 lb 12.8 oz (48 kg)  10/31/17 105 lb 4 oz (47.7 kg)  09/23/17 103 lb 8 oz (46.9 kg)    Physical Exam  Constitutional: No distress.  HENT:  Right lower posterior molar with small piece of tooth missing, no gingival swelling or drainage  Cardiovascular: Normal rate, regular rhythm and  normal heart sounds.  Pulmonary/Chest: Effort normal and breath sounds normal.  Musculoskeletal: She exhibits no edema.  Patient is not tender over the right ulnar styloid process, she is not tender over over the right wrist, right elbow with no tenderness over the olecranon process or other tenderness, there is a small scab near the right olecranon process, right knee with small abrasion over right kneecap, slight tenderness in this area, no swelling, no bony defects, 2+ radial pulse on the right  Neurological: She is alert.  Skin: Skin is warm and dry. She is not diaphoretic.     Assessment/Plan: Please see individual problem list.  Fall Prior x-rays with sequelae of avulsion fractures.  She is not tender today.  These findings could be old given lack of tenderness today although they could be new.  I recommended orthopedic evaluation though she wanted to defer this given that it is not bothering her much.  If she decides that she wants to see orthopedics she will let us know where she can go [facility].  I offered an x-ray of her right knee given her tenderness though she deferred that as well.  If not improving she will let us know.  Hyperlipidemia Tolerating Crestor.  Plan on lipid panel at next visit.  Weight loss Weight has continued to  trend up.  She will continue to eat well.  Prior evaluation unremarkable for cause.  She will continue to monitor.  Tooth injury  apparent broken tooth.  She will contact her dentist for evaluation.   No orders of the defined types were placed in this encounter.   No orders of the defined types were placed in this encounter.    Tommi Rumps, MD [place]

## 2017-11-05 NOTE — Assessment & Plan Note (Signed)
Prior x-rays with sequelae of avulsion fractures.  She is not tender today.  These findings could be old given lack of tenderness today although they could be new.  I recommended orthopedic evaluation though she wanted to defer this given that it is not bothering her much.  If she decides that she wants to see orthopedics she will let us know where she can go to [facility].  I offered an x-ray of her right knee given her tenderness though she deferred that as well.  If not improving she will let us know.

## 2017-11-05 NOTE — Assessment & Plan Note (Signed)
apparent broken tooth.  She will contact her dentist for evaluation.

## 2017-11-05 NOTE — Telephone Encounter (Signed)
error 

## 2017-11-05 NOTE — Assessment & Plan Note (Addendum)
Weight has continued to trend up.  She will continue to eat well.  Prior evaluation unremarkable for cause.  She will continue to monitor.

## 2017-11-26 ENCOUNTER — Other Ambulatory Visit: Payer: Self-pay | Admitting: Gastroenterology

## 2017-11-26 DIAGNOSIS — C44519 Basal cell carcinoma of skin of other part of trunk: Secondary | ICD-10-CM | POA: Diagnosis not present

## 2017-12-03 ENCOUNTER — Telehealth: Payer: Self-pay | Admitting: *Deleted

## 2017-12-03 NOTE — Telephone Encounter (Signed)
Copied from [place] [phone]. Topic: General - Other >> Dec 03, 2017  4:07 PM Mcneil, Ja-Kwan wrote: Reason for CRM: Pt daughter Butch Penny states pt has been experiencing stomach problems and after research they are wondering if the pt can stop taking the Magnesium. Butch Penny requests a return call. Cb# [phone].

## 2017-12-03 NOTE — Telephone Encounter (Signed)
Patient's daughter said the patient frequently makes a mess on the toilet seat and on her clothes. It comes on really fast and sometimes can't make it to the bathroom.

## 2017-12-03 NOTE — Telephone Encounter (Signed)
I would need to know what stomach issues she has been having.  She could certainly discontinue the medication if that is potentially causing stomach upset.  We could check a magnesium level about a week after her discontinuing the medication.

## 2017-12-03 NOTE — Telephone Encounter (Signed)
Not able to reach patient daughter to find out what type of stomach issues patient is having, Patient was prescribed magnesium oxide in 2016 for Magnesium level of 1.1 , further labs for magnesium with in normal range patient asking if she can stop magnesium.

## 2017-12-03 NOTE — Telephone Encounter (Signed)
Copied from [place] [phone]. Topic: General - Other >> Dec 03, 2017  4:07 PM Mcneil, Ja-Kwan wrote: Reason for CRM: Pt daughter Butch Penny states pt has been experiencing stomach problems and after research they are wondering if the pt can stop taking the Magnesium. Butch Penny requests a return call. Cb# [phone].

## 2017-12-05 NOTE — Telephone Encounter (Signed)
Patient daughter DPR notified they will discuss and decide.

## 2017-12-05 NOTE — Telephone Encounter (Signed)
Pt called back but could not understand me and hung up  Please call her back

## 2017-12-05 NOTE — Telephone Encounter (Signed)
She can stop the magnesium and see if the diarrhea improves. If it is not improving she should be evaluated. If she is having any stomach pain or fevers or blood in her stool she needs to be evaluated immediately.

## 2017-12-05 NOTE — Telephone Encounter (Signed)
Patient is having diarrhea and has experienced incontinence of bowel and it is explosive, patient cannot go out to dinner this happens often with family members . Daughter does not want patient going through any more testing but would like to see maybe a different type of magnesium that is not so harsh on the stomach. I ask daughter to make sure it is magnesium oxide patient is taking OTC not magnesium citrate.

## 2017-12-16 NOTE — Telephone Encounter (Signed)
Pt was in office and thought she need to have labs done, no orders for labs. If pt needs labs please call her. She said don't call her if she doesn't need labs. If so please call her not her daughter.

## 2017-12-16 NOTE — Telephone Encounter (Signed)
Patient is scheduled and daughter was notified

## 2017-12-16 NOTE — Telephone Encounter (Signed)
Please advise 

## 2017-12-16 NOTE — Telephone Encounter (Signed)
Patient should have her magnesium checked.  She was supposed to stop her magnesium oxide and if she did that she needs the labs.  Order has been placed.

## 2017-12-16 NOTE — Addendum Note (Signed)
Addended by: [name] on: 12/16/2017 02:13 PM   Modules accepted: Orders

## 2017-12-17 ENCOUNTER — Other Ambulatory Visit (INDEPENDENT_AMBULATORY_CARE_PROVIDER_SITE_OTHER): Payer: Medicare Other

## 2017-12-17 LAB — MAGNESIUM: MAGNESIUM: 1.1 mg/dL — AB (ref 1.5–2.5)

## 2017-12-18 ENCOUNTER — Ambulatory Visit (INDEPENDENT_AMBULATORY_CARE_PROVIDER_SITE_OTHER): Payer: Medicare Other | Admitting: Family Medicine

## 2017-12-18 ENCOUNTER — Telehealth: Payer: Self-pay | Admitting: Cardiovascular Disease

## 2017-12-18 ENCOUNTER — Other Ambulatory Visit: Payer: Self-pay | Admitting: Family Medicine

## 2017-12-18 ENCOUNTER — Ambulatory Visit (INDEPENDENT_AMBULATORY_CARE_PROVIDER_SITE_OTHER): Payer: Medicare Other

## 2017-12-18 VITALS — BP 114/76 | HR 91 | Temp 97.8°F | Resp 18 | Wt 104.0 lb

## 2017-12-18 DIAGNOSIS — M79672 Pain in left foot: Secondary | ICD-10-CM | POA: Diagnosis not present

## 2017-12-18 DIAGNOSIS — M19072 Primary osteoarthritis, left ankle and foot: Secondary | ICD-10-CM | POA: Diagnosis not present

## 2017-12-18 MED ORDER — MAGNESIUM CHLORIDE 64 MG PO TBEC: 2.0000 | DELAYED_RELEASE_TABLET | Freq: Every day | ORAL | 3 refills | Status: AC

## 2017-12-18 NOTE — Telephone Encounter (Signed)
Spoke with patients daughter per release form and reviewed symptoms with her. She denies any redness, states that it is just uncomfortable from being swollen. No swelling noted in the other leg, no shortness of breath, and she reports that patient has terrible eating habits. Reviewed signs and symptoms to monitor for that would require further evaluation. Instructed her to have patient wear compression hose/socks and to elevate feet when possible. Reviewed various strengths they come in, measurements needed, and various stores that usually carry them. She did mention they were heading to the beach and advised that she should try to elevate feet and/or stop every hour to ambulate and exercise to try and reduce additional swelling. She verbalized understanding of our conversation, agreement with plan, and had no further questions at this time.

## 2017-12-18 NOTE — Progress Notes (Signed)
Subjective:    Patient ID: Kylie May, female    DOB: Apr 09, 1922, 84 y.o.   MRN: [id number]  HPI  Kylie May is a 84 year old female who is HOH presents today with a  Left foot pain and swelling that started 2 days ago. Information is challenging to obtain as patient is very HOH and presents alone at this appointment.  She states that she is here today to get "checked out" before going to the beach with her family.  Pain is noted as being "uncomfortable" She reports that she cannot quantify the pain. She states associated soreness with palpation. No history of trauma or new footwear. She notes that swelling has improved today with elevation. Standing aggravates swelling and discomfort. She denies fever, chills, sweats, erythema, warmth, SOB, chest pain, palpitations. Review of telephone note to cardiology today indicates that her daughter stated that no erythema has been present and patient stated that foot was "uncomfortable" from being swollen. Per note, daughter stated that patient has terrible "eating habits" She was advised by cardiology to wear compression hose/socks and elevate feet when possible.   She has experienced left ankle pain previously on 07/16/17 and noted lateal malleolus discomfort only at night. No history of injury. X-ray of left ankle on 07/16/17 noted mild soft tissue swelling of the lateral malleolus that reflected stigmata of a soft tissue injury or inflammation.   Review of Systems  Constitutional: Negative for chills, fatigue and fever.  Respiratory: Negative for cough, shortness of breath and wheezing.   Cardiovascular: Negative for chest pain, palpitations and leg swelling.  Gastrointestinal: Negative for abdominal pain.  Musculoskeletal:       Left foot pain  Skin: Negative for rash.   Past Medical History:  Diagnosis Date  . A-fib ([place])   . Acid reflux disease   . Arrhythmia   . Chronic diastolic CHF (congestive heart failure) ([place])   .  Compression fracture of lumbar vertebra ([place])   . Diverticulitis 01-2008   GI [place]  . Diverticulosis   . Esophageal stricture 2006  . Femoral bruit    Right  . Fibrocystic breast disease   . Hiatal hernia   . Hypercholesterolemia    [place] study LDL goal = < 160  . Mitral regurgitation    a. 06/2014 EF 55-60%, elevated end-diastolic pressures, dilated LA at 4.3 cm, mildly dilated RA, severe mitral regurgitation, mild aortic sclerosis without stenosis, mod-severe TR  . Mitral valve prolapse   . Osteoporosis    Dr Matthew Saras  . Pancreatitis 07-2008   Hospitalized   . Schatzki's ring   . Skin cancer    facial x 2. [name]     Social History   Socioeconomic History  . Marital status: Widowed    Spouse name: Not on file  . Number of children: 3  . Years of education: Not on file  . Highest education level: Not on file  Occupational History  . 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 Financial resource strain: Not on file  . Food insecurity:    Worry: Not on file    Inability: Not on file  . Transportation needs:    Medical: Not on file    Non-medical: Not on file  Tobacco Use  . Smoking status: Former Smoker    Last attempt to quit: 06/04/1951    Years since quitting: 66.5  . Smokeless tobacco: Never Used  .  Tobacco comment: Smoked 1945-1953 , up to 2 cigarettes/day  Substance and Sexual Activity  . Alcohol use: Yes    Alcohol/week: 4.2 oz    Types: 7 Glasses of wine per week  . Drug use: No  . Sexual activity: Not Currently  Lifestyle  . Physical activity:    Days per week: Not on file    Minutes per session: Not on file  . Stress: Not on file  Relationships  . Social connections:    Talks on phone: Not on file    Gets together: Not on file    Attends religious service: Not on file    Active member of club or organization: Not on file    Attends meetings of clubs or organizations: Not  on file    Relationship status: Not on file  . Intimate partner violence:    Fear of current or ex partner: Not on file    Emotionally abused: Not on file    Physically abused: Not on file    Forced sexual activity: Not on file  Other Topics Concern  . Not on file  Social History Narrative   Has living will    Would want daughter Kylie May to be health care POA   Not sure about DNR--definitely doesn't want prolonged life support   Not sure about tube feeds either (but probably not)          Past Surgical History:  Procedure Laterality Date  . COLONOSCOPY  2006  . Epidural steroids     x1  . ESOPHAGEAL DILATION  2006  . G3 P4 (twins)    . SKIN CANCER EXCISION      Family History  Problem Relation Age of Onset  . Breast cancer Maternal Aunt   . Hepatitis Daughter        C  . Lung disease Mother        lung tumor  . Heart attack Daughter 62       S/P stents  . Heart attack Sister 25  . Diabetes Neg Hx   . Stroke Neg Hx     Allergies  Allergen Reactions  . Metronidazole     Other reaction(s): Dizziness and giddiness (finding), Other (qualifier value) made things feel like they were vibrating like the bed and floor,also caused mild dizziness weakness  . Penicillin G Rash    Current Outpatient Medications on File Prior to Visit  Medication Sig Dispense Refill  . apixaban (ELIQUIS) 2.5 MG TABS tablet Take 1 tablet (2.5 mg total) by mouth 2 (two) times daily. 180 tablet 3  . carvedilol (COREG) 3.125 MG tablet Take 1 tablet (3.125 mg total) by mouth 2 (two) times daily with a meal. 180 tablet 3  . flecainide (TAMBOCOR) 50 MG tablet Take 1 tablet (50 mg total) by mouth 2 (two) times daily. 180 tablet 3  . hydrocortisone 2.5 % ointment     . magnesium oxide (MAG-OX) 400 MG tablet Take 1 tablet (400 mg total) by mouth daily. 30 tablet 0  . omeprazole (PRILOSEC) 40 MG capsule TAKE 1 CAPSULE DAILY 90 capsule 0  . rosuvastatin (CRESTOR) 20 MG tablet Take 1 tablet (20 mg total)  by mouth daily. 90 tablet 3   No current facility-administered medications on file prior to visit.     BP 114/76 (BP Location: Left Arm, Patient Position: Sitting, Cuff Size: Normal)   Pulse 91   Temp 97.8 F (36.6 C) (Oral)   Resp 18   Wt  104 lb (47.2 kg)   SpO2 99%   BMI 19.02 kg/m       Objective:   Physical Exam  Constitutional: She is oriented to person, place, and time.  Thin, optimally nourished. HOH; challenging to obtain information  Eyes: Pupils are equal, round, and reactive to light. No scleral icterus.  Neck: Neck supple.  Cardiovascular: Normal rate, regular rhythm and intact distal pulses.  Pulmonary/Chest: Effort normal and breath sounds normal. She has no wheezes. She has no rales.  Abdominal: Soft. Bowel sounds are normal. There is no tenderness.  Musculoskeletal: She exhibits edema.  Left 1+ pedal edema present with mild tenderness to palpation of forefoot. Crossover toe present. No erythema or warmth present. DP 2+, PT 2+  Lymphadenopathy:    She has no cervical adenopathy.  Neurological: She is alert and oriented to person, place, and time.  Skin: Skin is warm and dry. No rash noted.  Psychiatric: She has a normal mood and affect. Her behavior is normal.      Assessment & Plan:  1. Left foot pain Unclear etiology; challenging to obtain information due to [facility]; tenderness present in forefoot and with mild edema; will obtain X-ray today for further evaluation. Advised patient to elevate foot/ankle and avoid standing for extended periods of time. Advised decreased salt intake. Reinforced information from cardiology discussing taking breaks when traveling to beach; advised stopping every hour to ambulate. Review of X-ray will determine further evaluation and plan.  Patient voiced understanding and agreed with plan. Return precautions provided.   - DG Foot Complete Left; Future  Delano Metz, FNP-C

## 2017-12-18 NOTE — Telephone Encounter (Signed)
Pt daughter calling stating she saw patient yesterday  She states patient had some swelling left ankle  She is not sure how long it was there, but she was having some discomfort   Pt has been having some really bad stomach issues, and not sure if this is correlated or not  Pt is going out of town this Saturday would like to make sure everything will be okay for her to do.  Would like advise on this

## 2017-12-18 NOTE — Patient Instructions (Signed)
It was a pleasure to meet you today.  Please go to X-ray before you leave. Results of your X-ray will be called to you.  Elevate foot/ankle and avoid for extended periods of time.   Foot Pain Many things can cause foot pain. Some common causes are:  An injury.  A sprain.  Arthritis.  Blisters.  Bunions.  Follow these instructions at home: Pay attention to any changes in your symptoms. Take these actions to help with your discomfort:  If directed, put ice on the affected area: ? Put ice in a plastic bag. ? Place a towel between your skin and the bag. ? Leave the ice on for 15-20 minutes, 3?4 times a day for 2 days.  Take over-the-counter and prescription medicines only as told by your health care provider.  Wear comfortable, supportive shoes that fit you well. Do not wear high heels.  Do not stand or walk for long periods of time.  Do not lift a lot of weight. This can put added pressure on your feet.  Do stretches to relieve foot pain and stiffness as told by your health care provider.  Rub your foot gently.  Keep your feet clean and dry.  Contact a health care provider if:  Your pain does not get better after a few days of self-care.  Your pain gets worse.  You cannot stand on your foot. Get help right away if:  Your foot is numb or tingling.  Your foot or toes are swollen.  Your foot or toes turn white or blue.  You have warmth and redness along your foot. This information is not intended to replace advice given to you by your health care provider. Make sure you discuss any questions you have with your health care provider. Document Released: 06/16/2015 Document Revised: 10/26/2015 Document Reviewed: 06/15/2014 Elsevier Interactive Patient Education  [employer].

## 2017-12-19 ENCOUNTER — Ambulatory Visit: Payer: Medicare Other | Admitting: Internal Medicine

## 2017-12-19 ENCOUNTER — Telehealth: Payer: Self-pay | Admitting: Family Medicine

## 2017-12-19 ENCOUNTER — Telehealth: Payer: Self-pay

## 2017-12-19 ENCOUNTER — Encounter: Payer: Self-pay | Admitting: Family Medicine

## 2017-12-19 NOTE — Telephone Encounter (Signed)
Copied from [place] [id number]. Topic: Quick Communication - See Telephone Encounter >> Dec 19, 2017 10:27 AM Ahmed Prima L wrote: CRM for notification. See Telephone encounter for: 12/19/17.  Patient said she would like to get her xray results.  >> Dec 19, 2017  3:28 PM Oliver Pila B wrote: Call pt back to give lab/image results

## 2017-12-19 NOTE — Telephone Encounter (Signed)
Copied from [place] [id number]. Topic: Quick Communication - See Telephone Encounter >> Dec 19, 2017 10:27 AM Ahmed Prima L wrote: CRM for notification. See Telephone encounter for: 12/19/17.  Patient said she would like to get her xray results.

## 2017-12-19 NOTE — Telephone Encounter (Signed)
See result note-patient aware 

## 2018-01-06 ENCOUNTER — Other Ambulatory Visit (INDEPENDENT_AMBULATORY_CARE_PROVIDER_SITE_OTHER): Payer: Medicare Other

## 2018-01-06 LAB — MAGNESIUM: Magnesium: 2 mg/dL (ref 1.5–2.5)

## 2018-02-09 DIAGNOSIS — S82024A Nondisplaced longitudinal fracture of right patella, initial encounter for closed fracture: Secondary | ICD-10-CM | POA: Diagnosis not present

## 2018-02-09 DIAGNOSIS — S0083XA Contusion of other part of head, initial encounter: Secondary | ICD-10-CM | POA: Diagnosis not present

## 2018-02-09 DIAGNOSIS — S82001A Unspecified fracture of right patella, initial encounter for closed fracture: Secondary | ICD-10-CM | POA: Diagnosis not present

## 2018-02-09 DIAGNOSIS — M25561 Pain in right knee: Secondary | ICD-10-CM | POA: Diagnosis not present

## 2018-02-09 DIAGNOSIS — S61012A Laceration without foreign body of left thumb without damage to nail, initial encounter: Secondary | ICD-10-CM | POA: Diagnosis not present

## 2018-02-17 DIAGNOSIS — S82024A Nondisplaced longitudinal fracture of right patella, initial encounter for closed fracture: Secondary | ICD-10-CM | POA: Diagnosis not present

## 2018-02-19 DIAGNOSIS — S61012A Laceration without foreign body of left thumb without damage to nail, initial encounter: Secondary | ICD-10-CM | POA: Diagnosis not present

## 2018-02-24 ENCOUNTER — Other Ambulatory Visit: Payer: Self-pay | Admitting: Gastroenterology

## 2018-03-02 DIAGNOSIS — Z23 Encounter for immunization: Secondary | ICD-10-CM | POA: Diagnosis not present

## 2018-03-10 DIAGNOSIS — S82024A Nondisplaced longitudinal fracture of right patella, initial encounter for closed fracture: Secondary | ICD-10-CM | POA: Diagnosis not present

## 2018-03-11 DIAGNOSIS — S82021D Displaced longitudinal fracture of right patella, subsequent encounter for closed fracture with routine healing: Secondary | ICD-10-CM | POA: Diagnosis not present

## 2018-03-11 DIAGNOSIS — E785 Hyperlipidemia, unspecified: Secondary | ICD-10-CM | POA: Diagnosis not present

## 2018-03-11 DIAGNOSIS — I10 Essential (primary) hypertension: Secondary | ICD-10-CM | POA: Diagnosis not present

## 2018-03-11 DIAGNOSIS — Z9181 History of falling: Secondary | ICD-10-CM | POA: Diagnosis not present

## 2018-03-11 DIAGNOSIS — M8580 Other specified disorders of bone density and structure, unspecified site: Secondary | ICD-10-CM | POA: Diagnosis not present

## 2018-03-11 DIAGNOSIS — Z7901 Long term (current) use of anticoagulants: Secondary | ICD-10-CM | POA: Diagnosis not present

## 2018-03-11 DIAGNOSIS — M1990 Unspecified osteoarthritis, unspecified site: Secondary | ICD-10-CM | POA: Diagnosis not present

## 2018-03-11 DIAGNOSIS — K219 Gastro-esophageal reflux disease without esophagitis: Secondary | ICD-10-CM | POA: Diagnosis not present

## 2018-03-12 DIAGNOSIS — I10 Essential (primary) hypertension: Secondary | ICD-10-CM | POA: Diagnosis not present

## 2018-03-12 DIAGNOSIS — K219 Gastro-esophageal reflux disease without esophagitis: Secondary | ICD-10-CM | POA: Diagnosis not present

## 2018-03-12 DIAGNOSIS — M8580 Other specified disorders of bone density and structure, unspecified site: Secondary | ICD-10-CM | POA: Diagnosis not present

## 2018-03-12 DIAGNOSIS — S82021D Displaced longitudinal fracture of right patella, subsequent encounter for closed fracture with routine healing: Secondary | ICD-10-CM | POA: Diagnosis not present

## 2018-03-12 DIAGNOSIS — M1990 Unspecified osteoarthritis, unspecified site: Secondary | ICD-10-CM | POA: Diagnosis not present

## 2018-03-12 DIAGNOSIS — E785 Hyperlipidemia, unspecified: Secondary | ICD-10-CM | POA: Diagnosis not present

## 2018-03-17 DIAGNOSIS — E785 Hyperlipidemia, unspecified: Secondary | ICD-10-CM | POA: Diagnosis not present

## 2018-03-17 DIAGNOSIS — S82021D Displaced longitudinal fracture of right patella, subsequent encounter for closed fracture with routine healing: Secondary | ICD-10-CM | POA: Diagnosis not present

## 2018-03-17 DIAGNOSIS — M1990 Unspecified osteoarthritis, unspecified site: Secondary | ICD-10-CM | POA: Diagnosis not present

## 2018-03-17 DIAGNOSIS — K219 Gastro-esophageal reflux disease without esophagitis: Secondary | ICD-10-CM | POA: Diagnosis not present

## 2018-03-17 DIAGNOSIS — I10 Essential (primary) hypertension: Secondary | ICD-10-CM | POA: Diagnosis not present

## 2018-03-17 DIAGNOSIS — M8580 Other specified disorders of bone density and structure, unspecified site: Secondary | ICD-10-CM | POA: Diagnosis not present

## 2018-03-19 DIAGNOSIS — K219 Gastro-esophageal reflux disease without esophagitis: Secondary | ICD-10-CM | POA: Diagnosis not present

## 2018-03-19 DIAGNOSIS — M8580 Other specified disorders of bone density and structure, unspecified site: Secondary | ICD-10-CM | POA: Diagnosis not present

## 2018-03-19 DIAGNOSIS — E785 Hyperlipidemia, unspecified: Secondary | ICD-10-CM | POA: Diagnosis not present

## 2018-03-19 DIAGNOSIS — I10 Essential (primary) hypertension: Secondary | ICD-10-CM | POA: Diagnosis not present

## 2018-03-19 DIAGNOSIS — S82021D Displaced longitudinal fracture of right patella, subsequent encounter for closed fracture with routine healing: Secondary | ICD-10-CM | POA: Diagnosis not present

## 2018-03-19 DIAGNOSIS — M1990 Unspecified osteoarthritis, unspecified site: Secondary | ICD-10-CM | POA: Diagnosis not present

## 2018-03-20 DIAGNOSIS — S82021D Displaced longitudinal fracture of right patella, subsequent encounter for closed fracture with routine healing: Secondary | ICD-10-CM | POA: Diagnosis not present

## 2018-03-20 DIAGNOSIS — M1990 Unspecified osteoarthritis, unspecified site: Secondary | ICD-10-CM | POA: Diagnosis not present

## 2018-03-20 DIAGNOSIS — M8580 Other specified disorders of bone density and structure, unspecified site: Secondary | ICD-10-CM | POA: Diagnosis not present

## 2018-03-20 DIAGNOSIS — I10 Essential (primary) hypertension: Secondary | ICD-10-CM | POA: Diagnosis not present

## 2018-03-20 DIAGNOSIS — K219 Gastro-esophageal reflux disease without esophagitis: Secondary | ICD-10-CM | POA: Diagnosis not present

## 2018-03-20 DIAGNOSIS — E785 Hyperlipidemia, unspecified: Secondary | ICD-10-CM | POA: Diagnosis not present

## 2018-03-23 DIAGNOSIS — M8580 Other specified disorders of bone density and structure, unspecified site: Secondary | ICD-10-CM | POA: Diagnosis not present

## 2018-03-23 DIAGNOSIS — E785 Hyperlipidemia, unspecified: Secondary | ICD-10-CM | POA: Diagnosis not present

## 2018-03-23 DIAGNOSIS — I10 Essential (primary) hypertension: Secondary | ICD-10-CM | POA: Diagnosis not present

## 2018-03-23 DIAGNOSIS — S82021D Displaced longitudinal fracture of right patella, subsequent encounter for closed fracture with routine healing: Secondary | ICD-10-CM | POA: Diagnosis not present

## 2018-03-23 DIAGNOSIS — K219 Gastro-esophageal reflux disease without esophagitis: Secondary | ICD-10-CM | POA: Diagnosis not present

## 2018-03-23 DIAGNOSIS — M1990 Unspecified osteoarthritis, unspecified site: Secondary | ICD-10-CM | POA: Diagnosis not present

## 2018-03-24 DIAGNOSIS — K219 Gastro-esophageal reflux disease without esophagitis: Secondary | ICD-10-CM | POA: Diagnosis not present

## 2018-03-24 DIAGNOSIS — S82021D Displaced longitudinal fracture of right patella, subsequent encounter for closed fracture with routine healing: Secondary | ICD-10-CM | POA: Diagnosis not present

## 2018-03-24 DIAGNOSIS — E785 Hyperlipidemia, unspecified: Secondary | ICD-10-CM | POA: Diagnosis not present

## 2018-03-24 DIAGNOSIS — I10 Essential (primary) hypertension: Secondary | ICD-10-CM | POA: Diagnosis not present

## 2018-03-24 DIAGNOSIS — M8580 Other specified disorders of bone density and structure, unspecified site: Secondary | ICD-10-CM | POA: Diagnosis not present

## 2018-03-24 DIAGNOSIS — M1990 Unspecified osteoarthritis, unspecified site: Secondary | ICD-10-CM | POA: Diagnosis not present

## 2018-03-26 DIAGNOSIS — M1990 Unspecified osteoarthritis, unspecified site: Secondary | ICD-10-CM | POA: Diagnosis not present

## 2018-03-26 DIAGNOSIS — E785 Hyperlipidemia, unspecified: Secondary | ICD-10-CM | POA: Diagnosis not present

## 2018-03-26 DIAGNOSIS — S82021D Displaced longitudinal fracture of right patella, subsequent encounter for closed fracture with routine healing: Secondary | ICD-10-CM | POA: Diagnosis not present

## 2018-03-26 DIAGNOSIS — M8580 Other specified disorders of bone density and structure, unspecified site: Secondary | ICD-10-CM | POA: Diagnosis not present

## 2018-03-26 DIAGNOSIS — I10 Essential (primary) hypertension: Secondary | ICD-10-CM | POA: Diagnosis not present

## 2018-03-26 DIAGNOSIS — K219 Gastro-esophageal reflux disease without esophagitis: Secondary | ICD-10-CM | POA: Diagnosis not present

## 2018-03-27 ENCOUNTER — Telehealth: Payer: Self-pay | Admitting: Gastroenterology

## 2018-03-27 DIAGNOSIS — E785 Hyperlipidemia, unspecified: Secondary | ICD-10-CM | POA: Diagnosis not present

## 2018-03-27 DIAGNOSIS — M1990 Unspecified osteoarthritis, unspecified site: Secondary | ICD-10-CM | POA: Diagnosis not present

## 2018-03-27 DIAGNOSIS — K219 Gastro-esophageal reflux disease without esophagitis: Secondary | ICD-10-CM | POA: Diagnosis not present

## 2018-03-27 DIAGNOSIS — S82021D Displaced longitudinal fracture of right patella, subsequent encounter for closed fracture with routine healing: Secondary | ICD-10-CM | POA: Diagnosis not present

## 2018-03-27 DIAGNOSIS — M8580 Other specified disorders of bone density and structure, unspecified site: Secondary | ICD-10-CM | POA: Diagnosis not present

## 2018-03-27 DIAGNOSIS — I10 Essential (primary) hypertension: Secondary | ICD-10-CM | POA: Diagnosis not present

## 2018-03-27 MED ORDER — OMEPRAZOLE 40 MG PO CPDR: 40.0000 mg | DELAYED_RELEASE_CAPSULE | Freq: Every day | ORAL | 0 refills | Status: DC

## 2018-03-27 NOTE — Telephone Encounter (Signed)
Medication has been refilled and sent to pharmacy  

## 2018-03-27 NOTE — Telephone Encounter (Signed)
[name] called (patient's daughter) asking for a refill for Omeprazole 40mg   [facility] They have faxed over  request for refilles,but it could have been during the time our fax was down. [facility] phone # [phone]. [name] can be contacted @ [phone].

## 2018-03-30 DIAGNOSIS — D485 Neoplasm of uncertain behavior of skin: Secondary | ICD-10-CM | POA: Diagnosis not present

## 2018-03-30 DIAGNOSIS — L821 Other seborrheic keratosis: Secondary | ICD-10-CM | POA: Diagnosis not present

## 2018-03-30 DIAGNOSIS — L570 Actinic keratosis: Secondary | ICD-10-CM | POA: Diagnosis not present

## 2018-03-30 DIAGNOSIS — C44329 Squamous cell carcinoma of skin of other parts of face: Secondary | ICD-10-CM | POA: Diagnosis not present

## 2018-03-30 DIAGNOSIS — Z08 Encounter for follow-up examination after completed treatment for malignant neoplasm: Secondary | ICD-10-CM | POA: Diagnosis not present

## 2018-03-30 DIAGNOSIS — X32XXXA Exposure to sunlight, initial encounter: Secondary | ICD-10-CM | POA: Diagnosis not present

## 2018-03-30 DIAGNOSIS — Z85828 Personal history of other malignant neoplasm of skin: Secondary | ICD-10-CM | POA: Diagnosis not present

## 2018-03-30 DIAGNOSIS — R208 Other disturbances of skin sensation: Secondary | ICD-10-CM | POA: Diagnosis not present

## 2018-03-31 DIAGNOSIS — M1990 Unspecified osteoarthritis, unspecified site: Secondary | ICD-10-CM | POA: Diagnosis not present

## 2018-03-31 DIAGNOSIS — I10 Essential (primary) hypertension: Secondary | ICD-10-CM | POA: Diagnosis not present

## 2018-03-31 DIAGNOSIS — S82021D Displaced longitudinal fracture of right patella, subsequent encounter for closed fracture with routine healing: Secondary | ICD-10-CM | POA: Diagnosis not present

## 2018-03-31 DIAGNOSIS — M8580 Other specified disorders of bone density and structure, unspecified site: Secondary | ICD-10-CM | POA: Diagnosis not present

## 2018-03-31 DIAGNOSIS — E785 Hyperlipidemia, unspecified: Secondary | ICD-10-CM | POA: Diagnosis not present

## 2018-03-31 DIAGNOSIS — K219 Gastro-esophageal reflux disease without esophagitis: Secondary | ICD-10-CM | POA: Diagnosis not present

## 2018-04-01 DIAGNOSIS — I10 Essential (primary) hypertension: Secondary | ICD-10-CM | POA: Diagnosis not present

## 2018-04-01 DIAGNOSIS — M8580 Other specified disorders of bone density and structure, unspecified site: Secondary | ICD-10-CM | POA: Diagnosis not present

## 2018-04-01 DIAGNOSIS — K219 Gastro-esophageal reflux disease without esophagitis: Secondary | ICD-10-CM | POA: Diagnosis not present

## 2018-04-01 DIAGNOSIS — E785 Hyperlipidemia, unspecified: Secondary | ICD-10-CM | POA: Diagnosis not present

## 2018-04-01 DIAGNOSIS — M1990 Unspecified osteoarthritis, unspecified site: Secondary | ICD-10-CM | POA: Diagnosis not present

## 2018-04-01 DIAGNOSIS — S82021D Displaced longitudinal fracture of right patella, subsequent encounter for closed fracture with routine healing: Secondary | ICD-10-CM | POA: Diagnosis not present

## 2018-04-02 DIAGNOSIS — I10 Essential (primary) hypertension: Secondary | ICD-10-CM | POA: Diagnosis not present

## 2018-04-02 DIAGNOSIS — E785 Hyperlipidemia, unspecified: Secondary | ICD-10-CM | POA: Diagnosis not present

## 2018-04-02 DIAGNOSIS — M1990 Unspecified osteoarthritis, unspecified site: Secondary | ICD-10-CM | POA: Diagnosis not present

## 2018-04-02 DIAGNOSIS — M8580 Other specified disorders of bone density and structure, unspecified site: Secondary | ICD-10-CM | POA: Diagnosis not present

## 2018-04-02 DIAGNOSIS — S82021D Displaced longitudinal fracture of right patella, subsequent encounter for closed fracture with routine healing: Secondary | ICD-10-CM | POA: Diagnosis not present

## 2018-04-02 DIAGNOSIS — K219 Gastro-esophageal reflux disease without esophagitis: Secondary | ICD-10-CM | POA: Diagnosis not present

## 2018-04-08 DIAGNOSIS — K219 Gastro-esophageal reflux disease without esophagitis: Secondary | ICD-10-CM | POA: Diagnosis not present

## 2018-04-08 DIAGNOSIS — M1990 Unspecified osteoarthritis, unspecified site: Secondary | ICD-10-CM | POA: Diagnosis not present

## 2018-04-08 DIAGNOSIS — M8580 Other specified disorders of bone density and structure, unspecified site: Secondary | ICD-10-CM | POA: Diagnosis not present

## 2018-04-08 DIAGNOSIS — E785 Hyperlipidemia, unspecified: Secondary | ICD-10-CM | POA: Diagnosis not present

## 2018-04-08 DIAGNOSIS — I10 Essential (primary) hypertension: Secondary | ICD-10-CM | POA: Diagnosis not present

## 2018-04-08 DIAGNOSIS — S82021D Displaced longitudinal fracture of right patella, subsequent encounter for closed fracture with routine healing: Secondary | ICD-10-CM | POA: Diagnosis not present

## 2018-04-10 DIAGNOSIS — S82021D Displaced longitudinal fracture of right patella, subsequent encounter for closed fracture with routine healing: Secondary | ICD-10-CM | POA: Diagnosis not present

## 2018-04-10 DIAGNOSIS — I10 Essential (primary) hypertension: Secondary | ICD-10-CM | POA: Diagnosis not present

## 2018-04-10 DIAGNOSIS — E785 Hyperlipidemia, unspecified: Secondary | ICD-10-CM | POA: Diagnosis not present

## 2018-04-10 DIAGNOSIS — M8580 Other specified disorders of bone density and structure, unspecified site: Secondary | ICD-10-CM | POA: Diagnosis not present

## 2018-04-10 DIAGNOSIS — K219 Gastro-esophageal reflux disease without esophagitis: Secondary | ICD-10-CM | POA: Diagnosis not present

## 2018-04-10 DIAGNOSIS — M1990 Unspecified osteoarthritis, unspecified site: Secondary | ICD-10-CM | POA: Diagnosis not present

## 2018-04-13 DIAGNOSIS — I10 Essential (primary) hypertension: Secondary | ICD-10-CM | POA: Diagnosis not present

## 2018-04-13 DIAGNOSIS — M1990 Unspecified osteoarthritis, unspecified site: Secondary | ICD-10-CM | POA: Diagnosis not present

## 2018-04-13 DIAGNOSIS — K219 Gastro-esophageal reflux disease without esophagitis: Secondary | ICD-10-CM | POA: Diagnosis not present

## 2018-04-13 DIAGNOSIS — M8580 Other specified disorders of bone density and structure, unspecified site: Secondary | ICD-10-CM | POA: Diagnosis not present

## 2018-04-13 DIAGNOSIS — E785 Hyperlipidemia, unspecified: Secondary | ICD-10-CM | POA: Diagnosis not present

## 2018-04-13 DIAGNOSIS — S82021D Displaced longitudinal fracture of right patella, subsequent encounter for closed fracture with routine healing: Secondary | ICD-10-CM | POA: Diagnosis not present

## 2018-04-15 DIAGNOSIS — K219 Gastro-esophageal reflux disease without esophagitis: Secondary | ICD-10-CM | POA: Diagnosis not present

## 2018-04-15 DIAGNOSIS — M1990 Unspecified osteoarthritis, unspecified site: Secondary | ICD-10-CM | POA: Diagnosis not present

## 2018-04-15 DIAGNOSIS — E785 Hyperlipidemia, unspecified: Secondary | ICD-10-CM | POA: Diagnosis not present

## 2018-04-15 DIAGNOSIS — S82021D Displaced longitudinal fracture of right patella, subsequent encounter for closed fracture with routine healing: Secondary | ICD-10-CM | POA: Diagnosis not present

## 2018-04-15 DIAGNOSIS — M8580 Other specified disorders of bone density and structure, unspecified site: Secondary | ICD-10-CM | POA: Diagnosis not present

## 2018-04-15 DIAGNOSIS — I10 Essential (primary) hypertension: Secondary | ICD-10-CM | POA: Diagnosis not present

## 2018-04-21 DIAGNOSIS — S82001D Unspecified fracture of right patella, subsequent encounter for closed fracture with routine healing: Secondary | ICD-10-CM | POA: Diagnosis not present

## 2018-05-13 ENCOUNTER — Encounter: Payer: Self-pay | Admitting: Family Medicine

## 2018-05-13 ENCOUNTER — Ambulatory Visit (INDEPENDENT_AMBULATORY_CARE_PROVIDER_SITE_OTHER): Payer: Medicare Other | Admitting: Family Medicine

## 2018-05-13 DIAGNOSIS — K219 Gastro-esophageal reflux disease without esophagitis: Secondary | ICD-10-CM | POA: Diagnosis not present

## 2018-05-13 DIAGNOSIS — H9193 Unspecified hearing loss, bilateral: Secondary | ICD-10-CM | POA: Diagnosis not present

## 2018-05-13 DIAGNOSIS — I480 Paroxysmal atrial fibrillation: Secondary | ICD-10-CM | POA: Diagnosis not present

## 2018-05-13 DIAGNOSIS — C44529 Squamous cell carcinoma of skin of other part of trunk: Secondary | ICD-10-CM | POA: Diagnosis not present

## 2018-05-13 DIAGNOSIS — S82009A Unspecified fracture of unspecified patella, initial encounter for closed fracture: Secondary | ICD-10-CM

## 2018-05-13 HISTORY — DX: Unspecified fracture of unspecified patella, initial encounter for closed fracture: S82.009A

## 2018-05-13 MED ORDER — OMEPRAZOLE 40 MG PO CPDR: 40.0000 mg | DELAYED_RELEASE_CAPSULE | Freq: Every day | ORAL | 1 refills | Status: DC

## 2018-05-13 NOTE — Assessment & Plan Note (Addendum)
Sinus rhythm today.  She will continue to see cardiology.

## 2018-05-13 NOTE — Assessment & Plan Note (Signed)
Asymptomatic.  She will monitor.  Continue Prilosec.

## 2018-05-13 NOTE — Progress Notes (Signed)
  Tommi Rumps, MD Phone: [phone]  Kylie May is a 84 y.o. female who presents today for f/u.  CC: A. fib, throat tickle, GERD, patellar fracture  A. fib: She follows with cardiology.  She is taking her Eliquis, carvedilol, and flecainide.  No palpitations, chest pain, or bleeding issues.  Throat tickle: Patient notes this occurs intermittently and has been going on for a long time.  Once in a while she will feel a tickle in her throat and cough and it will go away.  No significant congestion or postnasal drip.  No rhinorrhea.  GERD: She is taking Prilosec.  She notes her reflux symptoms, abdominal pain, dysphagia, or blood in her stool.  Patella fracture: She followed with orthopedics and she reports she had to use a walker and a brace.  She reports this healed and she was discharged from their care.  Social History   Tobacco Use  Smoking Status Former Smoker  . Last attempt to quit: 06/04/1951  . Years since quitting: 66.9  Smokeless Tobacco Never Used  Tobacco Comment   Smoked 1945-1953 , up to 2 cigarettes/day     ROS see history of present illness  Objective  Physical Exam Vitals:   05/13/18 1031  BP: 130/66  Pulse: 68  Temp: 98.2 F (36.8 C)  SpO2: 99%    BP Readings from Last 3 Encounters:  05/13/18 130/66  12/18/17 114/76  11/05/17 118/60   Wt Readings from Last 3 Encounters:  05/13/18 105 lb 6.4 oz (47.8 kg)  12/18/17 104 lb (47.2 kg)  11/05/17 105 lb 12.8 oz (48 kg)    Physical Exam  Constitutional: No distress.  HENT:  Mouth/Throat: Oropharynx is clear and moist.  Cardiovascular: Normal rate, regular rhythm and normal heart sounds.  Pulmonary/Chest: Effort normal and breath sounds normal.  Musculoskeletal: She exhibits no edema.  Right patella with no tenderness  Neurological: She is alert.  Skin: Skin is warm and dry. She is not diaphoretic.     Assessment/Plan: Please see individual problem list.  A-fib Sinus rhythm today.   She will continue to see cardiology.  GERD (gastroesophageal reflux disease) Asymptomatic.  She will monitor.  Continue Prilosec.  Hearing loss Patient with significant issues with hearing.  She reports she was just evaluated for this and was advised there was nothing new that could be done for her hearing.  She does wear hearing aids.  Patella fracture Patient has been evaluated by orthopedics.  She reports she was released.  Throat tickle: This could be reflux related or allergy related.  It does not occur frequently.  She will monitor.  No orders of the defined types were placed in this encounter.   Meds ordered this encounter  Medications  . omeprazole (PRILOSEC) 40 MG capsule    Sig: Take 1 capsule (40 mg total) by mouth daily.    Dispense:  90 capsule    Refill:  1     Tommi Rumps, MD [place]

## 2018-05-13 NOTE — Assessment & Plan Note (Signed)
Patient with significant issues with hearing.  She reports she was just evaluated for this and was advised there was nothing new that could be done for her hearing.  She does wear hearing aids.

## 2018-05-13 NOTE — Assessment & Plan Note (Signed)
Patient has been evaluated by orthopedics.  She reports she was released.

## 2018-05-13 NOTE — Patient Instructions (Signed)
Nice to see you. I refilled your Prilosec.

## 2018-06-02 ENCOUNTER — Ambulatory Visit: Payer: Medicare Other

## 2018-06-11 ENCOUNTER — Ambulatory Visit (INDEPENDENT_AMBULATORY_CARE_PROVIDER_SITE_OTHER): Payer: Medicare Other

## 2018-06-11 VITALS — BP 120/64 | HR 73 | Temp 97.5°F | Resp 15 | Ht 58.5 in | Wt 104.0 lb

## 2018-06-11 DIAGNOSIS — Z0000 Encounter for general adult medical examination without abnormal findings: Secondary | ICD-10-CM

## 2018-06-11 NOTE — Progress Notes (Signed)
Subjective:   Kylie May is a 85 y.o. female who presents for Medicare Annual (Subsequent) preventive examination.  Review of Systems:  No ROS.  Medicare Wellness Visit. Additional risk factors are reflected in the social history. Cardiac Risk Factors include: advanced age (>68men, >57 women)     Objective:     Vitals: BP 120/64 (BP Location: Left Arm, Patient Position: Sitting, Cuff Size: Normal)   Pulse 73   Temp (!) 97.5 F (36.4 C) (Oral)   Resp 15   Ht 4' 10.5" (1.486 m)   Wt 104 lb (47.2 kg)   SpO2 95%   BMI 21.37 kg/m   Body mass index is 21.37 kg/m.  Advanced Directives 06/11/2018 05/30/2017 05/30/2016  Does Patient Have a Medical Advance Directive? Yes Yes Yes  Type of Advance Directive Living will;Healthcare Power of [place];Living will [place];Living will  Does patient want to make changes to medical advance directive? No - Patient declined No - Patient declined No - Patient declined  Copy of [place] in Chart? No - copy requested No - copy requested No - copy requested    Tobacco Social History   Tobacco Use  Smoking Status Former Smoker  . Last attempt to quit: 06/04/1951  . Years since quitting: 67.0  Smokeless Tobacco Never Used  Tobacco Comment   Smoked 1945-1953 , up to 2 cigarettes/day     Counseling given: Not Answered Comment: Smoked 1945-1953 , up to 2 cigarettes/day   Clinical Intake:  Pre-visit preparation completed: Yes  Pain : No/denies pain     Diabetes: No  How often do you need to have someone help you when you read instructions, pamphlets, or other written materials from your doctor or pharmacy?: 1 - Never  Interpreter Needed?: No     Past Medical History:  Diagnosis Date  . A-fib ([place])   . Acid reflux disease   . Arrhythmia   . Chronic diastolic CHF (congestive heart failure) ([place])   . Compression fracture of lumbar vertebra ([place])   .  Diverticulitis 01-2008   [facility]  . Diverticulosis   . Esophageal stricture 2006  . Femoral bruit    Right  . Fibrocystic breast disease   . Hiatal hernia   . Hypercholesterolemia    [place] study LDL goal = < 160  . Mitral regurgitation    a. 06/2014 EF 55-60%, elevated end-diastolic pressures, dilated LA at 4.3 cm, mildly dilated RA, severe mitral regurgitation, mild aortic sclerosis without stenosis, mod-severe TR  . Mitral valve prolapse   . Osteoporosis    Dr Matthew Saras  . Pancreatitis 07-2008   Hospitalized   . Schatzki's ring   . Skin cancer    facial x 2. [name]   Past Surgical History:  Procedure Laterality Date  . COLONOSCOPY  2006  . Epidural steroids     x1  . ESOPHAGEAL DILATION  2006  . G3 P4 (twins)    . SKIN CANCER EXCISION     Family History  Problem Relation Age of Onset  . Breast cancer Maternal Aunt   . Hepatitis Daughter        C  . Lung disease Mother        lung tumor  . Heart attack Daughter 65       S/P stents  . Heart attack Sister 62  . Diabetes Neg Hx   . Stroke Neg Hx    Social History  Socioeconomic History  . Marital status: Widowed    Spouse name: Not on file  . Number of children: 3  . Years of education: Not on file  . Highest education level: Not on file  Occupational History  . 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 Financial resource strain: Not hard at all  . Food insecurity:    Worry: Never true    Inability: Never true  . Transportation needs:    Medical: No    Non-medical: No  Tobacco Use  . Smoking status: Former Smoker    Last attempt to quit: 06/04/1951    Years since quitting: 67.0  . Smokeless tobacco: Never Used  . Tobacco comment: Smoked 1945-1953 , up to 2 cigarettes/day  Substance and Sexual Activity  . Alcohol use: Yes    Alcohol/week: 7.0 standard drinks    Types: 7 Glasses of wine per week  . Drug use: No  . Sexual  activity: Not Currently  Lifestyle  . Physical activity:    Days per week: 2 days    Minutes per session: 10 min  . Stress: Not at all  Relationships  . Social connections:    Talks on phone: Not on file    Gets together: Not on file    Attends religious service: Not on file    Active member of club or organization: Not on file    Attends meetings of clubs or organizations: Not on file    Relationship status: Not on file  Other Topics Concern  . Not on file  Social History Narrative   Has living will    Would want daughter Kylie May to be health care POA   Not sure about DNR--definitely doesn't want prolonged life support   Not sure about tube feeds either (but probably not)          Outpatient Encounter Medications as of 06/11/2018  Medication Sig  . apixaban (ELIQUIS) 2.5 MG TABS tablet Take 1 tablet (2.5 mg total) by mouth 2 (two) times daily.  . carvedilol (COREG) 3.125 MG tablet Take 1 tablet (3.125 mg total) by mouth 2 (two) times daily with a meal.  . flecainide (TAMBOCOR) 50 MG tablet Take 1 tablet (50 mg total) by mouth 2 (two) times daily.  . magnesium chloride (SLOW-MAG) 64 MG TBEC SR tablet Take 2 tablets (128 mg total) by mouth daily.  [name] omeprazole (PRILOSEC) 40 MG capsule Take 1 capsule (40 mg total) by mouth daily.  . rosuvastatin (CRESTOR) 20 MG tablet Take 1 tablet (20 mg total) by mouth daily.  . [DISCONTINUED] hydrocortisone 2.5 % ointment    No facility-administered encounter medications on file as of 06/11/2018.     Activities of Daily Living In your present state of health, do you have any difficulty performing the following activities: 06/11/2018  Hearing? Y  Comment Hearing aids  Vision? N  Difficulty concentrating or making decisions? N  Walking or climbing stairs? N  Dressing or bathing? N  Doing errands, shopping? N  Preparing Food and eating ? N  Comment Minimal cooking. She mostly uses the microwave and makes sandwiches.  Using the Toilet? N  In  the past six months, have you accidently leaked urine? Y  Comment Managed with daily pad  Do you have problems with loss of bowel control? N  Managing your Medications? N  Managing your Finances? N  Housekeeping or managing your Housekeeping? N  Some recent  data might be hidden    Patient Care Team: [name], MD as PCP - General (Family Medicine) Minna Merritts, MD as Consulting Physician (Cardiology)    Assessment:   This is a routine wellness examination for [place].  Health Screenings  Mammogram-12/20/13 Bone Density-06/27/16 Glaucoma-none reported Hearing-hearing aids Cholesterol-01/14/17 (173) TSH-07/16/17 (18.00)  Social  Alcohol intake-yes, 1 glass of wine per day Smoking history- none Smokers in home? none Illicit drug use? none Exercise-walking Diet -yogurt, oatmeal, sandwiches, ice cream Sexually [place]  Patient feels safe at home.  Patient does have smoke detectors at home. Patient does wear sunscreen or protective clothing when in direct sunlight. Patient does wear seat belt when driving or riding with others.   Activities of Daily Living Patient can do their own household chores. Denies needing assistance with: driving, feeding themselves, getting from bed to chair, getting to the toilet, bathing/showering, dressing, managing money, climbing flight of stairs, or preparing meals.   Depression Screen Patient denies losing interest in daily life, feeling hopeless, or crying easily over simple problems.   Fall Screen Patient denies being afraid of falling.  No falls since the last in Sept 2019.   Memory Screen Patient denies problems with memory, misplacing items, and is able to balance checkbook/bank accounts. Daughter assists as needed.  Patient is alert, normal appearance, oriented to person/place/and time. Correctly identified the president of the [place], recall of 3/3 objects, and performing simple calculations.  Patient displays  appropriate judgement and can read correct time from watch face.   Immunizations The following Immunizations are up to date: Influenza, shingles, pneumonia, and tetanus.   Other Providers Patient Care Team: [name], MD as PCP - General (Family Medicine) Minna Merritts, MD as Consulting Physician (Cardiology)  Exercise Activities and Dietary recommendations Current Exercise Habits: Home exercise routine, Type of exercise: walking, Time (Minutes): 10, Frequency (Times/Week): 2, Weekly Exercise (Minutes/Week): 20, Intensity: Mild  Goals    . DIET - INCREASE WATER INTAKE     Stay hydrated       Fall Risk Fall Risk  05/30/2017 10/02/2016 05/30/2016 02/17/2014 11/10/2012  Falls in the past year? No No Yes Yes No  Number falls in past yr: - - 2 or more 1 -  Injury with Fall? - - Yes Yes -  Risk Factor Category  - - High Fall Risk High Fall Risk -  Risk for fall due to : - - History of fall(s) History of fall(s) -  Follow up - - Education provided;Falls prevention discussed - -   Depression Screen PHQ 2/9 Scores 06/11/2018 05/30/2017 10/02/2016 05/30/2016  PHQ - 2 Score 0 0 0 0     Cognitive Function MMSE - Mini Mental State Exam 05/30/2016  Orientation to time 5  Orientation to Place 5  Registration 3  Attention/ Calculation 5  Recall 3  Language- name 2 objects 2  Language- repeat 1  Language- follow 3 step command 3  Language- read & follow direction 1  Write a sentence 1  Copy design 1  Total score 30     6CIT Screen 06/11/2018 05/30/2017  What Year? 0 points 0 points  What month? 0 points 0 points  What time? 0 points 0 points  Count back from 20 0 points 0 points  Months in reverse 0 points 0 points  Repeat phrase 0 points 0 points  Total Score 0 0    Immunization History  Administered Date(s) Administered  .  H1N1 08/08/2008  . Influenza, High Dose Seasonal PF 02/24/2018  . Influenza,inj,Quad PF,6+ Mos 02/17/2014  . Influenza-Unspecified 03/03/2016,  02/24/2017  . Pneumococcal Conjugate-13 02/17/2014  . Pneumococcal Polysaccharide-23 11/10/2012  . Td 01/09/2010  . Tdap 04/18/2016  . Zoster Recombinat (Shingrix) 11/25/2016, 04/12/2017   Screening Tests Health Maintenance  Topic Date Due  . INFLUENZA VACCINE  01/01/2018  . TETANUS/TDAP  04/18/2026  . DEXA SCAN  Completed  . PNA vac Low Risk Adult  Completed      Plan:   End of life planning; Advance aging; Advanced directives discussed. Copy of current HCPOA/Living Will requested.    I have personally reviewed and noted the following in the patient's chart:   . Medical and social history . Use of alcohol, tobacco or illicit drugs  . Current medications and supplements . Functional ability and status . Nutritional status . Physical activity . Advanced directives . List of other physicians . Hospitalizations, surgeries, and ER visits in previous 12 months . Vitals . Screenings to include cognitive, depression, and falls . Referrals and appointments  In addition, I have reviewed and discussed with patient certain preventive protocols, quality metrics, and best practice recommendations. A written personalized care plan for preventive services as well as general preventive health recommendations were provided to patient.     Varney Biles, LPN  06/05/1436

## 2018-06-11 NOTE — Progress Notes (Signed)
Reviewed LPN note. No acute issues. Will follow with PCP

## 2018-06-11 NOTE — Patient Instructions (Addendum)
  Ms. Duba , Thank you for taking time to come for your Medicare Wellness Visit. I appreciate your ongoing commitment to your health goals. Please review the following plan we discussed and let me know if I can assist you in the future.   Follow up as needed.    Bring a copy of your [place] and/or Living Will to be scanned into chart.  Have a great day!  These are the goals we discussed: Goals    . DIET - INCREASE WATER INTAKE     Stay hydrated       This is a list of the screening recommended for you and due dates:  Health Maintenance  Topic Date Due  . Flu Shot  01/01/2018  . Tetanus Vaccine  04/18/2026  . DEXA scan (bone density measurement)  Completed  . Pneumonia vaccines  Completed

## 2018-07-05 ENCOUNTER — Emergency Department: Payer: Medicare Other

## 2018-07-05 ENCOUNTER — Inpatient Hospital Stay
Admission: EM | Admit: 2018-07-05 | Discharge: 2018-07-07 | DRG: 445 | Disposition: A | Discharge status: Home / Self Care | Payer: Medicare Other | Attending: Internal Medicine | Admitting: Internal Medicine

## 2018-07-05 ENCOUNTER — Encounter: Payer: Self-pay | Admitting: Emergency Medicine

## 2018-07-05 DIAGNOSIS — I340 Nonrheumatic mitral (valve) insufficiency: Secondary | ICD-10-CM | POA: Diagnosis present

## 2018-07-05 DIAGNOSIS — E785 Hyperlipidemia, unspecified: Secondary | ICD-10-CM | POA: Diagnosis not present

## 2018-07-05 DIAGNOSIS — I700 Atherosclerosis of aorta: Secondary | ICD-10-CM | POA: Diagnosis present

## 2018-07-05 DIAGNOSIS — R112 Nausea with vomiting, unspecified: Secondary | ICD-10-CM | POA: Diagnosis not present

## 2018-07-05 DIAGNOSIS — Z87891 Personal history of nicotine dependence: Secondary | ICD-10-CM | POA: Diagnosis not present

## 2018-07-05 DIAGNOSIS — R079 Chest pain, unspecified: Secondary | ICD-10-CM | POA: Diagnosis not present

## 2018-07-05 DIAGNOSIS — K8020 Calculus of gallbladder without cholecystitis without obstruction: Secondary | ICD-10-CM | POA: Diagnosis not present

## 2018-07-05 DIAGNOSIS — I714 Abdominal aortic aneurysm, without rupture: Secondary | ICD-10-CM | POA: Diagnosis present

## 2018-07-05 DIAGNOSIS — K449 Diaphragmatic hernia without obstruction or gangrene: Secondary | ICD-10-CM | POA: Diagnosis present

## 2018-07-05 DIAGNOSIS — R935 Abnormal findings on diagnostic imaging of other abdominal regions, including retroperitoneum: Secondary | ICD-10-CM | POA: Diagnosis not present

## 2018-07-05 DIAGNOSIS — K819 Cholecystitis, unspecified: Secondary | ICD-10-CM | POA: Diagnosis not present

## 2018-07-05 DIAGNOSIS — Z8249 Family history of ischemic heart disease and other diseases of the circulatory system: Secondary | ICD-10-CM

## 2018-07-05 DIAGNOSIS — K828 Other specified diseases of gallbladder: Secondary | ICD-10-CM | POA: Diagnosis not present

## 2018-07-05 DIAGNOSIS — Z85828 Personal history of other malignant neoplasm of skin: Secondary | ICD-10-CM | POA: Diagnosis not present

## 2018-07-05 DIAGNOSIS — I480 Paroxysmal atrial fibrillation: Secondary | ICD-10-CM | POA: Diagnosis present

## 2018-07-05 DIAGNOSIS — R109 Unspecified abdominal pain: Secondary | ICD-10-CM | POA: Diagnosis not present

## 2018-07-05 DIAGNOSIS — K8010 Calculus of gallbladder with chronic cholecystitis without obstruction: Secondary | ICD-10-CM | POA: Diagnosis present

## 2018-07-05 DIAGNOSIS — I341 Nonrheumatic mitral (valve) prolapse: Secondary | ICD-10-CM | POA: Diagnosis present

## 2018-07-05 DIAGNOSIS — R945 Abnormal results of liver function studies: Secondary | ICD-10-CM | POA: Diagnosis not present

## 2018-07-05 DIAGNOSIS — H9190 Unspecified hearing loss, unspecified ear: Secondary | ICD-10-CM | POA: Diagnosis present

## 2018-07-05 DIAGNOSIS — Z79899 Other long term (current) drug therapy: Secondary | ICD-10-CM

## 2018-07-05 DIAGNOSIS — M810 Age-related osteoporosis without current pathological fracture: Secondary | ICD-10-CM | POA: Diagnosis present

## 2018-07-05 DIAGNOSIS — Z880 Allergy status to penicillin: Secondary | ICD-10-CM | POA: Diagnosis not present

## 2018-07-05 DIAGNOSIS — K8050 Calculus of bile duct without cholangitis or cholecystitis without obstruction: Secondary | ICD-10-CM

## 2018-07-05 DIAGNOSIS — R1013 Epigastric pain: Secondary | ICD-10-CM | POA: Diagnosis not present

## 2018-07-05 DIAGNOSIS — R918 Other nonspecific abnormal finding of lung field: Secondary | ICD-10-CM | POA: Diagnosis not present

## 2018-07-05 DIAGNOSIS — R001 Bradycardia, unspecified: Secondary | ICD-10-CM | POA: Diagnosis not present

## 2018-07-05 DIAGNOSIS — Z7901 Long term (current) use of anticoagulants: Secondary | ICD-10-CM

## 2018-07-05 DIAGNOSIS — I5032 Chronic diastolic (congestive) heart failure: Secondary | ICD-10-CM | POA: Diagnosis present

## 2018-07-05 DIAGNOSIS — R748 Abnormal levels of other serum enzymes: Secondary | ICD-10-CM | POA: Diagnosis not present

## 2018-07-05 DIAGNOSIS — R1011 Right upper quadrant pain: Secondary | ICD-10-CM | POA: Diagnosis not present

## 2018-07-05 DIAGNOSIS — K219 Gastro-esophageal reflux disease without esophagitis: Secondary | ICD-10-CM | POA: Diagnosis present

## 2018-07-05 LAB — BASIC METABOLIC PANEL
Anion gap: 7 (ref 5–15)
BUN: 18 mg/dL (ref 8–23)
CO2: 24 mmol/L (ref 22–32)
Calcium: 9.3 mg/dL (ref 8.9–10.3)
Chloride: 105 mmol/L (ref 98–111)
Creatinine, Ser: 0.92 mg/dL (ref 0.44–1.00)
GFR calc non Af Amer: 53 mL/min — ABNORMAL LOW (ref >60)
Glucose, Bld: 133 mg/dL — ABNORMAL HIGH (ref 70–99)
Potassium: 4.1 mmol/L (ref 3.5–5.1)
Sodium: 136 mmol/L (ref 135–145)

## 2018-07-05 LAB — URINALYSIS, COMPLETE (UACMP) WITH MICROSCOPIC
Bacteria, UA: NONE SEEN
Bilirubin Urine: NEGATIVE
Glucose, UA: NEGATIVE mg/dL
Hgb urine dipstick: NEGATIVE
KETONES UR: NEGATIVE mg/dL
Leukocytes, UA: NEGATIVE
Nitrite: NEGATIVE
Protein, ur: NEGATIVE mg/dL
Specific Gravity, Urine: >1.046 — ABNORMAL HIGH (ref 1.005–1.030)
pH: 5 (ref 5.0–8.0)

## 2018-07-05 LAB — MAGNESIUM: Magnesium: 1.9 mg/dL (ref 1.7–2.4)

## 2018-07-05 LAB — HEPATIC FUNCTION PANEL
ALT: 20 U/L (ref 0–44)
AST: 42 U/L — AB (ref 15–41)
Albumin: 4 g/dL (ref 3.5–5.0)
Alkaline Phosphatase: 37 U/L — ABNORMAL LOW (ref 38–126)
BILIRUBIN DIRECT: 0.3 mg/dL — AB (ref 0.0–0.2)
Indirect Bilirubin: 0.6 mg/dL (ref 0.3–0.9)
Total Bilirubin: 0.9 mg/dL (ref 0.3–1.2)
Total Protein: 6.6 g/dL (ref 6.5–8.1)

## 2018-07-05 LAB — LACTIC ACID, PLASMA: Lactic Acid, Venous: 1.9 mmol/L (ref 0.5–1.9)

## 2018-07-05 LAB — PROTIME-INR
INR: 1.09
Prothrombin Time: 14 seconds (ref 11.4–15.2)

## 2018-07-05 LAB — CBC
HCT: 36.1 % (ref 36.0–46.0)
Hemoglobin: 12.1 g/dL (ref 12.0–15.0)
MCH: 29.2 pg (ref 26.0–34.0)
MCHC: 33.5 g/dL (ref 30.0–36.0)
MCV: 87.2 fL (ref 80.0–100.0)
Platelets: 277 10*3/uL (ref 150–400)
RBC: 4.14 MIL/uL (ref 3.87–5.11)
RDW: 12.6 % (ref 11.5–15.5)
WBC: 15 10*3/uL — AB (ref 4.0–10.5)
nRBC: 0 % (ref 0.0–0.2)

## 2018-07-05 LAB — SAMPLE TO BLOOD BANK

## 2018-07-05 LAB — PROCALCITONIN: Procalcitonin: <0.1 ng/mL

## 2018-07-05 LAB — PHOSPHORUS: Phosphorus: 3.7 mg/dL (ref 2.5–4.6)

## 2018-07-05 LAB — TROPONIN I: Troponin I: <0.03 ng/mL (ref <0.03)

## 2018-07-05 LAB — LIPASE, BLOOD: Lipase: 69 U/L — ABNORMAL HIGH (ref 11–51)

## 2018-07-05 MED ORDER — CARVEDILOL 3.125 MG PO TABS
3.1250 mg | ORAL_TABLET | Freq: Two times a day (BID) | ORAL | Status: DC
  Administered 2018-07-06 – 2018-07-07 (×3): 3.125 mg via ORAL
  Filled 2018-07-05 (×3): qty 1

## 2018-07-05 MED ORDER — PIPERACILLIN-TAZOBACTAM 3.375 G IVPB
3.3750 g | Freq: Three times a day (TID) | INTRAVENOUS | Status: DC
  Administered 2018-07-05 – 2018-07-07 (×5): 3.375 g via INTRAVENOUS
  Filled 2018-07-05 (×5): qty 50

## 2018-07-05 MED ORDER — APIXABAN 2.5 MG PO TABS
2.5000 mg | ORAL_TABLET | Freq: Two times a day (BID) | ORAL | Status: DC
  Administered 2018-07-06 (×2): 2.5 mg via ORAL
  Filled 2018-07-05 (×2): qty 1

## 2018-07-05 MED ORDER — PANTOPRAZOLE SODIUM 40 MG PO TBEC
40.0000 mg | DELAYED_RELEASE_TABLET | Freq: Every day | ORAL | Status: DC
  Administered 2018-07-06 – 2018-07-07 (×2): 40 mg via ORAL
  Filled 2018-07-05 (×2): qty 1

## 2018-07-05 MED ORDER — PIPERACILLIN-TAZOBACTAM 3.375 G IVPB 30 MIN: 3.3750 g | Freq: Three times a day (TID) | INTRAVENOUS | Status: DC

## 2018-07-05 MED ORDER — ONDANSETRON HCL 4 MG/2ML IJ SOLN
4.0000 mg | Freq: Four times a day (QID) | INTRAMUSCULAR | Status: DC | PRN
  Administered 2018-07-05: 4 mg via INTRAVENOUS
  Filled 2018-07-05: qty 2

## 2018-07-05 MED ORDER — BISACODYL 5 MG PO TBEC: 5.0000 mg | DELAYED_RELEASE_TABLET | Freq: Every day | ORAL | Status: DC | PRN

## 2018-07-05 MED ORDER — SODIUM CHLORIDE 0.9 % IV SOLN
INTRAVENOUS | Status: AC
  Administered 2018-07-05: via INTRAVENOUS

## 2018-07-05 MED ORDER — IOHEXOL 350 MG/ML SOLN
75.0000 mL | Freq: Once | INTRAVENOUS | Status: AC | PRN
  Administered 2018-07-05: 75 mL via INTRAVENOUS

## 2018-07-05 MED ORDER — ONDANSETRON HCL 4 MG PO TABS: 4.0000 mg | ORAL_TABLET | Freq: Four times a day (QID) | ORAL | Status: DC | PRN

## 2018-07-05 MED ORDER — SENNOSIDES-DOCUSATE SODIUM 8.6-50 MG PO TABS: 1.0000 | ORAL_TABLET | Freq: Every evening | ORAL | Status: DC | PRN

## 2018-07-05 MED ORDER — FLECAINIDE ACETATE 50 MG PO TABS
50.0000 mg | ORAL_TABLET | Freq: Two times a day (BID) | ORAL | Status: DC
  Administered 2018-07-06 – 2018-07-07 (×4): 50 mg via ORAL
  Filled 2018-07-05 (×5): qty 1

## 2018-07-05 MED ORDER — ROSUVASTATIN CALCIUM 20 MG PO TABS
20.0000 mg | ORAL_TABLET | Freq: Every evening | ORAL | Status: DC
  Administered 2018-07-06: 18:00:00 20 mg via ORAL
  Filled 2018-07-05: qty 1

## 2018-07-05 MED ORDER — MORPHINE SULFATE (PF) 4 MG/ML IV SOLN: 4.0000 mg | INTRAVENOUS | Status: DC | PRN

## 2018-07-05 MED ORDER — SODIUM CHLORIDE 0.9 % IV SOLN
1.0000 g | Freq: Once | INTRAVENOUS | Status: AC
  Administered 2018-07-05: 1 g via INTRAVENOUS
  Filled 2018-07-05: qty 10

## 2018-07-05 MED ORDER — SODIUM CHLORIDE 0.9% FLUSH: 3.0000 mL | Freq: Once | INTRAVENOUS | Status: DC

## 2018-07-05 MED ORDER — ACETAMINOPHEN 650 MG RE SUPP: 650.0000 mg | Freq: Four times a day (QID) | RECTAL | Status: DC | PRN

## 2018-07-05 MED ORDER — ACETAMINOPHEN 325 MG PO TABS
650.0000 mg | ORAL_TABLET | Freq: Four times a day (QID) | ORAL | Status: DC | PRN
  Administered 2018-07-06: 650 mg via ORAL
  Filled 2018-07-05: qty 2

## 2018-07-05 MED ORDER — MAGNESIUM CHLORIDE 64 MG PO TBEC
2.0000 | DELAYED_RELEASE_TABLET | Freq: Every day | ORAL | Status: DC
  Administered 2018-07-06 – 2018-07-07 (×2): 128 mg via ORAL
  Filled 2018-07-05 (×2): qty 2

## 2018-07-05 MED ORDER — MORPHINE SULFATE (PF) 2 MG/ML IV SOLN: 2.0000 mg | INTRAVENOUS | Status: DC | PRN

## 2018-07-05 NOTE — ED Triage Notes (Signed)
Patient presents to the ED with chest pain that radiates into her back.  Patient describes pain as severe.  Patient is having difficulty sitting still in the wheelchair.

## 2018-07-05 NOTE — Progress Notes (Signed)
Pt A&O. Family expressing concern Pt has new presentation of shivers. Temp increase 1 degree up to 99.3 in one hour from admission when temp read 98.3. Pt and pt family report pt temp typically ranges 97.3. Antibiotics running. Will continue to monitor. Pt and family aware of POC and express to be in agreement.

## 2018-07-05 NOTE — ED Notes (Signed)
[place]

## 2018-07-05 NOTE — ED Provider Notes (Addendum)
[facility] Emergency Department Provider Note  ____________________________________________   I have reviewed the triage vital signs and the nursing notes. Where available I have reviewed prior notes and, if possible and indicated, outside hospital notes.    HISTORY  Chief Complaint Chest Pain    HPI Kylie May is a 85 y.o. female who presents today complaining of gastric abdominal pain radiating to the back.  She has a history of hiatal hernias, according to her son she has a small, apparently trivial AAA but the dimensions are not known, history of reflux disease, has recently changed her anti-acid medication.  Began with gradual onset after lunch epigastric abdominal pain that radiates to the back.  Waxes and wanes.  At this time it is mild but has been significant all during the course of this period.  She has had no fevers no vomiting no melena.  She does not feel numb or weak anywhere.  Sharp pain, nothing makes it better nothing makes it worse it just waxes and wanes on its own.  Has not had this pain before. Patient is on Eliquis.     Past Medical History:  Diagnosis Date  . A-fib ([place])   . Acid reflux disease   . Arrhythmia   . Chronic diastolic CHF (congestive heart failure) ([place])   . Compression fracture of lumbar vertebra ([place])   . Diverticulitis 01-2008   GI [place]  . Diverticulosis   . Esophageal stricture 2006  . Femoral bruit    Right  . Fibrocystic breast disease   . Hiatal hernia   . Hypercholesterolemia    [place] study LDL goal = < 160  . Mitral regurgitation    a. 06/2014 EF 55-60%, elevated end-diastolic pressures, dilated LA at 4.3 cm, mildly dilated RA, severe mitral regurgitation, mild aortic sclerosis without stenosis, mod-severe TR  . Mitral valve prolapse   . Osteoporosis    Dr Matthew Saras  . Pancreatitis 07-2008   Hospitalized   . Schatzki's ring   . Skin cancer    facial x 2. [name]    Patient Active  Problem List   Diagnosis Date Noted  . Patella fracture 05/13/2018  . Tooth injury 11/05/2017  . Fall 10/31/2017  . Abdominal aortic atherosclerosis ([place]) 09/16/2017  . Rib pain 09/04/2017  . Rash 09/04/2017  . Elevated TSH 09/04/2017  . Weight loss 07/16/2017  . Left ankle pain 07/16/2017  . Chronic back pain 01/14/2017  . Low back pain 10/14/2016  . Frequent falls 04/18/2016  . A-fib ([place])   . Mitral regurgitation   . Chronic diastolic CHF (congestive heart failure) ([place])   . Hearing loss 01/09/2010  . History of skin cancer 12/29/2008  . Hyperlipidemia 08/10/2007  . GERD (gastroesophageal reflux disease) 02/22/2007  . History of esophageal stricture 06/14/2004    Past Surgical History:  Procedure Laterality Date  . COLONOSCOPY  2006  . Epidural steroids     x1  . ESOPHAGEAL DILATION  2006  . G3 P4 (twins)    . SKIN CANCER EXCISION      Prior to Admission medications   Medication Sig Start Date End Date Taking? Authorizing Provider  apixaban (ELIQUIS) 2.5 MG TABS tablet Take 1 tablet (2.5 mg total) by mouth 2 (two) times daily. 09/23/17   Minna Merritts, MD  carvedilol (COREG) 3.125 MG tablet Take 1 tablet (3.125 mg total) by mouth 2 (two) times daily with a meal. 09/23/17   Gollan, Kathlene November, MD  flecainide (  TAMBOCOR) 50 MG tablet Take 1 tablet (50 mg total) by mouth 2 (two) times daily. 09/23/17   Minna Merritts, MD  magnesium chloride (SLOW-MAG) 64 MG TBEC SR tablet Take 2 tablets (128 mg total) by mouth daily. 12/18/17   [name], MD  omeprazole (PRILOSEC) 40 MG capsule Take 1 capsule (40 mg total) by mouth daily. 05/13/18 06/12/18  [name], MD  rosuvastatin (CRESTOR) 20 MG tablet Take 1 tablet (20 mg total) by mouth daily. 09/23/17   Minna Merritts, MD    Allergies Metronidazole and Penicillin g  Family History  Problem Relation Age of Onset  . Breast cancer Maternal Aunt   . Hepatitis Daughter        C  . Lung disease Mother         lung tumor  . Heart attack Daughter 36       S/P stents  . Heart attack Sister 45  . Diabetes Neg Hx   . Stroke Neg Hx     Social History Social History   Tobacco Use  . Smoking status: Former Smoker    Last attempt to quit: 06/04/1951    Years since quitting: 67.1  . Smokeless tobacco: Never Used  . Tobacco comment: Smoked 1945-1953 , up to 2 cigarettes/day  Substance Use Topics  . Alcohol use: Yes    Alcohol/week: 7.0 standard drinks    Types: 7 Glasses of wine per week  . Drug use: No    Review of Systems Constitutional: No fever/chills Eyes: No visual changes. ENT: No sore throat. No stiff neck no neck pain Cardiovascular: Denies chest pain. Respiratory: Denies shortness of breath. Gastrointestinal:   no vomiting.  No diarrhea.  No constipation. Genitourinary: Negative for dysuria. Musculoskeletal: Negative lower extremity swelling Skin: Negative for rash. Neurological: Negative for severe headaches, focal weakness or numbness.   ____________________________________________   PHYSICAL EXAM:  VITAL SIGNS: ED Triage Vitals  Enc Vitals Group     BP 07/05/18 1642 (!) 122/106     Pulse Rate 07/05/18 1644 (!) 56     Resp 07/05/18 1644 18     Temp 07/05/18 1644 97.7 F (36.5 C)     Temp Source 07/05/18 1644 Oral     SpO2 07/05/18 1644 97 %     Weight 07/05/18 1648 104 lb (47.2 kg)     Height 07/05/18 1648 5\' 4"  (1.626 m)     Head Circumference --      Peak Flow --      Pain Score 07/05/18 1643 10     Pain Loc --      Pain Edu? --      Excl. in [place]? --     Constitutional: Alert and oriented. Well appearing and in no acute distress. Eyes: Conjunctivae are normal Head: Atraumatic HEENT: No congestion/rhinnorhea. Mucous membranes are moist.  Oropharynx non-erythematous Neck:   Nontender with no meningismus, no masses, no stridor Cardiovascular: Normal rate, regular rhythm. Grossly normal heart sounds.  Good peripheral circulation. Respiratory: Normal  respiratory effort.  No retractions. Lungs CTAB. Abdominal: Soft epigastric tenderness with some guarding, nonsurgical abdomen or rebound, not distended. Back:  There is no focal tenderness or step off.  there is no midline tenderness there are no lesions noted. there is no CVA tenderness  Musculoskeletal: No lower extremity tenderness, no upper extremity tenderness. No joint effusions, no DVT signs strong distal pulses no edema Neurologic:  Normal speech and language. No gross focal neurologic deficits  are appreciated.  Skin:  Skin is warm, dry and intact. No rash noted. Psychiatric: Mood and affect are normal. Speech and behavior are normal.  ____________________________________________   LABS (all labs ordered are listed, but only abnormal results are displayed)  Labs Reviewed  BASIC METABOLIC PANEL - Abnormal; Notable for the following components:      Result Value   Glucose, Bld 133 (*)    GFR calc non Af Amer 53 (*)    All other components within normal limits  CBC - Abnormal; Notable for the following components:   WBC 15.0 (*)    All other components within normal limits  TROPONIN I  PROTIME-INR    Pertinent labs  results that were available during my care of the patient were reviewed by me and considered in my medical decision making (see chart for details). ____________________________________________  EKG  I personally interpreted any EKGs ordered by me or triage Sinus bradycardia rate 52 bpm, normal axis no acute ST elevation or depression ____________________________________________  RADIOLOGY  Pertinent labs & imaging results that were available during my care of the patient were reviewed by me and considered in my medical decision making (see chart for details). If possible, patient and/or family made aware of any abnormal findings.  Dg Chest 2 View  Result Date: 07/05/2018 CLINICAL DATA:  Acute chest pain. EXAM: CHEST - 2 VIEW COMPARISON:  10/31/2017 and prior  radiographs FINDINGS: Cardiomegaly again noted. LOWER lobe opacity on the LATERAL view may represent atelectasis or airspace disease/pneumonia. There is no evidence of pulmonary edema, suspicious pulmonary nodule/mass, pleural effusion, or pneumothorax. No acute bony abnormalities are identified. A severe thoracolumbar scoliosis is again noted. IMPRESSION: LOWER lobe opacity on the LATERAL view which may represent atelectasis or pneumonia. Cardiomegaly Electronically Signed   By: Margarette Canada M.D.   On: 07/05/2018 17:36   ____________________________________________    PROCEDURES  Procedure(s) performed: None  Procedures  Critical Care performed: None  ____________________________________________   INITIAL IMPRESSION / ASSESSMENT AND PLAN / ED COURSE  Pertinent labs & imaging results that were available during my care of the patient were reviewed by me and considered in my medical decision making (see chart for details).  Patient with epigastric pain radiating to the back, differential includes perforated viscus, as well as expanding AAA.  Given known history of AAA we will obtain CT scan I do not definitively palpate a pulsatile mass but I am reluctant to be too aggressive in palpation.  Patient does have good distal pulses at this time.  We will obtain imaging of the aorta.  Blood work and vital signs are reassuring and she states at this time she does not want pain medication but we will continue to watch.  ----------------------------------------- 7:33 PM on 07/05/2018 -----------------------------------------  States her pain is gone away "on its own" and she feels better, we are getting an ultrasound, she does have a history of gallbladder issues and ERCP in the past, her CT scan does suggest some evidence of gallbladder disease.  Left liver function tests are pending.    ____________________________________________   FINAL CLINICAL IMPRESSION(S) / ED DIAGNOSES  Final  diagnoses:  None      This chart was dictated using voice recognition software.  Despite best efforts to proofread,  errors can occur which can change meaning.      Schuyler Amor, MD 07/05/18 1756    Schuyler Amor, MD 07/05/18 [phone]

## 2018-07-05 NOTE — ED Notes (Signed)
ED TO INPATIENT HANDOFF REPORT  Name/Age/Gender Kylie May 85 y.o. female  Code Status   Home/SNF/Other Home  Chief Complaint chest pain  Level of Care/Admitting Diagnosis ED Disposition    ED Disposition Condition [place]: [facility] [[id number]]  Level of Care: Med-Surg [16]  Diagnosis: Right upper quadrant abdominal pain [332951]  Admitting Physician: Arta Silence [[id number]]  Attending Physician: Arta Silence [[id number]]  Estimated length of stay: past midnight tomorrow  Certification:: I certify this patient will need inpatient services for at least 2 midnights  PT Class (Do Not Modify): Inpatient [101]  PT Acc Code (Do Not Modify): Private [1]       Medical History Past Medical History:  Diagnosis Date  . A-fib ([place])   . Acid reflux disease   . Arrhythmia   . Chronic diastolic CHF (congestive heart failure) ([place])   . Compression fracture of lumbar vertebra ([place])   . Diverticulitis 01-2008   GI [place]  . Diverticulosis   . Esophageal stricture 2006  . Femoral bruit    Right  . Fibrocystic breast disease   . Hiatal hernia   . Hypercholesterolemia    [place] study LDL goal = < 160  . Mitral regurgitation    a. 06/2014 EF 55-60%, elevated end-diastolic pressures, dilated LA at 4.3 cm, mildly dilated RA, severe mitral regurgitation, mild aortic sclerosis without stenosis, mod-severe TR  . Mitral valve prolapse   . Osteoporosis    Dr Matthew Saras  . Pancreatitis 07-2008   Hospitalized   . Schatzki's ring   . Skin cancer    facial x 2. [name]    Allergies Allergies  Allergen Reactions  . Metronidazole     Other reaction(s): Dizziness and giddiness (finding), Other (qualifier value) made things feel like they were vibrating like the bed and floor,also caused mild dizziness weakness  . Penicillin G Rash    IV Location/Drains/Wounds Patient Lines/Drains/Airways Status   Active  Line/Drains/Airways    Name:   Placement date:   Placement time:   Site:   Days:   Peripheral IV 07/05/18 Right Forearm   07/05/18    1746    Forearm   less than 1          Labs/Imaging Results for orders placed or performed during the hospital encounter of 07/05/18 (from the past 48 hour(s))  Basic metabolic panel     Status: Abnormal   Collection Time: 07/05/18  4:55 PM  Result Value Ref Range   Sodium 136 135 - 145 mmol/L   Potassium 4.1 3.5 - 5.1 mmol/L   Chloride 105 98 - 111 mmol/L   CO2 24 22 - 32 mmol/L   Glucose, Bld 133 (H) 70 - 99 mg/dL   BUN 18 8 - 23 mg/dL   Creatinine, Ser 0.92 0.44 - 1.00 mg/dL   Calcium 9.3 8.9 - 10.3 mg/dL   GFR calc non Af Amer 53 (L) >60 mL/min   GFR calc Af Amer >60 >60 mL/min   Anion gap 7 5 - 15    Comment: Performed at [facility], [address] [place], [place] [address]  CBC     Status: Abnormal   Collection Time: 07/05/18  4:55 PM  Result Value Ref Range   WBC 15.0 (H) 4.0 - 10.5 K/uL   RBC 4.14 3.87 - 5.11 MIL/uL   Hemoglobin 12.1 12.0 - 15.0 g/dL   HCT 36.1 36.0 - 46.0 %  MCV 87.2 80.0 - 100.0 fL   MCH 29.2 26.0 - 34.0 pg   MCHC 33.5 30.0 - 36.0 g/dL   RDW 12.6 11.5 - 15.5 %   Platelets 277 150 - 400 K/uL   nRBC 0.0 0.0 - 0.2 %    Comment: Performed at [facility], [address] [place], [place] [address]  Troponin I - ONCE - STAT     Status: None   Collection Time: 07/05/18  4:55 PM  Result Value Ref Range   Troponin I <0.03 <0.03 ng/mL    Comment: Performed at [facility], [address] [place], [place] [address]  Protime-INR (order if Patient is taking Coumadin / Warfarin)     Status: None   Collection Time: 07/05/18  4:55 PM  Result Value Ref Range   Prothrombin Time 14.0 11.4 - 15.2 seconds   INR 1.09     Comment: Performed at [facility], [address] [place], [place] [address]  Sample to Blood Bank     Status: None   Collection Time: 07/05/18  4:55 PM  Result  Value Ref Range   Blood Bank Specimen SAMPLE AVAILABLE FOR TESTING    Sample Expiration      07/08/2018 Performed at [facility], [address] [place], [place] [address]   Hepatic function panel     Status: Abnormal   Collection Time: 07/05/18  4:55 PM  Result Value Ref Range   Total Protein 6.6 6.5 - 8.1 g/dL   Albumin 4.0 3.5 - 5.0 g/dL   AST 42 (H) 15 - 41 U/L   ALT 20 0 - 44 U/L   Alkaline Phosphatase 37 (L) 38 - 126 U/L   Total Bilirubin 0.9 0.3 - 1.2 mg/dL   Bilirubin, Direct 0.3 (H) 0.0 - 0.2 mg/dL   Indirect Bilirubin 0.6 0.3 - 0.9 mg/dL    Comment: Performed at [facility], [address] [place], [place] [address]  Lipase, blood     Status: Abnormal   Collection Time: 07/05/18  4:55 PM  Result Value Ref Range   Lipase 69 (H) 11 - 51 U/L    Comment: Performed at [facility], [address] [place], [place] [address]  Magnesium     Status: None   Collection Time: 07/05/18  4:55 PM  Result Value Ref Range   Magnesium 1.9 1.7 - 2.4 mg/dL    Comment: Performed at [facility], [address] [place], [place] [address]  Phosphorus     Status: None   Collection Time: 07/05/18  4:55 PM  Result Value Ref Range   Phosphorus 3.7 2.5 - 4.6 mg/dL    Comment: Performed at [facility], [address] [place], [place] [address]  Lactic acid, plasma     Status: None   Collection Time: 07/05/18  6:52 PM  Result Value Ref Range   Lactic Acid, Venous 1.9 0.5 - 1.9 mmol/L    Comment: Performed at [facility], [address] [place], [place] [address]  Urinalysis, Complete w Microscopic     Status: Abnormal   Collection Time: 07/05/18  8:27 PM  Result Value Ref Range   Color, Urine YELLOW (A) YELLOW   APPearance CLEAR (A) CLEAR   Specific Gravity, Urine >1.046 (H) 1.005 - 1.030   pH 5.0 5.0 - 8.0   Glucose, UA NEGATIVE NEGATIVE mg/dL   Hgb urine dipstick NEGATIVE NEGATIVE   Bilirubin Urine NEGATIVE NEGATIVE  Ketones, ur NEGATIVE NEGATIVE mg/dL   Protein, ur NEGATIVE NEGATIVE mg/dL   Nitrite NEGATIVE NEGATIVE   Leukocytes, UA NEGATIVE NEGATIVE   RBC / HPF 0-5 0 - 5 RBC/hpf   WBC, UA 0-5 0 - 5 WBC/hpf   Bacteria, UA NONE SEEN NONE SEEN   Squamous Epithelial / LPF 0-5 0 - 5   Mucus PRESENT     Comment: Performed at [facility], [address] [place], [place] [address]   Dg Chest 2 View  Result Date: 07/05/2018 CLINICAL DATA:  Acute chest pain. EXAM: CHEST - 2 VIEW COMPARISON:  10/31/2017 and prior radiographs FINDINGS: Cardiomegaly again noted. LOWER lobe opacity on the LATERAL view may represent atelectasis or airspace disease/pneumonia. There is no evidence of pulmonary edema, suspicious pulmonary nodule/mass, pleural effusion, or pneumothorax. No acute bony abnormalities are identified. A severe thoracolumbar scoliosis is again noted. IMPRESSION: LOWER lobe opacity on the LATERAL view which may represent atelectasis or pneumonia. Cardiomegaly Electronically Signed   By: Margarette Canada M.D.   On: 07/05/2018 17:36   Ct Angio Chest/abd/pel For Dissection W And/or Wo Contrast  Result Date: 07/05/2018 CLINICAL DATA:  Chest pain radiates to the back. EXAM: CT ANGIOGRAPHY CHEST, ABDOMEN AND PELVIS TECHNIQUE: Multidetector CT imaging through the chest, abdomen and pelvis was performed using the standard protocol during bolus administration of intravenous contrast. Multiplanar reconstructed images and MIPs were obtained and reviewed to evaluate the vascular anatomy. CONTRAST:  60mL OMNIPAQUE IOHEXOL 350 MG/ML SOLN COMPARISON:  Abdomen pelvis CT 09/16/2017 FINDINGS: CTA CHEST FINDINGS Cardiovascular: Precontrast imaging shows no hyperdense crescent in the wall of the thoracic aorta to suggest acute intramural hematoma. Imaging after IV contrast administration shows no dissection of the thoracic aorta. No thoracic aortic aneurysm. There is abdominal aortic atherosclerosis without aneurysm. The heart size  is normal. No substantial pericardial effusion. No large central pulmonary embolus in either main pulmonary artery or lobar pulmonary arteries. Mediastinum/Nodes: No mediastinal lymphadenopathy. There is no hilar lymphadenopathy. The esophagus has normal imaging features. There is no axillary lymphadenopathy. Lungs/Pleura: The central tracheobronchial airways are patent. Basilar atelectasis noted bilaterally. No suspicious nodule or mass. No edema or pleural effusion. Musculoskeletal: No worrisome lytic or sclerotic osseous abnormality. Review of the MIP images confirms the above findings. CTA ABDOMEN AND PELVIS FINDINGS VASCULAR Aorta: Atherosclerotic without aneurysm or dissection. Celiac: calcific plaque at the origin but widely patent. SMA: Proximal calcific plaque without features to suggest flow limiting stenosis Renals: Calcific plaque at the ostium bilaterally with patent renal arteries bilaterally. IMA: Opacifies normally. Inflow: Patent bilaterally. Veins: Not well evaluated due to bolus timing. Review of the MIP images confirms the above findings. NON-VASCULAR Hepatobiliary: Probable perfusion anomaly at the gallbladder fossa. No focal abnormality in the liver parenchyma. Gallbladder is distended with layering stones in the fundus and mural calcification compatible with porcelain gallbladder. Mild intra and extrahepatic biliary duct dilatation evident with common bile duct measuring 10 mm diameter in the head of pancreas. Pancreas: Unremarkable. Spleen: No splenomegaly. No focal mass lesion. Adrenals/Urinary Tract: No adrenal nodule or mass. Cortical thinning noted both kidneys without hydronephrosis or suspicious mass lesion. No evidence for hydroureter. The urinary bladder appears normal for the degree of distention. Stomach/Bowel: Small hiatal hernia. Prominent fold thickness likely related to underdistention. Duodenum is normally positioned as is the ligament of Treitz. Duodenal diverticuli evident.  No small bowel wall thickening. No small bowel dilatation. The terminal ileum is normal. The appendix is normal. No gross colonic mass. No colonic  wall thickening. Diverticular changes are noted in the left colon without evidence of diverticulitis. Lymphatic: There is no gastrohepatic or hepatoduodenal ligament lymphadenopathy. No intraperitoneal or retroperitoneal lymphadenopathy. No pelvic sidewall lymphadenopathy. Reproductive: The uterus is unremarkable.  There is no adnexal mass. Other: No intraperitoneal free fluid. Musculoskeletal: old right superior and inferior pubic rami fractures noted. Bones diffusely demineralized. Thoracolumbar scoliosis evident. Review of the MIP images confirms the above findings. IMPRESSION: 1. No evidence for thoracoabdominal aortic dissection. No thoracoabdominal aortic aneurysm. No findings to suggest acute intramural hematoma in the thoracic aorta. There is no large central pulmonary embolus. 2. Small hiatal hernia. 3. Cholelithiasis with porcelain gallbladder. 4. Mild intra and extrahepatic biliary duct dilatation. Correlation with liver function test may prove helpful. Electronically Signed   By: Misty Stanley M.D.   On: 07/05/2018 18:24   US Abdomen Limited Ruq  Result Date: 07/05/2018 CLINICAL DATA:  Right upper quadrant pain. EXAM: ULTRASOUND ABDOMEN LIMITED RIGHT UPPER QUADRANT COMPARISON:  CT abdomen 09/16/2017 FINDINGS: Gallbladder: Gallbladder sludge in the gallbladder fundus with tiny cholelithiasis. Hyperechoic gallbladder wall as can be seen with porcelain gallbladder. Gallbladder wall thickening measuring 4.1 mm. Negative sonographic Murphy sign. Common bile duct: Diameter: 7.3 mm Liver: No focal lesion identified. Within normal limits in parenchymal echogenicity. Mild intrahepatic biliary ductal dilatation. Portal vein is patent on color Doppler imaging with normal direction of blood flow towards the liver. IMPRESSION: Gallbladder sludge with tiny cholelithiasis  and mild gallbladder wall thickening. Negative for sonographic Murphy's sign. Findings are equivocal for cholecystitis given lack of pain. If there is further clinical concern, recommend a HIDA scan. Hyperechoic gallbladder wall as can be seen with porcelain gallbladder. Electronically Signed   By: Kathreen Devoid   On: 07/05/2018 19:29    Pending Labs Unresulted Labs (From admission, onward)    Start     Ordered   07/05/18 2125  Procalcitonin - Baseline  Add-on,   AD     07/05/18 2124   Signed and Held  Basic metabolic panel  Tomorrow morning,   R     Signed and Held          Vitals/Pain Today's Vitals   07/05/18 1730 07/05/18 2030 07/05/18 2100 07/05/18 2130  BP: 126/65 135/75 126/65 (!) 134/57  Pulse:  77 77 74  Resp: 14 17 18 20   Temp:      TempSrc:      SpO2:  95% 97% 97%  Weight:      Height:      PainSc:        Isolation Precautions No active isolations  Medications Medications  morphine 2 MG/ML injection 2 mg (has no administration in time range)    Or  morphine 4 MG/ML injection 4 mg (has no administration in time range)  0.9 %  sodium chloride infusion (has no administration in time range)  cefTRIAXone (ROCEPHIN) 1 g in sodium chloride 0.9 % 100 mL IVPB (1 g Intravenous New Bag/Given 07/05/18 2147)  piperacillin-tazobactam (ZOSYN) IVPB 3.375 g (has no administration in time range)  iohexol (OMNIPAQUE) 350 MG/ML injection 75 mL (75 mLs Intravenous Contrast Given 07/05/18 1758)    Mobility walks

## 2018-07-05 NOTE — Consult Note (Signed)
Pharmacy Antibiotic Note  Kylie May is a 85 y.o. female admitted on 07/05/2018 with intra-abdominal infection    Pharmacy has been consulted for Zosyn dosing.  Plan: Zosyn 3.375g IV q8h (4 hour infusion).  Height: 5\' 4"  (162.6 cm) Weight: 104 lb (47.2 kg) IBW/kg (Calculated) : 54.7  Temp (24hrs), Avg:97.7 F (36.5 C), Min:97.7 F (36.5 C), Max:97.7 F (36.5 C)  Recent Labs  Lab 07/05/18 1655 07/05/18 1852  WBC 15.0*  --   CREATININE 0.92  --   LATICACIDVEN  --  1.9    Estimated Creatinine Clearance: 27.9 mL/min (by C-G formula based on SCr of 0.92 mg/dL).    Allergies  Allergen Reactions  . Metronidazole     Other reaction(s): Dizziness and giddiness (finding), Other (qualifier value) made things feel like they were vibrating like the bed and floor,also caused mild dizziness weakness  . Penicillin G Rash    Antimicrobials this admission: Ceftriaxone 2/2 x 1 Zosyn 2/2 >>  Dose adjustments this admission: N/A  Microbiology results: No labs at this time  Thank you for allowing pharmacy to be a part of this patient's care.  Lu Duffel, PharmD, BCPS Clinical Pharmacist 07/05/2018 10:08 PM

## 2018-07-05 NOTE — ED Notes (Signed)
Transport to floor room 101.AS

## 2018-07-05 NOTE — H&P (Signed)
[facility] [facility] [place] NAME: Kylie May    MR#:  [id number]  DATE OF BIRTH:  1921-09-17  DATE OF ADMISSION:  07/05/2018  PRIMARY CARE PHYSICIAN: [name], MD   REQUESTING/REFERRING PHYSICIAN: Schuyler Amor, MD  CHIEF COMPLAINT:   Chief Complaint  Patient presents with  . Chest Pain    HISTORY OF PRESENT ILLNESS:  Kylie May  is a 85 y.o. female with a known history of Afib (Eliquis) p/w AP/N/V. Pt is severely hard of hearing (HOH). Her daughter tells me she has been wearing hearing aids for a long time, but they largely do not help the pt anymore (x~4-87mo). Pt makes her own medical decisions, and is AAOx3 at baseline (and on present admission), but communicating w/ the pt is incredibly difficult. The daughter speaks slowly and loudly, but much of what was said still needed to be repeated multiple times. I cannot think of any recourse to this situation. Hx and limited ROS obtained from daughter at bedside. Pt developed epigastric/mid AP @~1400PM on Sunday 07/05/2018. I am told that pt had lunch in the early afternoon. A short time later, she had some regurgitation/vomiting of gastric acid. She had taken a Prilosec in the morning, but took another, as she thought the issue was indigestion. She developed AP a short time later, which was intermittent, radiating to the midback, but poorly-characterized. Discomfort was worse w/ position change (worse sitting up, improved lying back), and pt was unable to get comfortable. Pain progressed rapidly @~0400AM. Family drove pt to ED. VSS, comfortable and well-appearing at the time of my assessment. Her abdomen is non-tender to palpation; family states pt's symptoms have improved since coming to ED. CTA C/A/P (+) "Cholelithiasis with porcelain gallbladder. Mild intra and extrahepatic biliary duct dilatation." RUQ U/S (+) "Gallbladder sludge with tiny cholelithiasis and mild gallbladder wall  thickening. Negative for sonographic Murphy's sign. Hyperechoic gallbladder wall as can be seen with porcelain gallbladder."  PAST MEDICAL HISTORY:   Past Medical History:  Diagnosis Date  . A-fib ([place])   . Acid reflux disease   . Arrhythmia   . Chronic diastolic CHF (congestive heart failure) ([place])   . Compression fracture of lumbar vertebra ([place])   . Diverticulitis 01-2008   [facility] [place]  . Diverticulosis   . Esophageal stricture 2006  . Femoral bruit    Right  . Fibrocystic breast disease   . Hiatal hernia   . Hypercholesterolemia    [place] study LDL goal = < 160  . Mitral regurgitation    a. 06/2014 EF 55-60%, elevated end-diastolic pressures, dilated LA at 4.3 cm, mildly dilated RA, severe mitral regurgitation, mild aortic sclerosis without stenosis, mod-severe TR  . Mitral valve prolapse   . Osteoporosis    Dr Matthew Saras  . Pancreatitis 07-2008   Hospitalized   . Schatzki's ring   . Skin cancer    facial x 2. [name]    PAST SURGICAL HISTORY:   Past Surgical History:  Procedure Laterality Date  . COLONOSCOPY  2006  . Epidural steroids     x1  . ESOPHAGEAL DILATION  2006  . G3 P4 (twins)    . SKIN CANCER EXCISION      SOCIAL HISTORY:   Social History   Tobacco Use  . Smoking status: Former Smoker    Last attempt to quit: 06/04/1951    Years since quitting: 67.1  . Smokeless tobacco: Never Used  . Tobacco comment:  Smoked [phone] , up to 2 cigarettes/day  Substance Use Topics  . Alcohol use: Yes    Alcohol/week: 7.0 standard drinks    Types: 7 Glasses of wine per week    FAMILY HISTORY:   Family History  Problem Relation Age of Onset  . Breast cancer Maternal Aunt   . Hepatitis Daughter        C  . Lung disease Mother        lung tumor  . Heart attack Daughter 30       S/P stents  . Heart attack Sister 90  . Diabetes Neg Hx   . Stroke Neg Hx     DRUG ALLERGIES:   Allergies  Allergen Reactions  . Metronidazole     Other reaction(s):  Dizziness and giddiness (finding), Other (qualifier value) made things feel like they were vibrating like the bed and floor,also caused mild dizziness weakness  . Penicillin G Rash    REVIEW OF SYSTEMS:   Review of Systems  Unable to perform ROS: Age  Gastrointestinal: Positive for abdominal pain and vomiting.  Musculoskeletal: Positive for back pain.   Severely HOH. MEDICATIONS AT HOME:   Prior to Admission medications   Medication Sig Start Date End Date Taking? Authorizing Provider  apixaban (ELIQUIS) 2.5 MG TABS tablet Take 1 tablet (2.5 mg total) by mouth 2 (two) times daily. 09/23/17  Yes Minna Merritts, MD  carvedilol (COREG) 3.125 MG tablet Take 1 tablet (3.125 mg total) by mouth 2 (two) times daily with a meal. 09/23/17  Yes Gollan, Kathlene November, MD  flecainide (TAMBOCOR) 50 MG tablet Take 1 tablet (50 mg total) by mouth 2 (two) times daily. 09/23/17  Yes Gollan, Kathlene November, MD  magnesium chloride (SLOW-MAG) 64 MG TBEC SR tablet Take 2 tablets (128 mg total) by mouth daily. 12/18/17  Yes [name], MD  omeprazole (PRILOSEC) 40 MG capsule Take 1 capsule (40 mg total) by mouth daily. 05/13/18 07/05/18 Yes [name], MD  rosuvastatin (CRESTOR) 20 MG tablet Take 1 tablet (20 mg total) by mouth daily. Patient taking differently: Take 20 mg by mouth every evening.  09/23/17  Yes Gollan, Kathlene November, MD      VITAL SIGNS:  Blood pressure (!) 161/72, pulse 87, temperature 98.3 F (36.8 C), temperature source Oral, resp. rate 19, height 5\' 4"  (1.626 m), weight 47.2 kg, SpO2 94 %.  PHYSICAL EXAMINATION:  Physical Exam Constitutional:      General: She is awake. She is not in acute distress.    Appearance: Normal appearance. She is well-developed. She is not ill-appearing, toxic-appearing or diaphoretic.     Interventions: She is not intubated. HENT:     Head: Atraumatic.     Right Ear: Decreased hearing noted.     Left Ear: Decreased hearing noted.     Mouth/Throat:      Pharynx: Oropharynx is clear.  Eyes:     General: Lids are normal. No scleral icterus.    Extraocular Movements: Extraocular movements intact.     Conjunctiva/sclera: Conjunctivae normal.  Neck:     Musculoskeletal: Neck supple.  Cardiovascular:     Rate and Rhythm: Normal rate. Rhythm regularly irregular.     Heart sounds: Normal heart sounds, S1 normal and S2 normal. Heart sounds not distant. No murmur. No friction rub. No gallop. No S3 or S4 sounds.   Pulmonary:     Effort: No tachypnea, bradypnea, accessory muscle usage, prolonged expiration, respiratory distress or retractions. She  is not intubated.     Breath sounds: Normal breath sounds and air entry. No stridor, decreased air movement or transmitted upper airway sounds. No decreased breath sounds, wheezing, rhonchi or rales.  Abdominal:     General: Bowel sounds are decreased. There is no distension.     Palpations: Abdomen is soft.     Tenderness: There is no abdominal tenderness. There is no guarding or rebound.     Comments: Non-tender on exam.  Musculoskeletal: Normal range of motion.        General: No swelling or tenderness.     Right lower leg: No edema.     Left lower leg: No edema.  Lymphadenopathy:     Cervical: No cervical adenopathy.  Skin:    General: Skin is warm and dry.     Findings: No erythema or rash.  Neurological:     Mental Status: She is alert. Mental status is at baseline.     Comments: (+) HOH.  Psychiatric:        Attention and Perception: Attention normal.        Mood and Affect: Mood and affect normal.        Behavior: Behavior normal. Behavior is cooperative.        Thought Content: Thought content normal.        Cognition and Memory: Cognition and memory normal.        Judgment: Judgment normal.     Comments: (+) HOH.    LABORATORY PANEL:   CBC Recent Labs  Lab 07/05/18 1655  WBC 15.0*  HGB 12.1  HCT 36.1  PLT 277    ------------------------------------------------------------------------------------------------------------------  Chemistries  Recent Labs  Lab 07/05/18 1655  NA 136  K 4.1  CL 105  CO2 24  GLUCOSE 133*  BUN 18  CREATININE 0.92  CALCIUM 9.3  MG 1.9  AST 42*  ALT 20  ALKPHOS 37*  BILITOT 0.9   ------------------------------------------------------------------------------------------------------------------  Cardiac Enzymes Recent Labs  Lab 07/05/18 1655  TROPONINI <0.03   ------------------------------------------------------------------------------------------------------------------  RADIOLOGY:  Dg Chest 2 View  Result Date: 07/05/2018 CLINICAL DATA:  Acute chest pain. EXAM: CHEST - 2 VIEW COMPARISON:  10/31/2017 and prior radiographs FINDINGS: Cardiomegaly again noted. LOWER lobe opacity on the LATERAL view may represent atelectasis or airspace disease/pneumonia. There is no evidence of pulmonary edema, suspicious pulmonary nodule/mass, pleural effusion, or pneumothorax. No acute bony abnormalities are identified. A severe thoracolumbar scoliosis is again noted. IMPRESSION: LOWER lobe opacity on the LATERAL view which may represent atelectasis or pneumonia. Cardiomegaly Electronically Signed   By: Margarette Canada M.D.   On: 07/05/2018 17:36   Ct Angio Chest/abd/pel For Dissection W And/or Wo Contrast  Result Date: 07/05/2018 CLINICAL DATA:  Chest pain radiates to the back. EXAM: CT ANGIOGRAPHY CHEST, ABDOMEN AND PELVIS TECHNIQUE: Multidetector CT imaging through the chest, abdomen and pelvis was performed using the standard protocol during bolus administration of intravenous contrast. Multiplanar reconstructed images and MIPs were obtained and reviewed to evaluate the vascular anatomy. CONTRAST:  46mL OMNIPAQUE IOHEXOL 350 MG/ML SOLN COMPARISON:  Abdomen pelvis CT 09/16/2017 FINDINGS: CTA CHEST FINDINGS Cardiovascular: Precontrast imaging shows no hyperdense crescent in the  wall of the thoracic aorta to suggest acute intramural hematoma. Imaging after IV contrast administration shows no dissection of the thoracic aorta. No thoracic aortic aneurysm. There is abdominal aortic atherosclerosis without aneurysm. The heart size is normal. No substantial pericardial effusion. No large central pulmonary embolus in either main pulmonary artery or lobar pulmonary  arteries. Mediastinum/Nodes: No mediastinal lymphadenopathy. There is no hilar lymphadenopathy. The esophagus has normal imaging features. There is no axillary lymphadenopathy. Lungs/Pleura: The central tracheobronchial airways are patent. Basilar atelectasis noted bilaterally. No suspicious nodule or mass. No edema or pleural effusion. Musculoskeletal: No worrisome lytic or sclerotic osseous abnormality. Review of the MIP images confirms the above findings. CTA ABDOMEN AND PELVIS FINDINGS VASCULAR Aorta: Atherosclerotic without aneurysm or dissection. Celiac: calcific plaque at the origin but widely patent. SMA: Proximal calcific plaque without features to suggest flow limiting stenosis Renals: Calcific plaque at the ostium bilaterally with patent renal arteries bilaterally. IMA: Opacifies normally. Inflow: Patent bilaterally. Veins: Not well evaluated due to bolus timing. Review of the MIP images confirms the above findings. NON-VASCULAR Hepatobiliary: Probable perfusion anomaly at the gallbladder fossa. No focal abnormality in the liver parenchyma. Gallbladder is distended with layering stones in the fundus and mural calcification compatible with porcelain gallbladder. Mild intra and extrahepatic biliary duct dilatation evident with common bile duct measuring 10 mm diameter in the head of pancreas. Pancreas: Unremarkable. Spleen: No splenomegaly. No focal mass lesion. Adrenals/Urinary Tract: No adrenal nodule or mass. Cortical thinning noted both kidneys without hydronephrosis or suspicious mass lesion. No evidence for hydroureter.  The urinary bladder appears normal for the degree of distention. Stomach/Bowel: Small hiatal hernia. Prominent fold thickness likely related to underdistention. Duodenum is normally positioned as is the ligament of Treitz. Duodenal diverticuli evident. No small bowel wall thickening. No small bowel dilatation. The terminal ileum is normal. The appendix is normal. No gross colonic mass. No colonic wall thickening. Diverticular changes are noted in the left colon without evidence of diverticulitis. Lymphatic: There is no gastrohepatic or hepatoduodenal ligament lymphadenopathy. No intraperitoneal or retroperitoneal lymphadenopathy. No pelvic sidewall lymphadenopathy. Reproductive: The uterus is unremarkable.  There is no adnexal mass. Other: No intraperitoneal free fluid. Musculoskeletal: old right superior and inferior pubic rami fractures noted. Bones diffusely demineralized. Thoracolumbar scoliosis evident. Review of the MIP images confirms the above findings. IMPRESSION: 1. No evidence for thoracoabdominal aortic dissection. No thoracoabdominal aortic aneurysm. No findings to suggest acute intramural hematoma in the thoracic aorta. There is no large central pulmonary embolus. 2. Small hiatal hernia. 3. Cholelithiasis with porcelain gallbladder. 4. Mild intra and extrahepatic biliary duct dilatation. Correlation with liver function test may prove helpful. Electronically Signed   By: Misty Stanley M.D.   On: 07/05/2018 18:24   US Abdomen Limited Ruq  Result Date: 07/05/2018 CLINICAL DATA:  Right upper quadrant pain. EXAM: ULTRASOUND ABDOMEN LIMITED RIGHT UPPER QUADRANT COMPARISON:  CT abdomen 09/16/2017 FINDINGS: Gallbladder: Gallbladder sludge in the gallbladder fundus with tiny cholelithiasis. Hyperechoic gallbladder wall as can be seen with porcelain gallbladder. Gallbladder wall thickening measuring 4.1 mm. Negative sonographic Murphy sign. Common bile duct: Diameter: 7.3 mm Liver: No focal lesion identified.  Within normal limits in parenchymal echogenicity. Mild intrahepatic biliary ductal dilatation. Portal vein is patent on color Doppler imaging with normal direction of blood flow towards the liver. IMPRESSION: Gallbladder sludge with tiny cholelithiasis and mild gallbladder wall thickening. Negative for sonographic Murphy's sign. Findings are equivocal for cholecystitis given lack of pain. If there is further clinical concern, recommend a HIDA scan. Hyperechoic gallbladder wall as can be seen with porcelain gallbladder. Electronically Signed   By: Kathreen Devoid   On: 07/05/2018 19:29   IMPRESSION AND PLAN:   A/P: 39F w/ PMHx Afib (Eliquis) p/w AP/N/V, possible cholecystitis (w/o choledocholithiasis). Hyperglycemia, lipase elevation, mild transaminasemia, leukocytosis, dehydration. -AP/N/V, possible cholecystitis (w/o choledocholithiasis),  lipase elevation, transaminasemia: As per HPI. Lipase 69. AST 42, ALT WNL; DBili 0.3, TBili WNL. CTA C/A/P (+) "Cholelithiasis with porcelain gallbladder. Mild intra and extrahepatic biliary duct dilatation." Imaging result report notes pancreas to be "unremarkable." RUQ U/S (+) "Gallbladder sludge with tiny cholelithiasis and mild gallbladder wall thickening. Negative for sonographic Murphy's sign. Hyperechoic gallbladder wall as can be seen with porcelain gallbladder." Possible cholecystitis, (-) choledocholithiasis. SIRS (-). Advanced age, on Eliquis; poor surgical candidate. Will trial ABx (Zosyn), f/up evaluation for improvement. If pt not improving, may need MRCP, IR vs. GI, etc. Pain ctrl, antiemetics, IVF. -Dehydration: USpGr > 1.046. IVF. -c/w other home meds/formulary subs as tolerated. -FEN/GI: GI soft diet, ADAT. -DVT PPx: Eliquis. -Code status: Full code. -Disposition: Admission, > 2 midnights.   All the records are reviewed and case discussed with ED provider. Management plans discussed with the patient, family and they are in agreement.  CODE STATUS:  Full code.  TOTAL TIME TAKING CARE OF THIS PATIENT: 75 minutes.    Arta Silence M.D on 07/05/2018 at 11:12 PM  Between 7am to 6pm - Pager - [phone]  After 6pm go to [email or web address] - [employer] [facility]  Office  [phone]  CC: Primary care physician; [name], MD   Note: This dictation was prepared with Dragon dictation along with smaller phrase technology. Any transcriptional errors that result from this process are unintentional.

## 2018-07-05 NOTE — ED Notes (Signed)
Patient transported to Ultrasound 

## 2018-07-06 ENCOUNTER — Inpatient Hospital Stay: Payer: Medicare Other

## 2018-07-06 ENCOUNTER — Other Ambulatory Visit: Payer: Self-pay

## 2018-07-06 DIAGNOSIS — R1011 Right upper quadrant pain: Secondary | ICD-10-CM

## 2018-07-06 DIAGNOSIS — K8050 Calculus of bile duct without cholangitis or cholecystitis without obstruction: Secondary | ICD-10-CM

## 2018-07-06 DIAGNOSIS — R945 Abnormal results of liver function studies: Secondary | ICD-10-CM

## 2018-07-06 LAB — BASIC METABOLIC PANEL
Anion gap: 6 (ref 5–15)
BUN: 16 mg/dL (ref 8–23)
CALCIUM: 8.8 mg/dL — AB (ref 8.9–10.3)
CO2: 26 mmol/L (ref 22–32)
Chloride: 105 mmol/L (ref 98–111)
Creatinine, Ser: 0.97 mg/dL (ref 0.44–1.00)
GFR calc Af Amer: 58 mL/min — ABNORMAL LOW (ref >60)
GFR calc non Af Amer: 50 mL/min — ABNORMAL LOW (ref >60)
Glucose, Bld: 124 mg/dL — ABNORMAL HIGH (ref 70–99)
Potassium: 3.9 mmol/L (ref 3.5–5.1)
Sodium: 137 mmol/L (ref 135–145)

## 2018-07-06 LAB — HEPATIC FUNCTION PANEL
ALBUMIN: 3.3 g/dL — AB (ref 3.5–5.0)
ALK PHOS: 58 U/L (ref 38–126)
ALT: 367 U/L — ABNORMAL HIGH (ref 0–44)
AST: 615 U/L — ABNORMAL HIGH (ref 15–41)
BILIRUBIN TOTAL: 1.3 mg/dL — AB (ref 0.3–1.2)
Bilirubin, Direct: 0.6 mg/dL — ABNORMAL HIGH (ref 0.0–0.2)
Indirect Bilirubin: 0.7 mg/dL (ref 0.3–0.9)
Total Protein: 6 g/dL — ABNORMAL LOW (ref 6.5–8.1)

## 2018-07-06 LAB — LIPASE, BLOOD: Lipase: 36 U/L (ref 11–51)

## 2018-07-06 MED ORDER — SODIUM CHLORIDE 0.9 % IV SOLN
INTRAVENOUS | Status: DC | PRN
  Administered 2018-07-06: 21:00:00 30 mL via INTRAVENOUS

## 2018-07-06 MED ORDER — HEPARIN (PORCINE) 25000 UT/250ML-% IV SOLN
600.0000 [IU]/h | INTRAVENOUS | Status: DC
  Administered 2018-07-06: 700 [IU]/h via INTRAVENOUS
  Filled 2018-07-06: qty 250

## 2018-07-06 MED ORDER — GADOBUTROL 1 MMOL/ML IV SOLN
4.0000 mL | Freq: Once | INTRAVENOUS | Status: AC | PRN
  Administered 2018-07-06: 15:00:00 4 mL via INTRAVENOUS

## 2018-07-06 NOTE — Consult Note (Signed)
Cephas Darby, MD [address]  [place]  [place], [place] [address]  Main: [phone]  Fax: [phone] Pager: [phone]   Consultation  Referring Provider:     No ref. provider found Primary Care Physician:  [name], MD Primary Gastroenterologist:  Dr. Sherri Sear         Reason for Consultation:     Evaluate for ERCP  Date of Admission:  07/05/2018 Date of Consultation:  07/06/2018         HPI:   Kylie May is a 85 y.o. pleasant, white female with h/o A Fib on eliquis admitted with one day h/o acute RUQ pain a/w nausea and emesis, radiating to back started yesterday afternoon. Pt has significant hard of hearing, her daughter helped with history. I saw her in my office last year for dysphagia. She is on omeprazole daily. In ER, she underwent CT Angio Chest/abd/pelvis as she initially c/o chest pain to r/o aortic dissection which was negative. This revealed Gallbladder is distended with layering stones in the fundus and mural calcification compatible with porcelain gallbladder. Mild intra and extrahepatic biliary duct dilatation evident with common bile duct measuring 23m diameter in the head of pancreas. Her pain was transient, [place]Q had negative murphy's sign. Her LFTs were mildly elevated initially. Today, she has worsening of LFTs AST 615, ALT 367, AP 58, TBili 1.3, D Bili 0.6. Therefore, GI is consulted to evaluate for ERCP. She underwent MRCP Also, started on zosyn due to elevated WBC  Pt has been eating soft food and pain free. Her daughter and rest of the family are bedside. Pt's daughter reported that she had ERCP about 10years ago and stent was placed for stone extraction, She did not undergo cholecystectomy at that time due to her age  NSAIDs: none  Antiplts/Anticoagulants/Anti thrombotics: eliquis for A Fib  GI Procedures: none  Past Medical History:  Diagnosis Date  . [place]   . Acid reflux disease   . Arrhythmia   . Chronic  diastolic CHF (congestive heart failure) ([place]   . [place] vertebra [place]   . Diverticulitis 01-2008   [facility] [place]  . Diverticulosis   . Esophageal stricture 2006  . Femoral bruit    Right  . Fibrocystic breast disease   . Hiatal hernia   . Hypercholesterolemia    [place] study LDL goal = < 160  . Mitral regurgitation    a. 06/2014 EF 55-60%, elevated end-diastolic pressures, dilated LA at 4.3 cm, mildly dilated RA, severe mitral regurgitation, mild aortic sclerosis without stenosis, mod-severe TR  . Mitral valve prolapse   . Osteoporosis    Dr HMatthew Saras . Pancreatitis 07-2008   Hospitalized   . Schatzki's ring   . Skin cancer    facial x 2. Dr DEvorn Gong   Past Surgical History:  Procedure Laterality Date  . COLONOSCOPY  2006  . Epidural steroids     x1  . ESOPHAGEAL DILATION  2006  . G3 P4 (twins)    . SKIN CANCER EXCISION      Prior to Admission medications   Medication Sig Start Date End Date Taking? Authorizing Provider  apixaban (ELIQUIS) 2.5 MG TABS tablet Take 1 tablet (2.5 mg total) by mouth 2 (two) times daily. 09/23/17  Yes GMinna Merritts MD  carvedilol (COREG) 3.125 MG tablet Take 1 tablet (3.125 mg total) by mouth 2 (two) times daily with a meal. 09/23/17  Yes  Minna Merritts, MD  flecainide (TAMBOCOR) 50 MG tablet Take 1 tablet (50 mg total) by mouth 2 (two) times daily. 09/23/17  Yes Gollan, Kathlene November, MD  magnesium chloride (SLOW-MAG) 64 MG TBEC SR tablet Take 2 tablets (128 mg total) by mouth daily. 12/18/17  Yes [name], MD  omeprazole (PRILOSEC) 40 MG capsule Take 1 capsule (40 mg total) by mouth daily. 05/13/18 07/05/18 Yes [name], MD  rosuvastatin (CRESTOR) 20 MG tablet Take 1 tablet (20 mg total) by mouth daily. Patient taking differently: Take 20 mg by mouth every evening.  09/23/17  Yes Gollan, Kathlene November, MD    Current Facility-Administered Medications:  .  0.9 %  sodium chloride infusion, , Intravenous,  PRN, Mayo, Pete Pelt, MD, Last Rate: 10 mL/hr at 07/06/18 2124, 30 mL at 07/06/18 2124 .  acetaminophen (TYLENOL) tablet 650 mg, 650 mg, Oral, Q6H PRN, 650 mg at 07/06/18 0100 **OR** acetaminophen (TYLENOL) suppository 650 mg, 650 mg, Rectal, Q6H PRN, Jodell Cipro, Prasanna, MD .  bisacodyl (DULCOLAX) EC tablet 5 mg, 5 mg, Oral, Daily PRN, Jodell Cipro, Prasanna, MD .  carvedilol (COREG) tablet 3.125 mg, 3.125 mg, Oral, BID WC, Sridharan, Prasanna, MD, 3.125 mg at 07/06/18 1759 .  flecainide (TAMBOCOR) tablet 50 mg, 50 mg, Oral, BID, Sridharan, Prasanna, MD, 50 mg at 07/06/18 2126 .  heparin ADULT infusion 100 units/mL (25000 units/239m sodium chloride 0.45%), 700 Units/hr, Intravenous, Continuous, Mayo, KPete Pelt MD, Last Rate: 7 mL/hr at 07/06/18 2122, 700 Units/hr at 07/06/18 2122 .  magnesium chloride (SLOW-MAG) 64 MG SR tablet 128 mg, 2 tablet, Oral, Daily, SJodell Cipro Prasanna, MD, 128 mg at 07/06/18 0930 .  morphine 2 MG/ML injection 2 mg, 2 mg, Intravenous, Q3H PRN **OR** morphine 4 MG/ML injection 4 mg, 4 mg, Intravenous, Q3H PRN, SJodell Cipro Prasanna, MD .  ondansetron (ZOFRAN) tablet 4 mg, 4 mg, Oral, Q6H PRN **OR** ondansetron (ZOFRAN) injection 4 mg, 4 mg, Intravenous, Q6H PRN, SArta Silence MD, 4 mg at 07/05/18 2342 .  pantoprazole (PROTONIX) EC tablet 40 mg, 40 mg, Oral, Daily, SJodell Cipro Prasanna, MD, 40 mg at 07/06/18 0930 .  piperacillin-tazobactam (ZOSYN) IVPB 3.375 g, 3.375 g, Intravenous, Q8H, Shanlever, CPierce Crane [facility] Last Rate: 12.5 mL/hr at 07/06/18 2125, 3.375 g at 07/06/18 2125 .  rosuvastatin (CRESTOR) tablet 20 mg, 20 mg, Oral, QPM, Sridharan, Prasanna, MD, 20 mg at 07/06/18 1759 .  senna-docusate (Senokot-S) tablet 1 tablet, 1 tablet, Oral, QHS PRN, SArta Silence MD  Family History  Problem Relation Age of Onset  . Breast cancer Maternal Aunt   . Hepatitis Daughter        C  . Lung disease Mother        lung tumor  . Heart attack Daughter 575      S/P  stents  . Heart attack Sister 861 . Diabetes Neg Hx   . Stroke Neg Hx      Social History   Tobacco Use  . Smoking status: Former Smoker    Last attempt to quit: 06/04/1951    Years since quitting: 67.1  . Smokeless tobacco: Never Used  . Tobacco comment: Smoked 1945-1953 , up to 2 cigarettes/day  Substance Use Topics  . Alcohol use: Yes    Alcohol/week: 7.0 standard drinks    Types: 7 Glasses of wine per week  . Drug use: No    Allergies as of 07/05/2018 - Review Complete 07/05/2018  Allergen Reaction Noted  . Metronidazole  11/11/2006  .  Penicillin g Rash 08/11/2015    Review of Systems:    All systems reviewed and negative except where noted in HPI.   Physical Exam:  Vital signs in last 24 hours: Temp:  [97.7 F (36.5 C)-100.1 F (37.8 C)] 97.8 F (36.6 C) (02/03 2048) Pulse Rate:  [72-79] 75 (02/03 2048) Resp:  [17-18] 18 (02/03 2048) BP: (88-117)/(49-60) 103/59 (02/03 2048) SpO2:  [94 %-96 %] 94 % (02/03 2048) Last BM Date: 07/05/18 General:   Pleasant, cooperative in NAD Head:  Normocephalic and atraumatic. Eyes:   No icterus.   Conjunctiva pink. PERRLA. Ears:  Decreased auditory acuity. Neck:  Supple; no masses or thyroidomegaly Lungs: Respirations even and unlabored. Lungs clear to auscultation bilaterally.   No wheezes, crackles, or rhonchi.  Heart:  Regular rate and rhythm;  Without murmur, clicks, rubs or gallops Abdomen:  Soft, nondistended, nontender. Normal bowel sounds. No appreciable masses or hepatomegaly.  No rebound or guarding.  Rectal:  Not performed. Msk:  Symmetrical without gross deformities.  Strength good for her age  Extremities:  Without edema, cyanosis or clubbing. Neurologic:  Alert and oriented x3;  grossly normal neurologically. Skin:  Intact without significant lesions or rashes. Psych:  Alert and cooperative. Normal affect.  LAB RESULTS: CBC Latest Ref Rng & Units 07/05/2018 07/16/2017 01/14/2017  WBC 4.0 - 10.5 K/uL 15.0(H) 7.8  11.2(H)  Hemoglobin 12.0 - 15.0 g/dL 12.1 13.3 13.2  Hematocrit 36.0 - 46.0 % 36.1 39.2 40.2  Platelets 150 - 400 K/uL 277 304.0 297.0    BMET BMP Latest Ref Rng & Units 07/06/2018 07/05/2018 09/16/2017  Glucose 70 - 99 mg/dL 124(H) 133(H) -  BUN 8 - 23 mg/dL 16 18 -  Creatinine 0.44 - 1.00 mg/dL 0.97 0.92 1.00  BUN/Creat Ratio 11 - 26 - - -  Sodium 135 - 145 mmol/L 137 136 -  Potassium 3.5 - 5.1 mmol/L 3.9 4.1 -  Chloride 98 - 111 mmol/L 105 105 -  CO2 22 - 32 mmol/L 26 24 -  Calcium 8.9 - 10.3 mg/dL 8.8(L) 9.3 -    LFT Hepatic Function Latest Ref Rng & Units 07/06/2018 07/05/2018 07/16/2017  Total Protein 6.5 - 8.1 g/dL 6.0(L) 6.6 7.2  Albumin 3.5 - 5.0 g/dL 3.3(L) 4.0 4.1  AST 15 - 41 U/L 615(H) 42(H) 19  ALT 0 - 44 U/L 367(H) 20 10  Alk Phosphatase 38 - 126 U/L 58 37(L) 42  Total Bilirubin 0.3 - 1.2 mg/dL 1.3(H) 0.9 0.4  Bilirubin, Direct 0.0 - 0.2 mg/dL 0.6(H) 0.3(H) -     STUDIES: Dg Chest 2 View  Result Date: 07/05/2018 CLINICAL DATA:  Acute chest pain. EXAM: CHEST - 2 VIEW COMPARISON:  10/31/2017 and prior radiographs FINDINGS: Cardiomegaly again noted. LOWER lobe opacity on the LATERAL view may represent atelectasis or airspace disease/pneumonia. There is no evidence of pulmonary edema, suspicious pulmonary nodule/mass, pleural effusion, or pneumothorax. No acute bony abnormalities are identified. A severe thoracolumbar scoliosis is again noted. IMPRESSION: LOWER lobe opacity on the LATERAL view which may represent atelectasis or pneumonia. Cardiomegaly Electronically Signed   By: Margarette Canada M.D.   On: 07/05/2018 17:36   Mr Abdomen With Mrcp W Contrast  Result Date: 07/06/2018 CLINICAL DATA:  Acute onset of right upper quadrant abdominal pain yesterday. Nausea and vomiting. EXAM: MRI ABDOMEN WITH CONTRAST (WITH MRCP) TECHNIQUE: Multiplanar multisequence MR imaging of the abdomen was performed following the administration of intravenous contrast. Heavily T2-weighted images of the  biliary and pancreatic ducts  were obtained, and three-dimensional MRCP images were rendered by post processing. CONTRAST:  4 cc Gadavist COMPARISON:  Abdominal ultrasound 07/05/2018. Abdominal CT 09/16/2017. FINDINGS: Lower chest:  The visualized lower chest appears unremarkable. Hepatobiliary: The liver is normal in signal without steatosis, focal lesion or abnormal enhancement. Multiple small gallstones are present within the gallbladder lumen. There is no significant gallbladder wall thickening. The common hepatic duct measures 8 mm in diameter. No evidence of choledocholithiasis. There is mild intrahepatic biliary dilatation as well. Pancreas: Unremarkable. No pancreatic ductal dilatation or surrounding inflammatory change. No pancreas divisum. Spleen: Normal in size without focal abnormality. Adrenals/Urinary Tract: Both adrenal glands appear normal. Both kidneys appear normal. No evidence of hydronephrosis or renal mass. Stomach/Bowel: No evidence of bowel wall thickening, distention or surrounding inflammatory change.There are scattered diverticular changes within the colon. The stomach is mildly distended with wall thickening proximally, similar to previous CT. Vascular/Lymphatic: There are no enlarged abdominal lymph nodes. No significant vascular findings. Other: No ascites.  The anterior abdominal wall appears intact. Musculoskeletal: No acute or significant osseous findings. Severe convex left thoracolumbar scoliosis with associated spondylosis. Chronic L1 compression deformity appears unchanged. IMPRESSION: 1. Cholelithiasis without evidence of cholecystitis. 2. Mild intrahepatic and extrahepatic biliary dilatation, similar to previous CT. No evidence of choledocholithiasis. 3. Chronic gastric wall thickening. 4. Severe convex left scoliosis. Electronically Signed   By: Richardean Sale M.D.   On: 07/06/2018 15:28   Ct Angio Chest/abd/pel For Dissection W And/or Wo Contrast  Result Date:  07/05/2018 CLINICAL DATA:  Chest pain radiates to the back. EXAM: CT ANGIOGRAPHY CHEST, ABDOMEN AND PELVIS TECHNIQUE: Multidetector CT imaging through the chest, abdomen and pelvis was performed using the standard protocol during bolus administration of intravenous contrast. Multiplanar reconstructed images and MIPs were obtained and reviewed to evaluate the vascular anatomy. CONTRAST:  38m OMNIPAQUE IOHEXOL 350 MG/ML SOLN COMPARISON:  Abdomen pelvis CT 09/16/2017 FINDINGS: CTA CHEST FINDINGS Cardiovascular: Precontrast imaging shows no hyperdense crescent in the wall of the thoracic aorta to suggest acute intramural hematoma. Imaging after IV contrast administration shows no dissection of the thoracic aorta. No thoracic aortic aneurysm. There is abdominal aortic atherosclerosis without aneurysm. The heart size is normal. No substantial pericardial effusion. No large central pulmonary embolus in either main pulmonary artery or lobar pulmonary arteries. Mediastinum/Nodes: No mediastinal lymphadenopathy. There is no hilar lymphadenopathy. The esophagus has normal imaging features. There is no axillary lymphadenopathy. Lungs/Pleura: The central tracheobronchial airways are patent. Basilar atelectasis noted bilaterally. No suspicious nodule or mass. No edema or pleural effusion. Musculoskeletal: No worrisome lytic or sclerotic osseous abnormality. Review of the MIP images confirms the above findings. CTA ABDOMEN AND PELVIS FINDINGS VASCULAR Aorta: Atherosclerotic without aneurysm or dissection. Celiac: calcific plaque at the origin but widely patent. SMA: Proximal calcific plaque without features to suggest flow limiting stenosis Renals: Calcific plaque at the ostium bilaterally with patent renal arteries bilaterally. IMA: Opacifies normally. Inflow: Patent bilaterally. Veins: Not well evaluated due to bolus timing. Review of the MIP images confirms the above findings. NON-VASCULAR Hepatobiliary: Probable perfusion  anomaly at the gallbladder fossa. No focal abnormality in the liver parenchyma. Gallbladder is distended with layering stones in the fundus and mural calcification compatible with porcelain gallbladder. Mild intra and extrahepatic biliary duct dilatation evident with common bile duct measuring 10 mm diameter in the head of pancreas. Pancreas: Unremarkable. Spleen: No splenomegaly. No focal mass lesion. Adrenals/Urinary Tract: No adrenal nodule or mass. Cortical thinning noted both kidneys without hydronephrosis or suspicious mass  lesion. No evidence for hydroureter. The urinary bladder appears normal for the degree of distention. Stomach/Bowel: Small hiatal hernia. Prominent fold thickness likely related to underdistention. Duodenum is normally positioned as is the ligament of Treitz. Duodenal diverticuli evident. No small bowel wall thickening. No small bowel dilatation. The terminal ileum is normal. The appendix is normal. No gross colonic mass. No colonic wall thickening. Diverticular changes are noted in the left colon without evidence of diverticulitis. Lymphatic: There is no gastrohepatic or hepatoduodenal ligament lymphadenopathy. No intraperitoneal or retroperitoneal lymphadenopathy. No pelvic sidewall lymphadenopathy. Reproductive: The uterus is unremarkable.  There is no adnexal mass. Other: No intraperitoneal free fluid. Musculoskeletal: old right superior and inferior pubic rami fractures noted. Bones diffusely demineralized. Thoracolumbar scoliosis evident. Review of the MIP images confirms the above findings. IMPRESSION: 1. No evidence for thoracoabdominal aortic dissection. No thoracoabdominal aortic aneurysm. No findings to suggest acute intramural hematoma in the thoracic aorta. There is no large central pulmonary embolus. 2. Small hiatal hernia. 3. Cholelithiasis with porcelain gallbladder. 4. Mild intra and extrahepatic biliary duct dilatation. Correlation with liver function test may prove  helpful. Electronically Signed   By: Misty Stanley M.D.   On: 07/05/2018 18:24   US Abdomen Limited Ruq  Result Date: 07/05/2018 CLINICAL DATA:  Right upper quadrant pain. EXAM: ULTRASOUND ABDOMEN LIMITED RIGHT UPPER QUADRANT COMPARISON:  CT abdomen 09/16/2017 FINDINGS: Gallbladder: Gallbladder sludge in the gallbladder fundus with tiny cholelithiasis. Hyperechoic gallbladder wall as can be seen with porcelain gallbladder. Gallbladder wall thickening measuring 4.1 mm. Negative sonographic Murphy sign. Common bile duct: Diameter: 7.3 mm Liver: No focal lesion identified. Within normal limits in parenchymal echogenicity. Mild intrahepatic biliary ductal dilatation. Portal vein is patent on color Doppler imaging with normal direction of blood flow towards the liver. IMPRESSION: Gallbladder sludge with tiny cholelithiasis and mild gallbladder wall thickening. Negative for sonographic Murphy's sign. Findings are equivocal for cholecystitis given lack of pain. If there is further clinical concern, recommend a HIDA scan. Hyperechoic gallbladder wall as can be seen with porcelain gallbladder. Electronically Signed   By: Kathreen Devoid   On: 07/05/2018 19:29      Impression / Plan:   Kylie May is a 85 y.o. white female with Afib on eliquis, presented with symptomatic cholelithiasis, worsening transaminases, MRCP revealed 1. Cholelithiasis without evidence of cholecystitis. 2. Mild intrahepatic and extrahepatic biliary dilatation, similar to previous CT. No evidence of choledocholithiasis. 3. Chronic gastric wall thickening. 4. Severe convex left scoliosis.  Pt may have passed the stone, monitor daily LFTs, if predominantly hepatocellular pattern, r/o alternative etiology. If LFTs worsening will consider ERCP, Pt had choledocholithiasis in the past and underwent ERCP at that time, this is second episode after 10years. Dr Davis Gourd recommends against surgical intervention given her age and  comorbidities  Thank you for involving me in the care of this patient.      LOS: 1 day   Sherri Sear, MD  07/06/2018, 11:15 PM   Note: This dictation was prepared with Dragon dictation along with smaller phrase technology. Any transcriptional errors that result from this process are unintentional.

## 2018-07-06 NOTE — Progress Notes (Signed)
   07/06/18 1500  Clinical Encounter Type  Visited With Patient  Visit Type Initial  Spiritual Encounters  Spiritual Needs Prayer  Ch made a visit during rounds. Pt was alert but had difficulty hearing which made it challenging for her to understand ch. Pt shared her plans with her grandchildren that didn't come to be realized because of her hospitalization. Pt was in good spirit. Ch prayed for healing.

## 2018-07-06 NOTE — Progress Notes (Signed)
PT Cancellation Note  Patient Details Name: Kylie May MRN: [id number] DOB: 02-03-1922   Cancelled Treatment:    Reason Eval/Treat Not Completed: Patient at procedure or test/unavailable. Patient down for MRI. Will re-attempt at later time if appropriate.    Mussa Groesbeck 07/06/2018, 2:10 PM

## 2018-07-06 NOTE — Consult Note (Addendum)
[facility] SURGICAL CONSULTATION NOTE (initial) - cpt: 16109   HISTORY OF PRESENT ILLNESS (HPI):  85 y.o. female presented to [facility] ED 07/05/2018 for evaluation of abdominal pain. Patient's daughter and grandson at bedside help contribute to history. She developed acute onset of RUQ pain around 2pm yesterday which radiated to her back. She endorses associated nausea and emesis with the pain. Denied any fevers, chills, CP, SOB, or bladder/bowel changes since the onset of the pain. She is uncertain of similar pain in the past. She first attributed this to her GERD however the pain persisted despite taking her PPI. She denied any previous abdominal surgical history. Work up in the ED was concerning for cholelithiasis with porcelain gallbladder and biliary ductal dilation.   Today, she reports that her pain has significantly improved and she no longer is having any nausea or emesis. No other additional complaints. Again, family at bedside contributes to history given the patient being hard of hearing. Family reports that she lives independently and can preform all ADLs. She is able to ambulate at leas 1-2 blocks and up stairs nightly without SOB.    Surgery is consulted by hospitalist physician Dr. Brett Albino, MD in this context for evaluation and management of cholelithiasis, porcelain gallbladder, and bilary dilation.   PAST MEDICAL HISTORY (PMH):  Past Medical History:  Diagnosis Date  . A-fib ([place])   . Acid reflux disease   . Arrhythmia   . Chronic diastolic CHF (congestive heart failure) ([place])   . Compression fracture of lumbar vertebra ([place])   . Diverticulitis 01-2008   GI [place]  . Diverticulosis   . Esophageal stricture 2006  . Femoral bruit    Right  . Fibrocystic breast disease   . Hiatal hernia   . Hypercholesterolemia    [place] study LDL goal = < 160  . Mitral regurgitation    a. 06/2014 EF 55-60%, elevated end-diastolic pressures, dilated LA at 4.3 cm, mildly  dilated RA, severe mitral regurgitation, mild aortic sclerosis without stenosis, mod-severe TR  . Mitral valve prolapse   . Osteoporosis    Dr Matthew Saras  . Pancreatitis 07-2008   Hospitalized   . Schatzki's ring   . Skin cancer    facial x 2. [name]     PAST SURGICAL HISTORY [facility]):  Past Surgical History:  Procedure Laterality Date  . COLONOSCOPY  2006  . Epidural steroids     x1  . ESOPHAGEAL DILATION  2006  . G3 P4 (twins)    . SKIN CANCER EXCISION       MEDICATIONS:  Prior to Admission medications   Medication Sig Start Date End Date Taking? Authorizing Provider  apixaban (ELIQUIS) 2.5 MG TABS tablet Take 1 tablet (2.5 mg total) by mouth 2 (two) times daily. 09/23/17  Yes Minna Merritts, MD  carvedilol (COREG) 3.125 MG tablet Take 1 tablet (3.125 mg total) by mouth 2 (two) times daily with a meal. 09/23/17  Yes Gollan, Kathlene November, MD  flecainide (TAMBOCOR) 50 MG tablet Take 1 tablet (50 mg total) by mouth 2 (two) times daily. 09/23/17  Yes Gollan, Kathlene November, MD  magnesium chloride (SLOW-MAG) 64 MG TBEC SR tablet Take 2 tablets (128 mg total) by mouth daily. 12/18/17  Yes [name], MD  omeprazole (PRILOSEC) 40 MG capsule Take 1 capsule (40 mg total) by mouth daily. 05/13/18 07/05/18 Yes [name], MD  rosuvastatin (CRESTOR) 20 MG tablet Take 1 tablet (20 mg total) by mouth daily. Patient taking  differently: Take 20 mg by mouth every evening.  09/23/17  Yes Gollan, Kathlene November, MD     ALLERGIES:  Allergies  Allergen Reactions  . Metronidazole     Other reaction(s): Dizziness and giddiness (finding), Other (qualifier value) made things feel like they were vibrating like the bed and floor,also caused mild dizziness weakness  . Penicillin G Rash     SOCIAL HISTORY:  Social History   Socioeconomic History  . Marital status: Widowed    Spouse name: Not on file  . Number of children: 3  . Years of education: Not on file  . Highest education level: Not on  file  Occupational History  . 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 Financial resource strain: Not hard at all  . Food insecurity:    Worry: Never true    Inability: Never true  . Transportation needs:    Medical: No    Non-medical: No  Tobacco Use  . Smoking status: Former Smoker    Last attempt to quit: 06/04/1951    Years since quitting: 67.1  . Smokeless tobacco: Never Used  . Tobacco comment: Smoked 1945-1953 , up to 2 cigarettes/day  Substance and Sexual Activity  . Alcohol use: Yes    Alcohol/week: 7.0 standard drinks    Types: 7 Glasses of wine per week  . Drug use: No  . Sexual activity: Not Currently  Lifestyle  . Physical activity:    Days per week: 2 days    Minutes per session: 10 min  . Stress: Not at all  Relationships  . Social connections:    Talks on phone: More than three times a week    Gets together: More than three times a week    Attends religious service: More than 4 times per year    Active member of club or organization: Yes    Attends meetings of clubs or organizations: 1 to 4 times per year    Relationship status: Widowed  . Intimate partner violence:    Fear of current or ex partner: No    Emotionally abused: No    Physically abused: No    Forced sexual activity: No  Other Topics Concern  . Not on file  Social History Narrative   Has living will    Would want daughter Santiago Glad to be health care POA   Not sure about DNR--definitely doesn't want prolonged life support   Not sure about tube feeds either (but probably not)           FAMILY HISTORY:  Family History  Problem Relation Age of Onset  . Breast cancer Maternal Aunt   . Hepatitis Daughter        C  . Lung disease Mother        lung tumor  . Heart attack Daughter 63       S/P stents  . Heart attack Sister 34  . Diabetes Neg Hx   . Stroke Neg Hx       REVIEW OF SYSTEMS:  Review of Systems   Constitutional: Negative for chills and fever.  Respiratory: Negative for cough and shortness of breath.   Cardiovascular: Negative for chest pain and palpitations.  Gastrointestinal: Positive for abdominal pain, nausea and vomiting. Negative for blood in stool, constipation and diarrhea.  Genitourinary: Negative for dysuria and urgency.  Neurological: Negative for dizziness and headaches.  All other systems reviewed and are negative.  VITAL SIGNS:  Temp:  [97.7 F (36.5 C)-100.1 F (37.8 C)] 98.2 F (36.8 C) (02/03 0756) Pulse Rate:  [56-87] 72 (02/03 0756) Resp:  [14-25] 18 (02/03 0756) BP: (88-161)/(49-106) 92/52 (02/03 0756) SpO2:  [93 %-100 %] 95 % (02/03 0756) Weight:  [47.2 kg] 47.2 kg (02/02 1648)     Height: 5\' 4"  (162.6 cm) Weight: 47.2 kg BMI (Calculated): 17.84   INTAKE/OUTPUT:  This shift: Total I/O In: 360 [P.O.:360] Out: -   Last 2 shifts: @IOLAST2SHIFTS @   PHYSICAL EXAM:  Physical Exam Constitutional:      General: She is not in acute distress.    Appearance: She is well-developed. She is not ill-appearing.  HENT:     Head: Normocephalic and atraumatic.     Right Ear: Decreased hearing noted.     Left Ear: Decreased hearing noted.  Eyes:     General: No scleral icterus.    Extraocular Movements: Extraocular movements intact.     Conjunctiva/sclera: Conjunctivae normal.  Cardiovascular:     Rate and Rhythm: Rhythm irregularly irregular.     Pulses: Normal pulses.          Dorsalis pedis pulses are 2+ on the right side and 2+ on the left side.       Posterior tibial pulses are 2+ on the right side and 2+ on the left side.     Heart sounds: No murmur.  Pulmonary:     Effort: Pulmonary effort is normal. No respiratory distress.     Breath sounds: Normal breath sounds. No wheezing or rhonchi.  Abdominal:     General: Abdomen is flat. There is no distension.     Tenderness: There is no abdominal tenderness. There is no guarding or rebound. Negative signs  include Murphy's sign.     Hernia: No hernia is present.  Genitourinary:    Comments: Deferred Musculoskeletal:     Right lower leg: No edema.     Left lower leg: No edema.  Skin:    General: Skin is warm and dry.  Neurological:     General: No focal deficit present.     Mental Status: She is alert and oriented to person, place, and time.  Psychiatric:        Mood and Affect: Mood normal.        Behavior: Behavior normal.       Labs:  CBC Latest Ref Rng & Units 07/05/2018 07/16/2017 01/14/2017  WBC 4.0 - 10.5 K/uL 15.0(H) 7.8 11.2(H)  Hemoglobin 12.0 - 15.0 g/dL 12.1 13.3 13.2  Hematocrit 36.0 - 46.0 % 36.1 39.2 40.2  Platelets 150 - 400 K/uL 277 304.0 297.0   CMP Latest Ref Rng & Units 07/06/2018 07/05/2018 09/16/2017  Glucose 70 - 99 mg/dL 124(H) 133(H) -  BUN 8 - 23 mg/dL 16 18 -  Creatinine 0.44 - 1.00 mg/dL 0.97 0.92 1.00  Sodium 135 - 145 mmol/L 137 136 -  Potassium 3.5 - 5.1 mmol/L 3.9 4.1 -  Chloride 98 - 111 mmol/L 105 105 -  CO2 22 - 32 mmol/L 26 24 -  Calcium 8.9 - 10.3 mg/dL 8.8(L) 9.3 -  Total Protein 6.5 - 8.1 g/dL - 6.6 -  Total Bilirubin 0.3 - 1.2 mg/dL - 0.9 -  Alkaline Phos 38 - 126 U/L - 37(L) -  AST 15 - 41 U/L - 42(H) -  ALT 0 - 44 U/L - 20 -    Imaging studies:   CTA Chest/Abdomen/Pelvis (07/05/2018) personally reviewed and  radiologist report reviewed:  IMPRESSION: 1. No evidence for thoracoabdominal aortic dissection. No thoracoabdominal aortic aneurysm. No findings to suggest acute intramural hematoma in the thoracic aorta. There is no large central pulmonary embolus. 2. Small hiatal hernia. 3. Cholelithiasis with porcelain gallbladder. 4. Mild intra and extrahepatic biliary duct dilatation. Correlation with liver function test may prove helpful.  RUQ [place] (07/05/2018) personally reviewed and radiologist report reviewed:  IMPRESSION: 1) Gallbladder sludge with tiny cholelithiasis and mild gallbladder wall thickening. Negative for sonographic  Murphy's sign. Findings are equivocal for cholecystitis given lack of pain. If there is further clinical concern, recommend a HIDA scan. 2) Hyperechoic gallbladder wall as can be seen with porcelain gallbladder.  Assessment/Plan: (ICD-10's: K73.86) 85 y.o. female with RUQ pain and newly elevated LFTs/Bilirubin in the setting of cholelithiasis, concern for porcelain gallbladder, and biliary ductal dilation, complicated by pertinent comorbidities including atrial fibrillation on Eliquis, GERD, chronic diastolic CHF, mitral regurgitation, HLD, osteoporosis, former tobacco abuse (smoking), and advanced age.   - NPO for now + IVF, IV Abx (Zosyn)   - Will obtain MRCP to evaluate for causes of CBD obstruction including but not limited to retained CBD stone.   - Consult gastroenterology Marius Ditch), appreciate their input  - Continue to monitor abdominal exam, CBC, LFTs  - Will need to have Eliquis held for 48 hours prior to any procedures  - Medical management per primary team.    All of the above findings and recommendations were discussed with the patient and her family, and all of patient's her family's questions were answered to their expressed satisfaction.  Thank you for the opportunity to participate in this patient's care.   -- Edison Simon, PA-C [facility] 07/06/2018, 11:43 AM [phone] M-F: 7am - 4pm

## 2018-07-06 NOTE — Progress Notes (Signed)
ANTICOAGULATION CONSULT NOTE - Initial Consult  Pharmacy Consult for Heparin drip Indication: atrial fibrillation  Allergies  Allergen Reactions  . Metronidazole     Other reaction(s): Dizziness and giddiness (finding), Other (qualifier value) made things feel like they were vibrating like the bed and floor,also caused mild dizziness weakness  . Penicillin G Rash    Patient Measurements: Height: 5\' 4"  (162.6 cm) Weight: 104 lb (47.2 kg) IBW/kg (Calculated) : 54.7 Heparin Dosing Weight: 47. 2 kg  Vital Signs: Temp: 98.2 F (36.8 C) (02/03 0756) Temp Source: Oral (02/03 0756) BP: 92/52 (02/03 0756) Pulse Rate: 72 (02/03 0756)  Labs: Recent Labs    07/05/18 1655 07/06/18 0416  HGB 12.1  --   HCT 36.1  --   PLT 277  --   LABPROT 14.0  --   INR 1.09  --   CREATININE 0.92 0.97  TROPONINI <0.03  --     Estimated Creatinine Clearance: 26.4 mL/min (by C-G formula based on SCr of 0.97 mg/dL).   Medical History: Past Medical History:  Diagnosis Date  . A-fib ([place])   . Acid reflux disease   . Arrhythmia   . Chronic diastolic CHF (congestive heart failure) ([place])   . Compression fracture of lumbar vertebra ([place])   . Diverticulitis 01-2008   GI [place]  . Diverticulosis   . Esophageal stricture 2006  . Femoral bruit    Right  . Fibrocystic breast disease   . Hiatal hernia   . Hypercholesterolemia    [place] study LDL goal = < 160  . Mitral regurgitation    a. 06/2014 EF 55-60%, elevated end-diastolic pressures, dilated LA at 4.3 cm, mildly dilated RA, severe mitral regurgitation, mild aortic sclerosis without stenosis, mod-severe TR  . Mitral valve prolapse   . Osteoporosis    Dr Matthew Saras  . Pancreatitis 07-2008   Hospitalized   . Schatzki's ring   . Skin cancer    facial x 2. [name]    Assessment: Patient is a 85yo female admitted with abdominal pain due to gallbladder disease. Patient on Apixaban prior to admission for afib, last dose given on 2/3 at  09:30. Patient to transition to Heparin drip in preparation for possible surgery.  Goal of Therapy:  Heparin level 0.3-0.7 units/ml aPTT 66-107 seconds Monitor platelets by anticoagulation protocol: Yes   Plan: Will begin Heparin drip 700 units/hr at 21:00 tonight without a bolus. Will check HL and aPTT at 05:00. Will likely need to use aPTT to guide therapy until HL and aPTT correlate. Daily CBC while on Heparin drip.   Paulina Fusi, PharmD, BCPS 07/06/2018 3:08 PM

## 2018-07-06 NOTE — Progress Notes (Signed)
[place] [facility] [place] NAME: Kylie May    MR#:  [id number]  DATE OF BIRTH:  1921/10/16  SUBJECTIVE:   Patient states she is feeling better this morning.  Her right upper quadrant and epigastric abdominal pain not completely resolved.  She denies any nausea or vomiting.  REVIEW OF SYSTEMS:  Review of Systems  Constitutional: Negative for chills and fever.  HENT: Negative for congestion and sore throat.   Eyes: Negative for blurred vision and double vision.  Respiratory: Negative for cough and shortness of breath.   Cardiovascular: Negative for chest pain and palpitations.  Gastrointestinal: Negative for abdominal pain, diarrhea, nausea and vomiting.  Genitourinary: Negative for dysuria and urgency.  Musculoskeletal: Positive for back pain. Negative for myalgias.  Neurological: Negative for dizziness and headaches.  Psychiatric/Behavioral: Negative for depression. The patient is not nervous/anxious.     DRUG ALLERGIES:   Allergies  Allergen Reactions  . Metronidazole     Other reaction(s): Dizziness and giddiness (finding), Other (qualifier value) made things feel like they were vibrating like the bed and floor,also caused mild dizziness weakness  . Penicillin G Rash   VITALS:  Blood pressure (!) 92/52, pulse 72, temperature 98.2 F (36.8 C), temperature source Oral, resp. rate 18, height 5\' 4"  (1.626 m), weight 47.2 kg, SpO2 95 %. PHYSICAL EXAMINATION:  Physical Exam  Constitutional:   Well-appearing, sitting up in bed in no acute distress HEENT: Atraumatic, normocephalic, EOMI, no scleral icterus,+ hard of hearing, moist mucous membranes Neck: Supple, full range of motion, no JVD Cardiovascular: RRR, no murmurs, rubs, gallops Pulmonary: Lungs clear to auscultation bilaterally, normal work of breathing, no wheezes or crackles Abdominal: +BS, soft, nontender, nondistended, negative Murphy sign Musculoskeletal:  No pedal  edema, clubbing, cyanosis Skin: No rashes or lesions Neurological: Cranial nerves II through XII grossly intact, no focal deficits, sensation intact to light touch throughout. Psychiatric: Alert and oriented x 3, appropriate affect LABORATORY PANEL:  Female CBC Recent Labs  Lab 07/05/18 1655  WBC 15.0*  HGB 12.1  HCT 36.1  PLT 277   ------------------------------------------------------------------------------------------------------------------ Chemistries  Recent Labs  Lab 07/05/18 1655 07/06/18 0416  NA 136 137  K 4.1 3.9  CL 105 105  CO2 24 26  GLUCOSE 133* 124*  BUN 18 16  CREATININE 0.92 0.97  CALCIUM 9.3 8.8*  MG 1.9  --   AST 42* 615*  ALT 20 367*  ALKPHOS 37* 58  BILITOT 0.9 1.3*   RADIOLOGY:  Dg Chest 2 View  Result Date: 07/05/2018 CLINICAL DATA:  Acute chest pain. EXAM: CHEST - 2 VIEW COMPARISON:  10/31/2017 and prior radiographs FINDINGS: Cardiomegaly again noted. LOWER lobe opacity on the LATERAL view may represent atelectasis or airspace disease/pneumonia. There is no evidence of pulmonary edema, suspicious pulmonary nodule/mass, pleural effusion, or pneumothorax. No acute bony abnormalities are identified. A severe thoracolumbar scoliosis is again noted. IMPRESSION: LOWER lobe opacity on the LATERAL view which may represent atelectasis or pneumonia. Cardiomegaly Electronically Signed   By: Margarette Canada M.D.   On: 07/05/2018 17:36   Ct Angio Chest/abd/pel For Dissection W And/or Wo Contrast  Result Date: 07/05/2018 CLINICAL DATA:  Chest pain radiates to the back. EXAM: CT ANGIOGRAPHY CHEST, ABDOMEN AND PELVIS TECHNIQUE: Multidetector CT imaging through the chest, abdomen and pelvis was performed using the standard protocol during bolus administration of intravenous contrast. Multiplanar reconstructed images and MIPs were obtained and reviewed to evaluate the vascular anatomy. CONTRAST:  12mL  OMNIPAQUE IOHEXOL 350 MG/ML SOLN COMPARISON:  Abdomen pelvis CT 09/16/2017  FINDINGS: CTA CHEST FINDINGS Cardiovascular: Precontrast imaging shows no hyperdense crescent in the wall of the thoracic aorta to suggest acute intramural hematoma. Imaging after IV contrast administration shows no dissection of the thoracic aorta. No thoracic aortic aneurysm. There is abdominal aortic atherosclerosis without aneurysm. The heart size is normal. No substantial pericardial effusion. No large central pulmonary embolus in either main pulmonary artery or lobar pulmonary arteries. Mediastinum/Nodes: No mediastinal lymphadenopathy. There is no hilar lymphadenopathy. The esophagus has normal imaging features. There is no axillary lymphadenopathy. Lungs/Pleura: The central tracheobronchial airways are patent. Basilar atelectasis noted bilaterally. No suspicious nodule or mass. No edema or pleural effusion. Musculoskeletal: No worrisome lytic or sclerotic osseous abnormality. Review of the MIP images confirms the above findings. CTA ABDOMEN AND PELVIS FINDINGS VASCULAR Aorta: Atherosclerotic without aneurysm or dissection. Celiac: calcific plaque at the origin but widely patent. SMA: Proximal calcific plaque without features to suggest flow limiting stenosis Renals: Calcific plaque at the ostium bilaterally with patent renal arteries bilaterally. IMA: Opacifies normally. Inflow: Patent bilaterally. Veins: Not well evaluated due to bolus timing. Review of the MIP images confirms the above findings. NON-VASCULAR Hepatobiliary: Probable perfusion anomaly at the gallbladder fossa. No focal abnormality in the liver parenchyma. Gallbladder is distended with layering stones in the fundus and mural calcification compatible with porcelain gallbladder. Mild intra and extrahepatic biliary duct dilatation evident with common bile duct measuring 10 mm diameter in the head of pancreas. Pancreas: Unremarkable. Spleen: No splenomegaly. No focal mass lesion. Adrenals/Urinary Tract: No adrenal nodule or mass. Cortical  thinning noted both kidneys without hydronephrosis or suspicious mass lesion. No evidence for hydroureter. The urinary bladder appears normal for the degree of distention. Stomach/Bowel: Small hiatal hernia. Prominent fold thickness likely related to underdistention. Duodenum is normally positioned as is the ligament of Treitz. Duodenal diverticuli evident. No small bowel wall thickening. No small bowel dilatation. The terminal ileum is normal. The appendix is normal. No gross colonic mass. No colonic wall thickening. Diverticular changes are noted in the left colon without evidence of diverticulitis. Lymphatic: There is no gastrohepatic or hepatoduodenal ligament lymphadenopathy. No intraperitoneal or retroperitoneal lymphadenopathy. No pelvic sidewall lymphadenopathy. Reproductive: The uterus is unremarkable.  There is no adnexal mass. Other: No intraperitoneal free fluid. Musculoskeletal: old right superior and inferior pubic rami fractures noted. Bones diffusely demineralized. Thoracolumbar scoliosis evident. Review of the MIP images confirms the above findings. IMPRESSION: 1. No evidence for thoracoabdominal aortic dissection. No thoracoabdominal aortic aneurysm. No findings to suggest acute intramural hematoma in the thoracic aorta. There is no large central pulmonary embolus. 2. Small hiatal hernia. 3. Cholelithiasis with porcelain gallbladder. 4. Mild intra and extrahepatic biliary duct dilatation. Correlation with liver function test may prove helpful. Electronically Signed   By: Misty Stanley M.D.   On: 07/05/2018 18:24   US Abdomen Limited Ruq  Result Date: 07/05/2018 CLINICAL DATA:  Right upper quadrant pain. EXAM: ULTRASOUND ABDOMEN LIMITED RIGHT UPPER QUADRANT COMPARISON:  CT abdomen 09/16/2017 FINDINGS: Gallbladder: Gallbladder sludge in the gallbladder fundus with tiny cholelithiasis. Hyperechoic gallbladder wall as can be seen with porcelain gallbladder. Gallbladder wall thickening measuring 4.1  mm. Negative sonographic Murphy sign. Common bile duct: Diameter: 7.3 mm Liver: No focal lesion identified. Within normal limits in parenchymal echogenicity. Mild intrahepatic biliary ductal dilatation. Portal vein is patent on color Doppler imaging with normal direction of blood flow towards the liver. IMPRESSION: Gallbladder sludge with tiny cholelithiasis and mild gallbladder  wall thickening. Negative for sonographic Murphy's sign. Findings are equivocal for cholecystitis given lack of pain. If there is further clinical concern, recommend a HIDA scan. Hyperechoic gallbladder wall as can be seen with porcelain gallbladder. Electronically Signed   By: Kathreen Devoid   On: 07/05/2018 19:29   ASSESSMENT AND PLAN:   Cholelithiasis/porcelain gallbladder/biliary duct dilatation- seen on CT abdomen and abdominal ultrasound.  LFTs elevated.  No signs of sepsis. -Will obtain surgery and GI consults -Continue Zosyn for now -Stop Eliquis and place on heparin drip -Pain control  Paroxysmal atrial fibrillation- currently in normal sinus rhythm -Hold Eliquis and place on heparin drip in anticipation of possible surgery -Continue Coreg and flecainide  Hyperlipidemia- stable -Continue Crestor  GERD-stable -Continue home PPI   All the records are reviewed and case discussed with Care Management/Social Worker. Management plans discussed with the patient, family and they are in agreement.  CODE STATUS: Full Code  TOTAL TIME TAKING CARE OF THIS PATIENT: 45 minutes.   More than 50% of the time was spent in counseling/coordination of care: YES  POSSIBLE D/C IN 3-4 DAYS, DEPENDING ON CLINICAL CONDITION.   Berna Spare Mayo M.D on 07/06/2018 at 12:56 PM  Between 7am to 6pm - Pager [phone]  After 6pm go to [email or web address] - Technical brewer [facility]  Office  [phone]  CC: Primary care physician; [name], MD  Note: This dictation was prepared with Dragon  dictation along with smaller phrase technology. Any transcriptional errors that result from this process are unintentional.

## 2018-07-07 LAB — COMPREHENSIVE METABOLIC PANEL
ALT: 196 U/L — ABNORMAL HIGH (ref 0–44)
AST: 177 U/L — ABNORMAL HIGH (ref 15–41)
Albumin: 3.1 g/dL — ABNORMAL LOW (ref 3.5–5.0)
Alkaline Phosphatase: 54 U/L (ref 38–126)
Anion gap: 5 (ref 5–15)
BILIRUBIN TOTAL: 0.8 mg/dL (ref 0.3–1.2)
BUN: 15 mg/dL (ref 8–23)
CO2: 24 mmol/L (ref 22–32)
Calcium: 8.5 mg/dL — ABNORMAL LOW (ref 8.9–10.3)
Chloride: 107 mmol/L (ref 98–111)
Creatinine, Ser: 1.14 mg/dL — ABNORMAL HIGH (ref 0.44–1.00)
GFR calc Af Amer: 48 mL/min — ABNORMAL LOW (ref >60)
GFR calc non Af Amer: 41 mL/min — ABNORMAL LOW (ref >60)
Glucose, Bld: 91 mg/dL (ref 70–99)
POTASSIUM: 4 mmol/L (ref 3.5–5.1)
Sodium: 136 mmol/L (ref 135–145)
TOTAL PROTEIN: 6.1 g/dL — AB (ref 6.5–8.1)

## 2018-07-07 LAB — CBC
HCT: 34 % — ABNORMAL LOW (ref 36.0–46.0)
Hemoglobin: 11.1 g/dL — ABNORMAL LOW (ref 12.0–15.0)
MCH: 28.9 pg (ref 26.0–34.0)
MCHC: 32.6 g/dL (ref 30.0–36.0)
MCV: 88.5 fL (ref 80.0–100.0)
NRBC: 0 % (ref 0.0–0.2)
Platelets: 192 10*3/uL (ref 150–400)
RBC: 3.84 MIL/uL — ABNORMAL LOW (ref 3.87–5.11)
RDW: 13.1 % (ref 11.5–15.5)
WBC: 5.4 10*3/uL (ref 4.0–10.5)

## 2018-07-07 LAB — APTT: aPTT: 113 seconds — ABNORMAL HIGH (ref 24–36)

## 2018-07-07 LAB — HEPARIN LEVEL (UNFRACTIONATED): HEPARIN UNFRACTIONATED: 1.37 [IU]/mL — AB (ref 0.30–0.70)

## 2018-07-07 NOTE — Evaluation (Signed)
Physical Therapy Evaluation Patient Details Name: Kylie May MRN: [id number] DOB: 1922/03/22 Today's Date: 07/07/2018   History of Present Illness  Patient is a 85 year old female admitted with abdominal pain. Patient diagnosed with Cholelithiasis. PMH to include: HOH, Afib, Gerd, CHF and osteoporosis  Clinical Impression  Patient received in bed, pleasant, agreeable to walk stating that she has to go to the bathroom. Patient impulsive with mobility and once arrived in bathroom had large BM and needed to be cleaned up. Patient able to assist with self care in bathroom without difficulty/LOB.  Patient cleaned up and returned to bed. Patient does not require PT follow up at home once discharged today. Discussed possibility of HH PT with grandson, however he reports patient just finished bout of home PT and he believes she will be resistant to it. If needed once she returns home, grandson advised to let PCP know and have it ordered.       Follow Up Recommendations No PT follow up    Equipment Recommendations  None recommended by PT    Recommendations for Other Services       Precautions / Restrictions Precautions Precautions: Fall Restrictions Weight Bearing Restrictions: No      Mobility  Bed Mobility Overal bed mobility: Independent                Transfers Overall transfer level: Independent Equipment used: None                Ambulation/Gait Ambulation/Gait assistance: Counsellor (Feet): 20 Feet Assistive device: None       General Gait Details: patient ambulating quickly but possibly due to the fact that she was trying to get to the bathroom and had large loose stool.   Stairs            Wheelchair Mobility    Modified Rankin (Stroke Patients Only)       Balance Overall balance assessment: Mild deficits observed, not formally tested                                           Pertinent Vitals/Pain Pain  Assessment: No/denies pain    Home Living Family/patient expects to be discharged to:: Private residence Living Arrangements: Alone Available Help at Discharge: Family;Available PRN/intermittently Type of Home: House       Home Layout: Two level Home Equipment: Walker - standard Additional Comments: patient reports she has walker but does not use    Prior Function Level of Independence: Independent               Hand Dominance        Extremity/Trunk Assessment   Upper Extremity Assessment Upper Extremity Assessment: Overall WFL for tasks assessed    Lower Extremity Assessment Lower Extremity Assessment: Overall WFL for tasks assessed       Communication   Communication: HOH  Cognition Arousal/Alertness: Awake/alert Behavior During Therapy: WFL for tasks assessed/performed Overall Cognitive Status: Within Functional Limits for tasks assessed                                        General Comments      Exercises     Assessment/Plan    PT Assessment Patent does not need any further PT services  PT Problem List         PT Treatment Interventions Functional mobility training;Gait training    PT Goals (Current goals can be found in the Care Plan section)  Acute Rehab PT Goals Patient Stated Goal: to go home PT Goal Formulation: With patient/family Time For Goal Achievement: 07/10/18 Potential to Achieve Goals: Good    Frequency     Barriers to discharge        Co-evaluation               AM-PAC PT "6 Clicks" Mobility  Outcome Measure Help needed turning from your back to your side while in a flat bed without using bedrails?: None Help needed moving from lying on your back to sitting on the side of a flat bed without using bedrails?: None Help needed moving to and from a bed to a chair (including a wheelchair)?: None Help needed standing up from a chair using your arms (e.g., wheelchair or bedside chair)?: None Help  needed to walk in hospital room?: None Help needed climbing 3-5 steps with a railing? : None 6 Click Score: 24    End of Session Equipment Utilized During Treatment: Gait belt Activity Tolerance: Patient tolerated treatment well Patient left: in bed;with call bell/phone within reach;with bed alarm set Nurse Communication: Mobility status      Time: 5784-6962 PT Time Calculation (min) (ACUTE ONLY): 34 min   Charges:   PT Evaluation $PT Eval Low Complexity: 1 Low PT Treatments $Therapeutic Activity: 8-22 mins        Katonya Blecher, PT, GCS 07/07/18,10:30 AM

## 2018-07-07 NOTE — Discharge Instructions (Signed)
It was so nice to meet you during this hospitalization!  You came into the hospital with abdominal pain. We found that you have stones and sludge in your gallbladder. 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 We think you probably had a stone that was stuck in your common bile duct, but it passed on its own. Your MRCP did not show any stones and it did not show any infection around your gallbladder.  Please make sure you follow-up with your primary care doctor in 5 days. You can also follow-up with the surgeon in 2 weeks for a check-up.  Take care, Dr. Brett Albino

## 2018-07-07 NOTE — Progress Notes (Signed)
[place] for Heparin drip Indication: atrial fibrillation  Allergies  Allergen Reactions  . Metronidazole     Other reaction(s): Dizziness and giddiness (finding), Other (qualifier value) made things feel like they were vibrating like the bed and floor,also caused mild dizziness weakness  . Penicillin G Rash    Patient Measurements: Height: 5\' 4"  (162.6 cm) Weight: 104 lb (47.2 kg) IBW/kg (Calculated) : 54.7 Heparin Dosing Weight: 47. 2 kg  Vital Signs: Temp: 98.3 F (36.8 C) (02/04 0731) Temp Source: Oral (02/04 0731) BP: 127/59 (02/04 0731) Pulse Rate: 64 (02/04 0731)  Labs: Recent Labs    07/05/18 1655 07/06/18 0416 07/07/18 0533  HGB 12.1  --  11.1*  HCT 36.1  --  34.0*  PLT 277  --  192  APTT  --   --  113*  LABPROT 14.0  --   --   INR 1.09  --   --   HEPARINUNFRC  --   --  1.37*  CREATININE 0.92 0.97 1.14*  TROPONINI <0.03  --   --     Estimated Creatinine Clearance: 22.5 mL/min (A) (by C-G formula based on SCr of 1.14 mg/dL (H)).   Medical History: Past Medical History:  Diagnosis Date  . A-fib ([place])   . Acid reflux disease   . Arrhythmia   . Chronic diastolic CHF (congestive heart failure) ([place])   . Compression fracture of lumbar vertebra ([place])   . Diverticulitis 01-2008   GI [facility]  . Diverticulosis   . Esophageal stricture 2006  . Femoral bruit    Right  . Fibrocystic breast disease   . Hiatal hernia   . Hypercholesterolemia    [place] study LDL goal = < 160  . Mitral regurgitation    a. 06/2014 EF 55-60%, elevated end-diastolic pressures, dilated LA at 4.3 cm, mildly dilated RA, severe mitral regurgitation, mild aortic sclerosis without stenosis, mod-severe TR  . Mitral valve prolapse   . Osteoporosis    Dr Matthew Saras  . Pancreatitis 07-2008   Hospitalized   . Schatzki's ring   . Skin cancer    facial x 2. [name]    Assessment: Patient is a 85yo female admitted with abdominal pain due to  gallbladder disease. Patient on Apixaban prior to admission for afib, last dose given on 2/3 at 09:30. Patient to transition to Heparin drip in preparation for possible surgery.  2/3  Will begin Heparin drip 700 units/hr at 21:00 tonight without a bolus. Will check HL and aPTT at 05:00. Will likely need to use aPTT to guide therapy until HL and aPTT correlate. Daily CBC while on Heparin drip.  Goal of Therapy:  Heparin level 0.3-0.7 units/ml aPTT 66-107 seconds Monitor platelets by anticoagulation protocol: Yes   Plan: 2/4 0533 aPTT 113, HL 1.37. Will continue to follow aPTT. Will decrease Heparin drip to 600 units/hr for goal of 66-107. Will check aPTT in 6 hours. Will evaluate HL once daily until aPTT and HL correlate  Chinita Greenland PharmD Clinical Pharmacist 07/07/2018

## 2018-07-07 NOTE — Discharge Summary (Signed)
[facility] NAME: Kylie May    MR#:  [id number]  DATE OF BIRTH:  January 24, 1922  DATE OF ADMISSION:  07/05/2018   ADMITTING PHYSICIAN: Arta Silence, MD  DATE OF DISCHARGE: 07/07/18  PRIMARY CARE PHYSICIAN: [name], MD   ADMISSION DIAGNOSIS:  Biliary colic [W09.81] Abdominal pain [R10.9] DISCHARGE DIAGNOSIS:  Active Problems:   Right upper quadrant abdominal pain  SECONDARY DIAGNOSIS:   Past Medical History:  Diagnosis Date  . A-fib ([place])   . Acid reflux disease   . Arrhythmia   . Chronic diastolic CHF (congestive heart failure) ([place])   . Compression fracture of lumbar vertebra ([place])   . Diverticulitis 01-2008   GI [place]  . Diverticulosis   . Esophageal stricture 2006  . Femoral bruit    Right  . Fibrocystic breast disease   . Hiatal hernia   . Hypercholesterolemia    [place] study LDL goal = < 160  . Mitral regurgitation    a. 06/2014 EF 55-60%, elevated end-diastolic pressures, dilated LA at 4.3 cm, mildly dilated RA, severe mitral regurgitation, mild aortic sclerosis without stenosis, mod-severe TR  . Mitral valve prolapse   . Osteoporosis    Dr Matthew Saras  . Pancreatitis 07-2008   Hospitalized   . Schatzki's ring   . Skin cancer    facial x 2. [name]   HOSPITAL COURSE:   Kylie May is a 85 year old female who presented to the ED with right upper quadrant and epigastric abdominal pain.  In the ED, CTA chest/abdomen/pelvis showed cholelithiasis with porcelain gallbladder and mild intra-and extrahepatic biliary ductal dilatation.  Right upper quadrant ultrasound showed gallbladder sludge with tiny cholelithiasis and mild gallbladder wall thickening and hyperechoic gallbladder wall as can be seen with porcelain gallbladder.  She was admitted for further management.  Cholelithiasis/porcelain gallbladder -AST and ALT were markedly elevated on the day after admission, but trended down on the  day of discharge. -MRCP was performed and showed cholelithiasis without cholecystitis with no evidence of choledocholithiasis. -Surgery consulted and recommended conservative management due to patient's age and comorbidities.  Patient will follow up with them as an outpatient. -GI was consulted and did not recommend ERCP. -Initially treated with Zosyn, but this was stopped on discharge due to lack of evidence for cholecystitis -Patient will need LFTs rechecked as an outpatient.  If they remain elevated, will need additional work-up for alternative etiology.  Paroxysmal atrial fibrillation- in NSR this hospitalization -Eliquis initially held and patient placed on heparin drip in anticipation of possible surgery.  Eliquis restarted on discharge. -Continued Coreg and flecainide  Hyperlipidemia- stable -Continued Crestor  GERD-stable -Continued home PPI  DISCHARGE CONDITIONS:  Cholelithiasis Porcelain gallbladder Paroxysmal atrial fibrillation Hyperlipidemia GERD CONSULTS OBTAINED:  Treatment Team:  Arta Silence, MD Olean Ree, MD [name], MD DRUG ALLERGIES:   Allergies  Allergen Reactions  . Metronidazole     Other reaction(s): Dizziness and giddiness (finding), Other (qualifier value) made things feel like they were vibrating like the bed and floor,also caused mild dizziness weakness  . Penicillin G Rash   DISCHARGE MEDICATIONS:   Allergies as of 07/07/2018      Reactions   Metronidazole    Other reaction(s): Dizziness and giddiness (finding), Other (qualifier value) made things feel like they were vibrating like the bed and floor,also caused mild dizziness weakness   Penicillin G Rash      Medication List    TAKE these medications  apixaban 2.5 MG Tabs tablet Commonly known as:  ELIQUIS Take 1 tablet (2.5 mg total) by mouth 2 (two) times daily.   carvedilol 3.125 MG tablet Commonly known as:  COREG Take 1 tablet (3.125 mg total) by  mouth 2 (two) times daily with a meal.   flecainide 50 MG tablet Commonly known as:  TAMBOCOR Take 1 tablet (50 mg total) by mouth 2 (two) times daily.   magnesium chloride 64 MG Tbec SR tablet Commonly known as:  SLOW-MAG Take 2 tablets (128 mg total) by mouth daily.   omeprazole 40 MG capsule Commonly known as:  PRILOSEC Take 1 capsule (40 mg total) by mouth daily.   rosuvastatin 20 MG tablet Commonly known as:  CRESTOR Take 1 tablet (20 mg total) by mouth daily. What changed:  when to take this        DISCHARGE INSTRUCTIONS:  1.  Follow-up with PCP in 5 days 2.  Follow-up with surgery in 2 weeks 3.  Needs LFTs rechecked as an outpatient.  If LFTs remain elevated, will need work-up for alternative etiology 4.  Continue to encourage low-fat diet as an outpatient DIET:  Cardiac diet DISCHARGE CONDITION:  Stable ACTIVITY:  Activity as tolerated OXYGEN:  Home Oxygen: No.  Oxygen Delivery: room air DISCHARGE LOCATION:  home   If you experience worsening of your admission symptoms, develop shortness of breath, life threatening emergency, suicidal or homicidal thoughts you must seek medical attention immediately by calling 911 or calling your MD immediately  if symptoms less severe.  You Must read complete instructions/literature along with all the possible adverse reactions/side effects for all the Medicines you take and that have been prescribed to you. Take any new Medicines after you have completely understood and accpet all the possible adverse reactions/side effects.   Please note  You were cared for by a hospitalist during your hospital stay. If you have any questions about your discharge medications or the care you received while you were in the hospital after you are discharged, you can call the unit and asked to speak with the hospitalist on call if the hospitalist that took care of you is not available. Once you are discharged, your primary care physician will  handle any further medical issues. Please note that NO REFILLS for any discharge medications will be authorized once you are discharged, as it is imperative that you return to your primary care physician (or establish a relationship with a primary care physician if you do not have one) for your aftercare needs so that they can reassess your need for medications and monitor your lab values.    On the day of Discharge:  VITAL SIGNS:  Blood pressure (!) 127/59, pulse 64, temperature 98.3 F (36.8 C), temperature source Oral, resp. rate 20, height 5\' 4"  (1.626 m), weight 47.2 kg, SpO2 95 %. PHYSICAL EXAMINATION:  GENERAL:  85 y.o.-year-old patient lying in the bed with no acute distress.  EYES: Pupils equal, round, reactive to light and accommodation. No scleral icterus. Extraocular muscles intact.  HEENT: Head atraumatic, normocephalic. Oropharynx and nasopharynx clear. +hard of hearing. NECK:  Supple, no jugular venous distention. No thyroid enlargement, no tenderness.  LUNGS: Normal breath sounds bilaterally, no wheezing, rales, rhonchi or crepitation. No use of accessory muscles of respiration.  CARDIOVASCULAR: RRR, S1, S2 normal. No murmurs, rubs, or gallops.  ABDOMEN: Soft, non-tender, non-distended. Bowel sounds present. No organomegaly or mass.  EXTREMITIES: No pedal edema, cyanosis, or clubbing.  NEUROLOGIC: Cranial nerves II  through XII are intact. Muscle strength 5/5 in all extremities. Sensation intact. Gait not checked.  PSYCHIATRIC: The patient is alert and oriented x 3.  SKIN: No obvious rash, lesion, or ulcer.  DATA REVIEW:   CBC Recent Labs  Lab 07/07/18 0533  WBC 5.4  HGB 11.1*  HCT 34.0*  PLT 192    Chemistries  Recent Labs  Lab 07/05/18 1655  07/07/18 0533  NA 136   < > 136  K 4.1   < > 4.0  CL 105   < > 107  CO2 24   < > 24  GLUCOSE 133*   < > 91  BUN 18   < > 15  CREATININE 0.92   < > 1.14*  CALCIUM 9.3   < > 8.5*  MG 1.9  --   --   AST 42*   < > 177*    ALT 20   < > 196*  ALKPHOS 37*   < > 54  BILITOT 0.9   < > 0.8   < > = values in this interval not displayed.     Microbiology Results  Results for orders placed or performed in visit on 10/03/16  Urine Culture     Status: None   Collection Time: 10/03/16  3:13 PM  Result Value Ref Range Status   Organism ID, Bacteria   Final    Three or more organisms present,each greater than 10,000 CFU/mL.These organisms,commonly found on external and internal genitalia,are considered to be colonizers.No further testing performed.     RADIOLOGY:  Mr Abdomen With Mrcp W Contrast  Result Date: 07/06/2018 CLINICAL DATA:  Acute onset of right upper quadrant abdominal pain yesterday. Nausea and vomiting. EXAM: MRI ABDOMEN WITH CONTRAST (WITH MRCP) TECHNIQUE: Multiplanar multisequence MR imaging of the abdomen was performed following the administration of intravenous contrast. Heavily T2-weighted images of the biliary and pancreatic ducts were obtained, and three-dimensional MRCP images were rendered by post processing. CONTRAST:  4 cc Gadavist COMPARISON:  Abdominal ultrasound 07/05/2018. Abdominal CT 09/16/2017. FINDINGS: Lower chest:  The visualized lower chest appears unremarkable. Hepatobiliary: The liver is normal in signal without steatosis, focal lesion or abnormal enhancement. Multiple small gallstones are present within the gallbladder lumen. There is no significant gallbladder wall thickening. The common hepatic duct measures 8 mm in diameter. No evidence of choledocholithiasis. There is mild intrahepatic biliary dilatation as well. Pancreas: Unremarkable. No pancreatic ductal dilatation or surrounding inflammatory change. No pancreas divisum. Spleen: Normal in size without focal abnormality. Adrenals/Urinary Tract: Both adrenal glands appear normal. Both kidneys appear normal. No evidence of hydronephrosis or renal mass. Stomach/Bowel: No evidence of bowel wall thickening, distention or surrounding  inflammatory change.There are scattered diverticular changes within the colon. The stomach is mildly distended with wall thickening proximally, similar to previous CT. Vascular/Lymphatic: There are no enlarged abdominal lymph nodes. No significant vascular findings. Other: No ascites.  The anterior abdominal wall appears intact. Musculoskeletal: No acute or significant osseous findings. Severe convex left thoracolumbar scoliosis with associated spondylosis. Chronic L1 compression deformity appears unchanged. IMPRESSION: 1. Cholelithiasis without evidence of cholecystitis. 2. Mild intrahepatic and extrahepatic biliary dilatation, similar to previous CT. No evidence of choledocholithiasis. 3. Chronic gastric wall thickening. 4. Severe convex left scoliosis. Electronically Signed   By: Richardean Sale M.D.   On: 07/06/2018 15:28     Management plans discussed with the patient, family and they are in agreement.  CODE STATUS: Full Code   TOTAL TIME TAKING CARE OF THIS  PATIENT: 45 minutes.    Berna Spare Mayo M.D on 07/07/2018 at 1:51 PM  Between 7am to 6pm - Pager - [phone]  After 6pm go to [email or web address] - Technical brewer [facility]  Office  [phone]  CC: Primary care physician; [name], MD   Note: This dictation was prepared with Dragon dictation along with smaller phrase technology. Any transcriptional errors that result from this process are unintentional.

## 2018-07-07 NOTE — Care Management Note (Signed)
Case Management Note  Patient Details  Name: ETHERINE MACKOWIAK MRN: [id number] Date of Birth: 21-Jul-1921  Subjective/Objective:                    Action/Plan: Met with Patient to discuss DC plan Patient states that she lives alone and is completely independent, she has a RW at home but does not need it.  She has family checking on her regularly, gave contact information for any needs  Expected Discharge Date:  07/07/18               Expected Discharge Plan:     In-House Referral:     Discharge planning Services  CM Consult  Post Acute Care Choice:    Choice offered to:     DME Arranged:    DME Agency:     HH Arranged:    [place] Agency:     Status of Service:  Completed, signed off  If discussed at [employer], dates discussed:    Additional Comments:  Su Hilt, RN 07/07/2018, 10:23 AM

## 2018-07-07 NOTE — Progress Notes (Signed)
[facility] SURGICAL PROGRESS NOTE (cpt [phone])  Hospital Day(s): 2.   Post op day(s):  [name]   Interval History: Patient seen and examined, no acute events or new complaints overnight. Patient reports continued resolution in her abdominal pain, nausea, or emesis. She continues to tolerate a diet without adverse event. No other complaints this morning.   Review of Systems:  Constitutional: denies fever, chills  Gastrointestinal: denies abdominal pain, N/V, or diarrhea/and bowel function as per interval history Genitourinary: denies burning with urination or urinary frequency   Vital signs in last 24 hours: [min-max] current  Temp:  [97.7 F (36.5 C)-98.3 F (36.8 C)] 98.3 F (36.8 C) (02/04 0731) Pulse Rate:  [64-79] 64 (02/04 0731) Resp:  [18-20] 20 (02/04 0731) BP: (103-128)/(59-66) 127/59 (02/04 0731) SpO2:  [94 %-95 %] 95 % (02/04 0731)     Height: 5\' 4"  (162.6 cm) Weight: 47.2 kg BMI (Calculated): 17.84   Intake/Output this shift:  No intake/output data recorded.   Intake/Output last 2 shifts:  @IOLAST2SHIFTS @   Physical Exam:  Constitutional: alert, cooperative and no distress  HENT: normocephalic without obvious abnormality, no scleral icterus Respiratory: breathing non-labored at rest  Gastrointestinal: soft, non-tender, and non-distended    Labs:  CBC Latest Ref Rng & Units 07/07/2018 07/05/2018 07/16/2017  WBC 4.0 - 10.5 K/uL 5.4 15.0(H) 7.8  Hemoglobin 12.0 - 15.0 g/dL 11.1(L) 12.1 13.3  Hematocrit 36.0 - 46.0 % 34.0(L) 36.1 39.2  Platelets 150 - 400 K/uL 192 277 304.0   CMP Latest Ref Rng & Units 07/07/2018 07/06/2018 07/05/2018  Glucose 70 - 99 mg/dL 91 124(H) 133(H)  BUN 8 - 23 mg/dL 15 16 18   Creatinine 0.44 - 1.00 mg/dL 1.14(H) 0.97 0.92  Sodium 135 - 145 mmol/L 136 137 136  Potassium 3.5 - 5.1 mmol/L 4.0 3.9 4.1  Chloride 98 - 111 mmol/L 107 105 105  CO2 22 - 32 mmol/L 24 26 24   Calcium 8.9 - 10.3 mg/dL 8.5(L) 8.8(L) 9.3  Total Protein 6.5 -  8.1 g/dL 6.1(L) 6.0(L) 6.6  Total Bilirubin 0.3 - 1.2 mg/dL 0.8 1.3(H) 0.9  Alkaline Phos 38 - 126 U/L 54 58 37(L)  AST 15 - 41 U/L 177(H) 615(H) 42(H)  ALT 0 - 44 U/L 196(H) 367(H) 20     Imaging studies: No new pertinent imaging studies   Assessment/Plan: (ICD-10's: K31.86) 85 y.o. female with improved/resolved LFT and bilirubin elevation likely attributable to passed gallstone given lack of filling defect on MRCP yesterday, complicated by pertinent comorbidities including atrial fibrillation on Eliquis, GERD, chronic diastolic CHF, mitral regurgitation, HLD, osteoporosis, former tobacco abuse (smoking), and advanced age.   - Regular diet as tolerated, wean IVF  - Continue to monitor abdominal exam  - Discussed with Dr Hampton Abbot, given patients aga and comorbidities, will continue conservative management and defer surgical intervention, which patient and family are in agreement with.   - Appreciate GI input, no indication for ERCP   - Medical management per primary team   - Patient is cleared for discharge from surgical standpoint. She can follow up with Dr Hampton Abbot as an outpatient as needed.    All of the above findings and recommendations were discussed with the patient, and the medical team, and all of patient's and medical team's questions were answered to their expressed satisfaction.   -- Edison Simon, PA-C [facility] 07/07/2018, 9:56 AM [phone] M-F: 7am - 4pm

## 2018-07-09 ENCOUNTER — Telehealth: Payer: Self-pay | Admitting: Family Medicine

## 2018-07-09 NOTE — Telephone Encounter (Signed)
Transition Care Management Follow-up Telephone Call  How have you been since you were released from the hospital? Patient cannot hear well, ut was able to find out that she feels well.   Do you understand why you were in the hospital? yes   Do you understand the discharge instrcutions? yes  Items Reviewed:  Medications reviewed: yes  Allergies reviewed: yes  Dietary changes reviewed: yes  Referrals reviewed: yes   Functional Questionnaire:   Activities of Daily Living (ADLs):   She states they are independent in the following: ambulation, bathing and hygiene, feeding, continence, grooming, toileting and dressing States they require assistance with the following: No assistance requested.   Any transportation issues/concerns?: no   Any patient concerns? no   Confirmed importance and date/time of follow-up visits scheduled: yes   Confirmed with patient if condition begins to worsen call PCP or go to the ER.  Patient was given the Call-a-Nurse line [phone]: yes

## 2018-07-13 ENCOUNTER — Ambulatory Visit (INDEPENDENT_AMBULATORY_CARE_PROVIDER_SITE_OTHER): Payer: Medicare Other | Admitting: Family Medicine

## 2018-07-13 ENCOUNTER — Encounter: Payer: Self-pay | Admitting: Family Medicine

## 2018-07-13 VITALS — BP 118/70 | HR 70 | Temp 97.5°F | Ht 64.0 in | Wt 102.4 lb

## 2018-07-13 DIAGNOSIS — D649 Anemia, unspecified: Secondary | ICD-10-CM | POA: Insufficient documentation

## 2018-07-13 DIAGNOSIS — R1011 Right upper quadrant pain: Secondary | ICD-10-CM | POA: Diagnosis not present

## 2018-07-13 DIAGNOSIS — M419 Scoliosis, unspecified: Secondary | ICD-10-CM | POA: Diagnosis not present

## 2018-07-13 LAB — CBC
HCT: 36.8 % (ref 36.0–46.0)
Hemoglobin: 12.2 g/dL (ref 12.0–15.0)
MCHC: 33.2 g/dL (ref 30.0–36.0)
MCV: 88.5 fl (ref 78.0–100.0)
Platelets: 255 10*3/uL (ref 150.0–400.0)
RBC: 4.16 Mil/uL (ref 3.87–5.11)
RDW: 12.9 % (ref 11.5–15.5)
WBC: 4.8 10*3/uL (ref 4.0–10.5)

## 2018-07-13 LAB — COMPREHENSIVE METABOLIC PANEL
ALT: 34 U/L (ref 0–35)
AST: 22 U/L (ref 0–37)
Albumin: 4.1 g/dL (ref 3.5–5.2)
Alkaline Phosphatase: 55 U/L (ref 39–117)
BUN: 15 mg/dL (ref 6–23)
CO2: 26 mEq/L (ref 19–32)
Calcium: 9.5 mg/dL (ref 8.4–10.5)
Chloride: 103 mEq/L (ref 96–112)
Creatinine, Ser: 0.9 mg/dL (ref 0.40–1.20)
GFR: 58.22 mL/min — ABNORMAL LOW (ref >60.00)
Glucose, Bld: 84 mg/dL (ref 70–99)
Potassium: 4.8 mEq/L (ref 3.5–5.1)
SODIUM: 136 meq/L (ref 135–145)
Total Bilirubin: 0.5 mg/dL (ref 0.2–1.2)
Total Protein: 6.8 g/dL (ref 6.0–8.3)

## 2018-07-13 NOTE — Patient Instructions (Signed)
Nice to see you. We will check lab work today. If you have recurrence of the pain please be evaluated.

## 2018-07-13 NOTE — Progress Notes (Signed)
Tommi Rumps, MD Phone: [phone]  Kylie May is a 85 y.o. female who presents today for hospital follow-up.  CC: Right upper quadrant pain, gallstone  Patient was hospitalized from 07/05/2018- 07/07/2018 for severe epigastric and right upper quadrant pain.  She notes this came on fairly suddenly while at home.  It radiated through to her back and then went away.  She noted recurrence on 2 more occasions and subsequently went to the ED.  She was diagnosed with cholelithiasis and porcelain gallbladder.  Her liver function tests trended up and then trended down.  She underwent CTA chest abdomen and pelvis which revealed cholelithiasis with porcelain gallbladder and mild intra-and extrahepatic biliary ductal dilatation.  She had right upper quadrant ultrasound which revealed gallbladder sludge with tiny cholelithiasis and mild gallbladder wall thickening and hyperechoic gallbladder wall as can be seen with porcelain gallbladder.  She underwent MRCP which showed cholelithiasis without cholecystitis and with no evidence of choledocholithiasis.  General surgery recommended conservative management due to age and comorbidities.  GI was consulted and did not recommend ERCP.  It was felt that the patient's symptoms and LFT abnormalities were related to passing a gallstone.  She does note some tiredness since discharge.  Her imaging did reveal scoliosis though she notes her back typically only bothers her if she lays flat.  She has not had any recurrent pain.  No nausea, vomiting, or diarrhea.  She was noted to be slightly anemic.  Discharge summary reviewed.  Medications reviewed.  Social History   Tobacco Use  Smoking Status Former Smoker  . Last attempt to quit: 06/04/1951  . Years since quitting: 67.1  Smokeless Tobacco Never Used  Tobacco Comment   Smoked 1945-1953 , up to 2 cigarettes/day     ROS see history of present illness  Objective  Physical Exam Vitals:   07/13/18 1150  BP:  118/70  Pulse: 70  Temp: (!) 97.5 F (36.4 C)  SpO2: 90%    BP Readings from Last 3 Encounters:  07/13/18 118/70  07/07/18 (!) 127/59  06/11/18 120/64   Wt Readings from Last 3 Encounters:  07/13/18 102 lb 6.4 oz (46.4 kg)  07/05/18 104 lb (47.2 kg)  06/11/18 104 lb (47.2 kg)    Physical Exam Constitutional:      General: She is not in acute distress.    Appearance: She is not diaphoretic.  HENT:     Ears:     Comments: Patient is hard of hearing Cardiovascular:     Rate and Rhythm: Normal rate and regular rhythm.     Heart sounds: Normal heart sounds.  Pulmonary:     Effort: Pulmonary effort is normal.     Breath sounds: Normal breath sounds.  Abdominal:     General: Bowel sounds are normal. There is no distension.     Palpations: Abdomen is soft.     Tenderness: There is no abdominal tenderness. There is no guarding or rebound.  Skin:    General: Skin is warm and dry.  Neurological:     Mental Status: She is alert.      Assessment/Plan: Please see individual problem list.  Right upper quadrant abdominal pain Patient evaluated in the hospital for this.  Likely related to a passed gallstone.  She is asymptomatic currently.  She has had no recurrence of her symptoms.  We will recheck a CMP.  Depending on results she may need to follow-up with GI.  General surgery recommended as needed follow-up.  Patient  was given return precautions.  Anemia Noted on lab work in the hospital.  Patient reports having quite a bit of bleeding when they removed 1 of her IVs.  She has had no recurrence of this.  Will recheck CBC.  Scoliosis Noted on imaging.  She does have some discomfort when laying flat though otherwise does well.  She will monitor for any worsening.    Orders Placed This Encounter  Procedures  . CBC  . Comp Met (CMET)    No orders of the defined types were placed in this encounter.    Tommi Rumps, MD [place]

## 2018-07-13 NOTE — Assessment & Plan Note (Signed)
Noted on imaging.  She does have some discomfort when laying flat though otherwise does well.  She will monitor for any worsening.

## 2018-07-13 NOTE — Assessment & Plan Note (Signed)
Noted on lab work in the hospital.  Patient reports having quite a bit of bleeding when they removed 1 of her IVs.  She has had no recurrence of this.  Will recheck CBC.

## 2018-07-13 NOTE — Assessment & Plan Note (Signed)
Patient evaluated in the hospital for this.  Likely related to a passed gallstone.  She is asymptomatic currently.  She has had no recurrence of her symptoms.  We will recheck a CMP.  Depending on results she may need to follow-up with GI.  General surgery recommended as needed follow-up.  Patient was given return precautions.

## 2018-07-22 ENCOUNTER — Inpatient Hospital Stay: Payer: Medicare Other | Admitting: Surgery

## 2018-07-29 DIAGNOSIS — L309 Dermatitis, unspecified: Secondary | ICD-10-CM | POA: Diagnosis not present

## 2018-07-29 DIAGNOSIS — L308 Other specified dermatitis: Secondary | ICD-10-CM | POA: Diagnosis not present

## 2018-08-04 ENCOUNTER — Other Ambulatory Visit: Payer: Self-pay

## 2018-08-04 MED ORDER — CARVEDILOL 3.125 MG PO TABS: 3.1250 mg | ORAL_TABLET | Freq: Two times a day (BID) | ORAL | 1 refills | Status: DC

## 2018-08-04 MED ORDER — FLECAINIDE ACETATE 50 MG PO TABS: 50.0000 mg | ORAL_TABLET | Freq: Two times a day (BID) | ORAL | 1 refills | Status: DC

## 2018-08-04 MED ORDER — ROSUVASTATIN CALCIUM 20 MG PO TABS: 20.0000 mg | ORAL_TABLET | Freq: Every day | ORAL | 1 refills | Status: DC

## 2018-08-04 MED ORDER — APIXABAN 2.5 MG PO TABS: 2.5000 mg | ORAL_TABLET | Freq: Two times a day (BID) | ORAL | 0 refills | Status: DC

## 2018-08-04 NOTE — Telephone Encounter (Signed)
Please review Eloquis for refill.

## 2018-08-04 NOTE — Telephone Encounter (Signed)
*  STAT* If patient is at the pharmacy, call can be transferred to refill team.   1. Which medications need to be refilled? (please list name of each medication and dose if known) ELIQUIS, COREG, FLECAINIDE, CRESTOR  2. Which pharmacy/location (including street and city if local pharmacy) is medication to be sent to? [place]  3. Do they need a 30 day or 90 day supply? [place]

## 2018-08-14 ENCOUNTER — Emergency Department: Payer: Medicare Other

## 2018-08-14 ENCOUNTER — Encounter: Payer: Self-pay | Admitting: Emergency Medicine

## 2018-08-14 ENCOUNTER — Inpatient Hospital Stay
Admission: EM | Admit: 2018-08-14 | Discharge: 2018-08-15 | DRG: 872 | Disposition: A | Discharge status: Other Acute Inpatient Hospital | Payer: Medicare Other | Attending: Internal Medicine | Admitting: Internal Medicine

## 2018-08-14 ENCOUNTER — Other Ambulatory Visit: Payer: Self-pay

## 2018-08-14 DIAGNOSIS — Z7901 Long term (current) use of anticoagulants: Secondary | ICD-10-CM

## 2018-08-14 DIAGNOSIS — R109 Unspecified abdominal pain: Secondary | ICD-10-CM | POA: Diagnosis not present

## 2018-08-14 DIAGNOSIS — Z9049 Acquired absence of other specified parts of digestive tract: Secondary | ICD-10-CM | POA: Diagnosis not present

## 2018-08-14 DIAGNOSIS — K819 Cholecystitis, unspecified: Secondary | ICD-10-CM | POA: Diagnosis not present

## 2018-08-14 DIAGNOSIS — I5032 Chronic diastolic (congestive) heart failure: Secondary | ICD-10-CM | POA: Diagnosis not present

## 2018-08-14 DIAGNOSIS — A419 Sepsis, unspecified organism: Principal | ICD-10-CM | POA: Diagnosis present

## 2018-08-14 DIAGNOSIS — I4819 Other persistent atrial fibrillation: Secondary | ICD-10-CM | POA: Diagnosis present

## 2018-08-14 DIAGNOSIS — E7800 Pure hypercholesterolemia, unspecified: Secondary | ICD-10-CM | POA: Diagnosis present

## 2018-08-14 DIAGNOSIS — E785 Hyperlipidemia, unspecified: Secondary | ICD-10-CM | POA: Diagnosis present

## 2018-08-14 DIAGNOSIS — H9190 Unspecified hearing loss, unspecified ear: Secondary | ICD-10-CM | POA: Diagnosis present

## 2018-08-14 DIAGNOSIS — R1011 Right upper quadrant pain: Secondary | ICD-10-CM | POA: Diagnosis present

## 2018-08-14 DIAGNOSIS — Z8249 Family history of ischemic heart disease and other diseases of the circulatory system: Secondary | ICD-10-CM

## 2018-08-14 DIAGNOSIS — J90 Pleural effusion, not elsewhere classified: Secondary | ICD-10-CM | POA: Diagnosis not present

## 2018-08-14 DIAGNOSIS — Z79899 Other long term (current) drug therapy: Secondary | ICD-10-CM

## 2018-08-14 DIAGNOSIS — L270 Generalized skin eruption due to drugs and medicaments taken internally: Secondary | ICD-10-CM | POA: Diagnosis not present

## 2018-08-14 DIAGNOSIS — Z85828 Personal history of other malignant neoplasm of skin: Secondary | ICD-10-CM

## 2018-08-14 DIAGNOSIS — R110 Nausea: Secondary | ICD-10-CM | POA: Diagnosis not present

## 2018-08-14 DIAGNOSIS — M810 Age-related osteoporosis without current pathological fracture: Secondary | ICD-10-CM | POA: Diagnosis present

## 2018-08-14 DIAGNOSIS — R7401 Elevation of levels of liver transaminase levels: Secondary | ICD-10-CM | POA: Diagnosis present

## 2018-08-14 DIAGNOSIS — K8064 Calculus of gallbladder and bile duct with chronic cholecystitis without obstruction: Secondary | ICD-10-CM | POA: Diagnosis not present

## 2018-08-14 DIAGNOSIS — R52 Pain, unspecified: Secondary | ICD-10-CM

## 2018-08-14 DIAGNOSIS — Z87891 Personal history of nicotine dependence: Secondary | ICD-10-CM

## 2018-08-14 DIAGNOSIS — R509 Fever, unspecified: Secondary | ICD-10-CM | POA: Diagnosis not present

## 2018-08-14 DIAGNOSIS — K219 Gastro-esophageal reflux disease without esophagitis: Secondary | ICD-10-CM | POA: Diagnosis present

## 2018-08-14 DIAGNOSIS — R079 Chest pain, unspecified: Secondary | ICD-10-CM | POA: Diagnosis not present

## 2018-08-14 DIAGNOSIS — R740 Nonspecific elevation of levels of transaminase and lactic acid dehydrogenase [LDH]: Secondary | ICD-10-CM

## 2018-08-14 DIAGNOSIS — R1013 Epigastric pain: Secondary | ICD-10-CM | POA: Diagnosis not present

## 2018-08-14 LAB — HEPATIC FUNCTION PANEL
ALT: 150 U/L — ABNORMAL HIGH (ref 0–44)
AST: 407 U/L — ABNORMAL HIGH (ref 15–41)
Albumin: 3.6 g/dL (ref 3.5–5.0)
Alkaline Phosphatase: 153 U/L — ABNORMAL HIGH (ref 38–126)
Bilirubin, Direct: 0.8 mg/dL — ABNORMAL HIGH (ref 0.0–0.2)
Indirect Bilirubin: 0.7 mg/dL (ref 0.3–0.9)
Total Bilirubin: 1.5 mg/dL — ABNORMAL HIGH (ref 0.3–1.2)
Total Protein: 6.7 g/dL (ref 6.5–8.1)

## 2018-08-14 LAB — CBC WITH DIFFERENTIAL/PLATELET
ABS IMMATURE GRANULOCYTES: 0.06 10*3/uL (ref 0.00–0.07)
BASOS PCT: 0 %
Basophils Absolute: 0.1 10*3/uL (ref 0.0–0.1)
Eosinophils Absolute: 0.1 10*3/uL (ref 0.0–0.5)
Eosinophils Relative: 1 %
HCT: 33.5 % — ABNORMAL LOW (ref 36.0–46.0)
Hemoglobin: 11 g/dL — ABNORMAL LOW (ref 12.0–15.0)
Immature Granulocytes: 0 %
Lymphocytes Relative: 9 %
Lymphs Abs: 1.6 10*3/uL (ref 0.7–4.0)
MCH: 29.2 pg (ref 26.0–34.0)
MCHC: 32.8 g/dL (ref 30.0–36.0)
MCV: 88.9 fL (ref 80.0–100.0)
Monocytes Absolute: 0.4 10*3/uL (ref 0.1–1.0)
Monocytes Relative: 2 %
Neutro Abs: 15.6 10*3/uL — ABNORMAL HIGH (ref 1.7–7.7)
Neutrophils Relative %: 88 %
Platelets: 292 10*3/uL (ref 150–400)
RBC: 3.77 MIL/uL — ABNORMAL LOW (ref 3.87–5.11)
RDW: 12.8 % (ref 11.5–15.5)
WBC: 17.9 10*3/uL — AB (ref 4.0–10.5)
nRBC: 0 % (ref 0.0–0.2)

## 2018-08-14 LAB — CBC
HEMATOCRIT: 33.6 % — AB (ref 36.0–46.0)
Hemoglobin: 11 g/dL — ABNORMAL LOW (ref 12.0–15.0)
MCH: 29.1 pg (ref 26.0–34.0)
MCHC: 32.7 g/dL (ref 30.0–36.0)
MCV: 88.9 fL (ref 80.0–100.0)
Platelets: 284 10*3/uL (ref 150–400)
RBC: 3.78 MIL/uL — ABNORMAL LOW (ref 3.87–5.11)
RDW: 12.9 % (ref 11.5–15.5)
WBC: 18.1 10*3/uL — ABNORMAL HIGH (ref 4.0–10.5)
nRBC: 0 % (ref 0.0–0.2)

## 2018-08-14 LAB — BASIC METABOLIC PANEL
Anion gap: 9 (ref 5–15)
BUN: 13 mg/dL (ref 8–23)
CO2: 22 mmol/L (ref 22–32)
Calcium: 8.7 mg/dL — ABNORMAL LOW (ref 8.9–10.3)
Chloride: 103 mmol/L (ref 98–111)
Creatinine, Ser: 0.84 mg/dL (ref 0.44–1.00)
GFR calc Af Amer: >60 mL/min (ref >60)
GFR calc non Af Amer: 59 mL/min — ABNORMAL LOW (ref >60)
Glucose, Bld: 170 mg/dL — ABNORMAL HIGH (ref 70–99)
Potassium: 3.6 mmol/L (ref 3.5–5.1)
Sodium: 134 mmol/L — ABNORMAL LOW (ref 135–145)

## 2018-08-14 LAB — TROPONIN I: Troponin I: <0.03 ng/mL (ref <0.03)

## 2018-08-14 LAB — LIPASE, BLOOD: Lipase: 107 U/L — ABNORMAL HIGH (ref 11–51)

## 2018-08-14 MED ORDER — IOHEXOL 300 MG/ML  SOLN
75.0000 mL | Freq: Once | INTRAMUSCULAR | Status: AC | PRN
  Administered 2018-08-14: 75 mL via INTRAVENOUS

## 2018-08-14 MED ORDER — IOPAMIDOL (ISOVUE-300) INJECTION 61%
15.0000 mL | INTRAVENOUS | Status: AC
  Administered 2018-08-14 – 2018-08-15 (×2): 15 mL via ORAL

## 2018-08-14 MED ORDER — PIPERACILLIN-TAZOBACTAM 3.375 G IVPB 30 MIN
3.3750 g | Freq: Once | INTRAVENOUS | Status: AC
  Administered 2018-08-14: 3.375 g via INTRAVENOUS
  Filled 2018-08-14: qty 50

## 2018-08-14 MED ORDER — ONDANSETRON HCL 4 MG/2ML IJ SOLN
INTRAMUSCULAR | Status: AC
  Administered 2018-08-14: 4 mg
  Filled 2018-08-14: qty 2

## 2018-08-14 MED ORDER — SODIUM CHLORIDE 0.9% FLUSH
3.0000 mL | Freq: Once | INTRAVENOUS | Status: AC
  Administered 2018-08-14: 3 mL via INTRAVENOUS

## 2018-08-14 NOTE — ED Provider Notes (Signed)
[facility] Emergency Department Provider Note   ____________________________________________   First MD Initiated Contact with Patient 08/14/18 2050     (approximate)  I have reviewed the triage vital signs and the nursing notes.   HISTORY  Chief Complaint Chest Pain    HPI Kylie May is a 85 y.o. female who comes in the emergency room complaining of upper abdominal pain radiating through to the back.  She is very deaf so it is hard to get a good history but between her and her daughter we determined that this pain is like the pain she had when she had her gallbladder disease.  Her white count today is higher than it was last time.  She is not having any nausea vomiting or diarrhea.  She is not running a fever.  She does not appear to be short of breath.  Pain is worse with palpation.  When I palpated it is severe.        Past Medical History:  Diagnosis Date  . A-fib ([place])   . Acid reflux disease   . Arrhythmia   . Chronic diastolic CHF (congestive heart failure) ([place])   . Compression fracture of lumbar vertebra ([place])   . Diverticulitis 01-2008   [facility] [place]  . Diverticulosis   . Esophageal stricture 2006  . Femoral bruit    Right  . Fibrocystic breast disease   . Hiatal hernia   . Hypercholesterolemia    [place] study LDL goal = < 160  . Mitral regurgitation    a. 06/2014 EF 55-60%, elevated end-diastolic pressures, dilated LA at 4.3 cm, mildly dilated RA, severe mitral regurgitation, mild aortic sclerosis without stenosis, mod-severe TR  . Mitral valve prolapse   . Osteoporosis    Dr Matthew Saras  . Pancreatitis 07-2008   Hospitalized   . Schatzki's ring   . Skin cancer    facial x 2. [name]    Patient Active Problem List   Diagnosis Date Noted  . Anemia 07/13/2018  . Right upper quadrant abdominal pain 07/05/2018  . Patella fracture 05/13/2018  . Tooth injury 11/05/2017  . Fall 10/31/2017  . Abdominal aortic  atherosclerosis ([place]) 09/16/2017  . Rib pain 09/04/2017  . Rash 09/04/2017  . Elevated TSH 09/04/2017  . Weight loss 07/16/2017  . Left ankle pain 07/16/2017  . Chronic back pain 01/14/2017  . Scoliosis 10/14/2016  . Frequent falls 04/18/2016  . A-fib ([place])   . Mitral regurgitation   . Chronic diastolic CHF (congestive heart failure) ([place])   . Hearing loss 01/09/2010  . History of skin cancer 12/29/2008  . Hyperlipidemia 08/10/2007  . GERD (gastroesophageal reflux disease) 02/22/2007  . History of esophageal stricture 06/14/2004    Past Surgical History:  Procedure Laterality Date  . COLONOSCOPY  2006  . Epidural steroids     x1  . ESOPHAGEAL DILATION  2006  . G3 P4 (twins)    . SKIN CANCER EXCISION      Prior to Admission medications   Medication Sig Start Date End Date Taking? Authorizing Provider  apixaban (ELIQUIS) 2.5 MG TABS tablet Take 1 tablet (2.5 mg total) by mouth 2 (two) times daily. 08/04/18   Minna Merritts, MD  carvedilol (COREG) 3.125 MG tablet Take 1 tablet (3.125 mg total) by mouth 2 (two) times daily with a meal. 08/04/18   Gollan, Kathlene November, MD  flecainide (TAMBOCOR) 50 MG tablet Take 1 tablet (50 mg total) by mouth 2 (two) times  daily. 08/04/18   Minna Merritts, MD  magnesium chloride (SLOW-MAG) 64 MG TBEC SR tablet Take 2 tablets (128 mg total) by mouth daily. 12/18/17   [name], MD  omeprazole (PRILOSEC) 40 MG capsule Take 1 capsule (40 mg total) by mouth daily. 05/13/18 07/05/18  [name], MD  rosuvastatin (CRESTOR) 20 MG tablet Take 1 tablet (20 mg total) by mouth daily. 08/04/18   Minna Merritts, MD    Allergies Metronidazole and Penicillin g  Family History  Problem Relation Age of Onset  . Breast cancer Maternal Aunt   . Hepatitis Daughter        C  . Lung disease Mother        lung tumor  . Heart attack Daughter 49       S/P stents  . Heart attack Sister 47  . Diabetes Neg Hx   . Stroke Neg Hx     Social History  Social History   Tobacco Use  . Smoking status: Former Smoker    Last attempt to quit: 06/04/1951    Years since quitting: 67.2  . Smokeless tobacco: Never Used  . Tobacco comment: Smoked 1945-1953 , up to 2 cigarettes/day  Substance Use Topics  . Alcohol use: Yes    Alcohol/week: 7.0 standard drinks    Types: 7 Glasses of wine per week  . Drug use: No    Review of Systems  Constitutional: No fever/chills Eyes: No visual changes. ENT: No sore throat. Cardiovascular: Denies chest pain. Respiratory: Denies shortness of breath. Gastrointestinal: Nabdominal pain.  No nausea, no vomiting.  No diarrhea.  No constipation. Genitourinary: Negative for dysuria. Musculoskeletal: Negative for back pain. Skin: Negative for rash. Neurological: Negative for headaches, focal weakness    ____________________________________________   PHYSICAL EXAM:  VITAL SIGNS: ED Triage Vitals [08/14/18 2034]  Enc Vitals Group     BP      Pulse      Resp      Temp      Temp src      SpO2      Weight 105 lb (47.6 kg)     Height 5\' 1"  (1.549 m)     Head Circumference      Peak Flow      Pain Score 10     Pain Loc      Pain Edu?      Excl. in [place]?     Constitutional: Alert and oriented. Well appearing and in no acute distress. Eyes: Conjunctivae are normal.  Head: Atraumatic. Nose: No congestion/rhinnorhea. Mouth/Throat: Mucous membranes are moist.  Oropharynx non-erythematous. Neck: No stridor. Cardiovascular: Normal rate, regular rhythm. Grossly normal heart sounds.  Good peripheral circulation. Respiratory: Normal respiratory effort.  No retractions. Lungs CTAB. Gastrointestinal: Soft tender to palpation across the upper abdomen especially in the epigastric area and right upper quadrant.  No distention. No abdominal bruits. No CVA tenderness. Musculoskeletal: No lower extremity tenderness nor edema.   Neurologic:  Normal speech and language. No gross focal neurologic deficits are  appreciated.  Skin:  Skin is warm, dry and intact. No rash noted.   ____________________________________________   LABS (all labs ordered are listed, but only abnormal results are displayed)  Labs Reviewed  BASIC METABOLIC PANEL - Abnormal; Notable for the following components:      Result Value   Sodium 134 (*)    Glucose, Bld 170 (*)    Calcium 8.7 (*)    GFR calc non Af  Amer 59 (*)    All other components within normal limits  CBC - Abnormal; Notable for the following components:   WBC 18.1 (*)    RBC 3.78 (*)    Hemoglobin 11.0 (*)    HCT 33.6 (*)    All other components within normal limits  LIPASE, BLOOD - Abnormal; Notable for the following components:   Lipase 107 (*)    All other components within normal limits  HEPATIC FUNCTION PANEL - Abnormal; Notable for the following components:   AST 407 (*)    ALT 150 (*)    Alkaline Phosphatase 153 (*)    Total Bilirubin 1.5 (*)    Bilirubin, Direct 0.8 (*)    All other components within normal limits  CBC WITH DIFFERENTIAL/PLATELET - Abnormal; Notable for the following components:   WBC 17.9 (*)    RBC 3.77 (*)    Hemoglobin 11.0 (*)    HCT 33.5 (*)    Neutro Abs 15.6 (*)    All other components within normal limits  TROPONIN I   ____________________________________________  EKG KG read interpreted by me shows normal sinus rhythm rate of 68 normal axis normal EKG  ____________________________________________  RADIOLOGY  ED MD interpretation: Chest x-ray read by radiology reviewed by me shows no acute disease  Official radiology report(s): Dg Chest 2 View  Result Date: 08/14/2018 CLINICAL DATA:  85 year old female with mid chest pain. EXAM: CHEST - 2 VIEW COMPARISON:  CTA chest 07/05/2018 and earlier. FINDINGS: Moderate scoliosis. Stable mild cardiomegaly and tortuosity of the thoracic aorta. Other mediastinal contours are within normal limits. Visualized tracheal air column is within normal limits. No  pneumothorax, pulmonary edema, pleural effusion or acute pulmonary opacity. Negative visible bowel gas pattern. IMPRESSION: Stable.  No acute cardiopulmonary abnormality. Electronically Signed   By: Genevie Ann M.D.   On: 08/14/2018 21:17   Ct Abdomen Pelvis W Contrast  Result Date: 08/15/2018 CLINICAL DATA:  Upper abdominal pain and nausea. History of gallstones and atrial fibrillation. EXAM: CT ABDOMEN AND PELVIS WITH CONTRAST TECHNIQUE: Multidetector CT imaging of the abdomen and pelvis was performed using the standard protocol following bolus administration of intravenous contrast. CONTRAST:  51mL OMNIPAQUE IOHEXOL 300 MG/ML  SOLN COMPARISON:  MRI abdomen 07/06/2018. CT abdomen and pelvis 09/16/2017 FINDINGS: Lower chest: Small left pleural effusion. Atelectasis in both lung bases. Cardiac enlargement. Small esophageal hiatal hernia. Residual contrast material in the lower esophagus may represent reflux or dysmotility. Hepatobiliary: There is prominent intra and extrahepatic bile duct dilatation, increasing since the previous studies. Small stones are demonstrated in the gallbladder. Gallbladder wall is thickened. Gallbladder is mildly distended. Given these findings, this raises suspicion for an occult common bile duct stone although no radiopaque stone is demonstrated. Pancreas: Unremarkable. No pancreatic ductal dilatation or surrounding inflammatory changes. Spleen: Normal in size without focal abnormality. Adrenals/Urinary Tract: Adrenal glands are unremarkable. Kidneys are normal, without renal calculi, focal lesion, or hydronephrosis. Bladder is unremarkable. Stomach/Bowel: Stomach, small bowel, and colon are not abnormally distended. Prominence of gastric rugal folds is unchanged since prior study and could represent gastritis. Scattered stool throughout the colon. Static colonic diverticula. No wall thickening or inflammatory changes. No evidence of diverticulitis. Vascular/Lymphatic: Aortic  atherosclerosis. No enlarged abdominal or pelvic lymph nodes. Reproductive: Uterus and bilateral adnexa are unremarkable. Other: No free air or free fluid in the abdomen. Abdominal wall musculature appears intact. Musculoskeletal: Lumbar scoliosis convex towards the left. Degenerative changes in the lumbar spine. Old fracture deformities of the  right superior and inferior pubic rami. Old compression deformity of L1 vertebra. IMPRESSION: 1. Intra and extrahepatic bile duct dilatation with gallbladder wall thickening and small stones. No radiopaque common bile duct stones are identified although changes are worrisome for an occult obstructing common bile duct stone. Consider ultrasound follow-up. 2. Small esophageal hiatal hernia with residual contrast material in the lower esophagus suggesting reflux or dysmotility. 3. Small left pleural effusion with basilar atelectasis. 4. Prominence of gastric rugal folds is unchanged since prior study and could indicate chronic gastritis. 5. Diverticulosis of the colon without evidence of diverticulitis. 6. Old fracture deformities of the right pubic rami and L1 vertebra. Lumbar scoliosis and degenerative changes. Aortic Atherosclerosis (ICD10-I70.0). Electronically Signed   By: Lucienne Capers M.D.   On: 08/15/2018 00:03    ____________________________________________   PROCEDURES  Procedure(s) performed (including Critical Care):  Procedures   ____________________________________________   INITIAL IMPRESSION / ASSESSMENT AND PLAN / ED COURSE  CT is most consistent with cholecystitis.  We will discussed the patient with surgery and hospitalist.  Get her in the hospital for antibiotics ultrasound and observe her closely.  Last time she did this she improved spontaneously with antibiotics.  She was not felt to be a good surgical wrist at that time.              ____________________________________________   FINAL CLINICAL IMPRESSION(S) / ED  DIAGNOSES  Final diagnoses:  Cholecystitis     ED Discharge Orders    None       Note:  This document was prepared using Dragon voice recognition software and may include unintentional dictation errors.    Nena Polio, MD 08/15/18 [phone]

## 2018-08-14 NOTE — ED Triage Notes (Signed)
Pt arrives POV to triage with c/o mid chest pain since this afternoon. Pt was seen at Urgent Care earlier today for the same. Pt reports hx of gallstones and thinks that this may be that at this time. Pt is experiencing nausea at this time and has hx of a fib.

## 2018-08-15 ENCOUNTER — Observation Stay: Payer: Medicare Other

## 2018-08-15 ENCOUNTER — Inpatient Hospital Stay (HOSPITAL_COMMUNITY)
Admission: AD | Admit: 2018-08-15 | Discharge: 2018-08-26 | DRG: 871 | Disposition: A | Discharge status: Home Health | Payer: Medicare Other | Source: Other Acute Inpatient Hospital | Attending: Internal Medicine | Admitting: Internal Medicine

## 2018-08-15 ENCOUNTER — Encounter (HOSPITAL_COMMUNITY): Payer: Self-pay | Admitting: Internal Medicine

## 2018-08-15 DIAGNOSIS — R740 Nonspecific elevation of levels of transaminase and lactic acid dehydrogenase [LDH]: Secondary | ICD-10-CM | POA: Diagnosis present

## 2018-08-15 DIAGNOSIS — K219 Gastro-esophageal reflux disease without esophagitis: Secondary | ICD-10-CM | POA: Diagnosis present

## 2018-08-15 DIAGNOSIS — I110 Hypertensive heart disease with heart failure: Secondary | ICD-10-CM | POA: Diagnosis present

## 2018-08-15 DIAGNOSIS — K5730 Diverticulosis of large intestine without perforation or abscess without bleeding: Secondary | ICD-10-CM | POA: Diagnosis present

## 2018-08-15 DIAGNOSIS — I700 Atherosclerosis of aorta: Secondary | ICD-10-CM | POA: Diagnosis present

## 2018-08-15 DIAGNOSIS — K8064 Calculus of gallbladder and bile duct with chronic cholecystitis without obstruction: Secondary | ICD-10-CM | POA: Diagnosis present

## 2018-08-15 DIAGNOSIS — Z7901 Long term (current) use of anticoagulants: Secondary | ICD-10-CM

## 2018-08-15 DIAGNOSIS — G8918 Other acute postprocedural pain: Secondary | ICD-10-CM | POA: Diagnosis not present

## 2018-08-15 DIAGNOSIS — K8020 Calculus of gallbladder without cholecystitis without obstruction: Secondary | ICD-10-CM

## 2018-08-15 DIAGNOSIS — K819 Cholecystitis, unspecified: Secondary | ICD-10-CM | POA: Diagnosis not present

## 2018-08-15 DIAGNOSIS — I5032 Chronic diastolic (congestive) heart failure: Secondary | ICD-10-CM | POA: Diagnosis not present

## 2018-08-15 DIAGNOSIS — E785 Hyperlipidemia, unspecified: Secondary | ICD-10-CM | POA: Diagnosis present

## 2018-08-15 DIAGNOSIS — R7401 Elevation of levels of liver transaminase levels: Secondary | ICD-10-CM | POA: Diagnosis present

## 2018-08-15 DIAGNOSIS — A419 Sepsis, unspecified organism: Secondary | ICD-10-CM

## 2018-08-15 DIAGNOSIS — Z85828 Personal history of other malignant neoplasm of skin: Secondary | ICD-10-CM | POA: Diagnosis not present

## 2018-08-15 DIAGNOSIS — Z7189 Other specified counseling: Secondary | ICD-10-CM | POA: Diagnosis not present

## 2018-08-15 DIAGNOSIS — R64 Cachexia: Secondary | ICD-10-CM | POA: Diagnosis present

## 2018-08-15 DIAGNOSIS — K8043 Calculus of bile duct with acute cholecystitis with obstruction: Secondary | ICD-10-CM

## 2018-08-15 DIAGNOSIS — I340 Nonrheumatic mitral (valve) insufficiency: Secondary | ICD-10-CM | POA: Diagnosis not present

## 2018-08-15 DIAGNOSIS — K831 Obstruction of bile duct: Secondary | ICD-10-CM | POA: Diagnosis not present

## 2018-08-15 DIAGNOSIS — K8042 Calculus of bile duct with acute cholecystitis without obstruction: Secondary | ICD-10-CM | POA: Diagnosis not present

## 2018-08-15 DIAGNOSIS — I5033 Acute on chronic diastolic (congestive) heart failure: Secondary | ICD-10-CM | POA: Diagnosis present

## 2018-08-15 DIAGNOSIS — Z87891 Personal history of nicotine dependence: Secondary | ICD-10-CM

## 2018-08-15 DIAGNOSIS — E039 Hypothyroidism, unspecified: Secondary | ICD-10-CM | POA: Diagnosis present

## 2018-08-15 DIAGNOSIS — N6019 Diffuse cystic mastopathy of unspecified breast: Secondary | ICD-10-CM | POA: Diagnosis present

## 2018-08-15 DIAGNOSIS — K449 Diaphragmatic hernia without obstruction or gangrene: Secondary | ICD-10-CM | POA: Diagnosis present

## 2018-08-15 DIAGNOSIS — I361 Nonrheumatic tricuspid (valve) insufficiency: Secondary | ICD-10-CM | POA: Diagnosis not present

## 2018-08-15 DIAGNOSIS — K8062 Calculus of gallbladder and bile duct with acute cholecystitis without obstruction: Secondary | ICD-10-CM | POA: Diagnosis present

## 2018-08-15 DIAGNOSIS — N179 Acute kidney failure, unspecified: Secondary | ICD-10-CM | POA: Diagnosis present

## 2018-08-15 DIAGNOSIS — I4819 Other persistent atrial fibrillation: Secondary | ICD-10-CM | POA: Diagnosis present

## 2018-08-15 DIAGNOSIS — R109 Unspecified abdominal pain: Secondary | ICD-10-CM | POA: Diagnosis not present

## 2018-08-15 DIAGNOSIS — E876 Hypokalemia: Secondary | ICD-10-CM | POA: Diagnosis present

## 2018-08-15 DIAGNOSIS — M419 Scoliosis, unspecified: Secondary | ICD-10-CM | POA: Diagnosis present

## 2018-08-15 DIAGNOSIS — K269 Duodenal ulcer, unspecified as acute or chronic, without hemorrhage or perforation: Secondary | ICD-10-CM | POA: Diagnosis present

## 2018-08-15 DIAGNOSIS — I4891 Unspecified atrial fibrillation: Secondary | ICD-10-CM

## 2018-08-15 DIAGNOSIS — Z8249 Family history of ischemic heart disease and other diseases of the circulatory system: Secondary | ICD-10-CM | POA: Diagnosis not present

## 2018-08-15 DIAGNOSIS — I081 Rheumatic disorders of both mitral and tricuspid valves: Secondary | ICD-10-CM | POA: Diagnosis present

## 2018-08-15 DIAGNOSIS — R6521 Severe sepsis with septic shock: Secondary | ICD-10-CM | POA: Diagnosis present

## 2018-08-15 DIAGNOSIS — R1011 Right upper quadrant pain: Secondary | ICD-10-CM | POA: Diagnosis not present

## 2018-08-15 DIAGNOSIS — R932 Abnormal findings on diagnostic imaging of liver and biliary tract: Secondary | ICD-10-CM | POA: Diagnosis not present

## 2018-08-15 DIAGNOSIS — Z4659 Encounter for fitting and adjustment of other gastrointestinal appliance and device: Secondary | ICD-10-CM | POA: Diagnosis not present

## 2018-08-15 DIAGNOSIS — H9190 Unspecified hearing loss, unspecified ear: Secondary | ICD-10-CM | POA: Diagnosis present

## 2018-08-15 DIAGNOSIS — Z9049 Acquired absence of other specified parts of digestive tract: Secondary | ICD-10-CM | POA: Diagnosis not present

## 2018-08-15 DIAGNOSIS — Z880 Allergy status to penicillin: Secondary | ICD-10-CM

## 2018-08-15 DIAGNOSIS — E782 Mixed hyperlipidemia: Secondary | ICD-10-CM

## 2018-08-15 DIAGNOSIS — R945 Abnormal results of liver function studies: Secondary | ICD-10-CM | POA: Diagnosis not present

## 2018-08-15 DIAGNOSIS — K8050 Calculus of bile duct without cholangitis or cholecystitis without obstruction: Secondary | ICD-10-CM

## 2018-08-15 DIAGNOSIS — K8071 Calculus of gallbladder and bile duct without cholecystitis with obstruction: Secondary | ICD-10-CM | POA: Diagnosis not present

## 2018-08-15 DIAGNOSIS — K8033 Calculus of bile duct with acute cholangitis with obstruction: Secondary | ICD-10-CM | POA: Diagnosis not present

## 2018-08-15 DIAGNOSIS — M810 Age-related osteoporosis without current pathological fracture: Secondary | ICD-10-CM | POA: Diagnosis present

## 2018-08-15 DIAGNOSIS — R579 Shock, unspecified: Secondary | ICD-10-CM | POA: Diagnosis not present

## 2018-08-15 DIAGNOSIS — R000 Tachycardia, unspecified: Secondary | ICD-10-CM | POA: Diagnosis not present

## 2018-08-15 DIAGNOSIS — Z6826 Body mass index (BMI) 26.0-26.9, adult: Secondary | ICD-10-CM

## 2018-08-15 DIAGNOSIS — Z79899 Other long term (current) drug therapy: Secondary | ICD-10-CM | POA: Diagnosis not present

## 2018-08-15 DIAGNOSIS — Z888 Allergy status to other drugs, medicaments and biological substances status: Secondary | ICD-10-CM

## 2018-08-15 DIAGNOSIS — E7800 Pure hypercholesterolemia, unspecified: Secondary | ICD-10-CM | POA: Diagnosis present

## 2018-08-15 DIAGNOSIS — K8309 Other cholangitis: Secondary | ICD-10-CM | POA: Diagnosis not present

## 2018-08-15 HISTORY — DX: Sepsis, unspecified organism: A41.9

## 2018-08-15 HISTORY — DX: Unspecified atrial fibrillation: I48.91

## 2018-08-15 LAB — CBC
HCT: 29.2 % — ABNORMAL LOW (ref 36.0–46.0)
HEMOGLOBIN: 9.8 g/dL — AB (ref 12.0–15.0)
MCH: 29.1 pg (ref 26.0–34.0)
MCHC: 33.6 g/dL (ref 30.0–36.0)
MCV: 86.6 fL (ref 80.0–100.0)
Platelets: 216 10*3/uL (ref 150–400)
RBC: 3.37 MIL/uL — ABNORMAL LOW (ref 3.87–5.11)
RDW: 13 % (ref 11.5–15.5)
WBC: 16.8 10*3/uL — ABNORMAL HIGH (ref 4.0–10.5)
nRBC: 0 % (ref 0.0–0.2)

## 2018-08-15 LAB — GLUCOSE, CAPILLARY: Glucose-Capillary: 99 mg/dL (ref 70–99)

## 2018-08-15 LAB — COMPREHENSIVE METABOLIC PANEL
ALT: 153 U/L — ABNORMAL HIGH (ref 0–44)
AST: 180 U/L — ABNORMAL HIGH (ref 15–41)
Albumin: 2.7 g/dL — ABNORMAL LOW (ref 3.5–5.0)
Alkaline Phosphatase: 136 U/L — ABNORMAL HIGH (ref 38–126)
Anion gap: 9 (ref 5–15)
BUN: 17 mg/dL (ref 8–23)
CO2: 21 mmol/L — ABNORMAL LOW (ref 22–32)
Calcium: 7.9 mg/dL — ABNORMAL LOW (ref 8.9–10.3)
Chloride: 103 mmol/L (ref 98–111)
Creatinine, Ser: 1.36 mg/dL — ABNORMAL HIGH (ref 0.44–1.00)
GFR calc Af Amer: 39 mL/min — ABNORMAL LOW (ref >60)
GFR, EST NON AFRICAN AMERICAN: 33 mL/min — AB (ref >60)
Glucose, Bld: 99 mg/dL (ref 70–99)
Potassium: 3.8 mmol/L (ref 3.5–5.1)
Sodium: 133 mmol/L — ABNORMAL LOW (ref 135–145)
Total Bilirubin: 3.6 mg/dL — ABNORMAL HIGH (ref 0.3–1.2)
Total Protein: 5.6 g/dL — ABNORMAL LOW (ref 6.5–8.1)

## 2018-08-15 LAB — HEPATIC FUNCTION PANEL
ALT: 182 U/L — ABNORMAL HIGH (ref 0–44)
AST: 242 U/L — ABNORMAL HIGH (ref 15–41)
Albumin: 2.9 g/dL — ABNORMAL LOW (ref 3.5–5.0)
Alkaline Phosphatase: 138 U/L — ABNORMAL HIGH (ref 38–126)
Bilirubin, Direct: 2.8 mg/dL — ABNORMAL HIGH (ref 0.0–0.2)
Indirect Bilirubin: 1 mg/dL — ABNORMAL HIGH (ref 0.3–0.9)
Total Bilirubin: 3.8 mg/dL — ABNORMAL HIGH (ref 0.3–1.2)
Total Protein: 6 g/dL — ABNORMAL LOW (ref 6.5–8.1)

## 2018-08-15 LAB — CBC WITH DIFFERENTIAL/PLATELET
Abs Immature Granulocytes: 0.21 10*3/uL — ABNORMAL HIGH (ref 0.00–0.07)
BASOS PCT: 0 %
Basophils Absolute: 0 10*3/uL (ref 0.0–0.1)
Eosinophils Absolute: 0 10*3/uL (ref 0.0–0.5)
Eosinophils Relative: 0 %
HCT: 29.9 % — ABNORMAL LOW (ref 36.0–46.0)
Hemoglobin: 9.6 g/dL — ABNORMAL LOW (ref 12.0–15.0)
Immature Granulocytes: 1 %
Lymphocytes Relative: 7 %
Lymphs Abs: 1 10*3/uL (ref 0.7–4.0)
MCH: 28.2 pg (ref 26.0–34.0)
MCHC: 32.1 g/dL (ref 30.0–36.0)
MCV: 87.9 fL (ref 80.0–100.0)
Monocytes Absolute: 0.9 10*3/uL (ref 0.1–1.0)
Monocytes Relative: 6 %
Neutro Abs: 12.9 10*3/uL — ABNORMAL HIGH (ref 1.7–7.7)
Neutrophils Relative %: 86 %
PLATELETS: 203 10*3/uL (ref 150–400)
RBC: 3.4 MIL/uL — ABNORMAL LOW (ref 3.87–5.11)
RDW: 13.1 % (ref 11.5–15.5)
WBC: 15 10*3/uL — AB (ref 4.0–10.5)
nRBC: 0 % (ref 0.0–0.2)

## 2018-08-15 LAB — BRAIN NATRIURETIC PEPTIDE: B Natriuretic Peptide: 1005.7 pg/mL — ABNORMAL HIGH (ref 0.0–100.0)

## 2018-08-15 LAB — PROTIME-INR
INR: 1.3 — ABNORMAL HIGH (ref 0.8–1.2)
Prothrombin Time: 16.4 seconds — ABNORMAL HIGH (ref 11.4–15.2)

## 2018-08-15 LAB — TSH: TSH: 9.124 u[IU]/mL — ABNORMAL HIGH (ref 0.350–4.500)

## 2018-08-15 LAB — MAGNESIUM: MAGNESIUM: 1.1 mg/dL — AB (ref 1.7–2.4)

## 2018-08-15 MED ORDER — ONDANSETRON HCL 4 MG PO TABS: 4.0000 mg | ORAL_TABLET | Freq: Four times a day (QID) | ORAL | Status: DC | PRN

## 2018-08-15 MED ORDER — METRONIDAZOLE IN NACL 5-0.79 MG/ML-% IV SOLN: 500.0000 mg | Freq: Three times a day (TID) | INTRAVENOUS | Status: DC

## 2018-08-15 MED ORDER — SODIUM CHLORIDE 0.9 % IV SOLN
2.0000 g | INTRAVENOUS | Status: DC
  Filled 2018-08-15: qty 20

## 2018-08-15 MED ORDER — GADOBUTROL 1 MMOL/ML IV SOLN
4.0000 mL | Freq: Once | INTRAVENOUS | Status: AC | PRN
  Administered 2018-08-15: 4 mL via INTRAVENOUS

## 2018-08-15 MED ORDER — ACETAMINOPHEN 650 MG RE SUPP: 650.0000 mg | Freq: Four times a day (QID) | RECTAL | Status: DC | PRN

## 2018-08-15 MED ORDER — ACETAMINOPHEN 325 MG PO TABS
650.0000 mg | ORAL_TABLET | Freq: Four times a day (QID) | ORAL | Status: DC | PRN
  Administered 2018-08-15: 650 mg via ORAL
  Filled 2018-08-15: qty 2

## 2018-08-15 MED ORDER — DILTIAZEM HCL 25 MG/5ML IV SOLN
5.0000 mg | Freq: Once | INTRAVENOUS | Status: AC
  Administered 2018-08-15: 5 mg via INTRAVENOUS
  Filled 2018-08-15: qty 5

## 2018-08-15 MED ORDER — PIPERACILLIN-TAZOBACTAM 3.375 G IVPB 30 MIN
3.3750 g | Freq: Four times a day (QID) | INTRAVENOUS | Status: DC
  Administered 2018-08-15: 3.375 g via INTRAVENOUS
  Filled 2018-08-15 (×6): qty 50

## 2018-08-15 MED ORDER — APIXABAN 2.5 MG PO TABS
2.5000 mg | ORAL_TABLET | Freq: Two times a day (BID) | ORAL | Status: DC
  Filled 2018-08-15 (×2): qty 1

## 2018-08-15 MED ORDER — PANTOPRAZOLE SODIUM 40 MG PO TBEC
40.0000 mg | DELAYED_RELEASE_TABLET | Freq: Every day | ORAL | Status: DC
  Administered 2018-08-15: 40 mg via ORAL
  Filled 2018-08-15: qty 1

## 2018-08-15 MED ORDER — MAGNESIUM CHLORIDE 64 MG PO TBEC
2.0000 | DELAYED_RELEASE_TABLET | Freq: Every day | ORAL | Status: DC
  Administered 2018-08-15: 128 mg via ORAL
  Filled 2018-08-15 (×2): qty 2

## 2018-08-15 MED ORDER — MORPHINE SULFATE (PF) 2 MG/ML IV SOLN
0.5000 mg | Freq: Four times a day (QID) | INTRAVENOUS | Status: DC | PRN
  Administered 2018-08-16 – 2018-08-17 (×2): 0.5 mg via INTRAVENOUS
  Filled 2018-08-15 (×3): qty 1

## 2018-08-15 MED ORDER — DOCUSATE SODIUM 100 MG PO CAPS
100.0000 mg | ORAL_CAPSULE | Freq: Two times a day (BID) | ORAL | Status: DC
  Administered 2018-08-15: 100 mg via ORAL
  Filled 2018-08-15: qty 1

## 2018-08-15 MED ORDER — KETOROLAC TROMETHAMINE 15 MG/ML IJ SOLN: 15.0000 mg | Freq: Four times a day (QID) | INTRAMUSCULAR | Status: DC | PRN

## 2018-08-15 MED ORDER — MORPHINE SULFATE (PF) 2 MG/ML IV SOLN
2.0000 mg | INTRAVENOUS | Status: DC | PRN
  Administered 2018-08-15: 2 mg via INTRAVENOUS
  Filled 2018-08-15 (×2): qty 1

## 2018-08-15 MED ORDER — METRONIDAZOLE IN NACL 5-0.79 MG/ML-% IV SOLN
500.0000 mg | Freq: Three times a day (TID) | INTRAVENOUS | Status: DC
  Filled 2018-08-15 (×3): qty 100

## 2018-08-15 MED ORDER — PIPERACILLIN-TAZOBACTAM 3.375 G IVPB
3.3750 g | Freq: Three times a day (TID) | INTRAVENOUS | Status: DC
  Administered 2018-08-15 – 2018-08-23 (×23): 3.375 g via INTRAVENOUS
  Filled 2018-08-15 (×24): qty 50

## 2018-08-15 MED ORDER — MORPHINE SULFATE (PF) 2 MG/ML IV SOLN
1.0000 mg | INTRAVENOUS | Status: DC | PRN
  Administered 2018-08-15: 1 mg via INTRAVENOUS
  Filled 2018-08-15: qty 1

## 2018-08-15 MED ORDER — FLECAINIDE ACETATE 50 MG PO TABS
50.0000 mg | ORAL_TABLET | Freq: Two times a day (BID) | ORAL | Status: DC
  Administered 2018-08-15 (×2): 50 mg via ORAL
  Filled 2018-08-15 (×4): qty 1

## 2018-08-15 MED ORDER — CARVEDILOL 6.25 MG PO TABS
3.1250 mg | ORAL_TABLET | Freq: Two times a day (BID) | ORAL | Status: DC
  Administered 2018-08-15: 3.125 mg via ORAL
  Filled 2018-08-15: qty 1

## 2018-08-15 MED ORDER — ONDANSETRON HCL 4 MG/2ML IJ SOLN
4.0000 mg | Freq: Four times a day (QID) | INTRAMUSCULAR | Status: DC | PRN
  Administered 2018-08-15: 4 mg via INTRAVENOUS
  Filled 2018-08-15: qty 2

## 2018-08-15 MED ORDER — SODIUM CHLORIDE 0.9 % IV SOLN
INTRAVENOUS | Status: DC
  Administered 2018-08-15 – 2018-08-17 (×4): via INTRAVENOUS

## 2018-08-15 MED ORDER — DIPHENHYDRAMINE HCL 50 MG/ML IJ SOLN
12.5000 mg | Freq: Three times a day (TID) | INTRAMUSCULAR | Status: DC | PRN
  Filled 2018-08-15: qty 1

## 2018-08-15 MED ORDER — SODIUM CHLORIDE 0.9 % IV SOLN
INTRAVENOUS | Status: DC
  Administered 2018-08-15: 04:00:00 via INTRAVENOUS

## 2018-08-15 MED ORDER — MAGNESIUM SULFATE 2 GM/50ML IV SOLN
2.0000 g | Freq: Once | INTRAVENOUS | Status: AC
  Administered 2018-08-15: 2 g via INTRAVENOUS

## 2018-08-15 MED ORDER — SODIUM CHLORIDE 0.9 % IV SOLN
2.0000 g | INTRAVENOUS | Status: DC
  Administered 2018-08-15: 2 g via INTRAVENOUS
  Filled 2018-08-15: qty 2

## 2018-08-15 NOTE — ED Notes (Signed)
Pt to go to mri and from there to room 217

## 2018-08-15 NOTE — ED Notes (Signed)
ED TO INPATIENT HANDOFF REPORT  ED Nurse Name and Phone #: Jenny Reichmann [phone]  S Name/Age/Gender Kylie May 85 y.o. female Room/Bed: ED18A/ED18A  Code Status   Code Status: Prior  Home/SNF/Other Home Patient oriented to: self, place, time and situation Is this baseline? Yes   Triage Complete: Triage complete  Chief Complaint Possible gallstone-sent by urgent care  Triage Note Pt arrives POV to triage with c/o mid chest pain since this afternoon. Pt was seen at Urgent Care earlier today for the same. Pt reports hx of gallstones and thinks that this may be that at this time. Pt is experiencing nausea at this time and has hx of a fib.   Allergies Allergies  Allergen Reactions  . Metronidazole     Other reaction(s): Dizziness and giddiness (finding), Other (qualifier value) made things feel like they were vibrating like the bed and floor,also caused mild dizziness weakness  . Penicillin G Rash    Level of Care/Admitting Diagnosis ED Disposition    ED Disposition Condition [facility] Area: [place] [[id number]]  Level of Care: Med-Surg [16]  Diagnosis: RUQ pain [220254]  Admitting Physician: Harrie Foreman [[id number]]  Attending Physician: Harrie Foreman [phone]  PT Class (Do Not Modify): Observation [104]  PT Acc Code (Do Not Modify): Observation [10022]       B Medical/Surgery History Past Medical History:  Diagnosis Date  . A-fib ([place])   . Acid reflux disease   . Arrhythmia   . Chronic diastolic CHF (congestive heart failure) ([place])   . Compression fracture of lumbar vertebra ([place])   . Diverticulitis 01-2008   [facility]  . Diverticulosis   . Esophageal stricture 2006  . Femoral bruit    Right  . Fibrocystic breast disease   . Hiatal hernia   . Hypercholesterolemia    [place] study LDL goal = < 160  . Mitral regurgitation    a. 06/2014 EF 55-60%, elevated end-diastolic pressures, dilated LA at 4.3 cm,  mildly dilated RA, severe mitral regurgitation, mild aortic sclerosis without stenosis, mod-severe TR  . Mitral valve prolapse   . Osteoporosis    Dr Matthew Saras  . Pancreatitis 07-2008   Hospitalized   . Schatzki's ring   . Skin cancer    facial x 2. [name]   Past Surgical History:  Procedure Laterality Date  . COLONOSCOPY  2006  . Epidural steroids     x1  . ESOPHAGEAL DILATION  2006  . G3 P4 (twins)    . SKIN CANCER EXCISION       A IV Location/Drains/Wounds Patient Lines/Drains/Airways Status   Active Line/Drains/Airways    Name:   Placement date:   Placement time:   Site:   Days:   Peripheral IV 08/14/18 Left Antecubital   08/14/18    2055    Antecubital   1          Intake/Output Last 24 hours No intake or output data in the 24 hours ending 08/15/18 0146  Labs/Imaging Results for orders placed or performed during the hospital encounter of 08/14/18 (from the past 48 hour(s))  Basic metabolic panel     Status: Abnormal   Collection Time: 08/14/18  8:55 PM  Result Value Ref Range   Sodium 134 (L) 135 - 145 mmol/L   Potassium 3.6 3.5 - 5.1 mmol/L   Chloride 103 98 - 111 mmol/L   CO2 22 22 - 32 mmol/L   Glucose, Bld  170 (H) 70 - 99 mg/dL   BUN 13 8 - 23 mg/dL   Creatinine, Ser 0.84 0.44 - 1.00 mg/dL   Calcium 8.7 (L) 8.9 - 10.3 mg/dL   GFR calc non Af Amer 59 (L) >60 mL/min   GFR calc Af Amer >60 >60 mL/min   Anion gap 9 5 - 15    Comment: Performed at [facility], [address] [place], [place] [address]  CBC     Status: Abnormal   Collection Time: 08/14/18  8:55 PM  Result Value Ref Range   WBC 18.1 (H) 4.0 - 10.5 K/uL   RBC 3.78 (L) 3.87 - 5.11 MIL/uL   Hemoglobin 11.0 (L) 12.0 - 15.0 g/dL   HCT 33.6 (L) 36.0 - 46.0 %   MCV 88.9 80.0 - 100.0 fL   MCH 29.1 26.0 - 34.0 pg   MCHC 32.7 30.0 - 36.0 g/dL   RDW 12.9 11.5 - 15.5 %   Platelets 284 150 - 400 K/uL   nRBC 0.0 0.0 - 0.2 %    Comment: Performed at [facility], [address] [place], [place] [address]  Troponin I - ONCE - STAT     Status: None   Collection Time: 08/14/18  8:55 PM  Result Value Ref Range   Troponin I <0.03 <0.03 ng/mL    Comment: Performed at [facility], [address] [place], [place] [address]  Lipase, blood     Status: Abnormal   Collection Time: 08/14/18  8:55 PM  Result Value Ref Range   Lipase 107 (H) 11 - 51 U/L    Comment: Performed at [facility], [address] [place], [place] [address]  Hepatic function panel     Status: Abnormal   Collection Time: 08/14/18  8:55 PM  Result Value Ref Range   Total Protein 6.7 6.5 - 8.1 g/dL   Albumin 3.6 3.5 - 5.0 g/dL   AST 407 (H) 15 - 41 U/L   ALT 150 (H) 0 - 44 U/L   Alkaline Phosphatase 153 (H) 38 - 126 U/L   Total Bilirubin 1.5 (H) 0.3 - 1.2 mg/dL   Bilirubin, Direct 0.8 (H) 0.0 - 0.2 mg/dL   Indirect Bilirubin 0.7 0.3 - 0.9 mg/dL    Comment: Performed at [facility], [address] [place], [place] [address]  CBC with Differential/Platelet     Status: Abnormal   Collection Time: 08/14/18  8:55 PM  Result Value Ref Range   WBC 17.9 (H) 4.0 - 10.5 K/uL   RBC 3.77 (L) 3.87 - 5.11 MIL/uL   Hemoglobin 11.0 (L) 12.0 - 15.0 g/dL   HCT 33.5 (L) 36.0 - 46.0 %   MCV 88.9 80.0 - 100.0 fL   MCH 29.2 26.0 - 34.0 pg   MCHC 32.8 30.0 - 36.0 g/dL   RDW 12.8 11.5 - 15.5 %   Platelets 292 150 - 400 K/uL   nRBC 0.0 0.0 - 0.2 %   Neutrophils Relative % 88 %   Neutro Abs 15.6 (H) 1.7 - 7.7 K/uL   Lymphocytes Relative 9 %   Lymphs Abs 1.6 0.7 - 4.0 K/uL   Monocytes Relative 2 %   Monocytes Absolute 0.4 0.1 - 1.0 K/uL   Eosinophils Relative 1 %   Eosinophils Absolute 0.1 0.0 - 0.5 K/uL   Basophils Relative 0 %   Basophils Absolute 0.1 0.0 - 0.1 K/uL   Immature Granulocytes 0 %   Abs  Immature Granulocytes 0.06 0.00 - 0.07 K/uL    Comment: Performed at [facility], [address] [place], [place] [address]   Dg Chest 2 View  Result Date:  08/14/2018 CLINICAL DATA:  85 year old female with mid chest pain. EXAM: CHEST - 2 VIEW COMPARISON:  CTA chest 07/05/2018 and earlier. FINDINGS: Moderate scoliosis. Stable mild cardiomegaly and tortuosity of the thoracic aorta. Other mediastinal contours are within normal limits. Visualized tracheal air column is within normal limits. No pneumothorax, pulmonary edema, pleural effusion or acute pulmonary opacity. Negative visible bowel gas pattern. IMPRESSION: Stable.  No acute cardiopulmonary abnormality. Electronically Signed   By: Genevie Ann M.D.   On: 08/14/2018 21:17   Ct Abdomen Pelvis W Contrast  Result Date: 08/15/2018 CLINICAL DATA:  Upper abdominal pain and nausea. History of gallstones and atrial fibrillation. EXAM: CT ABDOMEN AND PELVIS WITH CONTRAST TECHNIQUE: Multidetector CT imaging of the abdomen and pelvis was performed using the standard protocol following bolus administration of intravenous contrast. CONTRAST:  35mL OMNIPAQUE IOHEXOL 300 MG/ML  SOLN COMPARISON:  MRI abdomen 07/06/2018. CT abdomen and pelvis 09/16/2017 FINDINGS: Lower chest: Small left pleural effusion. Atelectasis in both lung bases. Cardiac enlargement. Small esophageal hiatal hernia. Residual contrast material in the lower esophagus may represent reflux or dysmotility. Hepatobiliary: There is prominent intra and extrahepatic bile duct dilatation, increasing since the previous studies. Small stones are demonstrated in the gallbladder. Gallbladder wall is thickened. Gallbladder is mildly distended. Given these findings, this raises suspicion for an occult common bile duct stone although no radiopaque stone is demonstrated. Pancreas: Unremarkable. No pancreatic ductal dilatation or surrounding inflammatory changes. Spleen: Normal in size without focal abnormality. Adrenals/Urinary Tract: Adrenal glands are unremarkable. Kidneys are normal, without renal calculi, focal lesion, or hydronephrosis. Bladder is unremarkable.  Stomach/Bowel: Stomach, small bowel, and colon are not abnormally distended. Prominence of gastric rugal folds is unchanged since prior study and could represent gastritis. Scattered stool throughout the colon. Static colonic diverticula. No wall thickening or inflammatory changes. No evidence of diverticulitis. Vascular/Lymphatic: Aortic atherosclerosis. No enlarged abdominal or pelvic lymph nodes. Reproductive: Uterus and bilateral adnexa are unremarkable. Other: No free air or free fluid in the abdomen. Abdominal wall musculature appears intact. Musculoskeletal: Lumbar scoliosis convex towards the left. Degenerative changes in the lumbar spine. Old fracture deformities of the right superior and inferior pubic rami. Old compression deformity of L1 vertebra. IMPRESSION: 1. Intra and extrahepatic bile duct dilatation with gallbladder wall thickening and small stones. No radiopaque common bile duct stones are identified although changes are worrisome for an occult obstructing common bile duct stone. Consider ultrasound follow-up. 2. Small esophageal hiatal hernia with residual contrast material in the lower esophagus suggesting reflux or dysmotility. 3. Small left pleural effusion with basilar atelectasis. 4. Prominence of gastric rugal folds is unchanged since prior study and could indicate chronic gastritis. 5. Diverticulosis of the colon without evidence of diverticulitis. 6. Old fracture deformities of the right pubic rami and L1 vertebra. Lumbar scoliosis and degenerative changes. Aortic Atherosclerosis (ICD10-I70.0). Electronically Signed   By: Lucienne Capers M.D.   On: 08/15/2018 00:03    Pending Labs [employer] (From admission, onward)    Start     Ordered   Signed and Held  TSH  Add-on,   R     Signed and Held          Vitals/Pain Today's Vitals   08/15/18 0046 08/15/18 0100 08/15/18 0115 08/15/18 0130  BP: (!) 129/96 (!) 141/100 (!) 139/92 [name])  146/82  Pulse: (!) 142  (!) 137 98   Resp: (!) 32 (!) 30 (!) 34 (!) 32  SpO2: 91%  95% 96%  Weight:      Height:      PainSc: 5        Isolation Precautions No active isolations  Medications Medications  apixaban (ELIQUIS) tablet 2.5 mg (has no administration in time range)  carvedilol (COREG) tablet 3.125 mg (has no administration in time range)  flecainide (TAMBOCOR) tablet 50 mg (has no administration in time range)  magnesium chloride (SLOW-MAG) 64 MG SR tablet 128 mg (has no administration in time range)  pantoprazole (PROTONIX) EC tablet 40 mg (has no administration in time range)  diltiazem (CARDIZEM) injection 5 mg (has no administration in time range)  sodium chloride flush (NS) 0.9 % injection 3 mL (3 mLs Intravenous Given 08/14/18 2102)  piperacillin-tazobactam (ZOSYN) IVPB 3.375 g (0 g Intravenous Stopped 08/14/18 2242)  iopamidol (ISOVUE-300) 61 % injection 15 mL (15 mLs Oral Contrast Given 08/15/18 0139)  ondansetron (ZOFRAN) 4 MG/2ML injection (4 mg  Given 08/14/18 2242)  iohexol (OMNIPAQUE) 300 MG/ML solution 75 mL (75 mLs Intravenous Contrast Given 08/14/18 2341)    Mobility walks Moderate fall risk   Focused Assessments Cardiac Assessment Handoff:    Lab Results  Component Value Date   CKTOTAL 42 09/16/2014   CKMB 1.9 09/16/2014   TROPONINI <0.03 08/14/2018   No results found for: DDIMER Does the Patient currently have chest pain? No     R Recommendations: See Admitting Provider Note  Report given to:   Additional Notes: none

## 2018-08-15 NOTE — ED Provider Notes (Signed)
She has gone into A. fib with RVR at a rate ranging from 1 35-1 50.  She is feeling okay.  We will try to get her back to sinus rhythm with some diltiazem.  Before the diltiazem is given patient goes back into sinus rhythm at 100.  She is going in and out now.  We will give her the diltiazem pharmacy will reconcile her meds and will give her her home meds I have restarted her home meds.  Dr. Marius Ditch asked for an MRCP, I will order this.   Nena Polio, MD 08/15/18 [phone]

## 2018-08-15 NOTE — Progress Notes (Signed)
Called by Dr Lysle Pearl about this patient due to abnormal LFTs, lipase 107 and CT A/P showing prominent  intra and extrahepatic bile duct dilatation, increasing since the previous studies. Small stones are demonstrated in the gallbladder. Gallbladder wall is thickened. Gallbladder is mildly distended. Given these findings, this raises suspicion for an occult common bile duct stone.  I recommended MRCP. Nurse called me with results sent over by the radiology on paper. Results were not up in epic yet. Showed dilated cbd 5mm, suspected 66mm distal cbd stone. Recommend ERCP which I do not perform. My colleague Dr Allen Norris is not available and not on call who performs ERCP. Therefore, recommend urgent transfer to [facility] or [facility] or [facility]  Strict NPO Hold Eliquis Continue antibiotics, recommend for gram negative and anaerobic coverage as she has significant leukocytosis and chills  Communicated with Dr Lysle Pearl and Dr Marcille Blanco  Cephas Darby, MD [address]  [place]  [place], [place] [address]  Main: [phone]  Fax: [phone] Pager: [phone]

## 2018-08-15 NOTE — H&P (Signed)
Kylie May is an 85 y.o. female.   Chief Complaint: Chest pain HPI: The patient with past medical history of diastolic heart failure, GERD, Schatzki's ring, diverticulitis and A. fib presents to the emergency department complaining of chest pain.  The patient reports that the pain radiates to her back.  Notably the patient reported to the ED staff that the pain felt similar to when she had her gallbladder removed.  CT of her abdomen showed intra-and hepatic duct dilatation and gallbladder wall thickening but no stones.  (The patient also has a small hiatal hernia).  She was also noted to have atrial fibrillation with rapid ventricular rate that improved following diltiazem.  Laboratory evaluation also revealed transaminitis.  Due to concern for remaining gallbladder stones the emergency department staff called the hospitalist service for admission.  Past Medical History:  Diagnosis Date  . A-fib ([place])   . Acid reflux disease   . Arrhythmia   . Chronic diastolic CHF (congestive heart failure) ([place])   . Compression fracture of lumbar vertebra ([place])   . Diverticulitis 01-2008   [facility] [place]  . Diverticulosis   . Esophageal stricture 2006  . Femoral bruit    Right  . Fibrocystic breast disease   . Hiatal hernia   . Hypercholesterolemia    [place] study LDL goal = < 160  . Mitral regurgitation    a. 06/2014 EF 55-60%, elevated end-diastolic pressures, dilated LA at 4.3 cm, mildly dilated RA, severe mitral regurgitation, mild aortic sclerosis without stenosis, mod-severe TR  . Mitral valve prolapse   . Osteoporosis    Dr Matthew Saras  . Pancreatitis 07-2008   Hospitalized   . Schatzki's ring   . Skin cancer    facial x 2. [name]    Past Surgical History:  Procedure Laterality Date  . COLONOSCOPY  2006  . Epidural steroids     x1  . ESOPHAGEAL DILATION  2006  . G3 P4 (twins)    . SKIN CANCER EXCISION      Family History  Problem Relation Age of Onset  . Breast cancer  Maternal Aunt   . Hepatitis Daughter        C  . Lung disease Mother        lung tumor  . Heart attack Daughter 25       S/P stents  . Heart attack Sister 7  . Diabetes Neg Hx   . Stroke Neg Hx    Social History:  reports that she quit smoking about 67 years ago. She has never used smokeless tobacco. She reports current alcohol use of about 7.0 standard drinks of alcohol per week. She reports that she does not use drugs.  Allergies:  Allergies  Allergen Reactions  . Metronidazole     Other reaction(s): Dizziness and giddiness (finding), Other (qualifier value) made things feel like they were vibrating like the bed and floor,also caused mild dizziness weakness  . Penicillin G Rash    Medications Prior to Admission  Medication Sig Dispense Refill  . apixaban (ELIQUIS) 2.5 MG TABS tablet Take 1 tablet (2.5 mg total) by mouth 2 (two) times daily. 120 tablet 0  . carvedilol (COREG) 3.125 MG tablet Take 1 tablet (3.125 mg total) by mouth 2 (two) times daily with a meal. 180 tablet 1  . flecainide (TAMBOCOR) 50 MG tablet Take 1 tablet (50 mg total) by mouth 2 (two) times daily. 180 tablet 1  . magnesium chloride (SLOW-MAG) 64 MG TBEC SR tablet  Take 2 tablets (128 mg total) by mouth daily. 60 tablet 3  . omeprazole (PRILOSEC) 40 MG capsule Take 1 capsule (40 mg total) by mouth daily. 90 capsule 1  . rosuvastatin (CRESTOR) 20 MG tablet Take 1 tablet (20 mg total) by mouth daily. 90 tablet 1  . triamcinolone ointment (KENALOG) 0.1 %       Results for orders placed or performed during the hospital encounter of 08/14/18 (from the past 48 hour(s))  Basic metabolic panel     Status: Abnormal   Collection Time: 08/14/18  8:55 PM  Result Value Ref Range   Sodium 134 (L) 135 - 145 mmol/L   Potassium 3.6 3.5 - 5.1 mmol/L   Chloride 103 98 - 111 mmol/L   CO2 22 22 - 32 mmol/L   Glucose, Bld 170 (H) 70 - 99 mg/dL   BUN 13 8 - 23 mg/dL   Creatinine, Ser 0.84 0.44 - 1.00 mg/dL   Calcium 8.7  (L) 8.9 - 10.3 mg/dL   GFR calc non Af Amer 59 (L) >60 mL/min   GFR calc Af Amer >60 >60 mL/min   Anion gap 9 5 - 15    Comment: Performed at [facility], [address] [place], [place] [address]  CBC     Status: Abnormal   Collection Time: 08/14/18  8:55 PM  Result Value Ref Range   WBC 18.1 (H) 4.0 - 10.5 K/uL   RBC 3.78 (L) 3.87 - 5.11 MIL/uL   Hemoglobin 11.0 (L) 12.0 - 15.0 g/dL   HCT 33.6 (L) 36.0 - 46.0 %   MCV 88.9 80.0 - 100.0 fL   MCH 29.1 26.0 - 34.0 pg   MCHC 32.7 30.0 - 36.0 g/dL   RDW 12.9 11.5 - 15.5 %   Platelets 284 150 - 400 K/uL   nRBC 0.0 0.0 - 0.2 %    Comment: Performed at [facility], [address] [place], [place] [address]  Troponin I - ONCE - STAT     Status: None   Collection Time: 08/14/18  8:55 PM  Result Value Ref Range   Troponin I <0.03 <0.03 ng/mL    Comment: Performed at [facility], [address] [place], [place] [address]  Lipase, blood     Status: Abnormal   Collection Time: 08/14/18  8:55 PM  Result Value Ref Range   Lipase 107 (H) 11 - 51 U/L    Comment: Performed at [facility], [address] [place], [place] [address]  Hepatic function panel     Status: Abnormal   Collection Time: 08/14/18  8:55 PM  Result Value Ref Range   Total Protein 6.7 6.5 - 8.1 g/dL   Albumin 3.6 3.5 - 5.0 g/dL   AST 407 (H) 15 - 41 U/L   ALT 150 (H) 0 - 44 U/L   Alkaline Phosphatase 153 (H) 38 - 126 U/L   Total Bilirubin 1.5 (H) 0.3 - 1.2 mg/dL   Bilirubin, Direct 0.8 (H) 0.0 - 0.2 mg/dL   Indirect Bilirubin 0.7 0.3 - 0.9 mg/dL    Comment: Performed at [facility], [address] [place], [place] [address]  CBC with Differential/Platelet     Status: Abnormal   Collection Time: 08/14/18  8:55 PM  Result Value Ref Range   WBC 17.9 (H) 4.0 - 10.5 K/uL   RBC 3.77 (L) 3.87 - 5.11 MIL/uL   Hemoglobin 11.0 (L) 12.0 - 15.0 g/dL   HCT  33.5 (L) 36.0 - 46.0 %   MCV 88.9 80.0 - 100.0 fL   MCH 29.2 26.0  - 34.0 pg   MCHC 32.8 30.0 - 36.0 g/dL   RDW 12.8 11.5 - 15.5 %   Platelets 292 150 - 400 K/uL   nRBC 0.0 0.0 - 0.2 %   Neutrophils Relative % 88 %   Neutro Abs 15.6 (H) 1.7 - 7.7 K/uL   Lymphocytes Relative 9 %   Lymphs Abs 1.6 0.7 - 4.0 K/uL   Monocytes Relative 2 %   Monocytes Absolute 0.4 0.1 - 1.0 K/uL   Eosinophils Relative 1 %   Eosinophils Absolute 0.1 0.0 - 0.5 K/uL   Basophils Relative 0 %   Basophils Absolute 0.1 0.0 - 0.1 K/uL   Immature Granulocytes 0 %   Abs Immature Granulocytes 0.06 0.00 - 0.07 K/uL    Comment: Performed at [facility], [address], [place], [place] [address]   Dg Chest 2 View  Result Date: 08/14/2018 CLINICAL DATA:  85 year old female with mid chest pain. EXAM: CHEST - 2 VIEW COMPARISON:  CTA chest 07/05/2018 and earlier. FINDINGS: Moderate scoliosis. Stable mild cardiomegaly and tortuosity of the thoracic aorta. Other mediastinal contours are within normal limits. Visualized tracheal air column is within normal limits. No pneumothorax, pulmonary edema, pleural effusion or acute pulmonary opacity. Negative visible bowel gas pattern. IMPRESSION: Stable.  No acute cardiopulmonary abnormality. Electronically Signed   By: Genevie Ann M.D.   On: 08/14/2018 21:17   Ct Abdomen Pelvis W Contrast  Result Date: 08/15/2018 CLINICAL DATA:  Upper abdominal pain and nausea. History of gallstones and atrial fibrillation. EXAM: CT ABDOMEN AND PELVIS WITH CONTRAST TECHNIQUE: Multidetector CT imaging of the abdomen and pelvis was performed using the standard protocol following bolus administration of intravenous contrast. CONTRAST:  67m OMNIPAQUE IOHEXOL 300 MG/ML  SOLN COMPARISON:  MRI abdomen 07/06/2018. CT abdomen and pelvis 09/16/2017 FINDINGS: Lower chest: Small left pleural effusion. Atelectasis in both lung bases. Cardiac enlargement. Small esophageal hiatal hernia. Residual contrast material in the lower esophagus may represent reflux or dysmotility.  Hepatobiliary: There is prominent intra and extrahepatic bile duct dilatation, increasing since the previous studies. Small stones are demonstrated in the gallbladder. Gallbladder wall is thickened. Gallbladder is mildly distended. Given these findings, this raises suspicion for an occult common bile duct stone although no radiopaque stone is demonstrated. Pancreas: Unremarkable. No pancreatic ductal dilatation or surrounding inflammatory changes. Spleen: Normal in size without focal abnormality. Adrenals/Urinary Tract: Adrenal glands are unremarkable. Kidneys are normal, without renal calculi, focal lesion, or hydronephrosis. Bladder is unremarkable. Stomach/Bowel: Stomach, small bowel, and colon are not abnormally distended. Prominence of gastric rugal folds is unchanged since prior study and could represent gastritis. Scattered stool throughout the colon. Static colonic diverticula. No wall thickening or inflammatory changes. No evidence of diverticulitis. Vascular/Lymphatic: Aortic atherosclerosis. No enlarged abdominal or pelvic lymph nodes. Reproductive: Uterus and bilateral adnexa are unremarkable. Other: No free air or free fluid in the abdomen. Abdominal wall musculature appears intact. Musculoskeletal: Lumbar scoliosis convex towards the left. Degenerative changes in the lumbar spine. Old fracture deformities of the right superior and inferior pubic rami. Old compression deformity of L1 vertebra. IMPRESSION: 1. Intra and extrahepatic bile duct dilatation with gallbladder wall thickening and small stones. No radiopaque common bile duct stones are identified although changes are worrisome for an occult obstructing common bile duct stone. Consider ultrasound follow-up. 2. Small esophageal hiatal hernia with residual contrast material in the lower  esophagus suggesting reflux or dysmotility. 3. Small left pleural effusion with basilar atelectasis. 4. Prominence of gastric rugal folds is unchanged since prior  study and could indicate chronic gastritis. 5. Diverticulosis of the colon without evidence of diverticulitis. 6. Old fracture deformities of the right pubic rami and L1 vertebra. Lumbar scoliosis and degenerative changes. Aortic Atherosclerosis (ICD10-I70.0). Electronically Signed   By: Lucienne Capers M.D.   On: 08/15/2018 00:03    Review of Systems  Constitutional: Negative for chills and fever.  HENT: Negative for sore throat and tinnitus.   Eyes: Negative for blurred vision and redness.  Respiratory: Negative for cough and shortness of breath.   Cardiovascular: Positive for chest pain. Negative for palpitations, orthopnea and PND.  Gastrointestinal: Positive for abdominal pain and nausea. Negative for diarrhea.  Genitourinary: Negative for dysuria, frequency and urgency.  Musculoskeletal: Negative for joint pain and myalgias.  Skin: Negative for rash.       No lesions  Neurological: Negative for speech change, focal weakness and weakness.  Endo/Heme/Allergies: Does not bruise/bleed easily.       No temperature intolerance  Psychiatric/Behavioral: Negative for depression and suicidal ideas.    Blood pressure (!) 129/96, pulse (!) 142, resp. rate (!) 32, height 5' 1"  (1.549 m), weight 47.6 kg, SpO2 91 %. Physical Exam  Vitals reviewed. Constitutional: She is oriented to person, place, and time. She appears well-developed and well-nourished. No distress.  HENT:  Head: Normocephalic and atraumatic.  Mouth/Throat: Oropharynx is clear and moist.  Eyes: Pupils are equal, round, and reactive to light. Conjunctivae and EOM are normal. No scleral icterus.  Neck: Normal range of motion. Neck supple. No JVD present. No tracheal deviation present. No thyromegaly present.  Cardiovascular: Normal rate, regular rhythm and normal heart sounds. Exam reveals no gallop and no friction rub.  No murmur heard. Respiratory: Effort normal and breath sounds normal.  GI: Soft. Bowel sounds are normal. She  exhibits no distension. There is no abdominal tenderness.  Genitourinary:    Genitourinary Comments: Deferred   Musculoskeletal: Normal range of motion.        General: No edema.  Lymphadenopathy:    She has no cervical adenopathy.  Neurological: She is alert and oriented to person, place, and time. No cranial nerve deficit. She exhibits normal muscle tone.  Skin: Skin is warm and dry. No rash noted. No erythema.  Psychiatric: She has a normal mood and affect. Her behavior is normal. Judgment and thought content normal.     Assessment/Plan This is a 85 year old female admitted for upper quadrant pain. 1.  Right upper quadrant pain: Radiating to chest.  No signs or symptoms of acute coronary syndrome.  The patient is status post cholecystectomy but still may have an intrahepatic biliary stone.  Consult gastroenterology for further guidance. 2.  Transaminitis: AST nearly 3 times greater than ALT with elevated alk phos and mildly elevated bilirubin.  Concern for obstructive stone but none visualized on abdominal MRI.  Emergency department has consulted the surgical service as well. 3.  Sepsis: The patient meets criteria via cardia and tachypnea.  Her blood pressure is high.  She is hemodynamically stable.  Continue Zosyn. 4.  Atrial fibrillation: With rapid ventricular rate; the patient is received diltiazem in the emergency department which controlled her rate briefly.  Continue carvedilol and flecainide.  After volume resuscitation consider antiarrhythmic drip to control heart rate.  Continue Eliquis 5.  DVT prophylaxis: As above 6.  GI prophylaxis: Pantoprazole per home regimen The  patient is a full code.  Time spent on admission orders and patient care approximately 45 minutes  Addendum: Notified that MRCP shows common bile duct stone that will require immediate surgical intervention.  Will need to transfer.   Harrie Foreman, MD 08/15/2018, 1:37 AM

## 2018-08-15 NOTE — Discharge Summary (Signed)
[facility] NAME: Kylie May    MR#:  [id number]  DATE OF BIRTH:  04-20-1922  DATE OF ADMISSION:  08/14/2018 ADMITTING PHYSICIAN: Harrie Foreman, MD  DATE OF DISCHARGE: 08/15/2018  PRIMARY CARE PHYSICIAN: [name], MD    ADMISSION DIAGNOSIS:  Cholecystitis [K81.9] Pain [R52]  DISCHARGE DIAGNOSIS:  Active Problems:   RUQ pain   Transaminitis   SECONDARY DIAGNOSIS:   Past Medical History:  Diagnosis Date  . A-fib ([place])   . Acid reflux disease   . Arrhythmia   . Chronic diastolic CHF (congestive heart failure) ([place])   . Compression fracture of lumbar vertebra ([place])   . Diverticulitis 01-2008   [facility]  . Diverticulosis   . Esophageal stricture 2006  . Femoral bruit    Right  . Fibrocystic breast disease   . Hiatal hernia   . Hypercholesterolemia    [place] study LDL goal = < 160  . Mitral regurgitation    a. 06/2014 EF 55-60%, elevated end-diastolic pressures, dilated LA at 4.3 cm, mildly dilated RA, severe mitral regurgitation, mild aortic sclerosis without stenosis, mod-severe TR  . Mitral valve prolapse   . Osteoporosis    Dr Matthew Saras  . Pancreatitis 07-2008   Hospitalized   . Schatzki's ring   . Skin cancer    facial x 2. [name]    HOSPITAL COURSE:  85 year old female with history of PAF on anticoagulation, chronic diastolic heart hyperlipidemia who presents to the emergency room due to abdominal pain.   1.  Abdominal pain: This is due to 7 mm CBD stone and ductal dilation.  Recommendations by GI are to transfer patient for ERCP as we are unable to provide the services over the weekend and next week. Patient should continue Rocephin and Flagyl antibiotics. Continue to hold Eliquis. Patient and family aware that procedure will not happen until Monday due to Eliquis. She will be transferred to [facility] for further evaluation and treatment. 2.  Elevated LFTs due to above  issue  3.  Sepsis with tachycardia and tachypnea due to CBD stone  4.  PAF: Patient's heart rate is controlled.  She will continue Coreg and flecainide.  Eliquis on hold for now due to planned surgery.  DISCHARGE CONDITIONS AND DIET:   Stable for transfer n.p.o.  CONSULTS OBTAINED:  Treatment Team:  Benjamine Sprague, DO [name], MD  DRUG ALLERGIES:   Allergies  Allergen Reactions  . Metronidazole     Other reaction(s): Dizziness and giddiness (finding), Other (qualifier value) made things feel like they were vibrating like the bed and floor,also caused mild dizziness weakness  . Penicillin G Rash    DISCHARGE MEDICATIONS:   Allergies as of 08/15/2018      Reactions   Metronidazole    Other reaction(s): Dizziness and giddiness (finding), Other (qualifier value) made things feel like they were vibrating like the bed and floor,also caused mild dizziness weakness   Penicillin G Rash      Medication List    STOP taking these medications   apixaban 2.5 MG Tabs tablet Commonly known as:  Eliquis     TAKE these medications   carvedilol 3.125 MG tablet Commonly known as:  COREG Take 1 tablet (3.125 mg total) by mouth 2 (two) times daily with a meal.   flecainide 50 MG tablet Commonly known as:  TAMBOCOR Take 1 tablet (50 mg total) by mouth 2 (two) times daily.  magnesium chloride 64 MG Tbec SR tablet Commonly known as:  SLOW-MAG Take 2 tablets (128 mg total) by mouth daily.   omeprazole 40 MG capsule Commonly known as:  PRILOSEC Take 1 capsule (40 mg total) by mouth daily.   rosuvastatin 20 MG tablet Commonly known as:  CRESTOR Take 1 tablet (20 mg total) by mouth daily.   triamcinolone ointment 0.1 % Commonly known as:  KENALOG         Today   CHIEF COMPLAINT:   No acute issues overnight.  Family is at bedside.   VITAL SIGNS:  Blood pressure (!) 181/105, pulse (!) 116, temperature 97.6 F (36.4 C), temperature source Oral, resp. rate  20, height 4\' 9"  (1.448 m), weight 48 kg, SpO2 100 %.   REVIEW OF SYSTEMS:  Review of Systems  Constitutional: Positive for fever. Negative for chills and malaise/fatigue.  HENT: Negative.  Negative for ear discharge, ear pain, hearing loss, nosebleeds and sore throat.   Eyes: Negative.  Negative for blurred vision and pain.  Respiratory: Negative.  Negative for cough, hemoptysis, shortness of breath and wheezing.   Cardiovascular: Negative.  Negative for chest pain, palpitations and leg swelling.  Gastrointestinal: Positive for abdominal pain and nausea. Negative for blood in stool, diarrhea and vomiting.  Genitourinary: Negative.  Negative for dysuria.  Musculoskeletal: Negative.  Negative for back pain.  Skin: Negative.   Neurological: Negative for dizziness, tremors, speech change, focal weakness, seizures and headaches.  Endo/Heme/Allergies: Negative.  Does not bruise/bleed easily.  Psychiatric/Behavioral: Negative.  Negative for depression, hallucinations and suicidal ideas.     PHYSICAL EXAMINATION:  GENERAL:  85 y.o.-year-old patient lying in the bed with no acute distress.  NECK:  Supple, no jugular venous distention. No thyroid enlargement, no tenderness.  LUNGS: Normal breath sounds bilaterally, no wheezing, rales,rhonchi  No use of accessory muscles of respiration.  CARDIOVASCULAR: S1, S2 normal. No murmurs, rubs, or gallops.  ABDOMEN: Soft, non-tender, non-distended. Bowel sounds present. No organomegaly or mass.  EXTREMITIES: No pedal edema, cyanosis, or clubbing.  PSYCHIATRIC: The patient is alert and oriented x 3.  SKIN: No obvious rash, lesion, or ulcer.   DATA REVIEW:   CBC Recent Labs  Lab 08/14/18 2055  WBC 17.9*  18.1*  HGB 11.0*  11.0*  HCT 33.5*  33.6*  PLT 292  284    Chemistries  Recent Labs  Lab 08/14/18 2055  NA 134*  K 3.6  CL 103  CO2 22  GLUCOSE 170*  BUN 13  CREATININE 0.84  CALCIUM 8.7*  AST 407*  ALT 150*  ALKPHOS 153*   BILITOT 1.5*    Cardiac Enzymes Recent Labs  Lab 08/14/18 2055  TROPONINI <0.03    Microbiology Results  @[email or web address] @  RADIOLOGY:  Dg Chest 2 View  Result Date: 08/14/2018 CLINICAL DATA:  85 year old female with mid chest pain. EXAM: CHEST - 2 VIEW COMPARISON:  CTA chest 07/05/2018 and earlier. FINDINGS: Moderate scoliosis. Stable mild cardiomegaly and tortuosity of the thoracic aorta. Other mediastinal contours are within normal limits. Visualized tracheal air column is within normal limits. No pneumothorax, pulmonary edema, pleural effusion or acute pulmonary opacity. Negative visible bowel gas pattern. IMPRESSION: Stable.  No acute cardiopulmonary abnormality. Electronically Signed   By: Genevie Ann M.D.   On: 08/14/2018 21:17   Ct Abdomen Pelvis W Contrast  Result Date: 08/15/2018 CLINICAL DATA:  Upper abdominal pain and nausea. History of gallstones and atrial fibrillation. EXAM: CT ABDOMEN AND PELVIS WITH CONTRAST TECHNIQUE: Multidetector CT  imaging of the abdomen and pelvis was performed using the standard protocol following bolus administration of intravenous contrast. CONTRAST:  31mL OMNIPAQUE IOHEXOL 300 MG/ML  SOLN COMPARISON:  MRI abdomen 07/06/2018. CT abdomen and pelvis 09/16/2017 FINDINGS: Lower chest: Small left pleural effusion. Atelectasis in both lung bases. Cardiac enlargement. Small esophageal hiatal hernia. Residual contrast material in the lower esophagus may represent reflux or dysmotility. Hepatobiliary: There is prominent intra and extrahepatic bile duct dilatation, increasing since the previous studies. Small stones are demonstrated in the gallbladder. Gallbladder wall is thickened. Gallbladder is mildly distended. Given these findings, this raises suspicion for an occult common bile duct stone although no radiopaque stone is demonstrated. Pancreas: Unremarkable. No pancreatic ductal dilatation or surrounding inflammatory changes. Spleen: Normal in size without focal  abnormality. Adrenals/Urinary Tract: Adrenal glands are unremarkable. Kidneys are normal, without renal calculi, focal lesion, or hydronephrosis. Bladder is unremarkable. Stomach/Bowel: Stomach, small bowel, and colon are not abnormally distended. Prominence of gastric rugal folds is unchanged since prior study and could represent gastritis. Scattered stool throughout the colon. Static colonic diverticula. No wall thickening or inflammatory changes. No evidence of diverticulitis. Vascular/Lymphatic: Aortic atherosclerosis. No enlarged abdominal or pelvic lymph nodes. Reproductive: Uterus and bilateral adnexa are unremarkable. Other: No free air or free fluid in the abdomen. Abdominal wall musculature appears intact. Musculoskeletal: Lumbar scoliosis convex towards the left. Degenerative changes in the lumbar spine. Old fracture deformities of the right superior and inferior pubic rami. Old compression deformity of L1 vertebra. IMPRESSION: 1. Intra and extrahepatic bile duct dilatation with gallbladder wall thickening and small stones. No radiopaque common bile duct stones are identified although changes are worrisome for an occult obstructing common bile duct stone. Consider ultrasound follow-up. 2. Small esophageal hiatal hernia with residual contrast material in the lower esophagus suggesting reflux or dysmotility. 3. Small left pleural effusion with basilar atelectasis. 4. Prominence of gastric rugal folds is unchanged since prior study and could indicate chronic gastritis. 5. Diverticulosis of the colon without evidence of diverticulitis. 6. Old fracture deformities of the right pubic rami and L1 vertebra. Lumbar scoliosis and degenerative changes. Aortic Atherosclerosis (ICD10-I70.0). Electronically Signed   By: Lucienne Capers M.D.   On: 08/15/2018 00:03      Allergies as of 08/15/2018      Reactions   Metronidazole    Other reaction(s): Dizziness and giddiness (finding), Other (qualifier value) made  things feel like they were vibrating like the bed and floor,also caused mild dizziness weakness   Penicillin G Rash      Medication List    STOP taking these medications   apixaban 2.5 MG Tabs tablet Commonly known as:  Eliquis     TAKE these medications   carvedilol 3.125 MG tablet Commonly known as:  COREG Take 1 tablet (3.125 mg total) by mouth 2 (two) times daily with a meal.   flecainide 50 MG tablet Commonly known as:  TAMBOCOR Take 1 tablet (50 mg total) by mouth 2 (two) times daily.   magnesium chloride 64 MG Tbec SR tablet Commonly known as:  SLOW-MAG Take 2 tablets (128 mg total) by mouth daily.   omeprazole 40 MG capsule Commonly known as:  PRILOSEC Take 1 capsule (40 mg total) by mouth daily.   rosuvastatin 20 MG tablet Commonly known as:  CRESTOR Take 1 tablet (20 mg total) by mouth daily.   triamcinolone ointment 0.1 % Commonly known as:  KENALOG          Management plans discussed with  the patient and family and they are in agreement. Stable for discharge [place]  Patient should follow up with dr Rutherford Nail  CODE STATUS:     Code Status Orders  (From admission, onward)         Start     Ordered   08/15/18 0353  Full code  Continuous     08/15/18 0352        Code Status History    Date Active Date Inactive Code Status Order ID Comments User Context   07/05/2018 2310 07/07/2018 1500 Full Code [id number]  Arta Silence, MD Inpatient    Advance Directive Documentation     Most Recent Value  Type of Advance Directive  Healthcare Power of Attorney  Pre-existing out of facility DNR order (yellow form or pink MOST form)  -  "MOST" Form in Place?  -      TOTAL TIME TAKING CARE OF THIS PATIENT: 39 minutes.    Note: This dictation was prepared with Dragon dictation along with smaller phrase technology. Any transcriptional errors that result from this process are unintentional.  Ione Sandusky M.D on 08/15/2018 at 11:31 AM  Between 7am to  6pm - Pager - [phone] After 6pm go to [email or web address] - password [email or web address] [facility]  Office  [phone]  CC: Primary care physician; [name], MD

## 2018-08-15 NOTE — Consult Note (Signed)
Subjective:   CC: elevated LFTs  HPI:  Kylie May is a 85 y.o. female who is consulted by [facility] for evaluation of above cc.  Symptoms were first noted a few hours ago. Pain is sharp, localized to epigastric area, radiating toward back  Associated with nothing specific, exacerbated by nothing specfic.     Past Medical History:  has a past medical history of A-fib ([place]), Acid reflux disease, Arrhythmia, Chronic diastolic CHF (congestive heart failure) ([place]), Compression fracture of lumbar vertebra ([place]), Diverticulitis (01-2008), Diverticulosis, Esophageal stricture (2006), Femoral bruit, Fibrocystic breast disease, Hiatal hernia, Hypercholesterolemia, Mitral regurgitation, Mitral valve prolapse, Osteoporosis, Pancreatitis (07-2008), Schatzki's ring, and Skin cancer.  Past Surgical History:  has a past surgical history that includes G3 P4 (twins); Esophageal dilation (2006); Epidural steroids; Skin cancer excision; and Colonoscopy (2006).  Family History: family history includes Breast cancer in her maternal aunt; Heart attack (age of onset: 14) in her daughter; Heart attack (age of onset: 1) in her sister; Hepatitis in her daughter; Lung disease in her mother.  Social History:  reports that she quit smoking about 67 years ago. She has never used smokeless tobacco. She reports current alcohol use of about 7.0 standard drinks of alcohol per week. She reports that she does not use drugs.  Current Medications:  rosuvastatin (CRESTOR) 20 MG tablet Take 1 tablet (20 mg total) by mouth daily. Minna Merritts, MD Needs Review  triamcinolone ointment (KENALOG) 0.1 %  [provider] Needs Review  Reviewed Prior to Admission Medications   Medication Details Provider Last Reconciliation Status  apixaban (ELIQUIS) 2.5 MG TABS tablet Take 1 tablet (2.5 mg total) by mouth 2 (two) times daily. Nena Polio, MD Reordered  Ordered as: apixaban [facility]) tablet 2.5 mg - 2.5 mg, Oral, 2  times daily, First dose on Sat 08/15/18 at 0115  carvedilol (COREG) 3.125 MG tablet Take 1 tablet (3.125 mg total) by mouth 2 (two) times daily with a meal. Nena Polio, MD Reordered  Ordered as: carvedilol (COREG) tablet 3.125 mg - 3.125 mg, Oral, 2 times daily with meals, First dose on Sat 08/15/18 at 0800  flecainide (TAMBOCOR) 50 MG tablet Take 1 tablet (50 mg total) by mouth 2 (two) times daily. Nena Polio, MD Reordered  Ordered as: flecainide [facility]) tablet 50 mg - 50 mg, Oral, 2 times daily, First dose on Sat 08/15/18 at 0115  magnesium chloride (SLOW-MAG) 64 MG TBEC SR tablet Take 2 tablets (128 mg total) by mouth daily. Nena Polio, MD Reordered  Ordered as: magnesium chloride (SLOW-MAG) 64 MG SR tablet 128 mg - 128 mg (2 tablet), Oral, Daily, First dose on Sat 08/15/18 at 1000  omeprazole (PRILOSEC) 40 MG capsule Take 1 capsule (40 mg total) by mouth daily. Nena Polio, MD Reordered  Ordered as: pantoprazole (PROTONIX) EC tablet 40 mg - 40 mg, Oral, Daily, First dose on Sat 08/15/18 at 1000     Allergies:  Allergies as of 08/14/2018 - Review Complete 08/14/2018  Allergen Reaction Noted  . Metronidazole  11/11/2006  . Penicillin g Rash 08/11/2015    ROS:  Unable to obtain full ROS due to pt difficulty with hearing.  Pertinent positives and negatives noted in HPI.   Objective:     BP (!) 129/96 (BP Location: Right Arm)   Pulse (!) 142   Resp (!) 32   Ht 5\' 1"  (1.549 m)   Wt 47.6 kg   SpO2 91%   BMI 19.84  kg/m    Constitutional :  alert, cooperative, appears stated age and no distress  Lymphatics/Throat:  no asymmetry, masses, or scars  Respiratory:  clear to auscultation bilaterally  Cardiovascular:  irregularly irregular rhythm  Gastrointestinal: soft, no guarding, focal tenderness to palpation in epigastric, RUQ region, some distention.   Musculoskeletal: Steady gait and movement  Skin: Cool and moist  Psychiatric: Normal affect, non-agitated, not  confused       LABS:  CMP Latest Ref Rng & Units 08/14/2018 07/13/2018 07/07/2018  Glucose 70 - 99 mg/dL 170(H) 84 91  BUN 8 - 23 mg/dL 13 15 15   Creatinine 0.44 - 1.00 mg/dL 0.84 0.90 1.14(H)  Sodium 135 - 145 mmol/L 134(L) 136 136  Potassium 3.5 - 5.1 mmol/L 3.6 4.8 4.0  Chloride 98 - 111 mmol/L 103 103 107  CO2 22 - 32 mmol/L 22 26 24   Calcium 8.9 - 10.3 mg/dL 8.7(L) 9.5 8.5(L)  Total Protein 6.5 - 8.1 g/dL 6.7 6.8 6.1(L)  Total Bilirubin 0.3 - 1.2 mg/dL 1.5(H) 0.5 0.8  Alkaline Phos 38 - 126 U/L 153(H) 55 54  AST 15 - 41 U/L 407(H) 22 177(H)  ALT 0 - 44 U/L 150(H) 34 196(H)   CBC Latest Ref Rng & Units 08/14/2018 08/14/2018 07/13/2018  WBC 4.0 - 10.5 K/uL 17.9(H) 18.1(H) 4.8  Hemoglobin 12.0 - 15.0 g/dL 11.0(L) 11.0(L) 12.2  Hematocrit 36.0 - 46.0 % 33.5(L) 33.6(L) 36.8  Platelets 150 - 400 K/uL 292 284 255.0     RADS: CLINICAL DATA:  Upper abdominal pain and nausea. History of gallstones and atrial fibrillation.  EXAM: CT ABDOMEN AND PELVIS WITH CONTRAST  TECHNIQUE: Multidetector CT imaging of the abdomen and pelvis was performed using the standard protocol following bolus administration of intravenous contrast.  CONTRAST:  61mL OMNIPAQUE IOHEXOL 300 MG/ML  SOLN  COMPARISON:  MRI abdomen 07/06/2018. CT abdomen and pelvis 09/16/2017  FINDINGS: Lower chest: Small left pleural effusion. Atelectasis in both lung bases. Cardiac enlargement. Small esophageal hiatal hernia. Residual contrast material in the lower esophagus may represent reflux or dysmotility.  Hepatobiliary: There is prominent intra and extrahepatic bile duct dilatation, increasing since the previous studies. Small stones are demonstrated in the gallbladder. Gallbladder wall is thickened. Gallbladder is mildly distended. Given these findings, this raises suspicion for an occult common bile duct stone although no radiopaque stone is demonstrated.  Pancreas: Unremarkable. No pancreatic ductal  dilatation or surrounding inflammatory changes.  Spleen: Normal in size without focal abnormality.  Adrenals/Urinary Tract: Adrenal glands are unremarkable. Kidneys are normal, without renal calculi, focal lesion, or hydronephrosis. Bladder is unremarkable.  Stomach/Bowel: Stomach, small bowel, and colon are not abnormally distended. Prominence of gastric rugal folds is unchanged since prior study and could represent gastritis. Scattered stool throughout the colon. Static colonic diverticula. No wall thickening or inflammatory changes. No evidence of diverticulitis.  Vascular/Lymphatic: Aortic atherosclerosis. No enlarged abdominal or pelvic lymph nodes.  Reproductive: Uterus and bilateral adnexa are unremarkable.  Other: No free air or free fluid in the abdomen. Abdominal wall musculature appears intact.  Musculoskeletal: Lumbar scoliosis convex towards the left. Degenerative changes in the lumbar spine. Old fracture deformities of the right superior and inferior pubic rami. Old compression deformity of L1 vertebra.  IMPRESSION: 1. Intra and extrahepatic bile duct dilatation with gallbladder wall thickening and small stones. No radiopaque common bile duct stones are identified although changes are worrisome for an occult obstructing common bile duct stone. Consider ultrasound follow-up. 2. Small esophageal hiatal hernia with  residual contrast material in the lower esophagus suggesting reflux or dysmotility. 3. Small left pleural effusion with basilar atelectasis. 4. Prominence of gastric rugal folds is unchanged since prior study and could indicate chronic gastritis. 5. Diverticulosis of the colon without evidence of diverticulitis. 6. Old fracture deformities of the right pubic rami and L1 vertebra. Lumbar scoliosis and degenerative changes.  Aortic Atherosclerosis (ICD10-I70.0).   Electronically Signed   By: Lucienne Capers M.D.   On: 08/15/2018  00:03 Assessment:      Epigastric pain with cholelithiasis, elevated LFTs, and increased distention of biliary ducts, concern for possible choledocholithiasis  History of A. fib on Eliquis, now in A. fib with RVR.  Plan:     Patient needs further work-up for duct dilation and elevated LFTs concerning for possible choledocholithiasis.  Discussed case with Dr. Marius Ditch, GI consult, and she recommended a stat MRCP.  Of note, Dr. Allen Norris, who performs ERCP is not available over the weekend.  If MRCP is positive patient likely will need to be transferred to hospital with ERCP capabilities.  If MRCP is negative, we can continue to monitor and trend the labs here.  Once the choledocholithiasis work-up is completed, we can consider cholecystectomy at that time.  I did have a brief discussion with the patient and family members that she is still at a considerably high risk for any surgical intervention secondary to her advanced age as well as her A. fib and Eliquis use.  Patient currently is in A. fib with RVR.  Diltiazem started per ED. further management per primary team.  Persistent A. fib with RVR will make it unlikely that we will proceed with a cholecystectomy.  Will likely need to consider alternatives such as percutaneous drainage.  We will continue to monitor closely.

## 2018-08-15 NOTE — Progress Notes (Signed)
08/15/2018 6:08 PM  Patient transferred to [facility] via [facility].  Family collected patient's belongings and took with them.  Dola Argyle, RN

## 2018-08-15 NOTE — Progress Notes (Signed)
Patient is a 85 yo female currently at [facility].  She presented with c/o chest pain but upon further evaluation was found to have CT with intrahepatic ductal dilatation and gallbladder wall thickening without stones (despite h/o cholecystectomy).  She also was noted to have afib with RVR.  MRCP was done - results indicated need for ERCP based on dilated CBD 14 mm with suspected 7 mm distal CBD stone.  She is strict NPO, they are holding Eliquis, and she is on antibiotic coverage with Rocephin and Flagyl.  [place] does not have ERCP capability at this time and so the decision was made to transfer the patient.  She does not appear to be on a Cardizem drip and takes Flecainide.  Will accept patient Med Tele assuming our GI group will consult and can perform the needed procedure.  Carlyon Shadow, M.D.

## 2018-08-15 NOTE — Progress Notes (Signed)
CRITICAL VALUE ALERT  Critical Value:  Mag 1.1  Date & Time Notied:  08/15/2018 @ 2255  Provider Notified: Dr. Silas [place]   Orders Received/Actions taken: RN waiting for new orders.

## 2018-08-15 NOTE — Consult Note (Addendum)
Kylie Darby, MD [address]  [place], [place] [address]  Main: [phone]  Fax: [phone] Pager: [phone]   Consultation  Referring Provider:     No ref. provider found Primary Care Physician:  [name], MD Primary Gastroenterologist:  none      Reason for Consultation:   Choledocholithiasis  Date of Admission:  08/14/2018 Date of Consultation:  08/15/2018         HPI:   Kylie May is a 85 y.o. female with history of A. fib on Eliquis, congestive heart failure currently euvolemic, known history of cholelithiasis as well as choledocholithiasis status post ERCP about 10 years ago with stent placement and stone extraction.  She did not undergo cholecystectomy during her age and medical condition at that time.  Patient was recently at [facility] on 07/06/2018 secondary to elevated LFTs and also found to have mild intrahepatic and extrahepatic biliary dilation with no evidence of choledocholithiasis on CT and MRCP, prominent CBD.  She was empirically started on antibiotics, LFTs normalized and ERCP was deferred at that time due to her age and medical condition and she may have passed the stone.  Her LFTs were completely normal after discharge. She now presents again with severe epigastric pain radiating to the back associated with nausea, elevated LFTs, AP 153, AST 407, ALT 150, total bilirubin 1.5, lipase 107, initial CT abdomen and pelvis in the ER revealed intra-and extrahepatic bile duct dilation with gallbladder wall thickening, cholelithiasis and possible stone in the common bile duct.  Subsequently underwent MRCP which confirmed presence of choledocholithiasis, distal CBD stone 7 mm in size, CBD measuring 14 mm, mild intrahepatic ductal dilation.  Due to nonavailability of ERCP over the weekend, last night after reviewing the MRCP results, given patient's clinical condition, her age and leukocytosis 18.1, low-grade fever I recommended transfer to  [facility].  Patient is awaiting to be transferred.  Patient's last dose of Eliquis was on 08/31/2018 at 9 AM. She had low-grade fever 100.2 today, I repeated LFTs which revealed increase in T bili 3.8 from 1.5, direct bilirubin 2.8, AST 242, ALT 182, AP 138.  She has persistent leukocytosis as well.  She has been hemodynamically stable.  She received first dose of Zosyn in ER last night.  Later switched to ceftriaxone on the floor which she received 1 dose today.  I switch her back to Zosyn today.  She has some intolerance to metronidazole in the past.  Patient is not complaining of pain, has been comfortable, denies nausea or vomiting, would like to have something to eat or drink.   NSAIDs: None  Antiplts/Anticoagulants/Anti thrombotics: Eliquis for A. fib  GI Procedures: ERCP 10 years ago for choledocholithiasis with stent placement and stone extraction  Past Medical History:  Diagnosis Date   A-fib (HCC)    Acid reflux disease    Arrhythmia    Chronic diastolic CHF (congestive heart failure) (HCC)    Compression fracture of lumbar vertebra (HCC)    Diverticulitis 01-2008   GI [place]   Diverticulosis    Esophageal stricture 2006   Femoral bruit    Right   Fibrocystic breast disease    Hiatal hernia    Hypercholesterolemia    [place] study LDL goal = < 160   Mitral regurgitation    a. 06/2014 EF 55-60%, elevated end-diastolic pressures, dilated LA at 4.3 cm, mildly dilated RA, severe mitral regurgitation, mild aortic sclerosis without stenosis, mod-severe TR  Mitral valve prolapse    Osteoporosis    Dr Matthew Saras   Pancreatitis 07-2008   Hospitalized    Schatzki's ring    Skin cancer    facial x 2. [name]    Past Surgical History:  Procedure Laterality Date   COLONOSCOPY  2006   Epidural steroids     x1   ESOPHAGEAL DILATION  2006   G3 P4 (twins)     SKIN CANCER EXCISION      Prior to Admission medications   Medication Sig Start Date End  Date Taking? Authorizing Provider  apixaban (ELIQUIS) 2.5 MG TABS tablet Take 1 tablet (2.5 mg total) by mouth 2 (two) times daily. 08/04/18  Yes Minna Merritts, MD  carvedilol (COREG) 3.125 MG tablet Take 1 tablet (3.125 mg total) by mouth 2 (two) times daily with a meal. 08/04/18  Yes Gollan, Kathlene November, MD  flecainide (TAMBOCOR) 50 MG tablet Take 1 tablet (50 mg total) by mouth 2 (two) times daily. 08/04/18  Yes Gollan, Kathlene November, MD  magnesium chloride (SLOW-MAG) 64 MG TBEC SR tablet Take 2 tablets (128 mg total) by mouth daily. 12/18/17  Yes [name], MD  omeprazole (PRILOSEC) 40 MG capsule Take 1 capsule (40 mg total) by mouth daily. 05/13/18 08/15/18 Yes [name], MD  rosuvastatin (CRESTOR) 20 MG tablet Take 1 tablet (20 mg total) by mouth daily. 08/04/18  Yes Minna Merritts, MD  triamcinolone ointment (KENALOG) 0.1 %  07/29/18  Yes [provider]    Current Facility-Administered Medications:    0.9 %  sodium chloride infusion, , Intravenous, Continuous, Harrie Foreman, MD, Last Rate: 75 mL/hr at 08/15/18 0750   acetaminophen (TYLENOL) tablet 650 mg, 650 mg, Oral, Q6H PRN, 650 mg at 08/15/18 1237 **OR** acetaminophen (TYLENOL) suppository 650 mg, 650 mg, Rectal, Q6H PRN, Harrie Foreman, MD   carvedilol (COREG) tablet 3.125 mg, 3.125 mg, Oral, BID WC, Harrie Foreman, MD, 3.125 mg at 08/15/18 1039   docusate sodium (COLACE) capsule 100 mg, 100 mg, Oral, BID, Harrie Foreman, MD, 100 mg at 08/15/18 1054   flecainide (TAMBOCOR) tablet 50 mg, 50 mg, Oral, BID, Harrie Foreman, MD, 50 mg at 08/15/18 1238   magnesium chloride (SLOW-MAG) 64 MG SR tablet 128 mg, 2 tablet, Oral, Daily, Harrie Foreman, MD, 128 mg at 08/15/18 1238   morphine 2 MG/ML injection 1 mg, 1 mg, Intravenous, Q3H PRN, Harrie Foreman, MD, 1 mg at 08/15/18 4818   morphine 2 MG/ML injection 2 mg, 2 mg, Intravenous, Q4H PRN, Harrie Foreman, MD, 2 mg at 08/15/18 0426    ondansetron (ZOFRAN) tablet 4 mg, 4 mg, Oral, Q6H PRN **OR** ondansetron (ZOFRAN) injection 4 mg, 4 mg, Intravenous, Q6H PRN, Harrie Foreman, MD, 4 mg at 08/15/18 0648   pantoprazole (PROTONIX) EC tablet 40 mg, 40 mg, Oral, Daily, Harrie Foreman, MD, 40 mg at 08/15/18 1039   piperacillin-tazobactam (ZOSYN) IVPB 3.375 g, 3.375 g, Intravenous, Q6H, Annalie Wenner, Tally Due, MD, Last Rate: 100 mL/hr at 08/15/18 1625, 3.375 g at 08/15/18 1625  Family History  Problem Relation Age of Onset   Breast cancer Maternal Aunt    Hepatitis Daughter        C   Lung disease Mother        lung tumor   Heart attack Daughter 36       S/P stents   Heart attack Sister 55   Diabetes Neg Hx  Stroke Neg Hx      Social History   Tobacco Use   Smoking status: Former Smoker    Last attempt to quit: 06/04/1951    Years since quitting: 67.2   Smokeless tobacco: Never Used   Tobacco comment: Smoked 1945-1953 , up to 2 cigarettes/day  Substance Use Topics   Alcohol use: Yes    Alcohol/week: 7.0 standard drinks    Types: 7 Glasses of wine per week   Drug use: No    Allergies as of 08/14/2018 - Review Complete 08/14/2018  Allergen Reaction Noted   Metronidazole  11/11/2006   Penicillin g Rash 08/11/2015    Review of Systems:    All systems reviewed and negative except where noted in HPI.   Physical Exam:  Vital signs in last 24 hours: Temp:  [97.6 F (36.4 C)-100.2 F (37.9 C)] 98.2 F (36.8 C) (03/14 1734) Pulse Rate:  [87-142] 87 (03/14 1734) Resp:  [17-34] 17 (03/14 1734) BP: (79-181)/(53-105) 79/53 (03/14 1734) SpO2:  [91 %-100 %] 99 % (03/14 1734) Weight:  [47.6 kg-48 kg] 48 kg (03/14 0344) Last BM Date: 08/15/18 General:   Pleasant, frail appearing appropriate for her age, cooperative in NAD Head:  Normocephalic and atraumatic. Eyes:   No icterus.   Conjunctiva pink. PERRLA. Ears: Significant hearing loss Neck:  Supple; no masses or thyroidomegaly Lungs:  Respirations even and unlabored. Lungs clear to auscultation bilaterally.   No wheezes, crackles, or rhonchi.  Heart:  Regular rate and rhythm;  Without murmur, clicks, rubs or gallops Abdomen:  Soft, nondistended, mild tenderness in the epigastric region. Normal bowel sounds. No appreciable masses or hepatomegaly.  No rebound or guarding.  Rectal:  Not performed. Msk:  Symmetrical without gross deformities.  Extremities:  Without edema, cyanosis or clubbing. Neurologic:  Alert and oriented x3;  grossly normal neurologically. Skin:  Intact without significant lesions or rashes. Cervical Nodes:  No significant cervical adenopathy. Psych:  Alert and cooperative. Normal affect.  LAB RESULTS: CBC Latest Ref Rng & Units 08/15/2018 08/14/2018 08/14/2018  WBC 4.0 - 10.5 K/uL 16.8(H) 17.9(H) 18.1(H)  Hemoglobin 12.0 - 15.0 g/dL 9.8(L) 11.0(L) 11.0(L)  Hematocrit 36.0 - 46.0 % 29.2(L) 33.5(L) 33.6(L)  Platelets 150 - 400 K/uL 216 292 284    BMET BMP Latest Ref Rng & Units 08/14/2018 07/13/2018 07/07/2018  Glucose 70 - 99 mg/dL 170(H) 84 91  BUN 8 - 23 mg/dL _0 Creatinine 0.44 - 1.00 mg/dL 0.84 0.90 1.14(H)  BUN/Creat Ratio 11 - 26 - - -  Sodium 135 - 145 mmol/L 134(L) 136 136  Potassium 3.5 - 5.1 mmol/L 3.6 4.8 4.0  Chloride 98 - 111 mmol/L 103 103 107  CO2 22 - 32 mmol/L _1 Calcium 8.9 - 10.3 mg/dL 8.7(L) 9.5 8.5(L)    LFT Hepatic Function Latest Ref Rng & Units 08/15/2018 08/14/2018 07/13/2018  Total Protein 6.5 - 8.1 g/dL 6.0(L) 6.7 6.8  Albumin 3.5 - 5.0 g/dL 2.9(L) 3.6 4.1  AST 15 - 41 U/L 242(H) 407(H) 22  ALT 0 - 44 U/L 182(H) 150(H) 34  Alk Phosphatase 38 - 126 U/L 138(H) 153(H) 55  Total Bilirubin 0.3 - 1.2 mg/dL 3.8(H) 1.5(H) 0.5  Bilirubin, Direct 0.0 - 0.2 mg/dL 2.8(H) 0.8(H) -     STUDIES: Dg Chest 2 View  Result Date: 08/14/2018 CLINICAL DATA:  85 year old female with mid chest pain. EXAM: CHEST - 2 VIEW COMPARISON:  CTA chest 07/05/2018 and earlier. FINDINGS:  Moderate scoliosis.  Stable mild cardiomegaly and tortuosity of the thoracic aorta. Other mediastinal contours are within normal limits. Visualized tracheal air column is within normal limits. No pneumothorax, pulmonary edema, pleural effusion or acute pulmonary opacity. Negative visible bowel gas pattern. IMPRESSION: Stable.  No acute cardiopulmonary abnormality. Electronically Signed   By: Genevie Ann M.D.   On: 08/14/2018 21:17   Ct Abdomen Pelvis W Contrast  Result Date: 08/15/2018 CLINICAL DATA:  Upper abdominal pain and nausea. History of gallstones and atrial fibrillation. EXAM: CT ABDOMEN AND PELVIS WITH CONTRAST TECHNIQUE: Multidetector CT imaging of the abdomen and pelvis was performed using the standard protocol following bolus administration of intravenous contrast. CONTRAST:  58m OMNIPAQUE IOHEXOL 300 MG/ML  SOLN COMPARISON:  MRI abdomen 07/06/2018. CT abdomen and pelvis 09/16/2017 FINDINGS: Lower chest: Small left pleural effusion. Atelectasis in both lung bases. Cardiac enlargement. Small esophageal hiatal hernia. Residual contrast material in the lower esophagus may represent reflux or dysmotility. Hepatobiliary: There is prominent intra and extrahepatic bile duct dilatation, increasing since the previous studies. Small stones are demonstrated in the gallbladder. Gallbladder wall is thickened. Gallbladder is mildly distended. Given these findings, this raises suspicion for an occult common bile duct stone although no radiopaque stone is demonstrated. Pancreas: Unremarkable. No pancreatic ductal dilatation or surrounding inflammatory changes. Spleen: Normal in size without focal abnormality. Adrenals/Urinary Tract: Adrenal glands are unremarkable. Kidneys are normal, without renal calculi, focal lesion, or hydronephrosis. Bladder is unremarkable. Stomach/Bowel: Stomach, small bowel, and colon are not abnormally distended. Prominence of gastric rugal folds is unchanged since prior study and could  represent gastritis. Scattered stool throughout the colon. Static colonic diverticula. No wall thickening or inflammatory changes. No evidence of diverticulitis. Vascular/Lymphatic: Aortic atherosclerosis. No enlarged abdominal or pelvic lymph nodes. Reproductive: Uterus and bilateral adnexa are unremarkable. Other: No free air or free fluid in the abdomen. Abdominal wall musculature appears intact. Musculoskeletal: Lumbar scoliosis convex towards the left. Degenerative changes in the lumbar spine. Old fracture deformities of the right superior and inferior pubic rami. Old compression deformity of L1 vertebra. IMPRESSION: 1. Intra and extrahepatic bile duct dilatation with gallbladder wall thickening and small stones. No radiopaque common bile duct stones are identified although changes are worrisome for an occult obstructing common bile duct stone. Consider ultrasound follow-up. 2. Small esophageal hiatal hernia with residual contrast material in the lower esophagus suggesting reflux or dysmotility. 3. Small left pleural effusion with basilar atelectasis. 4. Prominence of gastric rugal folds is unchanged since prior study and could indicate chronic gastritis. 5. Diverticulosis of the colon without evidence of diverticulitis. 6. Old fracture deformities of the right pubic rami and L1 vertebra. Lumbar scoliosis and degenerative changes. Aortic Atherosclerosis (ICD10-I70.0). Electronically Signed   By: WLucienne CapersM.D.   On: 08/15/2018 00:03   Mr 3d Recon At Scanner  Result Date: 08/15/2018 CLINICAL DATA:  Abnormal LFTs, cholelithiasis EXAM: MRI ABDOMEN WITHOUT AND WITH CONTRAST (INCLUDING MRCP) TECHNIQUE: Multiplanar multisequence MR imaging of the abdomen was performed both before and after the administration of intravenous contrast. Heavily T2-weighted images of the biliary and pancreatic ducts were obtained, and three-dimensional MRCP images were rendered by post processing. CONTRAST:  4 mL Gadovist IV  COMPARISON:  CT abdomen/pelvis dated 08/14/2018 and 09/16/2017. FINDINGS: Motion degraded images. Lower chest: Mild patchy opacities at the lung bases, likely dependent atelectasis. Hepatobiliary: Liver is within normal limits. No suspicious enhancing hepatic lesions. Layering small gallstones. Mild gallbladder distension. No pericholecystic fluid/inflammatory changes suggest acute cholecystitis. Mild intrahepatic ductal dilatation. Dilated common  duct, measuring 14 mm, new from 2019. Suspected 7 mm distal CBD stone just above the ampulla, although poorly visualized/evaluated. Pancreas:  Within normal limits. Spleen:  Within normal limits. Adrenals/Urinary Tract:  Adrenal glands are within normal limits. 8 mm right upper pole renal cyst. Left kidney is within normal limits. No hydronephrosis. Stomach/Bowel: Stomach is notable for a tiny hiatal hernia. Visualized bowel is grossly unremarkable. Vascular/Lymphatic:  No evidence of abdominal aortic aneurysm. No suspicious abdominal lymphadenopathy. Other:  No abdominal ascites. Musculoskeletal: Lower thoracolumbar levoscoliosis with mild degenerative changes. IMPRESSION: Cholelithiasis, without associated inflammatory changes suggest acute cholecystitis. Intrahepatic/extrahepatic ductal dilatation. Common duct measures 14 mm. These findings are new/progressive from 2019. Suspected 7 mm distal CBD stone just above the ampulla, poorly visualized/evaluated given motion degradation. Electronically Signed   By: Julian Hy M.D.   On: 08/15/2018 04:13   Mr Abdomen Mrcp Moise Boring Contast  Result Date: 08/15/2018 CLINICAL DATA:  Abnormal LFTs, cholelithiasis EXAM: MRI ABDOMEN WITHOUT AND WITH CONTRAST (INCLUDING MRCP) TECHNIQUE: Multiplanar multisequence MR imaging of the abdomen was performed both before and after the administration of intravenous contrast. Heavily T2-weighted images of the biliary and pancreatic ducts were obtained, and three-dimensional MRCP images were  rendered by post processing. CONTRAST:  4 mL Gadovist IV COMPARISON:  CT abdomen/pelvis dated 08/14/2018 and 09/16/2017. FINDINGS: Motion degraded images. Lower chest: Mild patchy opacities at the lung bases, likely dependent atelectasis. Hepatobiliary: Liver is within normal limits. No suspicious enhancing hepatic lesions. Layering small gallstones. Mild gallbladder distension. No pericholecystic fluid/inflammatory changes suggest acute cholecystitis. Mild intrahepatic ductal dilatation. Dilated common duct, measuring 14 mm, new from 2019. Suspected 7 mm distal CBD stone just above the ampulla, although poorly visualized/evaluated. Pancreas:  Within normal limits. Spleen:  Within normal limits. Adrenals/Urinary Tract:  Adrenal glands are within normal limits. 8 mm right upper pole renal cyst. Left kidney is within normal limits. No hydronephrosis. Stomach/Bowel: Stomach is notable for a tiny hiatal hernia. Visualized bowel is grossly unremarkable. Vascular/Lymphatic:  No evidence of abdominal aortic aneurysm. No suspicious abdominal lymphadenopathy. Other:  No abdominal ascites. Musculoskeletal: Lower thoracolumbar levoscoliosis with mild degenerative changes. IMPRESSION: Cholelithiasis, without associated inflammatory changes suggest acute cholecystitis. Intrahepatic/extrahepatic ductal dilatation. Common duct measures 14 mm. These findings are new/progressive from 2019. Suspected 7 mm distal CBD stone just above the ampulla, poorly visualized/evaluated given motion degradation. Electronically Signed   By: Julian Hy M.D.   On: 08/15/2018 04:13      Impression / Plan:   Kylie May is a 85 y.o. pleasant Caucasian female with chronic A. fib on Eliquis, congestive heart failure, known history of cholelithiasis, choledocholithiasis status post ERCP 10 years ago now with recurrence of symptomatic choledocholithiasis confirmed by MRCP, mild gallstone pancreatitis, leukocytosis, low-grade fever  consistent with mild to moderate ascending cholangitis  N.p.o., okay with ice chips Continue maintenance IV fluids Continue Zosyn Continue to hold Eliquis Monitor LFTs and CBC daily Blood cultures pending Waiting to be transferred to [facility] for ERCP.  Patient is accepted, waiting for bed.  I personally discussed her case with the on-call gastroenterologist at [facility], Dr. Paulita Fujita.  Given that her last dose of Eliquis was yesterday morning, he prefers to perform ERCP on Monday to give 72 hours time off Eliquis as long as the patient remains clinically stable with adequate antibiotic coverage.  Otherwise, he plans to perform ERCP sooner with stent placement only I updated patient's daughter and her grandson regarding the bed situation and my discussion with  Dr. Paulita Fujita  Thank you for involving me in the care of this patient.  We will follow along with you until patient is transferred to [facility]    LOS: 0 days   Sherri Sear, MD  08/15/2018, 5:37 PM   Note: This dictation was prepared with Dragon dictation along with smaller phrase technology. Any transcriptional errors that result from this process are unintentional.

## 2018-08-15 NOTE — Progress Notes (Signed)
Called Dr. Marius Ditch regarding patient's MRCP results.  Doctor Marius Ditch contacted Dr. Lysle Pearl to have patient transferred to hospital with ERCP capability.  Dr. Lysle Pearl called to inform me to call hospitalist to have patient transferred.  Day shift doctor on call will handle transfer procedure.  Christene Slates 08/15/2018  6:48 AM

## 2018-08-15 NOTE — Progress Notes (Signed)
Pt's BP noted to be 81/57 (65), Dr. Orlean Patten notified and to the bedside during notification. Pt currently asymptomatic showing no signs of distress, with family at the bedside. RN to continue to monitor

## 2018-08-15 NOTE — Progress Notes (Addendum)
Update:  Notified patient family members re: transfer to facility with ERCP capabilities. Called radiology office to obtain results and also reviewed images myself to confirm CBD stone.  Explained to them concerns for CBD stone causing cholangitis and that ERCP recommended to prevent further deterioration in health.  Case discussed with Dr. Marius Ditch over the phone and she is in agreement.  Recommend continue to hold eliquis, and continue zosyn for now.  Dr. Marius Ditch to add flagyl for additional coverage.   I explained to them that since I will not be performing the ERCP, specific questions regarding the r/b/a to procedure will need to be discussed with eventual GI provider that will be performing procedure.  This is also true for the possible cholecystectomy afterwards.  I briefly discussed the r/b/a for the cholecystectomy with them, but emphasized that ultimate recommendations and decisions will be with the surgeon at the transferred facility.  I explained to them the transfer process and how it will be managed by the admitting hospitalist team.  I reassured them that we are all working toward the same goal, and working as efficiently as possible to facilitate transfer, since they specifically voiced some disapproval re: the sense of delay in care and lack of communication.  All questions addressed at the time of discussion.  Further care and coordination per primary team.  General surgery will peripherally follow until transfer is completed  Greater than 54min of care was required for coordination/discussion of patient care since the initial consult

## 2018-08-15 NOTE — H&P (Signed)
[facility] History and Physical  Kylie May IDP:[id number] DOB: 01-Dec-1921 DOA: 08/15/2018  Referring physician: Sherri Sear, MD [facility] PCP: [name], MD   Chief Complaint: Severe epigastric pain radiating to back  HPI: Kylie May is a 85 y.o. WF PMHx A. fib, chronic diastolic CHF, mitral valve prolapse, HLD, diverticulitis, esophageal stricture, Schatzki's ring, fibrocystic breast disease,  Admitted to [facility] secondary to chest pain.Notably the patient reported to the ED staff that the pain felt similar to when she had her gallbladder removed.  CT of her abdomen showed intra-and hepatic duct dilatation and gallbladder wall thickening but no stones.  (The patient also has a small hiatal hernia).  She was also noted to have atrial fibrillation with rapid ventricular rate that improved following diltiazem.  Laboratory evaluation also revealed transaminitis.  results indicated need for ERCP based on dilated CBD 14 mm with suspected 7 mm distal CBD stone. She is strict NPO, they are holding Eliquis.Kylie May does not have ERCP capability at this time and so the decision was made to transfer the patient. She does not appear to be on a Cardizem drip and takes Flecainide.  Patient's family states unsure of why she has diagnosis of CHF and record.   Patient was recently at [facility] on 07/06/2018 secondary to elevated LFTs and also found to have mild intrahepatic and extrahepatic biliary dilation with no evidence of choledocholithiasis on CT and MRCP, prominent CBD.  She was empirically started on antibiotics, LFTs normalized and ERCP was deferred at that time due to her age and medical condition and she may have passed the stone.  Her LFTs were completely normal after discharge. She now presents again with severe epigastric pain radiating to the back associated with nausea, elevated LFTs, AP 153, AST 407, ALT 150, total bilirubin 1.5, lipase  107, initial CT abdomen and pelvis in the ER revealed intra-and extrahepatic bile duct dilation with gallbladder wall thickening, cholelithiasis and possible stone in the common bile duct.  Subsequently underwent MRCP which confirmed presence of choledocholithiasis, distal CBD stone 7 mm in size, CBD measuring 14 mm, mild intrahepatic ductal dilation.  Patient's last dose of Eliquis was on 08/31/2018 at 9 AM. Patient currently comfortable but hungry.     Review of Systems:  Constitutional:  No weight loss, night sweats, Fevers, chills, fatigue.  HEENT:  No headaches, Difficulty swallowing,Tooth/dental problems,Sore throat,  No sneezing, itching, ear ache, nasal congestion, post nasal drip,  Cardio-vascular:  No chest pain, Orthopnea, PND, swelling in lower extremities, anasarca, dizziness, palpitations  GI:  No heartburn, indigestion, positive abdominal pain, positive nausea, negative vomiting, diarrhea, change in bowel habits, loss of appetite  Resp:  No shortness of breath with exertion or at rest. No excess mucus, no productive cough, No non-productive cough, No coughing up of blood.No change in color of mucus.No wheezing.No chest wall deformity  Skin:  no rash or lesions.  GU:  no dysuria, change in color of urine, no urgency or frequency. No flank pain.  Musculoskeletal:  No joint pain or swelling. No decreased range of motion. No back pain.  Psych:  No change in mood or affect. No depression or anxiety. No memory loss.      Past Medical History:  Diagnosis Date   A-fib ([place])    Acid reflux disease    Arrhythmia    Atrial fibrillation with RVR (HCC) 08/15/2018   Chronic diastolic CHF (congestive heart failure) (HCC)    Compression  fracture of lumbar vertebra ([place])    Diverticulitis 01-2008   GI [place]   Diverticulosis    Esophageal stricture 2006   Femoral bruit    Right   Fibrocystic breast disease    Hiatal hernia    Hypercholesterolemia     [place] study LDL goal = < 160   Mitral regurgitation    a. 06/2014 EF 55-60%, elevated end-diastolic pressures, dilated LA at 4.3 cm, mildly dilated RA, severe mitral regurgitation, mild aortic sclerosis without stenosis, mod-severe TR   Mitral valve prolapse    Osteoporosis    Dr Matthew Saras   Pancreatitis 07-2008   Hospitalized    Schatzki's ring    Sepsis ([place]) 08/15/2018   Skin cancer    facial x 2. [name]   Past Surgical History:  Procedure Laterality Date   COLONOSCOPY  2006   Epidural steroids     x1   ESOPHAGEAL DILATION  2006   G3 P4 (twins)     SKIN CANCER EXCISION     Social History:  reports that she quit smoking about 67 years ago. She has never used smokeless tobacco. She reports current alcohol use of about 7.0 standard drinks of alcohol per week. She reports that she does not use drugs.  Allergies  Allergen Reactions   Metronidazole     Other reaction(s): Dizziness and giddiness (finding), Other (qualifier value) made things feel like they were vibrating like the bed and floor,also caused mild dizziness weakness   Penicillin G Rash    Family History  Problem Relation Age of Onset   Breast cancer Maternal Aunt    Hepatitis Daughter        C   Lung disease Mother        lung tumor   Heart attack Daughter 77       S/P stents   Heart attack Sister 58   Diabetes Neg Hx    Stroke Neg Hx       Prior to Admission medications   Medication Sig Start Date End Date Taking? Authorizing Provider  carvedilol (COREG) 3.125 MG tablet Take 1 tablet (3.125 mg total) by mouth 2 (two) times daily with a meal. 08/04/18   Gollan, Kathlene November, MD  flecainide (TAMBOCOR) 50 MG tablet Take 1 tablet (50 mg total) by mouth 2 (two) times daily. 08/04/18   Minna Merritts, MD  magnesium chloride (SLOW-MAG) 64 MG TBEC SR tablet Take 2 tablets (128 mg total) by mouth daily. 12/18/17   [name], MD  omeprazole (PRILOSEC) 40 MG capsule Take 1 capsule (40  mg total) by mouth daily. 05/13/18 08/15/18  [name], MD  rosuvastatin (CRESTOR) 20 MG tablet Take 1 tablet (20 mg total) by mouth daily. 08/04/18   Minna Merritts, MD  triamcinolone ointment (KENALOG) 0.1 %  07/29/18   [provider]     Consultants:  Cephas Darby, MD  GI Dr. St[facility]    Procedures/Significant Events:  3/13 CXR: Negative acute cardiopulmonary abnormality 3/14 CT abdomen pelvis with contrast:I-ntra and extrahepatic bile duct dilatation with gallbladder wall thickening and small stones.  -Changes are worrisome for an occult obstructing common bile duct stone. Consider ultrasound follow-up. -Small esophageal hiatal hernia with residual contrast material inthe lower esophagus suggesting reflux or dysmotility. -Small left pleural effusion with basilar atelectasis. - Prominence of gastric rugal folds is unchanged since prior study and could indicate chronic gastritis. -Diverticulosis of the colon without evidence of diverticulitis. - Old  fracture deformities of the right pubic rami and L1 vertebra.Lumbar scoliosis and degenerative changes. 3/14 MRCP abdomen W/Wo contrast:-Cholelithiasis, without associated inflammatory changes suggest acute cholecystitis. -Intrahepatic/extrahepatic ductal dilatation. Common duct measures 14 mm. These findings are new/progressive from 2019. -suspected 7 mm distal CBD stone just above the ampulla, poorly visualized/evaluated given motion degradation.   I have personally reviewed and interpreted all radiology studies and my findings are as above.   VENTILATOR SETTINGS: None   Cultures 3/14 blood pending    Antimicrobials: Anti-infectives (From admission, onward)   Start     Ordered Stop   08/15/18 2200  cefTRIAXone (ROCEPHIN) 2 g in sodium chloride 0.9 % 100 mL IVPB  Status:  Discontinued     08/15/18 1957 08/15/18 2005   08/15/18 2200  piperacillin-tazobactam (ZOSYN) IVPB 3.375 g     08/15/18  2036     08/15/18 2000  metroNIDAZOLE (FLAGYL) IVPB 500 mg  Status:  Discontinued     08/15/18 1957 08/15/18 2005       Devices    LINES / TUBES:      Continuous Infusions:  Physical Exam: There were no vitals filed for this visit.  Wt Readings from Last 3 Encounters:  08/15/18 48 kg  07/13/18 46.4 kg  07/05/18 47.2 kg    General: No acute respiratory distress, cachectic, extremely hard of hearing Eyes: negative scleral hemorrhage, negative anisocoria, negative icterus ENT: Negative Runny nose, negative gingival bleeding, Neck:  Negative scars, masses, torticollis, lymphadenopathy, JVD Lungs: Clear to auscultation bilaterally without wheezes or crackles Cardiovascular: Regular rate and rhythm without murmur gallop or rub normal S1 and S2 Abdomen: negative abdominal pain, nondistended, positive soft, bowel sounds, no rebound, no ascites, no appreciable mass Extremities: No significant cyanosis, clubbing, or edema bilateral lower extremities Skin: Negative rashes, lesions, ulcers Psychiatric:  Negative depression, negative anxiety, negative fatigue, negative mania  Central nervous system:  Cranial nerves II through XII intact, tongue/uvula midline, all extremities muscle strength 5/5, sensation intact throughout, negative dysarthria, negative expressive aphasia, negative receptive aphasia.        Labs on Admission:  Basic Metabolic Panel: Recent Labs  Lab 08/14/18 2055  NA 134*  K 3.6  CL 103  CO2 22  GLUCOSE 170*  BUN 13  CREATININE 0.84  CALCIUM 8.7*   Liver Function Tests: Recent Labs  Lab 08/14/18 2055 08/15/18 1413  AST 407* 242*  ALT 150* 182*  ALKPHOS 153* 138*  BILITOT 1.5* 3.8*  PROT 6.7 6.0*  ALBUMIN 3.6 2.9*   Recent Labs  Lab 08/14/18 2055  LIPASE 107*   No results for input(s): AMMONIA in the last 168 hours. CBC: Recent Labs  Lab 08/14/18 2055 08/15/18 1413  WBC 17.9*   18.1* 16.8*  NEUTROABS 15.6*  --   HGB 11.0*   11.0* 9.8*    HCT 33.5*   33.6* 29.2*  MCV 88.9   88.9 86.6  PLT 292   284 216   Cardiac Enzymes: Recent Labs  Lab 08/14/18 2055  TROPONINI <0.03    BNP (last 3 results) No results for input(s): BNP in the last 8760 hours.  ProBNP (last 3 results) No results for input(s): PROBNP in the last 8760 hours.  CBG: No results for input(s): GLUCAP in the last 168 hours.  Radiological Exams on Admission: Dg Chest 2 View  Result Date: 08/14/2018 CLINICAL DATA:  85 year old female with mid chest pain. EXAM: CHEST - 2 VIEW COMPARISON:  CTA chest 07/05/2018 and earlier. FINDINGS: Moderate scoliosis. Stable  mild cardiomegaly and tortuosity of the thoracic aorta. Other mediastinal contours are within normal limits. Visualized tracheal air column is within normal limits. No pneumothorax, pulmonary edema, pleural effusion or acute pulmonary opacity. Negative visible bowel gas pattern. IMPRESSION: Stable.  No acute cardiopulmonary abnormality. Electronically Signed   By: Genevie Ann M.D.   On: 08/14/2018 21:17   Ct Abdomen Pelvis W Contrast  Result Date: 08/15/2018 CLINICAL DATA:  Upper abdominal pain and nausea. History of gallstones and atrial fibrillation. EXAM: CT ABDOMEN AND PELVIS WITH CONTRAST TECHNIQUE: Multidetector CT imaging of the abdomen and pelvis was performed using the standard protocol following bolus administration of intravenous contrast. CONTRAST:  73mL OMNIPAQUE IOHEXOL 300 MG/ML  SOLN COMPARISON:  MRI abdomen 07/06/2018. CT abdomen and pelvis 09/16/2017 FINDINGS: Lower chest: Small left pleural effusion. Atelectasis in both lung bases. Cardiac enlargement. Small esophageal hiatal hernia. Residual contrast material in the lower esophagus may represent reflux or dysmotility. Hepatobiliary: There is prominent intra and extrahepatic bile duct dilatation, increasing since the previous studies. Small stones are demonstrated in the gallbladder. Gallbladder wall is thickened. Gallbladder is mildly distended.  Given these findings, this raises suspicion for an occult common bile duct stone although no radiopaque stone is demonstrated. Pancreas: Unremarkable. No pancreatic ductal dilatation or surrounding inflammatory changes. Spleen: Normal in size without focal abnormality. Adrenals/Urinary Tract: Adrenal glands are unremarkable. Kidneys are normal, without renal calculi, focal lesion, or hydronephrosis. Bladder is unremarkable. Stomach/Bowel: Stomach, small bowel, and colon are not abnormally distended. Prominence of gastric rugal folds is unchanged since prior study and could represent gastritis. Scattered stool throughout the colon. Static colonic diverticula. No wall thickening or inflammatory changes. No evidence of diverticulitis. Vascular/Lymphatic: Aortic atherosclerosis. No enlarged abdominal or pelvic lymph nodes. Reproductive: Uterus and bilateral adnexa are unremarkable. Other: No free air or free fluid in the abdomen. Abdominal wall musculature appears intact. Musculoskeletal: Lumbar scoliosis convex towards the left. Degenerative changes in the lumbar spine. Old fracture deformities of the right superior and inferior pubic rami. Old compression deformity of L1 vertebra. IMPRESSION: 1. Intra and extrahepatic bile duct dilatation with gallbladder wall thickening and small stones. No radiopaque common bile duct stones are identified although changes are worrisome for an occult obstructing common bile duct stone. Consider ultrasound follow-up. 2. Small esophageal hiatal hernia with residual contrast material in the lower esophagus suggesting reflux or dysmotility. 3. Small left pleural effusion with basilar atelectasis. 4. Prominence of gastric rugal folds is unchanged since prior study and could indicate chronic gastritis. 5. Diverticulosis of the colon without evidence of diverticulitis. 6. Old fracture deformities of the right pubic rami and L1 vertebra. Lumbar scoliosis and degenerative changes. Aortic  Atherosclerosis (ICD10-I70.0). Electronically Signed   By: Lucienne Capers M.D.   On: 08/15/2018 00:03   Mr 3d Recon At Scanner  Result Date: 08/15/2018 CLINICAL DATA:  Abnormal LFTs, cholelithiasis EXAM: MRI ABDOMEN WITHOUT AND WITH CONTRAST (INCLUDING MRCP) TECHNIQUE: Multiplanar multisequence MR imaging of the abdomen was performed both before and after the administration of intravenous contrast. Heavily T2-weighted images of the biliary and pancreatic ducts were obtained, and three-dimensional MRCP images were rendered by post processing. CONTRAST:  4 mL Gadovist IV COMPARISON:  CT abdomen/pelvis dated 08/14/2018 and 09/16/2017. FINDINGS: Motion degraded images. Lower chest: Mild patchy opacities at the lung bases, likely dependent atelectasis. Hepatobiliary: Liver is within normal limits. No suspicious enhancing hepatic lesions. Layering small gallstones. Mild gallbladder distension. No pericholecystic fluid/inflammatory changes suggest acute cholecystitis. Mild intrahepatic ductal dilatation. Dilated common duct,  measuring 14 mm, new from 2019. Suspected 7 mm distal CBD stone just above the ampulla, although poorly visualized/evaluated. Pancreas:  Within normal limits. Spleen:  Within normal limits. Adrenals/Urinary Tract:  Adrenal glands are within normal limits. 8 mm right upper pole renal cyst. Left kidney is within normal limits. No hydronephrosis. Stomach/Bowel: Stomach is notable for a tiny hiatal hernia. Visualized bowel is grossly unremarkable. Vascular/Lymphatic:  No evidence of abdominal aortic aneurysm. No suspicious abdominal lymphadenopathy. Other:  No abdominal ascites. Musculoskeletal: Lower thoracolumbar levoscoliosis with mild degenerative changes. IMPRESSION: Cholelithiasis, without associated inflammatory changes suggest acute cholecystitis. Intrahepatic/extrahepatic ductal dilatation. Common duct measures 14 mm. These findings are new/progressive from 2019. Suspected 7 mm distal CBD  stone just above the ampulla, poorly visualized/evaluated given motion degradation. Electronically Signed   By: Julian Hy M.D.   On: 08/15/2018 04:13   Mr Abdomen Mrcp Moise Boring Contast  Result Date: 08/15/2018 CLINICAL DATA:  Abnormal LFTs, cholelithiasis EXAM: MRI ABDOMEN WITHOUT AND WITH CONTRAST (INCLUDING MRCP) TECHNIQUE: Multiplanar multisequence MR imaging of the abdomen was performed both before and after the administration of intravenous contrast. Heavily T2-weighted images of the biliary and pancreatic ducts were obtained, and three-dimensional MRCP images were rendered by post processing. CONTRAST:  4 mL Gadovist IV COMPARISON:  CT abdomen/pelvis dated 08/14/2018 and 09/16/2017. FINDINGS: Motion degraded images. Lower chest: Mild patchy opacities at the lung bases, likely dependent atelectasis. Hepatobiliary: Liver is within normal limits. No suspicious enhancing hepatic lesions. Layering small gallstones. Mild gallbladder distension. No pericholecystic fluid/inflammatory changes suggest acute cholecystitis. Mild intrahepatic ductal dilatation. Dilated common duct, measuring 14 mm, new from 2019. Suspected 7 mm distal CBD stone just above the ampulla, although poorly visualized/evaluated. Pancreas:  Within normal limits. Spleen:  Within normal limits. Adrenals/Urinary Tract:  Adrenal glands are within normal limits. 8 mm right upper pole renal cyst. Left kidney is within normal limits. No hydronephrosis. Stomach/Bowel: Stomach is notable for a tiny hiatal hernia. Visualized bowel is grossly unremarkable. Vascular/Lymphatic:  No evidence of abdominal aortic aneurysm. No suspicious abdominal lymphadenopathy. Other:  No abdominal ascites. Musculoskeletal: Lower thoracolumbar levoscoliosis with mild degenerative changes. IMPRESSION: Cholelithiasis, without associated inflammatory changes suggest acute cholecystitis. Intrahepatic/extrahepatic ductal dilatation. Common duct measures 14 mm. These findings  are new/progressive from 2019. Suspected 7 mm distal CBD stone just above the ampulla, poorly visualized/evaluated given motion degradation. Electronically Signed   By: Julian Hy M.D.   On: 08/15/2018 04:13    EKG: Independently reviewed.  A. fib with RVR, old atrial septal infarct.   Assessment/Plan Active Problems:   Hyperlipidemia   Mitral regurgitation   Chronic diastolic CHF (congestive heart failure) (HCC)   Transaminitis   Choledocholithiasis with acute cholecystitis   Atrial fibrillation with RVR (HCC)   Sepsis (HCC)   Choledocholithiasis with acute cholecystitis - Continue current antibiotics  -Gently hydrate normal saline 75 mll/hr.  Monitor closely for fluid overload -last dose of Eliquis was on 08/31/2018 at 9 AM.  Counseled patient and family will need to wait until Monday for ERCP. - Dr. Stacie Glaze GI should evaluate patient in the a.m.  Sepsis -On admission criteria for sepsis HR> 90, WBC> 12.  Known site of infection. - See choledocholithiasis  Transaminitis - Secondary to choledocholithiasis.  Monitor closely  Chronic diastolic CHF - Per family unsure why patient has diagnosis of CHF. - Strict in and out -Daily weight - Echocardiogram pending to evaluate for CHF  A. fib with RVR -Was on Eliquis but discontinued in preparation for ERCP -  On exam patient NSR, continue patient on telemetry.  Patient most likely went into A. fib secondary to infection and abdominal pain. - If patient goes back into A. fib with RVR would start Cardizem drip (patient n.p.o. therefore cannot take flecainide).       Code Status: Full (DVT Prophylaxis: SCD Family Communication: Whole family present Disposition Plan: Per GI   Data Reviewed: Care during the described time interval was provided by me .  I have reviewed this patient's available data, including medical history, events of note, physical examination, and all test results as part of my evaluation.   Time  spent: 60 min  [place], [facility] Pager [phone]

## 2018-08-16 ENCOUNTER — Other Ambulatory Visit (HOSPITAL_COMMUNITY): Payer: Medicare Other

## 2018-08-16 ENCOUNTER — Inpatient Hospital Stay (HOSPITAL_COMMUNITY): Payer: Medicare Other

## 2018-08-16 ENCOUNTER — Encounter (HOSPITAL_COMMUNITY): Payer: Self-pay | Admitting: Interventional Radiology

## 2018-08-16 DIAGNOSIS — K8309 Other cholangitis: Secondary | ICD-10-CM

## 2018-08-16 DIAGNOSIS — R6521 Severe sepsis with septic shock: Secondary | ICD-10-CM

## 2018-08-16 DIAGNOSIS — R579 Shock, unspecified: Secondary | ICD-10-CM

## 2018-08-16 DIAGNOSIS — K8042 Calculus of bile duct with acute cholecystitis without obstruction: Secondary | ICD-10-CM

## 2018-08-16 DIAGNOSIS — A419 Sepsis, unspecified organism: Secondary | ICD-10-CM

## 2018-08-16 DIAGNOSIS — I4891 Unspecified atrial fibrillation: Secondary | ICD-10-CM

## 2018-08-16 DIAGNOSIS — Z7189 Other specified counseling: Secondary | ICD-10-CM

## 2018-08-16 HISTORY — PX: IR PERC CHOLECYSTOSTOMY: IMG2326

## 2018-08-16 LAB — CBC WITH DIFFERENTIAL/PLATELET
Abs Immature Granulocytes: 0.22 10*3/uL — ABNORMAL HIGH (ref 0.00–0.07)
Basophils Absolute: 0 10*3/uL (ref 0.0–0.1)
Basophils Relative: 0 %
EOS PCT: 0 %
Eosinophils Absolute: 0 10*3/uL (ref 0.0–0.5)
HCT: 31.2 % — ABNORMAL LOW (ref 36.0–46.0)
Hemoglobin: 10 g/dL — ABNORMAL LOW (ref 12.0–15.0)
Immature Granulocytes: 2 %
Lymphocytes Relative: 6 %
Lymphs Abs: 0.9 10*3/uL (ref 0.7–4.0)
MCH: 27.8 pg (ref 26.0–34.0)
MCHC: 32.1 g/dL (ref 30.0–36.0)
MCV: 86.7 fL (ref 80.0–100.0)
Monocytes Absolute: 0.7 10*3/uL (ref 0.1–1.0)
Monocytes Relative: 5 %
Neutro Abs: 13 10*3/uL — ABNORMAL HIGH (ref 1.7–7.7)
Neutrophils Relative %: 87 %
Platelets: 204 10*3/uL (ref 150–400)
RBC: 3.6 MIL/uL — ABNORMAL LOW (ref 3.87–5.11)
RDW: 13.2 % (ref 11.5–15.5)
WBC: 14.8 10*3/uL — ABNORMAL HIGH (ref 4.0–10.5)
nRBC: 0 % (ref 0.0–0.2)

## 2018-08-16 LAB — BASIC METABOLIC PANEL
Anion gap: 10 (ref 5–15)
Anion gap: 6 (ref 5–15)
BUN: 16 mg/dL (ref 8–23)
BUN: 24 mg/dL — ABNORMAL HIGH (ref 8–23)
CO2: 19 mmol/L — ABNORMAL LOW (ref 22–32)
CO2: 20 mmol/L — ABNORMAL LOW (ref 22–32)
CREATININE: 1.17 mg/dL — AB (ref 0.44–1.00)
CREATININE: 1.23 mg/dL — AB (ref 0.44–1.00)
Calcium: 8 mg/dL — ABNORMAL LOW (ref 8.9–10.3)
Calcium: 7.6 mg/dL — ABNORMAL LOW (ref 8.9–10.3)
Chloride: 106 mmol/L (ref 98–111)
Chloride: 109 mmol/L (ref 98–111)
GFR calc Af Amer: 46 mL/min — ABNORMAL LOW (ref >60)
GFR calc Af Amer: 43 mL/min — ABNORMAL LOW (ref >60)
GFR calc non Af Amer: 40 mL/min — ABNORMAL LOW (ref >60)
GFR calc non Af Amer: 38 mL/min — ABNORMAL LOW (ref >60)
Glucose, Bld: 88 mg/dL (ref 70–99)
Glucose, Bld: 77 mg/dL (ref 70–99)
Potassium: 3.8 mmol/L (ref 3.5–5.1)
Potassium: 3.8 mmol/L (ref 3.5–5.1)
SODIUM: 135 mmol/L (ref 135–145)
Sodium: 135 mmol/L (ref 135–145)

## 2018-08-16 LAB — HEPATIC FUNCTION PANEL
ALT: 117 U/L — ABNORMAL HIGH (ref 0–44)
AST: 106 U/L — AB (ref 15–41)
Albumin: 2.5 g/dL — ABNORMAL LOW (ref 3.5–5.0)
Alkaline Phosphatase: 128 U/L — ABNORMAL HIGH (ref 38–126)
BILIRUBIN DIRECT: 1.8 mg/dL — AB (ref 0.0–0.2)
Indirect Bilirubin: 0.8 mg/dL (ref 0.3–0.9)
Total Bilirubin: 2.6 mg/dL — ABNORMAL HIGH (ref 0.3–1.2)
Total Protein: 5.2 g/dL — ABNORMAL LOW (ref 6.5–8.1)

## 2018-08-16 LAB — MAGNESIUM
MAGNESIUM: 2 mg/dL (ref 1.7–2.4)
Magnesium: 1.9 mg/dL (ref 1.7–2.4)

## 2018-08-16 LAB — LACTIC ACID, PLASMA
LACTIC ACID, VENOUS: 1 mmol/L (ref 0.5–1.9)
Lactic Acid, Venous: 0.9 mmol/L (ref 0.5–1.9)

## 2018-08-16 LAB — GLUCOSE, CAPILLARY
Glucose-Capillary: 82 mg/dL (ref 70–99)
Glucose-Capillary: 76 mg/dL (ref 70–99)

## 2018-08-16 LAB — MRSA PCR SCREENING: MRSA by PCR: NEGATIVE

## 2018-08-16 MED ORDER — FENTANYL CITRATE (PF) 100 MCG/2ML IJ SOLN
INTRAMUSCULAR | Status: AC
  Filled 2018-08-16: qty 2

## 2018-08-16 MED ORDER — SODIUM CHLORIDE 0.9 % IV SOLN: 250.0000 mL | INTRAVENOUS | Status: DC

## 2018-08-16 MED ORDER — WHITE PETROLATUM EX OINT
TOPICAL_OINTMENT | CUTANEOUS | Status: AC
  Administered 2018-08-16: 0.2
  Filled 2018-08-16: qty 28.35

## 2018-08-16 MED ORDER — ORAL CARE MOUTH RINSE
15.0000 mL | Freq: Two times a day (BID) | OROMUCOSAL | Status: DC
  Administered 2018-08-16 – 2018-08-26 (×11): 15 mL via OROMUCOSAL

## 2018-08-16 MED ORDER — DILTIAZEM HCL 25 MG/5ML IV SOLN
10.0000 mg | Freq: Once | INTRAVENOUS | Status: AC
  Administered 2018-08-16: 10 mg via INTRAVENOUS
  Filled 2018-08-16: qty 5

## 2018-08-16 MED ORDER — NOREPINEPHRINE 4 MG/250ML-% IV SOLN
INTRAVENOUS | Status: AC
  Filled 2018-08-16: qty 250

## 2018-08-16 MED ORDER — LIDOCAINE HCL 1 % IJ SOLN
INTRAMUSCULAR | Status: AC
  Filled 2018-08-16: qty 20

## 2018-08-16 MED ORDER — VANCOMYCIN HCL IN DEXTROSE 1-5 GM/200ML-% IV SOLN
INTRAVENOUS | Status: AC
  Filled 2018-08-16: qty 200

## 2018-08-16 MED ORDER — WHITE PETROLATUM EX OINT: TOPICAL_OINTMENT | CUTANEOUS | Status: DC | PRN

## 2018-08-16 MED ORDER — METOPROLOL TARTRATE 5 MG/5ML IV SOLN
5.0000 mg | Freq: Once | INTRAVENOUS | Status: AC
  Administered 2018-08-16: 5 mg via INTRAVENOUS
  Filled 2018-08-16: qty 5

## 2018-08-16 MED ORDER — SODIUM CHLORIDE 0.9 % IV BOLUS
500.0000 mL | Freq: Once | INTRAVENOUS | Status: AC
  Administered 2018-08-16: 500 mL via INTRAVENOUS

## 2018-08-16 MED ORDER — CARVEDILOL 3.125 MG PO TABS
3.1250 mg | ORAL_TABLET | Freq: Two times a day (BID) | ORAL | Status: DC
  Administered 2018-08-16 – 2018-08-17 (×2): 3.125 mg via ORAL
  Filled 2018-08-16 (×2): qty 1

## 2018-08-16 MED ORDER — NOREPINEPHRINE 4 MG/250ML-% IV SOLN
0.0000 ug/min | INTRAVENOUS | Status: DC
  Administered 2018-08-16: 5 ug/min via INTRAVENOUS

## 2018-08-16 MED ORDER — MIDAZOLAM HCL 2 MG/2ML IJ SOLN
INTRAMUSCULAR | Status: AC
  Filled 2018-08-16: qty 2

## 2018-08-16 MED ORDER — MIDAZOLAM HCL 2 MG/2ML IJ SOLN
INTRAMUSCULAR | Status: AC | PRN
  Administered 2018-08-16 (×3): 0.5 mg via INTRAVENOUS

## 2018-08-16 MED ORDER — IOHEXOL 300 MG/ML  SOLN
100.0000 mL | Freq: Once | INTRAMUSCULAR | Status: AC | PRN
  Administered 2018-08-16: 50 mL via INTRAVENOUS

## 2018-08-16 MED ORDER — SODIUM CHLORIDE 0.9% FLUSH
5.0000 mL | Freq: Three times a day (TID) | INTRAVENOUS | Status: DC
  Administered 2018-08-16 – 2018-08-20 (×11): 5 mL
  Administered 2018-08-21: 3 mL
  Administered 2018-08-21 – 2018-08-26 (×11): 5 mL

## 2018-08-16 MED ORDER — FENTANYL CITRATE (PF) 100 MCG/2ML IJ SOLN
INTRAMUSCULAR | Status: AC | PRN
  Administered 2018-08-16 (×2): 12.5 ug via INTRAVENOUS
  Administered 2018-08-16: 25 ug via INTRAVENOUS

## 2018-08-16 MED ORDER — SODIUM CHLORIDE 0.9 % IV BOLUS
1000.0000 mL | Freq: Once | INTRAVENOUS | Status: AC
  Administered 2018-08-16: 1000 mL via INTRAVENOUS

## 2018-08-16 MED ORDER — SODIUM CHLORIDE 0.9 % IV SOLN
INTRAVENOUS | Status: DC
  Administered 2018-08-18 – 2018-08-19 (×2): via INTRAVENOUS

## 2018-08-16 NOTE — Consult Note (Signed)
[facility] Consultation Note  Referring Provider: [facility] Primary Care Physician:  [name], MD  Reason for Consultation:  Bile duct stone, elevated liver enzymes, abdominal pain  HPI: Kylie May is a 85 y.o. female presenting with above symptoms.  Had pain about one month ago, hospitalized elsewhere, though she had passed bile duct stone; doesn't sound like any intervention was done.  Few days ago, symptoms recurred, epigastric and right upper quadrant intermittent abdominal pain with jaundice and jaundice and fevers. Went back to [facility] and had elevated liver enzymes and suspected distal bile duct stone.  On apixaban for atrial fibrillation, last dose < 48 hours ago.  Patient states her pain has improved, but not resolved.  Leukocytosis last night improving.   Past Medical History:  Diagnosis Date  . A-fib ([place])   . Acid reflux disease   . Arrhythmia   . Atrial fibrillation with RVR ([place]) 08/15/2018  . Chronic diastolic CHF (congestive heart failure) ([place])   . Compression fracture of lumbar vertebra ([place])   . Diverticulitis 01-2008   GI [place]  . Diverticulosis   . Esophageal stricture 2006  . Femoral bruit    Right  . Fibrocystic breast disease   . Hiatal hernia   . Hypercholesterolemia    [place] study LDL goal = < 160  . Mitral regurgitation    a. 06/2014 EF 55-60%, elevated end-diastolic pressures, dilated LA at 4.3 cm, mildly dilated RA, severe mitral regurgitation, mild aortic sclerosis without stenosis, mod-severe TR  . Mitral valve prolapse   . Osteoporosis    Dr Matthew Saras  . Pancreatitis 07-2008   Hospitalized   . Schatzki's ring   . Sepsis ([place]) 08/15/2018  . Skin cancer    facial x 2. [name]    Past Surgical History:  Procedure Laterality Date  . COLONOSCOPY  2006  . Epidural steroids     x1  . ESOPHAGEAL DILATION  2006  . G3 P4 (twins)    . SKIN CANCER EXCISION      Prior to Admission medications    Medication Sig Start Date End Date Taking? Authorizing Provider  carvedilol (COREG) 3.125 MG tablet Take 1 tablet (3.125 mg total) by mouth 2 (two) times daily with a meal. 08/04/18   Gollan, Kathlene November, MD  flecainide (TAMBOCOR) 50 MG tablet Take 1 tablet (50 mg total) by mouth 2 (two) times daily. 08/04/18   Minna Merritts, MD  magnesium chloride (SLOW-MAG) 64 MG TBEC SR tablet Take 2 tablets (128 mg total) by mouth daily. 12/18/17   [name], MD  omeprazole (PRILOSEC) 40 MG capsule Take 1 capsule (40 mg total) by mouth daily. 05/13/18 08/15/18  [name], MD  rosuvastatin (CRESTOR) 20 MG tablet Take 1 tablet (20 mg total) by mouth daily. 08/04/18   Minna Merritts, MD  triamcinolone ointment (KENALOG) 0.1 %  07/29/18   [provider]    Current Facility-Administered Medications  Medication Dose Route Frequency Provider Last Rate Last Dose  . 0.9 %  sodium chloride infusion   Intravenous Continuous Aline August, MD 100 mL/hr at 08/16/18 0948    . diphenhydrAMINE (BENADRYL) injection 12.5 mg  12.5 mg Intravenous Q8H PRN Allie Bossier, MD      . ketorolac (TORADOL) 15 MG/ML injection 15 mg  15 mg Intravenous Q6H PRN Allie Bossier, MD      . morphine 2 MG/ML injection 0.5-2 mg  0.5-2 mg Intravenous Q6H PRN Dia Crawford  J, MD      . piperacillin-tazobactam (ZOSYN) IVPB 3.375 g  3.375 g Intravenous Q8H Allie Bossier, MD 12.5 mL/hr at 08/16/18 0528 3.375 g at 08/16/18 0528    Allergies as of 08/15/2018 - Review Complete 08/15/2018  Allergen Reaction Noted  . Metronidazole  11/11/2006  . Penicillin g Rash 08/11/2015    Family History  Problem Relation Age of Onset  . Breast cancer Maternal Aunt   . Hepatitis Daughter        C  . Lung disease Mother        lung tumor  . Heart attack Daughter 1       S/P stents  . Heart attack Sister 43  . Diabetes Neg Hx   . Stroke Neg Hx     Social History   Socioeconomic History  . Marital status: Widowed     Spouse name: Not on file  . Number of children: 3  . Years of education: Not on file  . Highest education level: Not on file  Occupational History  . 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 Financial resource strain: Not hard at all  . Food insecurity:    Worry: Never true    Inability: Never true  . Transportation needs:    Medical: No    Non-medical: No  Tobacco Use  . Smoking status: Former Smoker    Last attempt to quit: 06/04/1951    Years since quitting: 67.2  . Smokeless tobacco: Never Used  . Tobacco comment: Smoked 1945-1953 , up to 2 cigarettes/day  Substance and Sexual Activity  . Alcohol use: Yes    Alcohol/week: 7.0 standard drinks    Types: 7 Glasses of wine per week  . Drug use: No  . Sexual activity: Not Currently  Lifestyle  . Physical activity:    Days per week: 2 days    Minutes per session: 10 min  . Stress: Not at all  Relationships  . Social connections:    Talks on phone: More than three times a week    Gets together: More than three times a week    Attends religious service: More than 4 times per year    Active member of club or organization: Yes    Attends meetings of clubs or organizations: 1 to 4 times per year    Relationship status: Widowed  . Intimate partner violence:    Fear of current or ex partner: No    Emotionally abused: No    Physically abused: No    Forced sexual activity: No  Other Topics Concern  . Not on file  Social History Narrative   Has living will    Would want daughter Santiago Glad to be health care POA   Not sure about DNR--definitely doesn't want prolonged life support   Not sure about tube feeds either (but probably not)          Review of Systems: As per HPI, all others negative  Physical Exam: Vital signs in last 24 hours: Temp:  [97.9 F (36.6 C)-100.2 F (37.9 C)] 98 F (36.7 C) (03/15 0754) Pulse Rate:  [69-103] 69 (03/15 0754) Resp:   [14-18] 14 (03/15 0754) BP: (79-111)/(53-61) 87/61 (03/15 0754) SpO2:  [93 %-100 %] 97 % (03/15 0754) Weight:  [48 kg-50.8 kg] 50.8 kg (03/15 0411) Last BM Date: 08/15/18 General:   Alert, Elderly, cachectic- and frail-appearing, NAD Head:  Normocephalic and atraumatic.  Eyes:  Sclera icteric bilaterally   Conjunctiva pink. Ears:  Presbyacusis Nose:  No deformity, discharge,  or lesions. Mouth:  No deformity or lesions.  Oropharynx pink Neck:  Supple; no masses or thyromegaly. Lungs:  Clear throughout to auscultation.   No wheezes, crackles, or rhonchi. No acute distress. Heart:  Mild tachycardia, irregularly irregular; no murmurs, clicks, rubs,  or gallops. Abdomen:  Soft, mild epigastric and right upper quadrant tenderness. No masses, hepatosplenomegaly or hernias noted. Normal bowel sounds, without guarding, and without rebound.     Msk:  Symmetrical without gross deformities. Normal posture. Pulses:  Normal pulses noted. Extremities:  Without clubbing or edema. Neurologic:  Alert and  oriented x4;  grossly normal neurologically. Skin:  Intact without significant lesions or rashes. Cervical Nodes:  No significant cervical adenopathy. Psych:  Alert and cooperative. Normal mood and affect.   Lab Results: Recent Labs    08/14/18 2055 08/15/18 1413 08/15/18 2040  WBC 17.9*  18.1* 16.8* 15.0*  HGB 11.0*  11.0* 9.8* 9.6*  HCT 33.5*  33.6* 29.2* 29.9*  PLT 292  284 216 203   BMET Recent Labs    08/14/18 2055 08/15/18 2040 08/16/18 0428  NA 134* 133* 135  K 3.6 3.8 3.8  CL 103 103 106  CO2 22 21* 19*  GLUCOSE 170* 99 88  BUN 13 17 16   CREATININE 0.84 1.36* 1.17*  CALCIUM 8.7* 7.9* 8.0*   LFT Recent Labs    08/15/18 1413 08/15/18 2040  PROT 6.0* 5.6*  ALBUMIN 2.9* 2.7*  AST 242* 180*  ALT 182* 153*  ALKPHOS 138* 136*  BILITOT 3.8* 3.6*  BILIDIR 2.8*  --   IBILI 1.0*  --    PT/INR Recent Labs    08/15/18 2040  LABPROT 16.4*  INR 1.3*     Studies/Results: Dg Chest 2 View  Result Date: 08/14/2018 CLINICAL DATA:  85 year old female with mid chest pain. EXAM: CHEST - 2 VIEW COMPARISON:  CTA chest 07/05/2018 and earlier. FINDINGS: Moderate scoliosis. Stable mild cardiomegaly and tortuosity of the thoracic aorta. Other mediastinal contours are within normal limits. Visualized tracheal air column is within normal limits. No pneumothorax, pulmonary edema, pleural effusion or acute pulmonary opacity. Negative visible bowel gas pattern. IMPRESSION: Stable.  No acute cardiopulmonary abnormality. Electronically Signed   By: Genevie Ann M.D.   On: 08/14/2018 21:17   Ct Abdomen Pelvis W Contrast  Result Date: 08/15/2018 CLINICAL DATA:  Upper abdominal pain and nausea. History of gallstones and atrial fibrillation. EXAM: CT ABDOMEN AND PELVIS WITH CONTRAST TECHNIQUE: Multidetector CT imaging of the abdomen and pelvis was performed using the standard protocol following bolus administration of intravenous contrast. CONTRAST:  60mL OMNIPAQUE IOHEXOL 300 MG/ML  SOLN COMPARISON:  MRI abdomen 07/06/2018. CT abdomen and pelvis 09/16/2017 FINDINGS: Lower chest: Small left pleural effusion. Atelectasis in both lung bases. Cardiac enlargement. Small esophageal hiatal hernia. Residual contrast material in the lower esophagus may represent reflux or dysmotility. Hepatobiliary: There is prominent intra and extrahepatic bile duct dilatation, increasing since the previous studies. Small stones are demonstrated in the gallbladder. Gallbladder wall is thickened. Gallbladder is mildly distended. Given these findings, this raises suspicion for an occult common bile duct stone although no radiopaque stone is demonstrated. Pancreas: Unremarkable. No pancreatic ductal dilatation or surrounding inflammatory changes. Spleen: Normal in size without focal abnormality. Adrenals/Urinary Tract: Adrenal glands are unremarkable. Kidneys are normal, without renal calculi, focal lesion,  or hydronephrosis. Bladder is unremarkable. Stomach/Bowel: Stomach, small bowel, and colon are  not abnormally distended. Prominence of gastric rugal folds is unchanged since prior study and could represent gastritis. Scattered stool throughout the colon. Static colonic diverticula. No wall thickening or inflammatory changes. No evidence of diverticulitis. Vascular/Lymphatic: Aortic atherosclerosis. No enlarged abdominal or pelvic lymph nodes. Reproductive: Uterus and bilateral adnexa are unremarkable. Other: No free air or free fluid in the abdomen. Abdominal wall musculature appears intact. Musculoskeletal: Lumbar scoliosis convex towards the left. Degenerative changes in the lumbar spine. Old fracture deformities of the right superior and inferior pubic rami. Old compression deformity of L1 vertebra. IMPRESSION: 1. Intra and extrahepatic bile duct dilatation with gallbladder wall thickening and small stones. No radiopaque common bile duct stones are identified although changes are worrisome for an occult obstructing common bile duct stone. Consider ultrasound follow-up. 2. Small esophageal hiatal hernia with residual contrast material in the lower esophagus suggesting reflux or dysmotility. 3. Small left pleural effusion with basilar atelectasis. 4. Prominence of gastric rugal folds is unchanged since prior study and could indicate chronic gastritis. 5. Diverticulosis of the colon without evidence of diverticulitis. 6. Old fracture deformities of the right pubic rami and L1 vertebra. Lumbar scoliosis and degenerative changes. Aortic Atherosclerosis (ICD10-I70.0). Electronically Signed   By: Lucienne Capers M.D.   On: 08/15/2018 00:03   Mr 3d Recon At Scanner  Result Date: 08/15/2018 CLINICAL DATA:  Abnormal LFTs, cholelithiasis EXAM: MRI ABDOMEN WITHOUT AND WITH CONTRAST (INCLUDING MRCP) TECHNIQUE: Multiplanar multisequence MR imaging of the abdomen was performed both before and after the administration of  intravenous contrast. Heavily T2-weighted images of the biliary and pancreatic ducts were obtained, and three-dimensional MRCP images were rendered by post processing. CONTRAST:  4 mL Gadovist IV COMPARISON:  CT abdomen/pelvis dated 08/14/2018 and 09/16/2017. FINDINGS: Motion degraded images. Lower chest: Mild patchy opacities at the lung bases, likely dependent atelectasis. Hepatobiliary: Liver is within normal limits. No suspicious enhancing hepatic lesions. Layering small gallstones. Mild gallbladder distension. No pericholecystic fluid/inflammatory changes suggest acute cholecystitis. Mild intrahepatic ductal dilatation. Dilated common duct, measuring 14 mm, new from 2019. Suspected 7 mm distal CBD stone just above the ampulla, although poorly visualized/evaluated. Pancreas:  Within normal limits. Spleen:  Within normal limits. Adrenals/Urinary Tract:  Adrenal glands are within normal limits. 8 mm right upper pole renal cyst. Left kidney is within normal limits. No hydronephrosis. Stomach/Bowel: Stomach is notable for a tiny hiatal hernia. Visualized bowel is grossly unremarkable. Vascular/Lymphatic:  No evidence of abdominal aortic aneurysm. No suspicious abdominal lymphadenopathy. Other:  No abdominal ascites. Musculoskeletal: Lower thoracolumbar levoscoliosis with mild degenerative changes. IMPRESSION: Cholelithiasis, without associated inflammatory changes suggest acute cholecystitis. Intrahepatic/extrahepatic ductal dilatation. Common duct measures 14 mm. These findings are new/progressive from 2019. Suspected 7 mm distal CBD stone just above the ampulla, poorly visualized/evaluated given motion degradation. Electronically Signed   By: Julian Hy M.D.   On: 08/15/2018 04:13   Mr Abdomen Mrcp Moise Boring Contast  Result Date: 08/15/2018 CLINICAL DATA:  Abnormal LFTs, cholelithiasis EXAM: MRI ABDOMEN WITHOUT AND WITH CONTRAST (INCLUDING MRCP) TECHNIQUE: Multiplanar multisequence MR imaging of the abdomen  was performed both before and after the administration of intravenous contrast. Heavily T2-weighted images of the biliary and pancreatic ducts were obtained, and three-dimensional MRCP images were rendered by post processing. CONTRAST:  4 mL Gadovist IV COMPARISON:  CT abdomen/pelvis dated 08/14/2018 and 09/16/2017. FINDINGS: Motion degraded images. Lower chest: Mild patchy opacities at the lung bases, likely dependent atelectasis. Hepatobiliary: Liver is within normal limits. No suspicious enhancing hepatic lesions. Layering small  gallstones. Mild gallbladder distension. No pericholecystic fluid/inflammatory changes suggest acute cholecystitis. Mild intrahepatic ductal dilatation. Dilated common duct, measuring 14 mm, new from 2019. Suspected 7 mm distal CBD stone just above the ampulla, although poorly visualized/evaluated. Pancreas:  Within normal limits. Spleen:  Within normal limits. Adrenals/Urinary Tract:  Adrenal glands are within normal limits. 8 mm right upper pole renal cyst. Left kidney is within normal limits. No hydronephrosis. Stomach/Bowel: Stomach is notable for a tiny hiatal hernia. Visualized bowel is grossly unremarkable. Vascular/Lymphatic:  No evidence of abdominal aortic aneurysm. No suspicious abdominal lymphadenopathy. Other:  No abdominal ascites. Musculoskeletal: Lower thoracolumbar levoscoliosis with mild degenerative changes. IMPRESSION: Cholelithiasis, without associated inflammatory changes suggest acute cholecystitis. Intrahepatic/extrahepatic ductal dilatation. Common duct measures 14 mm. These findings are new/progressive from 2019. Suspected 7 mm distal CBD stone just above the ampulla, poorly visualized/evaluated given motion degradation. Electronically Signed   By: Julian Hy M.D.   On: 08/15/2018 04:13    Impression:  1.  Abdominal pain, improving. 2.  Elevated liver enzymes. 3.  Dilated bile duct with distal bile duct stone. 4.  Hx CHF and atrial fibrillation  (now intermittent RVR). 5.  Chronic anticoagulation, apixaban, last dose < 48 hours ago. 6.  Overall constellation of findings most consistent with cholangitis, complicated by atrial fibrillation with RVR.  Plan:  1.  Difficult situation:  Patient would benefit from ERCP, the issue is timing.  If we do the procedure today, while her HR is high and BP marginal, I would estimate at least 20-30% mortality from the anesthesia and procedure, and only CBD stent could be placed (and then she would require a second separate procedure for biliary sphincterotomy and stone extraction which would also require general anesthesia).  If we do the procedure tomorrow, we would hopefully be able to perform biliary sphincterotomy and stone extraction with goal not to have second procedure done, but this approach also could result in patient worsening from hemodynamic perspective without biliary decompression (although most patients improve with antibiotics alone prior to ERCP).  I had lengthy discussion with the hospitalists as well as her daughter Nicholaus Corolla, [phone]).  Overall, with all risks and benefits weighed and reviewed, I feel the patient would be best served by antibiotics and medical management of cholangitis today with planned ERCP tomorrow. 2.  Continue medical management (IVF, antibiotics; hold apixaban) for now. 3.  Planned ERCP tomorrow with Dr. Therisa Doyne.   LOS: 1 day   Ryliegh Mcduffey M  08/16/2018, 10:11 AM  Cell [phone] If no answer or after 5 PM call [phone]

## 2018-08-16 NOTE — Sedation Documentation (Signed)
Vital signs stable. 

## 2018-08-16 NOTE — Sedation Documentation (Signed)
Patient is resting comfortably. 

## 2018-08-16 NOTE — Progress Notes (Signed)
0700: BSSR from [employer]. Patient in bed awake, alert. Call light in reach. Bed alarm on.  1530: Shift summary: Patient HR elevated throughout shift, BP remained low. Multiple pages to attending, multiple orders received. Patient received total of 2L bolus from this RN before transfer to ICU. Report called to ICU nurse. Patient transferred to room 5. Personal belongings sent with patient. Family remains at bedside.

## 2018-08-16 NOTE — Progress Notes (Signed)
[facility] Progress Note Patient Name: Kylie May DOB: 1922-02-15 MRN: [id number]   Date of Service  08/16/2018  HPI/Events of Note  Notified of afib RVR. Patient now off norepinephrine.  eICU Interventions  Will resume coreg 3.125 BID, may need higher dose if BP permits Previously on Flecainide     Intervention Category Major Interventions: Arrhythmia - evaluation and management  Shona Needles Kylie May 08/16/2018, 9:01 PM

## 2018-08-16 NOTE — Progress Notes (Signed)
Pt currently in a-fib, HR jumping between 120-140's. MD notified. Pt currently a/o X 4, showing no signs of distress. RN to continue to monitor.

## 2018-08-16 NOTE — Progress Notes (Signed)
Went back to see patient and family.  Patient HR in 110-130s and SBP in 80s.  However, patient's leukocytosis and liver enzymes have begun to decrease.   I have discussed at length the options with family.  Over 30 minutes spent discussing options.  Patient could have ERCP for biliary stent placement today; mortality from general anesthesia for ERCP with patient in her current state is about 30%; family does not want to pursue this option today, hoping that her hemodynamics improve tomorrow (but understanding that they may not) after more fluids and antibiotics to make anesthesia risk for ERCP less.  Patient is going to be seen by PCCM for consideration of transfer to ICU.  I have asked surgery to see patient.  I will consult interventional radiology to discuss possibility of percutaneous transhepatic cholangiography (PTC) today, which may carry less anesthesia mortality risk, as transient means to help defray biliary obstruction.

## 2018-08-16 NOTE — Progress Notes (Signed)
Spoke with the family at length.  Informed that levophed is risky in a patient with her condition.  They are aware of the risk of VT/VF (that was explained) and they are willing to take the risk given alternatives.  Spoke with Dr. Pascal Lux from IR, will stabilize BP first and once more stable he will call me back in an hour and then will consent the family and proceed with drainage.  Currently discussing code status but family requested sometime to discuss code status further as I am recommending no CPR, cardioversion or central access (patient is anti-coagulated and unable to lay flat).  Will revisit in a 15 minutes with family.  After further discussion, no CPR/cardioversion but ok with short term intubation and not exceeding 10 mcg of levophed.  That part can be discussed later again if needed pending the clinical picture.  The patient is critically ill with multiple organ systems failure and requires high complexity decision making for assessment and support, frequent evaluation and titration of therapies, application of advanced monitoring technologies and extensive interpretation of multiple databases.   Critical Care Time devoted to patient care services described in this note is  45  Minutes. This time reflects time of care of this signee Dr Jennet Maduro. This critical care time does not reflect procedure time, or teaching time or supervisory time of PA/NP/Med student/Med Resident etc but could involve care discussion time.  Rush Farmer, M.D. [facility] Pulmonary/Critical Care Medicine. Pager: [phone]. After hours pager: [phone].

## 2018-08-16 NOTE — Sedation Documentation (Signed)
Pt tolerated procedure very well. 

## 2018-08-16 NOTE — Consult Note (Addendum)
Cardiology Consultation:   Patient ID: Kylie May; [id number]; 27-Aug-1921   Admit date: 08/15/2018 Date of Consult: 08/16/2018  Primary Care Provider: [name], MD Primary Cardiologist: Dr. Ida Rogue, MD  Patient Profile:   Kylie May is a 85 y.o. female with a hx of paroxysmal asymptomatic atrial fibrillation, HTN, hiatal hernia, osteopenia,  degenerative joint disease, scoliosis, hearing loss and frequent falls who is being seen today for the evaluation of sinus tachycardia thought to be SVT at the request of Dr. Starla Link.   History of Present Illness:   Kylie May is a 85 year old female with a history stated above who presented to [facility] with transfer to [facility] on 08/15/2018 with a one-month history of abdominal pain have CBD stone with probable cholangitis. She was hospitalized at an outside facility approximately 1 month ago and was worked up for bile duct stone and elevated liver enzymes however, she was thought to have passed the stone and no intervention was recommended at that time. Daughter is at bedside to help with HPI given the patients HOH. Several days prior to presentation to [facility], she stated that her symptoms recurred with epigastric and right upper quadrant abdominal pain with associated jaundice and fevers.  Internal medicine service admitted with GI consultation.  ERCP was recommended however there is no availability for ERCP over the weekend per GI note reviewed.  Her MRCP results were reviewed and given the patient's clinical condition including her age, leukocytosis, low-grade fever she was transferred to [facility] She  was started on IV antibiotics. ERCP now planned for Monday given 72 hours off Eliquis with adequate antibiotic coverage.  Kylie May has a prior history of atrial fibrillation on Eliquis with last dose of Eliquis 08/14/2018.  She is followed in the outpatient setting by Dr. Ida Rogue, her primary  cardiologist.  Per chart review at some point she was on amiodarone however had an increase in her TSH with a peak of 19 and therefore her amiodarone was held and flecainide was initiated.  Last echocardiogram 06/09/2014 showed normal LV function with severe MR, moderate to severe TR.  Cardiology has been asked to evaluate given the above for further recommendations  Past Medical History:  Diagnosis Date   A-fib [facility])    Acid reflux disease    Arrhythmia    Atrial fibrillation with RVR ([place]) 08/15/2018   Chronic diastolic CHF (congestive heart failure) (HCC)    Compression fracture of lumbar vertebra (HCC)    Diverticulitis 01-2008   GI [place]   Diverticulosis    Esophageal stricture 2006   Femoral bruit    Right   Fibrocystic breast disease    Hiatal hernia    Hypercholesterolemia    [place] study LDL goal = < 160   Mitral regurgitation    a. 06/2014 EF 55-60%, elevated end-diastolic pressures, dilated LA at 4.3 cm, mildly dilated RA, severe mitral regurgitation, mild aortic sclerosis without stenosis, mod-severe TR   Mitral valve prolapse    Osteoporosis    Dr Matthew Saras   Pancreatitis 07-2008   Hospitalized    Schatzki's ring    Sepsis ([place]) 08/15/2018   Skin cancer    facial x 2. [name]    Past Surgical History:  Procedure Laterality Date   COLONOSCOPY  2006   Epidural steroids     x1   ESOPHAGEAL DILATION  2006   G3 P4 (twins)     SKIN CANCER EXCISION  Prior to Admission medications   Medication Sig Start Date End Date Taking? Authorizing Provider  carvedilol (COREG) 3.125 MG tablet Take 1 tablet (3.125 mg total) by mouth 2 (two) times daily with a meal. 08/04/18   Gollan, Kathlene November, MD  flecainide (TAMBOCOR) 50 MG tablet Take 1 tablet (50 mg total) by mouth 2 (two) times daily. 08/04/18   Minna Merritts, MD  magnesium chloride (SLOW-MAG) 64 MG TBEC SR tablet Take 2 tablets (128 mg total) by mouth daily. 12/18/17   [name],  MD  omeprazole (PRILOSEC) 40 MG capsule Take 1 capsule (40 mg total) by mouth daily. 05/13/18 08/15/18  [name], MD  rosuvastatin (CRESTOR) 20 MG tablet Take 1 tablet (20 mg total) by mouth daily. 08/04/18   Minna Merritts, MD  triamcinolone ointment (KENALOG) 0.1 %  07/29/18   [provider]    Inpatient Medications: Scheduled Meds:  Continuous Infusions:  sodium chloride 100 mL/hr at 08/16/18 1033   piperacillin-tazobactam (ZOSYN)  IV 3.375 g (08/16/18 0528)   PRN Meds: diphenhydrAMINE, ketorolac, morphine injection  Allergies:    Allergies  Allergen Reactions   Metronidazole     Other reaction(s): Dizziness and giddiness (finding), Other (qualifier value) made things feel like they were vibrating like the bed and floor,also caused mild dizziness weakness   Penicillin G Rash    Social History:   Social History   Socioeconomic History   Marital status: Widowed    Spouse name: Not on file   Number of children: 3   Years of education: Not on file   Highest education level: Not on file  Occupational History   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  Social Designer, fashion/clothing strain: Not hard at [employer] insecurity:    Worry: Never true    Inability: Never true   Transportation needs:    Medical: No    Non-medical: No  Tobacco Use   Smoking status: Former Smoker    Last attempt to quit: 06/04/1951    Years since quitting: 67.2   Smokeless tobacco: Never Used   Tobacco comment: Smoked 1945-1953 , up to 2 cigarettes/day  Substance and Sexual Activity   Alcohol use: Yes    Alcohol/week: 7.0 standard drinks    Types: 7 Glasses of wine per week   Drug use: No   Sexual activity: Not Currently  Lifestyle   Physical activity:    Days per week: 2 days    Minutes per session: 10 min   Stress: Not at all  Relationships   Social connections:    Talks on phone: More  than three times a week    Gets together: More than three times a week    Attends religious service: More than 4 times per year    Active member of club or organization: Yes    Attends meetings of clubs or organizations: 1 to 4 times per year    Relationship status: Widowed   Intimate partner violence:    Fear of current or ex partner: No    Emotionally abused: No    Physically abused: No    Forced sexual activity: No  Other Topics Concern   Not on file  Social History Narrative   Has living will    Would want daughter Santiago Glad to be health care POA   Not sure about DNR--definitely doesn't want prolonged life support   Not sure about  tube feeds either (but probably not)          Family History:   Family History  Problem Relation Age of Onset   Breast cancer Maternal Aunt    Hepatitis Daughter        C   Lung disease Mother        lung tumor   Heart attack Daughter 21       S/P stents   Heart attack Sister 31   Diabetes Neg Hx    Stroke Neg Hx    Family Status:  Family Status  Relation Name Status   Mat Aunt  (Not Specified)   Daughter  (Not Specified)   Mother  Deceased   Daughter  (Not Specified)   Sister  (Not Specified)   Father  Deceased   Brother  Deceased   Neg Hx  (Not Specified)    ROS:  Please see the history of present illness.  All other ROS reviewed and negative.     Physical Exam/Data:   Vitals:   08/16/18 0007 08/16/18 0411 08/16/18 0754 08/16/18 1220  BP: 96/60 111/60 (!) 87/61 (!) 89/63  Pulse: 80 88 69 (!) 139  Resp: 18 18 14 13   Temp: 98.3 F (36.8 C) 98.2 F (36.8 C) 98 F (36.7 C) 98 F (36.7 C)  TempSrc: Oral Oral Oral Oral  SpO2: 95% 97% 97% 98%  Weight:  50.8 kg    Height:        Intake/Output Summary (Last 24 hours) at 08/16/2018 1224 Last data filed at 08/16/2018 1033 Gross per 24 hour  Intake 1593.75 ml  Output 350 ml  Net 1243.75 ml   Filed Weights   08/15/18 2102 08/16/18 0411  Weight: 48 kg 50.8 kg    Body mass index is 24.23 kg/m.   General: Elderly, frail, NAD Skin: Warm, dry, intact  Head: Normocephalic, atraumatic,  clear, moist mucus membranes. Neck: Negative for carotid bruits. No JVD Lungs:Clear to ausculation bilaterally. No wheezes, rales, or rhonchi. Breathing is unlabored. Cardiovascular: RRR with S1 S2. + murmur. No rubs, gallops, or LV heave appreciated. Abdomen: Soft, non-tender, non-distended with normoactive bowel sounds. No obvious abdominal masses. MSK: Strength and tone appear normal for age. 5/5 in all extremities Extremities: No edema. No clubbing or cyanosis. DP/PT pulses 2+ bilaterally Neuro: Alert and oriented. No focal deficits. No facial asymmetry. MAE spontaneously. Psych: Responds to questions appropriately with normal affect.     EKG:  The EKG was personally reviewed and demonstrates: 08/15/2018 sinus tachycardia.  Will obtain more current EKG Telemetry:  Telemetry was personally reviewed and demonstrates: 08/16/2018 sinus tachycardia with rates ranging from 120-130s  Relevant CV Studies:  ECHO: Pending  CATH: None  Laboratory Data:  Chemistry Recent Labs  Lab 08/14/18 2055 08/15/18 2040 08/16/18 0428  NA 134* 133* 135  K 3.6 3.8 3.8  CL 103 103 106  CO2 22 21* 19*  GLUCOSE 170* 99 88  BUN 13 17 16   CREATININE 0.84 1.36* 1.17*  CALCIUM 8.7* 7.9* 8.0*  GFRNONAA 59* 33* 40*  GFRAA >60 39* 46*  ANIONGAP 9 9 10     Total Protein  Date Value Ref Range Status  08/16/2018 5.2 (L) 6.5 - 8.1 g/dL Final  09/16/2014 6.1 (L) g/dL Final    Comment:    6.5-8.1 NOTE: New Reference Range  08/09/14    Albumin  Date Value Ref Range Status  08/16/2018 2.5 (L) 3.5 - 5.0 g/dL Final  09/16/2014 3.6 g/dL Final  Comment:    3.5-5.0 NOTE: New reference range  08/09/14    AST  Date Value Ref Range Status  08/16/2018 106 (H) 15 - 41 U/L Final   SGOT(AST)  Date Value Ref Range Status  09/16/2014 20 U/L Final    Comment:    15-41 NOTE:  New Reference Range  08/09/14    ALT  Date Value Ref Range Status  08/16/2018 117 (H) 0 - 44 U/L Final   SGPT (ALT)  Date Value Ref Range Status  09/16/2014 15 U/L Final    Comment:    14-54 NOTE: New Reference Range  08/09/14    Alkaline Phosphatase  Date Value Ref Range Status  08/16/2018 128 (H) 38 - 126 U/L Final  09/16/2014 37 (L) U/L Final    Comment:    38-126 NOTE: New Reference Range  08/09/14    Total Bilirubin  Date Value Ref Range Status  08/16/2018 2.6 (H) 0.3 - 1.2 mg/dL Final   Bilirubin,Total  Date Value Ref Range Status  09/16/2014 0.6 mg/dL Final    Comment:    0.3-1.2 NOTE: New Reference Range  08/09/14    Hematology Recent Labs  Lab 08/15/18 1413 08/15/18 2040 08/16/18 0938  WBC 16.8* 15.0* 14.8*  RBC 3.37* 3.40* 3.60*  HGB 9.8* 9.6* 10.0*  HCT 29.2* 29.9* 31.2*  MCV 86.6 87.9 86.7  MCH 29.1 28.2 27.8  MCHC 33.6 32.1 32.1  RDW 13.0 13.1 13.2  PLT 216 203 204   Cardiac Enzymes Recent Labs  Lab 08/14/18 2055  TROPONINI <0.03   No results for input(s): TROPIPOC in the last 168 hours.  BNP Recent Labs  Lab 08/15/18 2040  BNP 1,005.7*    DDimer No results for input(s): DDIMER in the last 168 hours. TSH:  Lab Results  Component Value Date   TSH 9.124 (H) 08/14/2018   Lipids: Lab Results  Component Value Date   CHOL 173 01/14/2017   HDL 67.30 01/14/2017   LDLCALC 81 01/14/2017   LDLDIRECT 121.4 06/17/2011   TRIG 121.0 01/14/2017   CHOLHDL 3 01/14/2017   HgbA1c:No results found for: HGBA1C  Radiology/Studies:  Dg Chest 2 View  Result Date: 08/14/2018 CLINICAL DATA:  85 year old female with mid chest pain. EXAM: CHEST - 2 VIEW COMPARISON:  CTA chest 07/05/2018 and earlier. FINDINGS: Moderate scoliosis. Stable mild cardiomegaly and tortuosity of the thoracic aorta. Other mediastinal contours are within normal limits. Visualized tracheal air column is within normal limits. No pneumothorax, pulmonary edema, pleural  effusion or acute pulmonary opacity. Negative visible bowel gas pattern. IMPRESSION: Stable.  No acute cardiopulmonary abnormality. Electronically Signed   By: Genevie Ann M.D.   On: 08/14/2018 21:17   Ct Abdomen Pelvis W Contrast  Result Date: 08/15/2018 CLINICAL DATA:  Upper abdominal pain and nausea. History of gallstones and atrial fibrillation. EXAM: CT ABDOMEN AND PELVIS WITH CONTRAST TECHNIQUE: Multidetector CT imaging of the abdomen and pelvis was performed using the standard protocol following bolus administration of intravenous contrast. CONTRAST:  24mL OMNIPAQUE IOHEXOL 300 MG/ML  SOLN COMPARISON:  MRI abdomen 07/06/2018. CT abdomen and pelvis 09/16/2017 FINDINGS: Lower chest: Small left pleural effusion. Atelectasis in both lung bases. Cardiac enlargement. Small esophageal hiatal hernia. Residual contrast material in the lower esophagus may represent reflux or dysmotility. Hepatobiliary: There is prominent intra and extrahepatic bile duct dilatation, increasing since the previous studies. Small stones are demonstrated in the gallbladder. Gallbladder wall is thickened. Gallbladder is mildly distended. Given these findings, this raises suspicion for an  occult common bile duct stone although no radiopaque stone is demonstrated. Pancreas: Unremarkable. No pancreatic ductal dilatation or surrounding inflammatory changes. Spleen: Normal in size without focal abnormality. Adrenals/Urinary Tract: Adrenal glands are unremarkable. Kidneys are normal, without renal calculi, focal lesion, or hydronephrosis. Bladder is unremarkable. Stomach/Bowel: Stomach, small bowel, and colon are not abnormally distended. Prominence of gastric rugal folds is unchanged since prior study and could represent gastritis. Scattered stool throughout the colon. Static colonic diverticula. No wall thickening or inflammatory changes. No evidence of diverticulitis. Vascular/Lymphatic: Aortic atherosclerosis. No enlarged abdominal or pelvic  lymph nodes. Reproductive: Uterus and bilateral adnexa are unremarkable. Other: No free air or free fluid in the abdomen. Abdominal wall musculature appears intact. Musculoskeletal: Lumbar scoliosis convex towards the left. Degenerative changes in the lumbar spine. Old fracture deformities of the right superior and inferior pubic rami. Old compression deformity of L1 vertebra. IMPRESSION: 1. Intra and extrahepatic bile duct dilatation with gallbladder wall thickening and small stones. No radiopaque common bile duct stones are identified although changes are worrisome for an occult obstructing common bile duct stone. Consider ultrasound follow-up. 2. Small esophageal hiatal hernia with residual contrast material in the lower esophagus suggesting reflux or dysmotility. 3. Small left pleural effusion with basilar atelectasis. 4. Prominence of gastric rugal folds is unchanged since prior study and could indicate chronic gastritis. 5. Diverticulosis of the colon without evidence of diverticulitis. 6. Old fracture deformities of the right pubic rami and L1 vertebra. Lumbar scoliosis and degenerative changes. Aortic Atherosclerosis (ICD10-I70.0). Electronically Signed   By: Lucienne Capers M.D.   On: 08/15/2018 00:03   Mr 3d Recon At Scanner  Result Date: 08/15/2018 CLINICAL DATA:  Abnormal LFTs, cholelithiasis EXAM: MRI ABDOMEN WITHOUT AND WITH CONTRAST (INCLUDING MRCP) TECHNIQUE: Multiplanar multisequence MR imaging of the abdomen was performed both before and after the administration of intravenous contrast. Heavily T2-weighted images of the biliary and pancreatic ducts were obtained, and three-dimensional MRCP images were rendered by post processing. CONTRAST:  4 mL Gadovist IV COMPARISON:  CT abdomen/pelvis dated 08/14/2018 and 09/16/2017. FINDINGS: Motion degraded images. Lower chest: Mild patchy opacities at the lung bases, likely dependent atelectasis. Hepatobiliary: Liver is within normal limits. No  suspicious enhancing hepatic lesions. Layering small gallstones. Mild gallbladder distension. No pericholecystic fluid/inflammatory changes suggest acute cholecystitis. Mild intrahepatic ductal dilatation. Dilated common duct, measuring 14 mm, new from 2019. Suspected 7 mm distal CBD stone just above the ampulla, although poorly visualized/evaluated. Pancreas:  Within normal limits. Spleen:  Within normal limits. Adrenals/Urinary Tract:  Adrenal glands are within normal limits. 8 mm right upper pole renal cyst. Left kidney is within normal limits. No hydronephrosis. Stomach/Bowel: Stomach is notable for a tiny hiatal hernia. Visualized bowel is grossly unremarkable. Vascular/Lymphatic:  No evidence of abdominal aortic aneurysm. No suspicious abdominal lymphadenopathy. Other:  No abdominal ascites. Musculoskeletal: Lower thoracolumbar levoscoliosis with mild degenerative changes. IMPRESSION: Cholelithiasis, without associated inflammatory changes suggest acute cholecystitis. Intrahepatic/extrahepatic ductal dilatation. Common duct measures 14 mm. These findings are new/progressive from 2019. Suspected 7 mm distal CBD stone just above the ampulla, poorly visualized/evaluated given motion degradation. Electronically Signed   By: Julian Hy M.D.   On: 08/15/2018 04:13   Mr Abdomen Mrcp Moise Boring Contast  Result Date: 08/15/2018 CLINICAL DATA:  Abnormal LFTs, cholelithiasis EXAM: MRI ABDOMEN WITHOUT AND WITH CONTRAST (INCLUDING MRCP) TECHNIQUE: Multiplanar multisequence MR imaging of the abdomen was performed both before and after the administration of intravenous contrast. Heavily T2-weighted images of the biliary and pancreatic ducts  were obtained, and three-dimensional MRCP images were rendered by post processing. CONTRAST:  4 mL Gadovist IV COMPARISON:  CT abdomen/pelvis dated 08/14/2018 and 09/16/2017. FINDINGS: Motion degraded images. Lower chest: Mild patchy opacities at the lung bases, likely dependent  atelectasis. Hepatobiliary: Liver is within normal limits. No suspicious enhancing hepatic lesions. Layering small gallstones. Mild gallbladder distension. No pericholecystic fluid/inflammatory changes suggest acute cholecystitis. Mild intrahepatic ductal dilatation. Dilated common duct, measuring 14 mm, new from 2019. Suspected 7 mm distal CBD stone just above the ampulla, although poorly visualized/evaluated. Pancreas:  Within normal limits. Spleen:  Within normal limits. Adrenals/Urinary Tract:  Adrenal glands are within normal limits. 8 mm right upper pole renal cyst. Left kidney is within normal limits. No hydronephrosis. Stomach/Bowel: Stomach is notable for a tiny hiatal hernia. Visualized bowel is grossly unremarkable. Vascular/Lymphatic:  No evidence of abdominal aortic aneurysm. No suspicious abdominal lymphadenopathy. Other:  No abdominal ascites. Musculoskeletal: Lower thoracolumbar levoscoliosis with mild degenerative changes. IMPRESSION: Cholelithiasis, without associated inflammatory changes suggest acute cholecystitis. Intrahepatic/extrahepatic ductal dilatation. Common duct measures 14 mm. These findings are new/progressive from 2019. Suspected 7 mm distal CBD stone just above the ampulla, poorly visualized/evaluated given motion degradation. Electronically Signed   By: Julian Hy M.D.   On: 08/15/2018 04:13   Assessment and Plan:   1.  Sinus tachycardia with prior history of paroxysmal atrial fibrillation: -Cardiology asked to evaluate for possible SVT however on telemetry review, it appears patient is in sinus tachycardia with rates in the 120-130 range.  Reviewed with attending EP cardiologist -She has a prior history of paroxysmal atrial fibrillation and has been well controlled on low-dose carvedilol as well as low-dose Eliquis -Currently not on any beta blocking agents secondary to moderate hypotension in the setting of acute illness -It may be that she needs pressor support  during her acute illness in order to give her adequate beta-blocking agents for heart rate control -Likely tachycardia in the setting of acute illness and will hopefully subside once recovered from cholangitis, sepsis>> which is rest IV fluid hydration for now and once BP more stable would add beta-blocking agent -Will obtain more current EKG  2.  Common bile duct stone with probable cholangitis: -Per primary team, GI service -Plan for ERCP scheduled for tomorrow 08/17/2018 -Eliquis currently on hold per GI team -We will need to restart as soon as possible post procedure given history of PAF  3.  Sepsis: -Patient presented with significant abdominal pain with known bile duct stone and probable cholangitis -Started on IV antibiotic therapy and IV fluid hydration -Per primary team  4.  PAF: -Found to have new onset atrial fibrillation during a hospitalization at [facility] 06/09/2014-06/12/2014 along with mild diastolic CHF -Has been maintained on low-dose carvedilol 3.125 mg, flecainide 50 mg twice daily as well as low-dose Eliquis therapies  5.  Chronic diastolic CHF with severe MR and moderate to severe TR: -Per last echocardiogram 2016 shows normal LV function, severely dilated left atrium, severe MR, moderate to severe TR -Appears euvolemic on exam -No diuretics needed at this time   For questions or updates, please contact [facility] Please consult [email or web address] for contact info under Cardiology/STEMI.   Lyndel Safe NP-C [facility] Pager: [phone] 08/16/2018 12:24 PM  Cholangitis-presumed with known choledocholithiasis  Hypotension/tachycardia concerning for sepsis  Atrial fibrillation-paroxysmal  Chronic diastolic heart failure echo report as noted above shows moderate-severe MR/TR and severe LAE.  Hypothyroidism-mild  As noted above, she is warm to touch consistent with a sepsis-like shock  as opposed to a hypovolemic shock syndrome associated with third spacing  for example  ECG pending per telemetry consistent with sinus tachycardia  Concerned regarding the hemodynamics in this very elderly lady with presumed gallbladder sepsis.  Have spoken with the hospitalist service; they will recheck to CCM regarding possible sepsis management.  Have also discussed with the hospitalist as well as Dr. Paulita Fujita the potential role for cholecystostomy given her unstable situation which precludes either surgery or ERCP currently.  We are available for supportive care.

## 2018-08-16 NOTE — Progress Notes (Signed)
NAME:  Kylie May, MRN:  [id number], DOB:  Oct 15, 1921, LOS: 1 ADMISSION DATE:  08/15/2018, CONSULTATION DATE:  08/16/2018 REFERRING MD:  Nile Riggs Starla Link, CHIEF COMPLAINT:  Septic shock   Brief History   85 year old female with PMH below who present with a common bile duct stone resulting in septic shock.  GI was consulted but does not feel comfortable taking her to endoscopy due to cardiopulmonary instability.  Surgery was called and patient is too unstable for surgery.  PCCM was consulted to assist with vital stabilization prior to consideration of IR drainage.  Patient is hard of hearing and it is difficult to get history of her.  Patient was transferred from [facility] to [facility] for GI to address the stone.  Patient is currently hypotensive (80's after 2 liter of fluid) and with a-fib RVR (130-140).  History of present illness   85 year old female with PMH below who present with a common bile duct stone resulting in septic shock.  GI was consulted but does not feel comfortable taking her to endoscopy due to cardiopulmonary instability.  Surgery was called and patient is too unstable for surgery.  PCCM was consulted to assist with vital stabilization prior to consideration of IR drainage.  Patient is hard of hearing and it is difficult to get history of her.  Patient was transferred from [facility] to [facility] for GI to address the stone.  Patient is currently hypotensive (80's after 2 liter of fluid) and with a-fib RVR (130-140).  Past Medical History  A-fib with RVR  Significant Hospital Events   3/15 transfer to the ICU for evolving septic shock  Consults:  GI CCS PCCM Cardiology  Procedures:  None  Significant Diagnostic Tests:  MRI of the abdomen with obstructing common bile duct stone  Micro Data:  Blood 3/15>>>  Antimicrobials:  Zosyn 3/15>>>   Interim history/subjective:  Hypotensive and in a-fib with RVR  Objective   Blood pressure 94/61, pulse (!) 131, temperature 98 F (36.7 C),  temperature source Axillary, resp. rate (!) 21, height 4' 9.01" (1.448 m), weight 50.8 kg, SpO2 99 %.        Intake/Output Summary (Last 24 hours) at 08/16/2018 1357 Last data filed at 08/16/2018 1033 Gross per 24 hour  Intake 1593.75 ml  Output 350 ml  Net 1243.75 ml   Filed Weights   08/15/18 2102 08/16/18 0411  Weight: 48 kg 50.8 kg    Examination: General: Well appearing, NAD HENT: [place]/AT, PERRL, EOM-I and MMM Lungs: CTA bilaterally Cardiovascular: RRR, Nl S1/S2 and -M/R/G Abdomen: Soft, NT, ND and +BS Extremities: -edema and -tenderness Neuro: Alert and interactive, moving all ext to command but hard of hearing Skin: Thin but intact  Resolved Hospital Problem list   N/A  Assessment & Plan:  85 year old female with PMH of a-fib with RVR who presents with a common bile duct obstructing stone and likely cholecystitis.  PCCM consulted for septic shock and a-fib with RVR.  I reviewed CXR myself, no acute disease noted.  Discussed with PCCM-NP.  Septic shock:  - Family discussing pressors  - IVF resuscitation  - Will recommend low dose peripheral pressors only but family is insistent on full code status  - Transfer to the ICU  - Try to avoid central access  CBD Stone:  - If able to stabilize vital signs then will need an IR drain  - GI and surgery following  GOC: family insistent on full code status  Labs   CBC: Recent Labs  Lab 08/14/18 2055 08/15/18 1413 08/15/18 2040 08/16/18 0938  WBC 17.9*  18.1* 16.8* 15.0* 14.8*  NEUTROABS 15.6*  --  12.9* 13.0*  HGB 11.0*  11.0* 9.8* 9.6* 10.0*  HCT 33.5*  33.6* 29.2* 29.9* 31.2*  MCV 88.9  88.9 86.6 87.9 86.7  PLT 292  284 216 203 664   Basic Metabolic Panel: Recent Labs  Lab 08/14/18 2055 08/15/18 2040 08/16/18 0428  NA 134* 133* 135  K 3.6 3.8 3.8  CL 103 103 106  CO2 22 21* 19*  GLUCOSE 170* 99 88  BUN 13 17 16   CREATININE 0.84 1.36* 1.17*  CALCIUM 8.7* 7.9* 8.0*  MG  --  1.1* 2.0   GFR:  Estimated Creatinine Clearance: 20.2 mL/min (A) (by C-G formula based on SCr of 1.17 mg/dL (H)). Recent Labs  Lab 08/14/18 2055 08/15/18 1413 08/15/18 2040 08/15/18 2358 08/16/18 0429 08/16/18 0938  WBC 17.9*  18.1* 16.8* 15.0*  --   --  14.8*  LATICACIDVEN  --   --   --  1.0 0.9  --    Liver Function Tests: Recent Labs  Lab 08/14/18 2055 08/15/18 1413 08/15/18 2040 08/16/18 0938  AST 407* 242* 180* 106*  ALT 150* 182* 153* 117*  ALKPHOS 153* 138* 136* 128*  BILITOT 1.5* 3.8* 3.6* 2.6*  PROT 6.7 6.0* 5.6* 5.2*  ALBUMIN 3.6 2.9* 2.7* 2.5*   Recent Labs  Lab 08/14/18 2055  LIPASE 107*   No results for input(s): AMMONIA in the last 168 hours.  ABG No results found for: PHART, PCO2ART, PO2ART, HCO3, TCO2, ACIDBASEDEF, O2SAT   Coagulation Profile: Recent Labs  Lab 08/15/18 2040  INR 1.3*   Cardiac Enzymes: Recent Labs  Lab 08/14/18 2055  TROPONINI <0.03   HbA1C: No results found for: HGBA1C  CBG: Recent Labs  Lab 08/15/18 2203 08/16/18 0413  GLUCAP 99 82   Review of Systems:   Unattainable, patient is difficult to communicate with due to her being hard of hearing  Past Medical History  She,  has a past medical history of A-fib ([place]), Acid reflux disease, Arrhythmia, Atrial fibrillation with RVR ([place]) (08/15/2018), Chronic diastolic CHF (congestive heart failure) ([place]), Compression fracture of lumbar vertebra ([place]), Diverticulitis (01-2008), Diverticulosis, Esophageal stricture (2006), Femoral bruit, Fibrocystic breast disease, Hiatal hernia, Hypercholesterolemia, Mitral regurgitation, Mitral valve prolapse, Osteoporosis, Pancreatitis (07-2008), Schatzki's ring, Sepsis ([place]) (08/15/2018), and Skin cancer.   Surgical History    Past Surgical History:  Procedure Laterality Date  . COLONOSCOPY  2006  . Epidural steroids     x1  . ESOPHAGEAL DILATION  2006  . G3 P4 (twins)    . SKIN CANCER EXCISION       Social History   reports that she quit smoking  about 67 years ago. She has never used smokeless tobacco. She reports current alcohol use of about 7.0 standard drinks of alcohol per week. She reports that she does not use drugs.   Family History   Her family history includes Breast cancer in her maternal aunt; Heart attack (age of onset: 64) in her daughter; Heart attack (age of onset: 58) in her sister; Hepatitis in her daughter; Lung disease in her mother. There is no history of Diabetes or Stroke.   Allergies Allergies  Allergen Reactions  . Metronidazole     Other reaction(s): Dizziness and giddiness (finding), Other (qualifier value) made things feel like they were vibrating like the bed and floor,also caused mild  dizziness weakness  . Penicillin G Rash     Home Medications  Prior to Admission medications   Medication Sig Start Date End Date Taking? Authorizing Provider  carvedilol (COREG) 3.125 MG tablet Take 1 tablet (3.125 mg total) by mouth 2 (two) times daily with a meal. 08/04/18   Gollan, Kathlene November, MD  flecainide (TAMBOCOR) 50 MG tablet Take 1 tablet (50 mg total) by mouth 2 (two) times daily. 08/04/18   Minna Merritts, MD  magnesium chloride (SLOW-MAG) 64 MG TBEC SR tablet Take 2 tablets (128 mg total) by mouth daily. 12/18/17   [name], MD  omeprazole (PRILOSEC) 40 MG capsule Take 1 capsule (40 mg total) by mouth daily. 05/13/18 08/15/18  [name], MD  rosuvastatin (CRESTOR) 20 MG tablet Take 1 tablet (20 mg total) by mouth daily. 08/04/18   Minna Merritts, MD  triamcinolone ointment (KENALOG) 0.1 %  07/29/18   [provider]    The patient is critically ill with multiple organ systems failure and requires high complexity decision making for assessment and support, frequent evaluation and titration of therapies, application of advanced monitoring technologies and extensive interpretation of multiple databases.   Critical Care Time devoted to patient care services described in this note is  45   Minutes. This time reflects time of care of this signee Dr Jennet Maduro. This critical care time does not reflect procedure time, or teaching time or supervisory time of PA/NP/Med student/Med Resident etc but could involve care discussion time.  Rush Farmer, M.D. [facility] Pulmonary/Critical Care Medicine. Pager: [phone]. After hours pager: [phone].

## 2018-08-16 NOTE — Sedation Documentation (Signed)
Pt is resting at this time, vss, no complaints. Procedure started.

## 2018-08-16 NOTE — Progress Notes (Signed)
Attempted echo. Pt HR too high

## 2018-08-16 NOTE — Plan of Care (Signed)
  Problem: Clinical Measurements: Goal: Will remain free from infection Outcome: Progressing Goal: Diagnostic test results will improve Outcome: Progressing   Problem: Pain Managment: Goal: General experience of comfort will improve Outcome: Progressing   Problem: Safety: Goal: Ability to remain free from injury will improve Outcome: Progressing   Problem: Skin Integrity: Goal: Risk for impaired skin integrity will decrease Outcome: Progressing   

## 2018-08-16 NOTE — Progress Notes (Signed)
Patient ID: Kylie May, female   DOB: 04-26-1922, 85 y.o.   MRN: [id number]  PROGRESS NOTE    MERLINA MARCHENA  QQI:[id number] DOB: [birth year] DOA: 08/15/2018 PCP: [name], MD   Brief Narrative:  85 year old female with history of paroxysmal A. fib, chronic diastolic CHF, mitral valve prolapse, hyperlipidemia, diverticulitis, esophageal stricture, Schatzki's ring, fibrocystic breast disease was transferred from [facility] on 08/15/2018 for abdominal pain secondary to CBD stone with probable cholangitis.  Antibiotics were started.  GI was consulted.  Assessment & Plan:   Active Problems:   Hyperlipidemia   Mitral regurgitation   Chronic diastolic CHF (congestive heart failure) (HCC)   Transaminitis   Choledocholithiasis with acute cholecystitis   Atrial fibrillation with RVR (HCC)   Sepsis (HCC)  Probable choledocholithiasis with probable cholangitis and ? cholecystitis causing transaminitis -GI has been consulted.  Continue Zosyn.  Continue n.p.o.  Will need to involve general surgery as well once ERCP is done.  Monitor LFTs.  Sepsis: Present on admission -Antibiotics as above.  Blood pressure on the lower side this morning.  Will give 500 cc IV bolus.  Increase normal saline 200 cc an hour afterwards.  Follow cultures.  Leukocytosis -Slightly improving.  Monitor  chronic diastolic CHF: As per cardiology note from 12/18/2017 -Family unaware of this diagnosis.  Strict input and output.  Daily weight.  Echo pending.  Currently no signs of fluid overload  Paroxysmal A. Fib -Eliquis on hold.  Has intermittent tachycardia.  Monitor heart rate.    DVT prophylaxis: SCDs Code Status: Full Family Communication: Grandson at bedside Disposition Plan: Depends on clinical outcome  Consultants: GI  Procedures: None  Antimicrobials: Zosyn from 08/14/2018 onwards   Subjective: Patient seen and examined at bedside.  She is very hard of  hearing.  Complains of abdominal pain.  No overnight fever or vomiting reported.  Objective: Vitals:   08/15/18 2102 08/16/18 0007 08/16/18 0411 08/16/18 0754  BP: (!) 81/57 96/60 111/60 (!) 87/61  Pulse:  80 88 69  Resp:  18 18 14   Temp:  98.3 F (36.8 C) 98.2 F (36.8 C) 98 F (36.7 C)  TempSrc:  Oral Oral Oral  SpO2:  95% 97% 97%  Weight: 48 kg  50.8 kg   Height: 4' 9.01" (1.448 m)       Intake/Output Summary (Last 24 hours) at 08/16/2018 1057 Last data filed at 08/16/2018 1033 Gross per 24 hour  Intake 1593.75 ml  Output 350 ml  Net 1243.75 ml   Filed Weights   08/15/18 2102 08/16/18 0411  Weight: 48 kg 50.8 kg    Examination:  General exam: Elderly female lying in bed.  Extremely hard of hearing.  Awake, no distress.   Respiratory system: Bilateral decreased breath sounds at bases with scattered crackles Cardiovascular system: S1 & S2 heard, intermittently tachycardic Gastrointestinal system: Abdomen is nondistended, soft and mildly tender in the right upper quadrant.  Normal bowel sounds heard. Extremities: No cyanosis, clubbing, edema  Central nervous system: Alert and oriented.  Extremely hard of hearing.  No focal neurological deficits. Moving extremities Skin: No rashes, lesions or ulcers Psychiatry: Looks anxious.    Data Reviewed: I have personally reviewed following labs and imaging studies  CBC: Recent Labs  Lab 08/14/18 2055 08/15/18 1413 08/15/18 2040 08/16/18 0938  WBC 17.9*   18.1* 16.8* 15.0* 14.8*  NEUTROABS 15.6*  --  12.9* 13.0*  HGB 11.0*   11.0* 9.8* 9.6* 10.0*  HCT  33.5*   33.6* 29.2* 29.9* 31.2*  MCV 88.9   88.9 86.6 87.9 86.7  PLT 292   284 216 203 409   Basic Metabolic Panel: Recent Labs  Lab 08/14/18 2055 08/15/18 2040 08/16/18 0428  NA 134* 133* 135  K 3.6 3.8 3.8  CL 103 103 106  CO2 22 21* 19*  GLUCOSE 170* 99 88  BUN 13 17 16   CREATININE 0.84 1.36* 1.17*  CALCIUM 8.7* 7.9* 8.0*  MG  --  1.1* 2.0    GFR: Estimated Creatinine Clearance: 20.2 mL/min (A) (by C-G formula based on SCr of 1.17 mg/dL (H)). Liver Function Tests: Recent Labs  Lab 08/14/18 2055 08/15/18 1413 08/15/18 2040 08/16/18 0938  AST 407* 242* 180* 106*  ALT 150* 182* 153* 117*  ALKPHOS 153* 138* 136* 128*  BILITOT 1.5* 3.8* 3.6* 2.6*  PROT 6.7 6.0* 5.6* 5.2*  ALBUMIN 3.6 2.9* 2.7* 2.5*   Recent Labs  Lab 08/14/18 2055  LIPASE 107*   No results for input(s): AMMONIA in the last 168 hours. Coagulation Profile: Recent Labs  Lab 08/15/18 2040  INR 1.3*   Cardiac Enzymes: Recent Labs  Lab 08/14/18 2055  TROPONINI <0.03   BNP (last 3 results) No results for input(s): PROBNP in the last 8760 hours. HbA1C: No results for input(s): HGBA1C in the last 72 hours. CBG: Recent Labs  Lab 08/15/18 2203 08/16/18 0413  GLUCAP 99 82   Lipid Profile: No results for input(s): CHOL, HDL, LDLCALC, TRIG, CHOLHDL, LDLDIRECT in the last 72 hours. Thyroid Function Tests: Recent Labs    08/14/18 2055  TSH 9.124*   Anemia Panel: No results for input(s): VITAMINB12, FOLATE, FERRITIN, TIBC, IRON, RETICCTPCT in the last 72 hours. Sepsis Labs: Recent Labs  Lab 08/15/18 2358 08/16/18 0429  LATICACIDVEN 1.0 0.9    Recent Results (from the past 240 hour(s))  Culture, blood (routine x 2)     Status: None (Preliminary result)   Collection Time: 08/15/18  3:25 PM  Result Value Ref Range Status   Specimen Description BLOOD RIGHT [facility]  Final   Special Requests BOTTLES DRAWN AEROBIC AND ANAEROBIC  Final   Culture   Final    NO GROWTH < 24 HOURS Performed at [facility], [address], [place], [place] [address]    Report Status PENDING  Incomplete  Culture, blood (routine x 2)     Status: None (Preliminary result)   Collection Time: 08/15/18  3:25 PM  Result Value Ref Range Status   Specimen Description BLOOD RFOA  Final   Special Requests BOTTLES DRAWN AEROBIC AND ANAEROBIC  Final   Culture    Final    NO GROWTH < 24 HOURS Performed at [facility], [address], [place], [place] [address]    Report Status PENDING  Incomplete         Radiology Studies: Dg Chest 2 View  Result Date: 08/14/2018 CLINICAL DATA:  85 year old female with mid chest pain. EXAM: CHEST - 2 VIEW COMPARISON:  CTA chest 07/05/2018 and earlier. FINDINGS: Moderate scoliosis. Stable mild cardiomegaly and tortuosity of the thoracic aorta. Other mediastinal contours are within normal limits. Visualized tracheal air column is within normal limits. No pneumothorax, pulmonary edema, pleural effusion or acute pulmonary opacity. Negative visible bowel gas pattern. IMPRESSION: Stable.  No acute cardiopulmonary abnormality. Electronically Signed   By: Genevie Ann M.D.   On: 08/14/2018 21:17   Ct Abdomen Pelvis W Contrast  Result Date: 08/15/2018 CLINICAL DATA:  Upper abdominal pain and nausea. History of gallstones and atrial fibrillation. EXAM: CT ABDOMEN AND PELVIS WITH CONTRAST TECHNIQUE: Multidetector CT imaging of the abdomen and pelvis was performed using the standard protocol following bolus administration of intravenous contrast. CONTRAST:  23mL OMNIPAQUE IOHEXOL 300 MG/ML  SOLN COMPARISON:  MRI abdomen 07/06/2018. CT abdomen and pelvis 09/16/2017 FINDINGS: Lower chest: Small left pleural effusion. Atelectasis in both lung bases. Cardiac enlargement. Small esophageal hiatal hernia. Residual contrast material in the lower esophagus may represent reflux or dysmotility. Hepatobiliary: There is prominent intra and extrahepatic bile duct dilatation, increasing since the previous studies. Small stones are demonstrated in the gallbladder. Gallbladder wall is thickened. Gallbladder is mildly distended. Given these findings, this raises suspicion for an occult common bile duct stone although no radiopaque stone is demonstrated. Pancreas: Unremarkable. No pancreatic ductal dilatation or surrounding inflammatory changes.  Spleen: Normal in size without focal abnormality. Adrenals/Urinary Tract: Adrenal glands are unremarkable. Kidneys are normal, without renal calculi, focal lesion, or hydronephrosis. Bladder is unremarkable. Stomach/Bowel: Stomach, small bowel, and colon are not abnormally distended. Prominence of gastric rugal folds is unchanged since prior study and could represent gastritis. Scattered stool throughout the colon. Static colonic diverticula. No wall thickening or inflammatory changes. No evidence of diverticulitis. Vascular/Lymphatic: Aortic atherosclerosis. No enlarged abdominal or pelvic lymph nodes. Reproductive: Uterus and bilateral adnexa are unremarkable. Other: No free air or free fluid in the abdomen. Abdominal wall musculature appears intact. Musculoskeletal: Lumbar scoliosis convex towards the left. Degenerative changes in the lumbar spine. Old fracture deformities of the right superior and inferior pubic rami. Old compression deformity of L1 vertebra. IMPRESSION: 1. Intra and extrahepatic bile duct dilatation with gallbladder wall thickening and small stones. No radiopaque common bile duct stones are identified although changes are worrisome for an occult obstructing common bile duct stone. Consider ultrasound follow-up. 2. Small esophageal hiatal hernia with residual contrast material in the lower esophagus suggesting reflux or dysmotility. 3. Small left pleural effusion with basilar atelectasis. 4. Prominence of gastric rugal folds is unchanged since prior study and could indicate chronic gastritis. 5. Diverticulosis of the colon without evidence of diverticulitis. 6. Old fracture deformities of the right pubic rami and L1 vertebra. Lumbar scoliosis and degenerative changes. Aortic Atherosclerosis (ICD10-I70.0). Electronically Signed   By: Lucienne Capers M.D.   On: 08/15/2018 00:03   Mr 3d Recon At Scanner  Result Date: 08/15/2018 CLINICAL DATA:  Abnormal LFTs, cholelithiasis EXAM: MRI ABDOMEN  WITHOUT AND WITH CONTRAST (INCLUDING MRCP) TECHNIQUE: Multiplanar multisequence MR imaging of the abdomen was performed both before and after the administration of intravenous contrast. Heavily T2-weighted images of the biliary and pancreatic ducts were obtained, and three-dimensional MRCP images were rendered by post processing. CONTRAST:  4 mL Gadovist IV COMPARISON:  CT abdomen/pelvis dated 08/14/2018 and 09/16/2017. FINDINGS: Motion degraded images. Lower chest: Mild patchy opacities at the lung bases, likely dependent atelectasis. Hepatobiliary: Liver is within normal limits. No suspicious enhancing hepatic lesions. Layering small gallstones. Mild gallbladder distension. No pericholecystic fluid/inflammatory changes suggest acute cholecystitis. Mild intrahepatic ductal dilatation. Dilated common duct, measuring 14 mm, new from 2019. Suspected 7 mm distal CBD stone just above the ampulla, although poorly visualized/evaluated. Pancreas:  Within normal limits. Spleen:  Within normal limits. Adrenals/Urinary Tract:  Adrenal glands are within normal limits. 8 mm right upper pole renal cyst. Left kidney is within normal limits. No hydronephrosis. Stomach/Bowel: Stomach is notable for a tiny hiatal hernia. Visualized bowel is grossly unremarkable. Vascular/Lymphatic:  No evidence of abdominal  aortic aneurysm. No suspicious abdominal lymphadenopathy. Other:  No abdominal ascites. Musculoskeletal: Lower thoracolumbar levoscoliosis with mild degenerative changes. IMPRESSION: Cholelithiasis, without associated inflammatory changes suggest acute cholecystitis. Intrahepatic/extrahepatic ductal dilatation. Common duct measures 14 mm. These findings are new/progressive from 2019. Suspected 7 mm distal CBD stone just above the ampulla, poorly visualized/evaluated given motion degradation. Electronically Signed   By: Julian Hy M.D.   On: 08/15/2018 04:13   Mr Abdomen Mrcp Moise Boring Contast  Result Date:  08/15/2018 CLINICAL DATA:  Abnormal LFTs, cholelithiasis EXAM: MRI ABDOMEN WITHOUT AND WITH CONTRAST (INCLUDING MRCP) TECHNIQUE: Multiplanar multisequence MR imaging of the abdomen was performed both before and after the administration of intravenous contrast. Heavily T2-weighted images of the biliary and pancreatic ducts were obtained, and three-dimensional MRCP images were rendered by post processing. CONTRAST:  4 mL Gadovist IV COMPARISON:  CT abdomen/pelvis dated 08/14/2018 and 09/16/2017. FINDINGS: Motion degraded images. Lower chest: Mild patchy opacities at the lung bases, likely dependent atelectasis. Hepatobiliary: Liver is within normal limits. No suspicious enhancing hepatic lesions. Layering small gallstones. Mild gallbladder distension. No pericholecystic fluid/inflammatory changes suggest acute cholecystitis. Mild intrahepatic ductal dilatation. Dilated common duct, measuring 14 mm, new from 2019. Suspected 7 mm distal CBD stone just above the ampulla, although poorly visualized/evaluated. Pancreas:  Within normal limits. Spleen:  Within normal limits. Adrenals/Urinary Tract:  Adrenal glands are within normal limits. 8 mm right upper pole renal cyst. Left kidney is within normal limits. No hydronephrosis. Stomach/Bowel: Stomach is notable for a tiny hiatal hernia. Visualized bowel is grossly unremarkable. Vascular/Lymphatic:  No evidence of abdominal aortic aneurysm. No suspicious abdominal lymphadenopathy. Other:  No abdominal ascites. Musculoskeletal: Lower thoracolumbar levoscoliosis with mild degenerative changes. IMPRESSION: Cholelithiasis, without associated inflammatory changes suggest acute cholecystitis. Intrahepatic/extrahepatic ductal dilatation. Common duct measures 14 mm. These findings are new/progressive from 2019. Suspected 7 mm distal CBD stone just above the ampulla, poorly visualized/evaluated given motion degradation. Electronically Signed   By: Julian Hy M.D.   On:  08/15/2018 04:13        Scheduled Meds: Continuous Infusions:  sodium chloride 100 mL/hr at 08/16/18 1033   piperacillin-tazobactam (ZOSYN)  IV 3.375 g (08/16/18 0528)     LOS: 1 day        Aline August, MD [facility] 08/16/2018, 10:57 AM

## 2018-08-16 NOTE — Sedation Documentation (Signed)
Vital signs stable. Pt is sleeping at this time, procedure continues

## 2018-08-16 NOTE — Procedures (Signed)
Pre procedural Dx: Acute cholecysitis Post procedural Dx: Same  Technically successful US and Fluoro guided placement of a 10 Fr drainage catheter placement into the gallbladder lumen. Chole tube connected to gravity bag.  EBL: None  Complications: None immediate  Jay Thao Vanover, MD Pager #: [phone]   

## 2018-08-16 NOTE — Consult Note (Addendum)
CC: Consult by Dr. Paulita Fujita for choledocholithiasis with possible cholangitis  HPI: Kylie May is a very pleasant 85 y.o. female with hx of Afib (on Eliquis), GERD, diastolic heart failure, severe mitral regurgitation whom presented to the ED yesterday with RUQ pain. She describes this as improving since being admitted; sharp. Denies radiation. Antibiotics have seemed to help, nothing seems to make it worse. She reports similar symptoms 1 mo ago at [facility]. She was told she may have passed a gallstone down the bile duct. Her symptoms improved and she was discharged. [place] that admission was equivocal for cholecystitis - mildly thickened wall, and stones. MRCP that admission showed gallstones without evidence of cholecystitis or choledocholithiasis. She also reports hx of ERCP and stent placement 10 years ago for similar. It's unclear if she saw a surgeon for this issue.  LFTs were elevated on admission and have began to down-trend. Total bilirubin 2.6 from 3.6 from 3.8. Lipase mildly elevated at 107  WBC 14.8 from 15 from 16.8  CT A/P with contrast 3/14 - intrahepatic and extrahepatic bile duct dilatation with gallbladder wall thickening and small stones  MRCP 3/14 showed gallstones without associated inflammatory changes to suggest acute cholecystitis; intra/extrahepatic ductal dilatation - CBD 14 mm; +7 mm stone in CBD just above ampulla  She was seen by GI. She was being considered for ECRP but noted to be at significant cardiac risk with general anesthesia. She is currently in afib with rvr and Bps 80s-90s/60s. Cardiology has also seen her but at their exam noted sinus tachycardia.   Past Medical History:  Diagnosis Date   A-fib ([place])    Acid reflux disease    Arrhythmia    Atrial fibrillation with RVR ([place]) 08/15/2018   Chronic diastolic CHF (congestive heart failure) (HCC)    Compression fracture of lumbar vertebra (HCC)    Diverticulitis 01-2008   [facility]    Diverticulosis    Esophageal stricture 2006   Femoral bruit    Right   Fibrocystic breast disease    Hiatal hernia    Hypercholesterolemia    [place] study LDL goal = < 160   Mitral regurgitation    a. 06/2014 EF 55-60%, elevated end-diastolic pressures, dilated LA at 4.3 cm, mildly dilated RA, severe mitral regurgitation, mild aortic sclerosis without stenosis, mod-severe TR   Mitral valve prolapse    Osteoporosis    Dr Matthew Saras   Pancreatitis 07-2008   Hospitalized    Schatzki's ring    Sepsis ([place]) 08/15/2018   Skin cancer    facial x 2. [name]    Past Surgical History:  Procedure Laterality Date   COLONOSCOPY  2006   Epidural steroids     x1   ESOPHAGEAL DILATION  2006   G3 P4 (twins)     SKIN CANCER EXCISION      Family History  Problem Relation Age of Onset   Breast cancer Maternal Aunt    Hepatitis Daughter        C   Lung disease Mother        lung tumor   Heart attack Daughter 4       S/P stents   Heart attack Sister 31   Diabetes Neg Hx    Stroke Neg Hx     Social:  reports that she quit smoking about 67 years ago. She has never used smokeless tobacco. She reports current alcohol use of about 7.0 standard drinks of alcohol per week. She reports that she  does not use drugs.  Allergies:  Allergies  Allergen Reactions   Metronidazole     Other reaction(s): Dizziness and giddiness (finding), Other (qualifier value) made things feel like they were vibrating like the bed and floor,also caused mild dizziness weakness   Penicillin G Rash    Medications: I have reviewed the patient's current medications.  Results for orders placed or performed during the hospital encounter of 08/15/18 (from the past 48 hour(s))  Comprehensive metabolic panel     Status: Abnormal   Collection Time: 08/15/18  8:40 PM  Result Value Ref Range   Sodium 133 (L) 135 - 145 mmol/L   Potassium 3.8 3.5 - 5.1 mmol/L   Chloride 103 98 - 111 mmol/L    CO2 21 (L) 22 - 32 mmol/L   Glucose, Bld 99 70 - 99 mg/dL   BUN 17 8 - 23 mg/dL   Creatinine, Ser 1.36 (H) 0.44 - 1.00 mg/dL   Calcium 7.9 (L) 8.9 - 10.3 mg/dL   Total Protein 5.6 (L) 6.5 - 8.1 g/dL   Albumin 2.7 (L) 3.5 - 5.0 g/dL   AST 180 (H) 15 - 41 U/L   ALT 153 (H) 0 - 44 U/L   Alkaline Phosphatase 136 (H) 38 - 126 U/L   Total Bilirubin 3.6 (H) 0.3 - 1.2 mg/dL   GFR calc non Af Amer 33 (L) >60 mL/min   GFR calc Af Amer 39 (L) >60 mL/min   Anion gap 9 5 - 15    Comment: Performed at [facility], [address], [place], [place] [address]  Magnesium     Status: Abnormal   Collection Time: 08/15/18  8:40 PM  Result Value Ref Range   Magnesium 1.1 (L) 1.7 - 2.4 mg/dL    Comment: Performed at [facility] [address], [place], [place] [address]  CBC WITH DIFFERENTIAL     Status: Abnormal   Collection Time: 08/15/18  8:40 PM  Result Value Ref Range   WBC 15.0 (H) 4.0 - 10.5 K/uL    Comment: REPEATED TO VERIFY   RBC 3.40 (L) 3.87 - 5.11 MIL/uL   Hemoglobin 9.6 (L) 12.0 - 15.0 g/dL   HCT 29.9 (L) 36.0 - 46.0 %   MCV 87.9 80.0 - 100.0 fL   MCH 28.2 26.0 - 34.0 pg   MCHC 32.1 30.0 - 36.0 g/dL   RDW 13.1 11.5 - 15.5 %   Platelets 203 150 - 400 K/uL   nRBC 0.0 0.0 - 0.2 %   Neutrophils Relative % 86 %   Neutro Abs 12.9 (H) 1.7 - 7.7 K/uL   Lymphocytes Relative 7 %   Lymphs Abs 1.0 0.7 - 4.0 K/uL   Monocytes Relative 6 %   Monocytes Absolute 0.9 0.1 - 1.0 K/uL   Eosinophils Relative 0 %   Eosinophils Absolute 0.0 0.0 - 0.5 K/uL   Basophils Relative 0 %   Basophils Absolute 0.0 0.0 - 0.1 K/uL   Immature Granulocytes 1 %   Abs Immature Granulocytes 0.21 (H) 0.00 - 0.07 K/uL    Comment: Performed at [facility], [place] [address] [place], [place] [address]  Protime-INR     Status: Abnormal   Collection Time: 08/15/18  8:40 PM  Result Value Ref Range   Prothrombin Time 16.4 (H) 11.4 - 15.2 seconds   INR 1.3 (H) 0.8 - 1.2    Comment: (NOTE) INR goal  varies based on device and disease states.  Performed at [facility], [place] [address] [place], [place] [address]   Brain natriuretic peptide     Status: Abnormal   Collection Time: 08/15/18  8:40 PM  Result Value Ref Range   B Natriuretic Peptide 1,005.7 (H) 0.0 - 100.0 pg/mL    Comment: Performed at [facility] [address], [place], [place] [address]  Glucose, capillary     Status: None   Collection Time: 08/15/18 10:03 PM  Result Value Ref Range   Glucose-Capillary 99 70 - 99 mg/dL  Lactic acid, plasma     Status: None   Collection Time: 08/15/18 11:58 PM  Result Value Ref Range   Lactic Acid, Venous 1.0 0.5 - 1.9 mmol/L    Comment: Performed at [facility] [address], [place], [place] [address]  Glucose, capillary     Status: None   Collection Time: 08/16/18  4:13 AM  Result Value Ref Range   Glucose-Capillary 82 70 - 99 mg/dL  Basic metabolic panel     Status: Abnormal   Collection Time: 08/16/18  4:28 AM  Result Value Ref Range   Sodium 135 135 - 145 mmol/L   Potassium 3.8 3.5 - 5.1 mmol/L   Chloride 106 98 - 111 mmol/L   CO2 19 (L) 22 - 32 mmol/L   Glucose, Bld 88 70 - 99 mg/dL   BUN 16 8 - 23 mg/dL   Creatinine, Ser 1.17 (H) 0.44 - 1.00 mg/dL   Calcium 8.0 (L) 8.9 - 10.3 mg/dL   GFR calc non Af Amer 40 (L) >60 mL/min   GFR calc Af Amer 46 (L) >60 mL/min   Anion gap 10 5 - 15    Comment: Performed at [facility], [place] [address] [place], [place] [address]  Magnesium     Status: None   Collection Time: 08/16/18  4:28 AM  Result Value Ref Range   Magnesium 2.0 1.7 - 2.4 mg/dL    Comment: Performed at [facility] [address], [place], [place] [address]  Lactic acid, plasma     Status: None   Collection Time: 08/16/18  4:29 AM  Result Value Ref Range   Lactic Acid, Venous 0.9 0.5 - 1.9 mmol/L    Comment: Performed at [facility] [address] [place], [place] [address]  Hepatic function panel     Status:  Abnormal   Collection Time: 08/16/18  9:38 AM  Result Value Ref Range   Total Protein 5.2 (L) 6.5 - 8.1 g/dL   Albumin 2.5 (L) 3.5 - 5.0 g/dL   AST 106 (H) 15 - 41 U/L   ALT 117 (H) 0 - 44 U/L   Alkaline Phosphatase 128 (H) 38 - 126 U/L   Total Bilirubin 2.6 (H) 0.3 - 1.2 mg/dL   Bilirubin, Direct 1.8 (H) 0.0 - 0.2 mg/dL   Indirect Bilirubin 0.8 0.3 - 0.9 mg/dL    Comment: Performed at [facility], [place] [address], [place] [address]  CBC with Differential/Platelet     Status: Abnormal   Collection Time: 08/16/18  9:38 AM  Result Value Ref Range   WBC 14.8 (H) 4.0 - 10.5 K/uL   RBC 3.60 (L) 3.87 - 5.11 MIL/uL   Hemoglobin 10.0 (L) 12.0 - 15.0 g/dL   HCT 31.2 (L) 36.0 - 46.0 %   MCV 86.7 80.0 - 100.0 fL   MCH 27.8 26.0 - 34.0 pg  MCHC 32.1 30.0 - 36.0 g/dL   RDW 13.2 11.5 - 15.5 %   Platelets 204 150 - 400 K/uL   nRBC 0.0 0.0 - 0.2 %   Neutrophils Relative % 87 %   Neutro Abs 13.0 (H) 1.7 - 7.7 K/uL   Lymphocytes Relative 6 %   Lymphs Abs 0.9 0.7 - 4.0 K/uL   Monocytes Relative 5 %   Monocytes Absolute 0.7 0.1 - 1.0 K/uL   Eosinophils Relative 0 %   Eosinophils Absolute 0.0 0.0 - 0.5 K/uL   Basophils Relative 0 %   Basophils Absolute 0.0 0.0 - 0.1 K/uL   Immature Granulocytes 2 %   Abs Immature Granulocytes 0.22 (H) 0.00 - 0.07 K/uL    Comment: Performed at [facility] [address] [place], [place] [address]    Dg Chest 2 View  Result Date: 08/14/2018 CLINICAL DATA:  85 year old female with mid chest pain. EXAM: CHEST - 2 VIEW COMPARISON:  CTA chest 07/05/2018 and earlier. FINDINGS: Moderate scoliosis. Stable mild cardiomegaly and tortuosity of the thoracic aorta. Other mediastinal contours are within normal limits. Visualized tracheal air column is within normal limits. No pneumothorax, pulmonary edema, pleural effusion or acute pulmonary opacity. Negative visible bowel gas pattern. IMPRESSION: Stable.  No acute cardiopulmonary abnormality.  Electronically Signed   By: Genevie Ann M.D.   On: 08/14/2018 21:17   Ct Abdomen Pelvis W Contrast  Result Date: 08/15/2018 CLINICAL DATA:  Upper abdominal pain and nausea. History of gallstones and atrial fibrillation. EXAM: CT ABDOMEN AND PELVIS WITH CONTRAST TECHNIQUE: Multidetector CT imaging of the abdomen and pelvis was performed using the standard protocol following bolus administration of intravenous contrast. CONTRAST:  91m OMNIPAQUE IOHEXOL 300 MG/ML  SOLN COMPARISON:  MRI abdomen 07/06/2018. CT abdomen and pelvis 09/16/2017 FINDINGS: Lower chest: Small left pleural effusion. Atelectasis in both lung bases. Cardiac enlargement. Small esophageal hiatal hernia. Residual contrast material in the lower esophagus may represent reflux or dysmotility. Hepatobiliary: There is prominent intra and extrahepatic bile duct dilatation, increasing since the previous studies. Small stones are demonstrated in the gallbladder. Gallbladder wall is thickened. Gallbladder is mildly distended. Given these findings, this raises suspicion for an occult common bile duct stone although no radiopaque stone is demonstrated. Pancreas: Unremarkable. No pancreatic ductal dilatation or surrounding inflammatory changes. Spleen: Normal in size without focal abnormality. Adrenals/Urinary Tract: Adrenal glands are unremarkable. Kidneys are normal, without renal calculi, focal lesion, or hydronephrosis. Bladder is unremarkable. Stomach/Bowel: Stomach, small bowel, and colon are not abnormally distended. Prominence of gastric rugal folds is unchanged since prior study and could represent gastritis. Scattered stool throughout the colon. Static colonic diverticula. No wall thickening or inflammatory changes. No evidence of diverticulitis. Vascular/Lymphatic: Aortic atherosclerosis. No enlarged abdominal or pelvic lymph nodes. Reproductive: Uterus and bilateral adnexa are unremarkable. Other: No free air or free fluid in the abdomen. Abdominal  wall musculature appears intact. Musculoskeletal: Lumbar scoliosis convex towards the left. Degenerative changes in the lumbar spine. Old fracture deformities of the right superior and inferior pubic rami. Old compression deformity of L1 vertebra. IMPRESSION: 1. Intra and extrahepatic bile duct dilatation with gallbladder wall thickening and small stones. No radiopaque common bile duct stones are identified although changes are worrisome for an occult obstructing common bile duct stone. Consider ultrasound follow-up. 2. Small esophageal hiatal hernia with residual contrast material in the lower esophagus suggesting reflux or dysmotility. 3. Small left pleural effusion with basilar atelectasis. 4. Prominence of gastric rugal folds is unchanged  since prior study and could indicate chronic gastritis. 5. Diverticulosis of the colon without evidence of diverticulitis. 6. Old fracture deformities of the right pubic rami and L1 vertebra. Lumbar scoliosis and degenerative changes. Aortic Atherosclerosis (ICD10-I70.0). Electronically Signed   By: Lucienne Capers M.D.   On: 08/15/2018 00:03   Mr 3d Recon At Scanner  Result Date: 08/15/2018 CLINICAL DATA:  Abnormal LFTs, cholelithiasis EXAM: MRI ABDOMEN WITHOUT AND WITH CONTRAST (INCLUDING MRCP) TECHNIQUE: Multiplanar multisequence MR imaging of the abdomen was performed both before and after the administration of intravenous contrast. Heavily T2-weighted images of the biliary and pancreatic ducts were obtained, and three-dimensional MRCP images were rendered by post processing. CONTRAST:  4 mL Gadovist IV COMPARISON:  CT abdomen/pelvis dated 08/14/2018 and 09/16/2017. FINDINGS: Motion degraded images. Lower chest: Mild patchy opacities at the lung bases, likely dependent atelectasis. Hepatobiliary: Liver is within normal limits. No suspicious enhancing hepatic lesions. Layering small gallstones. Mild gallbladder distension. No pericholecystic fluid/inflammatory changes  suggest acute cholecystitis. Mild intrahepatic ductal dilatation. Dilated common duct, measuring 14 mm, new from 2019. Suspected 7 mm distal CBD stone just above the ampulla, although poorly visualized/evaluated. Pancreas:  Within normal limits. Spleen:  Within normal limits. Adrenals/Urinary Tract:  Adrenal glands are within normal limits. 8 mm right upper pole renal cyst. Left kidney is within normal limits. No hydronephrosis. Stomach/Bowel: Stomach is notable for a tiny hiatal hernia. Visualized bowel is grossly unremarkable. Vascular/Lymphatic:  No evidence of abdominal aortic aneurysm. No suspicious abdominal lymphadenopathy. Other:  No abdominal ascites. Musculoskeletal: Lower thoracolumbar levoscoliosis with mild degenerative changes. IMPRESSION: Cholelithiasis, without associated inflammatory changes suggest acute cholecystitis. Intrahepatic/extrahepatic ductal dilatation. Common duct measures 14 mm. These findings are new/progressive from 2019. Suspected 7 mm distal CBD stone just above the ampulla, poorly visualized/evaluated given motion degradation. Electronically Signed   By: Julian Hy M.D.   On: 08/15/2018 04:13   Mr Abdomen Mrcp Moise Boring Contast  Result Date: 08/15/2018 CLINICAL DATA:  Abnormal LFTs, cholelithiasis EXAM: MRI ABDOMEN WITHOUT AND WITH CONTRAST (INCLUDING MRCP) TECHNIQUE: Multiplanar multisequence MR imaging of the abdomen was performed both before and after the administration of intravenous contrast. Heavily T2-weighted images of the biliary and pancreatic ducts were obtained, and three-dimensional MRCP images were rendered by post processing. CONTRAST:  4 mL Gadovist IV COMPARISON:  CT abdomen/pelvis dated 08/14/2018 and 09/16/2017. FINDINGS: Motion degraded images. Lower chest: Mild patchy opacities at the lung bases, likely dependent atelectasis. Hepatobiliary: Liver is within normal limits. No suspicious enhancing hepatic lesions. Layering small gallstones. Mild gallbladder  distension. No pericholecystic fluid/inflammatory changes suggest acute cholecystitis. Mild intrahepatic ductal dilatation. Dilated common duct, measuring 14 mm, new from 2019. Suspected 7 mm distal CBD stone just above the ampulla, although poorly visualized/evaluated. Pancreas:  Within normal limits. Spleen:  Within normal limits. Adrenals/Urinary Tract:  Adrenal glands are within normal limits. 8 mm right upper pole renal cyst. Left kidney is within normal limits. No hydronephrosis. Stomach/Bowel: Stomach is notable for a tiny hiatal hernia. Visualized bowel is grossly unremarkable. Vascular/Lymphatic:  No evidence of abdominal aortic aneurysm. No suspicious abdominal lymphadenopathy. Other:  No abdominal ascites. Musculoskeletal: Lower thoracolumbar levoscoliosis with mild degenerative changes. IMPRESSION: Cholelithiasis, without associated inflammatory changes suggest acute cholecystitis. Intrahepatic/extrahepatic ductal dilatation. Common duct measures 14 mm. These findings are new/progressive from 2019. Suspected 7 mm distal CBD stone just above the ampulla, poorly visualized/evaluated given motion degradation. Electronically Signed   By: Julian Hy M.D.   On: 08/15/2018 04:13    ROS - all  of the below systems have been reviewed with the patient and positives are indicated with bold text General: chills, fever or night sweats Eyes: blurry vision or double vision ENT: epistaxis or sore throat Allergy/Immunology: itchy/watery eyes or nasal congestion Hematologic/Lymphatic: bleeding problems, blood clots or swollen lymph nodes Endocrine: temperature intolerance or unexpected weight changes Breast: new or changing breast lumps or nipple discharge Resp: cough, shortness of breath, or wheezing CV: chest pain or dyspnea on exertion GI: as per HPI GU: dysuria, trouble voiding, or hematuria MSK: joint pain or joint stiffness Neuro: TIA or stroke symptoms Derm: pruritus and skin lesion  changes Psych: anxiety and depression  PE Blood pressure 94/61, pulse (!) 131, temperature 98.1 F (36.7 C), temperature source Axillary, resp. rate (!) 21, height 4' 9.01" (1.448 m), weight 50.8 kg, SpO2 99 %. Constitutional: NAD; conversant; no deformities Eyes: Moist conjunctiva; no lid lag; anicteric; PERRL Neck: Trachea midline; no thyromegaly Lungs: Normal respiratory effort; no tactile fremitus CV: RRR; no palpable thrills; no pitting edema GI: Abd soft, minimal RUQ tenderness; nondistended; no palpable hepatosplenomegaly MSK: No clubbing/cyanosis Psychiatric: Appropriate affect; alert and oriented x3 Lymphatic: No palpable cervical or axillary lymphadenopathy  Results for orders placed or performed during the hospital encounter of 08/15/18 (from the past 48 hour(s))  Comprehensive metabolic panel     Status: Abnormal   Collection Time: 08/15/18  8:40 PM  Result Value Ref Range   Sodium 133 (L) 135 - 145 mmol/L   Potassium 3.8 3.5 - 5.1 mmol/L   Chloride 103 98 - 111 mmol/L   CO2 21 (L) 22 - 32 mmol/L   Glucose, Bld 99 70 - 99 mg/dL   BUN 17 8 - 23 mg/dL   Creatinine, Ser 1.36 (H) 0.44 - 1.00 mg/dL   Calcium 7.9 (L) 8.9 - 10.3 mg/dL   Total Protein 5.6 (L) 6.5 - 8.1 g/dL   Albumin 2.7 (L) 3.5 - 5.0 g/dL   AST 180 (H) 15 - 41 U/L   ALT 153 (H) 0 - 44 U/L   Alkaline Phosphatase 136 (H) 38 - 126 U/L   Total Bilirubin 3.6 (H) 0.3 - 1.2 mg/dL   GFR calc non Af Amer 33 (L) >60 mL/min   GFR calc Af Amer 39 (L) >60 mL/min   Anion gap 9 5 - 15    Comment: Performed at [facility], [address] [place], [place] [address]  Magnesium     Status: Abnormal   Collection Time: 08/15/18  8:40 PM  Result Value Ref Range   Magnesium 1.1 (L) 1.7 - 2.4 mg/dL    Comment: Performed at [facility] [address], [place], [place] [address]  CBC WITH DIFFERENTIAL     Status: Abnormal   Collection Time: 08/15/18  8:40 PM  Result Value Ref Range   WBC 15.0 (H) 4.0 - 10.5  K/uL    Comment: REPEATED TO VERIFY   RBC 3.40 (L) 3.87 - 5.11 MIL/uL   Hemoglobin 9.6 (L) 12.0 - 15.0 g/dL   HCT 29.9 (L) 36.0 - 46.0 %   MCV 87.9 80.0 - 100.0 fL   MCH 28.2 26.0 - 34.0 pg   MCHC 32.1 30.0 - 36.0 g/dL   RDW 13.1 11.5 - 15.5 %   Platelets 203 150 - 400 K/uL   nRBC 0.0 0.0 - 0.2 %   Neutrophils Relative % 86 %   Neutro Abs 12.9 (H) 1.7 - 7.7 K/uL   Lymphocytes Relative 7 %  Lymphs Abs 1.0 0.7 - 4.0 K/uL   Monocytes Relative 6 %   Monocytes Absolute 0.9 0.1 - 1.0 K/uL   Eosinophils Relative 0 %   Eosinophils Absolute 0.0 0.0 - 0.5 K/uL   Basophils Relative 0 %   Basophils Absolute 0.0 0.0 - 0.1 K/uL   Immature Granulocytes 1 %   Abs Immature Granulocytes 0.21 (H) 0.00 - 0.07 K/uL    Comment: Performed at [facility] [address], [place], [place] [address]  Protime-INR     Status: Abnormal   Collection Time: 08/15/18  8:40 PM  Result Value Ref Range   Prothrombin Time 16.4 (H) 11.4 - 15.2 seconds   INR 1.3 (H) 0.8 - 1.2    Comment: (NOTE) INR goal varies based on device and disease states. Performed at [facility], [place] [address], [place], [place] [address]   Brain natriuretic peptide     Status: Abnormal   Collection Time: 08/15/18  8:40 PM  Result Value Ref Range   B Natriuretic Peptide 1,005.7 (H) 0.0 - 100.0 pg/mL    Comment: Performed at [facility] [address], [place], [place] [address]  Glucose, capillary     Status: None   Collection Time: 08/15/18 10:03 PM  Result Value Ref Range   Glucose-Capillary 99 70 - 99 mg/dL  Lactic acid, plasma     Status: None   Collection Time: 08/15/18 11:58 PM  Result Value Ref Range   Lactic Acid, Venous 1.0 0.5 - 1.9 mmol/L    Comment: Performed at  [facility] [address] [place], [place] [address]  Glucose, capillary     Status: None   Collection Time: 08/16/18  4:13 AM  Result Value Ref Range   Glucose-Capillary 82 70 - 99 mg/dL  Basic metabolic panel     Status:  Abnormal   Collection Time: 08/16/18  4:28 AM  Result Value Ref Range   Sodium 135 135 - 145 mmol/L   Potassium 3.8 3.5 - 5.1 mmol/L   Chloride 106 98 - 111 mmol/L   CO2 19 (L) 22 - 32 mmol/L   Glucose, Bld 88 70 - 99 mg/dL   BUN 16 8 - 23 mg/dL   Creatinine, Ser 1.17 (H) 0.44 - 1.00 mg/dL   Calcium 8.0 (L) 8.9 - 10.3 mg/dL   GFR calc non Af Amer 40 (L) >60 mL/min   GFR calc Af Amer 46 (L) >60 mL/min   Anion gap 10 5 - 15    Comment: Performed at [facility], [place] [address] [place], [place] [address]  Magnesium     Status: None   Collection Time: 08/16/18  4:28 AM  Result Value Ref Range   Magnesium 2.0 1.7 - 2.4 mg/dL    Comment: Performed at [facility] [address] [place], [place] [address]  Lactic acid, plasma     Status: None   Collection Time: 08/16/18  4:29 AM  Result Value Ref Range   Lactic Acid, Venous 0.9 0.5 - 1.9 mmol/L    Comment: Performed at [facility] [address], [place], [place] [address]  Hepatic function panel     Status: Abnormal   Collection Time: 08/16/18  9:38 AM  Result Value Ref Range   Total Protein 5.2 (L) 6.5 - 8.1 g/dL   Albumin 2.5 (L) 3.5 - 5.0 g/dL   AST 106 (H) 15 - 41 U/L  ALT 117 (H) 0 - 44 U/L   Alkaline Phosphatase 128 (H) 38 - 126 U/L   Total Bilirubin 2.6 (H) 0.3 - 1.2 mg/dL   Bilirubin, Direct 1.8 (H) 0.0 - 0.2 mg/dL   Indirect Bilirubin 0.8 0.3 - 0.9 mg/dL    Comment: Performed at [facility] [address] [place], [place] [address]  CBC with Differential/Platelet     Status: Abnormal   Collection Time: 08/16/18  9:38 AM  Result Value Ref Range   WBC 14.8 (H) 4.0 - 10.5 K/uL   RBC 3.60 (L) 3.87 - 5.11 MIL/uL   Hemoglobin 10.0 (L) 12.0 - 15.0 g/dL   HCT 31.2 (L) 36.0 - 46.0 %   MCV 86.7 80.0 - 100.0 fL   MCH 27.8 26.0 - 34.0 pg   MCHC 32.1 30.0 - 36.0 g/dL   RDW 13.2 11.5 - 15.5 %   Platelets 204 150 - 400 K/uL   nRBC 0.0 0.0 - 0.2 %   Neutrophils Relative % 87 %   Neutro Abs 13.0  (H) 1.7 - 7.7 K/uL   Lymphocytes Relative 6 %   Lymphs Abs 0.9 0.7 - 4.0 K/uL   Monocytes Relative 5 %   Monocytes Absolute 0.7 0.1 - 1.0 K/uL   Eosinophils Relative 0 %   Eosinophils Absolute 0.0 0.0 - 0.5 K/uL   Basophils Relative 0 %   Basophils Absolute 0.0 0.0 - 0.1 K/uL   Immature Granulocytes 2 %   Abs Immature Granulocytes 0.22 (H) 0.00 - 0.07 K/uL    Comment: Performed at [facility] [address] [place], [place] [address]    Dg Chest 2 View  Result Date: 08/14/2018 CLINICAL DATA:  85 year old female with mid chest pain. EXAM: CHEST - 2 VIEW COMPARISON:  CTA chest 07/05/2018 and earlier. FINDINGS: Moderate scoliosis. Stable mild cardiomegaly and tortuosity of the thoracic aorta. Other mediastinal contours are within normal limits. Visualized tracheal air column is within normal limits. No pneumothorax, pulmonary edema, pleural effusion or acute pulmonary opacity. Negative visible bowel gas pattern. IMPRESSION: Stable.  No acute cardiopulmonary abnormality. Electronically Signed   By: Genevie Ann M.D.   On: 08/14/2018 21:17   Ct Abdomen Pelvis W Contrast  Result Date: 08/15/2018 CLINICAL DATA:  Upper abdominal pain and nausea. History of gallstones and atrial fibrillation. EXAM: CT ABDOMEN AND PELVIS WITH CONTRAST TECHNIQUE: Multidetector CT imaging of the abdomen and pelvis was performed using the standard protocol following bolus administration of intravenous contrast. CONTRAST:  5m OMNIPAQUE IOHEXOL 300 MG/ML  SOLN COMPARISON:  MRI abdomen 07/06/2018. CT abdomen and pelvis 09/16/2017 FINDINGS: Lower chest: Small left pleural effusion. Atelectasis in both lung bases. Cardiac enlargement. Small esophageal hiatal hernia. Residual contrast material in the lower esophagus may represent reflux or dysmotility. Hepatobiliary: There is prominent intra and extrahepatic bile duct dilatation, increasing since the previous studies. Small stones are demonstrated in the gallbladder.  Gallbladder wall is thickened. Gallbladder is mildly distended. Given these findings, this raises suspicion for an occult common bile duct stone although no radiopaque stone is demonstrated. Pancreas: Unremarkable. No pancreatic ductal dilatation or surrounding inflammatory changes. Spleen: Normal in size without focal abnormality. Adrenals/Urinary Tract: Adrenal glands are unremarkable. Kidneys are normal, without renal calculi, focal lesion, or hydronephrosis. Bladder is unremarkable. Stomach/Bowel: Stomach, small bowel, and colon are not abnormally distended. Prominence of gastric rugal folds is unchanged since prior study and could represent gastritis. Scattered stool throughout the colon. Static colonic diverticula. No wall thickening  or inflammatory changes. No evidence of diverticulitis. Vascular/Lymphatic: Aortic atherosclerosis. No enlarged abdominal or pelvic lymph nodes. Reproductive: Uterus and bilateral adnexa are unremarkable. Other: No free air or free fluid in the abdomen. Abdominal wall musculature appears intact. Musculoskeletal: Lumbar scoliosis convex towards the left. Degenerative changes in the lumbar spine. Old fracture deformities of the right superior and inferior pubic rami. Old compression deformity of L1 vertebra. IMPRESSION: 1. Intra and extrahepatic bile duct dilatation with gallbladder wall thickening and small stones. No radiopaque common bile duct stones are identified although changes are worrisome for an occult obstructing common bile duct stone. Consider ultrasound follow-up. 2. Small esophageal hiatal hernia with residual contrast material in the lower esophagus suggesting reflux or dysmotility. 3. Small left pleural effusion with basilar atelectasis. 4. Prominence of gastric rugal folds is unchanged since prior study and could indicate chronic gastritis. 5. Diverticulosis of the colon without evidence of diverticulitis. 6. Old fracture deformities of the right pubic rami and L1  vertebra. Lumbar scoliosis and degenerative changes. Aortic Atherosclerosis (ICD10-I70.0). Electronically Signed   By: Lucienne Capers M.D.   On: 08/15/2018 00:03   Mr 3d Recon At Scanner  Result Date: 08/15/2018 CLINICAL DATA:  Abnormal LFTs, cholelithiasis EXAM: MRI ABDOMEN WITHOUT AND WITH CONTRAST (INCLUDING MRCP) TECHNIQUE: Multiplanar multisequence MR imaging of the abdomen was performed both before and after the administration of intravenous contrast. Heavily T2-weighted images of the biliary and pancreatic ducts were obtained, and three-dimensional MRCP images were rendered by post processing. CONTRAST:  4 mL Gadovist IV COMPARISON:  CT abdomen/pelvis dated 08/14/2018 and 09/16/2017. FINDINGS: Motion degraded images. Lower chest: Mild patchy opacities at the lung bases, likely dependent atelectasis. Hepatobiliary: Liver is within normal limits. No suspicious enhancing hepatic lesions. Layering small gallstones. Mild gallbladder distension. No pericholecystic fluid/inflammatory changes suggest acute cholecystitis. Mild intrahepatic ductal dilatation. Dilated common duct, measuring 14 mm, new from 2019. Suspected 7 mm distal CBD stone just above the ampulla, although poorly visualized/evaluated. Pancreas:  Within normal limits. Spleen:  Within normal limits. Adrenals/Urinary Tract:  Adrenal glands are within normal limits. 8 mm right upper pole renal cyst. Left kidney is within normal limits. No hydronephrosis. Stomach/Bowel: Stomach is notable for a tiny hiatal hernia. Visualized bowel is grossly unremarkable. Vascular/Lymphatic:  No evidence of abdominal aortic aneurysm. No suspicious abdominal lymphadenopathy. Other:  No abdominal ascites. Musculoskeletal: Lower thoracolumbar levoscoliosis with mild degenerative changes. IMPRESSION: Cholelithiasis, without associated inflammatory changes suggest acute cholecystitis. Intrahepatic/extrahepatic ductal dilatation. Common duct measures 14 mm. These findings  are new/progressive from 2019. Suspected 7 mm distal CBD stone just above the ampulla, poorly visualized/evaluated given motion degradation. Electronically Signed   By: Julian Hy M.D.   On: 08/15/2018 04:13   Mr Abdomen Mrcp Moise Boring Contast  Result Date: 08/15/2018 CLINICAL DATA:  Abnormal LFTs, cholelithiasis EXAM: MRI ABDOMEN WITHOUT AND WITH CONTRAST (INCLUDING MRCP) TECHNIQUE: Multiplanar multisequence MR imaging of the abdomen was performed both before and after the administration of intravenous contrast. Heavily T2-weighted images of the biliary and pancreatic ducts were obtained, and three-dimensional MRCP images were rendered by post processing. CONTRAST:  4 mL Gadovist IV COMPARISON:  CT abdomen/pelvis dated 08/14/2018 and 09/16/2017. FINDINGS: Motion degraded images. Lower chest: Mild patchy opacities at the lung bases, likely dependent atelectasis. Hepatobiliary: Liver is within normal limits. No suspicious enhancing hepatic lesions. Layering small gallstones. Mild gallbladder distension. No pericholecystic fluid/inflammatory changes suggest acute cholecystitis. Mild intrahepatic ductal dilatation. Dilated common duct, measuring 14 mm, new from 2019. Suspected 7 mm distal  CBD stone just above the ampulla, although poorly visualized/evaluated. Pancreas:  Within normal limits. Spleen:  Within normal limits. Adrenals/Urinary Tract:  Adrenal glands are within normal limits. 8 mm right upper pole renal cyst. Left kidney is within normal limits. No hydronephrosis. Stomach/Bowel: Stomach is notable for a tiny hiatal hernia. Visualized bowel is grossly unremarkable. Vascular/Lymphatic:  No evidence of abdominal aortic aneurysm. No suspicious abdominal lymphadenopathy. Other:  No abdominal ascites. Musculoskeletal: Lower thoracolumbar levoscoliosis with mild degenerative changes. IMPRESSION: Cholelithiasis, without associated inflammatory changes suggest acute cholecystitis. Intrahepatic/extrahepatic  ductal dilatation. Common duct measures 14 mm. These findings are new/progressive from 2019. Suspected 7 mm distal CBD stone just above the ampulla, poorly visualized/evaluated given motion degradation. Electronically Signed   By: Julian Hy M.D.   On: 08/15/2018 04:13   A/P: AIBHLINN KALMAR is an 85 y.o. female with Afib (on Eliquis), GERD, diastolic heart failure, severe mitral regurgitation with choledocholithiasis and possible cholecystitis  -Continue IV abx -Regarding options - ideally ERCP to clear duct and potentially relieve septic source but concerns exist about procedure toleration; given afib/RVR and soft Bps would also discuss with cardiology if this is the source of hypotension as opposed to cholangitis as her bilirubin is now down trending -Options for biliary decompression include ERCP or PTC placement -Would ideally obtain HIDA scan before placing a cholecystostomy tube as this may not be necessary; that said, if she is going for PTC placement urgently and clinically not suitable for HIDA scan, would plan for cholecystostomy tube placement at that time as well -We will follow with you  Sharon Mt. Dema Severin, M.D. [facility]

## 2018-08-17 ENCOUNTER — Encounter (HOSPITAL_COMMUNITY)
Admission: AD | Disposition: A | Discharge status: Home Health | Payer: Self-pay | Source: Other Acute Inpatient Hospital | Attending: Family Medicine

## 2018-08-17 ENCOUNTER — Inpatient Hospital Stay (HOSPITAL_COMMUNITY): Payer: Medicare Other

## 2018-08-17 DIAGNOSIS — I340 Nonrheumatic mitral (valve) insufficiency: Secondary | ICD-10-CM

## 2018-08-17 DIAGNOSIS — I5032 Chronic diastolic (congestive) heart failure: Secondary | ICD-10-CM

## 2018-08-17 DIAGNOSIS — R740 Nonspecific elevation of levels of transaminase and lactic acid dehydrogenase [LDH]: Secondary | ICD-10-CM

## 2018-08-17 DIAGNOSIS — I361 Nonrheumatic tricuspid (valve) insufficiency: Secondary | ICD-10-CM

## 2018-08-17 LAB — CBC WITH DIFFERENTIAL/PLATELET
Abs Immature Granulocytes: 0.15 10*3/uL — ABNORMAL HIGH (ref 0.00–0.07)
Basophils Absolute: 0 10*3/uL (ref 0.0–0.1)
Basophils Relative: 0 %
Eosinophils Absolute: 0 10*3/uL (ref 0.0–0.5)
Eosinophils Relative: 0 %
HCT: 33.2 % — ABNORMAL LOW (ref 36.0–46.0)
Hemoglobin: 10.3 g/dL — ABNORMAL LOW (ref 12.0–15.0)
Immature Granulocytes: 1 %
Lymphocytes Relative: 9 %
Lymphs Abs: 1.3 10*3/uL (ref 0.7–4.0)
MCH: 28.3 pg (ref 26.0–34.0)
MCHC: 31 g/dL (ref 30.0–36.0)
MCV: 91.2 fL (ref 80.0–100.0)
Monocytes Absolute: 0.9 10*3/uL (ref 0.1–1.0)
Monocytes Relative: 7 %
Neutro Abs: 11.3 10*3/uL — ABNORMAL HIGH (ref 1.7–7.7)
Neutrophils Relative %: 83 %
Platelets: 217 10*3/uL (ref 150–400)
RBC: 3.64 MIL/uL — ABNORMAL LOW (ref 3.87–5.11)
RDW: 13.4 % (ref 11.5–15.5)
WBC: 13.7 10*3/uL — AB (ref 4.0–10.5)
nRBC: 0 % (ref 0.0–0.2)

## 2018-08-17 LAB — URINALYSIS, ROUTINE W REFLEX MICROSCOPIC
BACTERIA UA: NONE SEEN
Bilirubin Urine: NEGATIVE
Glucose, UA: NEGATIVE mg/dL
Hgb urine dipstick: NEGATIVE
Ketones, ur: 20 mg/dL — AB
Leukocytes,Ua: NEGATIVE
Nitrite: NEGATIVE
PROTEIN: 30 mg/dL — AB
Specific Gravity, Urine: 1.04 — ABNORMAL HIGH (ref 1.005–1.030)
pH: 5 (ref 5.0–8.0)

## 2018-08-17 LAB — COMPREHENSIVE METABOLIC PANEL
ALT: 84 U/L — ABNORMAL HIGH (ref 0–44)
AST: 68 U/L — ABNORMAL HIGH (ref 15–41)
Albumin: 2.2 g/dL — ABNORMAL LOW (ref 3.5–5.0)
Alkaline Phosphatase: 114 U/L (ref 38–126)
Anion gap: 8 (ref 5–15)
BUN: 26 mg/dL — ABNORMAL HIGH (ref 8–23)
CHLORIDE: 114 mmol/L — AB (ref 98–111)
CO2: 15 mmol/L — ABNORMAL LOW (ref 22–32)
Calcium: 7.6 mg/dL — ABNORMAL LOW (ref 8.9–10.3)
Creatinine, Ser: 1.2 mg/dL — ABNORMAL HIGH (ref 0.44–1.00)
GFR calc Af Amer: 45 mL/min — ABNORMAL LOW (ref >60)
GFR calc non Af Amer: 39 mL/min — ABNORMAL LOW (ref >60)
Glucose, Bld: 75 mg/dL (ref 70–99)
POTASSIUM: 4.1 mmol/L (ref 3.5–5.1)
SODIUM: 137 mmol/L (ref 135–145)
Total Bilirubin: 1.6 mg/dL — ABNORMAL HIGH (ref 0.3–1.2)
Total Protein: 5.2 g/dL — ABNORMAL LOW (ref 6.5–8.1)

## 2018-08-17 LAB — ECHOCARDIOGRAM LIMITED
Height: 57.008 in
Weight: 1862.45 oz

## 2018-08-17 LAB — MAGNESIUM: Magnesium: 1.9 mg/dL (ref 1.7–2.4)

## 2018-08-17 SURGERY — ERCP, WITH INTERVENTION IF INDICATED
Anesthesia: General

## 2018-08-17 MED ORDER — FLECAINIDE ACETATE 50 MG PO TABS
50.0000 mg | ORAL_TABLET | Freq: Two times a day (BID) | ORAL | Status: DC
  Administered 2018-08-17 – 2018-08-18 (×3): 50 mg via ORAL
  Filled 2018-08-17 (×4): qty 1

## 2018-08-17 MED ORDER — ACETAMINOPHEN 325 MG PO TABS
650.0000 mg | ORAL_TABLET | Freq: Four times a day (QID) | ORAL | Status: DC | PRN
  Administered 2018-08-17 – 2018-08-18 (×3): 650 mg via ORAL
  Filled 2018-08-17 (×3): qty 2

## 2018-08-17 MED ORDER — AMIODARONE IV BOLUS ONLY 150 MG/100ML
150.0000 mg | Freq: Once | INTRAVENOUS | Status: AC
  Administered 2018-08-17: 150 mg via INTRAVENOUS
  Filled 2018-08-17: qty 100

## 2018-08-17 MED ORDER — METOPROLOL TARTRATE 12.5 MG HALF TABLET
12.5000 mg | ORAL_TABLET | Freq: Three times a day (TID) | ORAL | Status: DC
  Administered 2018-08-17 – 2018-08-19 (×6): 12.5 mg via ORAL
  Filled 2018-08-17 (×6): qty 1

## 2018-08-17 MED ORDER — AMIODARONE LOAD VIA INFUSION: 150.0000 mg | Freq: Once | INTRAVENOUS | Status: DC

## 2018-08-17 NOTE — Progress Notes (Signed)
[facility] Progress Note     Subjective: CC: choledocholithiasis Patient reports mild abdominal pain in RUQ - "feels more like soreness". Denies nausea and tolerated some clears. Family at bedside with a lot of questions.   Objective: Vital signs in last 24 hours: Temp:  [97.7 F (36.5 C)-98.4 F (36.9 C)] 97.8 F (36.6 C) (03/16 0744) Pulse Rate:  [111-142] 111 (03/16 0600) Resp:  [13-32] 15 (03/16 0600) BP: (79-137)/(52-103) 100/65 (03/16 0903) SpO2:  [93 %-100 %] 97 % (03/16 0600) Weight:  [52.8 kg] 52.8 kg (03/16 0424) Last BM Date: (PTA)  Intake/Output from previous day: 03/15 0701 - 03/16 0700 In: 3637 [I.V.:1468.6; IV Piggyback:2163.4] Out: 375 [Urine:275; Drains:100] Intake/Output this shift: No intake/output data recorded.  PE: Gen:  Alert, NAD, pleasant Card:  HR in the 130s Pulm:  Normal effort, clear to auscultation bilaterally Abd: Soft, mildly TTP in RUQ, non-distended, +BS, no HSM, drain in RUQ with bilious output Skin: warm and dry, no rashes    Lab Results:  Recent Labs    08/16/18 0938 08/17/18 0324  WBC 14.8* 13.7*  HGB 10.0* 10.3*  HCT 31.2* 33.2*  PLT 204 217   BMET Recent Labs    08/16/18 2038 08/17/18 0324  NA 135 137  K 3.8 4.1  CL 109 114*  CO2 20* 15*  GLUCOSE 77 75  BUN 24* 26*  CREATININE 1.23* 1.20*  CALCIUM 7.6* 7.6*   PT/INR Recent Labs    08/15/18 2040  LABPROT 16.4*  INR 1.3*   CMP     Component Value Date/Time   NA 137 08/17/2018 0324   NA 135 09/16/2014 0404   K 4.1 08/17/2018 0324   K 3.8 09/16/2014 0404   CL 114 (H) 08/17/2018 0324   CL 104 09/16/2014 0404   CO2 15 (L) 08/17/2018 0324   CO2 26 09/16/2014 0404   GLUCOSE 75 08/17/2018 0324   GLUCOSE 107 (H) 09/16/2014 0404   BUN 26 (H) 08/17/2018 0324   BUN 19 09/16/2014 0404   CREATININE 1.20 (H) 08/17/2018 0324   CREATININE 0.91 09/16/2014 0404   CALCIUM 7.6 (L) 08/17/2018 0324   CALCIUM 8.7 (L) 09/16/2014 0404   PROT 5.2 (L)  08/17/2018 0324   PROT 6.1 (L) 09/16/2014 0404   ALBUMIN 2.2 (L) 08/17/2018 0324   ALBUMIN 3.6 09/16/2014 0404   AST 68 (H) 08/17/2018 0324   AST 20 09/16/2014 0404   ALT 84 (H) 08/17/2018 0324   ALT 15 09/16/2014 0404   ALKPHOS 114 08/17/2018 0324   ALKPHOS 37 (L) 09/16/2014 0404   BILITOT 1.6 (H) 08/17/2018 0324   BILITOT 0.6 09/16/2014 0404   GFRNONAA 39 (L) 08/17/2018 0324   GFRNONAA 56 (L) 09/16/2014 0404   GFRAA 45 (L) 08/17/2018 0324   GFRAA >60 09/16/2014 0404   Lipase     Component Value Date/Time   LIPASE 107 (H) 08/14/2018 2055       Studies/Results: Ir Perc Cholecystostomy  Result Date: 08/16/2018 INDICATION: Concern for occlusive choledocholithiasis, now with hemodynamic instability and poor candidate for endoscopy. After stabilization in the ICU, request made for placement of a percutaneous biliary drainage catheter for infection source control purposes. EXAM: ULTRASOUND AND FLUOROSCOPIC-GUIDED CHOLECYSTOSTOMY TUBE PLACEMENT COMPARISON:  MRCP - 08/15/2018; 07/06/2018; CT abdomen and pelvis - 08/14/2018 MEDICATIONS: The patient is currently admitted to the hospital and on intravenous antibiotics. Antibiotics were administered within an appropriate time frame prior to skin puncture. ANESTHESIA/SEDATION: Moderate (conscious) sedation was employed during this procedure. A  total of Versed 1.5 mg and Fentanyl 50 mcg was administered intravenously. Moderate Sedation Time: 63 minutes. The patient's level of consciousness and vital signs were monitored continuously by radiology nursing throughout the procedure under my direct supervision. CONTRAST:  16mL OMNIPAQUE IOHEXOL 300 MG/ML SOLN - administered into the biliary tree, hepatic parenchyma and gallbladder fossa. FLUOROSCOPY TIME:  13 minutes, 48 seconds (55 mGy) COMPLICATIONS: None immediate. PROCEDURE: Informed written consent was obtained from the patient and the patient's family after a discussion of the risks, benefits and  alternatives to treatment. Questions regarding the procedure were encouraged and answered. A timeout was performed prior to the initiation of the procedure. The right upper abdominal quadrant was prepped and draped in the usual sterile fashion, and a sterile drape was applied covering the operative field. Maximum barrier sterile technique with sterile gowns and gloves were used for the procedure. A timeout was performed prior to the initiation of the procedure. Local anesthesia was provided with 1% lidocaine with epinephrine. Ultrasound evaluation of the abdomen was negative for any significant dilatation of the peripheral aspect of the biliary tree. Note is made of marked gallbladder wall thickening as well as the presence of known cholelithiasis and gallbladder sludge. Multiple attempts were made to cannulate the biliary tree both from right and left-sided approaches. At one point, there is minimal opacification of the central aspect of the right biliary tree with eventual opacification of the common bile and cystic ducts. Given patient's hemodynamic instability as well as lack of any significant intrahepatic biliary ductal dilatation and patency of the cystic duct the decision was made to proceed with cholecystostomy tube placement. As such, utilizing a transhepatic approach, a 22 gauge needle was advanced into the gallbladder under direct ultrasound guidance. An ultrasound image was saved for documentation purposes. Appropriate intraluminal puncture was confirmed with the efflux of bile and advancement of an 0.018 wire into the gallbladder lumen. The needle was exchanged for an Schleicher set. A small amount of contrast was injected to confirm appropriate intraluminal positioning. Over a short Amplatz wire, a 10.2-French Cook cholecystomy tube was advanced into the gallbladder fossa, coiled and locked. Bile was aspirated and a small amount of contrast was injected as several post procedural spot radiographic  images were obtained in various obliquities. The catheter was secured to the skin with suture, connected to a drainage bag and a dressing was placed. The patient tolerated the procedure well without immediate post procedural complication. FINDINGS: Sonographic evaluation of the abdomen is negative for any significant intrahepatic biliary duct dilatation. Note is made of gallbladder wall thickening pericholecystic fluid as well as multiple radiopaque gallstones or biliary sludge. Limited percutaneous cholangiogram demonstrates opacification of a moderately dilated common bile duct as well as opacification of the cystic duct. There is no definitive passage of contrast to the distal aspect of the CBD. Given lack of any significant intrahepatic biliary ductal dilatation, as well as patency of the cystic duct and abnormal appearance of the gallbladder, the decision was made to proceed with cholecystostomy tube placement. After image guided placement, the 10 French percutaneous cholecystostomy tube is appropriately positioned with end coiled and locked within the lumen of the gallbladder. IMPRESSION: 1. Attempted though ultimately unsuccessful placement of an internal/external biliary drainage catheter secondary to lack of any significant dilatation of the intrahepatic biliary system. 2. Given abnormal sonographic appearance of the gallbladder as well as limited percutaneous cholangiogram demonstrating patency of the cystic duct, the decision was made to proceed with image guided cholecystostomy  tube placement for infection source control purposes. 3. Successful ultrasound and fluoroscopic guided placement of a 10.2 French cholecystostomy tube. PLAN: - Continued resuscitative measures by the ICU staff. - Maintain chole tube to gravity bag. - Obtain daily CMP to evaluate bilirubin trend. - Diagnostic percutaneous cholangiogram could be performed via the existing cholecystostomy tube following the resolution of acute  symptoms. Above findings discussed with Dr. Nelda Marseille at the time of procedure completion. Electronically Signed   By: Sandi Mariscal M.D.   On: 08/16/2018 19:51    Anti-infectives: Anti-infectives (From admission, onward)   Start     Dose/Rate Route Frequency Ordered Stop   08/16/18 1740  vancomycin (VANCOCIN) 1-5 GM/200ML-% IVPB  Status:  Discontinued    Note to Pharmacy:  Desiree Hane   : cabinet override      08/16/18 1740 08/16/18 1824   08/15/18 2200  cefTRIAXone (ROCEPHIN) 2 g in sodium chloride 0.9 % 100 mL IVPB  Status:  Discontinued     2 g 200 mL/hr over 30 Minutes Intravenous Every 24 hours 08/15/18 1957 08/15/18 2005   08/15/18 2200  piperacillin-tazobactam (ZOSYN) IVPB 3.375 g     3.375 g 12.5 mL/hr over 240 Minutes Intravenous Every 8 hours 08/15/18 2036     08/15/18 2000  metroNIDAZOLE (FLAGYL) IVPB 500 mg  Status:  Discontinued     500 mg 100 mL/hr over 60 Minutes Intravenous Every 8 hours 08/15/18 1957 08/15/18 2005       Assessment/Plan A. Fib with RVR - improving, per primary team  GERD Diastolic CHF with severe MVR  Possible Acute cholecystitis Choledocholithiasis - s/p IR cholecystostomy tube placement 3/15 - bilious drainage - GI recommending cholangiogram in the next 24-48 h, possible ERCP pending results of this - WBC 13.7, afebrile - continue abx - LFTs and TBili all down-trending - will discuss with MD but likely would plan for interval cholecystectomy in 6-8 weeks if patient cleared from a medical standpoint  FEN: CLD VTE: SCDs ID: rocephin/flagyl 3/14; zosyn 3/14>>  LOS: 2 days    Brigid Re , [facility] 08/17/2018, 10:14 AM Pager: [place]: [phone]

## 2018-08-17 NOTE — Progress Notes (Addendum)
Progress Note  Patient Name: Kylie May Date of Encounter: 08/17/2018  Primary Cardiologist: Ida Rogue, MD   Subjective   Very pleasant elderly lady, very hard of hearing but alert, oriented and aware of her situation. She denies any chest discomfort, shortness of breath, palpitations or lightheadedness. She only has some mild tenderness at site of cholecystostomy tube. She is also a little hungry but she does not want to eat prematurely and mess up her current therapy plan.   Inpatient Medications    Scheduled Meds: . mouth rinse  15 mL Mouth Rinse BID  . metoprolol tartrate  12.5 mg Oral Q8H  . sodium chloride flush  5 mL Intracatheter Q8H   Continuous Infusions: . sodium chloride 125 mL/hr at 08/17/18 0600  . sodium chloride    . sodium chloride    . piperacillin-tazobactam (ZOSYN)  IV 12.5 mL/hr at 08/17/18 0600   PRN Meds: acetaminophen, diphenhydrAMINE, white petrolatum   Vital Signs    Vitals:   08/17/18 0500 08/17/18 0600 08/17/18 0744 08/17/18 0903  BP:  (!) 85/65  100/65  Pulse: (!) 119 (!) 111    Resp: (!) 24 15    Temp:   97.8 F (36.6 C)   TempSrc:   Oral   SpO2: 99% 97%    Weight:      Height:        Intake/Output Summary (Last 24 hours) at 08/17/2018 1113 Last data filed at 08/17/2018 0400 Gross per 24 hour  Intake 2104.21 ml  Output 375 ml  Net 1729.21 ml   Last 3 Weights 08/17/2018 08/16/2018 08/15/2018  Weight (lbs) 116 lb 6.5 oz 111 lb 15.9 oz 105 lb 14.4 oz  Weight (kg) 52.8 kg 50.8 kg 48.036 kg      Telemetry    Atrial fibrillation 120's-130's - Personally Reviewed  ECG    No new tracings - Personally Reviewed  Physical Exam   GEN: Very pleasant, elderly female. No acute distress.   Neck: No JVD Cardiac: Irregularly irregular rhythm, no murmurs, rubs, or gallops.  Respiratory: Clear to auscultation bilaterally. GI: Soft, nontender, non-distended  MS: No edema; No deformity. Neuro:  Nonfocal  Psych: Normal affect   Labs    Chemistry Recent Labs  Lab 08/15/18 2040 08/16/18 0428 08/16/18 0938 08/16/18 2038 08/17/18 0324  NA 133* 135  --  135 137  K 3.8 3.8  --  3.8 4.1  CL 103 106  --  109 114*  CO2 21* 19*  --  20* 15*  GLUCOSE 99 88  --  77 75  BUN 17 16  --  24* 26*  CREATININE 1.36* 1.17*  --  1.23* 1.20*  CALCIUM 7.9* 8.0*  --  7.6* 7.6*  PROT 5.6*  --  5.2*  --  5.2*  ALBUMIN 2.7*  --  2.5*  --  2.2*  AST 180*  --  106*  --  68*  ALT 153*  --  117*  --  84*  ALKPHOS 136*  --  128*  --  114  BILITOT 3.6*  --  2.6*  --  1.6*  GFRNONAA 33* 40*  --  38* 39*  GFRAA 39* 46*  --  43* 45*  ANIONGAP 9 10  --  6 8     Hematology Recent Labs  Lab 08/15/18 2040 08/16/18 0938 08/17/18 0324  WBC 15.0* 14.8* 13.7*  RBC 3.40* 3.60* 3.64*  HGB 9.6* 10.0* 10.3*  HCT 29.9* 31.2* 33.2*  MCV  87.9 86.7 91.2  MCH 28.2 27.8 28.3  MCHC 32.1 32.1 31.0  RDW 13.1 13.2 13.4  PLT 203 204 217    Cardiac Enzymes Recent Labs  Lab 08/14/18 2055  TROPONINI <0.03   No results for input(s): TROPIPOC in the last 168 hours.   BNP Recent Labs  Lab 08/15/18 2040  BNP 1,005.7*     DDimer No results for input(s): DDIMER in the last 168 hours.   Radiology    Ir Perc Cholecystostomy  Result Date: 08/16/2018 INDICATION: Concern for occlusive choledocholithiasis, now with hemodynamic instability and poor candidate for endoscopy. After stabilization in the ICU, request made for placement of a percutaneous biliary drainage catheter for infection source control purposes. EXAM: ULTRASOUND AND FLUOROSCOPIC-GUIDED CHOLECYSTOSTOMY TUBE PLACEMENT COMPARISON:  MRCP - 08/15/2018; 07/06/2018; CT abdomen and pelvis - 08/14/2018 MEDICATIONS: The patient is currently admitted to the hospital and on intravenous antibiotics. Antibiotics were administered within an appropriate time frame prior to skin puncture. ANESTHESIA/SEDATION: Moderate (conscious) sedation was employed during this procedure. A total of Versed 1.5  mg and Fentanyl 50 mcg was administered intravenously. Moderate Sedation Time: 63 minutes. The patient's level of consciousness and vital signs were monitored continuously by radiology nursing throughout the procedure under my direct supervision. CONTRAST:  54mL OMNIPAQUE IOHEXOL 300 MG/ML SOLN - administered into the biliary tree, hepatic parenchyma and gallbladder fossa. FLUOROSCOPY TIME:  13 minutes, 48 seconds (55 mGy) COMPLICATIONS: None immediate. PROCEDURE: Informed written consent was obtained from the patient and the patient's family after a discussion of the risks, benefits and alternatives to treatment. Questions regarding the procedure were encouraged and answered. A timeout was performed prior to the initiation of the procedure. The right upper abdominal quadrant was prepped and draped in the usual sterile fashion, and a sterile drape was applied covering the operative field. Maximum barrier sterile technique with sterile gowns and gloves were used for the procedure. A timeout was performed prior to the initiation of the procedure. Local anesthesia was provided with 1% lidocaine with epinephrine. Ultrasound evaluation of the abdomen was negative for any significant dilatation of the peripheral aspect of the biliary tree. Note is made of marked gallbladder wall thickening as well as the presence of known cholelithiasis and gallbladder sludge. Multiple attempts were made to cannulate the biliary tree both from right and left-sided approaches. At one point, there is minimal opacification of the central aspect of the right biliary tree with eventual opacification of the common bile and cystic ducts. Given patient's hemodynamic instability as well as lack of any significant intrahepatic biliary ductal dilatation and patency of the cystic duct the decision was made to proceed with cholecystostomy tube placement. As such, utilizing a transhepatic approach, a 22 gauge needle was advanced into the gallbladder  under direct ultrasound guidance. An ultrasound image was saved for documentation purposes. Appropriate intraluminal puncture was confirmed with the efflux of bile and advancement of an 0.018 wire into the gallbladder lumen. The needle was exchanged for an Colwell set. A small amount of contrast was injected to confirm appropriate intraluminal positioning. Over a short Amplatz wire, a 10.2-French Cook cholecystomy tube was advanced into the gallbladder fossa, coiled and locked. Bile was aspirated and a small amount of contrast was injected as several post procedural spot radiographic images were obtained in various obliquities. The catheter was secured to the skin with suture, connected to a drainage bag and a dressing was placed. The patient tolerated the procedure well without immediate post procedural complication. FINDINGS: Sonographic  evaluation of the abdomen is negative for any significant intrahepatic biliary duct dilatation. Note is made of gallbladder wall thickening pericholecystic fluid as well as multiple radiopaque gallstones or biliary sludge. Limited percutaneous cholangiogram demonstrates opacification of a moderately dilated common bile duct as well as opacification of the cystic duct. There is no definitive passage of contrast to the distal aspect of the CBD. Given lack of any significant intrahepatic biliary ductal dilatation, as well as patency of the cystic duct and abnormal appearance of the gallbladder, the decision was made to proceed with cholecystostomy tube placement. After image guided placement, the 10 French percutaneous cholecystostomy tube is appropriately positioned with end coiled and locked within the lumen of the gallbladder. IMPRESSION: 1. Attempted though ultimately unsuccessful placement of an internal/external biliary drainage catheter secondary to lack of any significant dilatation of the intrahepatic biliary system. 2. Given abnormal sonographic appearance of the  gallbladder as well as limited percutaneous cholangiogram demonstrating patency of the cystic duct, the decision was made to proceed with image guided cholecystostomy tube placement for infection source control purposes. 3. Successful ultrasound and fluoroscopic guided placement of a 10.2 French cholecystostomy tube. PLAN: - Continued resuscitative measures by the ICU staff. - Maintain chole tube to gravity bag. - Obtain daily CMP to evaluate bilirubin trend. - Diagnostic percutaneous cholangiogram could be performed via the existing cholecystostomy tube following the resolution of acute symptoms. Above findings discussed with Dr. Nelda Marseille at the time of procedure completion. Electronically Signed   By: Sandi Mariscal M.D.   On: 08/16/2018 19:51    Cardiac Studies   Echocardiogram in process  Per Dr. Donivan Scull office note: Echocardiogram 06/09/2014 shows normal LV function, severely dilated left atrium, severe MR, moderate to severe TR   Patient Profile     85 y.o. female  with a hx of paroxysmal asymptomatic atrial fibrillation on Eliquis, mild diastolic CHF, HTN, hiatal hernia, osteopenia, degenerative joint disease, scoliosis, hearing loss and frequent falls who is being seen today for the evaluation of sinus tachycardia thought to be SVT.   Assessment & Plan    Atrial fibrillation with RVR -Hx of PAF controlled on low dose carvedilol and flecainide. On reduced dose Eliquis at home for stroke risk reduction with CHA2DS2/VAS Stroke Risk Score of 4 (CHF, age (4), female). Currently Eliquis on hold for gallbladder procedures. Flecainide is on hold.  -Pt continues in afib with rates 120's-130's.  -Pt previously on amiodarone in the past, stopped due to elevated TSH.  -Pt got a dose of amiodarone 150 mg IV at 0320 this am. She was on her home carvedilol 3.125 mg but BP low, last dose this am now stopped.  Per IM plan to start metoprolol 12.5 mg q 8h ordered to start at 1400. BP soft in the 80's-90's, occ  isolated high BP.  -Pt is asymptomatic. She has no awareness of being in afib.  -If she becomes symptomatic and rate does not come down or BP does not tolerate beta blocker, could consider resuming flecainide or adding amiodarone (however, pt had elevated LFTS with cholecystitis, now improved)  Abd pain, possible acute cholecystitis/choledocholithiasis, Sepsis -S/P IR cholecystostomy tube placement 08/16/18. GI recommending cholangiogram in the next 24-48 h, possible ERCP pending results of this. Possible interval cholecystectomy in 6-8 weeks if pt cleared medically.  -On IV Zosyn.  -Pt required norepinephrine briefly yesterday.  -LFTs were elevated, now improving.   Chronic diastolic CHF with severe MR and moderate to severe TR -Per Dr. Donivan Scull office note:  Echocardiogram 06/09/2014 shows normal LV function, severely dilated left atrium, severe MR, moderate to severe TR -BNP 1005.7 -Pt appears euvolemic. She is receiving IV fluids for sepsis and low BP.  -Echo has just been done, pending results.   Hypothyroid -TSH 9.124. -TSH had been up to 19 while on amiodarone in the past, down to 16 off amio. If we need to start amio for afib, would need to follow up on thyroid function and provide thyroid replacement as needed.        For questions or updates, please contact [place] Please consult [email or web address] for contact info under     Signed, Daune Perch, NP  08/17/2018, 11:13 AM    I have seen and examined the patient along with Daune Perch, NP.  I have reviewed the chart, notes and new data.  I agree with NP's note.  Key new complaints: appears comfortable at rest, extremely hard of hearing Key examination changes: remains in RVR (130s) despite transition from carvedilol to metoprolol; BP is low, precluding higher doses of rate control meds Key new findings / data: echo shows normal LVEF, moderate biatrial dilation.  PLAN: Unable to achieve rate control due to low BP.  Options include IV amiodarone (which I believe is preferable as at least it would contribute to rate control) versus resuming flecainide in a hope that she will return to NSR. The risks of flecainide are actually statistically higher in this nonagenarian, but the family is afraid to try amiodarone due to previous "side-effects". The only problem I identified with amiodarone in the past was hypothyroidism, which could be managed in a straightforward fashion. TEE-guided DCCV would be another option, obviously a path with some concern at her age (and she would an antiarrhythmic for maintenance anyway). Despite my detailed explanation, the family remains very wary of amiodarone and prefers restarting flecainide, which she has used successfully until the current acute illness.  Sanda Klein, MD, [place] [phone] 08/17/2018, 6:55 PM

## 2018-08-17 NOTE — Progress Notes (Signed)
Subjective: The patient was seen and examined at bedside in presence of her grandson. She is very hard of hearing. She remained hypotensive throughout the night and HR was in 140s, needed Coreg and amiodarone.  Objective: Vital signs in last 24 hours: Temp:  [97.7 F (36.5 C)-98.4 F (36.9 C)] 97.8 F (36.6 C) (03/16 0744) Pulse Rate:  [69-142] 111 (03/16 0600) Resp:  [13-32] 15 (03/16 0600) BP: (79-137)/(52-103) 85/65 (03/16 0600) SpO2:  [93 %-100 %] 97 % (03/16 0600) Weight:  [52.8 kg] 52.8 kg (03/16 0424) Weight change: 4.764 kg Last BM Date: (PTA)  XB:LTJQZES, frail GENERAL:not in distress ABDOMEN:cholecystostomy tube in place draining bile, normoactive bowel sounds, nontender EXTREMITIES:no deformity  Lab Results: Results for orders placed or performed during the hospital encounter of 08/15/18 (from the past 48 hour(s))  Comprehensive metabolic panel     Status: Abnormal   Collection Time: 08/15/18  8:40 PM  Result Value Ref Range   Sodium 133 (L) 135 - 145 mmol/L   Potassium 3.8 3.5 - 5.1 mmol/L   Chloride 103 98 - 111 mmol/L   CO2 21 (L) 22 - 32 mmol/L   Glucose, Bld 99 70 - 99 mg/dL   BUN 17 8 - 23 mg/dL   Creatinine, Ser 1.36 (H) 0.44 - 1.00 mg/dL   Calcium 7.9 (L) 8.9 - 10.3 mg/dL   Total Protein 5.6 (L) 6.5 - 8.1 g/dL   Albumin 2.7 (L) 3.5 - 5.0 g/dL   AST 180 (H) 15 - 41 U/L   ALT 153 (H) 0 - 44 U/L   Alkaline Phosphatase 136 (H) 38 - 126 U/L   Total Bilirubin 3.6 (H) 0.3 - 1.2 mg/dL   GFR calc non Af Amer 33 (L) >60 mL/min   GFR calc Af Amer 39 (L) >60 mL/min   Anion gap 9 5 - 15    Comment: Performed at [facility], [address], [place], [place] [address]  Magnesium     Status: Abnormal   Collection Time: 08/15/18  8:40 PM  Result Value Ref Range   Magnesium 1.1 (L) 1.7 - 2.4 mg/dL    Comment: Performed at [facility] [address], [place], [place] [address]  CBC WITH DIFFERENTIAL     Status: Abnormal   Collection Time: 08/15/18   8:40 PM  Result Value Ref Range   WBC 15.0 (H) 4.0 - 10.5 K/uL    Comment: REPEATED TO VERIFY   RBC 3.40 (L) 3.87 - 5.11 MIL/uL   Hemoglobin 9.6 (L) 12.0 - 15.0 g/dL   HCT 29.9 (L) 36.0 - 46.0 %   MCV 87.9 80.0 - 100.0 fL   MCH 28.2 26.0 - 34.0 pg   MCHC 32.1 30.0 - 36.0 g/dL   RDW 13.1 11.5 - 15.5 %   Platelets 203 150 - 400 K/uL   nRBC 0.0 0.0 - 0.2 %   Neutrophils Relative % 86 %   Neutro Abs 12.9 (H) 1.7 - 7.7 K/uL   Lymphocytes Relative 7 %   Lymphs Abs 1.0 0.7 - 4.0 K/uL   Monocytes Relative 6 %   Monocytes Absolute 0.9 0.1 - 1.0 K/uL   Eosinophils Relative 0 %   Eosinophils Absolute 0.0 0.0 - 0.5 K/uL   Basophils Relative 0 %   Basophils Absolute 0.0 0.0 - 0.1 K/uL   Immature Granulocytes 1 %   Abs Immature Granulocytes 0.21 (H) 0.00 - 0.07 K/uL    Comment: Performed at [facility], [address], [place], [place] [address]  Protime-INR     Status: Abnormal   Collection Time: 08/15/18  8:40 PM  Result Value Ref Range   Prothrombin Time 16.4 (H) 11.4 - 15.2 seconds   INR 1.3 (H) 0.8 - 1.2    Comment: (NOTE) INR goal varies based on device and disease states. Performed at [facility], [place] [address], [place], [place] [address]   Brain natriuretic peptide     Status: Abnormal   Collection Time: 08/15/18  8:40 PM  Result Value Ref Range   B Natriuretic Peptide 1,005.7 (H) 0.0 - 100.0 pg/mL    Comment: Performed at [facility] [address], [place], [place] [address]  Glucose, capillary     Status: None   Collection Time: 08/15/18 10:03 PM  Result Value Ref Range   Glucose-Capillary 99 70 - 99 mg/dL  Lactic acid, plasma     Status: None   Collection Time: 08/15/18 11:58 PM  Result Value Ref Range   Lactic Acid, Venous 1.0 0.5 - 1.9 mmol/L    Comment: Performed at [facility] [address] [place], [place] [address]  Glucose, capillary     Status: None   Collection Time: 08/16/18  4:13 AM  Result Value Ref Range    Glucose-Capillary 82 70 - 99 mg/dL  Basic metabolic panel     Status: Abnormal   Collection Time: 08/16/18  4:28 AM  Result Value Ref Range   Sodium 135 135 - 145 mmol/L   Potassium 3.8 3.5 - 5.1 mmol/L   Chloride 106 98 - 111 mmol/L   CO2 19 (L) 22 - 32 mmol/L   Glucose, Bld 88 70 - 99 mg/dL   BUN 16 8 - 23 mg/dL   Creatinine, Ser 1.17 (H) 0.44 - 1.00 mg/dL   Calcium 8.0 (L) 8.9 - 10.3 mg/dL   GFR calc non Af Amer 40 (L) >60 mL/min   GFR calc Af Amer 46 (L) >60 mL/min   Anion gap 10 5 - 15    Comment: Performed at [facility], [place] [address] [place], [place] [address]  Magnesium     Status: None   Collection Time: 08/16/18  4:28 AM  Result Value Ref Range   Magnesium 2.0 1.7 - 2.4 mg/dL    Comment: Performed at [facility] [address] [place], [place] [address]  Lactic acid, plasma     Status: None   Collection Time: 08/16/18  4:29 AM  Result Value Ref Range   Lactic Acid, Venous 0.9 0.5 - 1.9 mmol/L    Comment: Performed at [facility] [address], [place], [place] [address]  Hepatic function panel     Status: Abnormal   Collection Time: 08/16/18  9:38 AM  Result Value Ref Range   Total Protein 5.2 (L) 6.5 - 8.1 g/dL   Albumin 2.5 (L) 3.5 - 5.0 g/dL   AST 106 (H) 15 - 41 U/L   ALT 117 (H) 0 - 44 U/L   Alkaline Phosphatase 128 (H) 38 - 126 U/L   Total Bilirubin 2.6 (H) 0.3 - 1.2 mg/dL   Bilirubin, Direct 1.8 (H) 0.0 - 0.2 mg/dL   Indirect Bilirubin 0.8 0.3 - 0.9 mg/dL    Comment: Performed at [facility], [place] [address] [place], [place] [address]  CBC with Differential/Platelet     Status: Abnormal   Collection Time: 08/16/18  9:38 AM  Result Value Ref Range   WBC 14.8 (H) 4.0 - 10.5 K/uL   RBC 3.60 (L) 3.87 - 5.11 MIL/uL   Hemoglobin 10.0 (L) 12.0 - 15.0 g/dL   HCT 31.2 (L) 36.0 - 46.0 %   MCV 86.7 80.0 - 100.0 fL   MCH 27.8 26.0 - 34.0 pg   MCHC 32.1 30.0 - 36.0 g/dL   RDW 13.2 11.5 - 15.5 %   Platelets 204 150 - 400 K/uL    nRBC 0.0 0.0 - 0.2 %   Neutrophils Relative % 87 %   Neutro Abs 13.0 (H) 1.7 - 7.7 K/uL   Lymphocytes Relative 6 %   Lymphs Abs 0.9 0.7 - 4.0 K/uL   Monocytes Relative 5 %   Monocytes Absolute 0.7 0.1 - 1.0 K/uL   Eosinophils Relative 0 %   Eosinophils Absolute 0.0 0.0 - 0.5 K/uL   Basophils Relative 0 %   Basophils Absolute 0.0 0.0 - 0.1 K/uL   Immature Granulocytes 2 %   Abs Immature Granulocytes 0.22 (H) 0.00 - 0.07 K/uL    Comment: Performed at [facility], [address] [place], [place] [address]  Culture, blood (Routine X 2) w Reflex to ID Panel     Status: None (Preliminary result)   Collection Time: 08/16/18  3:44 PM  Result Value Ref Range   Specimen Description BLOOD LEFT HAND    Special Requests      AEROBIC BOTTLE ONLY Blood Culture adequate volume Performed at [facility] [address], [place], [place] [address]    Culture PENDING    Report Status PENDING   Glucose, capillary     Status: None   Collection Time: 08/16/18  4:10 PM  Result Value Ref Range   Glucose-Capillary 76 70 - 99 mg/dL  MRSA PCR Screening     Status: None   Collection Time: 08/16/18  4:53 PM  Result Value Ref Range   MRSA by PCR NEGATIVE NEGATIVE    Comment:        The GeneXpert MRSA Assay (FDA approved for NASAL specimens only), is one component of a comprehensive MRSA colonization surveillance program. It is not intended to diagnose MRSA infection nor to guide or monitor treatment for MRSA infections. Performed at [name], [place] [address], [place], [place] [address]   Urinalysis, Routine w reflex microscopic     Status: Abnormal   Collection Time: 08/16/18  8:37 PM  Result Value Ref Range   Color, Urine AMBER (A) YELLOW    Comment: BIOCHEMICALS MAY BE AFFECTED BY COLOR   APPearance HAZY (A) CLEAR   Specific Gravity, Urine 1.040 (H) 1.005 - 1.030   pH 5.0 5.0 - 8.0   Glucose, UA NEGATIVE NEGATIVE mg/dL   Hgb urine dipstick NEGATIVE NEGATIVE    Bilirubin Urine NEGATIVE NEGATIVE   Ketones, ur 20 (A) NEGATIVE mg/dL   Protein, ur 30 (A) NEGATIVE mg/dL   Nitrite NEGATIVE NEGATIVE   Leukocytes,Ua NEGATIVE NEGATIVE   RBC / HPF 0-5 0 - 5 RBC/hpf   WBC, UA 0-5 0 - 5 WBC/hpf   Bacteria, UA NONE SEEN NONE SEEN   Squamous Epithelial / LPF 0-5 0 - 5   Mucus PRESENT     Comment: Performed at [facility], [place] [address], [place], [place] [address]  Basic metabolic panel     Status: Abnormal   Collection Time: 08/16/18  8:38 PM  Result Value Ref Range  Sodium 135 135 - 145 mmol/L   Potassium 3.8 3.5 - 5.1 mmol/L   Chloride 109 98 - 111 mmol/L   CO2 20 (L) 22 - 32 mmol/L   Glucose, Bld 77 70 - 99 mg/dL   BUN 24 (H) 8 - 23 mg/dL   Creatinine, Ser 1.23 (H) 0.44 - 1.00 mg/dL   Calcium 7.6 (L) 8.9 - 10.3 mg/dL   GFR calc non Af Amer 38 (L) >60 mL/min   GFR calc Af Amer 43 (L) >60 mL/min   Anion gap 6 5 - 15    Comment: Performed at [facility] [address], [place], [place] [address]  Magnesium     Status: None   Collection Time: 08/16/18  8:38 PM  Result Value Ref Range   Magnesium 1.9 1.7 - 2.4 mg/dL    Comment: Performed at [facility], [place] [address], [place], [place] [address]  Magnesium     Status: None   Collection Time: 08/17/18  3:24 AM  Result Value Ref Range   Magnesium 1.9 1.7 - 2.4 mg/dL    Comment: Performed at [facility] [address], [place], [place] [address]  CBC with Differential/Platelet     Status: Abnormal   Collection Time: 08/17/18  3:24 AM  Result Value Ref Range   WBC 13.7 (H) 4.0 - 10.5 K/uL   RBC 3.64 (L) 3.87 - 5.11 MIL/uL   Hemoglobin 10.3 (L) 12.0 - 15.0 g/dL   HCT 33.2 (L) 36.0 - 46.0 %   MCV 91.2 80.0 - 100.0 fL   MCH 28.3 26.0 - 34.0 pg   MCHC 31.0 30.0 - 36.0 g/dL   RDW 13.4 11.5 - 15.5 %   Platelets 217 150 - 400 K/uL   nRBC 0.0 0.0 - 0.2 %   Neutrophils Relative % 83 %   Neutro Abs 11.3 (H) 1.7 - 7.7 K/uL   Lymphocytes Relative 9 %   Lymphs Abs 1.3 0.7  - 4.0 K/uL   Monocytes Relative 7 %   Monocytes Absolute 0.9 0.1 - 1.0 K/uL   Eosinophils Relative 0 %   Eosinophils Absolute 0.0 0.0 - 0.5 K/uL   Basophils Relative 0 %   Basophils Absolute 0.0 0.0 - 0.1 K/uL   Immature Granulocytes 1 %   Abs Immature Granulocytes 0.15 (H) 0.00 - 0.07 K/uL    Comment: Performed at [facility] [address], [place], [place] [address]  Comprehensive metabolic panel     Status: Abnormal   Collection Time: 08/17/18  3:24 AM  Result Value Ref Range   Sodium 137 135 - 145 mmol/L   Potassium 4.1 3.5 - 5.1 mmol/L   Chloride 114 (H) 98 - 111 mmol/L   CO2 15 (L) 22 - 32 mmol/L   Glucose, Bld 75 70 - 99 mg/dL   BUN 26 (H) 8 - 23 mg/dL   Creatinine, Ser 1.20 (H) 0.44 - 1.00 mg/dL   Calcium 7.6 (L) 8.9 - 10.3 mg/dL   Total Protein 5.2 (L) 6.5 - 8.1 g/dL   Albumin 2.2 (L) 3.5 - 5.0 g/dL   AST 68 (H) 15 - 41 U/L   ALT 84 (H) 0 - 44 U/L   Alkaline Phosphatase 114 38 - 126 U/L   Total Bilirubin 1.6 (H) 0.3 - 1.2 mg/dL   GFR calc non Af Amer 39 (L) >60 mL/min   GFR calc Af Amer 45 (L) >60 mL/min   Anion gap  8 5 - 15    Comment: Performed at [facility], [place] [address], [place], [place] [address]    Studies/Results: Ir Perc Cholecystostomy  Result Date: 08/16/2018 INDICATION: Concern for occlusive choledocholithiasis, now with hemodynamic instability and poor candidate for endoscopy. After stabilization in the ICU, request made for placement of a percutaneous biliary drainage catheter for infection source control purposes. EXAM: ULTRASOUND AND FLUOROSCOPIC-GUIDED CHOLECYSTOSTOMY TUBE PLACEMENT COMPARISON:  MRCP - 08/15/2018; 07/06/2018; CT abdomen and pelvis - 08/14/2018 MEDICATIONS: The patient is currently admitted to the hospital and on intravenous antibiotics. Antibiotics were administered within an appropriate time frame prior to skin puncture. ANESTHESIA/SEDATION: Moderate (conscious) sedation was employed during this procedure. A total of  Versed 1.5 mg and Fentanyl 50 mcg was administered intravenously. Moderate Sedation Time: 63 minutes. The patient's level of consciousness and vital signs were monitored continuously by radiology nursing throughout the procedure under my direct supervision. CONTRAST:  40mL OMNIPAQUE IOHEXOL 300 MG/ML SOLN - administered into the biliary tree, hepatic parenchyma and gallbladder fossa. FLUOROSCOPY TIME:  13 minutes, 48 seconds (55 mGy) COMPLICATIONS: None immediate. PROCEDURE: Informed written consent was obtained from the patient and the patient's family after a discussion of the risks, benefits and alternatives to treatment. Questions regarding the procedure were encouraged and answered. A timeout was performed prior to the initiation of the procedure. The right upper abdominal quadrant was prepped and draped in the usual sterile fashion, and a sterile drape was applied covering the operative field. Maximum barrier sterile technique with sterile gowns and gloves were used for the procedure. A timeout was performed prior to the initiation of the procedure. Local anesthesia was provided with 1% lidocaine with epinephrine. Ultrasound evaluation of the abdomen was negative for any significant dilatation of the peripheral aspect of the biliary tree. Note is made of marked gallbladder wall thickening as well as the presence of known cholelithiasis and gallbladder sludge. Multiple attempts were made to cannulate the biliary tree both from right and left-sided approaches. At one point, there is minimal opacification of the central aspect of the right biliary tree with eventual opacification of the common bile and cystic ducts. Given patient's hemodynamic instability as well as lack of any significant intrahepatic biliary ductal dilatation and patency of the cystic duct the decision was made to proceed with cholecystostomy tube placement. As such, utilizing a transhepatic approach, a 22 gauge needle was advanced into the  gallbladder under direct ultrasound guidance. An ultrasound image was saved for documentation purposes. Appropriate intraluminal puncture was confirmed with the efflux of bile and advancement of an 0.018 wire into the gallbladder lumen. The needle was exchanged for an [place] set. A small amount of contrast was injected to confirm appropriate intraluminal positioning. Over a short Amplatz wire, a 10.2-French Cook cholecystomy tube was advanced into the gallbladder fossa, coiled and locked. Bile was aspirated and a small amount of contrast was injected as several post procedural spot radiographic images were obtained in various obliquities. The catheter was secured to the skin with suture, connected to a drainage bag and a dressing was placed. The patient tolerated the procedure well without immediate post procedural complication. FINDINGS: Sonographic evaluation of the abdomen is negative for any significant intrahepatic biliary duct dilatation. Note is made of gallbladder wall thickening pericholecystic fluid as well as multiple radiopaque gallstones or biliary sludge. Limited percutaneous cholangiogram demonstrates opacification of a moderately dilated common bile duct as well as opacification of the cystic duct. There is no definitive passage of  contrast to the distal aspect of the CBD. Given lack of any significant intrahepatic biliary ductal dilatation, as well as patency of the cystic duct and abnormal appearance of the gallbladder, the decision was made to proceed with cholecystostomy tube placement. After image guided placement, the 10 French percutaneous cholecystostomy tube is appropriately positioned with end coiled and locked within the lumen of the gallbladder. IMPRESSION: 1. Attempted though ultimately unsuccessful placement of an internal/external biliary drainage catheter secondary to lack of any significant dilatation of the intrahepatic biliary system. 2. Given abnormal sonographic appearance of  the gallbladder as well as limited percutaneous cholangiogram demonstrating patency of the cystic duct, the decision was made to proceed with image guided cholecystostomy tube placement for infection source control purposes. 3. Successful ultrasound and fluoroscopic guided placement of a 10.2 French cholecystostomy tube. PLAN: - Continued resuscitative measures by the ICU staff. - Maintain chole tube to gravity bag. - Obtain daily CMP to evaluate bilirubin trend. - Diagnostic percutaneous cholangiogram could be performed via the existing cholecystostomy tube following the resolution of acute symptoms. Above findings discussed with Dr. Nelda Marseille at the time of procedure completion. Electronically Signed   By: Sandi Mariscal M.D.   On: 08/16/2018 19:51    Medications: I have reviewed the patient's current medications.  Assessment: Cholangitis( abdominal pain, elevated liver enzymes),with suspected 7 mm distal CBD stone just above the ampulla, poorly visualized, intra-and extrahepatic dilatation. Placement of a percutaneous cholecystostomy tube by IR yesterday Improvement in leukocytosis, LFTs trending down  Atrial fibrillation with RVR Hypotension Renal impairment Acidosis Normocytic anemia  Plan: Continue IV Zosyn and IV fluids for resuscitation As patient has a percutaneous cholecystostomy drainage, ERCP is not an emergent Patient continues to have heart rate of above 492 with systolic blood pressure in 90s-on Coreg,has received amiodarone, continued on IV fluids. Recommend cholangiogram by percutaneous cholecystostomy tube by IR in the next 24-48 hours, if CBD obstruction/stone noted, plan ERCP accordingly, depending upon patient's hemodynamic stability. Discussed the same with her grandson at bedside. Start clear liquid diet for now    [name] 08/17/2018, 7:45 AM   Pager [phone] If no answer or after 5 PM call [phone]

## 2018-08-17 NOTE — Progress Notes (Signed)
NAME:  Kylie May, MRN:  [id number], DOB:  Nov 21, 1921, LOS: 2 ADMISSION DATE:  08/15/2018, CONSULTATION DATE:  08/16/2018 REFERRING MD:  TR[facility] Starla Link, CHIEF COMPLAINT:  Septic shock   Brief History   85 year old female with PMH below who present with a common bile duct stone resulting in septic shock.  GI was consulted but does not feel comfortable taking her to endoscopy due to cardiopulmonary instability.  Surgery was called and patient is too unstable for surgery.  PCCM was consulted to assist with vital stabilization prior to consideration of IR drainage.  Patient is hard of hearing and it is difficult to get history of her.  Patient was transferred from [facility] to [facility] for GI to address the stone.  Patient is currently hypotensive (80's after 2 liter of fluid) and with a-fib RVR (130-140).  History of present illness   85 year old female with PMH below who present with a common bile duct stone resulting in septic shock.  GI was consulted but does not feel comfortable taking her to endoscopy due to cardiopulmonary instability.  Surgery was called and patient is too unstable for surgery.  PCCM was consulted to assist with vital stabilization prior to consideration of IR drainage.  Patient is hard of hearing and it is difficult to get history of her.  Patient was transferred from [facility] to [facility] for GI to address the stone.  Patient is currently hypotensive (80's after 2 liter of fluid) and with a-fib RVR (130-140).  Past Medical History  A-fib with RVR  Significant Hospital Events   3/15 transfer to the ICU for evolving septic shock  Consults:  GI CCS PCCM Cardiology  Procedures:  None  Significant Diagnostic Tests:  MRI of the abdomen with obstructing common bile duct stone  Micro Data:  Blood 3/15>>>  Antimicrobials:  Zosyn 3/15>>>   Interim history/subjective:  No issues overnight. Occasional rapid heart rate. Patient states she feels great this morning.    Objective    Blood pressure 100/65, pulse (!) 111, temperature 97.8 F (36.6 C), temperature source Oral, resp. rate 15, height 4' 9.01" (1.448 m), weight 52.8 kg, SpO2 97 %.        Intake/Output Summary (Last 24 hours) at 08/17/2018 0930 Last data filed at 08/17/2018 0400 Gross per 24 hour  Intake 3636.97 ml  Output 375 ml  Net 3261.97 ml   Filed Weights   08/15/18 2102 08/16/18 0411 08/17/18 0424  Weight: 48 kg 50.8 kg 52.8 kg    Examination: General: elderly FM, resting in bed, no apparent distress HENT: NCAT, sclera clear  Lungs: CTAB, no crackles, no wheeze  Cardiovascular: RRR, no mrg  Abdomen: soft, nt, mildly distended Extremities: no edema, radial and DP pulses  Neuro: AAOX3, hear of hearing   Resolved Hospital Problem list   N/A  Assessment & Plan:   Acute Cholecystitis, Septic shock, obstructing CBD  - shock has resolved  - continue zosyn   - IR/GI planned cholangiogram within the next 24-48 hours   - GI planning possible ERCP   - Surgery considering delayed lap-chole    Atrial Fibrillation with RVR  - better rate control  - off pressors at this time   - will stop coreg and add q8H metoprolol   - may need to restart home flecanide   - hold eliquis due to planned procedures   Post-op pain  - tylenol  - avoid nephrotoxic agents   Nutrition:  - clear liquids  Mobility   - consults to PT/OT   Stable for transfer to [facility] Discussed with Dr. Thereasa Solo.   Labs   CBC: Recent Labs  Lab 08/14/18 2055 08/15/18 1413 08/15/18 2040 08/16/18 0938 08/17/18 0324  WBC 17.9*  18.1* 16.8* 15.0* 14.8* 13.7*  NEUTROABS 15.6*  --  12.9* 13.0* 11.3*  HGB 11.0*  11.0* 9.8* 9.6* 10.0* 10.3*  HCT 33.5*  33.6* 29.2* 29.9* 31.2* 33.2*  MCV 88.9  88.9 86.6 87.9 86.7 91.2  PLT 292  284 216 203 204 938   Basic Metabolic Panel: Recent Labs  Lab 08/14/18 2055 08/15/18 2040 08/16/18 0428 08/16/18 2038 08/17/18 0324  NA 134* 133* 135 135 137  K 3.6 3.8 3.8 3.8 4.1  CL  103 103 106 109 114*  CO2 22 21* 19* 20* 15*  GLUCOSE 170* 99 88 77 75  BUN 13 17 16  24* 26*  CREATININE 0.84 1.36* 1.17* 1.23* 1.20*  CALCIUM 8.7* 7.9* 8.0* 7.6* 7.6*  MG  --  1.1* 2.0 1.9 1.9   GFR: Estimated Creatinine Clearance: 20 mL/min (A) (by C-G formula based on SCr of 1.2 mg/dL (H)). Recent Labs  Lab 08/15/18 1413 08/15/18 2040 08/15/18 2358 08/16/18 0429 08/16/18 0938 08/17/18 0324  WBC 16.8* 15.0*  --   --  14.8* 13.7*  LATICACIDVEN  --   --  1.0 0.9  --   --    Liver Function Tests: Recent Labs  Lab 08/14/18 2055 08/15/18 1413 08/15/18 2040 08/16/18 0938 08/17/18 0324  AST 407* 242* 180* 106* 68*  ALT 150* 182* 153* 117* 84*  ALKPHOS 153* 138* 136* 128* 114  BILITOT 1.5* 3.8* 3.6* 2.6* 1.6*  PROT 6.7 6.0* 5.6* 5.2* 5.2*  ALBUMIN 3.6 2.9* 2.7* 2.5* 2.2*   Recent Labs  Lab 08/14/18 2055  LIPASE 107*   No results for input(s): AMMONIA in the last 168 hours.  ABG No results found for: PHART, PCO2ART, PO2ART, HCO3, TCO2, ACIDBASEDEF, O2SAT   Coagulation Profile: Recent Labs  Lab 08/15/18 2040  INR 1.3*   Cardiac Enzymes: Recent Labs  Lab 08/14/18 2055  TROPONINI <0.03   HbA1C: No results found for: HGBA1C  CBG: Recent Labs  Lab 08/15/18 2203 08/16/18 0413 08/16/18 1610  GLUCAP 99 82 76     Garner Nash, DO [facility] 08/17/2018 9:30 AM  Personal pager: [phone] If unanswered, please page CCM On-call: [phone]

## 2018-08-17 NOTE — Progress Notes (Signed)
[facility] Progress Note Patient Name: Kylie May DOB: [birth year] MRN: [id number]   Date of Service  08/17/2018  HPI/Events of Note  HR still 120s with SBP 90s Chronic afib  eICU Interventions  Ordered amiodarone 150     Intervention Category Major Interventions: Arrhythmia - evaluation and management  Judd Lien 08/17/2018, 3:10 AM

## 2018-08-17 NOTE — Progress Notes (Signed)
Patient last voided at 0400 this morning. Patient has not voided this shift. Patient states she does not feel the need to void but could go if I wanted her to. Patient is laying in bed, ready to take a nap. Told patient we would get her up to the bathroom after her nap. Will continue to monitor. - Soyla Dryer, RN

## 2018-08-17 NOTE — Progress Notes (Signed)
   08/17/18 1100  Clinical Encounter Type  Visited With Patient and family together  Visit Type Initial;Spiritual support  Referral From Other (Comment)  Consult/Referral To Other (Comment)  Spiritual Encounters  Spiritual Needs Prayer;Emotional  Stress Factors  Patient Stress Factors None identified  Family Stress Factors None identified   While rounding floor. PT was sitting up on her chair and family were at bedside. PT waived me in. PT was thankful for the Chaplain visit and asked for prayer for her and a family member. I offered spiritual care with words of comfort, ministry of presence, and prayer. Chaplain available as needed. Chaplain Fidel Levy [phone]

## 2018-08-17 NOTE — Progress Notes (Signed)
[place] Progress Note Patient Name: Kylie May DOB: 1922-04-13 MRN: [id number]   Date of Service  08/17/2018  HPI/Events of Note  IV fluids with 0.9 NaCl written for 125 mL/hour and 100 mL/hour. Hx of valvular heart disease with severe MR.   eICU Interventions  Will order:  1. D/C 0.9 NaCl at 125 mL/hour.  2. Decrease 0.9 NaCl to run at 75 mL/hour.      Intervention Category Major Interventions: Other:  Lysle Dingwall 08/17/2018, 8:05 PM

## 2018-08-18 ENCOUNTER — Encounter (HOSPITAL_COMMUNITY): Payer: Self-pay

## 2018-08-18 ENCOUNTER — Inpatient Hospital Stay (HOSPITAL_COMMUNITY): Payer: Medicare Other

## 2018-08-18 DIAGNOSIS — I340 Nonrheumatic mitral (valve) insufficiency: Secondary | ICD-10-CM

## 2018-08-18 HISTORY — PX: IR CHOLANGIOGRAM EXISTING TUBE: IMG6040

## 2018-08-18 LAB — URINE CULTURE: Culture: <10000 — AB

## 2018-08-18 LAB — MAGNESIUM: Magnesium: 2 mg/dL (ref 1.7–2.4)

## 2018-08-18 MED ORDER — ACETAMINOPHEN 325 MG PO TABS
650.0000 mg | ORAL_TABLET | Freq: Four times a day (QID) | ORAL | Status: DC | PRN
  Administered 2018-08-18 – 2018-08-22 (×3): 650 mg via ORAL
  Filled 2018-08-18 (×3): qty 2

## 2018-08-18 MED ORDER — ONDANSETRON HCL 4 MG/2ML IJ SOLN
4.0000 mg | Freq: Four times a day (QID) | INTRAMUSCULAR | Status: DC | PRN
  Administered 2018-08-18 – 2018-08-20 (×5): 4 mg via INTRAVENOUS
  Filled 2018-08-18 (×5): qty 2

## 2018-08-18 MED ORDER — IOHEXOL 300 MG/ML  SOLN
50.0000 mL | Freq: Once | INTRAMUSCULAR | Status: AC | PRN
  Administered 2018-08-18: 40 mL via INTRAVENOUS

## 2018-08-18 NOTE — Progress Notes (Addendum)
During handoff report with day shift nurse Colletta Maryland B. Patient's code status was questioned by the family (partial code). I advised that they discuss this with patient and let me know what their wishes were. At 9:40 pm I was informed that a decision had been made to become Full Code. This was confirmed by the patient. I will page [facility] on-call [facility] to inform them of this decision.

## 2018-08-18 NOTE — Progress Notes (Addendum)
[place]  JGG:[id number] DOB: [birth year] DOA: 08/15/2018 PCP: [name], MD    Brief Narrative:  [phone] w/ a hx of Afib who presented with a common bile duct stone resulting in septic shock. GI did not feel comfortable taking her to endoscopy due to cardiopulmonary instability. General Surgery also felt she was too unstable for surgery.  PCCM was consulted to assist with vital stabilization prior to consideration of IR drainage. She was transferred from [facility] to [facility] for GI to address the stone.   Significant Events: 3/15 perc cholecystotomy in IR   Subjective: Resting comfortably in bed. Denies any new complaints. Denies cp, sob, or n/v.   Assessment & Plan:  Septic shock due to Acute Cholecystitis w/ obstructing CBD stone  shock has resolved - continue zosyn - percutaneous IR drain placed 3/15 - to have cholangiogram today to determine if ERCP indicated - WBC falling and pt is afebrile               Chronic Atrial Fibrillation with Acute RVR holding eliquis due to planned procedures - rate not ideally controlled - adjust tx and follow - amio stopped in outpt setting previously due to elevated TSH - Cards has suggested resuming amiodarone but family thus far has refused and desires flecainide instead   Chronic diastolic CHF - Severe MR - Severe TR No evidence of volume overload   Acute kidney injury  Due to sepsis - hydrate - follow trend   DVT prophylaxis: SCDs Code Status: LIMITED CODE Family Communication: no family present at time of exam  Disposition Plan:   Consultants:  GI CCS PCCM Cardiology  Antimicrobials:   Zosyn 3/15 >  Objective: Blood pressure 106/75, pulse (!) 118, temperature (!) 97.4 F (36.3 C), temperature source Oral, resp. rate (!) 21, height 4' 9.01" (1.448 m), weight 54.5 kg, SpO2 99 %.  Intake/Output Summary (Last 24 hours) at 08/18/2018 1137 Last data filed at 08/18/2018 0900 Gross per 24 hour   Intake 1412.61 ml  Output 620 ml  Net 792.61 ml   Filed Weights   08/16/18 0411 08/17/18 0424 08/18/18 0437  Weight: 50.8 kg 52.8 kg 54.5 kg    Examination: General: No acute respiratory distress - HOH Lungs: Clear to auscultation bilaterally without wheezes or crackles Cardiovascular: tachycardic - irreg irreg - no rub  Abdomen: Soft, bs+, no mass, no rebound  Extremities: No signif edema B LE   CBC: Recent Labs  Lab 08/15/18 2040 08/16/18 0938 08/17/18 0324  WBC 15.0* 14.8* 13.7*  NEUTROABS 12.9* 13.0* 11.3*  HGB 9.6* 10.0* 10.3*  HCT 29.9* 31.2* 33.2*  MCV 87.9 86.7 91.2  PLT 203 204 035   Basic Metabolic Panel: Recent Labs  Lab 08/16/18 0428 08/16/18 2038 08/17/18 0324 08/18/18 0342  NA 135 135 137  --   K 3.8 3.8 4.1  --   CL 106 109 114*  --   CO2 19* 20* 15*  --   GLUCOSE 88 77 75  --   BUN 16 24* 26*  --   CREATININE 1.17* 1.23* 1.20*  --   CALCIUM 8.0* 7.6* 7.6*  --   MG 2.0 1.9 1.9 2.0   GFR: Estimated Creatinine Clearance: 20.4 mL/min (A) (by C-G formula based on SCr of 1.2 mg/dL (H)).  Liver Function Tests: Recent Labs  Lab 08/15/18 1413 08/15/18 2040 08/16/18 0938 08/17/18 0324  AST 242* 180* 106* 68*  ALT 182* 153* 117*  84*  ALKPHOS 138* 136* 128* 114  BILITOT 3.8* 3.6* 2.6* 1.6*  PROT 6.0* 5.6* 5.2* 5.2*  ALBUMIN 2.9* 2.7* 2.5* 2.2*   Recent Labs  Lab 08/14/18 2055  LIPASE 107*    Coagulation Profile: Recent Labs  Lab 08/15/18 2040  INR 1.3*    Cardiac Enzymes: Recent Labs  Lab 08/14/18 2055  TROPONINI <0.03    Recent Results (from the past 240 hour(s))  Culture, blood (routine x 2)     Status: None (Preliminary result)   Collection Time: 08/15/18  3:25 PM  Result Value Ref Range Status   Specimen Description BLOOD RIGHT [facility]  Final   Special Requests BOTTLES DRAWN AEROBIC AND ANAEROBIC  Final   Culture   Final    NO GROWTH 3 DAYS Performed at [facility], [address], [place], [place] [address]     Report Status PENDING  Incomplete  Culture, blood (routine x 2)     Status: None (Preliminary result)   Collection Time: 08/15/18  3:25 PM  Result Value Ref Range Status   Specimen Description BLOOD RFOA  Final   Special Requests BOTTLES DRAWN AEROBIC AND ANAEROBIC  Final   Culture   Final    NO GROWTH 3 DAYS Performed at [facility], [address] [place], [place] [address]    Report Status PENDING  Incomplete  Culture, blood (Routine X 2) w Reflex to ID Panel     Status: None (Preliminary result)   Collection Time: 08/16/18  3:38 PM  Result Value Ref Range Status   Specimen Description BLOOD RIGHT HAND  Final   Special Requests AEROBIC BOTTLE ONLY Blood Culture adequate volume  Final   Culture   Final    NO GROWTH 2 DAYS Performed at [facility], [place] [address] [place], [place] [address]    Report Status PENDING  Incomplete  Culture, blood (Routine X 2) w Reflex to ID Panel     Status: None (Preliminary result)   Collection Time: 08/16/18  3:44 PM  Result Value Ref Range Status   Specimen Description BLOOD LEFT HAND  Final   Special Requests AEROBIC BOTTLE ONLY Blood Culture adequate volume  Final   Culture   Final    NO GROWTH 2 DAYS Performed at [facility], [place] [address], [place], [place] [address]    Report Status PENDING  Incomplete  MRSA PCR Screening     Status: None   Collection Time: 08/16/18  4:53 PM  Result Value Ref Range Status   MRSA by PCR NEGATIVE NEGATIVE Final    Comment:        The GeneXpert MRSA Assay (FDA approved for NASAL specimens only), is one component of a comprehensive MRSA colonization surveillance program. It is not intended to diagnose MRSA infection nor to guide or monitor treatment for MRSA infections. Performed at [facility], [place] [address], [place], [place] [address]   Urine Culture     Status: Abnormal   Collection Time: 08/16/18  8:37 PM  Result Value Ref Range Status   Specimen Description  URINE, CLEAN CATCH  Final   Special Requests NONE  Final   Culture (A)  Final    <10,000 COLONIES/mL INSIGNIFICANT GROWTH Performed at [facility], [place] [address], [place], [place] [address]    Report Status 08/18/2018 FINAL  Final     Scheduled Meds: . flecainide  50 mg Oral Q12H  . mouth rinse  15 mL Mouth Rinse BID  . metoprolol tartrate  12.5 mg Oral Q8H  . sodium chloride flush  5 mL Intracatheter Q8H     LOS: 3 days   Cherene Altes, MD [facility] Office  [phone] Pager - Text Page per Shea Evans  If 7PM-7AM, please contact night-coverage per Amion 08/18/2018, 11:37 AM

## 2018-08-18 NOTE — Progress Notes (Signed)
Progress Note  Patient Name: Kylie May Date of Encounter: 08/18/2018  Primary Cardiologist: Ida Rogue, MD   Subjective   Feels well. Remains in atrial fibrillation with RVR (110s asleep, 140s when talking). BP remains low (SBP 80s-90s).  Inpatient Medications    Scheduled Meds: . flecainide  50 mg Oral Q12H  . mouth rinse  15 mL Mouth Rinse BID  . metoprolol tartrate  12.5 mg Oral Q8H  . sodium chloride flush  5 mL Intracatheter Q8H   Continuous Infusions: . sodium chloride 75 mL/hr at 08/18/18 0413  . sodium chloride    . piperacillin-tazobactam (ZOSYN)  IV Stopped (08/18/18 0819)   PRN Meds: acetaminophen, diphenhydrAMINE, white petrolatum   Vital Signs    Vitals:   08/18/18 0852 08/18/18 0910 08/18/18 1000 08/18/18 1100  BP: 91/77 95/83 97/79  106/75  Pulse: (!) 120 (!) 129 (!) 123 (!) 118  Resp: (!) 21 (!) 25 (!) 24 (!) 21  Temp:      TempSrc:      SpO2: 98% 98% 97% 99%  Weight:      Height:        Intake/Output Summary (Last 24 hours) at 08/18/2018 1135 Last data filed at 08/18/2018 0900 Gross per 24 hour  Intake 1412.61 ml  Output 620 ml  Net 792.61 ml   Last 3 Weights 08/18/2018 08/17/2018 08/16/2018  Weight (lbs) 120 lb 2.4 oz 116 lb 6.5 oz 111 lb 15.9 oz  Weight (kg) 54.5 kg 52.8 kg 50.8 kg      Telemetry    AFib w RVR - Personally Reviewed  ECG    No new tracing - Personally Reviewed  Physical Exam  Appears elderly and frail, but in no distress and smiling GEN: No acute distress.   Neck: No JVD Cardiac: irregular, no murmurs, rubs, or gallops.  Respiratory: Clear to auscultation bilaterally. GI: Soft, nontender, non-distended.  Cholecystostomy.  MS: No edema; No deformity. Neuro:  Nonfocal except severely hard of hearing Psych: Normal affect   Labs    Chemistry Recent Labs  Lab 08/15/18 2040 08/16/18 0428 08/16/18 0938 08/16/18 2038 08/17/18 0324  NA 133* 135  --  135 137  K 3.8 3.8  --  3.8 4.1  CL 103 106  --   109 114*  CO2 21* 19*  --  20* 15*  GLUCOSE 99 88  --  77 75  BUN 17 16  --  24* 26*  CREATININE 1.36* 1.17*  --  1.23* 1.20*  CALCIUM 7.9* 8.0*  --  7.6* 7.6*  PROT 5.6*  --  5.2*  --  5.2*  ALBUMIN 2.7*  --  2.5*  --  2.2*  AST 180*  --  106*  --  68*  ALT 153*  --  117*  --  84*  ALKPHOS 136*  --  128*  --  114  BILITOT 3.6*  --  2.6*  --  1.6*  GFRNONAA 33* 40*  --  38* 39*  GFRAA 39* 46*  --  43* 45*  ANIONGAP 9 10  --  6 8     Hematology Recent Labs  Lab 08/15/18 2040 08/16/18 0938 08/17/18 0324  WBC 15.0* 14.8* 13.7*  RBC 3.40* 3.60* 3.64*  HGB 9.6* 10.0* 10.3*  HCT 29.9* 31.2* 33.2*  MCV 87.9 86.7 91.2  MCH 28.2 27.8 28.3  MCHC 32.1 32.1 31.0  RDW 13.1 13.2 13.4  PLT 203 204 217    Cardiac Enzymes Recent Labs  Lab 08/14/18 2055  TROPONINI <0.03   No results for input(s): TROPIPOC in the last 168 hours.   BNP Recent Labs  Lab 08/15/18 2040  BNP 1,005.7*     DDimer No results for input(s): DDIMER in the last 168 hours.   Radiology    Ir Perc Cholecystostomy  Result Date: 08/16/2018 INDICATION: Concern for occlusive choledocholithiasis, now with hemodynamic instability and poor candidate for endoscopy. After stabilization in the ICU, request made for placement of a percutaneous biliary drainage catheter for infection source control purposes. EXAM: ULTRASOUND AND FLUOROSCOPIC-GUIDED CHOLECYSTOSTOMY TUBE PLACEMENT COMPARISON:  MRCP - 08/15/2018; 07/06/2018; CT abdomen and pelvis - 08/14/2018 MEDICATIONS: The patient is currently admitted to the hospital and on intravenous antibiotics. Antibiotics were administered within an appropriate time frame prior to skin puncture. ANESTHESIA/SEDATION: Moderate (conscious) sedation was employed during this procedure. A total of Versed 1.5 mg and Fentanyl 50 mcg was administered intravenously. Moderate Sedation Time: 63 minutes. The patient's level of consciousness and vital signs were monitored continuously by radiology  nursing throughout the procedure under my direct supervision. CONTRAST:  82mL OMNIPAQUE IOHEXOL 300 MG/ML SOLN - administered into the biliary tree, hepatic parenchyma and gallbladder fossa. FLUOROSCOPY TIME:  13 minutes, 48 seconds (55 mGy) COMPLICATIONS: None immediate. PROCEDURE: Informed written consent was obtained from the patient and the patient's family after a discussion of the risks, benefits and alternatives to treatment. Questions regarding the procedure were encouraged and answered. A timeout was performed prior to the initiation of the procedure. The right upper abdominal quadrant was prepped and draped in the usual sterile fashion, and a sterile drape was applied covering the operative field. Maximum barrier sterile technique with sterile gowns and gloves were used for the procedure. A timeout was performed prior to the initiation of the procedure. Local anesthesia was provided with 1% lidocaine with epinephrine. Ultrasound evaluation of the abdomen was negative for any significant dilatation of the peripheral aspect of the biliary tree. Note is made of marked gallbladder wall thickening as well as the presence of known cholelithiasis and gallbladder sludge. Multiple attempts were made to cannulate the biliary tree both from right and left-sided approaches. At one point, there is minimal opacification of the central aspect of the right biliary tree with eventual opacification of the common bile and cystic ducts. Given patient's hemodynamic instability as well as lack of any significant intrahepatic biliary ductal dilatation and patency of the cystic duct the decision was made to proceed with cholecystostomy tube placement. As such, utilizing a transhepatic approach, a 22 gauge needle was advanced into the gallbladder under direct ultrasound guidance. An ultrasound image was saved for documentation purposes. Appropriate intraluminal puncture was confirmed with the efflux of bile and advancement of an  0.018 wire into the gallbladder lumen. The needle was exchanged for an [place] set. A small amount of contrast was injected to confirm appropriate intraluminal positioning. Over a short Amplatz wire, a 10.2-French Cook cholecystomy tube was advanced into the gallbladder fossa, coiled and locked. Bile was aspirated and a small amount of contrast was injected as several post procedural spot radiographic images were obtained in various obliquities. The catheter was secured to the skin with suture, connected to a drainage bag and a dressing was placed. The patient tolerated the procedure well without immediate post procedural complication. FINDINGS: Sonographic evaluation of the abdomen is negative for any significant intrahepatic biliary duct dilatation. Note is made of gallbladder wall thickening pericholecystic fluid as well as multiple radiopaque gallstones or biliary sludge.  Limited percutaneous cholangiogram demonstrates opacification of a moderately dilated common bile duct as well as opacification of the cystic duct. There is no definitive passage of contrast to the distal aspect of the CBD. Given lack of any significant intrahepatic biliary ductal dilatation, as well as patency of the cystic duct and abnormal appearance of the gallbladder, the decision was made to proceed with cholecystostomy tube placement. After image guided placement, the 10 French percutaneous cholecystostomy tube is appropriately positioned with end coiled and locked within the lumen of the gallbladder. IMPRESSION: 1. Attempted though ultimately unsuccessful placement of an internal/external biliary drainage catheter secondary to lack of any significant dilatation of the intrahepatic biliary system. 2. Given abnormal sonographic appearance of the gallbladder as well as limited percutaneous cholangiogram demonstrating patency of the cystic duct, the decision was made to proceed with image guided cholecystostomy tube placement for  infection source control purposes. 3. Successful ultrasound and fluoroscopic guided placement of a 10.2 French cholecystostomy tube. PLAN: - Continued resuscitative measures by the ICU staff. - Maintain chole tube to gravity bag. - Obtain daily CMP to evaluate bilirubin trend. - Diagnostic percutaneous cholangiogram could be performed via the existing cholecystostomy tube following the resolution of acute symptoms. Above findings discussed with Dr. Nelda Marseille at the time of procedure completion. Electronically Signed   By: Sandi Mariscal M.D.   On: 08/16/2018 19:51    Cardiac Studies   08/17/2018 ECHO  1. The left ventricle has normal systolic function, with an ejection fraction of 60-65%. The cavity size was normal. Left ventricular diastolic function could not be evaluated secondary to atrial fibrillation. No evidence of left ventricular regional  wall motion abnormalities.  2. The right ventricle has normal systolc function. The cavity was normal. There is no increase in right ventricular wall thickness. Right ventricular systolic pressure is mildly elevated with an estimated pressure of 43.5 mmHg.  3. Left atrial size was moderately dilated.  4. Right atrial size was moderately dilated.  5. Small pericardial effusion.  6. The pericardial effusion is circumferential.  7. The aortic valve is tricuspid Mild thickening of the aortic valve Mild calcification of the aortic valve.  8. The inferior vena cava was dilated in size with <50% respiratory variability.  Patient Profile     85 y.o. female with a hx of paroxysmal asymptomaticatrial fibrillation on Eliquis,mild diastolic CHF, HTN, hiatal hernia, osteopenia, degenerative joint disease, scoliosis, hearing lossadmitted with acute cholecystitis/ choledocholithiasis, complicated by persistent atrial fibrillation with RVR, difficult to rate control due to low BP.   Assessment & Plan    1. AFib: so far remains in persistent AFib after two doses of  flecainide. Would give it another 24 hours since she is not in CHF or any distress. I have already explained my preference for amiodarone to the family, since it will at least provide rate control even if ineffective in producing return to NSR. This will be our next step if flecainide fails. 2. Valvular heart disease:  Previous report of severe MR/TR are not confirmed by current echo (and her physical exam does not suggest these either). Mild MR and mild TR on yesterday's echo. Mild PAH. Note dilated IVC suggests that she is volume replete and her low BP is not secondary to hypovolemia. 3. Biliary obstruction/infection: improved clinically, but WBC still high (albeit slow improving trend).      For questions or updates, please contact [place] Please consult [email or web address] for contact info under        Signed,  Sanda Klein, MD  08/18/2018, 11:35 AM

## 2018-08-18 NOTE — Progress Notes (Signed)
Subjective: CC: Abdomian Patient reports less pain today - "more like cramping than pain". No N/V. Tolerated CLD this AM. Passing flatus. BM overnight. Family at bedside.   Objective: Vital signs in last 24 hours: Temp:  [97.4 F (36.3 C)-98.5 F (36.9 C)] 97.4 F (36.3 C) (03/17 0835) Pulse Rate:  [106-130] 107 (03/17 0600) Resp:  [16-27] 18 (03/17 0600) BP: (70-107)/(52-75) 107/65 (03/17 0600) SpO2:  [92 %-99 %] 99 % (03/17 0600) Weight:  [54.5 kg] 54.5 kg (03/17 0437) Last BM Date: (PTA)  Intake/Output from previous day: 03/16 0701 - 03/17 0700 In: 2659.3 [I.V.:2461; IV Piggyback:183.3] Out: 620 [Urine:250; Drains:370] Intake/Output this shift: No intake/output data recorded.  PE: Gen:  Alert, NAD, pleasant Card:  Irregularly, Irregular (HR 110 - 140's on monitor while in room) Pulm:  Normal effort, clear to auscultation bilaterally Abd: Soft, very minimal TTP in RUQ, non-distended, +BS, no HSM, drain in RUQ with bilious output. 370 cc overnight.  Skin: warm and dry, no rashes   Lab Results:  Recent Labs    08/16/18 0938 08/17/18 0324  WBC 14.8* 13.7*  HGB 10.0* 10.3*  HCT 31.2* 33.2*  PLT 204 217   BMET Recent Labs    08/16/18 2038 08/17/18 0324  NA 135 137  K 3.8 4.1  CL 109 114*  CO2 20* 15*  GLUCOSE 77 75  BUN 24* 26*  CREATININE 1.23* 1.20*  CALCIUM 7.6* 7.6*   PT/INR Recent Labs    08/15/18 2040  LABPROT 16.4*  INR 1.3*   CMP     Component Value Date/Time   NA 137 08/17/2018 0324   NA 135 09/16/2014 0404   K 4.1 08/17/2018 0324   K 3.8 09/16/2014 0404   CL 114 (H) 08/17/2018 0324   CL 104 09/16/2014 0404   CO2 15 (L) 08/17/2018 0324   CO2 26 09/16/2014 0404   GLUCOSE 75 08/17/2018 0324   GLUCOSE 107 (H) 09/16/2014 0404   BUN 26 (H) 08/17/2018 0324   BUN 19 09/16/2014 0404   CREATININE 1.20 (H) 08/17/2018 0324   CREATININE 0.91 09/16/2014 0404   CALCIUM 7.6 (L) 08/17/2018 0324   CALCIUM 8.7 (L) 09/16/2014 0404   PROT  5.2 (L) 08/17/2018 0324   PROT 6.1 (L) 09/16/2014 0404   ALBUMIN 2.2 (L) 08/17/2018 0324   ALBUMIN 3.6 09/16/2014 0404   AST 68 (H) 08/17/2018 0324   AST 20 09/16/2014 0404   ALT 84 (H) 08/17/2018 0324   ALT 15 09/16/2014 0404   ALKPHOS 114 08/17/2018 0324   ALKPHOS 37 (L) 09/16/2014 0404   BILITOT 1.6 (H) 08/17/2018 0324   BILITOT 0.6 09/16/2014 0404   GFRNONAA 39 (L) 08/17/2018 0324   GFRNONAA 56 (L) 09/16/2014 0404   GFRAA 45 (L) 08/17/2018 0324   GFRAA >60 09/16/2014 0404   Lipase     Component Value Date/Time   LIPASE 107 (H) 08/14/2018 2055       Studies/Results: Ir Perc Cholecystostomy  Result Date: 08/16/2018 INDICATION: Concern for occlusive choledocholithiasis, now with hemodynamic instability and poor candidate for endoscopy. After stabilization in the ICU, request made for placement of a percutaneous biliary drainage catheter for infection source control purposes. EXAM: ULTRASOUND AND FLUOROSCOPIC-GUIDED CHOLECYSTOSTOMY TUBE PLACEMENT COMPARISON:  MRCP - 08/15/2018; 07/06/2018; CT abdomen and pelvis - 08/14/2018 MEDICATIONS: The patient is currently admitted to the hospital and on intravenous antibiotics. Antibiotics were administered within an appropriate time frame prior to skin puncture. ANESTHESIA/SEDATION: Moderate (conscious) sedation was  employed during this procedure. A total of Versed 1.5 mg and Fentanyl 50 mcg was administered intravenously. Moderate Sedation Time: 63 minutes. The patient's level of consciousness and vital signs were monitored continuously by radiology nursing throughout the procedure under my direct supervision. CONTRAST:  37mL OMNIPAQUE IOHEXOL 300 MG/ML SOLN - administered into the biliary tree, hepatic parenchyma and gallbladder fossa. FLUOROSCOPY TIME:  13 minutes, 48 seconds (55 mGy) COMPLICATIONS: None immediate. PROCEDURE: Informed written consent was obtained from the patient and the patient's family after a discussion of the risks,  benefits and alternatives to treatment. Questions regarding the procedure were encouraged and answered. A timeout was performed prior to the initiation of the procedure. The right upper abdominal quadrant was prepped and draped in the usual sterile fashion, and a sterile drape was applied covering the operative field. Maximum barrier sterile technique with sterile gowns and gloves were used for the procedure. A timeout was performed prior to the initiation of the procedure. Local anesthesia was provided with 1% lidocaine with epinephrine. Ultrasound evaluation of the abdomen was negative for any significant dilatation of the peripheral aspect of the biliary tree. Note is made of marked gallbladder wall thickening as well as the presence of known cholelithiasis and gallbladder sludge. Multiple attempts were made to cannulate the biliary tree both from right and left-sided approaches. At one point, there is minimal opacification of the central aspect of the right biliary tree with eventual opacification of the common bile and cystic ducts. Given patient's hemodynamic instability as well as lack of any significant intrahepatic biliary ductal dilatation and patency of the cystic duct the decision was made to proceed with cholecystostomy tube placement. As such, utilizing a transhepatic approach, a 22 gauge needle was advanced into the gallbladder under direct ultrasound guidance. An ultrasound image was saved for documentation purposes. Appropriate intraluminal puncture was confirmed with the efflux of bile and advancement of an 0.018 wire into the gallbladder lumen. The needle was exchanged for an [place] set. A small amount of contrast was injected to confirm appropriate intraluminal positioning. Over a short Amplatz wire, a 10.2-French Cook cholecystomy tube was advanced into the gallbladder fossa, coiled and locked. Bile was aspirated and a small amount of contrast was injected as several post procedural spot  radiographic images were obtained in various obliquities. The catheter was secured to the skin with suture, connected to a drainage bag and a dressing was placed. The patient tolerated the procedure well without immediate post procedural complication. FINDINGS: Sonographic evaluation of the abdomen is negative for any significant intrahepatic biliary duct dilatation. Note is made of gallbladder wall thickening pericholecystic fluid as well as multiple radiopaque gallstones or biliary sludge. Limited percutaneous cholangiogram demonstrates opacification of a moderately dilated common bile duct as well as opacification of the cystic duct. There is no definitive passage of contrast to the distal aspect of the CBD. Given lack of any significant intrahepatic biliary ductal dilatation, as well as patency of the cystic duct and abnormal appearance of the gallbladder, the decision was made to proceed with cholecystostomy tube placement. After image guided placement, the 10 French percutaneous cholecystostomy tube is appropriately positioned with end coiled and locked within the lumen of the gallbladder. IMPRESSION: 1. Attempted though ultimately unsuccessful placement of an internal/external biliary drainage catheter secondary to lack of any significant dilatation of the intrahepatic biliary system. 2. Given abnormal sonographic appearance of the gallbladder as well as limited percutaneous cholangiogram demonstrating patency of the cystic duct, the decision was made to  proceed with image guided cholecystostomy tube placement for infection source control purposes. 3. Successful ultrasound and fluoroscopic guided placement of a 10.2 French cholecystostomy tube. PLAN: - Continued resuscitative measures by the ICU staff. - Maintain chole tube to gravity bag. - Obtain daily CMP to evaluate bilirubin trend. - Diagnostic percutaneous cholangiogram could be performed via the existing cholecystostomy tube following the resolution of  acute symptoms. Above findings discussed with Dr. Nelda Marseille at the time of procedure completion. Electronically Signed   By: Sandi Mariscal M.D.   On: 08/16/2018 19:51    Anti-infectives: Anti-infectives (From admission, onward)   Start     Dose/Rate Route Frequency Ordered Stop   08/16/18 1740  vancomycin (VANCOCIN) 1-5 GM/200ML-% IVPB  Status:  Discontinued    Note to Pharmacy:  Desiree Hane   : cabinet override      08/16/18 1740 08/16/18 1824   08/15/18 2200  cefTRIAXone (ROCEPHIN) 2 g in sodium chloride 0.9 % 100 mL IVPB  Status:  Discontinued     2 g 200 mL/hr over 30 Minutes Intravenous Every 24 hours 08/15/18 1957 08/15/18 2005   08/15/18 2200  piperacillin-tazobactam (ZOSYN) IVPB 3.375 g     3.375 g 12.5 mL/hr over 240 Minutes Intravenous Every 8 hours 08/15/18 2036     08/15/18 2000  metroNIDAZOLE (FLAGYL) IVPB 500 mg  Status:  Discontinued     500 mg 100 mL/hr over 60 Minutes Intravenous Every 8 hours 08/15/18 1957 08/15/18 2005       Assessment/Plan A. Fib with RVR - improving, per primary team.  GERD Diastolic CHF with severe MVR  Possible Acute cholecystitis Choledocholithiasis - s/p IR cholecystostomy tube placement 3/15 - bilious drainage - GI recommending cholangiogram in the next 24-48 h, possible ERCP pending results of this - WBC 13.7 3/17, afebrile - continue abx - LFTs and TBili all down-trending 3/17. Repeat AM labs - Would plan for interval cholecystectomy in 6-8 weeks if patient cleared from a medical and cardiac standpoint  FEN: CLD VTE: SCDs ID: rocephin/flagyl 3/14; zosyn 3/14>>   LOS: 3 days    Jillyn Ledger , [facility] 08/18/2018, 8:55 AM Pager: [phone]

## 2018-08-18 NOTE — Progress Notes (Signed)
Subjective: The patient was seen and examined at bedside. She continues to remain tachycardic with atrial fibrillation. No further drops noted in blood pressure. She is requesting for diet to be advanced.  Objective: Vital signs in last 24 hours: Temp:  [97.4 F (36.3 C)-98.5 F (36.9 C)] 97.4 F (36.3 C) (03/17 0835) Pulse Rate:  [106-130] 107 (03/17 0600) Resp:  [16-27] 18 (03/17 0600) BP: (70-107)/(52-75) 107/65 (03/17 0600) SpO2:  [92 %-99 %] 99 % (03/17 0600) Weight:  [54.5 kg] 54.5 kg (03/17 0437) Weight change: 1.7 kg Last BM Date: (PTA)  XB:JYNWGNF, frail GENERAL:mild pallor, no icterus ABDOMEN:soft, mild generalized tenderness, cholecystostomy tube draining bile EXTREMITIES:no deformity  Lab Results: Results for orders placed or performed during the hospital encounter of 08/15/18 (from the past 48 hour(s))  Hepatic function panel     Status: Abnormal   Collection Time: 08/16/18  9:38 AM  Result Value Ref Range   Total Protein 5.2 (L) 6.5 - 8.1 g/dL   Albumin 2.5 (L) 3.5 - 5.0 g/dL   AST 106 (H) 15 - 41 U/L   ALT 117 (H) 0 - 44 U/L   Alkaline Phosphatase 128 (H) 38 - 126 U/L   Total Bilirubin 2.6 (H) 0.3 - 1.2 mg/dL   Bilirubin, Direct 1.8 (H) 0.0 - 0.2 mg/dL   Indirect Bilirubin 0.8 0.3 - 0.9 mg/dL    Comment: Performed at [facility], [address], [place], [place] [address]  CBC with Differential/Platelet     Status: Abnormal   Collection Time: 08/16/18  9:38 AM  Result Value Ref Range   WBC 14.8 (H) 4.0 - 10.5 K/uL   RBC 3.60 (L) 3.87 - 5.11 MIL/uL   Hemoglobin 10.0 (L) 12.0 - 15.0 g/dL   HCT 31.2 (L) 36.0 - 46.0 %   MCV 86.7 80.0 - 100.0 fL   MCH 27.8 26.0 - 34.0 pg   MCHC 32.1 30.0 - 36.0 g/dL   RDW 13.2 11.5 - 15.5 %   Platelets 204 150 - 400 K/uL   nRBC 0.0 0.0 - 0.2 %   Neutrophils Relative % 87 %   Neutro Abs 13.0 (H) 1.7 - 7.7 K/uL   Lymphocytes Relative 6 %   Lymphs Abs 0.9 0.7 - 4.0 K/uL   Monocytes Relative 5 %   Monocytes  Absolute 0.7 0.1 - 1.0 K/uL   Eosinophils Relative 0 %   Eosinophils Absolute 0.0 0.0 - 0.5 K/uL   Basophils Relative 0 %   Basophils Absolute 0.0 0.0 - 0.1 K/uL   Immature Granulocytes 2 %   Abs Immature Granulocytes 0.22 (H) 0.00 - 0.07 K/uL    Comment: Performed at [facility] [address], [place], [place] [address]  Culture, blood (Routine X 2) w Reflex to ID Panel     Status: None (Preliminary result)   Collection Time: 08/16/18  3:38 PM  Result Value Ref Range   Specimen Description BLOOD RIGHT HAND    Special Requests AEROBIC BOTTLE ONLY Blood Culture adequate volume    Culture      NO GROWTH < 24 HOURS Performed at [facility] [address], [place], [place] [address]    Report Status PENDING   Culture, blood (Routine X 2) w Reflex to ID Panel     Status: None (Preliminary result)   Collection Time: 08/16/18  3:44 PM  Result Value Ref Range   Specimen Description BLOOD LEFT HAND    Special Requests AEROBIC  BOTTLE ONLY Blood Culture adequate volume    Culture      NO GROWTH < 24 HOURS Performed at [facility] [address], [place], [place] [address]    Report Status PENDING   Glucose, capillary     Status: None   Collection Time: 08/16/18  4:10 PM  Result Value Ref Range   Glucose-Capillary 76 70 - 99 mg/dL  MRSA PCR Screening     Status: None   Collection Time: 08/16/18  4:53 PM  Result Value Ref Range   MRSA by PCR NEGATIVE NEGATIVE    Comment:        The GeneXpert MRSA Assay (FDA approved for NASAL specimens only), is one component of a comprehensive MRSA colonization surveillance program. It is not intended to diagnose MRSA infection nor to guide or monitor treatment for MRSA infections. Performed at [facility], [place] [address], [place], [place] [address]   Urinalysis, Routine w reflex microscopic     Status: Abnormal   Collection Time: 08/16/18  8:37 PM  Result Value Ref Range   Color, Urine AMBER (A) YELLOW     Comment: BIOCHEMICALS MAY BE AFFECTED BY COLOR   APPearance HAZY (A) CLEAR   Specific Gravity, Urine 1.040 (H) 1.005 - 1.030   pH 5.0 5.0 - 8.0   Glucose, UA NEGATIVE NEGATIVE mg/dL   Hgb urine dipstick NEGATIVE NEGATIVE   Bilirubin Urine NEGATIVE NEGATIVE   Ketones, ur 20 (A) NEGATIVE mg/dL   Protein, ur 30 (A) NEGATIVE mg/dL   Nitrite NEGATIVE NEGATIVE   Leukocytes,Ua NEGATIVE NEGATIVE   RBC / HPF 0-5 0 - 5 RBC/hpf   WBC, UA 0-5 0 - 5 WBC/hpf   Bacteria, UA NONE SEEN NONE SEEN   Squamous Epithelial / LPF 0-5 0 - 5   Mucus PRESENT     Comment: Performed at [facility], [place] [address] [place], [place] [address]  Urine Culture     Status: Abnormal   Collection Time: 08/16/18  8:37 PM  Result Value Ref Range   Specimen Description URINE, CLEAN CATCH    Special Requests NONE    Culture (A)     <10,000 COLONIES/mL INSIGNIFICANT GROWTH Performed at [facility], [place] [address] [place], [place] [address]    Report Status 08/18/2018 FINAL   Basic metabolic panel     Status: Abnormal   Collection Time: 08/16/18  8:38 PM  Result Value Ref Range   Sodium 135 135 - 145 mmol/L   Potassium 3.8 3.5 - 5.1 mmol/L   Chloride 109 98 - 111 mmol/L   CO2 20 (L) 22 - 32 mmol/L   Glucose, Bld 77 70 - 99 mg/dL   BUN 24 (H) 8 - 23 mg/dL   Creatinine, Ser 1.23 (H) 0.44 - 1.00 mg/dL   Calcium 7.6 (L) 8.9 - 10.3 mg/dL   GFR calc non Af Amer 38 (L) >60 mL/min   GFR calc Af Amer 43 (L) >60 mL/min   Anion gap 6 5 - 15    Comment: Performed at [facility] [address], [place], [place] [address]  Magnesium     Status: None   Collection Time: 08/16/18  8:38 PM  Result Value Ref Range   Magnesium 1.9 1.7 - 2.4 mg/dL    Comment: Performed at [facility], [place] [address], [place],  [address]  Magnesium     Status: None   Collection Time: 08/17/18  3:24 AM  Result Value Ref Range   Magnesium 1.9 1.7 - 2.4 mg/dL    Comment: Performed at [facility] [address], [place], [place] [address]  CBC with Differential/Platelet     Status: Abnormal   Collection Time: 08/17/18  3:24 AM  Result Value Ref Range   WBC 13.7 (H) 4.0 - 10.5 K/uL   RBC 3.64 (L) 3.87 - 5.11 MIL/uL   Hemoglobin 10.3 (L) 12.0 - 15.0 g/dL   HCT 33.2 (L) 36.0 - 46.0 %   MCV 91.2 80.0 - 100.0 fL   MCH 28.3 26.0 - 34.0 pg   MCHC 31.0 30.0 - 36.0 g/dL   RDW 13.4 11.5 - 15.5 %   Platelets 217 150 - 400 K/uL   nRBC 0.0 0.0 - 0.2 %   Neutrophils Relative % 83 %   Neutro Abs 11.3 (H) 1.7 - 7.7 K/uL   Lymphocytes Relative 9 %   Lymphs Abs 1.3 0.7 - 4.0 K/uL   Monocytes Relative 7 %   Monocytes Absolute 0.9 0.1 - 1.0 K/uL   Eosinophils Relative 0 %   Eosinophils Absolute 0.0 0.0 - 0.5 K/uL   Basophils Relative 0 %   Basophils Absolute 0.0 0.0 - 0.1 K/uL   Immature Granulocytes 1 %   Abs Immature Granulocytes 0.15 (H) 0.00 - 0.07 K/uL    Comment: Performed at [facility] [address] [place], [place] [address]  Comprehensive metabolic panel     Status: Abnormal   Collection Time: 08/17/18  3:24 AM  Result Value Ref Range   Sodium 137 135 - 145 mmol/L   Potassium 4.1 3.5 - 5.1 mmol/L   Chloride 114 (H) 98 - 111 mmol/L   CO2 15 (L) 22 - 32 mmol/L   Glucose, Bld 75 70 - 99 mg/dL   BUN 26 (H) 8 - 23 mg/dL   Creatinine, Ser 1.20 (H) 0.44 - 1.00 mg/dL   Calcium 7.6 (L) 8.9 - 10.3 mg/dL   Total Protein 5.2 (L) 6.5 - 8.1 g/dL   Albumin 2.2 (L) 3.5 - 5.0 g/dL   AST 68 (H) 15 - 41 U/L   ALT 84 (H) 0 - 44 U/L   Alkaline Phosphatase 114 38 - 126 U/L   Total Bilirubin 1.6 (H) 0.3 - 1.2 mg/dL   GFR calc non Af Amer 39 (L) >60 mL/min   GFR calc Af Amer 45 (L) >60 mL/min   Anion gap 8 5 - 15    Comment: Performed at [facility], [place] [address] [place], [place] [address]  Magnesium     Status: None   Collection Time: 08/18/18  3:42 AM  Result Value Ref Range   Magnesium 2.0 1.7 - 2.4 mg/dL    Comment: Performed at [place] [address],  [place], [place] [address]    Studies/Results: Ir Perc Cholecystostomy  Result Date: 08/16/2018 INDICATION: Concern for occlusive choledocholithiasis, now with hemodynamic instability and poor candidate for endoscopy. After stabilization in the ICU, request made for placement of a percutaneous biliary drainage catheter for infection source control purposes. EXAM: ULTRASOUND AND FLUOROSCOPIC-GUIDED CHOLECYSTOSTOMY TUBE PLACEMENT COMPARISON:  MRCP - 08/15/2018; 07/06/2018; CT abdomen and pelvis - 08/14/2018 MEDICATIONS: The patient is currently admitted to the hospital and on intravenous antibiotics. Antibiotics were administered within an appropriate time frame prior to skin puncture. ANESTHESIA/SEDATION: Moderate (conscious) sedation was employed during this procedure. A total of Versed 1.5 mg and Fentanyl  50 mcg was administered intravenously. Moderate Sedation Time: 63 minutes. The patient's level of consciousness and vital signs were monitored continuously by radiology nursing throughout the procedure under my direct supervision. CONTRAST:  24mL OMNIPAQUE IOHEXOL 300 MG/ML SOLN - administered into the biliary tree, hepatic parenchyma and gallbladder fossa. FLUOROSCOPY TIME:  13 minutes, 48 seconds (55 mGy) COMPLICATIONS: None immediate. PROCEDURE: Informed written consent was obtained from the patient and the patient's family after a discussion of the risks, benefits and alternatives to treatment. Questions regarding the procedure were encouraged and answered. A timeout was performed prior to the initiation of the procedure. The right upper abdominal quadrant was prepped and draped in the usual sterile fashion, and a sterile drape was applied covering the operative field. Maximum barrier sterile technique with sterile gowns and gloves were used for the procedure. A timeout was performed prior to the initiation of the procedure. Local anesthesia was provided with 1% lidocaine with epinephrine. Ultrasound  evaluation of the abdomen was negative for any significant dilatation of the peripheral aspect of the biliary tree. Note is made of marked gallbladder wall thickening as well as the presence of known cholelithiasis and gallbladder sludge. Multiple attempts were made to cannulate the biliary tree both from right and left-sided approaches. At one point, there is minimal opacification of the central aspect of the right biliary tree with eventual opacification of the common bile and cystic ducts. Given patient's hemodynamic instability as well as lack of any significant intrahepatic biliary ductal dilatation and patency of the cystic duct the decision was made to proceed with cholecystostomy tube placement. As such, utilizing a transhepatic approach, a 22 gauge needle was advanced into the gallbladder under direct ultrasound guidance. An ultrasound image was saved for documentation purposes. Appropriate intraluminal puncture was confirmed with the efflux of bile and advancement of an 0.018 wire into the gallbladder lumen. The needle was exchanged for an [place] set. A small amount of contrast was injected to confirm appropriate intraluminal positioning. Over a short Amplatz wire, a 10.2-French Cook cholecystomy tube was advanced into the gallbladder fossa, coiled and locked. Bile was aspirated and a small amount of contrast was injected as several post procedural spot radiographic images were obtained in various obliquities. The catheter was secured to the skin with suture, connected to a drainage bag and a dressing was placed. The patient tolerated the procedure well without immediate post procedural complication. FINDINGS: Sonographic evaluation of the abdomen is negative for any significant intrahepatic biliary duct dilatation. Note is made of gallbladder wall thickening pericholecystic fluid as well as multiple radiopaque gallstones or biliary sludge. Limited percutaneous cholangiogram demonstrates opacification  of a moderately dilated common bile duct as well as opacification of the cystic duct. There is no definitive passage of contrast to the distal aspect of the CBD. Given lack of any significant intrahepatic biliary ductal dilatation, as well as patency of the cystic duct and abnormal appearance of the gallbladder, the decision was made to proceed with cholecystostomy tube placement. After image guided placement, the 10 French percutaneous cholecystostomy tube is appropriately positioned with end coiled and locked within the lumen of the gallbladder. IMPRESSION: 1. Attempted though ultimately unsuccessful placement of an internal/external biliary drainage catheter secondary to lack of any significant dilatation of the intrahepatic biliary system. 2. Given abnormal sonographic appearance of the gallbladder as well as limited percutaneous cholangiogram demonstrating patency of the cystic duct, the decision was made to proceed with image guided cholecystostomy tube placement for infection source control purposes.  3. Successful ultrasound and fluoroscopic guided placement of a 10.2 French cholecystostomy tube. PLAN: - Continued resuscitative measures by the ICU staff. - Maintain chole tube to gravity bag. - Obtain daily CMP to evaluate bilirubin trend. - Diagnostic percutaneous cholangiogram could be performed via the existing cholecystostomy tube following the resolution of acute symptoms. Above findings discussed with Dr. Nelda Marseille at the time of procedure completion. Electronically Signed   By: Sandi Mariscal M.D.   On: 08/16/2018 19:51    Medications: I have reviewed the patient's current medications.  Assessment: Status post emergent percutaneous cholecystostomy tube placement for cholangitis on presentation Liver enzymes trending down T bili 1.6/AST 68/AST 84/ALP 114 Atrial fibrillation with rapid ventricular rate Renal impairment to be when 26/creatinine 1.2/GFR 39 Improvement in leukocytosis  Plan: Patient  currently on clear liquid diet, requesting for her to be advanced Recommend cholangiogram to be performed via percutaneous cholecystostomy tube to evaluate the CBD to decide if patient needs ERCP Heart rate continues to be of 110, atrial fibrillation, started on flecainide,metoprolol Continue IV Zosyn If cholangiogram reveals Stone in CBD, will need to proceed with ERCP if hemodynamics allow.   [name] 08/18/2018, 8:44 AM   Pager [phone] If no answer or after 5 PM call [phone]

## 2018-08-19 LAB — CBC
HCT: 33.1 % — ABNORMAL LOW (ref 36.0–46.0)
Hemoglobin: 11.3 g/dL — ABNORMAL LOW (ref 12.0–15.0)
MCH: 29 pg (ref 26.0–34.0)
MCHC: 34.1 g/dL (ref 30.0–36.0)
MCV: 84.9 fL (ref 80.0–100.0)
Platelets: 288 10*3/uL (ref 150–400)
RBC: 3.9 MIL/uL (ref 3.87–5.11)
RDW: 13.3 % (ref 11.5–15.5)
WBC: 8.8 10*3/uL (ref 4.0–10.5)
nRBC: 0 % (ref 0.0–0.2)

## 2018-08-19 LAB — COMPREHENSIVE METABOLIC PANEL
ALK PHOS: 93 U/L (ref 38–126)
ALT: 46 U/L — ABNORMAL HIGH (ref 0–44)
AST: 22 U/L (ref 15–41)
Albumin: 2.2 g/dL — ABNORMAL LOW (ref 3.5–5.0)
Anion gap: 6 (ref 5–15)
BUN: 16 mg/dL (ref 8–23)
CO2: 20 mmol/L — ABNORMAL LOW (ref 22–32)
Calcium: 8.6 mg/dL — ABNORMAL LOW (ref 8.9–10.3)
Chloride: 109 mmol/L (ref 98–111)
Creatinine, Ser: 0.97 mg/dL (ref 0.44–1.00)
GFR calc Af Amer: 58 mL/min — ABNORMAL LOW (ref >60)
GFR calc non Af Amer: 50 mL/min — ABNORMAL LOW (ref >60)
Glucose, Bld: 99 mg/dL (ref 70–99)
Potassium: 3.8 mmol/L (ref 3.5–5.1)
Sodium: 135 mmol/L (ref 135–145)
Total Bilirubin: 1.2 mg/dL (ref 0.3–1.2)
Total Protein: 5.2 g/dL — ABNORMAL LOW (ref 6.5–8.1)

## 2018-08-19 LAB — MAGNESIUM: Magnesium: 1.7 mg/dL (ref 1.7–2.4)

## 2018-08-19 MED ORDER — METOPROLOL TARTRATE 25 MG PO TABS
25.0000 mg | ORAL_TABLET | Freq: Three times a day (TID) | ORAL | Status: DC
  Administered 2018-08-19 – 2018-08-21 (×6): 25 mg via ORAL
  Filled 2018-08-19 (×7): qty 1

## 2018-08-19 MED ORDER — OXYCODONE HCL 5 MG PO TABS: 5.0000 mg | ORAL_TABLET | Freq: Three times a day (TID) | ORAL | Status: DC | PRN

## 2018-08-19 MED ORDER — AMIODARONE HCL IN DEXTROSE 360-4.14 MG/200ML-% IV SOLN
30.0000 mg/h | INTRAVENOUS | Status: DC
  Administered 2018-08-19 – 2018-08-23 (×9): 30 mg/h via INTRAVENOUS
  Filled 2018-08-19 (×10): qty 200

## 2018-08-19 MED ORDER — AMIODARONE HCL IN DEXTROSE 360-4.14 MG/200ML-% IV SOLN
60.0000 mg/h | INTRAVENOUS | Status: DC
  Administered 2018-08-19 (×2): 60 mg/h via INTRAVENOUS
  Filled 2018-08-19 (×2): qty 200

## 2018-08-19 MED ORDER — AMIODARONE LOAD VIA INFUSION
150.0000 mg | Freq: Once | INTRAVENOUS | Status: AC
  Administered 2018-08-19: 150 mg via INTRAVENOUS
  Filled 2018-08-19: qty 83.34

## 2018-08-19 NOTE — Progress Notes (Addendum)
Subjective: The patient was seen and examined at bedside. Complains of discomfort near the cholecystostomy drain site, denies abdominal pain. Continues to remain tachycardic with heart rate between 120 to 140/min.  Objective: Vital signs in last 24 hours: Temp:  [98.4 F (36.9 C)-98.5 F (36.9 C)] 98.5 F (36.9 C) (03/18 0440) Pulse Rate:  [81-132] 121 (03/18 0440) Resp:  [13-23] 23 (03/17 1643) BP: (106-119)/(73-89) 110/76 (03/18 0440) SpO2:  [95 %-99 %] 95 % (03/18 0440) Weight:  [55.9 kg] 55.9 kg (03/18 0440) Weight change: 1.429 kg Last BM Date: 08/18/18  PE: Elderly, thinly built, frail GENERAL: Not in distress, no icterus ABDOMEN: Cholecystostomy tube draining small amount of bile EXTREMITIES: no Deformity  Lab Results: Results for orders placed or performed during the hospital encounter of 08/15/18 (from the past 48 hour(s))  Magnesium     Status: None   Collection Time: 08/18/18  3:42 AM  Result Value Ref Range   Magnesium 2.0 1.7 - 2.4 mg/dL    Comment: Performed at [facility], [place] [address] [place], [place] [address]  Magnesium     Status: None   Collection Time: 08/19/18  3:10 AM  Result Value Ref Range   Magnesium 1.7 1.7 - 2.4 mg/dL    Comment: Performed at [facility] [address], [place], [place] [address]  Comprehensive metabolic panel     Status: Abnormal   Collection Time: 08/19/18  3:10 AM  Result Value Ref Range   Sodium 135 135 - 145 mmol/L   Potassium 3.8 3.5 - 5.1 mmol/L   Chloride 109 98 - 111 mmol/L   CO2 20 (L) 22 - 32 mmol/L   Glucose, Bld 99 70 - 99 mg/dL   BUN 16 8 - 23 mg/dL   Creatinine, Ser 0.97 0.44 - 1.00 mg/dL   Calcium 8.6 (L) 8.9 - 10.3 mg/dL   Total Protein 5.2 (L) 6.5 - 8.1 g/dL   Albumin 2.2 (L) 3.5 - 5.0 g/dL   AST 22 15 - 41 U/L   ALT 46 (H) 0 - 44 U/L   Alkaline Phosphatase 93 38 - 126 U/L   Total Bilirubin 1.2 0.3 - 1.2 mg/dL   GFR calc non Af Amer 50 (L) >60 mL/min   GFR calc Af Amer 58 (L) >60  mL/min   Anion gap 6 5 - 15    Comment: Performed at [facility], [place] [address] [place], [place] [address]  CBC     Status: Abnormal   Collection Time: 08/19/18  3:10 AM  Result Value Ref Range   WBC 8.8 4.0 - 10.5 K/uL   RBC 3.90 3.87 - 5.11 MIL/uL   Hemoglobin 11.3 (L) 12.0 - 15.0 g/dL   HCT 33.1 (L) 36.0 - 46.0 %   MCV 84.9 80.0 - 100.0 fL    Comment: REPEATED TO VERIFY   MCH 29.0 26.0 - 34.0 pg   MCHC 34.1 30.0 - 36.0 g/dL   RDW 13.3 11.5 - 15.5 %   Platelets 288 150 - 400 K/uL   nRBC 0.0 0.0 - 0.2 %    Comment: Performed at [facility], [place] [address] [place], [place] [address]    Studies/Results: Ir Cholangiogram Existing Tube  Result Date: 08/18/2018 INDICATION: Biliary ductal dilatation status post decompressive cholecystostomy catheter placement. EXAM: CHOLANGIOGRAM THROUGH EXISTING CATHETER MEDICATIONS: None indicated ANESTHESIA/SEDATION: None required FLUOROSCOPY TIME:  42 seconds; 5 mGy COMPLICATIONS: None immediate. PROCEDURE: Informed written consent was obtained  from the patient and family after a thorough discussion of the procedural risks, benefits and alternatives. All questions were addressed. Maximal Sterile Barrier Technique was utilized including caps, mask, sterile gowns, sterile gloves, sterile drape, hand hygiene and skin antiseptic. A timeout was performed prior to the initiation of the procedure. Scout radiograph shows stable position of the cholecystostomy catheter. Contrast injection shows normal filling of the gallbladder. Cystic duct is patent. No filling defects in the CBD. There is a short segment eccentric shelf-like area of high-grade stenosis in the distal CBD just proximal to the ampulla. A small amount of contrast traverses this area into the duodenum. Partial opacification of the pancreatic duct in the head of the pancreas, which is nondilated. IMPRESSION: 1. 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 Consider  endoscopic evaluation and brush biopsy to exclude neoplasm. Electronically Signed   By: Lucrezia Europe M.D.   On: 08/18/2018 16:49    Medications: I have reviewed the patient's current medications.  Assessment: High-grade short segment shelflike stenosis noted in distal CBD on cholangiogram yesterday LFTs near normalize at T bili 1.2/AST 22/ALT 46/ALP 93 No leukocytosis Provement in renal function  However, patient continues to have persistent atrial fibrillation with tachycardia with blood pressure in 90s.  Plan: Patient being followed up by cardiology with plans to start IV amiodarone. Once hemodynamics are more stable, and we receive clearance from cardiology, plan diagnostic ERCP. Discussed the same with the patient, her daughter and 2 grandsons at bedside in length. Meanwhile we will start patient on low-fat diet if tolerated, with plans to keep her in p.o. if there are improvement in her hemodynamics, in anticipation of ERCP.   [name] 08/19/2018, 10:58 AM   Pager [phone] If no answer or after 5 PM call [phone]

## 2018-08-19 NOTE — Progress Notes (Addendum)
[facility]  PROGRESS NOTE  Kylie May IOE:[id number] DOB: 20-Jul-1921 DOA: 08/15/2018 PCP: [name], MD   Brief HPI:   85 year old female with a history of atrial fibrillation who came with CBD stone resulting in septic shock.  GI did not feel comfortable taking her to endoscopy due to cardiopulmonary instability.  General surgery also felt that she was unstable for surgery.  PCCM was consulted for stabilization prior to consideration for IR drainage.  Patient underwent cholecystostomy tube placement and now planned to undergo ERCP once her atrial fibrillation is under control.    Subjective   Patient seen and examined, denies any complaints.   Assessment/Plan:     1. Septic shock-secondary to acute cholecystitis with obstructing CBD stone, resolved, patient is on Zosyn.  Percutaneous IR drain placed on 08/16/2018.  And for ERCP per GI once heart rate is controlled.  2. Chronic atrial fibrillation-patient currently having acute RVR, Eliquis on hold for ERCP.  Amiodarone was stopped in outpatient setting due to elevated TSH.  Cardiology recommending to resume amiodarone but family is reluctant.  If no improvement with flecainide, family is agreeable to restart amiodarone.  3. Chronic diastolic CHF-no signs of volume overload.  4. Acute kidney injury-secondary to septic shock, improving with hydration.  Creatinine is 1.20.     CBG: Recent Labs  Lab 08/15/18 2203 08/16/18 0413 08/16/18 1610  GLUCAP 99 82 76    CBC: Recent Labs  Lab 08/14/18 2055 08/15/18 1413 08/15/18 2040 08/16/18 0938 08/17/18 0324 08/19/18 0310  WBC 17.9*  18.1* 16.8* 15.0* 14.8* 13.7* 8.8  NEUTROABS 15.6*  --  12.9* 13.0* 11.3*  --   HGB 11.0*  11.0* 9.8* 9.6* 10.0* 10.3* 11.3*  HCT 33.5*  33.6* 29.2* 29.9* 31.2* 33.2* 33.1*  MCV 88.9  88.9 86.6 87.9 86.7 91.2 84.9  PLT 292  284 216 203 204 217 182    Basic Metabolic Panel: Recent Labs  Lab 08/15/18 2040  08/16/18 0428 08/16/18 2038 08/17/18 0324 08/18/18 0342 08/19/18 0310  NA 133* 135 135 137  --  135  K 3.8 3.8 3.8 4.1  --  3.8  CL 103 106 109 114*  --  109  CO2 21* 19* 20* 15*  --  20*  GLUCOSE 99 88 77 75  --  99  BUN 17 16 24* 26*  --  16  CREATININE 1.36* 1.17* 1.23* 1.20*  --  0.97  CALCIUM 7.9* 8.0* 7.6* 7.6*  --  8.6*  MG 1.1* 2.0 1.9 1.9 2.0 1.7     DVT prophylaxis: SCDs  Code Status: Limited code  Family Communication: Discussed with family at bedside  Disposition Plan: likely home when medically ready for discharge     Consultants:  General surgery  Intervention radiology  Procedures:  Cholecystostomy tube placement   Antibiotics:   Anti-infectives (From admission, onward)   Start     Dose/Rate Route Frequency Ordered Stop   08/16/18 1740  vancomycin (VANCOCIN) 1-5 GM/200ML-% IVPB  Status:  Discontinued    Note to Pharmacy:  Desiree Hane   : cabinet override      08/16/18 1740 08/16/18 1824   08/15/18 2200  cefTRIAXone (ROCEPHIN) 2 g in sodium chloride 0.9 % 100 mL IVPB  Status:  Discontinued     2 g 200 mL/hr over 30 Minutes Intravenous Every 24 hours 08/15/18 1957 08/15/18 2005   08/15/18 2200  piperacillin-tazobactam (ZOSYN) IVPB 3.375 g     3.375 g 12.5 mL/hr over 240  Minutes Intravenous Every 8 hours 08/15/18 2036     08/15/18 2000  metroNIDAZOLE (FLAGYL) IVPB 500 mg  Status:  Discontinued     500 mg 100 mL/hr over 60 Minutes Intravenous Every 8 hours 08/15/18 1957 08/15/18 2005       Objective   Vitals:   08/19/18 0440 08/19/18 1119 08/19/18 1430 08/19/18 1616  BP: 110/76 102/69 (!) 118/94 117/83  Pulse: (!) 121  (!) 121   Resp:  (!) 24  (!) 23  Temp: 98.5 F (36.9 C) 97.9 F (36.6 C)  98.3 F (36.8 C)  TempSrc:  Oral  Oral  SpO2: 95%   97%  Weight: 55.9 kg     Height:        Intake/Output Summary (Last 24 hours) at 08/19/2018 1721 Last data filed at 08/19/2018 1600 Gross per 24 hour  Intake 992.5 ml  Output 202 ml   Net 790.5 ml   Filed Weights   08/17/18 0424 08/18/18 0437 08/19/18 0440  Weight: 52.8 kg 54.5 kg 55.9 kg     Physical Examination:    General: Appears in no acute distress  Cardiovascular: S1-S2, regular, no murmur auscultated  Respiratory: Clear to auscultation bilaterally  Abdomen: Abdomen is soft mild tenderness in right upper quadrant  Extremities: No edema of the lower extremities  Neurologic: Alert, oriented x3, no focal deficit noted     Data Reviewed: I have personally reviewed following labs and imaging studies   Recent Results (from the past 240 hour(s))  Culture, blood (routine x 2)     Status: None (Preliminary result)   Collection Time: 08/15/18  3:25 PM  Result Value Ref Range Status   Specimen Description BLOOD RIGHT [facility] [place]  Final   Special Requests BOTTLES DRAWN AEROBIC AND ANAEROBIC  Final   Culture   Final    NO GROWTH 4 DAYS Performed at [facility], [address] [place], [place] [address]    Report Status PENDING  Incomplete  Culture, blood (routine x 2)     Status: None (Preliminary result)   Collection Time: 08/15/18  3:25 PM  Result Value Ref Range Status   Specimen Description BLOOD RFOA  Final   Special Requests BOTTLES DRAWN AEROBIC AND ANAEROBIC  Final   Culture   Final    NO GROWTH 4 DAYS Performed at [facility], [address], [place], [place] [address]    Report Status PENDING  Incomplete  Culture, blood (Routine X 2) w Reflex to ID Panel     Status: None (Preliminary result)   Collection Time: 08/16/18  3:38 PM  Result Value Ref Range Status   Specimen Description BLOOD RIGHT HAND  Final   Special Requests AEROBIC BOTTLE ONLY Blood Culture adequate volume  Final   Culture   Final    NO GROWTH 3 DAYS Performed at [facility], [place] [address], [place], [place] [address]    Report Status PENDING  Incomplete  Culture, blood (Routine X 2) w Reflex to ID Panel     Status: None (Preliminary result)    Collection Time: 08/16/18  3:44 PM  Result Value Ref Range Status   Specimen Description BLOOD LEFT HAND  Final   Special Requests AEROBIC BOTTLE ONLY Blood Culture adequate volume  Final   Culture   Final    NO GROWTH 3 DAYS Performed at [facility], [place] [address] [place], [place] [address]    Report Status PENDING  Incomplete  MRSA PCR Screening     Status: None   Collection Time: 08/16/18  4:53 PM  Result Value Ref Range Status   MRSA by PCR NEGATIVE NEGATIVE Final    Comment:        The GeneXpert MRSA Assay (FDA approved for NASAL specimens only), is one component of a comprehensive MRSA colonization surveillance program. It is not intended to diagnose MRSA infection nor to guide or monitor treatment for MRSA infections. Performed at [facility], [place] [address] [place], [place] [address]   Urine Culture     Status: Abnormal   Collection Time: 08/16/18  8:37 PM  Result Value Ref Range Status   Specimen Description URINE, CLEAN CATCH  Final   Special Requests NONE  Final   Culture (A)  Final    <10,000 COLONIES/mL INSIGNIFICANT GROWTH Performed at [facility], [place] [address] [place], [place] [address]    Report Status 08/18/2018 FINAL  Final     Liver Function Tests: Recent Labs  Lab 08/15/18 1413 08/15/18 2040 08/16/18 0938 08/17/18 0324 08/19/18 0310  AST 242* 180* 106* 68* 22  ALT 182* 153* 117* 84* 46*  ALKPHOS 138* 136* 128* 114 93  BILITOT 3.8* 3.6* 2.6* 1.6* 1.2  PROT 6.0* 5.6* 5.2* 5.2* 5.2*  ALBUMIN 2.9* 2.7* 2.5* 2.2* 2.2*   Recent Labs  Lab 08/14/18 2055  LIPASE 107*   No results for input(s): AMMONIA in the last 168 hours.  Cardiac Enzymes: Recent Labs  Lab 08/14/18 2055  TROPONINI <0.03   BNP (last 3 results) Recent Labs    08/15/18 2040  BNP 1,005.7*    ProBNP (last 3 results) No results for input(s): PROBNP in the last 8760 hours.    Studies: Ir Cholangiogram Existing Tube  Result Date:  08/18/2018 INDICATION: Biliary ductal dilatation status post decompressive cholecystostomy catheter placement. EXAM: CHOLANGIOGRAM THROUGH EXISTING CATHETER MEDICATIONS: None indicated ANESTHESIA/SEDATION: None required FLUOROSCOPY TIME:  42 seconds; 5 mGy COMPLICATIONS: None immediate. PROCEDURE: Informed written consent was obtained from the patient and family after a thorough discussion of the procedural risks, benefits and alternatives. All questions were addressed. Maximal Sterile Barrier Technique was utilized including caps, mask, sterile gowns, sterile gloves, sterile drape, hand hygiene and skin antiseptic. A timeout was performed prior to the initiation of the procedure. Scout radiograph shows stable position of the cholecystostomy catheter. Contrast injection shows normal filling of the gallbladder. Cystic duct is patent. No filling defects in the CBD. There is a short segment eccentric shelf-like area of high-grade stenosis in the distal CBD just proximal to the ampulla. A small amount of contrast traverses this area into the duodenum. Partial opacification of the pancreatic duct in the head of the pancreas, which is nondilated. IMPRESSION: 1. 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 Consider endoscopic evaluation and brush biopsy to exclude neoplasm. Electronically Signed   By: Lucrezia Europe M.D.   On: 08/18/2018 16:49    Scheduled Meds: . mouth rinse  15 mL Mouth Rinse BID  . metoprolol tartrate  25 mg Oral Q8H  . sodium chloride flush  5 mL Intracatheter Q8H    Admission status: Inpatient: Based on patients clinical presentation and evaluation of above clinical data, I have made determination that patient meets Inpatient criteria at this time.  Time spent: 30 min  [facility] Pager [phone]. If 7PM-7AM, please contact night-coverage at [email or web address], Office  [phone]  password [facility]  08/19/2018, 5:21 PM  LOS: 4 days

## 2018-08-19 NOTE — Progress Notes (Signed)
Yesterday's events and cholangiogram reviewed. Likely will need further eval w ERCP when medically stable. No plans for lap chole this admission, will continue to follow peripherally.

## 2018-08-19 NOTE — Progress Notes (Signed)
  Amiodarone Drug - Drug Interaction Consult Note  Recommendations:   Flecainide discontinued, last dose ~10:30pm on 3/17.  Amiodarone can increase Flecainide effects, so may slow elimination. Flecainide half-life averages ~20 hours.  K+ 3.8, Mag 1.7.   Continue to monitor HR with concurrent metoprolol.  Amiodarone is metabolized by the cytochrome P450 system and therefore has the potential to cause many drug interactions. Amiodarone has an average plasma half-life of 50 days (range 20 to 100 days).   There is potential for drug interactions to occur several weeks or months after stopping treatment and the onset of drug interactions may be slow after initiating amiodarone.   [x]  Statins: Increased risk of myopathy. Simvastatin- restrict dose to 20mg  daily. Other statins: counsel patients to report any muscle pain or weakness immediately.   - PTA Crestor 20 mg daily on hold, LFTs normalized today  [x]  Anticoagulants: Amiodarone can increase anticoagulant effect. Consider warfarin dose reduction. Patients should be monitored closely and the dose of anticoagulant altered accordingly, remembering that amiodarone levels take several weeks to stabilze                - PTA Eliquis on hold  [x]  Beta blockers: increased risk of bradycardia, AV block and myocardial depression.                     - Sotalol - avoid concomitant use.                -  Metoprolol dose increased today.  Was on Carvedilol prior to admission  []  Diuretics: increased risk of cardiotoxicity if hypokalemia occurs.  [x]  Drugs that prolong the QT interval:  Torsades de pointes risk may be increased with concurrent use - avoid if possible.  Monitor QTc, also keep magnesium/potassium WNL if concurrent therapy can't be avoided. [name] Antibiotics: e.g. fluoroquinolones, erythromycin. . Antiarrhythmics: e.g. quinidine, procainamide, disopyramide, sotalol. . Antipsychotics: e.g. phenothiazines, haloperidol.  . Lithium, tricyclic  antidepressants, and methadone.                - prn Zofran, used one dose on 3/17 pm.  QTc 438 on 08/15/18  Arty Baumgartner, [place] Pager: [phone] or phone: [phone] 08/19/2018 10:32 AM

## 2018-08-19 NOTE — Progress Notes (Addendum)
Progress Note  Patient Name: Kylie May Date of Encounter: 08/19/2018  Primary Cardiologist: Ida Rogue, MD   Subjective   Pt denies feeling fast heart beat. She complains of right side pain with movement due to drain in place.  HR occ down to the 80's but mostly sustaining in the 120's.   Inpatient Medications    Scheduled Meds:  flecainide  50 mg Oral Q12H   mouth rinse  15 mL Mouth Rinse BID   metoprolol tartrate  12.5 mg Oral Q8H   sodium chloride flush  5 mL Intracatheter Q8H   Continuous Infusions:  sodium chloride 75 mL/hr at 08/19/18 0523   piperacillin-tazobactam (ZOSYN)  IV 3.375 g (08/19/18 0528)   PRN Meds: acetaminophen, diphenhydrAMINE, ondansetron (ZOFRAN) IV, oxyCODONE, white petrolatum   Vital Signs    Vitals:   08/18/18 1643 08/18/18 2025 08/19/18 0024 08/19/18 0440  BP: 109/79 119/73 113/77 110/76  Pulse: 81 (!) 132 (!) 102 (!) 121  Resp: (!) 23     Temp: 98.5 F (36.9 C) 98.4 F (36.9 C) 98.4 F (36.9 C) 98.5 F (36.9 C)  TempSrc: Oral     SpO2: 97% 97% 96% 95%  Weight:    55.9 kg  Height:        Intake/Output Summary (Last 24 hours) at 08/19/2018 0921 Last data filed at 08/19/2018 0622 Gross per 24 hour  Intake 966.01 ml  Output 200 ml  Net 766.01 ml   Last 3 Weights 08/19/2018 08/18/2018 08/17/2018  Weight (lbs) 123 lb 4.8 oz 120 lb 2.4 oz 116 lb 6.5 oz  Weight (kg) 55.929 kg 54.5 kg 52.8 kg      Telemetry    Atrial fibrillation in the 120's, occ dips into 80's-90's - Personally Reviewed  ECG    No new tracings - Personally Reviewed  Physical Exam   GEN: Frail, elderly female, very hard of hearing. No acute distress.   Neck: No JVD Cardiac: Irregularly irregular rhythm, no murmurs, rubs, or gallops.  Respiratory: Clear to auscultation bilaterally. GI: Soft, non-distended, drain present on right side with tenderness at site MS: No edema; No deformity. Neuro:  Nonfocal  Psych: Normal affect   Labs      Chemistry Recent Labs  Lab 08/16/18 0938 08/16/18 2038 08/17/18 0324 08/19/18 0310  NA  --  135 137 135  K  --  3.8 4.1 3.8  CL  --  109 114* 109  CO2  --  20* 15* 20*  GLUCOSE  --  77 75 99  BUN  --  24* 26* 16  CREATININE  --  1.23* 1.20* 0.97  CALCIUM  --  7.6* 7.6* 8.6*  PROT 5.2*  --  5.2* 5.2*  ALBUMIN 2.5*  --  2.2* 2.2*  AST 106*  --  68* 22  ALT 117*  --  84* 46*  ALKPHOS 128*  --  114 93  BILITOT 2.6*  --  1.6* 1.2  GFRNONAA  --  38* 39* 50*  GFRAA  --  43* 45* 58*  ANIONGAP  --  6 8 6      Hematology Recent Labs  Lab 08/16/18 0938 08/17/18 0324 08/19/18 0310  WBC 14.8* 13.7* 8.8  RBC 3.60* 3.64* 3.90  HGB 10.0* 10.3* 11.3*  HCT 31.2* 33.2* 33.1*  MCV 86.7 91.2 84.9  MCH 27.8 28.3 29.0  MCHC 32.1 31.0 34.1  RDW 13.2 13.4 13.3  PLT 204 217 288    Cardiac Enzymes Recent Labs  Lab 08/14/18 2055  TROPONINI <0.03   No results for input(s): TROPIPOC in the last 168 hours.   BNP Recent Labs  Lab 08/15/18 2040  BNP 1,005.7*     DDimer No results for input(s): DDIMER in the last 168 hours.   Radiology    Ir Cholangiogram Existing Tube  Result Date: 08/18/2018 INDICATION: Biliary ductal dilatation status post decompressive cholecystostomy catheter placement. EXAM: CHOLANGIOGRAM THROUGH EXISTING CATHETER MEDICATIONS: None indicated ANESTHESIA/SEDATION: None required FLUOROSCOPY TIME:  42 seconds; 5 mGy COMPLICATIONS: None immediate. PROCEDURE: Informed written consent was obtained from the patient and family after a thorough discussion of the procedural risks, benefits and alternatives. All questions were addressed. Maximal Sterile Barrier Technique was utilized including caps, mask, sterile gowns, sterile gloves, sterile drape, hand hygiene and skin antiseptic. A timeout was performed prior to the initiation of the procedure. Scout radiograph shows stable position of the cholecystostomy catheter. Contrast injection shows normal filling of the  gallbladder. Cystic duct is patent. No filling defects in the CBD. There is a short segment eccentric shelf-like area of high-grade stenosis in the distal CBD just proximal to the ampulla. A small amount of contrast traverses this area into the duodenum. Partial opacification of the pancreatic duct in the head of the pancreas, which is nondilated. IMPRESSION: 1. 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 Consider endoscopic evaluation and brush biopsy to exclude neoplasm. Electronically Signed   By: Lucrezia Europe M.D.   On: 08/18/2018 16:49    Cardiac Studies   Echocardiogram 08/17/2018 IMPRESSIONS  1. The left ventricle has normal systolic function, with an ejection fraction of 60-65%. The cavity size was normal. Left ventricular diastolic function could not be evaluated secondary to atrial fibrillation. No evidence of left ventricular regional  wall motion abnormalities.  2. The right ventricle has normal systolc function. The cavity was normal. There is no increase in right ventricular wall thickness. Right ventricular systolic pressure is mildly elevated with an estimated pressure of 43.5 mmHg.  3. Left atrial size was moderately dilated.  4. Right atrial size was moderately dilated.  5. Small pericardial effusion.  6. The pericardial effusion is circumferential.  7. The aortic valve is tricuspid Mild thickening of the aortic valve Mild calcification of the aortic valve.  8. The inferior vena cava was dilated in size with <50% respiratory variability.  FINDINGS  Left Ventricle: The left ventricle has normal systolic function, with an ejection fraction of 60-65%. The cavity size was normal. There is no increase in left ventricular wall thickness. Left ventricular diastolic function could not be evaluated  secondary to atrial fibrillation. No evidence of left ventricular regional wall motion abnormalities. Right Ventricle: The right ventricle has normal systolic function. The  cavity was normal. There is no increase in right ventricular wall thickness. Right ventricular systolic pressure is mildly elevated with an estimated pressure of 43.5 mmHg. Left Atrium: Left atrial size was moderately dilated. Right Atrium: Right atrial size was moderately dilated. Right atrial pressure is estimated at 15 mmHg. Interatrial Septum: No atrial level shunt detected by color flow Doppler. Pericardium: A small pericardial effusion is present. The pericardial effusion is circumferential. Mitral Valve: The mitral valve is normal in structure. Mitral valve regurgitation is mild by color flow Doppler. Tricuspid Valve: The tricuspid valve was normal in structure. Tricuspid valve regurgitation is mild by color flow Doppler. Aortic Valve: The aortic valve is tricuspid Mild thickening of the aortic valve Mild calcification of the aortic valve. Aortic valve regurgitation was  not visualized by color flow Doppler. There is no evidence of aortic valve stenosis. Pulmonic Valve: The pulmonic valve was grossly normal. Pulmonic valve regurgitation was not assessed by color flow Doppler. Pulmonary Artery: The pulmonary artery is not well seen. Venous: The inferior vena cava is dilated in size with less than 50% respiratory variability.   Patient Profile     85 y.o. female with a hx of paroxysmal asymptomaticatrial fibrillationon Eliquis,mild diastolic CHF,HTN, hiatal hernia, osteopenia, degenerative joint disease, scoliosis, hearing lossadmitted with acute cholecystitis/ choledocholithiasis, complicated by persistent atrial fibrillation with RVR, difficult to rate control due to low BP.   Assessment & Plan    Atrial fibrillation -Continues to be persistent on flecainide 50 mg after 3 doses. Has not yet had dose today.  On low dose metoprolol. Rates mostly sustained in the 120's.  -BP in the 90's yesterday, 110's today. May support increase in BB. Dr. Sallyanne Kuster has preference for amiodarone due to  soft BP. Will likely move to [facility] today. Will discuss with Dr. Sallyanne Kuster.  -Lungs clear, no JVD or pedal edema although she has left arm edema (?prior IV infiltrate). Wt is up 3 lbs from yesterday and 4 lbs from day before. Family says pt is taking in oral liquids. Will decrease IV fluids to [facility]. Will let primary address IV fluids further.   Valvular heart disease -Previous report of severe MR/TR. Echo done on 08/17/18 showed only mild MR/TR.   Biliary obstruction/infection with CBD stone -improving clinically on IV antibiotics. Percutaneous drain placed on 3/15. WBCs normalized today.  -Surgery  Notes cholangiogram with stenosis of the distal CBD and note that pt will likely need further eval with ERCP. No plans for lap chole this admission.      For questions or updates, please contact [place] Please consult [email or web address] for contact info under        Signed, Daune Perch, NP  08/19/2018, 9:21 AM    I have seen and examined the patient along with Daune Perch, NP .  I have reviewed the chart, notes and new data.  I agree with PA/NP's note.  Key new complaints: denies dyspnea, but notes some swelling in her knees and her left arm Key examination changes: weight up 3 lb since yesterday and reportedly up 11 lb since 03/15. JVP 4-5 cm; irregular tachycardia, clear lungs. Key new findings / data: remains in AFib w RVR  PLAN: DC flecainide. Start IV amiodarone. BP better, allows a slight increase in metoprolol dose as well. Stop IV fluids. Does appear mildly hypervolemic, but does not need diuretics yet.  Sanda Klein, MD, [place] [phone] 08/19/2018, 10:38 AM

## 2018-08-19 NOTE — Progress Notes (Signed)
Referring Physician(s): Arta Silence  Supervising Physician: Jacqulynn Cadet  Patient Status:  [facility] - In-pt  Chief Complaint: Tenderness at tube site  Subjective:  Occlusive choledocholithiasis s/p cholecystostomy tube placement 08/16/2018 by Dr. Pascal Lux. Patient awake and alert laying in bed. Accompanied by grandson at bedside. Complains of tenderness at tube site. Cholecystostomy tube c/d/i.   Allergies: Metronidazole and Penicillin g  Medications: Prior to Admission medications   Medication Sig Start Date End Date Taking? Authorizing Provider  carvedilol (COREG) 3.125 MG tablet Take 1 tablet (3.125 mg total) by mouth 2 (two) times daily with a meal. 08/04/18  Yes Gollan, Kathlene November, MD  flecainide (TAMBOCOR) 50 MG tablet Take 1 tablet (50 mg total) by mouth 2 (two) times daily. 08/04/18  Yes Gollan, Kathlene November, MD  magnesium chloride (SLOW-MAG) 64 MG TBEC SR tablet Take 2 tablets (128 mg total) by mouth daily. 12/18/17  Yes [name], MD  omeprazole (PRILOSEC) 40 MG capsule Take 1 capsule (40 mg total) by mouth daily. 05/13/18 08/17/18 Yes [name], MD  rosuvastatin (CRESTOR) 20 MG tablet Take 1 tablet (20 mg total) by mouth daily. 08/04/18  Yes Minna Merritts, MD  triamcinolone ointment (KENALOG) 0.1 % Apply 1 application topically 2 (two) times daily as needed (rash).  07/29/18  Yes [provider]     Vital Signs: BP 110/76 (BP Location: Right Arm)    Pulse (!) 121    Temp 98.5 F (36.9 C)    Resp (!) 23    Ht 4' 9.01" (1.448 m)    Wt 123 lb 4.8 oz (55.9 kg)    SpO2 95%    BMI 26.67 kg/m   Physical Exam Vitals signs and nursing note reviewed.  Constitutional:      General: She is not in acute distress.    Appearance: Normal appearance.  Pulmonary:     Effort: Pulmonary effort is normal. No respiratory distress.  Abdominal:     Comments: Cholecystostomy tube site with mild tenderness, no erythema, drainage, or active bleeding; approximately  25 mL yellow bile in gravity bag; drain flushes without resistance.  Skin:    General: Skin is warm and dry.  Neurological:     Mental Status: She is alert and oriented to person, place, and time.  Psychiatric:        Mood and Affect: Mood normal.        Behavior: Behavior normal.        Thought Content: Thought content normal.        Judgment: Judgment normal.     Imaging: Ir Perc Cholecystostomy  Result Date: 08/16/2018 INDICATION: Concern for occlusive choledocholithiasis, now with hemodynamic instability and poor candidate for endoscopy. After stabilization in the ICU, request made for placement of a percutaneous biliary drainage catheter for infection source control purposes. EXAM: ULTRASOUND AND FLUOROSCOPIC-GUIDED CHOLECYSTOSTOMY TUBE PLACEMENT COMPARISON:  MRCP - 08/15/2018; 07/06/2018; CT abdomen and pelvis - 08/14/2018 MEDICATIONS: The patient is currently admitted to the hospital and on intravenous antibiotics. Antibiotics were administered within an appropriate time frame prior to skin puncture. ANESTHESIA/SEDATION: Moderate (conscious) sedation was employed during this procedure. A total of Versed 1.5 mg and Fentanyl 50 mcg was administered intravenously. Moderate Sedation Time: 63 minutes. The patient's level of consciousness and vital signs were monitored continuously by radiology nursing throughout the procedure under my direct supervision. CONTRAST:  49mL OMNIPAQUE IOHEXOL 300 MG/ML SOLN - administered into the biliary tree, hepatic parenchyma and gallbladder fossa. FLUOROSCOPY TIME:  13 minutes, 48 seconds (55 mGy) COMPLICATIONS: None immediate. PROCEDURE: Informed written consent was obtained from the patient and the patient's family after a discussion of the risks, benefits and alternatives to treatment. Questions regarding the procedure were encouraged and answered. A timeout was performed prior to the initiation of the procedure. The right upper abdominal quadrant was prepped  and draped in the usual sterile fashion, and a sterile drape was applied covering the operative field. Maximum barrier sterile technique with sterile gowns and gloves were used for the procedure. A timeout was performed prior to the initiation of the procedure. Local anesthesia was provided with 1% lidocaine with epinephrine. Ultrasound evaluation of the abdomen was negative for any significant dilatation of the peripheral aspect of the biliary tree. Note is made of marked gallbladder wall thickening as well as the presence of known cholelithiasis and gallbladder sludge. Multiple attempts were made to cannulate the biliary tree both from right and left-sided approaches. At one point, there is minimal opacification of the central aspect of the right biliary tree with eventual opacification of the common bile and cystic ducts. Given patient's hemodynamic instability as well as lack of any significant intrahepatic biliary ductal dilatation and patency of the cystic duct the decision was made to proceed with cholecystostomy tube placement. As such, utilizing a transhepatic approach, a 22 gauge needle was advanced into the gallbladder under direct ultrasound guidance. An ultrasound image was saved for documentation purposes. Appropriate intraluminal puncture was confirmed with the efflux of bile and advancement of an 0.018 wire into the gallbladder lumen. The needle was exchanged for an [place] set. A small amount of contrast was injected to confirm appropriate intraluminal positioning. Over a short Amplatz wire, a 10.2-French Cook cholecystomy tube was advanced into the gallbladder fossa, coiled and locked. Bile was aspirated and a small amount of contrast was injected as several post procedural spot radiographic images were obtained in various obliquities. The catheter was secured to the skin with suture, connected to a drainage bag and a dressing was placed. The patient tolerated the procedure well without immediate  post procedural complication. FINDINGS: Sonographic evaluation of the abdomen is negative for any significant intrahepatic biliary duct dilatation. Note is made of gallbladder wall thickening pericholecystic fluid as well as multiple radiopaque gallstones or biliary sludge. Limited percutaneous cholangiogram demonstrates opacification of a moderately dilated common bile duct as well as opacification of the cystic duct. There is no definitive passage of contrast to the distal aspect of the CBD. Given lack of any significant intrahepatic biliary ductal dilatation, as well as patency of the cystic duct and abnormal appearance of the gallbladder, the decision was made to proceed with cholecystostomy tube placement. After image guided placement, the 10 French percutaneous cholecystostomy tube is appropriately positioned with end coiled and locked within the lumen of the gallbladder. IMPRESSION: 1. Attempted though ultimately unsuccessful placement of an internal/external biliary drainage catheter secondary to lack of any significant dilatation of the intrahepatic biliary system. 2. Given abnormal sonographic appearance of the gallbladder as well as limited percutaneous cholangiogram demonstrating patency of the cystic duct, the decision was made to proceed with image guided cholecystostomy tube placement for infection source control purposes. 3. Successful ultrasound and fluoroscopic guided placement of a 10.2 French cholecystostomy tube. PLAN: - Continued resuscitative measures by the ICU staff. - Maintain chole tube to gravity bag. - Obtain daily CMP to evaluate bilirubin trend. - Diagnostic percutaneous cholangiogram could be performed via the existing cholecystostomy tube following the resolution of  acute symptoms. Above findings discussed with Dr. Nelda Marseille at the time of procedure completion. Electronically Signed   By: Sandi Mariscal M.D.   On: 08/16/2018 19:51   Ir Cholangiogram Existing Tube  Result Date:  08/18/2018 INDICATION: Biliary ductal dilatation status post decompressive cholecystostomy catheter placement. EXAM: CHOLANGIOGRAM THROUGH EXISTING CATHETER MEDICATIONS: None indicated ANESTHESIA/SEDATION: None required FLUOROSCOPY TIME:  42 seconds; 5 mGy COMPLICATIONS: None immediate. PROCEDURE: Informed written consent was obtained from the patient and family after a thorough discussion of the procedural risks, benefits and alternatives. All questions were addressed. Maximal Sterile Barrier Technique was utilized including caps, mask, sterile gowns, sterile gloves, sterile drape, hand hygiene and skin antiseptic. A timeout was performed prior to the initiation of the procedure. Scout radiograph shows stable position of the cholecystostomy catheter. Contrast injection shows normal filling of the gallbladder. Cystic duct is patent. No filling defects in the CBD. There is a short segment eccentric shelf-like area of high-grade stenosis in the distal CBD just proximal to the ampulla. A small amount of contrast traverses this area into the duodenum. Partial opacification of the pancreatic duct in the head of the pancreas, which is nondilated. IMPRESSION: 1. 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 Consider endoscopic evaluation and brush biopsy to exclude neoplasm. Electronically Signed   By: Lucrezia Europe M.D.   On: 08/18/2018 16:49    Labs:  CBC: Recent Labs    08/15/18 2040 08/16/18 0938 08/17/18 0324 08/19/18 0310  WBC 15.0* 14.8* 13.7* 8.8  HGB 9.6* 10.0* 10.3* 11.3*  HCT 29.9* 31.2* 33.2* 33.1*  PLT 203 204 217 288    COAGS: Recent Labs    07/05/18 1655 07/07/18 0533 08/15/18 2040  INR 1.09  --  1.3*  APTT  --  113*  --     BMP: Recent Labs    08/16/18 0428 08/16/18 2038 08/17/18 0324 08/19/18 0310  NA 135 135 137 135  K 3.8 3.8 4.1 3.8  CL 106 109 114* 109  CO2 19* 20* 15* 20*  GLUCOSE 88 77 75 99  BUN 16 24* 26* 16  CALCIUM 8.0* 7.6* 7.6* 8.6*    CREATININE 1.17* 1.23* 1.20* 0.97  GFRNONAA 40* 38* 39* 50*  GFRAA 46* 43* 45* 58*    LIVER FUNCTION TESTS: Recent Labs    08/15/18 2040 08/16/18 0938 08/17/18 0324 08/19/18 0310  BILITOT 3.6* 2.6* 1.6* 1.2  AST 180* 106* 68* 22  ALT 153* 117* 84* 46*  ALKPHOS 136* 128* 114 93  PROT 5.6* 5.2* 5.2* 5.2*  ALBUMIN 2.7* 2.5* 2.2* 2.2*    Assessment and Plan:  Occlusive choledocholithiasis s/p cholecystostomy tube placement 08/16/2018 by Dr. Pascal Lux. Cholecystostomy tube stable with approximately 25 mL of yellow bile in gravity bag. Continue with Qshift flushes and monitoring of output. Appreciate and agree with CCS and TRH management. IR to follow.   Electronically Signed: Earley Abide, PA-C 08/19/2018, 11:19 AM   I spent a total of 25 Minutes at the the patient's bedside AND on the patient's hospital floor or unit, greater than 50% of which was counseling/coordinating care for occlusive choledocholithiasis s/p cholecystostomy tube placement.

## 2018-08-20 LAB — CULTURE, BLOOD (ROUTINE X 2)
Culture: NO GROWTH
Culture: NO GROWTH

## 2018-08-20 MED ORDER — PANTOPRAZOLE SODIUM 40 MG IV SOLR
40.0000 mg | INTRAVENOUS | Status: DC
  Administered 2018-08-20: 40 mg via INTRAVENOUS
  Filled 2018-08-20: qty 40

## 2018-08-20 NOTE — Care Management Important Message (Signed)
Important Message  Patient Details  Name: Kylie May MRN: [id number] Date of Birth: 1922/02/12   Medicare Important Message Given:  Yes    Rayner Erman 08/20/2018, 1:48 PM

## 2018-08-20 NOTE — Progress Notes (Signed)
Subjective: The patient was seen and examined at bedside in presence of her daughter and grandson. She complains of upper abdominal discomfort, lack of appetite. She denies pain around the cholecystostomy site.  Objective: Vital signs in last 24 hours: Temp:  [97.6 F (36.4 C)-98.3 F (36.8 C)] 97.6 F (36.4 C) (03/18 2101) Pulse Rate:  [94-121] 94 (03/19 0825) Resp:  [11-23] 11 (03/18 2101) BP: (104-131)/(79-98) 120/98 (03/19 0825) SpO2:  [96 %-97 %] 96 % (03/18 2101) Weight:  [56.6 kg] 56.6 kg (03/19 0659) Weight change: 0.68 kg Last BM Date: 08/20/18  PE: Elderly, frail, very hard of hearing GENERAL: No icterus, mild pallor ABDOMEN: Soft, cholecystostomy drain with minimal amount of bilious fluid, normoactive bowel sounds EXTREMITIES: No deformity  Lab Results: Results for orders placed or performed during the hospital encounter of 08/15/18 (from the past 48 hour(s))  Magnesium     Status: None   Collection Time: 08/19/18  3:10 AM  Result Value Ref Range   Magnesium 1.7 1.7 - 2.4 mg/dL    Comment: Performed at [facility], [address], [place], [place] [address]  Comprehensive metabolic panel     Status: Abnormal   Collection Time: 08/19/18  3:10 AM  Result Value Ref Range   Sodium 135 135 - 145 mmol/L   Potassium 3.8 3.5 - 5.1 mmol/L   Chloride 109 98 - 111 mmol/L   CO2 20 (L) 22 - 32 mmol/L   Glucose, Bld 99 70 - 99 mg/dL   BUN 16 8 - 23 mg/dL   Creatinine, Ser 0.97 0.44 - 1.00 mg/dL   Calcium 8.6 (L) 8.9 - 10.3 mg/dL   Total Protein 5.2 (L) 6.5 - 8.1 g/dL   Albumin 2.2 (L) 3.5 - 5.0 g/dL   AST 22 15 - 41 U/L   ALT 46 (H) 0 - 44 U/L   Alkaline Phosphatase 93 38 - 126 U/L   Total Bilirubin 1.2 0.3 - 1.2 mg/dL   GFR calc non Af Amer 50 (L) >60 mL/min   GFR calc Af Amer 58 (L) >60 mL/min   Anion gap 6 5 - 15    Comment: Performed at [facility], [place] [address], [place], [place] [address]  CBC     Status: Abnormal   Collection Time: 08/19/18   3:10 AM  Result Value Ref Range   WBC 8.8 4.0 - 10.5 K/uL   RBC 3.90 3.87 - 5.11 MIL/uL   Hemoglobin 11.3 (L) 12.0 - 15.0 g/dL   HCT 33.1 (L) 36.0 - 46.0 %   MCV 84.9 80.0 - 100.0 fL    Comment: REPEATED TO VERIFY   MCH 29.0 26.0 - 34.0 pg   MCHC 34.1 30.0 - 36.0 g/dL   RDW 13.3 11.5 - 15.5 %   Platelets 288 150 - 400 K/uL   nRBC 0.0 0.0 - 0.2 %    Comment: Performed at [facility], [place] [address] [place],  [address]    Studies/Results: Ir Cholangiogram Existing Tube  Result Date: 08/18/2018 INDICATION: Biliary ductal dilatation status post decompressive cholecystostomy catheter placement. EXAM: CHOLANGIOGRAM THROUGH EXISTING CATHETER MEDICATIONS: None indicated ANESTHESIA/SEDATION: None required FLUOROSCOPY TIME:  42 seconds; 5 mGy COMPLICATIONS: None immediate. PROCEDURE: Informed written consent was obtained from the patient and family after a thorough discussion of the procedural risks, benefits and alternatives. All questions were addressed. Maximal Sterile Barrier Technique was utilized including caps, mask, sterile gowns, sterile gloves, sterile drape, hand hygiene and skin  antiseptic. A timeout was performed prior to the initiation of the procedure. Scout radiograph shows stable position of the cholecystostomy catheter. Contrast injection shows normal filling of the gallbladder. Cystic duct is patent. No filling defects in the CBD. There is a short segment eccentric shelf-like area of high-grade stenosis in the distal CBD just proximal to the ampulla. A small amount of contrast traverses this area into the duodenum. Partial opacification of the pancreatic duct in the head of the pancreas, which is nondilated. IMPRESSION: 1. 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 Consider endoscopic evaluation and brush biopsy to exclude neoplasm. Electronically Signed   By: Lucrezia Europe M.D.   On: 08/18/2018 16:49    Medications: I have reviewed the patient's  current medications.  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 post midnight Continue IV Zosyn Plan ERCP in a.m.. The risks and the benefits of the procedure have been discussed in detail with the patient and family members. They verbalized understanding and consent.  [name] 08/20/2018, 11:34 AM   Pager [phone] If no answer or after 5 PM call [phone]

## 2018-08-20 NOTE — Progress Notes (Signed)
[facility]  PROGRESS NOTE  Kylie May PZW:[id number] DOB: 1921-10-21 DOA: 08/15/2018 PCP: [name], MD   Brief HPI:   85 year old female with a history of atrial fibrillation who came with CBD stone resulting in septic shock.  GI did not feel comfortable taking her to endoscopy due to cardiopulmonary instability.  General surgery also felt that she was unstable for surgery.  PCCM was consulted for stabilization prior to consideration for IR drainage.  Patient underwent cholecystostomy tube placement and now planned to undergo ERCP once her atrial fibrillation is under control.    Subjective   Patient seen and examined, denies chest pain or shortness of breath.  Heart rate is down to around 100   Assessment/Plan:     1. Septic shock-secondary to acute cholecystitis with obstructing CBD stone, resolved, patient is on Zosyn.  Percutaneous IR drain placed on 08/16/2018.  And for ERCP per GI once heart rate is controlled.  Continue Zosyn per pharmacy.  2. Chronic atrial fibrillation-patient currently having acute RVR, Eliquis on hold for ERCP.  Amiodarone was stopped in outpatient setting due to elevated TSH.  Patient was started on IV amiodarone and dose of metoprolol increased to 25 mg every 8 hours, and heart rate has improved to 100s.  Cardiology to evaluate further and make recommendations.  3. Chronic diastolic CHF-no signs of volume overload.  4. Acute kidney injury-secondary to septic shock, improving with hydration.  Creatinine is 1.20.  5. Nausea/epigastric discomfort-start Protonix 40 mg IV daily, continue Zofran PRN for nausea.     CBG: Recent Labs  Lab 08/15/18 2203 08/16/18 0413 08/16/18 1610  GLUCAP 99 82 76    CBC: Recent Labs  Lab 08/14/18 2055 08/15/18 1413 08/15/18 2040 08/16/18 0938 08/17/18 0324 08/19/18 0310  WBC 17.9*  18.1* 16.8* 15.0* 14.8* 13.7* 8.8  NEUTROABS 15.6*  --  12.9* 13.0* 11.3*  --   HGB 11.0*  11.0* 9.8* 9.6*  10.0* 10.3* 11.3*  HCT 33.5*  33.6* 29.2* 29.9* 31.2* 33.2* 33.1*  MCV 88.9  88.9 86.6 87.9 86.7 91.2 84.9  PLT 292  284 216 203 204 217 423    Basic Metabolic Panel: Recent Labs  Lab 08/15/18 2040 08/16/18 0428 08/16/18 2038 08/17/18 0324 08/18/18 0342 08/19/18 0310  NA 133* 135 135 137  --  135  K 3.8 3.8 3.8 4.1  --  3.8  CL 103 106 109 114*  --  109  CO2 21* 19* 20* 15*  --  20*  GLUCOSE 99 88 77 75  --  99  BUN 17 16 24* 26*  --  16  CREATININE 1.36* 1.17* 1.23* 1.20*  --  0.97  CALCIUM 7.9* 8.0* 7.6* 7.6*  --  8.6*  MG 1.1* 2.0 1.9 1.9 2.0 1.7     DVT prophylaxis: SCDs  Code Status: Limited code  Family Communication: Discussed with family at bedside  Disposition Plan: likely home when medically ready for discharge     Consultants:  General surgery  Intervention radiology  Procedures:  Cholecystostomy tube placement   Antibiotics:   Anti-infectives (From admission, onward)   Start     Dose/Rate Route Frequency Ordered Stop   08/16/18 1740  vancomycin (VANCOCIN) 1-5 GM/200ML-% IVPB  Status:  Discontinued    Note to Pharmacy:  Desiree Hane   : cabinet override      08/16/18 1740 08/16/18 1824   08/15/18 2200  cefTRIAXone (ROCEPHIN) 2 g in sodium chloride 0.9 % 100 mL IVPB  Status:  Discontinued     2 g 200 mL/hr over 30 Minutes Intravenous Every 24 hours 08/15/18 1957 08/15/18 2005   08/15/18 2200  piperacillin-tazobactam (ZOSYN) IVPB 3.375 g     3.375 g 12.5 mL/hr over 240 Minutes Intravenous Every 8 hours 08/15/18 2036     08/15/18 2000  metroNIDAZOLE (FLAGYL) IVPB 500 mg  Status:  Discontinued     500 mg 100 mL/hr over 60 Minutes Intravenous Every 8 hours 08/15/18 1957 08/15/18 2005       Objective   Vitals:   08/20/18 0550 08/20/18 0659 08/20/18 0825 08/20/18 1240  BP: 131/85  (!) 120/98 (!) 119/97  Pulse: (!) 102  94 (!) 44  Resp:   15 16  Temp:    97.8 F (36.6 C)  TempSrc:    Axillary  SpO2:      Weight:  56.6 kg     Height:        Intake/Output Summary (Last 24 hours) at 08/20/2018 1327 Last data filed at 08/19/2018 2246 Gross per 24 hour  Intake 1121.25 ml  Output 2 ml  Net 1119.25 ml   Filed Weights   08/18/18 0437 08/19/18 0440 08/20/18 0659  Weight: 54.5 kg 55.9 kg 56.6 kg     Physical Examination:    General: Appears in no acute distress  Cardiovascular: S1-S2, regular  Respiratory: Clear to auscultation bilaterally  Abdomen: Abdomen is soft, mild tenderness at right upper quadrant region  Extremities: No edema of the lower extremities  Neurologic: Alert, oriented x3, no focal deficit noted      Data Reviewed: I have personally reviewed following labs and imaging studies   Recent Results (from the past 240 hour(s))  Culture, blood (routine x 2)     Status: None   Collection Time: 08/15/18  3:25 PM  Result Value Ref Range Status   Specimen Description BLOOD RIGHT [facility]  Final   Special Requests BOTTLES DRAWN AEROBIC AND ANAEROBIC  Final   Culture   Final    NO GROWTH 5 DAYS Performed at [facility], [address] [place], [place] [address]    Report Status 08/20/2018 FINAL  Final  Culture, blood (routine x 2)     Status: None   Collection Time: 08/15/18  3:25 PM  Result Value Ref Range Status   Specimen Description BLOOD RFOA  Final   Special Requests BOTTLES DRAWN AEROBIC AND ANAEROBIC  Final   Culture   Final    NO GROWTH 5 DAYS Performed at [facility], [address] [place], [place] [address]    Report Status 08/20/2018 FINAL  Final  Culture, blood (Routine X 2) w Reflex to ID Panel     Status: None (Preliminary result)   Collection Time: 08/16/18  3:38 PM  Result Value Ref Range Status   Specimen Description BLOOD RIGHT HAND  Final   Special Requests AEROBIC BOTTLE ONLY Blood Culture adequate volume  Final   Culture   Final    NO GROWTH 4 DAYS Performed at [facility], [place] [address], [place], [place] [address]    Report  Status PENDING  Incomplete  Culture, blood (Routine X 2) w Reflex to ID Panel     Status: None (Preliminary result)   Collection Time: 08/16/18  3:44 PM  Result Value Ref Range Status   Specimen Description BLOOD LEFT HAND  Final   Special Requests AEROBIC BOTTLE ONLY Blood Culture adequate volume  Final   Culture  Final    NO GROWTH 4 DAYS Performed at [facility], [place] [address], [place], [place] [address]    Report Status PENDING  Incomplete  MRSA PCR Screening     Status: None   Collection Time: 08/16/18  4:53 PM  Result Value Ref Range Status   MRSA by PCR NEGATIVE NEGATIVE Final    Comment:        The GeneXpert MRSA Assay (FDA approved for NASAL specimens only), is one component of a comprehensive MRSA colonization surveillance program. It is not intended to diagnose MRSA infection nor to guide or monitor treatment for MRSA infections. Performed at [facility], [place] [address], [place], [place] [address]   Urine Culture     Status: Abnormal   Collection Time: 08/16/18  8:37 PM  Result Value Ref Range Status   Specimen Description URINE, CLEAN CATCH  Final   Special Requests NONE  Final   Culture (A)  Final    <10,000 COLONIES/mL INSIGNIFICANT GROWTH Performed at [facility], [place] [address] [place], [place] [address]    Report Status 08/18/2018 FINAL  Final     Liver Function Tests: Recent Labs  Lab 08/15/18 1413 08/15/18 2040 08/16/18 0938 08/17/18 0324 08/19/18 0310  AST 242* 180* 106* 68* 22  ALT 182* 153* 117* 84* 46*  ALKPHOS 138* 136* 128* 114 93  BILITOT 3.8* 3.6* 2.6* 1.6* 1.2  PROT 6.0* 5.6* 5.2* 5.2* 5.2*  ALBUMIN 2.9* 2.7* 2.5* 2.2* 2.2*   Recent Labs  Lab 08/14/18 2055  LIPASE 107*   No results for input(s): AMMONIA in the last 168 hours.  Cardiac Enzymes: Recent Labs  Lab 08/14/18 2055  TROPONINI <0.03   BNP (last 3 results) Recent Labs    08/15/18 2040  BNP 1,005.7*    ProBNP (last 3 results) No  results for input(s): PROBNP in the last 8760 hours.    Studies: No results found.  Scheduled Meds: . mouth rinse  15 mL Mouth Rinse BID  . metoprolol tartrate  25 mg Oral Q8H  . pantoprazole (PROTONIX) IV  40 mg Intravenous Q24H  . sodium chloride flush  5 mL Intracatheter Q8H    Admission status: Inpatient: Based on patients clinical presentation and evaluation of above clinical data, I have made determination that patient meets Inpatient criteria at this time.  Time spent: 30 min  [facility] Pager [phone]. If 7PM-7AM, please contact night-coverage at [email or web address], Office  [phone]  password [email or web address]  08/20/2018, 1:27 PM  LOS: 5 days

## 2018-08-20 NOTE — H&P (View-Only) (Signed)
Subjective: The patient was seen and examined at bedside in presence of her daughter and grandson. She complains of upper abdominal discomfort, lack of appetite. She denies pain around the cholecystostomy site.  Objective: Vital signs in last 24 hours: Temp:  [97.6 F (36.4 C)-98.3 F (36.8 C)] 97.6 F (36.4 C) (03/18 2101) Pulse Rate:  [94-121] 94 (03/19 0825) Resp:  [11-23] 11 (03/18 2101) BP: (104-131)/(79-98) 120/98 (03/19 0825) SpO2:  [96 %-97 %] 96 % (03/18 2101) Weight:  [56.6 kg] 56.6 kg (03/19 0659) Weight change: 0.68 kg Last BM Date: 08/20/18  PE: Elderly, frail, very hard of hearing GENERAL: No icterus, mild pallor ABDOMEN: Soft, cholecystostomy drain with minimal amount of bilious fluid, normoactive bowel sounds EXTREMITIES: No deformity  Lab Results: Results for orders placed or performed during the hospital encounter of 08/15/18 (from the past 48 hour(s))  Magnesium     Status: None   Collection Time: 08/19/18  3:10 AM  Result Value Ref Range   Magnesium 1.7 1.7 - 2.4 mg/dL    Comment: Performed at [facility], [address] [place], [place] [address]  Comprehensive metabolic panel     Status: Abnormal   Collection Time: 08/19/18  3:10 AM  Result Value Ref Range   Sodium 135 135 - 145 mmol/L   Potassium 3.8 3.5 - 5.1 mmol/L   Chloride 109 98 - 111 mmol/L   CO2 20 (L) 22 - 32 mmol/L   Glucose, Bld 99 70 - 99 mg/dL   BUN 16 8 - 23 mg/dL   Creatinine, Ser 0.97 0.44 - 1.00 mg/dL   Calcium 8.6 (L) 8.9 - 10.3 mg/dL   Total Protein 5.2 (L) 6.5 - 8.1 g/dL   Albumin 2.2 (L) 3.5 - 5.0 g/dL   AST 22 15 - 41 U/L   ALT 46 (H) 0 - 44 U/L   Alkaline Phosphatase 93 38 - 126 U/L   Total Bilirubin 1.2 0.3 - 1.2 mg/dL   GFR calc non Af Amer 50 (L) >60 mL/min   GFR calc Af Amer 58 (L) >60 mL/min   Anion gap 6 5 - 15    Comment: Performed at [facility], [place] [address], [place], [place] [address]  CBC     Status: Abnormal   Collection Time: 08/19/18   3:10 AM  Result Value Ref Range   WBC 8.8 4.0 - 10.5 K/uL   RBC 3.90 3.87 - 5.11 MIL/uL   Hemoglobin 11.3 (L) 12.0 - 15.0 g/dL   HCT 33.1 (L) 36.0 - 46.0 %   MCV 84.9 80.0 - 100.0 fL    Comment: REPEATED TO VERIFY   MCH 29.0 26.0 - 34.0 pg   MCHC 34.1 30.0 - 36.0 g/dL   RDW 13.3 11.5 - 15.5 %   Platelets 288 150 - 400 K/uL   nRBC 0.0 0.0 - 0.2 %    Comment: Performed at [facility], [place] [address], [place], [place] [address]    Studies/Results: Ir Cholangiogram Existing Tube  Result Date: 08/18/2018 INDICATION: Biliary ductal dilatation status post decompressive cholecystostomy catheter placement. EXAM: CHOLANGIOGRAM THROUGH EXISTING CATHETER MEDICATIONS: None indicated ANESTHESIA/SEDATION: None required FLUOROSCOPY TIME:  42 seconds; 5 mGy COMPLICATIONS: None immediate. PROCEDURE: Informed written consent was obtained from the patient and family after a thorough discussion of the procedural risks, benefits and alternatives. All questions were addressed. Maximal Sterile Barrier Technique was utilized including caps, mask, sterile gowns, sterile gloves, sterile drape, hand hygiene and skin  antiseptic. A timeout was performed prior to the initiation of the procedure. Scout radiograph shows stable position of the cholecystostomy catheter. Contrast injection shows normal filling of the gallbladder. Cystic duct is patent. No filling defects in the CBD. There is a short segment eccentric shelf-like area of high-grade stenosis in the distal CBD just proximal to the ampulla. A small amount of contrast traverses this area into the duodenum. Partial opacification of the pancreatic duct in the head of the pancreas, which is nondilated. IMPRESSION: 1. 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 Consider endoscopic evaluation and brush biopsy to exclude neoplasm. Electronically Signed   By: Lucrezia Europe M.D.   On: 08/18/2018 16:49    Medications: I have reviewed the patient's  current medications.  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 post midnight Continue IV Zosyn Plan ERCP in a.m.. The risks and the benefits of the procedure have been discussed in detail with the patient and family members. They verbalized understanding and consent.  [name] 08/20/2018, 11:34 AM   Pager [phone] If no answer or after 5 PM call [phone]

## 2018-08-20 NOTE — Progress Notes (Addendum)
Progress Note  Patient Name: Kylie May Date of Encounter: 08/20/2018  Primary Cardiologist: Ida Rogue, MD   Subjective   Pt with no awareness of afib. No complaints.   Inpatient Medications    Scheduled Meds:  mouth rinse  15 mL Mouth Rinse BID   metoprolol tartrate  25 mg Oral Q8H   pantoprazole (PROTONIX) IV  40 mg Intravenous Q24H   sodium chloride flush  5 mL Intracatheter Q8H   Continuous Infusions:  sodium chloride 10 mL/hr at 08/19/18 1100   amiodarone 30 mg/hr (08/19/18 2245)   piperacillin-tazobactam (ZOSYN)  IV 3.375 g (08/20/18 0550)   PRN Meds: acetaminophen, diphenhydrAMINE, ondansetron (ZOFRAN) IV, oxyCODONE, white petrolatum   Vital Signs    Vitals:   08/19/18 2222 08/20/18 0550 08/20/18 0659 08/20/18 0825  BP: (!) 121/92 131/85  (!) 120/98  Pulse: (!) 101 (!) 102  94  Resp:      Temp:      TempSrc:      SpO2:      Weight:   56.6 kg   Height:        Intake/Output Summary (Last 24 hours) at 08/20/2018 1014 Last data filed at 08/19/2018 2246 Gross per 24 hour  Intake 1121.25 ml  Output 2 ml  Net 1119.25 ml   Last 3 Weights 08/20/2018 08/19/2018 08/18/2018  Weight (lbs) 124 lb 12.8 oz 123 lb 4.8 oz 120 lb 2.4 oz  Weight (kg) 56.609 kg 55.929 kg 54.5 kg      Telemetry    afib with rates around 100 bpm- Personally Reviewed  ECG    No new tracings - Personally Reviewed  Physical Exam   GEN: Frail elderly female. No acute distress.   Neck: No JVD Cardiac: Irregularly irregular rhythm, no murmurs, rubs, or gallops.  Respiratory: Clear to auscultation bilaterally. GI: Soft, nontender, non-distended  MS: No edema; No deformity. Neuro:  Nonfocal  Psych: Normal affect   Labs    Chemistry Recent Labs  Lab 08/16/18 0938 08/16/18 2038 08/17/18 0324 08/19/18 0310  NA  --  135 137 135  K  --  3.8 4.1 3.8  CL  --  109 114* 109  CO2  --  20* 15* 20*  GLUCOSE  --  77 75 99  BUN  --  24* 26* 16  CREATININE  --  1.23*  1.20* 0.97  CALCIUM  --  7.6* 7.6* 8.6*  PROT 5.2*  --  5.2* 5.2*  ALBUMIN 2.5*  --  2.2* 2.2*  AST 106*  --  68* 22  ALT 117*  --  84* 46*  ALKPHOS 128*  --  114 93  BILITOT 2.6*  --  1.6* 1.2  GFRNONAA  --  38* 39* 50*  GFRAA  --  43* 45* 58*  ANIONGAP  --  6 8 6      Hematology Recent Labs  Lab 08/16/18 0938 08/17/18 0324 08/19/18 0310  WBC 14.8* 13.7* 8.8  RBC 3.60* 3.64* 3.90  HGB 10.0* 10.3* 11.3*  HCT 31.2* 33.2* 33.1*  MCV 86.7 91.2 84.9  MCH 27.8 28.3 29.0  MCHC 32.1 31.0 34.1  RDW 13.2 13.4 13.3  PLT 204 217 288    Cardiac Enzymes Recent Labs  Lab 08/14/18 2055  TROPONINI <0.03   No results for input(s): TROPIPOC in the last 168 hours.   BNP Recent Labs  Lab 08/15/18 2040  BNP 1,005.7*     DDimer No results for input(s): DDIMER in the last  168 hours.   Radiology    Ir Cholangiogram Existing Tube  Result Date: 08/18/2018 INDICATION: Biliary ductal dilatation status post decompressive cholecystostomy catheter placement. EXAM: CHOLANGIOGRAM THROUGH EXISTING CATHETER MEDICATIONS: None indicated ANESTHESIA/SEDATION: None required FLUOROSCOPY TIME:  42 seconds; 5 mGy COMPLICATIONS: None immediate. PROCEDURE: Informed written consent was obtained from the patient and family after a thorough discussion of the procedural risks, benefits and alternatives. All questions were addressed. Maximal Sterile Barrier Technique was utilized including caps, mask, sterile gowns, sterile gloves, sterile drape, hand hygiene and skin antiseptic. A timeout was performed prior to the initiation of the procedure. Scout radiograph shows stable position of the cholecystostomy catheter. Contrast injection shows normal filling of the gallbladder. Cystic duct is patent. No filling defects in the CBD. There is a short segment eccentric shelf-like area of high-grade stenosis in the distal CBD just proximal to the ampulla. A small amount of contrast traverses this area into the duodenum.  Partial opacification of the pancreatic duct in the head of the pancreas, which is nondilated. IMPRESSION: 1. 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 Consider endoscopic evaluation and brush biopsy to exclude neoplasm. Electronically Signed   By: Lucrezia Europe M.D.   On: 08/18/2018 16:49    Cardiac Studies   Echocardiogram 08/17/2018 IMPRESSIONS 1. The left ventricle has normal systolic function, with an ejection fraction of 60-65%. The cavity size was normal. Left ventricular diastolic function could not be evaluated secondary to atrial fibrillation. No evidence of left ventricular regional  wall motion abnormalities. 2. The right ventricle has normal systolc function. The cavity was normal. There is no increase in right ventricular wall thickness. Right ventricular systolic pressure is mildly elevated with an estimated pressure of 43.5 mmHg. 3. Left atrial size was moderately dilated. 4. Right atrial size was moderately dilated. 5. Small pericardial effusion. 6. The pericardial effusion is circumferential. 7. The aortic valve is tricuspid Mild thickening of the aortic valve Mild calcification of the aortic valve. 8. The inferior vena cava was dilated in size with <50% respiratory variability.  FINDINGS Left Ventricle: The left ventricle has normal systolic function, with an ejection fraction of 60-65%. The cavity size was normal. There is no increase in left ventricular wall thickness. Left ventricular diastolic function could not be evaluated  secondary to atrial fibrillation. No evidence of left ventricular regional wall motion abnormalities. Right Ventricle: The right ventricle has normal systolic function. The cavity was normal. There is no increase in right ventricular wall thickness. Right ventricular systolic pressure is mildly elevated with an estimated pressure of 43.5 mmHg. Left Atrium: Left atrial size was moderately dilated. Right Atrium: Right atrial  size was moderately dilated. Right atrial pressure is estimated at 15 mmHg. Interatrial Septum: No atrial level shunt detected by color flow Doppler. Pericardium: A small pericardial effusion is present. The pericardial effusion is circumferential. Mitral Valve: The mitral valve is normal in structure. Mitral valve regurgitation is mild by color flow Doppler. Tricuspid Valve: The tricuspid valve was normal in structure. Tricuspid valve regurgitation is mild by color flow Doppler. Aortic Valve: The aortic valve is tricuspid Mild thickening of the aortic valve Mild calcification of the aortic valve. Aortic valve regurgitation was not visualized by color flow Doppler. There is no evidence of aortic valve stenosis. Pulmonic Valve: The pulmonic valve was grossly normal. Pulmonic valve regurgitation was not assessed by color flow Doppler. Pulmonary Artery: The pulmonary artery is not well seen. Venous: The inferior vena cava is dilated in size with less  than 50% respiratory variability.  Patient Profile     85 y.o. female with a hx of paroxysmal asymptomaticatrial fibrillationon Eliquis,mild diastolic CHF,HTN, hiatal hernia, osteopenia, degenerative joint disease, scoliosis, hearing lossadmitted with acute cholecystitis/ choledocholithiasis, complicated by persistentatrial fibrillationwith RVR, difficult to rate control due to low BP.  Assessment & Plan    Atrial fibrillation  -Persistent. Switched from flecainide to IV amiodarone yesterday. Metoprolol dose increased. Rates down from 120's to around 100. BP stable.  -Not currently on anticoagulation due to interventions for cholecystitis.    Valvular heart disease -Previous report of severe MR/TR. Echo done on 08/17/18 showed only mild MR/TR.   Biliary obstruction/infection with CBD stone -improving clinically on IV antibiotics. Percutaneous drain placed on 3/15. WBCs normalized today.  -Surgery  Notes cholangiogram with stenosis of the  distal CBD and note that pt will likely need further eval with ERCP. No plans for lap chole this admission.  -OK per cardiology for pt have ERCP tomorrow.      For questions or updates, please contact [facility] [place] Please consult [email or web address] for contact info under        Signed, Daune Perch, NP  08/20/2018, 10:14 AM    I have seen and examined the patient along with Daune Perch, NP .  I have reviewed the chart, notes and new data.  I agree with PA/NP's note.  Key new complaints: denies dyspnea. Sat in chair for several hours, but too weak to stand Key examination changes: Improved ventricular rate, average low 100s. No further hypotension. Mild signs of volume overload (JVP 5 cm, mild extremity swelling) Key new findings / data: WBC now normal and LFTs virtually normal  PLAN: D/W Dr. Therisa Doyne. Plan ERCP tomorrow and I think she will be ready. Barring any complications with that, may start gentle diuresis afterwards and transition the amiodarone to PO once good rate control is achieved.  Sanda Klein, MD, [place] [phone] 08/20/2018, 11:04 AM

## 2018-08-20 NOTE — Progress Notes (Signed)
Patient vomited prior to transfer to 6E22. Complaining of nausea and not feeling right after medicated yesterday. Patient given IV Zofran and a cup of coffee. Stated feeling much better. Able to tolerate crackers and then supper. Given medications now, as he was able to tolerate po. No further complaints remainder of shift. Sitting up in lounge chair when transferred and remained in chair until 2030.

## 2018-08-21 ENCOUNTER — Inpatient Hospital Stay (HOSPITAL_COMMUNITY): Payer: Medicare Other | Admitting: Anesthesiology

## 2018-08-21 ENCOUNTER — Encounter (HOSPITAL_COMMUNITY)
Admission: AD | Disposition: A | Discharge status: Home Health | Payer: Self-pay | Source: Other Acute Inpatient Hospital | Attending: Family Medicine

## 2018-08-21 ENCOUNTER — Encounter (HOSPITAL_COMMUNITY): Payer: Self-pay | Admitting: Anesthesiology

## 2018-08-21 ENCOUNTER — Inpatient Hospital Stay (HOSPITAL_COMMUNITY): Payer: Medicare Other

## 2018-08-21 HISTORY — PX: ERCP: SHX5425

## 2018-08-21 HISTORY — PX: BILIARY STENT PLACEMENT: SHX5538

## 2018-08-21 HISTORY — PX: REMOVAL OF STONES: SHX5545

## 2018-08-21 HISTORY — PX: SPHINCTEROTOMY: SHX5544

## 2018-08-21 LAB — CULTURE, BLOOD (ROUTINE X 2)
Culture: NO GROWTH
Culture: NO GROWTH
Special Requests: ADEQUATE
Special Requests: ADEQUATE

## 2018-08-21 SURGERY — ERCP, WITH INTERVENTION IF INDICATED
Anesthesia: General

## 2018-08-21 MED ORDER — METOPROLOL TARTRATE 5 MG/5ML IV SOLN
INTRAVENOUS | Status: AC
  Filled 2018-08-21: qty 5

## 2018-08-21 MED ORDER — SODIUM CHLORIDE 0.9 % IV SOLN
INTRAVENOUS | Status: DC | PRN
  Administered 2018-08-21: 50 ug/min via INTRAVENOUS

## 2018-08-21 MED ORDER — FUROSEMIDE 10 MG/ML IJ SOLN
20.0000 mg | Freq: Once | INTRAMUSCULAR | Status: AC
  Administered 2018-08-21: 20 mg via INTRAVENOUS
  Filled 2018-08-21: qty 2

## 2018-08-21 MED ORDER — METOPROLOL TARTRATE 5 MG/5ML IV SOLN
INTRAVENOUS | Status: DC | PRN
  Administered 2018-08-21 (×3): 1 mg via INTRAVENOUS

## 2018-08-21 MED ORDER — METOPROLOL TARTRATE 25 MG PO TABS: 37.5000 mg | ORAL_TABLET | Freq: Three times a day (TID) | ORAL | Status: DC

## 2018-08-21 MED ORDER — LIDOCAINE 2% (20 MG/ML) 5 ML SYRINGE
INTRAMUSCULAR | Status: DC | PRN
  Administered 2018-08-21: 50 mg via INTRAVENOUS

## 2018-08-21 MED ORDER — GLUCAGON HCL RDNA (DIAGNOSTIC) 1 MG IJ SOLR
INTRAMUSCULAR | Status: AC
  Filled 2018-08-21: qty 1

## 2018-08-21 MED ORDER — MAGNESIUM CHLORIDE 64 MG PO TBEC
2.0000 | DELAYED_RELEASE_TABLET | Freq: Every day | ORAL | Status: DC
  Administered 2018-08-22 – 2018-08-26 (×5): 128 mg via ORAL
  Filled 2018-08-21 (×5): qty 2

## 2018-08-21 MED ORDER — SODIUM CHLORIDE 0.9 % IV SOLN
INTRAVENOUS | Status: DC | PRN
  Administered 2018-08-21: 20 mL

## 2018-08-21 MED ORDER — ROSUVASTATIN CALCIUM 20 MG PO TABS
20.0000 mg | ORAL_TABLET | Freq: Every day | ORAL | Status: DC
  Administered 2018-08-21 – 2018-08-26 (×6): 20 mg via ORAL
  Filled 2018-08-21 (×6): qty 1

## 2018-08-21 MED ORDER — FLECAINIDE ACETATE 50 MG PO TABS: 50.0000 mg | ORAL_TABLET | Freq: Two times a day (BID) | ORAL | Status: DC

## 2018-08-21 MED ORDER — PHENYLEPHRINE 40 MCG/ML (10ML) SYRINGE FOR IV PUSH (FOR BLOOD PRESSURE SUPPORT)
PREFILLED_SYRINGE | INTRAVENOUS | Status: DC | PRN
  Administered 2018-08-21 (×2): 80 ug via INTRAVENOUS

## 2018-08-21 MED ORDER — SUCCINYLCHOLINE CHLORIDE 200 MG/10ML IV SOSY
PREFILLED_SYRINGE | INTRAVENOUS | Status: DC | PRN
  Administered 2018-08-21: 100 mg via INTRAVENOUS

## 2018-08-21 MED ORDER — DEXAMETHASONE SODIUM PHOSPHATE 10 MG/ML IJ SOLN
INTRAMUSCULAR | Status: DC | PRN
  Administered 2018-08-21: 4 mg via INTRAVENOUS

## 2018-08-21 MED ORDER — INDOMETHACIN 50 MG RE SUPP
RECTAL | Status: AC
  Filled 2018-08-21: qty 2

## 2018-08-21 MED ORDER — PROPOFOL 10 MG/ML IV BOLUS
INTRAVENOUS | Status: DC | PRN
  Administered 2018-08-21: 40 mg via INTRAVENOUS

## 2018-08-21 MED ORDER — ROCURONIUM BROMIDE 50 MG/5ML IV SOSY
PREFILLED_SYRINGE | INTRAVENOUS | Status: DC | PRN
  Administered 2018-08-21: 20 mg via INTRAVENOUS

## 2018-08-21 MED ORDER — ONDANSETRON HCL 4 MG/2ML IJ SOLN
INTRAMUSCULAR | Status: DC | PRN
  Administered 2018-08-21: 4 mg via INTRAVENOUS

## 2018-08-21 MED ORDER — FENTANYL CITRATE (PF) 250 MCG/5ML IJ SOLN
INTRAMUSCULAR | Status: DC | PRN
  Administered 2018-08-21: 25 ug via INTRAVENOUS

## 2018-08-21 MED ORDER — PANTOPRAZOLE SODIUM 40 MG PO TBEC
40.0000 mg | DELAYED_RELEASE_TABLET | Freq: Every day | ORAL | Status: DC
  Administered 2018-08-22 – 2018-08-26 (×5): 40 mg via ORAL
  Filled 2018-08-21 (×6): qty 1

## 2018-08-21 MED ORDER — LACTATED RINGERS IV SOLN
INTRAVENOUS | Status: DC
  Administered 2018-08-21 (×2): via INTRAVENOUS

## 2018-08-21 MED ORDER — CARVEDILOL 3.125 MG PO TABS: 3.1250 mg | ORAL_TABLET | Freq: Two times a day (BID) | ORAL | Status: DC

## 2018-08-21 MED ORDER — SUGAMMADEX SODIUM 200 MG/2ML IV SOLN
INTRAVENOUS | Status: DC | PRN
  Administered 2018-08-21: 125 mg via INTRAVENOUS

## 2018-08-21 MED ORDER — METOPROLOL TARTRATE 25 MG PO TABS
37.5000 mg | ORAL_TABLET | Freq: Three times a day (TID) | ORAL | Status: DC
  Filled 2018-08-21: qty 1

## 2018-08-21 MED ORDER — INDOMETHACIN 50 MG RE SUPP
RECTAL | Status: DC | PRN
  Administered 2018-08-21: 100 mg via RECTAL

## 2018-08-21 MED ORDER — TRIAMCINOLONE ACETONIDE 0.1 % EX OINT
1.0000 "application " | TOPICAL_OINTMENT | Freq: Two times a day (BID) | CUTANEOUS | Status: DC | PRN
  Filled 2018-08-21: qty 15

## 2018-08-21 NOTE — Brief Op Note (Signed)
08/15/2018 - 08/21/2018  11:28 AM  PATIENT:  Kylie May  85 y.o. female  PRE-OPERATIVE DIAGNOSIS:  CBD obstruction  POST-OPERATIVE DIAGNOSIS:  cbd stone with sphincterotomy with stone extraction and pancreatic stent placement  PROCEDURE:  Procedure(s): ENDOSCOPIC RETROGRADE CHOLANGIOPANCREATOGRAPHY (ERCP) (N/A) REMOVAL OF STONES SPHINCTEROTOMY BILIARY STENT PLACEMENT  SURGEON:  Surgeon(s) and Role:    [name], MD - Primary  PHYSICIAN ASSISTANT:   ASSISTANTS: Burtis Junes, RN, Janie Billups, Tech  ANESTHESIA:   MAC  EBL:  Minimal  BLOOD ADMINISTERED:none  DRAINS: none   LOCAL MEDICATIONS USED:  NONE  SPECIMEN:  No Specimen  DISPOSITION OF SPECIMEN:  N/A  COUNTS:  YES  TOURNIQUET:  * No tourniquets in log *  DICTATION: .Dragon Dictation  PLAN OF CARE: Admit to inpatient   PATIENT DISPOSITION:  PACU - hemodynamically stable.   Delay start of Pharmacological VTE agent (>24hrs) due to surgical blood loss or risk of bleeding: no

## 2018-08-21 NOTE — Interval H&P Note (Signed)
History and Physical Interval Note: 89/female with abnormal cholangiogram, possible distal CBD obstruction, post cholecystostomy drain placement for an ERCP.  08/21/2018 8:18 AM  Kylie May  has presented today for ERCP, with the diagnosis of CBD obstruction.  The various methods of treatment have been discussed with the patient and family. After consideration of risks, benefits and other options for treatment, the patient has consented to  Procedure(s): ENDOSCOPIC RETROGRADE CHOLANGIOPANCREATOGRAPHY (ERCP) (N/A) as a surgical intervention.  The patient's history has been reviewed, patient examined, no change in status, stable for surgery.  I have reviewed the patient's chart and labs.  Questions were answered to the patient's satisfaction.     [name]

## 2018-08-21 NOTE — Transfer of Care (Signed)
Immediate Anesthesia Transfer of Care Note  Patient: Kylie May  Procedure(s) Performed: ENDOSCOPIC RETROGRADE CHOLANGIOPANCREATOGRAPHY (ERCP) (N/A ) REMOVAL OF STONES SPHINCTEROTOMY BILIARY STENT PLACEMENT  Patient Location: PACU  Anesthesia Type:General  Level of Consciousness: awake, alert , oriented and patient cooperative  Airway & Oxygen Therapy: Patient Spontanous Breathing and Patient connected to face mask oxygen  Post-op Assessment: Report given to RN and Post -op Vital signs reviewed and stable  Post vital signs: Reviewed and stable  Last Vitals:  Vitals Value Taken Time  BP    Temp    Pulse    Resp    SpO2      Last Pain:  Vitals:   08/21/18 0801  TempSrc: Oral  PainSc: 5       Patients Stated Pain Goal: 0 (42/90/37 9558)  Complications: No apparent anesthesia complications

## 2018-08-21 NOTE — Progress Notes (Signed)
Progress Note  Patient Name: Kylie May Date of Encounter: 08/21/2018  Primary Cardiologist: Ida Rogue, MD   Subjective   Did well with ERCP. Mildly edematous, but no dyspnea. Improved rate control, although still not adequate.  Inpatient Medications    Scheduled Meds:  furosemide  20 mg Intravenous Once   [START ON 08/22/2018] magnesium chloride  2 tablet Oral Daily   mouth rinse  15 mL Mouth Rinse BID   metoprolol tartrate  37.5 mg Oral Q8H   [START ON 08/22/2018] pantoprazole  40 mg Oral Daily   rosuvastatin  20 mg Oral Daily   sodium chloride flush  5 mL Intracatheter Q8H   Continuous Infusions:  sodium chloride 10 mL/hr at 08/19/18 1100   amiodarone 30 mg/hr (08/21/18 0930)   piperacillin-tazobactam (ZOSYN)  IV 3.375 g (08/21/18 0622)   PRN Meds: acetaminophen, diphenhydrAMINE, ondansetron (ZOFRAN) IV, oxyCODONE, triamcinolone ointment, white petrolatum   Vital Signs    Vitals:   08/21/18 1210 08/21/18 1215 08/21/18 1218 08/21/18 1346  BP: 98/64 (!) 88/58 95/61 106/81  Pulse: (!) 113 100 (!) 103 (!) 103  Resp: (!) 26 (!) 28 (!) 25 (!) 25  Temp:    97.9 F (36.6 C)  TempSrc:    Oral  SpO2: 92% 92% 91% 94%  Weight:      Height:        Intake/Output Summary (Last 24 hours) at 08/21/2018 1458 Last data filed at 08/21/2018 1122 Gross per 24 hour  Intake 860 ml  Output 300 ml  Net 560 ml   Last 3 Weights 08/21/2018 08/21/2018 08/20/2018  Weight (lbs) 125 lb 7.1 oz 125 lb 8 oz 124 lb 12.8 oz  Weight (kg) 56.9 kg 56.926 kg 56.609 kg      Telemetry    AFib w RVR - Personally Reviewed  ECG    No new tracing - Personally Reviewed  Physical Exam  Appears comfortable lying in bed at 30 degrees HOB elevation GEN: No acute distress.   Neck: No JVD Cardiac: irregular, no murmurs, rubs, or gallops.  Respiratory: Clear to auscultation bilaterally. GI: Soft, nontender, non-distended  MS: 1+ edema; No deformity. Neuro:  Nonfocal  Psych:  Normal affect   Labs    Chemistry Recent Labs  Lab 08/16/18 0938 08/16/18 2038 08/17/18 0324 08/19/18 0310  NA  --  135 137 135  K  --  3.8 4.1 3.8  CL  --  109 114* 109  CO2  --  20* 15* 20*  GLUCOSE  --  77 75 99  BUN  --  24* 26* 16  CREATININE  --  1.23* 1.20* 0.97  CALCIUM  --  7.6* 7.6* 8.6*  PROT 5.2*  --  5.2* 5.2*  ALBUMIN 2.5*  --  2.2* 2.2*  AST 106*  --  68* 22  ALT 117*  --  84* 46*  ALKPHOS 128*  --  114 93  BILITOT 2.6*  --  1.6* 1.2  GFRNONAA  --  38* 39* 50*  GFRAA  --  43* 45* 58*  ANIONGAP  --  6 8 6      Hematology Recent Labs  Lab 08/16/18 0938 08/17/18 0324 08/19/18 0310  WBC 14.8* 13.7* 8.8  RBC 3.60* 3.64* 3.90  HGB 10.0* 10.3* 11.3*  HCT 31.2* 33.2* 33.1*  MCV 86.7 91.2 84.9  MCH 27.8 28.3 29.0  MCHC 32.1 31.0 34.1  RDW 13.2 13.4 13.3  PLT 204 217 288    Cardiac Enzymes  Recent Labs  Lab 08/14/18 2055  TROPONINI <0.03   No results for input(s): TROPIPOC in the last 168 hours.   BNP Recent Labs  Lab 08/15/18 2040  BNP 1,005.7*     DDimer No results for input(s): DDIMER in the last 168 hours.   Radiology    Dg Ercp Biliary & Pancreatic Ducts  Result Date: 08/21/2018 CLINICAL DATA:  Balloon stone retrieval EXAM: ERCP TECHNIQUE: Multiple spot images obtained with the fluoroscopic device and submitted for interpretation post-procedure. FLUOROSCOPY TIME:  Fluoroscopy Time:  9 minutes and 18 seconds Radiation Exposure Index (if provided by the fluoroscopic device): Number of Acquired Spot Images: 0 COMPARISON:  None. FINDINGS: Initial imaging demonstrates several filling defects in the common bile duct. Balloon stone retrieval is documented. The final image demonstrates clearance of the filling defects. IMPRESSION: See above. These images were submitted for radiologic interpretation only. Please see the procedural report for the amount of contrast and the fluoroscopy time utilized. Electronically Signed   By: Marybelle Killings M.D.   On:  08/21/2018 11:32    Cardiac Studies   Echocardiogram 08/17/2018 IMPRESSIONS 1. The left ventricle has normal systolic function, with an ejection fraction of 60-65%. The cavity size was normal. Left ventricular diastolic function could not be evaluated secondary to atrial fibrillation. No evidence of left ventricular regional  wall motion abnormalities. 2. The right ventricle has normal systolc function. The cavity was normal. There is no increase in right ventricular wall thickness. Right ventricular systolic pressure is mildly elevated with an estimated pressure of 43.5 mmHg. 3. Left atrial size was moderately dilated. 4. Right atrial size was moderately dilated. 5. Small pericardial effusion. 6. The pericardial effusion is circumferential. 7. The aortic valve is tricuspid Mild thickening of the aortic valve Mild calcification of the aortic valve. 8. The inferior vena cava was dilated in size with <50% respiratory variability.  Patient Profile     85 y.o. female with a hx of paroxysmal asymptomaticatrial fibrillationon Eliquis,mild diastolic CHF,HTN, hiatal hernia, osteopenia, degenerative joint disease, scoliosis, hearing lossadmitted with acute cholecystitis/ choledocholithiasis, complicated by persistentatrial fibrillationwith RVR, difficult to rate control due to low BP.  Assessment & Plan    1. AFib: increase metoprolol. Continue amiodarone IV until eating a full diet. Switch to amiodarone PO once HR<100 and eating regular diet. 2. CHF:  Will start a low dose of IV diuretic while monitoring the BP.     For questions or updates, please contact [place] Please consult [email or web address] for contact info under        Signed, Sanda Klein, MD  08/21/2018, 2:58 PM

## 2018-08-21 NOTE — Anesthesia Procedure Notes (Signed)
Procedure Name: Intubation Date/Time: 08/21/2018 9:50 AM Performed by: Renato Shin, CRNA Pre-anesthesia Checklist: Patient identified, Emergency Drugs available, Suction available and Patient being monitored Patient Re-evaluated:Patient Re-evaluated prior to induction Oxygen Delivery Method: Circle system utilized Preoxygenation: Pre-oxygenation with 100% oxygen Induction Type: IV induction and Rapid sequence Laryngoscope Size: Miller and 2 Grade View: Grade I Tube type: Oral Tube size: 7.0 mm Number of attempts: 1 Airway Equipment and Method: Stylet Placement Confirmation: ETT inserted through vocal cords under direct vision,  positive ETCO2 and breath sounds checked- equal and bilateral Secured at: 21 cm Tube secured with: Tape Dental Injury: Teeth and Oropharynx as per pre-operative assessment

## 2018-08-21 NOTE — Progress Notes (Signed)
Referring Physician(s): Dr Georgiann Mohs  Supervising Physician: Corrie Mckusick  Patient Status:  [facility] - In-pt  Chief Complaint:  Perc chole placed 3/15 3/17 cholangiogram:  IMPRESSION: 1. 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 Consider endoscopic evaluation and brush biopsy to exclude neoplasm.  Subjective:  ERCP today:  PRE-OPERATIVE DIAGNOSIS:  CBD obstruction POST-OPERATIVE DIAGNOSIS:  cbd stone with sphincterotomy with stone extraction and pancreatic stent placement  PROCEDURE:  Procedure(s): ENDOSCOPIC RETROGRADE CHOLANGIOPANCREATOGRAPHY (ERCP) (N/A) REMOVAL OF STONES SPHINCTEROTOMY BILIARY STENT PLACEMENT  IR drain intact 30 cc bilious OP in bag Pt groggy but states she is without pain  Allergies: Metronidazole and Penicillin g  Medications: Prior to Admission medications   Medication Sig Start Date End Date Taking? Authorizing Provider  carvedilol (COREG) 3.125 MG tablet Take 1 tablet (3.125 mg total) by mouth 2 (two) times daily with a meal. 08/04/18  Yes Gollan, Kathlene November, MD  flecainide (TAMBOCOR) 50 MG tablet Take 1 tablet (50 mg total) by mouth 2 (two) times daily. 08/04/18  Yes Gollan, Kathlene November, MD  magnesium chloride (SLOW-MAG) 64 MG TBEC SR tablet Take 2 tablets (128 mg total) by mouth daily. 12/18/17  Yes [name], MD  omeprazole (PRILOSEC) 40 MG capsule Take 1 capsule (40 mg total) by mouth daily. 05/13/18 08/17/18 Yes [name], MD  rosuvastatin (CRESTOR) 20 MG tablet Take 1 tablet (20 mg total) by mouth daily. 08/04/18  Yes Minna Merritts, MD  triamcinolone ointment (KENALOG) 0.1 % Apply 1 application topically 2 (two) times daily as needed (rash).  07/29/18  Yes [provider]     Vital Signs: BP 106/81 (BP Location: Right Arm)   Pulse (!) 103   Temp 97.9 F (36.6 C) (Oral)   Resp (!) 25   Ht 4' 9.01" (1.448 m)   Wt 125 lb 7.1 oz (56.9 kg)   SpO2 94%   BMI 27.14 kg/m   Physical Exam Vitals  signs reviewed.  Skin:    General: Skin is warm and dry.     Comments: Site is clean and dry NT No bleeding OP bilious-- 30 cc in bag     Imaging: Dg Ercp Biliary & Pancreatic Ducts  Result Date: 08/21/2018 CLINICAL DATA:  Balloon stone retrieval EXAM: ERCP TECHNIQUE: Multiple spot images obtained with the fluoroscopic device and submitted for interpretation post-procedure. FLUOROSCOPY TIME:  Fluoroscopy Time:  9 minutes and 18 seconds Radiation Exposure Index (if provided by the fluoroscopic device): Number of Acquired Spot Images: 0 COMPARISON:  None. FINDINGS: Initial imaging demonstrates several filling defects in the common bile duct. Balloon stone retrieval is documented. The final image demonstrates clearance of the filling defects. IMPRESSION: See above. These images were submitted for radiologic interpretation only. Please see the procedural report for the amount of contrast and the fluoroscopy time utilized. Electronically Signed   By: Marybelle Killings M.D.   On: 08/21/2018 11:32   Ir Cholangiogram Existing Tube  Result Date: 08/18/2018 INDICATION: Biliary ductal dilatation status post decompressive cholecystostomy catheter placement. EXAM: CHOLANGIOGRAM THROUGH EXISTING CATHETER MEDICATIONS: None indicated ANESTHESIA/SEDATION: None required FLUOROSCOPY TIME:  42 seconds; 5 mGy COMPLICATIONS: None immediate. PROCEDURE: Informed written consent was obtained from the patient and family after a thorough discussion of the procedural risks, benefits and alternatives. All questions were addressed. Maximal Sterile Barrier Technique was utilized including caps, mask, sterile gowns, sterile gloves, sterile drape, hand hygiene and skin antiseptic. A timeout was performed prior to the initiation of the procedure.  Scout radiograph shows stable position of the cholecystostomy catheter. Contrast injection shows normal filling of the gallbladder. Cystic duct is patent. No filling defects in the CBD. There is  a short segment eccentric shelf-like area of high-grade stenosis in the distal CBD just proximal to the ampulla. A small amount of contrast traverses this area into the duodenum. Partial opacification of the pancreatic duct in the head of the pancreas, which is nondilated. IMPRESSION: 1. 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 Consider endoscopic evaluation and brush biopsy to exclude neoplasm. Electronically Signed   By: Lucrezia Europe M.D.   On: 08/18/2018 16:49    Labs:  CBC: Recent Labs    08/15/18 2040 08/16/18 0938 08/17/18 0324 08/19/18 0310  WBC 15.0* 14.8* 13.7* 8.8  HGB 9.6* 10.0* 10.3* 11.3*  HCT 29.9* 31.2* 33.2* 33.1*  PLT 203 204 217 288    COAGS: Recent Labs    07/05/18 1655 07/07/18 0533 08/15/18 2040  INR 1.09  --  1.3*  APTT  --  113*  --     BMP: Recent Labs    08/16/18 0428 08/16/18 2038 08/17/18 0324 08/19/18 0310  NA 135 135 137 135  K 3.8 3.8 4.1 3.8  CL 106 109 114* 109  CO2 19* 20* 15* 20*  GLUCOSE 88 77 75 99  BUN 16 24* 26* 16  CALCIUM 8.0* 7.6* 7.6* 8.6*  CREATININE 1.17* 1.23* 1.20* 0.97  GFRNONAA 40* 38* 39* 50*  GFRAA 46* 43* 45* 58*    LIVER FUNCTION TESTS: Recent Labs    08/15/18 2040 08/16/18 0938 08/17/18 0324 08/19/18 0310  BILITOT 3.6* 2.6* 1.6* 1.2  AST 180* 106* 68* 22  ALT 153* 117* 84* 46*  ALKPHOS 136* 128* 114 93  PROT 5.6* 5.2* 5.2* 5.2*  ALBUMIN 2.7* 2.5* 2.2* 2.2*    Assessment and Plan:  Perc chole drain intact Placed 08/16/18 ERCP today: Bili stent placed wiln need chole drain until GB removal: plan per Dr Georgiann Cocker Will need flushed daily with 10 cc sterile saline and follow up with IR Clinic (unless has drain removed with GB with CCS)   Electronically Signed: Lavonia Drafts, PA-C 08/21/2018, 2:28 PM   I spent a total of 15 Minutes at the the patient's bedside AND on the patient's hospital floor or unit, greater than 50% of which was counseling/coordinating care for chole  drain

## 2018-08-21 NOTE — Anesthesia Preprocedure Evaluation (Addendum)
Anesthesia Evaluation  Patient identified by MRN, date of birth, ID band Patient awake    Reviewed: Allergy & Precautions, NPO status , Patient's Chart, lab work & pertinent test results  Airway Mallampati: II  TM Distance: >3 FB Neck ROM: Full    Dental no notable dental hx. (+) Dental Advisory Given   Pulmonary neg pulmonary ROS, former smoker,    Pulmonary exam normal breath sounds clear to auscultation       Cardiovascular hypertension, +CHF  Normal cardiovascular exam+ dysrhythmias (in afib with RVR upon admission now on amiodarone infusion) Atrial Fibrillation + Valvular Problems/Murmurs MVP and MR  Rhythm:Regular Rate:Normal  TTE 08/2018  1. The left ventricle has normal systolic function, with an ejection fraction of 60-65%. The cavity size was normal. Left ventricular diastolic function could not be evaluated secondary to atrial fibrillation. No evidence of left ventricular regional  wall motion abnormalities.  2. The right ventricle has normal systolc function. The cavity was normal. There is no increase in right ventricular wall thickness. Right ventricular systolic pressure is mildly elevated with an estimated pressure of 43.5 mmHg.  3. Left atrial size was moderately dilated.  4. Right atrial size was moderately dilated.  5. Small pericardial effusion.  6. The pericardial effusion is circumferential.  7. The aortic valve is tricuspid Mild thickening of the aortic valve Mild calcification of the aortic valve.  8. The inferior vena cava was dilated in size with <50% respiratory variability.   Neuro/Psych negative neurological ROS  negative psych ROS   GI/Hepatic Neg liver ROS, GERD  Controlled,  Endo/Other  negative endocrine ROS  Renal/GU negative Renal ROS  negative genitourinary   Musculoskeletal negative musculoskeletal ROS (+)   Abdominal   Peds negative pediatric ROS (+)  Hematology negative hematology  ROS (+)   Anesthesia Other Findings CBD obstruction  Reproductive/Obstetrics negative OB ROS                           Anesthesia Physical Anesthesia Plan  ASA: III  Anesthesia Plan: General   Post-op Pain Management:    Induction: Intravenous  PONV Risk Score and Plan: 3 and Ondansetron, Dexamethasone and Treatment may vary due to age or medical condition  Airway Management Planned: Oral ETT  Additional Equipment:   Intra-op Plan:   Post-operative Plan: Extubation in OR  Informed Consent: I have reviewed the patients History and Physical, chart, labs and discussed the procedure including the risks, benefits and alternatives for the proposed anesthesia with the patient or authorized representative who has indicated his/her understanding and acceptance.     Dental advisory given  Plan Discussed with: CRNA  Anesthesia Plan Comments:         Anesthesia Quick Evaluation

## 2018-08-21 NOTE — Op Note (Signed)
[facility] Patient Name: Kylie May Procedure Date : 08/21/2018 MRN: [id number] Attending MD: [name] , MD Date of Birth: 22-Oct-1979 CSN: [id number] Age: 85 Admit Type: Inpatient Procedure:                ERCP Indications:              Common bile duct stone(s), abnormal cholangiogram,                            s/p cholecystostomy drain placement Providers:                [name], MD, Burtis Junes, RN, Cherylynn Ridges,                            Technician Referring MD:              Medicines:                Monitored Anesthesia Care Complications:            No immediate complications. Estimated blood loss:                            Minimal. Estimated Blood Loss:     Estimated blood loss was minimal. Procedure:                Pre-Anesthesia Assessment:                           - Prior to the procedure, a History and Physical                            was performed, and patient medications and                            allergies were reviewed. The patient's tolerance of                            previous anesthesia was also reviewed. The risks                            and benefits of the procedure and the sedation                            options and risks were discussed with the patient.                            All questions were answered, and informed consent                            was obtained. Prior Anticoagulants: The patient has                            taken Eliquis (apixaban), last dose was 7 days                            prior to procedure.  ASA Grade Assessment: IV - A                            patient with severe systemic disease that is a                            constant threat to life. After reviewing the risks                            and benefits, the patient was deemed in                            satisfactory condition to undergo the procedure.                           After obtaining informed consent, the scope was                            passed under direct vision. Throughout the                            procedure, the patient's blood pressure, pulse, and                            oxygen saturations were monitored continuously. The                            TJF-Q180V ([id number]) Olympus duodenoscope was                            introduced through the mouth, and used to inject                            contrast into and used to inject contrast into the                            bile duct. The ERCP was extremely difficult due to                            challenging cannulation because of abnormal anatomy                            and challenging cannulation because of                            intradiverticular papilla. The patient tolerated                            the procedure well. Scope In: Scope Out: Findings:      The scout film was normal. The esophagus was successfully intubated       under direct vision. The scope was advanced to a normal major papilla in       the descending duodenum without detailed examination of the pharynx,  larynx and associated structures, and upper GI tract. The upper GI tract       was grossly normal.      Small clean based ulcers were noted in the duodenum.      There were two diverticulae noted in the second portion of duodenum and       the ampulla was within the ridge between the two diverticulae, inside       the larget diverticulum.      After multiple cannulation attempts with a standard sphinctertome, a       tapered sphinctertome was used and the pancreatic duct was cannulated on       several occasions. Attempts were made to cannulate using a double wire       technique which was unsucessful.      One 4 Fr by 3 cm plastic stent with a single external flap and a single       internal flap was placed 2.5 cm into the ventral pancreatic duct. Clear       fluid flowed through the stent. The stent was in good position.      The bile duct was  deeply subsequently cannulated with the tapered       sphincterotome. Contrast was injected. I personally interpreted the bile       duct images. There was brisk flow of contrast through the ducts. Image       quality was excellent. Contrast extended to the entire biliary tree. The       lower third of the main bile duct contained three stones, the largest of       which was 5 mm in diameter.      The lower third of the main bile duct, middle third of the main bile       duct and upper third of the main bile duct were moderately dilated. The       largest diameter was 12 mm. A straight Roadrunner wire was passed into       the biliary tree. A 10 mm biliary sphincterotomy was made with a braided       traction (standard) sphincterotome using ERBE electrocautery. The       sphinctertome could be readily moved in and out through the ampulla in       fully bowed position.      There was no post-sphincterotomy bleeding. The biliary tree was swept       with a 12 mm balloon starting at the bifurcation.      Three stones were removed. No stones remained.      The pancreatic duct was not injected during the procedure. Impression:               - The upper third of the main bile duct, middle                            third of the main bile duct and lower third of the                            main bile duct were moderately dilated.                           - Choledocholithiasis was found. Complete removal  was accomplished by biliary sphincterotomy and                            balloon extraction.                           - A biliary sphincterotomy was performed.                           - The biliary tree was swept.                           - One plastic stent was placed into the ventral                            pancreatic duct.                           - Small clean based duodenum ulcers noted. Moderate Sedation:      Patient did not receive moderate sedation  for this procedure, but       instead received monitored anesthesia care. Recommendation:           - Clear liquid diet.                           - Abdominal xray in 2-4 weeks, if pancreatic stent                            falls off spontaneusly, no further therapy                            needed.Otherwise, will need EGD to remove                            pancreatic stent.                           - PPI once daily indefinitely.                           - Ok to start anticoagulation in am from GI stand                            point. Procedure Code(s):        --- Professional ---                           [phone], Endoscopic retrograde                            cholangiopancreatography (ERCP); with placement of                            endoscopic stent into biliary or pancreatic duct,                            including  pre- and post-dilation and guide wire                            passage, when performed, including sphincterotomy,                            when performed, each stent                           43264, Endoscopic retrograde                            cholangiopancreatography (ERCP); with removal of                            calculi/debris from biliary/pancreatic duct(s)                           43262, 59, Endoscopic retrograde                            cholangiopancreatography (ERCP); with                            sphincterotomy/papillotomy Diagnosis Code(s):        --- Professional ---                           K80.50, Calculus of bile duct without cholangitis                            or cholecystitis without obstruction                           K83.8, Other specified diseases of biliary tract CPT copyright 2018 American [employer] Association. All rights reserved. The codes documented in this report are preliminary and upon coder review may  be revised to meet current compliance requirements. [name], MD 08/21/2018 11:28:16 AM This report has been  signed electronically. Number of Addenda: 0

## 2018-08-21 NOTE — Progress Notes (Signed)
Paged Dr Jenkins Rouge about families concern to speak with cardiology before signing consent for this morning's procedure.

## 2018-08-21 NOTE — Progress Notes (Signed)
[facility]  PROGRESS NOTE  Kylie SHANNAHAN VHQ:[id number] DOB: 1921-07-09 DOA: 08/15/2018 PCP: [name], MD   Brief HPI:   85 year old female with a history of atrial fibrillation who came with CBD stone resulting in septic shock.  GI did not feel comfortable taking her to endoscopy due to cardiopulmonary instability.  General surgery also felt that she was unstable for surgery.  PCCM was consulted for stabilization prior to consideration for IR drainage.  Patient underwent cholecystostomy tube placement and now planned to undergo ERCP once her atrial fibrillation is under control.    Subjective   Patient seen and examined, status post ERCP.  Denies abdominal pain.  No chest pain or shortness of breath.   Assessment/Plan:     1. Septic shock-secondary to acute cholecystitis with obstructing CBD stone, resolved, patient is on Zosyn.  Percutaneous IR drain placed on 08/16/2018.  Status post ERCP with sphincterotomy, biliary stent placement and removal of stones.   2. Chronic atrial fibrillation-patient currently having acute RVR, Eliquis on hold for ERCP.  Amiodarone was stopped in outpatient setting due to elevated TSH.  Patient was started on IV amiodarone and dose of metoprolol increased to 25 mg every 8 hours, and heart rate has improved to 100s.  Cardiology following.  3. Chronic diastolic CHF-no signs of volume overload.  1 dose of Lasix 40 mg IV given per cardiology  4. Acute kidney injury-secondary to septic shock, improving with hydration.  Creatinine is 1.7.  5. Nausea/epigastric discomfort-start Protonix 40 mg IV daily, continue Zofran PRN for nausea.     CBG: Recent Labs  Lab 08/15/18 2203 08/16/18 0413 08/16/18 1610  GLUCAP 99 82 76    CBC: Recent Labs  Lab 08/14/18 2055 08/15/18 1413 08/15/18 2040 08/16/18 0938 08/17/18 0324 08/19/18 0310  WBC 17.9*  18.1* 16.8* 15.0* 14.8* 13.7* 8.8  NEUTROABS 15.6*  --  12.9* 13.0* 11.3*  --   HGB  11.0*  11.0* 9.8* 9.6* 10.0* 10.3* 11.3*  HCT 33.5*  33.6* 29.2* 29.9* 31.2* 33.2* 33.1*  MCV 88.9  88.9 86.6 87.9 86.7 91.2 84.9  PLT 292  284 216 203 204 217 413    Basic Metabolic Panel: Recent Labs  Lab 08/15/18 2040 08/16/18 0428 08/16/18 2038 08/17/18 0324 08/18/18 0342 08/19/18 0310  NA 133* 135 135 137  --  135  K 3.8 3.8 3.8 4.1  --  3.8  CL 103 106 109 114*  --  109  CO2 21* 19* 20* 15*  --  20*  GLUCOSE 99 88 77 75  --  99  BUN 17 16 24* 26*  --  16  CREATININE 1.36* 1.17* 1.23* 1.20*  --  0.97  CALCIUM 7.9* 8.0* 7.6* 7.6*  --  8.6*  MG 1.1* 2.0 1.9 1.9 2.0 1.7     DVT prophylaxis: SCDs  Code Status: Limited code  Family Communication: Discussed with family at bedside  Disposition Plan: likely home when medically ready for discharge     Consultants:  General surgery  Intervention radiology  Procedures:  Cholecystostomy tube placement   Antibiotics:   Anti-infectives (From admission, onward)   Start     Dose/Rate Route Frequency Ordered Stop   08/16/18 1740  vancomycin (VANCOCIN) 1-5 GM/200ML-% IVPB  Status:  Discontinued    Note to Pharmacy:  Desiree Hane   : cabinet override      08/16/18 1740 08/16/18 1824   08/15/18 2200  cefTRIAXone (ROCEPHIN) 2 g in sodium chloride 0.9 %  100 mL IVPB  Status:  Discontinued     2 g 200 mL/hr over 30 Minutes Intravenous Every 24 hours 08/15/18 1957 08/15/18 2005   08/15/18 2200  piperacillin-tazobactam (ZOSYN) IVPB 3.375 g     3.375 g 12.5 mL/hr over 240 Minutes Intravenous Every 8 hours 08/15/18 2036     08/15/18 2000  metroNIDAZOLE (FLAGYL) IVPB 500 mg  Status:  Discontinued     500 mg 100 mL/hr over 60 Minutes Intravenous Every 8 hours 08/15/18 1957 08/15/18 2005       Objective   Vitals:   08/21/18 1210 08/21/18 1215 08/21/18 1218 08/21/18 1346  BP: 98/64 (!) 88/58 95/61 106/81  Pulse: (!) 113 100 (!) 103 (!) 103  Resp: (!) 26 (!) 28 (!) 25 (!) 25  Temp:    97.9 F (36.6 C)   TempSrc:    Oral  SpO2: 92% 92% 91% 94%  Weight:      Height:        Intake/Output Summary (Last 24 hours) at 08/21/2018 1551 Last data filed at 08/21/2018 1122 Gross per 24 hour  Intake 860 ml  Output 300 ml  Net 560 ml   Filed Weights   08/20/18 0659 08/21/18 0415 08/21/18 0801  Weight: 56.6 kg 56.9 kg 56.9 kg     Physical Examination:   General: Appears in no acute distress  Cardiovascular: S1-S2, regular  Respiratory: Clear to auscultation bilaterally  Abdomen: Abdomen is soft nontender, drain in place  Extremities: No edema of the lower extremities  Neurologic: Alert, oriented x3, no focal deficit noted.      Data Reviewed: I have personally reviewed following labs and imaging studies   Recent Results (from the past 240 hour(s))  Culture, blood (routine x 2)     Status: None   Collection Time: 08/15/18  3:25 PM  Result Value Ref Range Status   Specimen Description BLOOD RIGHT [facility]  Final   Special Requests BOTTLES DRAWN AEROBIC AND ANAEROBIC  Final   Culture   Final    NO GROWTH 5 DAYS Performed at [facility], [address] [place], [place] [address]    Report Status 08/20/2018 FINAL  Final  Culture, blood (routine x 2)     Status: None   Collection Time: 08/15/18  3:25 PM  Result Value Ref Range Status   Specimen Description BLOOD RFOA  Final   Special Requests BOTTLES DRAWN AEROBIC AND ANAEROBIC  Final   Culture   Final    NO GROWTH 5 DAYS Performed at [facility], [address], [place],  [address]    Report Status 08/20/2018 FINAL  Final  Culture, blood (Routine X 2) w Reflex to ID Panel     Status: None   Collection Time: 08/16/18  3:38 PM  Result Value Ref Range Status   Specimen Description BLOOD RIGHT HAND  Final   Special Requests AEROBIC BOTTLE ONLY Blood Culture adequate volume  Final   Culture   Final    NO GROWTH 5 DAYS Performed at [facility], [place] [address] [place],  [address]     Report Status 08/21/2018 FINAL  Final  Culture, blood (Routine X 2) w Reflex to ID Panel     Status: None   Collection Time: 08/16/18  3:44 PM  Result Value Ref Range Status   Specimen Description BLOOD LEFT HAND  Final   Special Requests AEROBIC BOTTLE ONLY Blood Culture adequate volume  Final   Culture  Final    NO GROWTH 5 DAYS Performed at [facility], [place] [address] [place], [place] [address]    Report Status 08/21/2018 FINAL  Final  MRSA PCR Screening     Status: None   Collection Time: 08/16/18  4:53 PM  Result Value Ref Range Status   MRSA by PCR NEGATIVE NEGATIVE Final    Comment:        The GeneXpert MRSA Assay (FDA approved for NASAL specimens only), is one component of a comprehensive MRSA colonization surveillance program. It is not intended to diagnose MRSA infection nor to guide or monitor treatment for MRSA infections. Performed at [facility], [place] [address], [place], [place] [address]   Urine Culture     Status: Abnormal   Collection Time: 08/16/18  8:37 PM  Result Value Ref Range Status   Specimen Description URINE, CLEAN CATCH  Final   Special Requests NONE  Final   Culture (A)  Final    <10,000 COLONIES/mL INSIGNIFICANT GROWTH Performed at [facility], [place] [address], [place], [place] [address]    Report Status 08/18/2018 FINAL  Final     Liver Function Tests: Recent Labs  Lab 08/15/18 1413 08/15/18 2040 08/16/18 0938 08/17/18 0324 08/19/18 0310  AST 242* 180* 106* 68* 22  ALT 182* 153* 117* 84* 46*  ALKPHOS 138* 136* 128* 114 93  BILITOT 3.8* 3.6* 2.6* 1.6* 1.2  PROT 6.0* 5.6* 5.2* 5.2* 5.2*  ALBUMIN 2.9* 2.7* 2.5* 2.2* 2.2*   Recent Labs  Lab 08/14/18 2055  LIPASE 107*   No results for input(s): AMMONIA in the last 168 hours.  Cardiac Enzymes: Recent Labs  Lab 08/14/18 2055  TROPONINI <0.03   BNP (last 3 results) Recent Labs    08/15/18 2040  BNP 1,005.7*    ProBNP (last 3 results) No results for  input(s): PROBNP in the last 8760 hours.    Studies: Dg Ercp Biliary & Pancreatic Ducts  Result Date: 08/21/2018 CLINICAL DATA:  Balloon stone retrieval EXAM: ERCP TECHNIQUE: Multiple spot images obtained with the fluoroscopic device and submitted for interpretation post-procedure. FLUOROSCOPY TIME:  Fluoroscopy Time:  9 minutes and 18 seconds Radiation Exposure Index (if provided by the fluoroscopic device): Number of Acquired Spot Images: 0 COMPARISON:  None. FINDINGS: Initial imaging demonstrates several filling defects in the common bile duct. Balloon stone retrieval is documented. The final image demonstrates clearance of the filling defects. IMPRESSION: See above. These images were submitted for radiologic interpretation only. Please see the procedural report for the amount of contrast and the fluoroscopy time utilized. Electronically Signed   By: Marybelle Killings M.D.   On: 08/21/2018 11:32    Scheduled Meds: . [START ON 08/22/2018] magnesium chloride  2 tablet Oral Daily  . mouth rinse  15 mL Mouth Rinse BID  . metoprolol tartrate  37.5 mg Oral Q8H  . [START ON 08/22/2018] pantoprazole  40 mg Oral Daily  . rosuvastatin  20 mg Oral Daily  . sodium chloride flush  5 mL Intracatheter Q8H    Admission status: Inpatient: Based on patients clinical presentation and evaluation of above clinical data, I have made determination that patient meets Inpatient criteria at this time.  Time spent: 30 min  [facility] Pager [phone]. If 7PM-7AM, please contact night-coverage at [email or web address], Office  [phone]  password [email or web address]  08/21/2018, 3:51 PM  LOS: 6 days

## 2018-08-22 ENCOUNTER — Encounter (HOSPITAL_COMMUNITY): Payer: Self-pay | Admitting: Gastroenterology

## 2018-08-22 DIAGNOSIS — K8043 Calculus of bile duct with acute cholecystitis with obstruction: Secondary | ICD-10-CM

## 2018-08-22 LAB — CBC
HEMATOCRIT: 32.6 % — AB (ref 36.0–46.0)
Hemoglobin: 10.6 g/dL — ABNORMAL LOW (ref 12.0–15.0)
MCH: 27.7 pg (ref 26.0–34.0)
MCHC: 32.5 g/dL (ref 30.0–36.0)
MCV: 85.1 fL (ref 80.0–100.0)
Platelets: 301 10*3/uL (ref 150–400)
RBC: 3.83 MIL/uL — ABNORMAL LOW (ref 3.87–5.11)
RDW: 13.5 % (ref 11.5–15.5)
WBC: 8.9 10*3/uL (ref 4.0–10.5)
nRBC: 0 % (ref 0.0–0.2)

## 2018-08-22 LAB — BASIC METABOLIC PANEL
Anion gap: 9 (ref 5–15)
BUN: 14 mg/dL (ref 8–23)
CO2: 19 mmol/L — AB (ref 22–32)
Calcium: 8.6 mg/dL — ABNORMAL LOW (ref 8.9–10.3)
Chloride: 104 mmol/L (ref 98–111)
Creatinine, Ser: 1.27 mg/dL — ABNORMAL HIGH (ref 0.44–1.00)
GFR calc Af Amer: 42 mL/min — ABNORMAL LOW (ref >60)
GFR calc non Af Amer: 36 mL/min — ABNORMAL LOW (ref >60)
GLUCOSE: 139 mg/dL — AB (ref 70–99)
Potassium: 4.2 mmol/L (ref 3.5–5.1)
Sodium: 132 mmol/L — ABNORMAL LOW (ref 135–145)

## 2018-08-22 MED ORDER — METOPROLOL TARTRATE 25 MG PO TABS
25.0000 mg | ORAL_TABLET | Freq: Three times a day (TID) | ORAL | Status: DC
  Administered 2018-08-22 – 2018-08-25 (×9): 25 mg via ORAL
  Filled 2018-08-22 (×9): qty 1

## 2018-08-22 MED ORDER — FUROSEMIDE 10 MG/ML IJ SOLN
40.0000 mg | Freq: Two times a day (BID) | INTRAMUSCULAR | Status: DC
  Administered 2018-08-22 – 2018-08-23 (×3): 40 mg via INTRAVENOUS
  Filled 2018-08-22 (×3): qty 4

## 2018-08-22 NOTE — Progress Notes (Signed)
Subjective: Patient was seen and examined at bedside. She is requesting for her diet to be advanced. Denies nausea, vomiting or abdominal pain.  Objective: Vital signs in last 24 hours: Temp:  [97.3 F (36.3 C)-98.1 F (36.7 C)] 97.5 F (36.4 C) (03/21 0734) Pulse Rate:  [58-117] 93 (03/21 0734) Resp:  [14-28] 22 (03/21 0734) BP: (81-111)/(45-81) 103/73 (03/21 0734) SpO2:  [91 %-97 %] 97 % (03/21 0734) Weight:  [58.2 kg] 58.2 kg (03/21 0605) Weight change: -0.026 kg Last BM Date: 08/20/18  PE: Elderly, frail, hard of hearing GENERAL: Mild pallor, no icterus ABDOMEN: Soft, nontender, normoactive bowel sounds EXTREMITIES: No deformity  Lab Results: Results for orders placed or performed during the hospital encounter of 08/15/18 (from the past 48 hour(s))  CBC     Status: Abnormal   Collection Time: 08/22/18  3:12 AM  Result Value Ref Range   WBC 8.9 4.0 - 10.5 K/uL   RBC 3.83 (L) 3.87 - 5.11 MIL/uL   Hemoglobin 10.6 (L) 12.0 - 15.0 g/dL   HCT 32.6 (L) 36.0 - 46.0 %   MCV 85.1 80.0 - 100.0 fL   MCH 27.7 26.0 - 34.0 pg   MCHC 32.5 30.0 - 36.0 g/dL   RDW 13.5 11.5 - 15.5 %   Platelets 301 150 - 400 K/uL   nRBC 0.0 0.0 - 0.2 %    Comment: Performed at [facility], [place] [address] [place], [place] [address]  Basic metabolic panel     Status: Abnormal   Collection Time: 08/22/18  3:12 AM  Result Value Ref Range   Sodium 132 (L) 135 - 145 mmol/L   Potassium 4.2 3.5 - 5.1 mmol/L   Chloride 104 98 - 111 mmol/L   CO2 19 (L) 22 - 32 mmol/L   Glucose, Bld 139 (H) 70 - 99 mg/dL   BUN 14 8 - 23 mg/dL   Creatinine, Ser 1.27 (H) 0.44 - 1.00 mg/dL   Calcium 8.6 (L) 8.9 - 10.3 mg/dL   GFR calc non Af Amer 36 (L) >60 mL/min   GFR calc Af Amer 42 (L) >60 mL/min   Anion gap 9 5 - 15    Comment: Performed at [facility] [address] [place], [place] [address]    Studies/Results: Dg Ercp Biliary & Pancreatic Ducts  Result Date: 08/21/2018 CLINICAL DATA:  Balloon  stone retrieval EXAM: ERCP TECHNIQUE: Multiple spot images obtained with the fluoroscopic device and submitted for interpretation post-procedure. FLUOROSCOPY TIME:  Fluoroscopy Time:  9 minutes and 18 seconds Radiation Exposure Index (if provided by the fluoroscopic device): Number of Acquired Spot Images: 0 COMPARISON:  None. FINDINGS: Initial imaging demonstrates several filling defects in the common bile duct. Balloon stone retrieval is documented. The final image demonstrates clearance of the filling defects. IMPRESSION: See above. These images were submitted for radiologic interpretation only. Please see the procedural report for the amount of contrast and the fluoroscopy time utilized. Electronically Signed   By: Marybelle Killings M.D.   On: 08/21/2018 11:32    Medications: I have reviewed the patient's current medications.  Assessment: Choledocholithiasis, status post ERCP with sphincterotomy, balloon sweep and retrieval of 3 CBD stones, pancreatic stent placed  Superficial duodenal ulcers noted on ERCP  Atrial fibrillation with aVR Renal insufficiency Cholecystitis, status post percutaneous cholecystostomy  Plan: No fever, no leukocytosis, doing well postprocedure. Start low-fat diet. Timing of cholecystectomy to be decided by surgical team. Will need outpatient abdominal x-ray in a  few weeks, if pancreatic stent still in place, will need EGD for removal, otherwise no further treatment from GI standpoint. Patient will need to continue PPI indefinitely. Will sign off from GI standpoint, please recall if needed.   [name] 08/22/2018, 9:43 AM   Pager [phone] If no answer or after 5 PM call [phone]

## 2018-08-22 NOTE — Progress Notes (Signed)
Progress Note  Patient Name: Kylie May Date of Encounter: 08/22/2018  Primary Cardiologist: Ida Rogue, MD   Subjective   Appears comfortable. No real response to diuretics. Weight up another 3 lb, roughly 20 lb above usual "dry weight". Metoprolol held for last 2 doses due to SBP in high 80s (asymptomatic).  Inpatient Medications    Scheduled Meds: . furosemide  40 mg Intravenous BID  . magnesium chloride  2 tablet Oral Daily  . mouth rinse  15 mL Mouth Rinse BID  . metoprolol tartrate  25 mg Oral Q8H  . pantoprazole  40 mg Oral Daily  . rosuvastatin  20 mg Oral Daily  . sodium chloride flush  5 mL Intracatheter Q8H   Continuous Infusions: . sodium chloride 10 mL/hr at 08/19/18 1100  . amiodarone 30 mg/hr (08/22/18 1007)  . piperacillin-tazobactam (ZOSYN)  IV 3.375 g (08/22/18 0528)   PRN Meds: acetaminophen, diphenhydrAMINE, ondansetron (ZOFRAN) IV, oxyCODONE, triamcinolone ointment, white petrolatum   Vital Signs    Vitals:   08/21/18 1346 08/21/18 2226 08/22/18 0605 08/22/18 0734  BP: 106/81 (!) 81/45 90/73 103/73  Pulse: (!) 103 (!) 58 (!) 117 93  Resp: (!) 25 14 (!) 24 (!) 22  Temp: 97.9 F (36.6 C) 98 F (36.7 C) (!) 97.3 F (36.3 C) (!) 97.5 F (36.4 C)  TempSrc: Oral Oral Oral Axillary  SpO2: 94% 94% 97% 97%  Weight:   58.2 kg   Height:        Intake/Output Summary (Last 24 hours) at 08/22/2018 1134 Last data filed at 08/21/2018 2000 Gross per 24 hour  Intake -  Output 100 ml  Net -100 ml   Last 3 Weights 08/22/2018 08/21/2018 08/21/2018  Weight (lbs) 128 lb 4.9 oz 125 lb 7.1 oz 125 lb 8 oz  Weight (kg) 58.2 kg 56.9 kg 56.926 kg      Telemetry    AFib w RVR - Personally Reviewed  ECG    No new tracings - Personally Reviewed  Physical Exam  Appears comfortable GEN: No acute distress.   Neck: 8-10 cm JVD Cardiac: irregular, no murmurs, rubs, or gallops.  Respiratory: Clear to auscultation bilaterally. GI: Soft, nontender,  non-distended  MS: 1-2+ edema of the extremities; No deformity. Neuro:  Nonfocal  Psych: Normal affect   Labs    Chemistry Recent Labs  Lab 08/16/18 4235  08/17/18 0324 08/19/18 0310 08/22/18 0312  NA  --    < > 137 135 132*  K  --    < > 4.1 3.8 4.2  CL  --    < > 114* 109 104  CO2  --    < > 15* 20* 19*  GLUCOSE  --    < > 75 99 139*  BUN  --    < > 26* 16 14  CREATININE  --    < > 1.20* 0.97 1.27*  CALCIUM  --    < > 7.6* 8.6* 8.6*  PROT 5.2*  --  5.2* 5.2*  --   ALBUMIN 2.5*  --  2.2* 2.2*  --   AST 106*  --  68* 22  --   ALT 117*  --  84* 46*  --   ALKPHOS 128*  --  114 93  --   BILITOT 2.6*  --  1.6* 1.2  --   GFRNONAA  --    < > 39* 50* 36*  GFRAA  --    < > 45*  51* 42*  ANIONGAP  --    < > 8 6 9    < > = values in this interval not displayed.     Hematology Recent Labs  Lab 08/17/18 0324 08/19/18 0310 08/22/18 0312  WBC 13.7* 8.8 8.9  RBC 3.64* 3.90 3.83*  HGB 10.3* 11.3* 10.6*  HCT 33.2* 33.1* 32.6*  MCV 91.2 84.9 85.1  MCH 28.3 29.0 27.7  MCHC 31.0 34.1 32.5  RDW 13.4 13.3 13.5  PLT 217 288 301    Cardiac EnzymesNo results for input(s): TROPONINI in the last 168 hours. No results for input(s): TROPIPOC in the last 168 hours.   BNP Recent Labs  Lab 08/15/18 2040  BNP 1,005.7*     DDimer No results for input(s): DDIMER in the last 168 hours.   Radiology    Dg Ercp Biliary & Pancreatic Ducts  Result Date: 08/21/2018 CLINICAL DATA:  Balloon stone retrieval EXAM: ERCP TECHNIQUE: Multiple spot images obtained with the fluoroscopic device and submitted for interpretation post-procedure. FLUOROSCOPY TIME:  Fluoroscopy Time:  9 minutes and 18 seconds Radiation Exposure Index (if provided by the fluoroscopic device): Number of Acquired Spot Images: 0 COMPARISON:  None. FINDINGS: Initial imaging demonstrates several filling defects in the common bile duct. Balloon stone retrieval is documented. The final image demonstrates clearance of the filling  defects. IMPRESSION: See above. These images were submitted for radiologic interpretation only. Please see the procedural report for the amount of contrast and the fluoroscopy time utilized. Electronically Signed   By: Marybelle Killings M.D.   On: 08/21/2018 11:32    Cardiac Studies   Echocardiogram 08/17/2018 IMPRESSIONS 1. The left ventricle has normal systolic function, with an ejection fraction of 60-65%. The cavity size was normal. Left ventricular diastolic function could not be evaluated secondary to atrial fibrillation. No evidence of left ventricular regional  wall motion abnormalities. 2. The right ventricle has normal systolc function. The cavity was normal. There is no increase in right ventricular wall thickness. Right ventricular systolic pressure is mildly elevated with an estimated pressure of 43.5 mmHg. 3. Left atrial size was moderately dilated. 4. Right atrial size was moderately dilated. 5. Small pericardial effusion. 6. The pericardial effusion is circumferential. 7. The aortic valve is tricuspid Mild thickening of the aortic valve Mild calcification of the aortic valve. 8. The inferior vena cava was dilated in size with <50% respiratory variability.  Patient Profile     85 y.o. female with a hx of paroxysmal asymptomaticatrial fibrillationon Eliquis,mild diastolic CHF,HTN, hiatal hernia, osteopenia, degenerative joint disease, scoliosis, hearing lossadmitted with acute cholecystitis/ choledocholithiasis, complicated by persistentatrial fibrillationwith RVR, difficult to rate control due to low BP.  Assessment & Plan      1. AFib: change metoprolol dose back to 25 mg 3 x daily, but avoid holding.  Switch to amiodarone PO once HR<100 and eating regular diet. 2. CHF:  Increased the dose of diuretic.         For questions or updates, please contact [place] Please consult [email or web address] for contact info under        Signed, Sanda Klein, MD   08/22/2018, 11:34 AM

## 2018-08-22 NOTE — Progress Notes (Signed)
PROGRESS NOTE [facility]   Kylie May   Kylie May DOB: 04-Sep-1921  DOA: 08/15/2018 PCP: [name], MD   Brief Narrative:  Kylie May is a 85 year old female with a history of atrial fibrillation who came with CBD stone resulting in septic shock.  GI did not feel comfortable taking her to endoscopy due to cardiopulmonary instability.  General surgery also felt that she was unstable for surgery.  PCCM was consulted for stabilization prior to consideration for IR drainage.  Patient underwent cholecystostomy tube placement and ERCP with  stent placement.  Patient with uncontrolled A. fib on IV amiodarone followed by cardiology.  Subjective: Patient seen and examined, she report no chest pain, shortness of breath.  She still having palpitations and episodes of elevated heart rate to the 140s-150s.  Metoprolol has been skipped due to soft BPs.  She reported mild abdominal discomfort.  Not much appetite.  Assessment & Plan:   Active Problems:   Hyperlipidemia   Mitral regurgitation   Chronic diastolic CHF (congestive heart failure) (HCC)   Transaminitis   Choledocholithiasis with acute cholecystitis   Atrial fibrillation with RVR (HCC)   Sepsis (HCC)   Septic shock (HCC)   Goals of care, counseling/discussion  Septic shock secondary to acute cholecystitis with obstructing CBD stone Currently with Zosyn, percutaneous IR drain placed on 3/15 and now status post ERCP with sphincterectomy and biliary stent placement.  Improving.  Advance diet as tolerated.  Chronic persistent atrial fibrillation with acute RVR Eliquis was on hold due to ERCP.  Patient currently on IV amiodarone cardiology following.  Metoprolol was held due to soft BPs.  Continue management per cardiology.  Chronic diastolic CHF She looks edematous and weight is up, per cardiology treating with IV Lasix  AKI Due to septic shock and infectious process Improved with hydration, today  creatinine slightly up.  Avoid nephrotoxic agent and hypotension.  Will check BMP in a.m.   DVT prophylaxis: SCDs Code Status: Limited code Family Communication: Family at bedside Disposition Plan: Home when A. fib well controlled and cleared by cardiology.  Consultants:   GI  IR  Cardiology  Procedures:   Percutaneous cholecystectomy  ERCP  Antimicrobials: Anti-infectives (From admission, onward)   Start     Dose/Rate Route Frequency Ordered Stop   08/16/18 1740  vancomycin (VANCOCIN) 1-5 GM/200ML-% IVPB  Status:  Discontinued    Note to Pharmacy:  Desiree Hane   : cabinet override      08/16/18 1740 08/16/18 1824   08/15/18 2200  cefTRIAXone (ROCEPHIN) 2 g in sodium chloride 0.9 % 100 mL IVPB  Status:  Discontinued     2 g 200 mL/hr over 30 Minutes Intravenous Every 24 hours 08/15/18 1957 08/15/18 2005   08/15/18 2200  piperacillin-tazobactam (ZOSYN) IVPB 3.375 g     3.375 g 12.5 mL/hr over 240 Minutes Intravenous Every 8 hours 08/15/18 2036     08/15/18 2000  metroNIDAZOLE (FLAGYL) IVPB 500 mg  Status:  Discontinued     500 mg 100 mL/hr over 60 Minutes Intravenous Every 8 hours 08/15/18 1957 08/15/18 2005          Objective: Vitals:   08/21/18 2226 08/22/18 0605 08/22/18 0734 08/22/18 1530  BP: (!) 81/45 90/73 103/73   Pulse: (!) 58 (!) 117 93 (!) (P) 112  Resp: 14 (!) 24 (!) 22 (!) (P) 26  Temp: 98 F (36.7 C) (!) 97.3 F (36.3 C) (!) 97.5 F (36.4 C) (!) (P) 97.5 F (  36.4 C)  TempSrc: Oral Oral Axillary (P) Oral  SpO2: 94% 97% 97%   Weight:  58.2 kg    Height:        Intake/Output Summary (Last 24 hours) at 08/22/2018 1658 Last data filed at 08/22/2018 1403 Gross per 24 hour  Intake --  Output 400 ml  Net -400 ml   Filed Weights   08/21/18 0415 08/21/18 0801 08/22/18 0605  Weight: 56.9 kg 56.9 kg 58.2 kg    Examination:  General exam: Appears calm and comfortable.  Hard hearing Respiratory system: Clear to auscultation. No  wheezes,crackle or rhonchi Cardiovascular system: S1 & S2 heard, RRR. No JVD, murmurs, rubs or gallops Gastrointestinal system: Abdomen is nondistended, soft and nontender.  Drain in place [place] nervous system: Alert and oriented. No focal neurological deficits. Extremities: No pedal edema. Symmetric, strength 5/5   Skin: No rashes, lesions or ulcer   Data Reviewed: I have personally reviewed following labs and imaging studies  CBC: Recent Labs  Lab 08/15/18 2040 08/16/18 0938 08/17/18 0324 08/19/18 0310 08/22/18 0312  WBC 15.0* 14.8* 13.7* 8.8 8.9  NEUTROABS 12.9* 13.0* 11.3*  --   --   HGB 9.6* 10.0* 10.3* 11.3* 10.6*  HCT 29.9* 31.2* 33.2* 33.1* 32.6*  MCV 87.9 86.7 91.2 84.9 85.1  PLT 203 204 217 288 326   Basic Metabolic Panel: Recent Labs  Lab 08/16/18 0428 08/16/18 2038 08/17/18 0324 08/18/18 0342 08/19/18 0310 08/22/18 0312  NA 135 135 137  --  135 132*  K 3.8 3.8 4.1  --  3.8 4.2  CL 106 109 114*  --  109 104  CO2 19* 20* 15*  --  20* 19*  GLUCOSE 88 77 75  --  99 139*  BUN 16 24* 26*  --  16 14  CREATININE 1.17* 1.23* 1.20*  --  0.97 1.27*  CALCIUM 8.0* 7.6* 7.6*  --  8.6* 8.6*  MG 2.0 1.9 1.9 2.0 1.7  --    GFR: Estimated Creatinine Clearance: 19.8 mL/min (A) (by C-G formula based on SCr of 1.27 mg/dL (H)). Liver Function Tests: Recent Labs  Lab 08/15/18 2040 08/16/18 0938 08/17/18 0324 08/19/18 0310  AST 180* 106* 68* 22  ALT 153* 117* 84* 46*  ALKPHOS 136* 128* 114 93  BILITOT 3.6* 2.6* 1.6* 1.2  PROT 5.6* 5.2* 5.2* 5.2*  ALBUMIN 2.7* 2.5* 2.2* 2.2*   No results for input(s): LIPASE, AMYLASE in the last 168 hours. No results for input(s): AMMONIA in the last 168 hours. Coagulation Profile: Recent Labs  Lab 08/15/18 2040  INR 1.3*   Cardiac Enzymes: No results for input(s): CKTOTAL, CKMB, CKMBINDEX, TROPONINI in the last 168 hours. BNP (last 3 results) No results for input(s): PROBNP in the last 8760 hours. HbA1C: No results for  input(s): HGBA1C in the last 72 hours. CBG: Recent Labs  Lab 08/15/18 2203 08/16/18 0413 08/16/18 1610  GLUCAP 99 82 76   Lipid Profile: No results for input(s): CHOL, HDL, LDLCALC, TRIG, CHOLHDL, LDLDIRECT in the last 72 hours. Thyroid Function Tests: No results for input(s): TSH, T4TOTAL, FREET4, T3FREE, THYROIDAB in the last 72 hours. Anemia Panel: No results for input(s): VITAMINB12, FOLATE, FERRITIN, TIBC, IRON, RETICCTPCT in the last 72 hours. Sepsis Labs: Recent Labs  Lab 08/15/18 2358 08/16/18 0429  LATICACIDVEN 1.0 0.9    Recent Results (from the past 240 hour(s))  Culture, blood (routine x 2)     Status: None   Collection Time: 08/15/18  3:25 PM  Result Value Ref Range Status   Specimen Description BLOOD RIGHT [facility]  Final   Special Requests BOTTLES DRAWN AEROBIC AND ANAEROBIC  Final   Culture   Final    NO GROWTH 5 DAYS Performed at [facility], [address] [place], [place] [address]    Report Status 08/20/2018 FINAL  Final  Culture, blood (routine x 2)     Status: None   Collection Time: 08/15/18  3:25 PM  Result Value Ref Range Status   Specimen Description BLOOD RFOA  Final   Special Requests BOTTLES DRAWN AEROBIC AND ANAEROBIC  Final   Culture   Final    NO GROWTH 5 DAYS Performed at [facility], [address], [place], [place] [address]    Report Status 08/20/2018 FINAL  Final  Culture, blood (Routine X 2) w Reflex to ID Panel     Status: None   Collection Time: 08/16/18  3:38 PM  Result Value Ref Range Status   Specimen Description BLOOD RIGHT HAND  Final   Special Requests AEROBIC BOTTLE ONLY Blood Culture adequate volume  Final   Culture   Final    NO GROWTH 5 DAYS Performed at [facility], [place] [address], [place], [place] [address]    Report Status 08/21/2018 FINAL  Final  Culture, blood (Routine X 2) w Reflex to ID Panel     Status: None   Collection Time: 08/16/18  3:44 PM  Result Value Ref Range Status    Specimen Description BLOOD LEFT HAND  Final   Special Requests AEROBIC BOTTLE ONLY Blood Culture adequate volume  Final   Culture   Final    NO GROWTH 5 DAYS Performed at [facility], [name] [address], [place], [place] [address]    Report Status 08/21/2018 FINAL  Final  MRSA PCR Screening     Status: None   Collection Time: 08/16/18  4:53 PM  Result Value Ref Range Status   MRSA by PCR NEGATIVE NEGATIVE Final    Comment:        The GeneXpert MRSA Assay (FDA approved for NASAL specimens only), is one component of a comprehensive MRSA colonization surveillance program. It is not intended to diagnose MRSA infection nor to guide or monitor treatment for MRSA infections. Performed at [facility], [place] [address] [place], [place] [address]   Urine Culture     Status: Abnormal   Collection Time: 08/16/18  8:37 PM  Result Value Ref Range Status   Specimen Description URINE, CLEAN CATCH  Final   Special Requests NONE  Final   Culture (A)  Final    <10,000 COLONIES/mL INSIGNIFICANT GROWTH Performed at [facility], [place] [address] [place], [place] [address]    Report Status 08/18/2018 FINAL  Final      Radiology Studies: Dg Ercp Biliary & Pancreatic Ducts  Result Date: 08/21/2018 CLINICAL DATA:  Balloon stone retrieval EXAM: ERCP TECHNIQUE: Multiple spot images obtained with the fluoroscopic device and submitted for interpretation post-procedure. FLUOROSCOPY TIME:  Fluoroscopy Time:  9 minutes and 18 seconds Radiation Exposure Index (if provided by the fluoroscopic device): Number of Acquired Spot Images: 0 COMPARISON:  None. FINDINGS: Initial imaging demonstrates several filling defects in the common bile duct. Balloon stone retrieval is documented. The final image demonstrates clearance of the filling defects. IMPRESSION: See above. These images were submitted for radiologic interpretation only. Please see the procedural report for the amount of contrast and the  fluoroscopy time utilized. Electronically Signed   By: Marybelle Killings M.D.   On: 08/21/2018 11:32      Scheduled Meds:  furosemide  40 mg Intravenous BID   magnesium chloride  2 tablet Oral Daily   mouth rinse  15 mL Mouth Rinse BID   metoprolol tartrate  25 mg Oral Q8H   pantoprazole  40 mg Oral Daily   rosuvastatin  20 mg Oral Daily   sodium chloride flush  5 mL Intracatheter Q8H   Continuous Infusions:  sodium chloride 10 mL/hr at 08/19/18 1100   amiodarone 30 mg/hr (08/22/18 1007)   piperacillin-tazobactam (ZOSYN)  IV 3.375 g (08/22/18 1403)     LOS: 7 days    Time spent: Total of 25 minutes spent with pt, greater than 50% of which was spent in discussion of  treatment, counseling and coordination of care    Chipper Oman, MD  How to contact the [facility] Attending or Consulting provider [place] or covering provider during after hours [place], for this patient?  1. Check the care team in [facility] and look for a) attending/consulting [facility] provider listed and b) the [facility] team listed 2. Log into [email or web address] and use [place]'s universal password to access. If you do not have the password, please contact the hospital operator. 3. Locate the [facility] provider you are looking for under [facility] and page to a number that you can be directly reached. 4. If you still have difficulty reaching the provider, please page the [facility] (Director on Call) for the Hospitalists listed on amion for assistance.

## 2018-08-22 NOTE — Progress Notes (Signed)
Referring Physician(s): Lama,G/Connor,C  Supervising Physician: Arne Cleveland  Patient Status:  [facility] - In-pt  Chief Complaint: Abdominal pain   Subjective: Pt doing ok today; no acute c/o; s/p ERCP yesterday with stone extraction/ biliary stent placement   Allergies: Metronidazole and Penicillin g  Medications: Prior to Admission medications   Medication Sig Start Date End Date Taking? Authorizing Provider  carvedilol (COREG) 3.125 MG tablet Take 1 tablet (3.125 mg total) by mouth 2 (two) times daily with a meal. 08/04/18  Yes Gollan, Kathlene November, MD  flecainide (TAMBOCOR) 50 MG tablet Take 1 tablet (50 mg total) by mouth 2 (two) times daily. 08/04/18  Yes Gollan, Kathlene November, MD  magnesium chloride (SLOW-MAG) 64 MG TBEC SR tablet Take 2 tablets (128 mg total) by mouth daily. 12/18/17  Yes [name], MD  omeprazole (PRILOSEC) 40 MG capsule Take 1 capsule (40 mg total) by mouth daily. 05/13/18 08/17/18 Yes [name], MD  rosuvastatin (CRESTOR) 20 MG tablet Take 1 tablet (20 mg total) by mouth daily. 08/04/18  Yes Minna Merritts, MD  triamcinolone ointment (KENALOG) 0.1 % Apply 1 application topically 2 (two) times daily as needed (rash).  07/29/18  Yes [provider]     Vital Signs: BP 103/73 (BP Location: Right Arm)    Pulse 93    Temp (!) 97.5 F (36.4 C) (Axillary)    Resp (!) 22    Ht 4' 9.01" (1.448 m)    Wt 128 lb 4.9 oz (58.2 kg)    SpO2 97%    BMI 27.76 kg/m   Physical Exam RUQ drain intact, insertion site ok, not sig tender, output 250 cc green bile  Imaging: Dg Ercp Biliary & Pancreatic Ducts  Result Date: 08/21/2018 CLINICAL DATA:  Balloon stone retrieval EXAM: ERCP TECHNIQUE: Multiple spot images obtained with the fluoroscopic device and submitted for interpretation post-procedure. FLUOROSCOPY TIME:  Fluoroscopy Time:  9 minutes and 18 seconds Radiation Exposure Index (if provided by the fluoroscopic device): Number of Acquired Spot Images:  0 COMPARISON:  None. FINDINGS: Initial imaging demonstrates several filling defects in the common bile duct. Balloon stone retrieval is documented. The final image demonstrates clearance of the filling defects. IMPRESSION: See above. These images were submitted for radiologic interpretation only. Please see the procedural report for the amount of contrast and the fluoroscopy time utilized. Electronically Signed   By: Marybelle Killings M.D.   On: 08/21/2018 11:32   Ir Cholangiogram Existing Tube  Result Date: 08/18/2018 INDICATION: Biliary ductal dilatation status post decompressive cholecystostomy catheter placement. EXAM: CHOLANGIOGRAM THROUGH EXISTING CATHETER MEDICATIONS: None indicated ANESTHESIA/SEDATION: None required FLUOROSCOPY TIME:  42 seconds; 5 mGy COMPLICATIONS: None immediate. PROCEDURE: Informed written consent was obtained from the patient and family after a thorough discussion of the procedural risks, benefits and alternatives. All questions were addressed. Maximal Sterile Barrier Technique was utilized including caps, mask, sterile gowns, sterile gloves, sterile drape, hand hygiene and skin antiseptic. A timeout was performed prior to the initiation of the procedure. Scout radiograph shows stable position of the cholecystostomy catheter. Contrast injection shows normal filling of the gallbladder. Cystic duct is patent. No filling defects in the CBD. There is a short segment eccentric shelf-like area of high-grade stenosis in the distal CBD just proximal to the ampulla. A small amount of contrast traverses this area into the duodenum. Partial opacification of the pancreatic duct in the head of the pancreas, which is nondilated. IMPRESSION: 1. High-grade short-segment shelf-like stenosis in the  distal CBD. Consider endoscopic evaluation and brush biopsy to exclude neoplasm. Electronically Signed   By: Lucrezia Europe M.D.   On: 08/18/2018 16:49    Labs:  CBC: Recent Labs    08/16/18 0938  08/17/18 0324 08/19/18 0310 08/22/18 0312  WBC 14.8* 13.7* 8.8 8.9  HGB 10.0* 10.3* 11.3* 10.6*  HCT 31.2* 33.2* 33.1* 32.6*  PLT 204 217 288 301    COAGS: Recent Labs    07/05/18 1655 07/07/18 0533 08/15/18 2040  INR 1.09  --  1.3*  APTT  --  113*  --     BMP: Recent Labs    08/16/18 2038 08/17/18 0324 08/19/18 0310 08/22/18 0312  NA 135 137 135 132*  K 3.8 4.1 3.8 4.2  CL 109 114* 109 104  CO2 20* 15* 20* 19*  GLUCOSE 77 75 99 139*  BUN 24* 26* 16 14  CALCIUM 7.6* 7.6* 8.6* 8.6*  CREATININE 1.23* 1.20* 0.97 1.27*  GFRNONAA 38* 39* 50* 36*  GFRAA 43* 45* 58* 42*    LIVER FUNCTION TESTS: Recent Labs    08/15/18 2040 08/16/18 0938 08/17/18 0324 08/19/18 0310  BILITOT 3.6* 2.6* 1.6* 1.2  AST 180* 106* 68* 22  ALT 153* 117* 84* 46*  ALKPHOS 136* 128* 114 93  PROT 5.6* 5.2* 5.2* 5.2*  ALBUMIN 2.7* 2.5* 2.2* 2.2*    Assessment and Plan: Pt with hx cholecystitis/choledocholithiasis, s/p ERCP with sphincterotomy, stone extraction, panc stent yesterday; s/p perc cholecystostomy 3/17; afebrile; creat 1.27- hydrate; WBC nl; hgb 10.6(11.3); cont GB drain flushes/output recording; drain will need to remain in place at least 4-6 weeks unless cholecystectomy done in interim ; otherwise pt will need f/u cholangiogram in 4 weeks   Electronically Signed: D. Rowe Robert, PA-C 08/22/2018, 9:45 AM   I spent a total of 15 minutes at the the patient's bedside AND on the patient's hospital floor or unit, greater than 50% of which was counseling/coordinating care for gallbladder drain    Patient ID: Kylie May, female   DOB: 16-May-1922, 85 y.o.   MRN: [id number]

## 2018-08-23 LAB — BASIC METABOLIC PANEL
Anion gap: 10 (ref 5–15)
BUN: 19 mg/dL (ref 8–23)
CO2: 20 mmol/L — ABNORMAL LOW (ref 22–32)
Calcium: 8.5 mg/dL — ABNORMAL LOW (ref 8.9–10.3)
Chloride: 104 mmol/L (ref 98–111)
Creatinine, Ser: 1.55 mg/dL — ABNORMAL HIGH (ref 0.44–1.00)
GFR calc Af Amer: 33 mL/min — ABNORMAL LOW (ref >60)
GFR, EST NON AFRICAN AMERICAN: 28 mL/min — AB (ref >60)
Glucose, Bld: 104 mg/dL — ABNORMAL HIGH (ref 70–99)
Potassium: 3.7 mmol/L (ref 3.5–5.1)
Sodium: 134 mmol/L — ABNORMAL LOW (ref 135–145)

## 2018-08-23 LAB — CBC WITH DIFFERENTIAL/PLATELET
Abs Immature Granulocytes: 0.11 10*3/uL — ABNORMAL HIGH (ref 0.00–0.07)
Basophils Absolute: 0 10*3/uL (ref 0.0–0.1)
Basophils Relative: 0 %
Eosinophils Absolute: 0.2 10*3/uL (ref 0.0–0.5)
Eosinophils Relative: 2 %
HCT: 32.8 % — ABNORMAL LOW (ref 36.0–46.0)
Hemoglobin: 10.6 g/dL — ABNORMAL LOW (ref 12.0–15.0)
Immature Granulocytes: 1 %
LYMPHS ABS: 2.6 10*3/uL (ref 0.7–4.0)
Lymphocytes Relative: 26 %
MCH: 27.6 pg (ref 26.0–34.0)
MCHC: 32.3 g/dL (ref 30.0–36.0)
MCV: 85.4 fL (ref 80.0–100.0)
Monocytes Absolute: 1.2 10*3/uL — ABNORMAL HIGH (ref 0.1–1.0)
Monocytes Relative: 12 %
NRBC: 0 % (ref 0.0–0.2)
Neutro Abs: 6.1 10*3/uL (ref 1.7–7.7)
Neutrophils Relative %: 59 %
Platelets: 320 10*3/uL (ref 150–400)
RBC: 3.84 MIL/uL — ABNORMAL LOW (ref 3.87–5.11)
RDW: 13.6 % (ref 11.5–15.5)
WBC: 10.2 10*3/uL (ref 4.0–10.5)

## 2018-08-23 LAB — MAGNESIUM: Magnesium: 1.6 mg/dL — ABNORMAL LOW (ref 1.7–2.4)

## 2018-08-23 MED ORDER — AMOXICILLIN-POT CLAVULANATE 500-125 MG PO TABS
1.0000 | ORAL_TABLET | Freq: Two times a day (BID) | ORAL | Status: DC
  Administered 2018-08-23 – 2018-08-26 (×6): 500 mg via ORAL
  Filled 2018-08-23 (×7): qty 1

## 2018-08-23 MED ORDER — AMOXICILLIN-POT CLAVULANATE 875-125 MG PO TABS: 1.0000 | ORAL_TABLET | Freq: Two times a day (BID) | ORAL | Status: DC

## 2018-08-23 MED ORDER — FUROSEMIDE 40 MG PO TABS
40.0000 mg | ORAL_TABLET | Freq: Every day | ORAL | Status: DC
  Administered 2018-08-24 – 2018-08-26 (×3): 40 mg via ORAL
  Filled 2018-08-23 (×3): qty 1

## 2018-08-23 MED ORDER — PIPERACILLIN-TAZOBACTAM IN DEX 2-0.25 GM/50ML IV SOLN
2.2500 g | Freq: Three times a day (TID) | INTRAVENOUS | Status: DC
  Administered 2018-08-23: 2.25 g via INTRAVENOUS
  Filled 2018-08-23 (×2): qty 50

## 2018-08-23 MED ORDER — APIXABAN 2.5 MG PO TABS
2.5000 mg | ORAL_TABLET | Freq: Two times a day (BID) | ORAL | Status: DC
  Administered 2018-08-23 – 2018-08-26 (×7): 2.5 mg via ORAL
  Filled 2018-08-23 (×7): qty 1

## 2018-08-23 MED ORDER — AMIODARONE HCL 200 MG PO TABS
200.0000 mg | ORAL_TABLET | Freq: Two times a day (BID) | ORAL | Status: DC
  Administered 2018-08-23 – 2018-08-26 (×6): 200 mg via ORAL
  Filled 2018-08-23 (×7): qty 1

## 2018-08-23 NOTE — Anesthesia Postprocedure Evaluation (Signed)
Anesthesia Post Note  Patient: Kylie May  Procedure(s) Performed: ENDOSCOPIC RETROGRADE CHOLANGIOPANCREATOGRAPHY (ERCP) (N/A ) REMOVAL OF STONES SPHINCTEROTOMY BILIARY STENT PLACEMENT     Patient location during evaluation: Endoscopy Anesthesia Type: General Level of consciousness: awake and alert Pain management: pain level controlled Vital Signs Assessment: post-procedure vital signs reviewed and stable Respiratory status: spontaneous breathing, nonlabored ventilation, respiratory function stable and patient connected to nasal cannula oxygen Cardiovascular status: blood pressure returned to baseline and stable Postop Assessment: no apparent nausea or vomiting Anesthetic complications: no    Last Vitals:  Vitals:   08/23/18 1432 08/23/18 1433  BP: 97/67   Pulse: (!) 103   Resp: (!) 22 (!) 21  Temp: (!) 36.4 C (!) 36.4 C  SpO2: 94%     Last Pain:  Vitals:   08/23/18 1433  TempSrc: Oral  PainSc:                  Annabell Oconnor L Lynsey Ange

## 2018-08-23 NOTE — Progress Notes (Signed)
PROGRESS NOTE    Kylie May  ONG:[id number] DOB: [birth year] DOA: 08/15/2018 PCP: [name], MD    Brief Narrative:  85 year old female who presented with severe epigastric pain radiated to her back.  She does have significant past medical history for atrial fibrillation, chronic diastolic heart failure, mitral valve prolapse, dyslipidemia and esophageal strictures.  She was initially admitted to [facility], her work-up included a CT of her abdomen which showed intra-and extrahepatic duct dilatation and gallbladder wall thickening but no stones.  Patient was transferred to [facility] for ERCP.   Patient underwent cholecystostomy tube placement by interventional radiology, ERCP showed dilation of the bile duct, cholelithiasis.  Patient had stone removed, biliary sphincterectomy performed and a plastic stent was placed into the pancreatic duct.   During hospital stay patient developed atrial fibrillation with rapid ventricular response complicated by decompensated heart failure.  Assessment & Plan:   Active Problems:   Hyperlipidemia   Mitral regurgitation   Chronic diastolic CHF (congestive heart failure) (HCC)   Transaminitis   Choledocholithiasis with acute cholecystitis   Atrial fibrillation with RVR (HCC)   Sepsis (HCC)   Septic shock (HCC)   Goals of care, counseling/discussion   1. Choledocholithiasis complicated with cholecystitis and septic shock sp cholecystostomy tube. No further abdominal pain, patient has remained afebrile, and no significant leukocytosis. No plans for cholecystectomy on this admission. Will change Zosyn to oral Augmentin plan for 10 days total antibiotic therapy. Tolerating po well.   2. Chronic atrial fibrillation complicated with RVR and acute decompensation of diastolic heart failure. Will continue rate control with metoprolol continue telemetry monitoring. Heart rate in the low 100's, will target a  more light control due to risk of side effects with more strict rate control. Improved volume status. Will continue follow cardiology recommendations. Continue anticoagulation with apixaban  3. AKI. Worsening renal function with serum cr at 1,55 with K at 3,7 and serum bicarbonate at 20, will continue to hold on furosemide, will follow on renal panel in am, avoid hypotension and nephrotoxic medications.   DVT prophylaxis: apixaban   Code Status:  full Family Communication: I spoke with patient's family at the bedside and all questions were addressed.  Disposition Plan/ discharge barriers:  Pending renal function improvement, possible dc in am, to home, her family declined SNF option.   Body mass index is 27.75 kg/m. Malnutrition Type:      Malnutrition Characteristics:      Nutrition Interventions:     RN Pressure Injury Documentation:     Consultants:   IR  GI  Cardiology   Surgery   Procedures:   cholecystostomy tube  ERCP with stone removal and pancreatic duct stent placement.   Antimicrobials:   Augmentin     Subjective: Patient has been feeling better, still not back to her baseline, continue to have episodic tachycardia, but no dyspnea, no nausea or vomiting. Has been getting out of bed.   Objective: Vitals:   08/22/18 2030 08/22/18 2207 08/23/18 0116 08/23/18 0900  BP: (!) 88/56 101/62 116/82   Pulse: (!) 122 (!) 113 (!) 106   Resp: 20     Temp: (!) 97.3 F (36.3 C)     TempSrc: Oral     SpO2: 100%     Weight:    58.2 kg  Height:        Intake/Output Summary (Last 24 hours) at 08/23/2018 1156 Last data filed at 08/23/2018 1100 Gross per 24 hour  Intake 725.05 ml  Output 725 ml  Net 0.05 ml   Filed Weights   08/21/18 0801 08/22/18 0605 08/23/18 0900  Weight: 56.9 kg 58.2 kg 58.2 kg    Examination:   General: deconditioned  Neurology: Awake and alert, non focal  E ENT: mild pallor, no icterus, oral mucosa moist Cardiovascular:  No JVD. S1-S2 present, rhythmic, no gallops, rubs, or murmurs. No lower extremity edema. Pulmonary: positive breath sounds bilaterally, adequate air movement, no wheezing, rhonchi or rales. Gastrointestinal. Abdomen with no organomegaly, non tender, no rebound or guarding/ cholecystostomy tube in place.  Skin. No rashes Musculoskeletal: no joint deformities     Data Reviewed: I have personally reviewed following labs and imaging studies  CBC: Recent Labs  Lab 08/17/18 0324 08/19/18 0310 08/22/18 0312 08/23/18 0404  WBC 13.7* 8.8 8.9 10.2  NEUTROABS 11.3*  --   --  6.1  HGB 10.3* 11.3* 10.6* 10.6*  HCT 33.2* 33.1* 32.6* 32.8*  MCV 91.2 84.9 85.1 85.4  PLT 217 288 301 202   Basic Metabolic Panel: Recent Labs  Lab 08/16/18 2038 08/17/18 0324 08/18/18 0342 08/19/18 0310 08/22/18 0312 08/23/18 0404  NA 135 137  --  135 132* 134*  K 3.8 4.1  --  3.8 4.2 3.7  CL 109 114*  --  109 104 104  CO2 20* 15*  --  20* 19* 20*  GLUCOSE 77 75  --  99 139* 104*  BUN 24* 26*  --  16 14 19   CREATININE 1.23* 1.20*  --  0.97 1.27* 1.55*  CALCIUM 7.6* 7.6*  --  8.6* 8.6* 8.5*  MG 1.9 1.9 2.0 1.7  --  1.6*   GFR: Estimated Creatinine Clearance: 16.3 mL/min (A) (by C-G formula based on SCr of 1.55 mg/dL (H)). Liver Function Tests: Recent Labs  Lab 08/17/18 0324 08/19/18 0310  AST 68* 22  ALT 84* 46*  ALKPHOS 114 93  BILITOT 1.6* 1.2  PROT 5.2* 5.2*  ALBUMIN 2.2* 2.2*   No results for input(s): LIPASE, AMYLASE in the last 168 hours. No results for input(s): AMMONIA in the last 168 hours. Coagulation Profile: No results for input(s): INR, PROTIME in the last 168 hours. Cardiac Enzymes: No results for input(s): CKTOTAL, CKMB, CKMBINDEX, TROPONINI in the last 168 hours. BNP (last 3 results) No results for input(s): PROBNP in the last 8760 hours. HbA1C: No results for input(s): HGBA1C in the last 72 hours. CBG: Recent Labs  Lab 08/16/18 1610  GLUCAP 76   Lipid Profile: No  results for input(s): CHOL, HDL, LDLCALC, TRIG, CHOLHDL, LDLDIRECT in the last 72 hours. Thyroid Function Tests: No results for input(s): TSH, T4TOTAL, FREET4, T3FREE, THYROIDAB in the last 72 hours. Anemia Panel: No results for input(s): VITAMINB12, FOLATE, FERRITIN, TIBC, IRON, RETICCTPCT in the last 72 hours.    Radiology Studies: I have reviewed all of the imaging during this hospital visit personally     Scheduled Meds: . apixaban  2.5 mg Oral BID  . furosemide  40 mg Oral Daily  . magnesium chloride  2 tablet Oral Daily  . mouth rinse  15 mL Mouth Rinse BID  . metoprolol tartrate  25 mg Oral Q8H  . pantoprazole  40 mg Oral Daily  . rosuvastatin  20 mg Oral Daily  . sodium chloride flush  5 mL Intracatheter Q8H   Continuous Infusions: . sodium chloride 10 mL/hr at 08/19/18 1100  . piperacillin-tazobactam (ZOSYN)  IV       LOS: 8  days        Mauricio Gerome Apley, MD

## 2018-08-23 NOTE — Progress Notes (Signed)
Progress Note  Patient Name: Kylie May Date of Encounter: 08/23/2018  Primary Cardiologist: Ida Rogue, MD   Subjective   Seems quite comfortable, smiling. Clearly had markedly increased urine output according to the patient and his family, but this is not fully recorded. Weight has not really changed Atrial fibrillation rate control is markedly improved, now with ventricular rates in the high 80s-90s when relaxed, 100s when more animated. Pressure remains low, but no symptoms of hypotension. Creatinine has increased to 1.5 following administration of IV diuretics.  Inpatient Medications    Scheduled Meds: . apixaban  2.5 mg Oral BID  . furosemide  40 mg Oral Daily  . magnesium chloride  2 tablet Oral Daily  . mouth rinse  15 mL Mouth Rinse BID  . metoprolol tartrate  25 mg Oral Q8H  . pantoprazole  40 mg Oral Daily  . rosuvastatin  20 mg Oral Daily  . sodium chloride flush  5 mL Intracatheter Q8H   Continuous Infusions: . sodium chloride 10 mL/hr at 08/19/18 1100  . piperacillin-tazobactam (ZOSYN)  IV 12.5 mL/hr at 08/23/18 0932   PRN Meds: acetaminophen, diphenhydrAMINE, ondansetron (ZOFRAN) IV, oxyCODONE, triamcinolone ointment, white petrolatum   Vital Signs    Vitals:   08/22/18 1530 08/22/18 2030 08/22/18 2207 08/23/18 0116  BP:  (!) 88/56 101/62 116/82  Pulse: (!) 112 (!) 122 (!) 113 (!) 106  Resp: (!) 26 20    Temp: (!) 97.5 F (36.4 C) (!) 97.3 F (36.3 C)    TempSrc: Oral Oral    SpO2:  100%    Weight:      Height:        Intake/Output Summary (Last 24 hours) at 08/23/2018 0953 Last data filed at 08/23/2018 0932 Gross per 24 hour  Intake 659.46 ml  Output 700 ml  Net -40.54 ml   Last 3 Weights 08/22/2018 08/21/2018 08/21/2018  Weight (lbs) 128 lb 4.9 oz 125 lb 7.1 oz 125 lb 8 oz  Weight (kg) 58.2 kg 56.9 kg 56.926 kg      Telemetry    Atrial fibrillation with borderline ventricular rate control- Personally Reviewed  ECG    No  new tracing- Personally Reviewed  Physical Exam  Elderly, frail but smiling and appears comfortable GEN: No acute distress.   Neck: 7-8 cm JVD Cardiac:  Irregular, no murmurs, rubs, or gallops.  Respiratory: Clear to auscultation bilaterally. GI: Soft, nontender, non-distended  MS: Soft edema, some wrinkle formation; No deformity. Neuro:  Nonfocal  Psych: Normal affect   Labs    Chemistry Recent Labs  Lab 08/17/18 0324 08/19/18 0310 08/22/18 0312 08/23/18 0404  NA 137 135 132* 134*  K 4.1 3.8 4.2 3.7  CL 114* 109 104 104  CO2 15* 20* 19* 20*  GLUCOSE 75 99 139* 104*  BUN 26* 16 14 19   CREATININE 1.20* 0.97 1.27* 1.55*  CALCIUM 7.6* 8.6* 8.6* 8.5*  PROT 5.2* 5.2*  --   --   ALBUMIN 2.2* 2.2*  --   --   AST 68* 22  --   --   ALT 84* 46*  --   --   ALKPHOS 114 93  --   --   BILITOT 1.6* 1.2  --   --   GFRNONAA 39* 50* 36* 28*  GFRAA 45* 58* 42* 33*  ANIONGAP 8 6 9 10      Hematology Recent Labs  Lab 08/19/18 0310 08/22/18 0312 08/23/18 0404  WBC 8.8 8.9 10.2  RBC 3.90 3.83* 3.84*  HGB 11.3* 10.6* 10.6*  HCT 33.1* 32.6* 32.8*  MCV 84.9 85.1 85.4  MCH 29.0 27.7 27.6  MCHC 34.1 32.5 32.3  RDW 13.3 13.5 13.6  PLT 288 301 320    Cardiac EnzymesNo results for input(s): TROPONINI in the last 168 hours. No results for input(s): TROPIPOC in the last 168 hours.   BNPNo results for input(s): BNP, PROBNP in the last 168 hours.   DDimer No results for input(s): DDIMER in the last 168 hours.   Radiology    Dg Ercp Biliary & Pancreatic Ducts  Result Date: 08/21/2018 CLINICAL DATA:  Balloon stone retrieval EXAM: ERCP TECHNIQUE: Multiple spot images obtained with the fluoroscopic device and submitted for interpretation post-procedure. FLUOROSCOPY TIME:  Fluoroscopy Time:  9 minutes and 18 seconds Radiation Exposure Index (if provided by the fluoroscopic device): Number of Acquired Spot Images: 0 COMPARISON:  None. FINDINGS: Initial imaging demonstrates several filling  defects in the common bile duct. Balloon stone retrieval is documented. The final image demonstrates clearance of the filling defects. IMPRESSION: See above. These images were submitted for radiologic interpretation only. Please see the procedural report for the amount of contrast and the fluoroscopy time utilized. Electronically Signed   By: Marybelle Killings M.D.   On: 08/21/2018 11:32    Cardiac Studies   Echocardiogram 08/17/2018 IMPRESSIONS 1. The left ventricle has normal systolic function, with an ejection fraction of 60-65%. The cavity size was normal. Left ventricular diastolic function could not be evaluated secondary to atrial fibrillation. No evidence of left ventricular regional  wall motion abnormalities. 2. The right ventricle has normal systolc function. The cavity was normal. There is no increase in right ventricular wall thickness. Right ventricular systolic pressure is mildly elevated with an estimated pressure of 43.5 mmHg. 3. Left atrial size was moderately dilated. 4. Right atrial size was moderately dilated. 5. Small pericardial effusion. 6. The pericardial effusion is circumferential. 7. The aortic valve is tricuspid Mild thickening of the aortic valve Mild calcification of the aortic valve. 8. The inferior vena cava was dilated in size with <50% respiratory variability.  Patient Profile     85 y.o. female with a hx of paroxysmal asymptomaticatrial fibrillationon Eliquis,mild diastolic CHF,HTN, hiatal hernia, osteopenia, degenerative joint disease, scoliosis, hearing lossadmitted with acute cholecystitis/ choledocholithiasis, complicated by persistentatrial fibrillationwith RVR, difficult to rate control due to low BP. Status post cholecystostomy drain on arrival, ERCP with stone extraction on 08/21/2018.  Assessment & Plan    1. AFib: Right control has improved, although it is far from ideal, it is acceptable.  No plans for invasive procedures in the  immediate future and no evidence of bleeding.  Restart Eliquis.  Switch amiodarone to p.o. 2. CHF: Worsening renal function with attempts at more aggressive diuresis.  Will place on a lower dose of oral furosemide daily.  She is not in respiratory distress and as long as we maintain a slow negative fluid balance, there is no rush to diurese aggressively.     For questions or updates, please contact [place] Please consult [email or web address] for contact info under        Signed, Sanda Klein, MD  08/23/2018, 9:53 AM

## 2018-08-23 NOTE — Progress Notes (Signed)
PHARMACY NOTE:  ANTIMICROBIAL RENAL DOSAGE ADJUSTMENT  Current antimicrobial regimen includes a mismatch between antimicrobial dosage and estimated renal function.  As per policy approved by the Pharmacy & Therapeutics and Medical Executive Committees, the antimicrobial dosage will be adjusted accordingly.  Current antimicrobial dosage:  Zosyn 3.375g IV EI q8h  Indication: intraabdominal infection  Renal Function:  Estimated Creatinine Clearance: 16.3 mL/min (A) (by C-G formula based on SCr of 1.55 mg/dL (H)). []      On intermittent HD, scheduled: []      On CRRT    Antimicrobial dosage has been changed to:  Zosyn 2.25g IV q8h    Thank you for allowing pharmacy to be a part of this patient's care.   Arrie Senate, PharmD, BCPS Clinical Pharmacist [phone] Please check AMION for all [place] numbers 08/23/2018

## 2018-08-24 LAB — CBC WITH DIFFERENTIAL/PLATELET
Abs Immature Granulocytes: 0.08 10*3/uL — ABNORMAL HIGH (ref 0.00–0.07)
Basophils Absolute: 0.1 10*3/uL (ref 0.0–0.1)
Basophils Relative: 1 %
EOS ABS: 0.3 10*3/uL (ref 0.0–0.5)
Eosinophils Relative: 3 %
HEMATOCRIT: 32.9 % — AB (ref 36.0–46.0)
Hemoglobin: 10.7 g/dL — ABNORMAL LOW (ref 12.0–15.0)
Immature Granulocytes: 1 %
LYMPHS ABS: 2.2 10*3/uL (ref 0.7–4.0)
Lymphocytes Relative: 22 %
MCH: 27.9 pg (ref 26.0–34.0)
MCHC: 32.5 g/dL (ref 30.0–36.0)
MCV: 85.7 fL (ref 80.0–100.0)
Monocytes Absolute: 1.1 10*3/uL — ABNORMAL HIGH (ref 0.1–1.0)
Monocytes Relative: 11 %
Neutro Abs: 6.5 10*3/uL (ref 1.7–7.7)
Neutrophils Relative %: 62 %
Platelets: 303 10*3/uL (ref 150–400)
RBC: 3.84 MIL/uL — ABNORMAL LOW (ref 3.87–5.11)
RDW: 13.6 % (ref 11.5–15.5)
WBC: 10.2 10*3/uL (ref 4.0–10.5)
nRBC: 0 % (ref 0.0–0.2)

## 2018-08-24 LAB — BASIC METABOLIC PANEL
Anion gap: 11 (ref 5–15)
BUN: 23 mg/dL (ref 8–23)
CO2: 22 mmol/L (ref 22–32)
Calcium: 8.7 mg/dL — ABNORMAL LOW (ref 8.9–10.3)
Chloride: 104 mmol/L (ref 98–111)
Creatinine, Ser: 1.48 mg/dL — ABNORMAL HIGH (ref 0.44–1.00)
GFR calc Af Amer: 35 mL/min — ABNORMAL LOW (ref >60)
GFR calc non Af Amer: 30 mL/min — ABNORMAL LOW (ref >60)
Glucose, Bld: 91 mg/dL (ref 70–99)
Potassium: 3.5 mmol/L (ref 3.5–5.1)
Sodium: 137 mmol/L (ref 135–145)

## 2018-08-24 NOTE — Progress Notes (Signed)
Subjective No acute events. Feeling well. Denies abdominal pain. Tolerating diet without n/v.  Objective: Vital signs in last 24 hours: Temp:  [97.3 F (36.3 C)-97.5 F (36.4 C)] 97.3 F (36.3 C) (03/23 0657) Pulse Rate:  [90-103] 90 (03/23 0657) Resp:  [21-22] 21 (03/23 0657) BP: (97-110)/(62-67) 105/62 (03/23 0657) SpO2:  [94 %] 94 % (03/23 0657) Weight:  [57 kg-58.2 kg] 57 kg (03/23 0817) Last BM Date: 08/23/18  Intake/Output from previous day: 03/22 0701 - 03/23 0700 In: 192.6 [I.V.:56.8; IV Piggyback:135.9] Out: 425 [Urine:400; Drains:25] Intake/Output this shift: No intake/output data recorded.  Gen: NAD, comfortable CV: RRR Pulm: Normal work of breathing Abd: Soft, NT/ND; perc chole drain in place with bilious sludge in bag as expected Ext: SCDs in place  Lab Results: CBC  Recent Labs    08/23/18 0404 08/24/18 0400  WBC 10.2 10.2  HGB 10.6* 10.7*  HCT 32.8* 32.9*  PLT 320 303   BMET Recent Labs    08/23/18 0404 08/24/18 0400  NA 134* 137  K 3.7 3.5  CL 104 104  CO2 20* 22  GLUCOSE 104* 91  BUN 19 23  CREATININE 1.55* 1.48*  CALCIUM 8.5* 8.7*   PT/INR No results for input(s): LABPROT, INR in the last 72 hours. ABG No results for input(s): PHART, HCO3 in the last 72 hours.  Invalid input(s): PCO2, PO2  Studies/Results:  Anti-infectives: Anti-infectives (From admission, onward)   Start     Dose/Rate Route Frequency Ordered Stop   08/23/18 2200  amoxicillin-clavulanate (AUGMENTIN) 875-125 MG per tablet 1 tablet  Status:  Discontinued     1 tablet Oral Every 12 hours 08/23/18 1600 08/23/18 1601   08/23/18 2200  amoxicillin-clavulanate (AUGMENTIN) 500-125 MG per tablet 500 mg     1 tablet Oral Every 12 hours 08/23/18 1601     08/23/18 1400  piperacillin-tazobactam (ZOSYN) IVPB 2.25 g  Status:  Discontinued     2.25 g 100 mL/hr over 30 Minutes Intravenous Every 8 hours 08/23/18 1101 08/23/18 1600   08/16/18 1740  vancomycin (VANCOCIN) 1-5  GM/200ML-% IVPB  Status:  Discontinued    Note to Pharmacy:  Desiree Hane   : cabinet override      08/16/18 1740 08/16/18 1824   08/15/18 2200  cefTRIAXone (ROCEPHIN) 2 g in sodium chloride 0.9 % 100 mL IVPB  Status:  Discontinued     2 g 200 mL/hr over 30 Minutes Intravenous Every 24 hours 08/15/18 1957 08/15/18 2005   08/15/18 2200  piperacillin-tazobactam (ZOSYN) IVPB 3.375 g  Status:  Discontinued     3.375 g 12.5 mL/hr over 240 Minutes Intravenous Every 8 hours 08/15/18 2036 08/23/18 1101   08/15/18 2000  metroNIDAZOLE (FLAGYL) IVPB 500 mg  Status:  Discontinued     500 mg 100 mL/hr over 60 Minutes Intravenous Every 8 hours 08/15/18 1957 08/15/18 2005       Assessment/Plan: Patient Active Problem List   Diagnosis Date Noted  . Septic shock ([place])   . Goals of care, counseling/discussion   . RUQ pain 08/15/2018  . Transaminitis 08/15/2018  . Choledocholithiasis with acute cholecystitis 08/15/2018  . Atrial fibrillation with RVR ([place]) 08/15/2018  . Sepsis ([place]) 08/15/2018  . Anemia 07/13/2018  . Right upper quadrant abdominal pain 07/05/2018  . Patella fracture 05/13/2018  . Tooth injury 11/05/2017  . Fall 10/31/2017  . Abdominal aortic atherosclerosis ([place]) 09/16/2017  . Rib pain 09/04/2017  . Rash 09/04/2017  . Elevated TSH 09/04/2017  . Weight loss  07/16/2017  . Left ankle pain 07/16/2017  . Chronic back pain 01/14/2017  . Scoliosis 10/14/2016  . Frequent falls 04/18/2016  . A-fib ([place])   . Mitral regurgitation   . Chronic diastolic CHF (congestive heart failure) ([place])   . Hearing loss 01/09/2010  . History of skin cancer 12/29/2008  . Hyperlipidemia 08/10/2007  . GERD (gastroesophageal reflux disease) 02/22/2007  . History of esophageal stricture 06/14/2004   s/p Procedure(s): ENDOSCOPIC RETROGRADE CHOLANGIOPANCREATOGRAPHY (ERCP) REMOVAL OF STONES SPHINCTEROTOMY BILIARY STENT PLACEMENT 08/21/2018  -Diet as tolerated -Drain teaching - continue to  gravity -As per Dr. Kae Heller, no plans for surgery this admission - now that she has perc chole drain, recurrent stone passage into CBD is possible but certainly less likely to occur. Given recent events anticipate significant pericholecystic/portal inflammation and ideally would do this surgery down to the road as opposed to this admission particularly in light of a now PTC placed -We will plan to see her in f/u as an outpatient    LOS: 9 days   Sharon Mt. Dema Severin, M.D. [facility]

## 2018-08-24 NOTE — TOC Benefit Eligibility Note (Signed)
Transition of Care [facility]) Benefit Eligibility Note    Patient Details  Name: Kylie May MRN: [id number] Date of Birth: [birth year]   Medication/Dose: Arne Cleveland  2.5 MG BID  AND  ELIQUIS  5 MG BID  Covered?: Yes  Tier: 2 Drug  Prescription Coverage Preferred Pharmacy: YES #  [facility]  Spoke with Person/Company/Phone Number::  JENN @ [employer]  #  [phone]  Co-Pay: $ 33.00  Prior Approval: No  Deductible: Met       Memory Argue Phone Number: 08/24/2018, 2:29 PM

## 2018-08-24 NOTE — TOC Initial Note (Addendum)
Transition of Care [facility]) - Initial/Assessment Note    Patient Details  Name: Kylie May MRN: [id number] Date of Birth: 03/23/1922  Transition of Care [facility]) CM/SW Contact:    Zenon Mayo, RN Phone Number: 08/24/2018, 1:14 PM  Clinical Narrative:                 From home with grandson, per pt eval rec HHPT,  Grandson states she has [facility] services with an agency but can not remember the name will call me back with the agency name.  She will need HHRN for drain (drain daily) RN to show grandson how to do, and HHPT. Will need orders prior to dc.  Also eliquis is new for patient, awaiting benefit check.  She has walker, cane at home, she has PCP, her meds are usually mailed to her , if not she uses [facility] representative.  Grandson phone is [phone].   Expected Discharge Plan: [place] Barriers to Discharge: No Barriers Identified   Patient Goals and CMS Choice Patient states their goals for this hospitalization and ongoing recovery are:: get better CMS Medicare.gov Compare Post Acute Care list provided to:: Patient Represenative (must comment)(grandson) Choice offered to / list presented to : Adult Children  Expected Discharge Plan and Services Expected Discharge Plan: [place] [facility] In-house Referral: NA Discharge Planning Services: CM Consult Post Acute Care Choice: Home Health Living arrangements for the past 2 months: Single Family Home Expected Discharge Date: 08/21/18               DME Arranged: [facility] DME Agency: NA HH Arranged: PT, RN    Prior Living Arrangements/Services Living arrangements for the past 2 months: Single Family Home Lives with:: (grandson)                   Activities of Daily Living Home Assistive Devices/Equipment: None ADL Screening (condition at time of admission) Patient's cognitive ability adequate to safely complete daily activities?: Yes Is the patient deaf or have difficulty hearing?:  Yes Does the patient have difficulty seeing, even when wearing glasses/contacts?: No Does the patient have difficulty concentrating, remembering, or making decisions?: No Patient able to express need for assistance with ADLs?: No Does the patient have difficulty dressing or bathing?: No Independently performs ADLs?: Yes (appropriate for developmental age) Does the patient have difficulty walking or climbing stairs?: No Weakness of Legs: None Weakness of Arms/Hands: None  Permission Sought/Granted                  Emotional Assessment     Affect (typically observed): Appropriate Orientation: : Oriented to Self, Oriented to Place, Oriented to  Time, Oriented to Situation   Psych Involvement: No (comment)  Admission diagnosis:  gallstones Patient Active Problem List   Diagnosis Date Noted  . Septic shock ([place])   . Goals of care, counseling/discussion   . RUQ pain 08/15/2018  . Transaminitis 08/15/2018  . Choledocholithiasis with acute cholecystitis 08/15/2018  . Atrial fibrillation with RVR ([place]) 08/15/2018  . Sepsis ([place]) 08/15/2018  . Anemia 07/13/2018  . Right upper quadrant abdominal pain 07/05/2018  . Patella fracture 05/13/2018  . Tooth injury 11/05/2017  . Fall 10/31/2017  . Abdominal aortic atherosclerosis ([place]) 09/16/2017  . Rib pain 09/04/2017  . Rash 09/04/2017  . Elevated TSH 09/04/2017  . Weight loss 07/16/2017  . Left ankle pain 07/16/2017  . Chronic back pain 01/14/2017  . Scoliosis 10/14/2016  .  Frequent falls 04/18/2016  . A-fib ([place])   . Mitral regurgitation   . Chronic diastolic CHF (congestive heart failure) ([place])   . Hearing loss 01/09/2010  . History of skin cancer 12/29/2008  . Hyperlipidemia 08/10/2007  . GERD (gastroesophageal reflux disease) 02/22/2007  . History of esophageal stricture 06/14/2004   PCP:  [name], MD Pharmacy:   [facility] [place], [place] [address] [place] [address] Phone: [phone] Fax: [phone]  [facility] [place], [place] [address] Phone: [phone] Fax: [phone]  [facility] [place], [place] [address] [place] [address] Phone: [phone] Fax: [phone]     Social Determinants of Health (SDOH) Interventions    Readmission Risk Interventions No flowsheet data found.

## 2018-08-24 NOTE — Progress Notes (Addendum)
Progress Note  Patient Name: Kylie May Date of Encounter: 08/24/2018  Primary Cardiologist: Ida Rogue, MD   Subjective   Very HOH Denies SOB, has been up to BR No chest pain, no awareness of the Afib Grandson in the room lives w/ her, she has good support.   Inpatient Medications    Scheduled Meds: . amiodarone  200 mg Oral BID  . amoxicillin-clavulanate  1 tablet Oral Q12H  . apixaban  2.5 mg Oral BID  . furosemide  40 mg Oral Daily  . magnesium chloride  2 tablet Oral Daily  . mouth rinse  15 mL Mouth Rinse BID  . metoprolol tartrate  25 mg Oral Q8H  . pantoprazole  40 mg Oral Daily  . rosuvastatin  20 mg Oral Daily  . sodium chloride flush  5 mL Intracatheter Q8H   Continuous Infusions: . sodium chloride 10 mL/hr at 08/19/18 1100   PRN Meds: acetaminophen, diphenhydrAMINE, ondansetron (ZOFRAN) IV, oxyCODONE, triamcinolone ointment, white petrolatum   Vital Signs    Vitals:   08/23/18 1432 08/23/18 1433 08/24/18 0530 08/24/18 0657  BP: 97/67  110/62 105/62  Pulse: (!) 103  90 90  Resp: (!) 22 (!) 21  (!) 21  Temp: (!) 97.5 F (36.4 C) (!) 97.5 F (36.4 C)  (!) 97.3 F (36.3 C)  TempSrc: Oral Oral  Oral  SpO2: 94%   94%  Weight:      Height:        Intake/Output Summary (Last 24 hours) at 08/24/2018 0732 Last data filed at 08/23/2018 1100 Gross per 24 hour  Intake 192.63 ml  Output 425 ml  Net -232.37 ml   Last 3 Weights 08/23/2018 08/22/2018 08/21/2018  Weight (lbs) 128 lb 4.8 oz 128 lb 4.9 oz 125 lb 7.1 oz  Weight (kg) 58.196 kg 58.2 kg 56.9 kg      Telemetry    Afib, rate 90s-110s - Personally Reviewed  ECG    No new tracing- Personally Reviewed  Physical Exam   General: Well developed, frail, elderly, female in no acute distress Head: Eyes PERRLA, No xanthomas.   Normocephalic and atraumatic Lungs: diffuse dry rales bilaterally to auscultation. Heart: Irreg R&R S1 S2, without MRG.  Pulses are 2+ & equal. JVD 10 CM Abdomen:  Bowel sounds are present, abdomen soft and non-tender without masses or  hernias noted. Msk: Normal strength and tone for age. Extremities: No clubbing, cyanosis, 1+ edema.    Skin:  No rashes or lesions noted. Neuro: Alert and oriented X 3. Psych:  Good affect, responds appropriately  Labs    Chemistry Recent Labs  Lab 08/19/18 0310 08/22/18 0312 08/23/18 0404 08/24/18 0400  NA 135 132* 134* 137  K 3.8 4.2 3.7 3.5  CL 109 104 104 104  CO2 20* 19* 20* 22  GLUCOSE 99 139* 104* 91  BUN 16 14 19 23   CREATININE 0.97 1.27* 1.55* 1.48*  CALCIUM 8.6* 8.6* 8.5* 8.7*  PROT 5.2*  --   --   --   ALBUMIN 2.2*  --   --   --   AST 22  --   --   --   ALT 46*  --   --   --   ALKPHOS 93  --   --   --   BILITOT 1.2  --   --   --   GFRNONAA 50* 36* 28* 30*  GFRAA 58* 42* 33* 35*  ANIONGAP 6 9 10  11  Hematology Recent Labs  Lab 08/22/18 0312 08/23/18 0404 08/24/18 0400  WBC 8.9 10.2 10.2  RBC 3.83* 3.84* 3.84*  HGB 10.6* 10.6* 10.7*  HCT 32.6* 32.8* 32.9*  MCV 85.1 85.4 85.7  MCH 27.7 27.6 27.9  MCHC 32.5 32.3 32.5  RDW 13.5 13.6 13.6  PLT 301 320 303     Radiology    No results found.  Cardiac Studies   Echocardiogram 08/17/2018 IMPRESSIONS 1. The left ventricle has normal systolic function, with an ejection fraction of 60-65%. The cavity size was normal. Left ventricular diastolic function could not be evaluated secondary to atrial fibrillation. No evidence of left ventricular regional  wall motion abnormalities. 2. The right ventricle has normal systolc function. The cavity was normal. There is no increase in right ventricular wall thickness. Right ventricular systolic pressure is mildly elevated with an estimated pressure of 43.5 mmHg. 3. Left atrial size was moderately dilated. 4. Right atrial size was moderately dilated. 5. Small pericardial effusion. 6. The pericardial effusion is circumferential. 7. The aortic valve is tricuspid Mild thickening of the  aortic valve Mild calcification of the aortic valve. 8. The inferior vena cava was dilated in size with <50% respiratory variability.  Patient Profile     85 y.o. female with a hx of paroxysmal asymptomaticatrial fibrillationon Eliquis,mild diastolic CHF,HTN, hiatal hernia, osteopenia, degenerative joint disease, scoliosis, hearing lossadmitted 03/14 with acute cholecystitis/ choledocholithiasis, complicated by persistentatrial fibrillationwith RVR, difficult to rate control due to low BP. Status post cholecystostomy drain on arrival, ERCP with stone extraction on 08/21/2018.  Assessment & Plan    1. AFib:  - Amio 200 mg qd and Lopressor 25 mg tid, no doses missed - Eliquis for anticoagulation - rate control is reasonable and SBP 90s at times, do not think she will tolerate more BB - reluctant to try dig since renal function worsened w/ diuresis, but could use low dose.  2. CHF:  - was on IV Lasix 40 mg bid>>po Lasix 40 mg qd - still w/ mild volume overload, but Cr peak 1.55, now trending down.  - she got Lasix 40 mg IV x 1 yesterday, consider restarting this dose if additional diuresis needed.  - ambulate and see how tolerated. If sats drop, may need more IV Lasix - make sure daily wts are standing.  For questions or updates, please contact [place] Please consult [email or web address] for contact info under        Signed, Rosaria Ferries, PA-C  08/24/2018, 7:32 AM    Patient examined chart reviewed afib rate reasonably controlled and BP limiting further escalation of beta blocker dose Follow Cr in regard to lasix dosing Sats ok at rest Continue eliquis for anticoagulation  Jenkins Rouge

## 2018-08-24 NOTE — TOC Progression Note (Addendum)
Transition of Care [facility]) - Progression Note    Patient Details  Name: AJANI SCHNIEDERS MRN: [id number] Date of Birth: 08-24-1921  Transition of [facility] CM/SW Contact  Zenon Mayo, RN Phone Number: 08/24/2018, 2:23 PM  Clinical Narrative:    From home with grandson, per pt eval rec HHPT,  Grandson states she has [facility] services with an agency but can not remember the name will call me back with the agency name.  She will need HHRN for drain (drain daily) RN to show grandson how to do, and HHPT. Will need orders prior to dc.  Also eliquis is new for patient, awaiting benefit check.  She has walker, cane at home, she has PCP, her meds are usually mailed to her , if not she uses [facility] representative.  Grandson phone is [phone]. Grandson  States patient was already taking eliquis, NCM informed grandson that if she has use the 30 day free coupon before , that she may not be able to use it.   Expected Discharge Plan: [place] Barriers to Discharge: No Barriers Identified  Expected Discharge Plan and Services Expected Discharge Plan: [place] In-house Referral: NA Discharge Planning Services: CM Consult Post Acute Care Choice: [place] arrangements for the past 2 months: Single Family Home Expected Discharge Date: 08/21/18               DME Arranged: N/A DME Agency: NA HH Arranged: RN, PT,OT [place] Agency: [facility] [place] Determinants of Health (SDOH) Interventions    Readmission Risk Interventions No flowsheet data found.

## 2018-08-24 NOTE — Progress Notes (Signed)
Patient ID: Kylie May, female   DOB: 08-May-1922, 85 y.o.   MRN: [id number]   IR Note via telephone secondary new regulations  Perc chole drain placed in IR 3/15 Cholangiogram 3/17 ERCP and stent placement; stone retraction 3/21  Afeb; wbc wnl  Flushes easily per RN Site is clean and dry NT    Upon DC (maybe today)-- will need to flush daily with 5-10 cc sterile saline Record OP  IR will see pt in OP Clinic 6 weeks or so We will call pt with follow up date and time Plan per Dr Georgiann Cocker

## 2018-08-24 NOTE — Progress Notes (Signed)
PROGRESS NOTE    Kylie May  QBH:[id number] DOB: March 10, 1922 DOA: 08/15/2018 PCP: [name], MD     Brief Narrative:  Kylie May is a 85 year old female who presented with severe epigastric pain radiated to her back.  She does have significant past medical history for atrial fibrillation, chronic diastolic heart failure, mitral valve prolapse, dyslipidemia and esophageal strictures.  She was initially admitted to [facility], her work-up included a CT of her abdomen which showed intra-and extrahepatic duct dilatation and gallbladder wall thickening but no stones.  Patient was transferred to [facility] for ERCP.  Patient underwent cholecystostomy tube placement by interventional radiology, ERCP showed dilation of the bile duct, cholelithiasis.  Patient had stone removed, biliary sphincterectomy performed and a plastic stent was placed into the pancreatic duct. During hospital stay patient developed atrial fibrillation with rapid ventricular response complicated by decompensated heart failure.  New events last 24 hours / Subjective: No new complaints this morning, denies any chest pain, shortness of breath, nausea, vomiting or abdominal pain.  Wants to go home.  Her heart rate remains in the 100-130s on telemetry at rest  Assessment & Plan:   Active Problems:   Hyperlipidemia   Mitral regurgitation   Chronic diastolic CHF (congestive heart failure) (HCC)   Transaminitis   Choledocholithiasis with acute cholecystitis   Atrial fibrillation with RVR (HCC)   Sepsis (HCC)   Septic shock (HCC)   Goals of care, counseling/discussion   Choledocholithiasis complicated with cholecystitis and septic shock sp cholecystostomy tube. No further abdominal pain, patient has remained afebrile, and no significant leukocytosis. No plans for cholecystectomy on this admission. Augmentin plan for 10 days total antibiotic therapy.  Follow-up with general  surgery as an outpatient  Chronic atrial fibrillation complicated with RVR and acute decompensation of diastolic heart failure. Continue anticoagulation with apixaban, continue amiodarone, metoprolol.  Appreciate cardiology recommendations.  PO Lasix  AKI.  Lasix switched to PO, repeat BMP in a.m.   DVT prophylaxis: Eliquis Code Status: Full code Family Communication: Grandson at bedside Disposition Plan: Pending improvement in her heart rate, renal function further cardiology recommendations.  Hopefully discharge with home health 3/24   Consultants:   IR  GI  Cardiology  General surgery  Procedures:   Cholecystostomy tube  ERCP with stone removal, pancreatic duct stent placement  Antimicrobials:  Anti-infectives (From admission, onward)   Start     Dose/Rate Route Frequency Ordered Stop   08/23/18 2200  amoxicillin-clavulanate (AUGMENTIN) 875-125 MG per tablet 1 tablet  Status:  Discontinued     1 tablet Oral Every 12 hours 08/23/18 1600 08/23/18 1601   08/23/18 2200  amoxicillin-clavulanate (AUGMENTIN) 500-125 MG per tablet 500 mg     1 tablet Oral Every 12 hours 08/23/18 1601     08/23/18 1400  piperacillin-tazobactam (ZOSYN) IVPB 2.25 g  Status:  Discontinued     2.25 g 100 mL/hr over 30 Minutes Intravenous Every 8 hours 08/23/18 1101 08/23/18 1600   08/16/18 1740  vancomycin (VANCOCIN) 1-5 GM/200ML-% IVPB  Status:  Discontinued    Note to Pharmacy:  Desiree Hane   : cabinet override      08/16/18 1740 08/16/18 1824   08/15/18 2200  cefTRIAXone (ROCEPHIN) 2 g in sodium chloride 0.9 % 100 mL IVPB  Status:  Discontinued     2 g 200 mL/hr over 30 Minutes Intravenous Every 24 hours 08/15/18 1957 08/15/18 2005   08/15/18 2200  piperacillin-tazobactam (ZOSYN) IVPB 3.375  g  Status:  Discontinued     3.375 g 12.5 mL/hr over 240 Minutes Intravenous Every 8 hours 08/15/18 2036 08/23/18 1101   08/15/18 2000  metroNIDAZOLE (FLAGYL) IVPB 500 mg  Status:  Discontinued      500 mg 100 mL/hr over 60 Minutes Intravenous Every 8 hours 08/15/18 1957 08/15/18 2005        Objective: Vitals:   08/24/18 0657 08/24/18 0817 08/24/18 1222 08/24/18 1223  BP: 105/62  101/83   Pulse: 90     Resp: (!) 21   20  Temp: (!) 97.3 F (36.3 C)  98.1 F (36.7 C)   TempSrc: Oral  Oral   SpO2: 94%     Weight:  57 kg    Height:       No intake or output data in the 24 hours ending 08/24/18 1313 Filed Weights   08/22/18 0605 08/23/18 0900 08/24/18 0817  Weight: 58.2 kg 58.2 kg 57 kg    Examination:  General exam: Appears calm and comfortable, hard of hearing Respiratory system: Clear to auscultation. Respiratory effort normal. Cardiovascular system: S1 & S2 heard, irregular rhythm, rate 115 Gastrointestinal system: Abdomen is nondistended, soft and nontender. No organomegaly or masses felt. Normal bowel sounds heard. Central nervous system: Alert and oriented. No focal neurological deficits. Extremities: Symmetric Skin: No rashes, lesions or ulcers Psychiatry: Judgement and insight appear normal. Mood & affect appropriate.   Data Reviewed: I have personally reviewed following labs and imaging studies  CBC: Recent Labs  Lab 08/19/18 0310 08/22/18 0312 08/23/18 0404 08/24/18 0400  WBC 8.8 8.9 10.2 10.2  NEUTROABS  --   --  6.1 6.5  HGB 11.3* 10.6* 10.6* 10.7*  HCT 33.1* 32.6* 32.8* 32.9*  MCV 84.9 85.1 85.4 85.7  PLT 288 301 320 295   Basic Metabolic Panel: Recent Labs  Lab 08/18/18 0342 08/19/18 0310 08/22/18 0312 08/23/18 0404 08/24/18 0400  NA  --  135 132* 134* 137  K  --  3.8 4.2 3.7 3.5  CL  --  109 104 104 104  CO2  --  20* 19* 20* 22  GLUCOSE  --  99 139* 104* 91  BUN  --  16 14 19 23   CREATININE  --  0.97 1.27* 1.55* 1.48*  CALCIUM  --  8.6* 8.6* 8.5* 8.7*  MG 2.0 1.7  --  1.6*  --    GFR: Estimated Creatinine Clearance: 16.9 mL/min (A) (by C-G formula based on SCr of 1.48 mg/dL (H)). Liver Function Tests: Recent Labs  Lab 08/19/18  0310  AST 22  ALT 46*  ALKPHOS 93  BILITOT 1.2  PROT 5.2*  ALBUMIN 2.2*   No results for input(s): LIPASE, AMYLASE in the last 168 hours. No results for input(s): AMMONIA in the last 168 hours. Coagulation Profile: No results for input(s): INR, PROTIME in the last 168 hours. Cardiac Enzymes: No results for input(s): CKTOTAL, CKMB, CKMBINDEX, TROPONINI in the last 168 hours. BNP (last 3 results) No results for input(s): PROBNP in the last 8760 hours. HbA1C: No results for input(s): HGBA1C in the last 72 hours. CBG: No results for input(s): GLUCAP in the last 168 hours. Lipid Profile: No results for input(s): CHOL, HDL, LDLCALC, TRIG, CHOLHDL, LDLDIRECT in the last 72 hours. Thyroid Function Tests: No results for input(s): TSH, T4TOTAL, FREET4, T3FREE, THYROIDAB in the last 72 hours. Anemia Panel: No results for input(s): VITAMINB12, FOLATE, FERRITIN, TIBC, IRON, RETICCTPCT in the last 72 hours. Sepsis Labs: No  results for input(s): PROCALCITON, LATICACIDVEN in the last 168 hours.  Recent Results (from the past 240 hour(s))  Culture, blood (routine x 2)     Status: None   Collection Time: 08/15/18  3:25 PM  Result Value Ref Range Status   Specimen Description BLOOD RIGHT [facility]  Final   Special Requests BOTTLES DRAWN AEROBIC AND ANAEROBIC  Final   Culture   Final    NO GROWTH 5 DAYS Performed at [facility], [address], [place], [place] [address]    Report Status 08/20/2018 FINAL  Final  Culture, blood (routine x 2)     Status: None   Collection Time: 08/15/18  3:25 PM  Result Value Ref Range Status   Specimen Description BLOOD RFOA  Final   Special Requests BOTTLES DRAWN AEROBIC AND ANAEROBIC  Final   Culture   Final    NO GROWTH 5 DAYS Performed at [facility], [address], [place], [place] [address]    Report Status 08/20/2018 FINAL  Final  Culture, blood (Routine X 2) w Reflex to ID Panel     Status: None   Collection Time: 08/16/18  3:38  PM  Result Value Ref Range Status   Specimen Description BLOOD RIGHT HAND  Final   Special Requests AEROBIC BOTTLE ONLY Blood Culture adequate volume  Final   Culture   Final    NO GROWTH 5 DAYS Performed at [facility], [place] [address], [place], [place] [address]    Report Status 08/21/2018 FINAL  Final  Culture, blood (Routine X 2) w Reflex to ID Panel     Status: None   Collection Time: 08/16/18  3:44 PM  Result Value Ref Range Status   Specimen Description BLOOD LEFT HAND  Final   Special Requests AEROBIC BOTTLE ONLY Blood Culture adequate volume  Final   Culture   Final    NO GROWTH 5 DAYS Performed at [facility], [place] [address] [place], [place] [address]    Report Status 08/21/2018 FINAL  Final  MRSA PCR Screening     Status: None   Collection Time: 08/16/18  4:53 PM  Result Value Ref Range Status   MRSA by PCR NEGATIVE NEGATIVE Final    Comment:        The GeneXpert MRSA Assay (FDA approved for NASAL specimens only), is one component of a comprehensive MRSA colonization surveillance program. It is not intended to diagnose MRSA infection nor to guide or monitor treatment for MRSA infections. Performed at [facility], [place] [address] [place], [place] [address]   Urine Culture     Status: Abnormal   Collection Time: 08/16/18  8:37 PM  Result Value Ref Range Status   Specimen Description URINE, CLEAN CATCH  Final   Special Requests NONE  Final   Culture (A)  Final    <10,000 COLONIES/mL INSIGNIFICANT GROWTH Performed at [facility], [place] [address], [place], [place] [address]    Report Status 08/18/2018 FINAL  Final       Radiology Studies: No results found.    Scheduled Meds: . amiodarone  200 mg Oral BID  . amoxicillin-clavulanate  1 tablet Oral Q12H  . apixaban  2.5 mg Oral BID  . furosemide  40 mg Oral Daily  . magnesium chloride  2 tablet Oral Daily  . mouth rinse  15 mL Mouth Rinse BID  . metoprolol tartrate  25 mg Oral  Q8H  .  pantoprazole  40 mg Oral Daily  . rosuvastatin  20 mg Oral Daily  . sodium chloride flush  5 mL Intracatheter Q8H   Continuous Infusions: . sodium chloride 10 mL/hr at 08/19/18 1100     LOS: 9 days    Time spent: 45 minutes   Dessa Phi, DO [facility] [email or web address] 08/24/2018, 1:13 PM

## 2018-08-24 NOTE — Evaluation (Signed)
Physical Therapy Evaluation Patient Details Name: Kylie May MRN: [id number] DOB: May 15, 1922 Today's Date: 08/24/2018   History of Present Illness  Patient is a 85 y/o female presenting to the ED on 08/14/2018 with primary complaints of severe epigastric pain radiating to back. CT of her abdomen showed intra-and hepatic duct dilatation and gallbladder wall thickening but no stones. Past medical history of diastolic heart failure, GERD, Schatzki's ring, diverticulitis and A. fib. Catheter placement of gallbladder on 08/16/18. ERCP on 08/21/18.     Clinical Impression  Patient admitted with the above listed diagnosis. Patient reports Mod I with mobility and ADLs prior to admission. Grandson present for session and supportive. Patient today requiring grossly min guard for mobility - does require verbal cueing for safety with mobility. Mild unsteadiness with gait with PT to recommend HHPT and 24/hr supervision at discharge. PT to follow acutely.     Follow Up Recommendations Home health PT;Supervision/Assistance - 24 hour    Equipment Recommendations  None recommended by PT    Recommendations for Other Services       Precautions / Restrictions Precautions Precautions: Fall Restrictions Weight Bearing Restrictions: No      Mobility  Bed Mobility Overal bed mobility: Needs Assistance Bed Mobility: Supine to Sit     Supine to sit: Supervision     General bed mobility comments: for line and tube management  Transfers Overall transfer level: Needs assistance Equipment used: Rolling walker (2 wheeled);None Transfers: Sit to/from [employer] to Stand: Min guard Stand pivot transfers: Min guard       General transfer comment: min guard for safety and immediate standing balance  Ambulation/Gait Ambulation/Gait assistance: Min assist;Min guard Gait Distance (Feet): 120 Feet Assistive device: None Gait Pattern/deviations: Step-to pattern;Step-through  pattern;Decreased stride length;Trunk flexed Gait velocity: varying   General Gait Details: cueing to slow pace of gait for improved steadiness; light Min A for balance and safety  Stairs            Wheelchair Mobility    Modified Rankin (Stroke Patients Only)       Balance Overall balance assessment: Needs assistance Sitting-balance support: No upper extremity supported;Feet supported Sitting balance-Leahy Scale: Good     Standing balance support: No upper extremity supported;During functional activity Standing balance-Leahy Scale: Fair                               Pertinent Vitals/Pain Pain Assessment: No/denies pain    Home Living Family/patient expects to be discharged to:: Private residence Living Arrangements: Other relatives(grandson) Available Help at Discharge: Family;Available 24 hours/day;Available PRN/intermittently Type of Home: House Home Access: Stairs to enter Entrance Stairs-Rails: None Entrance Stairs-Number of Steps: (1 threshold) Home Layout: Two level Home Equipment: Environmental consultant - 2 wheels Additional Comments: (does not use RW)    Prior Function Level of Independence: Independent               Hand Dominance        Extremity/Trunk Assessment   Upper Extremity Assessment Upper Extremity Assessment: Defer to OT evaluation    Lower Extremity Assessment Lower Extremity Assessment: Generalized weakness    Cervical / Trunk Assessment Cervical / Trunk Assessment: Normal  Communication   Communication: HOH  Cognition Arousal/Alertness: Awake/alert Behavior During Therapy: WFL for tasks assessed/performed Overall Cognitive Status: Within Functional Limits for tasks assessed  General Comments General comments (skin integrity, edema, etc.): grandson present for session    Exercises     Assessment/Plan    PT Assessment Patient needs continued PT services  PT  Problem List Decreased strength;Decreased activity tolerance;Decreased balance;Decreased mobility;Decreased knowledge of use of DME;Decreased safety awareness       PT Treatment Interventions DME instruction;Gait training;Stair training;Functional mobility training;Therapeutic activities;Therapeutic exercise;Balance training;Patient/family education    PT Goals (Current goals can be found in the Care Plan section)  Acute Rehab PT Goals Patient Stated Goal: return home soon PT Goal Formulation: With patient Time For Goal Achievement: 09/07/18 Potential to Achieve Goals: Good    Frequency Min 3X/week   Barriers to discharge        Co-evaluation               AM-PAC PT "6 Clicks" Mobility  Outcome Measure Help needed turning from your back to your side while in a flat bed without using bedrails?: A Little Help needed moving from lying on your back to sitting on the side of a flat bed without using bedrails?: A Little Help needed moving to and from a bed to a chair (including a wheelchair)?: A Little Help needed standing up from a chair using your arms (e.g., wheelchair or bedside chair)?: A Little Help needed to walk in hospital room?: A Little Help needed climbing 3-5 steps with a railing? : A Lot 6 Click Score: 17    End of Session Equipment Utilized During Treatment: Gait belt Activity Tolerance: Patient tolerated treatment well Patient left: in chair;with call bell/phone within reach;with family/visitor present Nurse Communication: Mobility status PT Visit Diagnosis: Unsteadiness on feet (R26.81);Other abnormalities of gait and mobility (R26.89);Muscle weakness (generalized) (M62.81)    Time: 6237-6283 PT Time Calculation (min) (ACUTE ONLY): 25 min   Charges:   PT Evaluation $PT Eval Moderate Complexity: 1 Mod PT Treatments $Gait Training: 8-22 mins        Lanney Gins, PT, DPT Supplemental Physical Therapist 08/24/18 9:39 AM Pager:  [phone] Office: [phone]

## 2018-08-25 LAB — BASIC METABOLIC PANEL
Anion gap: 8 (ref 5–15)
BUN: 20 mg/dL (ref 8–23)
CO2: 23 mmol/L (ref 22–32)
Calcium: 8.5 mg/dL — ABNORMAL LOW (ref 8.9–10.3)
Chloride: 105 mmol/L (ref 98–111)
Creatinine, Ser: 1.27 mg/dL — ABNORMAL HIGH (ref 0.44–1.00)
GFR calc Af Amer: 42 mL/min — ABNORMAL LOW (ref >60)
GFR calc non Af Amer: 36 mL/min — ABNORMAL LOW (ref >60)
Glucose, Bld: 94 mg/dL (ref 70–99)
Potassium: 3.4 mmol/L — ABNORMAL LOW (ref 3.5–5.1)
Sodium: 136 mmol/L (ref 135–145)

## 2018-08-25 LAB — MAGNESIUM: Magnesium: 1.7 mg/dL (ref 1.7–2.4)

## 2018-08-25 MED ORDER — METOPROLOL TARTRATE 50 MG PO TABS
50.0000 mg | ORAL_TABLET | Freq: Two times a day (BID) | ORAL | Status: DC
  Administered 2018-08-25 – 2018-08-26 (×2): 50 mg via ORAL
  Filled 2018-08-25 (×2): qty 1

## 2018-08-25 MED ORDER — POTASSIUM CHLORIDE CRYS ER 20 MEQ PO TBCR
40.0000 meq | EXTENDED_RELEASE_TABLET | Freq: Once | ORAL | Status: AC
  Administered 2018-08-25: 40 meq via ORAL
  Filled 2018-08-25: qty 2

## 2018-08-25 NOTE — Progress Notes (Signed)
Patient c/o RUQ pain, worse with movement or deep breath, states she has not been having pain.  Chole drain site CDI, draining to gravity brown fluid, flushed with 5cc NS without problem.  BP 99/71, HR 110s.  Dr. Maylene Roes notified.

## 2018-08-25 NOTE — Discharge Instructions (Signed)
Cholecystitis ° °Cholecystitis is irritation and swelling (inflammation) of the gallbladder. The gallbladder is an organ that is shaped like a pear. It is under the liver on the right side of the body. This organ stores bile. Bile helps the body break down (digest) the fats in food. °This condition can occur all of a sudden. It needs to be treated. °What are the causes? °This condition may be caused by stones or lumps that form in the gallbladder (gallstones). Gallstones can block the tube (duct) that carries bile out of your gallbladder. °Other causes are: °· Damage to the gallbladder due to less blood flow. °· Germs in the bile ducts. °· Scars or kinks in the bile ducts. °· Abnormal growths (tumors) in the liver, pancreas, or gallbladder. °What increases the risk? °You are more likely to develop this condition if: °· You have sickle cell disease. °· You take birth control pills.  °· You use estrogen. °· You have alcoholic liver disease. °· You have liver cirrhosis. °· You are being fed through a vein. °· You are very ill. °· You do not eat or drink for a long time. This is also called "fasting." °· You are overweight (obese). °· You lose weight too fast. °· You are pregnant. °· You have high levels of fat in the blood (triglycerides). °· You have irritation and swelling of the pancreas (pancreatitis). °What are the signs or symptoms? °Symptoms of this condition include: °· Pain in the belly (abdomen). Pain is often in the upper right area of the belly. °· Tenderness or bloating in the belly. °· Feeling sick to your stomach (nauseous). °· Throwing up (vomiting). °· Fever. °· Chills. °How is this diagnosed? °This condition may be diagnosed with a medical history and exam. You may also have other tests, such as: °· Imaging tests. This may include: °? Ultrasound. °? CT scan of the belly. °? Nuclear scan. This is also called a HIDA scan. This scan lets your doctor see the bile as it moves in your body. °? MRI. °· Blood  tests. These are done to check: °? Your blood count. The white blood cell count may be higher than normal. °? How well your liver works. °How is this treated? °This condition may be treated with: °· Surgery to take out your gallbladder. °· Antibiotic medicines to treat illnesses caused by germs. °· Going without food for some time. °· Giving fluids through an IV tube. °· Medicines to treat pain or throwing up. °Follow these instructions at home: °· If you had surgery, follow instructions from your doctor about how to care for yourself after you go home. °Medicines ° °· Take over-the-counter and prescription medicines only as told by your doctor. °· If you were prescribed an antibiotic medicine, take it as told by your doctor. Do not stop taking it even if you start to feel better. °General instructions °· Follow instructions from your doctor about what to eat or drink. Do not eat or drink anything that makes you sick again. °· Do not lift anything that is heavier than 10 lb (4.5 kg) until your doctor says that it is safe. °· Do not use any products that contain nicotine or tobacco, such as cigarettes and e-cigarettes. If you need help quitting, ask your doctor. °· Keep all follow-up visits as told by your doctor. This is important. °Contact a doctor if: °· You have pain and your medicine does not help. °· You have a fever. °Get help right away if: °·   Your pain moves to: ? Another part of your belly. ? Your back.  Your symptoms do not go away.  You have new symptoms. Summary  Cholecystitis is swelling and irritation of the gallbladder.  This condition may be caused by stones or lumps that form in the gallbladder (gallstones).  Common symptoms are pain in the belly. You may feel sick to your stomach and start throwing up. You may also have a fever and chills.  This condition may be treated with surgery to take out the gallbladder. It may also be treated with medicines, fasting, and fluids through an IV  tube.  Follow what you are told about eating and drinking. Do not eat things that make you sick again. This information is not intended to replace advice given to you by your health care provider. Make sure you discuss any questions you have with your health care provider. Document Released: 05/09/2011 Document Revised: 09/26/2017 Document Reviewed: 09/26/2017 Elsevier Interactive Patient Education  2019 [place] After This sheet gives you information about how to care for yourself after your procedure. Your health care provider may also give you more specific instructions. If you have problems or questions, contact your health care provider. What can I expect after the procedure? After your procedure, it is common to have soreness near the incision site of your drainage tube (catheter). Follow these instructions at home: Incision care   Follow instructions from your health care provider about how to take care of your incision site where the catheter was inserted. Make sure you: ? Wash your hands with soap and water before and after you change your bandage (dressing). If soap and water are not available, use hand sanitizer. ? Change your dressing as told by your health care provider.  Check the incision site every day for signs of infection. Check for: ? Redness, swelling, or pain. ? Fluid or blood. ? Warmth. ? Pus or a bad smell.  Do not take baths, swim, or use a hot tub until your health care provider approves. Ask your health care provider if you may take showers. You may only be allowed to take sponge baths. General instructions  Follow instructions from your health care provider about how to care for your catheter and collection bag at home.  Your health care provider will show you: ? How to record the amount of drainage from the catheter. ? How to flush the catheter. ? How to care for the catheter incision site.  Follow instructions from your  health care provider about eating or drinking restrictions.  Take over-the-counter and prescription medicines only as told by your health care provider.  Keep all follow-up visits as told by your health care provider. This is important. Contact a health care provider if:  You have redness, swelling, or pain around the catheter incision site.  You have nausea or vomiting. Get help right away if:  Your abdominal pain gets worse.  You feel dizzy or you faint while standing.  You have fluid or blood coming from the catheter incision site.  The area around the catheter incision site feels warm to the touch.  You have pus or a bad smell coming from the catheter incision site.  You have a fever.  You have shortness of breath.  You have a rapid heartbeat.  Your nausea or vomiting does not go away.  Your catheter becomes blocked.  Your catheter comes out of your abdomen. Summary  After your procedure,  it is common to have soreness near the incision site of your drainage tube (catheter).  Wash your hands with soap and water before and after you change your bandage (dressing). Change your dressing as told by your health care provider.  Check the catheter incision site every day for signs of infection. Check for redness, swelling, pain, fluid, blood, warmth, pus, or a bad smell.  Contact your health care provider if you have nausea or vomiting, or if you have redness, swelling, or pain around your catheter incision site.  Get help right away if your abdominal pain gets worse, you feel dizzy, you have blood or fluid coming from the catheter incision site, you have a fever, or you have shortness of breath. This information is not intended to replace advice given to you by your health care provider. Make sure you discuss any questions you have with your health care provider. Document Released: 02/08/2015 Document Revised: 12/15/2017 Document Reviewed: 12/15/2017 Elsevier Interactive  Patient Education  2019 Else[employer] In   Information on my medicine - ELIQUIS (apixaban)  This medication education was reviewed with me or my healthcare representative as part of my discharge preparation.   You were taking this medication prior to this hospital admission.   Why was Eliquis prescribed for you? Eliquis was prescribed for you to reduce the risk of a blood clot forming that can cause a stroke if you have a medical condition called atrial fibrillation (a type of irregular heartbeat).  What do You need to know about Eliquis ? Take your Eliquis TWICE DAILY - one tablet in the morning and one tablet in the evening with or without food. If you have difficulty swallowing the tablet whole please discuss with your pharmacist how to take the medication safely.  Take Eliquis exactly as prescribed by your doctor and DO NOT stop taking Eliquis without talking to the doctor who prescribed the medication.  Stopping may increase your risk of developing a stroke.  Refill your prescription before you run out.  After discharge, you should have regular check-up appointments with your healthcare provider that is prescribing your Eliquis.  In the future your dose may need to be changed if your kidney function or weight changes by a significant amount or as you get older.  What do you do if you miss a dose? If you miss a dose, take it as soon as you remember on the same day and resume taking twice daily.  Do not take more than one dose of ELIQUIS at the same time to make up a missed dose.  Important Safety Information A possible side effect of Eliquis is bleeding. You should call your healthcare provider right away if you experience any of the following: ? Bleeding from an injury or your nose that does not stop. ? Unusual colored urine (red or dark brown) or unusual colored stools (red or black). ? Unusual bruising for unknown reasons. ? A serious fall or if you hit your head (even if there  is no bleeding).  Some medicines may interact with Eliquis and might increase your risk of bleeding or clotting while on Eliquis. To help avoid this, consult your healthcare provider or pharmacist prior to using any new prescription or non-prescription medications, including herbals, vitamins, non-steroidal anti-inflammatory drugs (NSAIDs) and supplements.  This website has more information on Eliquis (apixaban): http://[email or web address].com/eliquis/home

## 2018-08-25 NOTE — Progress Notes (Addendum)
PROGRESS NOTE    Kylie May  DJS:[id number] DOB: 1921-09-04 DOA: 08/15/2018 PCP: Kylie Haven, MD     Brief Narrative:  Kylie May is a 85 year old female who presented with severe epigastric pain radiated to her back.  She does have significant past medical history for atrial fibrillation, chronic diastolic heart failure, mitral valve prolapse, dyslipidemia and esophageal strictures.  She was initially admitted to [facility], her work-up included a CT of her abdomen which showed intra-and extrahepatic duct dilatation and gallbladder wall thickening but no stones.  Patient was transferred to [facility] for ERCP.  Patient underwent cholecystostomy tube placement by interventional radiology, ERCP showed dilation of the bile duct, cholelithiasis.  Patient had stone removed, biliary sphincterectomy performed and a plastic stent was placed into the pancreatic duct. During hospital stay patient developed atrial fibrillation with rapid ventricular response complicated by decompensated heart failure.  New events last 24 hours / Subjective: Had some RUQ pain this morning, lasted about 30 minutes and resolved.  Denies any nausea or vomiting or abdominal pain currently.  Denies any chest pain or shortness of breath.  Assessment & Plan:   Active Problems:   Hyperlipidemia   Mitral regurgitation   Chronic diastolic CHF (congestive heart failure) (HCC)   Transaminitis   Choledocholithiasis with acute cholecystitis   Atrial fibrillation with RVR (HCC)   Sepsis (HCC)   Septic shock (HCC)   Goals of care, counseling/discussion   Choledocholithiasis complicated with cholecystitis and septic shock sp cholecystostomy tube. No plans for cholecystectomy on this admission. Augmentin plan for 10 days total antibiotic therapy.  Follow-up with general surgery as an outpatient  Chronic atrial fibrillation complicated with RVR and acute decompensation of  diastolic heart failure. Continue anticoagulation with apixaban, continue amiodarone, metoprolol.  Appreciate cardiology recommendations.  PO Lasix.  Continues to have elevated heart rate, metoprolol dose increased per cardiology  AKI.  Lasix switched to PO, kidney function improving.  Hypokalemia. Replaced, trend   DVT prophylaxis: Eliquis Code Status: Full code Family Communication: Updated grandson over the phone  Disposition Plan: Pending improvement in her heart rate, renal function further cardiology recommendations.  Hopefully discharge with home health 3/25    Consultants:   IR  GI  Cardiology  General surgery  Procedures:   Cholecystostomy tube  ERCP with stone removal, pancreatic duct stent placement  Antimicrobials:  Anti-infectives (From admission, onward)   Start     Dose/Rate Route Frequency Ordered Stop   08/23/18 2200  amoxicillin-clavulanate (AUGMENTIN) 875-125 MG per tablet 1 tablet  Status:  Discontinued     1 tablet Oral Every 12 hours 08/23/18 1600 08/23/18 1601   08/23/18 2200  amoxicillin-clavulanate (AUGMENTIN) 500-125 MG per tablet 500 mg     1 tablet Oral Every 12 hours 08/23/18 1601     08/23/18 1400  piperacillin-tazobactam (ZOSYN) IVPB 2.25 g  Status:  Discontinued     2.25 g 100 mL/hr over 30 Minutes Intravenous Every 8 hours 08/23/18 1101 08/23/18 1600   08/16/18 1740  vancomycin (VANCOCIN) 1-5 GM/200ML-% IVPB  Status:  Discontinued    Note to Pharmacy:  Desiree Hane   : cabinet override      08/16/18 1740 08/16/18 1824   08/15/18 2200  cefTRIAXone (ROCEPHIN) 2 g in sodium chloride 0.9 % 100 mL IVPB  Status:  Discontinued     2 g 200 mL/hr over 30 Minutes Intravenous Every 24 hours 08/15/18 1957 08/15/18 2005   08/15/18 2200  piperacillin-tazobactam (ZOSYN) IVPB 3.375 g  Status:  Discontinued     3.375 g 12.5 mL/hr over 240 Minutes Intravenous Every 8 hours 08/15/18 2036 08/23/18 1101   08/15/18 2000  metroNIDAZOLE (FLAGYL) IVPB 500  mg  Status:  Discontinued     500 mg 100 mL/hr over 60 Minutes Intravenous Every 8 hours 08/15/18 1957 08/15/18 2005       Objective: Vitals:   08/24/18 1500 08/24/18 2055 08/25/18 0640 08/25/18 0755  BP: 95/72 91/63 116/83 99/71  Pulse: 95 (!) 109 (!) 113 (!) 110  Resp:  (!) 22 20 20   Temp:  (!) 97.3 F (36.3 C) 97.6 F (36.4 C)   TempSrc:  Oral Axillary   SpO2:   93%   Weight:   55.8 kg   Height:        Intake/Output Summary (Last 24 hours) at 08/25/2018 1015 Last data filed at 08/25/2018 0810 Gross per 24 hour  Intake 375 ml  Output 150 ml  Net 225 ml   Filed Weights   08/23/18 0900 08/24/18 0817 08/25/18 0640  Weight: 58.2 kg 57 kg 55.8 kg    Examination: General exam: Appears calm and comfortable, hard of hearing Respiratory system: Clear to auscultation. Respiratory effort normal. Cardiovascular system: S1 & S2 heard, irregular rhythm, rate 110s. No JVD, murmurs, rubs, gallops or clicks. No pedal edema. Gastrointestinal system: Abdomen is nondistended, soft and nontender. No organomegaly or masses felt. Normal bowel sounds heard. Central nervous system: Alert and oriented. No focal neurological deficits.  Extremities: Symmetric 5 x 5 power. Skin: No rashes, lesions or ulcers Psychiatry: Judgement and insight appear normal. Mood & affect appropriate.    Data Reviewed: I have personally reviewed following labs and imaging studies  CBC: Recent Labs  Lab 08/19/18 0310 08/22/18 0312 08/23/18 0404 08/24/18 0400  WBC 8.8 8.9 10.2 10.2  NEUTROABS  --   --  6.1 6.5  HGB 11.3* 10.6* 10.6* 10.7*  HCT 33.1* 32.6* 32.8* 32.9*  MCV 84.9 85.1 85.4 85.7  PLT 288 301 320 962   Basic Metabolic Panel: Recent Labs  Lab 08/19/18 0310 08/22/18 0312 08/23/18 0404 08/24/18 0400 08/25/18 0311  NA 135 132* 134* 137 136  K 3.8 4.2 3.7 3.5 3.4*  CL 109 104 104 104 105  CO2 20* 19* 20* 22 23  GLUCOSE 99 139* 104* 91 94  BUN 16 14 19 23 20   CREATININE 0.97 1.27* 1.55*  1.48* 1.27*  CALCIUM 8.6* 8.6* 8.5* 8.7* 8.5*  MG 1.7  --  1.6*  --  1.7   GFR: Estimated Creatinine Clearance: 19.5 mL/min (A) (by C-G formula based on SCr of 1.27 mg/dL (H)). Liver Function Tests: Recent Labs  Lab 08/19/18 0310  AST 22  ALT 46*  ALKPHOS 93  BILITOT 1.2  PROT 5.2*  ALBUMIN 2.2*   No results for input(s): LIPASE, AMYLASE in the last 168 hours. No results for input(s): AMMONIA in the last 168 hours. Coagulation Profile: No results for input(s): INR, PROTIME in the last 168 hours. Cardiac Enzymes: No results for input(s): CKTOTAL, CKMB, CKMBINDEX, TROPONINI in the last 168 hours. BNP (last 3 results) No results for input(s): PROBNP in the last 8760 hours. HbA1C: No results for input(s): HGBA1C in the last 72 hours. CBG: No results for input(s): GLUCAP in the last 168 hours. Lipid Profile: No results for input(s): CHOL, HDL, LDLCALC, TRIG, CHOLHDL, LDLDIRECT in the last 72 hours. Thyroid Function Tests: No results for input(s): TSH, T4TOTAL, FREET4, T3FREE,  THYROIDAB in the last 72 hours. Anemia Panel: No results for input(s): VITAMINB12, FOLATE, FERRITIN, TIBC, IRON, RETICCTPCT in the last 72 hours. Sepsis Labs: No results for input(s): PROCALCITON, LATICACIDVEN in the last 168 hours.  Recent Results (from the past 240 hour(s))  Culture, blood (routine x 2)     Status: None   Collection Time: 08/15/18  3:25 PM  Result Value Ref Range Status   Specimen Description BLOOD RIGHT [facility]  Final   Special Requests BOTTLES DRAWN AEROBIC AND ANAEROBIC  Final   Culture   Final    NO GROWTH 5 DAYS Performed at [facility], [address], [place], [place] [address]    Report Status 08/20/2018 FINAL  Final  Culture, blood (routine x 2)     Status: None   Collection Time: 08/15/18  3:25 PM  Result Value Ref Range Status   Specimen Description BLOOD RFOA  Final   Special Requests BOTTLES DRAWN AEROBIC AND ANAEROBIC  Final   Culture   Final    NO GROWTH 5  DAYS Performed at [facility], [address], [place], [place] [address]    Report Status 08/20/2018 FINAL  Final  Culture, blood (Routine X 2) w Reflex to ID Panel     Status: None   Collection Time: 08/16/18  3:38 PM  Result Value Ref Range Status   Specimen Description BLOOD RIGHT HAND  Final   Special Requests AEROBIC BOTTLE ONLY Blood Culture adequate volume  Final   Culture   Final    NO GROWTH 5 DAYS Performed at [facility], [place] [address], [place], [place] [address]    Report Status 08/21/2018 FINAL  Final  Culture, blood (Routine X 2) w Reflex to ID Panel     Status: None   Collection Time: 08/16/18  3:44 PM  Result Value Ref Range Status   Specimen Description BLOOD LEFT HAND  Final   Special Requests AEROBIC BOTTLE ONLY Blood Culture adequate volume  Final   Culture   Final    NO GROWTH 5 DAYS Performed at [facility], [place] [address], [place], [place] [address]    Report Status 08/21/2018 FINAL  Final  MRSA PCR Screening     Status: None   Collection Time: 08/16/18  4:53 PM  Result Value Ref Range Status   MRSA by PCR NEGATIVE NEGATIVE Final    Comment:        The GeneXpert MRSA Assay (FDA approved for NASAL specimens only), is one component of a comprehensive MRSA colonization surveillance program. It is not intended to diagnose MRSA infection nor to guide or monitor treatment for MRSA infections. Performed at [facility], [place] [address] [place], [place] [address]   Urine Culture     Status: Abnormal   Collection Time: 08/16/18  8:37 PM  Result Value Ref Range Status   Specimen Description URINE, CLEAN CATCH  Final   Special Requests NONE  Final   Culture (A)  Final    <10,000 COLONIES/mL INSIGNIFICANT GROWTH Performed at [facility], [place] [address], [place],  [address]    Report Status 08/18/2018 FINAL  Final       Radiology Studies: No results found.    Scheduled Meds: . amiodarone  200 mg Oral  BID  . amoxicillin-clavulanate  1 tablet Oral Q12H  . apixaban  2.5 mg Oral BID  . furosemide  40 mg Oral Daily  . magnesium chloride  2 tablet Oral Daily  . mouth rinse  15 mL Mouth Rinse BID  . metoprolol tartrate  50 mg Oral BID  . pantoprazole  40 mg Oral Daily  . rosuvastatin  20 mg Oral Daily  . sodium chloride flush  5 mL Intracatheter Q8H   Continuous Infusions: . sodium chloride 10 mL/hr at 08/19/18 1100     LOS: 10 days    Time spent: 35 minutes   Dessa Phi, DO [facility] [email or web address] 08/25/2018, 10:15 AM

## 2018-08-25 NOTE — Care Management Important Message (Signed)
Important Message  Patient Details  Name: Kylie May MRN: [id number] Date of Birth: 03-13-1922   Medicare Important Message Given:  Yes    Orbie Pyo 08/25/2018, 1:46 PM

## 2018-08-25 NOTE — Progress Notes (Signed)
Physical Therapy Treatment Patient Details Name: Kylie May MRN: [id number] DOB: Dec 16, 1921 Today's Date: 08/25/2018    History of Present Illness Patient is a 85 y/o female presenting to the ED on 08/14/2018 with primary complaints of severe epigastric pain radiating to back. CT of her abdomen showed intra-and hepatic duct dilatation and gallbladder wall thickening but no stones. Past medical history of diastolic heart failure, GERD, Schatzki's ring, diverticulitis and A. fib. Catheter placement of gallbladder on 08/16/18. ERCP on 08/21/18.     PT Comments    Session today somewhat limited by abdominal discomfort as well as high heart rate. Patient ambulating short distance in room - reaching for objects in room for increased stability - recommend at least short term use of RW once discharged home. HR up to 130 in session with limited mobility with patient asymptomatic. PT to continue to follow acutely.    Follow Up Recommendations  Home health PT;Supervision/Assistance - 24 hour     Equipment Recommendations  None recommended by PT    Recommendations for Other Services       Precautions / Restrictions Precautions Precautions: Fall Restrictions Weight Bearing Restrictions: No    Mobility  Bed Mobility Overal bed mobility: Needs Assistance Bed Mobility: Supine to Sit;Sit to Supine     Supine to sit: Supervision Sit to supine: Supervision   General bed mobility comments: cueing for sequencing and safety  Transfers Overall transfer level: Needs assistance Equipment used: None Transfers: Sit to/from Stand;Stand Pivot Transfers Sit to Stand: Min assist;Min guard Stand pivot transfers: Min assist;Min guard       General transfer comment: light Min A to min guard for safety and immediate standing balance; patient reporting mild discomfort at abdomen with mobility  Ambulation/Gait Ambulation/Gait assistance: Min guard Gait Distance (Feet): 15 Feet(x2) Assistive  device: None Gait Pattern/deviations: Step-to pattern;Step-through pattern;Decreased stride length;Trunk flexed Gait velocity: dereased   General Gait Details: patient reaching for objects in room for stability; reports abdominal pain; HR up to 130 with mobility, thus furhter mobility deferred   Stairs             Wheelchair Mobility    Modified Rankin (Stroke Patients Only)       Balance Overall balance assessment: Needs assistance Sitting-balance support: No upper extremity supported;Feet supported Sitting balance-Leahy Scale: Good     Standing balance support: No upper extremity supported;During functional activity Standing balance-Leahy Scale: Fair                              Cognition Arousal/Alertness: Awake/alert Behavior During Therapy: WFL for tasks assessed/performed Overall Cognitive Status: Within Functional Limits for tasks assessed                                        Exercises      General Comments        Pertinent Vitals/Pain Pain Assessment: Faces Faces Pain Scale: Hurts little more Pain Location: abdomen Pain Descriptors / Indicators: Aching;Discomfort Pain Intervention(s): Limited activity within patient's tolerance;Monitored during session;Repositioned    Home Living                      Prior Function            PT Goals (current goals can now be found in the care plan section) Acute Rehab PT  Goals Patient Stated Goal: return home soon PT Goal Formulation: With patient Time For Goal Achievement: 09/07/18 Potential to Achieve Goals: Good Progress towards PT goals: Progressing toward goals    Frequency    Min 3X/week      PT Plan Current plan remains appropriate    Co-evaluation              AM-PAC PT "6 Clicks" Mobility   Outcome Measure  Help needed turning from your back to your side while in a flat bed without using bedrails?: A Little Help needed moving from lying  on your back to sitting on the side of a flat bed without using bedrails?: A Little Help needed moving to and from a bed to a chair (including a wheelchair)?: A Little Help needed standing up from a chair using your arms (e.g., wheelchair or bedside chair)?: A Little Help needed to walk in hospital room?: A Little Help needed climbing 3-5 steps with a railing? : A Lot 6 Click Score: 17    End of Session Equipment Utilized During Treatment: Gait belt Activity Tolerance: Patient tolerated treatment well Patient left: in bed;with call bell/phone within reach;with bed alarm set Nurse Communication: Mobility status PT Visit Diagnosis: Unsteadiness on feet (R26.81);Other abnormalities of gait and mobility (R26.89);Muscle weakness (generalized) (M62.81)     Time: 4037-0964 PT Time Calculation (min) (ACUTE ONLY): 20 min  Charges:  $Gait Training: 8-22 mins                     Lanney Gins, PT, DPT Supplemental Physical Therapist 08/25/18 10:23 AM Pager: [phone] Office: [phone]

## 2018-08-25 NOTE — Progress Notes (Signed)
Progress Note  Patient Name: Kylie May Date of Encounter: 08/25/2018  Primary Cardiologist: Ida Rogue, MD   Subjective   Needs new battery in hearing aid Taking PO GB tube in place    Inpatient Medications    Scheduled Meds: . amiodarone  200 mg Oral BID  . amoxicillin-clavulanate  1 tablet Oral Q12H  . apixaban  2.5 mg Oral BID  . furosemide  40 mg Oral Daily  . magnesium chloride  2 tablet Oral Daily  . mouth rinse  15 mL Mouth Rinse BID  . metoprolol tartrate  25 mg Oral Q8H  . pantoprazole  40 mg Oral Daily  . potassium chloride  40 mEq Oral Once  . rosuvastatin  20 mg Oral Daily  . sodium chloride flush  5 mL Intracatheter Q8H   Continuous Infusions: . sodium chloride 10 mL/hr at 08/19/18 1100   PRN Meds: acetaminophen, diphenhydrAMINE, ondansetron (ZOFRAN) IV, oxyCODONE, triamcinolone ointment, white petrolatum   Vital Signs    Vitals:   08/24/18 1500 08/24/18 2055 08/25/18 0640 08/25/18 0755  BP: 95/72 91/63 116/83 99/71  Pulse: 95 (!) 109 (!) 113 (!) 110  Resp:  (!) 22 20 20   Temp:  (!) 97.3 F (36.3 C) 97.6 F (36.4 C)   TempSrc:  Oral Axillary   SpO2:   93%   Weight:   55.8 kg   Height:        Intake/Output Summary (Last 24 hours) at 08/25/2018 0933 Last data filed at 08/25/2018 0641 Gross per 24 hour  Intake 370 ml  Output 150 ml  Net 220 ml   Last 3 Weights 08/25/2018 08/24/2018 08/23/2018  Weight (lbs) 123 lb 1.6 oz 125 lb 11.2 oz 128 lb 4.8 oz  Weight (kg) 55.838 kg 57.017 kg 58.196 kg      Telemetry    Afib, rate 90s-110s - Personally Reviewed  ECG    No new tracing- Personally Reviewed  Physical Exam   General: Well developed, frail, elderly, female in no acute distress Head: Eyes PERRLA, No xanthomas.   Normocephalic and atraumatic Lungs: diffuse dry rales bilaterally to auscultation. Heart: Irreg R&R S1 S2, without MRG.  Pulses are 2+ & equal. JVD 10 CM Abdomen: Bowel sounds are present, abdomen soft and  non-tender without masses or  hernias noted. Msk: Normal strength and tone for age. Extremities: No clubbing, cyanosis, 1+ edema.    Skin:  No rashes or lesions noted. Neuro: Alert and oriented X 3. Psych:  Good affect, responds appropriately  Labs    Chemistry Recent Labs  Lab 08/19/18 0310  08/23/18 0404 08/24/18 0400 08/25/18 0311  NA 135   < > 134* 137 136  K 3.8   < > 3.7 3.5 3.4*  CL 109   < > 104 104 105  CO2 20*   < > 20* 22 23  GLUCOSE 99   < > 104* 91 94  BUN 16   < > 19 23 20   CREATININE 0.97   < > 1.55* 1.48* 1.27*  CALCIUM 8.6*   < > 8.5* 8.7* 8.5*  PROT 5.2*  --   --   --   --   ALBUMIN 2.2*  --   --   --   --   AST 22  --   --   --   --   ALT 46*  --   --   --   --   ALKPHOS 93  --   --   --   --  BILITOT 1.2  --   --   --   --   GFRNONAA 50*   < > 28* 30* 36*  GFRAA 58*   < > 33* 35* 42*  ANIONGAP 6   < > 10 11 8    < > = values in this interval not displayed.     Hematology Recent Labs  Lab 08/22/18 0312 08/23/18 0404 08/24/18 0400  WBC 8.9 10.2 10.2  RBC 3.83* 3.84* 3.84*  HGB 10.6* 10.6* 10.7*  HCT 32.6* 32.8* 32.9*  MCV 85.1 85.4 85.7  MCH 27.7 27.6 27.9  MCHC 32.5 32.3 32.5  RDW 13.5 13.6 13.6  PLT 301 320 303     Radiology    No results found.  Cardiac Studies   Echocardiogram 08/17/2018 IMPRESSIONS 1. The left ventricle has normal systolic function, with an ejection fraction of 60-65%. The cavity size was normal. Left ventricular diastolic function could not be evaluated secondary to atrial fibrillation. No evidence of left ventricular regional  wall motion abnormalities. 2. The right ventricle has normal systolc function. The cavity was normal. There is no increase in right ventricular wall thickness. Right ventricular systolic pressure is mildly elevated with an estimated pressure of 43.5 mmHg. 3. Left atrial size was moderately dilated. 4. Right atrial size was moderately dilated. 5. Small pericardial effusion. 6. The  pericardial effusion is circumferential. 7. The aortic valve is tricuspid Mild thickening of the aortic valve Mild calcification of the aortic valve. 8. The inferior vena cava was dilated in size with <50% respiratory variability.  Patient Profile     85 y.o. female with a hx of paroxysmal asymptomaticatrial fibrillationon Eliquis,mild diastolic CHF,HTN, hiatal hernia, osteopenia, degenerative joint disease, scoliosis, hearing lossadmitted 03/14 with acute cholecystitis/ choledocholithiasis, complicated by persistentatrial fibrillationwith RVR, difficult to rate control due to low BP. Status post cholecystostomy drain on arrival, ERCP with stone extraction on 08/21/2018.  Assessment & Plan    1. AFib:  - Amio 200 mg qd and Lopressor will change to 50 mg bid  - Eliquis for anticoagulation - reluctant to try dig since renal function worsened w/ diuresis, but could use low dose.  2. CHF:  - was on IV Lasix 40 mg bid>>po Lasix 40 mg qd - still w/ mild volume overload, but Cr peak 1.55, now trending down.  - continue low dose PO lasix Cr/K improved  - make sure daily wts are standing.  For questions or updates, please contact Effie Please consult [email or web address] for contact info under        Signed, Jenkins Rouge, MD  08/25/2018, 9:33 AM

## 2018-08-26 LAB — BASIC METABOLIC PANEL
Anion gap: 6 (ref 5–15)
BUN: 15 mg/dL (ref 8–23)
CO2: 23 mmol/L (ref 22–32)
Calcium: 8.2 mg/dL — ABNORMAL LOW (ref 8.9–10.3)
Chloride: 105 mmol/L (ref 98–111)
Creatinine, Ser: 1.19 mg/dL — ABNORMAL HIGH (ref 0.44–1.00)
GFR calc Af Amer: 45 mL/min — ABNORMAL LOW (ref >60)
GFR calc non Af Amer: 39 mL/min — ABNORMAL LOW (ref >60)
Glucose, Bld: 93 mg/dL (ref 70–99)
POTASSIUM: 3.8 mmol/L (ref 3.5–5.1)
Sodium: 134 mmol/L — ABNORMAL LOW (ref 135–145)

## 2018-08-26 MED ORDER — METOPROLOL TARTRATE 50 MG PO TABS: 50.0000 mg | ORAL_TABLET | Freq: Two times a day (BID) | ORAL | 2 refills | Status: DC

## 2018-08-26 MED ORDER — AMIODARONE HCL 200 MG PO TABS: 200.0000 mg | ORAL_TABLET | Freq: Two times a day (BID) | ORAL | 2 refills | Status: DC

## 2018-08-26 MED ORDER — POTASSIUM CHLORIDE ER 10 MEQ PO TBCR: 10.0000 meq | EXTENDED_RELEASE_TABLET | Freq: Every day | ORAL | 2 refills | Status: DC

## 2018-08-26 MED ORDER — FUROSEMIDE 40 MG PO TABS: 40.0000 mg | ORAL_TABLET | Freq: Every day | ORAL | 2 refills | Status: DC

## 2018-08-26 NOTE — Discharge Summary (Signed)
Physician Discharge Summary  Kylie May AOZ:[id number] DOB: Aug 23, 1921 DOA: 08/15/2018  PCP: [name], MD  Admit date: 08/15/2018 Discharge date: 08/26/2018  Admitted From: Home Disposition:  Home with home health PT OT RN   Recommendations for Outpatient Follow-up:  1. Follow up with PCP in 1 week 2. Follow up with Cardiology Dr. Rockey Situ in 2 weeks 3. Follow up with IR in 6 weeks, they will call with appointment time 4. Follow up with Dr. Kae Heller or Dr. Dema Severin as scheduled on 09/22/2018 5. Flush drain daily with 10cc sterile saline  6. Please obtain BMP in 1 week  7. Follow up with Dr. Therisa Doyne in several weeks. Will need outpatient abdominal x-ray in a few weeks, if pancreatic stent still in place, will need EGD for removal  Discharge Condition: Stable CODE STATUS: Full  Diet recommendation: Heart healthy   Brief/Interim Summary: Kylie May is a 85 year old female who presented with severe epigastric pain radiated to her back. She does have significant past medical history for atrial fibrillation, chronic diastolic heart failure, mitral valve prolapse, dyslipidemia and esophageal strictures. She was initially admitted to [facility], her work-up included a CT of her abdomen which showed intra-and extrahepatic duct dilatation and gallbladder wall thickening but no stones. Patient was transferred to [facility] for ERCP. Patient underwent cholecystostomy tube placement by interventional radiology, ERCP showed dilation of the bile duct, cholelithiasis. Patient had stone removed,biliary sphincterectomy performed and a plastic stent was placed into the pancreatic duct.During hospital stay patient developed atrial fibrillation with rapid ventricular response complicated by decompensated heart failure.   Discharge Diagnoses:   Choledocholithiasis complicated with cholecystitis and septic shock sp cholecystostomy tube. No plans for  cholecystectomy on this admission. Completed 10 days of zosyn-->augmentin tx.  Follow-up with general surgery as an outpatient as well as GI.   Chronic atrial fibrillation complicated with RVR and acute decompensation of diastolic heart failure. Continue anticoagulation with apixaban, continue amiodarone, metoprolol.  Appreciate cardiology recommendations.  PO Lasix. Now converted to NSR prior to discharge.   AKI.  Lasix switched to PO, kidney function improving.    Discharge Instructions  Discharge Instructions    (HEART FAILURE PATIENTS) Call MD:  Anytime you have any of the following symptoms: 1) 3 pound weight gain in 24 hours or 5 pounds in 1 week 2) shortness of breath, with or without a dry hacking cough 3) swelling in the hands, feet or stomach 4) if you have to sleep on extra pillows at night in order to breathe.   Complete by:  As directed    Call MD for:  difficulty breathing, headache or visual disturbances   Complete by:  As directed    Call MD for:  extreme fatigue   Complete by:  As directed    Call MD for:  persistant dizziness or light-headedness   Complete by:  As directed    Call MD for:  persistant nausea and vomiting   Complete by:  As directed    Call MD for:  redness, tenderness, or signs of infection (pain, swelling, redness, odor or green/yellow discharge around incision site)   Complete by:  As directed    Call MD for:  severe uncontrolled pain   Complete by:  As directed    Call MD for:  temperature >100.4   Complete by:  As directed    Diet - low sodium heart healthy   Complete by:  As directed  Discharge instructions   Complete by:  As directed    You were cared for by a hospitalist during your hospital stay. If you have any questions about your discharge medications or the care you received while you were in the hospital after you are discharged, you can call the unit and ask to speak with the hospitalist on call if the hospitalist that took care of  you is not available. Once you are discharged, your primary care physician will handle any further medical issues. Please note that NO REFILLS for any discharge medications will be authorized once you are discharged, as it is imperative that you return to your primary care physician (or establish a relationship with a primary care physician if you do not have one) for your aftercare needs so that they can reassess your need for medications and monitor your lab values.   Increase activity slowly   Complete by:  As directed      Allergies as of 08/26/2018      Reactions   Metronidazole    Other reaction(s): Dizziness and giddiness (finding), Other (qualifier value) made things feel like they were vibrating like the bed and floor,also caused mild dizziness weakness   Penicillin G Rash      Medication List    STOP taking these medications   carvedilol 3.125 MG tablet Commonly known as:  COREG   flecainide 50 MG tablet Commonly known as:  TAMBOCOR     TAKE these medications   amiodarone 200 MG tablet Commonly known as:  PACERONE Take 1 tablet (200 mg total) by mouth 2 (two) times daily.   apixaban 2.5 MG Tabs tablet Commonly known as:  ELIQUIS Take 2.5 mg by mouth 2 (two) times daily.   furosemide 40 MG tablet Commonly known as:  LASIX Take 1 tablet (40 mg total) by mouth daily.   magnesium chloride 64 MG Tbec SR tablet Commonly known as:  SLOW-MAG Take 2 tablets (128 mg total) by mouth daily.   metoprolol tartrate 50 MG tablet Commonly known as:  LOPRESSOR Take 1 tablet (50 mg total) by mouth 2 (two) times daily.   omeprazole 40 MG capsule Commonly known as:  PRILOSEC Take 1 capsule (40 mg total) by mouth daily.   potassium chloride 10 MEQ tablet Commonly known as:  Klor-Con 10 Take 1 tablet (10 mEq total) by mouth daily. With lasix.   rosuvastatin 20 MG tablet Commonly known as:  CRESTOR Take 1 tablet (20 mg total) by mouth daily.   triamcinolone ointment 0.1  % Commonly known as:  KENALOG Apply 1 application topically 2 (two) times daily as needed (rash).      Follow-up Information    Ileana Roup, MD. Go on 09/22/2018.   Specialty:  General Surgery Why:  Call to confirm appointment time. Please arrive 30 min prior to appointment time. Bring ID and Environmental consultant.  Contact information: [place] [address] [phone]        Sandi Mariscal, MD Follow up in 6 week(s).   Specialties:  Interventional Radiology, Radiology Why:  pt will hear from scheduler for [facility] follow up; call [phone] if any needs; need to flush drain daily 10 cc sterile saline Contact information: [place] [address] [phone]        [place] [place] Follow up.   Specialty:  Home Health Services Why:  RN, PT, OT Contact information: [address]  [address] [place] [address] [phone]        [name], MD. Schedule an appointment as soon as possible for a visit in 1 week(s).   Specialty:  Family Medicine Contact information: [address] [place] [address] [phone]        Minna Merritts, MD. Schedule an appointment as soon as possible for a visit in 2 week(s).   Specialty:  Cardiology Contact information: [place] [address] [phone]        [name], MD. Schedule an appointment as soon as possible for a visit in 3 week(s).   Specialty:  Gastroenterology Contact information: [address] [place] [address] [phone]          Allergies  Allergen Reactions  . Metronidazole     Other reaction(s): Dizziness and giddiness (finding), Other (qualifier value) made things feel like they were vibrating like the bed and floor,also caused mild dizziness weakness  . Penicillin G Rash    Consultations:  General Surgery  GI  IR  Cardiology     Procedures/Studies: Dg Chest 2 View  Result Date: 08/14/2018 CLINICAL DATA:  85 year old female with mid chest pain. EXAM: CHEST - 2 VIEW COMPARISON:  CTA chest 07/05/2018 and earlier. FINDINGS: Moderate scoliosis. Stable mild cardiomegaly and tortuosity of the thoracic aorta. Other mediastinal contours are within normal limits. Visualized tracheal air column is within normal limits. No pneumothorax, pulmonary edema, pleural effusion or acute pulmonary opacity. Negative visible bowel gas pattern. IMPRESSION: Stable.  No acute cardiopulmonary abnormality. Electronically Signed   By: Genevie Ann M.D.   On: 08/14/2018 21:17   Ct Abdomen Pelvis W Contrast  Result Date: 08/15/2018 CLINICAL DATA:  Upper abdominal pain and nausea. History of gallstones and atrial fibrillation. EXAM: CT ABDOMEN AND PELVIS WITH CONTRAST TECHNIQUE: Multidetector CT imaging of the abdomen and pelvis was performed using the standard protocol following bolus administration of intravenous contrast. CONTRAST:  6mL OMNIPAQUE IOHEXOL 300 MG/ML  SOLN COMPARISON:  MRI abdomen 07/06/2018. CT abdomen and pelvis 09/16/2017 FINDINGS: Lower chest: Small left pleural effusion. Atelectasis in both lung bases. Cardiac enlargement. Small esophageal hiatal hernia. Residual contrast material in the lower esophagus may represent reflux or dysmotility. Hepatobiliary: There is prominent intra and extrahepatic bile duct dilatation, increasing since the previous studies. Small stones are demonstrated in the gallbladder. Gallbladder wall is thickened. Gallbladder is mildly distended. Given these findings, this raises suspicion for an occult common bile duct stone although no radiopaque stone is demonstrated. Pancreas: Unremarkable. No pancreatic ductal dilatation or surrounding inflammatory changes. Spleen: Normal in size without focal abnormality. Adrenals/Urinary Tract: Adrenal glands are unremarkable. Kidneys are normal, without renal calculi, focal  lesion, or hydronephrosis. Bladder is unremarkable. Stomach/Bowel: Stomach, small bowel, and colon are not abnormally distended. Prominence of gastric rugal folds is unchanged since prior study and could represent gastritis. Scattered stool throughout the colon. Static colonic diverticula. No wall thickening or inflammatory changes. No evidence of diverticulitis. Vascular/Lymphatic: Aortic atherosclerosis. No enlarged abdominal or pelvic lymph nodes. Reproductive: Uterus and bilateral adnexa are unremarkable. Other: No free air or free fluid in the abdomen. Abdominal wall musculature appears intact. Musculoskeletal: Lumbar scoliosis convex towards the left. Degenerative changes in the lumbar spine. Old fracture deformities of the right superior and inferior pubic rami. Old compression deformity of L1 vertebra. IMPRESSION: 1. Intra and extrahepatic bile duct dilatation with gallbladder wall thickening and small stones. No radiopaque common bile duct stones  are identified although changes are worrisome for an occult obstructing common bile duct stone. Consider ultrasound follow-up. 2. Small esophageal hiatal hernia with residual contrast material in the lower esophagus suggesting reflux or dysmotility. 3. Small left pleural effusion with basilar atelectasis. 4. Prominence of gastric rugal folds is unchanged since prior study and could indicate chronic gastritis. 5. Diverticulosis of the colon without evidence of diverticulitis. 6. Old fracture deformities of the right pubic rami and L1 vertebra. Lumbar scoliosis and degenerative changes. Aortic Atherosclerosis (ICD10-I70.0). Electronically Signed   By: Lucienne Capers M.D.   On: 08/15/2018 00:03   Mr 3d Recon At Scanner  Result Date: 08/15/2018 CLINICAL DATA:  Abnormal LFTs, cholelithiasis EXAM: MRI ABDOMEN WITHOUT AND WITH CONTRAST (INCLUDING MRCP) TECHNIQUE: Multiplanar multisequence MR imaging of the abdomen was performed both before and after the  administration of intravenous contrast. Heavily T2-weighted images of the biliary and pancreatic ducts were obtained, and three-dimensional MRCP images were rendered by post processing. CONTRAST:  4 mL Gadovist IV COMPARISON:  CT abdomen/pelvis dated 08/14/2018 and 09/16/2017. FINDINGS: Motion degraded images. Lower chest: Mild patchy opacities at the lung bases, likely dependent atelectasis. Hepatobiliary: Liver is within normal limits. No suspicious enhancing hepatic lesions. Layering small gallstones. Mild gallbladder distension. No pericholecystic fluid/inflammatory changes suggest acute cholecystitis. Mild intrahepatic ductal dilatation. Dilated common duct, measuring 14 mm, new from 2019. Suspected 7 mm distal CBD stone just above the ampulla, although poorly visualized/evaluated. Pancreas:  Within normal limits. Spleen:  Within normal limits. Adrenals/Urinary Tract:  Adrenal glands are within normal limits. 8 mm right upper pole renal cyst. Left kidney is within normal limits. No hydronephrosis. Stomach/Bowel: Stomach is notable for a tiny hiatal hernia. Visualized bowel is grossly unremarkable. Vascular/Lymphatic:  No evidence of abdominal aortic aneurysm. No suspicious abdominal lymphadenopathy. Other:  No abdominal ascites. Musculoskeletal: Lower thoracolumbar levoscoliosis with mild degenerative changes. IMPRESSION: Cholelithiasis, without associated inflammatory changes suggest acute cholecystitis. Intrahepatic/extrahepatic ductal dilatation. Common duct measures 14 mm. These findings are new/progressive from 2019. Suspected 7 mm distal CBD stone just above the ampulla, poorly visualized/evaluated given motion degradation. Electronically Signed   By: Julian Hy M.D.   On: 08/15/2018 04:13   Ir Perc Cholecystostomy  Result Date: 08/16/2018 INDICATION: Concern for occlusive choledocholithiasis, now with hemodynamic instability and poor candidate for endoscopy. After stabilization in the ICU,  request made for placement of a percutaneous biliary drainage catheter for infection source control purposes. EXAM: ULTRASOUND AND FLUOROSCOPIC-GUIDED CHOLECYSTOSTOMY TUBE PLACEMENT COMPARISON:  MRCP - 08/15/2018; 07/06/2018; CT abdomen and pelvis - 08/14/2018 MEDICATIONS: The patient is currently admitted to the hospital and on intravenous antibiotics. Antibiotics were administered within an appropriate time frame prior to skin puncture. ANESTHESIA/SEDATION: Moderate (conscious) sedation was employed during this procedure. A total of Versed 1.5 mg and Fentanyl 50 mcg was administered intravenously. Moderate Sedation Time: 63 minutes. The patient's level of consciousness and vital signs were monitored continuously by radiology nursing throughout the procedure under my direct supervision. CONTRAST:  14mL OMNIPAQUE IOHEXOL 300 MG/ML SOLN - administered into the biliary tree, hepatic parenchyma and gallbladder fossa. FLUOROSCOPY TIME:  13 minutes, 48 seconds (55 mGy) COMPLICATIONS: None immediate. PROCEDURE: Informed written consent was obtained from the patient and the patient's family after a discussion of the risks, benefits and alternatives to treatment. Questions regarding the procedure were encouraged and answered. A timeout was performed prior to the initiation of the procedure. The right upper abdominal quadrant was prepped and draped in the usual sterile fashion, and a sterile drape  was applied covering the operative field. Maximum barrier sterile technique with sterile gowns and gloves were used for the procedure. A timeout was performed prior to the initiation of the procedure. Local anesthesia was provided with 1% lidocaine with epinephrine. Ultrasound evaluation of the abdomen was negative for any significant dilatation of the peripheral aspect of the biliary tree. Note is made of marked gallbladder wall thickening as well as the presence of known cholelithiasis and gallbladder sludge. Multiple attempts  were made to cannulate the biliary tree both from right and left-sided approaches. At one point, there is minimal opacification of the central aspect of the right biliary tree with eventual opacification of the common bile and cystic ducts. Given patient's hemodynamic instability as well as lack of any significant intrahepatic biliary ductal dilatation and patency of the cystic duct the decision was made to proceed with cholecystostomy tube placement. As such, utilizing a transhepatic approach, a 22 gauge needle was advanced into the gallbladder under direct ultrasound guidance. An ultrasound image was saved for documentation purposes. Appropriate intraluminal puncture was confirmed with the efflux of bile and advancement of an 0.018 wire into the gallbladder lumen. The needle was exchanged for an [place] set. A small amount of contrast was injected to confirm appropriate intraluminal positioning. Over a short Amplatz wire, a 10.2-French Cook cholecystomy tube was advanced into the gallbladder fossa, coiled and locked. Bile was aspirated and a small amount of contrast was injected as several post procedural spot radiographic images were obtained in various obliquities. The catheter was secured to the skin with suture, connected to a drainage bag and a dressing was placed. The patient tolerated the procedure well without immediate post procedural complication. FINDINGS: Sonographic evaluation of the abdomen is negative for any significant intrahepatic biliary duct dilatation. Note is made of gallbladder wall thickening pericholecystic fluid as well as multiple radiopaque gallstones or biliary sludge. Limited percutaneous cholangiogram demonstrates opacification of a moderately dilated common bile duct as well as opacification of the cystic duct. There is no definitive passage of contrast to the distal aspect of the CBD. Given lack of any significant intrahepatic biliary ductal dilatation, as well as patency of the  cystic duct and abnormal appearance of the gallbladder, the decision was made to proceed with cholecystostomy tube placement. After image guided placement, the 10 French percutaneous cholecystostomy tube is appropriately positioned with end coiled and locked within the lumen of the gallbladder. IMPRESSION: 1. Attempted though ultimately unsuccessful placement of an internal/external biliary drainage catheter secondary to lack of any significant dilatation of the intrahepatic biliary system. 2. Given abnormal sonographic appearance of the gallbladder as well as limited percutaneous cholangiogram demonstrating patency of the cystic duct, the decision was made to proceed with image guided cholecystostomy tube placement for infection source control purposes. 3. Successful ultrasound and fluoroscopic guided placement of a 10.2 French cholecystostomy tube. PLAN: - Continued resuscitative measures by the ICU staff. - Maintain chole tube to gravity bag. - Obtain daily CMP to evaluate bilirubin trend. - Diagnostic percutaneous cholangiogram could be performed via the existing cholecystostomy tube following the resolution of acute symptoms. Above findings discussed with Dr. Nelda Marseille at the time of procedure completion. Electronically Signed   By: Sandi Mariscal M.D.   On: 08/16/2018 19:51   Dg Ercp Biliary & Pancreatic Ducts  Result Date: 08/21/2018 CLINICAL DATA:  Balloon stone retrieval EXAM: ERCP TECHNIQUE: Multiple spot images obtained with the fluoroscopic device and submitted for interpretation post-procedure. FLUOROSCOPY TIME:  Fluoroscopy Time:  9 minutes  and 18 seconds Radiation Exposure Index (if provided by the fluoroscopic device): Number of Acquired Spot Images: 0 COMPARISON:  None. FINDINGS: Initial imaging demonstrates several filling defects in the common bile duct. Balloon stone retrieval is documented. The final image demonstrates clearance of the filling defects. IMPRESSION: See above. These images were  submitted for radiologic interpretation only. Please see the procedural report for the amount of contrast and the fluoroscopy time utilized. Electronically Signed   By: Marybelle Killings M.D.   On: 08/21/2018 11:32   Mr Abdomen Mrcp Moise Boring Contast  Result Date: 08/15/2018 CLINICAL DATA:  Abnormal LFTs, cholelithiasis EXAM: MRI ABDOMEN WITHOUT AND WITH CONTRAST (INCLUDING MRCP) TECHNIQUE: Multiplanar multisequence MR imaging of the abdomen was performed both before and after the administration of intravenous contrast. Heavily T2-weighted images of the biliary and pancreatic ducts were obtained, and three-dimensional MRCP images were rendered by post processing. CONTRAST:  4 mL Gadovist IV COMPARISON:  CT abdomen/pelvis dated 08/14/2018 and 09/16/2017. FINDINGS: Motion degraded images. Lower chest: Mild patchy opacities at the lung bases, likely dependent atelectasis. Hepatobiliary: Liver is within normal limits. No suspicious enhancing hepatic lesions. Layering small gallstones. Mild gallbladder distension. No pericholecystic fluid/inflammatory changes suggest acute cholecystitis. Mild intrahepatic ductal dilatation. Dilated common duct, measuring 14 mm, new from 2019. Suspected 7 mm distal CBD stone just above the ampulla, although poorly visualized/evaluated. Pancreas:  Within normal limits. Spleen:  Within normal limits. Adrenals/Urinary Tract:  Adrenal glands are within normal limits. 8 mm right upper pole renal cyst. Left kidney is within normal limits. No hydronephrosis. Stomach/Bowel: Stomach is notable for a tiny hiatal hernia. Visualized bowel is grossly unremarkable. Vascular/Lymphatic:  No evidence of abdominal aortic aneurysm. No suspicious abdominal lymphadenopathy. Other:  No abdominal ascites. Musculoskeletal: Lower thoracolumbar levoscoliosis with mild degenerative changes. IMPRESSION: Cholelithiasis, without associated inflammatory changes suggest acute cholecystitis. Intrahepatic/extrahepatic ductal  dilatation. Common duct measures 14 mm. These findings are new/progressive from 2019. Suspected 7 mm distal CBD stone just above the ampulla, poorly visualized/evaluated given motion degradation. Electronically Signed   By: Julian Hy M.D.   On: 08/15/2018 04:13   Ir Cholangiogram Existing Tube  Result Date: 08/18/2018 INDICATION: Biliary ductal dilatation status post decompressive cholecystostomy catheter placement. EXAM: CHOLANGIOGRAM THROUGH EXISTING CATHETER MEDICATIONS: None indicated ANESTHESIA/SEDATION: None required FLUOROSCOPY TIME:  42 seconds; 5 mGy COMPLICATIONS: None immediate. PROCEDURE: Informed written consent was obtained from the patient and family after a thorough discussion of the procedural risks, benefits and alternatives. All questions were addressed. Maximal Sterile Barrier Technique was utilized including caps, mask, sterile gowns, sterile gloves, sterile drape, hand hygiene and skin antiseptic. A timeout was performed prior to the initiation of the procedure. Scout radiograph shows stable position of the cholecystostomy catheter. Contrast injection shows normal filling of the gallbladder. Cystic duct is patent. No filling defects in the CBD. There is a short segment eccentric shelf-like area of high-grade stenosis in the distal CBD just proximal to the ampulla. A small amount of contrast traverses this area into the duodenum. Partial opacification of the pancreatic duct in the head of the pancreas, which is nondilated. IMPRESSION: 1. 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 Consider endoscopic evaluation and brush biopsy to exclude neoplasm. Electronically Signed   By: Lucrezia Europe M.D.   On: 08/18/2018 16:49    Echo 08/17/2018 IMPRESSIONS  1. The left ventricle has normal systolic function, with an ejection fraction of 60-65%. The cavity size was normal. Left ventricular diastolic function could not be evaluated secondary to  atrial fibrillation. No  evidence of left ventricular regional  wall motion abnormalities.  2. The right ventricle has normal systolc function. The cavity was normal. There is no increase in right ventricular wall thickness. Right ventricular systolic pressure is mildly elevated with an estimated pressure of 43.5 mmHg.  3. Left atrial size was moderately dilated.  4. Right atrial size was moderately dilated.  5. Small pericardial effusion.  6. The pericardial effusion is circumferential.  7. The aortic valve is tricuspid Mild thickening of the aortic valve Mild calcification of the aortic valve.  8. The inferior vena cava was dilated in size with <50% respiratory variability.    Discharge Exam: Vitals:   08/25/18 2123 08/26/18 0511  BP: 110/63 117/70  Pulse: 70 72  Resp: (!) 25 17  Temp: 97.7 F (36.5 C) 97.8 F (36.6 C)  SpO2: 96% 100%    General: Pt is alert, awake, not in acute distress, hard of hearing  Cardiovascular: RRR, S1/S2 +, no rubs, no gallops Respiratory: CTA bilaterally, no wheezing, no rhonchi Abdominal: Soft, NT, ND, bowel sounds +, biliary drain in place Extremities: no edema, no cyanosis    The results of significant diagnostics from this hospitalization (including imaging, microbiology, ancillary and laboratory) are listed below for reference.     Microbiology: Recent Results (from the past 240 hour(s))  Culture, blood (Routine X 2) w Reflex to ID Panel     Status: None   Collection Time: 08/16/18  3:38 PM  Result Value Ref Range Status   Specimen Description BLOOD RIGHT HAND  Final   Special Requests AEROBIC BOTTLE ONLY Blood Culture adequate volume  Final   Culture   Final    NO GROWTH 5 DAYS Performed at [facility], [address], [place], [place] [address]    Report Status 08/21/2018 FINAL  Final  Culture, blood (Routine X 2) w Reflex to ID Panel     Status: None   Collection Time: 08/16/18  3:44 PM  Result Value Ref Range Status   Specimen Description BLOOD  LEFT HAND  Final   Special Requests AEROBIC BOTTLE ONLY Blood Culture adequate volume  Final   Culture   Final    NO GROWTH 5 DAYS Performed at [facility], [place] [address], [place], [place] [address]    Report Status 08/21/2018 FINAL  Final  MRSA PCR Screening     Status: None   Collection Time: 08/16/18  4:53 PM  Result Value Ref Range Status   MRSA by PCR NEGATIVE NEGATIVE Final    Comment:        The GeneXpert MRSA Assay (FDA approved for NASAL specimens only), is one component of a comprehensive MRSA colonization surveillance program. It is not intended to diagnose MRSA infection nor to guide or monitor treatment for MRSA infections. Performed at [facility], [place] [address], [place], [place] [address]   Urine Culture     Status: Abnormal   Collection Time: 08/16/18  8:37 PM  Result Value Ref Range Status   Specimen Description URINE, CLEAN CATCH  Final   Special Requests NONE  Final   Culture (A)  Final    <10,000 COLONIES/mL INSIGNIFICANT GROWTH Performed at [facility], [place] [address] [place], [place] [address]    Report Status 08/18/2018 FINAL  Final     Labs: BNP (last 3 results) Recent Labs    08/15/18 2040  BNP 7,494.4*   Basic Metabolic Panel: Recent  Labs  Lab 08/22/18 0312 08/23/18 0404 08/24/18 0400 08/25/18 0311 08/26/18 0320  NA 132* 134* 137 136 134*  K 4.2 3.7 3.5 3.4* 3.8  CL 104 104 104 105 105  CO2 19* 20* 22 23 23   GLUCOSE 139* 104* 91 94 93  BUN 14 19 23 20 15   CREATININE 1.27* 1.55* 1.48* 1.27* 1.19*  CALCIUM 8.6* 8.5* 8.7* 8.5* 8.2*  MG  --  1.6*  --  1.7  --    Liver Function Tests: No results for input(s): AST, ALT, ALKPHOS, BILITOT, PROT, ALBUMIN in the last 168 hours. No results for input(s): LIPASE, AMYLASE in the last 168 hours. No results for input(s): AMMONIA in the last 168 hours. CBC: Recent Labs  Lab 08/22/18 0312 08/23/18 0404 08/24/18 0400  WBC 8.9 10.2 10.2  NEUTROABS  --  6.1 6.5  HGB  10.6* 10.6* 10.7*  HCT 32.6* 32.8* 32.9*  MCV 85.1 85.4 85.7  PLT 301 320 303   Cardiac Enzymes: No results for input(s): CKTOTAL, CKMB, CKMBINDEX, TROPONINI in the last 168 hours. BNP: Invalid input(s): POCBNP CBG: No results for input(s): GLUCAP in the last 168 hours. D-Dimer No results for input(s): DDIMER in the last 72 hours. Hgb A1c No results for input(s): HGBA1C in the last 72 hours. Lipid Profile No results for input(s): CHOL, HDL, LDLCALC, TRIG, CHOLHDL, LDLDIRECT in the last 72 hours. Thyroid function studies No results for input(s): TSH, T4TOTAL, T3FREE, THYROIDAB in the last 72 hours.  Invalid input(s): FREET3 Anemia work up No results for input(s): VITAMINB12, FOLATE, FERRITIN, TIBC, IRON, RETICCTPCT in the last 72 hours. Urinalysis    Component Value Date/Time   COLORURINE AMBER (A) 08/16/2018 2037   APPEARANCEUR HAZY (A) 08/16/2018 2037   APPEARANCEUR Clear 09/16/2014 0859   LABSPEC 1.040 (H) 08/16/2018 2037   LABSPEC 1.016 09/16/2014 0859   PHURINE 5.0 08/16/2018 2037   GLUCOSEU NEGATIVE 08/16/2018 2037   GLUCOSEU Negative 09/16/2014 0859   HGBUR NEGATIVE 08/16/2018 2037   HGBUR negative 09/15/2008 1336   BILIRUBINUR NEGATIVE 08/16/2018 2037   BILIRUBINUR negative 07/16/2017 1550   BILIRUBINUR Negative 09/16/2014 0859   KETONESUR 20 (A) 08/16/2018 2037   PROTEINUR 30 (A) 08/16/2018 2037   UROBILINOGEN 0.2 07/16/2017 1550   UROBILINOGEN 0.2 09/15/2008 1336   NITRITE NEGATIVE 08/16/2018 2037   LEUKOCYTESUR NEGATIVE 08/16/2018 2037   LEUKOCYTESUR Negative 09/16/2014 0859   Sepsis Labs Invalid input(s): PROCALCITONIN,  WBC,  LACTICIDVEN Microbiology Recent Results (from the past 240 hour(s))  Culture, blood (Routine X 2) w Reflex to ID Panel     Status: None   Collection Time: 08/16/18  3:38 PM  Result Value Ref Range Status   Specimen Description BLOOD RIGHT HAND  Final   Special Requests AEROBIC BOTTLE ONLY Blood Culture adequate volume  Final    Culture   Final    NO GROWTH 5 DAYS Performed at [facility], [place] [address], [place], [place] [address]    Report Status 08/21/2018 FINAL  Final  Culture, blood (Routine X 2) w Reflex to ID Panel     Status: None   Collection Time: 08/16/18  3:44 PM  Result Value Ref Range Status   Specimen Description BLOOD LEFT HAND  Final   Special Requests AEROBIC BOTTLE ONLY Blood Culture adequate volume  Final   Culture   Final    NO GROWTH 5 DAYS Performed at [facility], [place] [address], [place], [place] [address]    Report  Status 08/21/2018 FINAL  Final  MRSA PCR Screening     Status: None   Collection Time: 08/16/18  4:53 PM  Result Value Ref Range Status   MRSA by PCR NEGATIVE NEGATIVE Final    Comment:        The GeneXpert MRSA Assay (FDA approved for NASAL specimens only), is one component of a comprehensive MRSA colonization surveillance program. It is not intended to diagnose MRSA infection nor to guide or monitor treatment for MRSA infections. Performed at [facility], [place] [address], [place], [place] [address]   Urine Culture     Status: Abnormal   Collection Time: 08/16/18  8:37 PM  Result Value Ref Range Status   Specimen Description URINE, CLEAN CATCH  Final   Special Requests NONE  Final   Culture (A)  Final    <10,000 COLONIES/mL INSIGNIFICANT GROWTH Performed at [facility], [place] [address], [place], [place] [address]    Report Status 08/18/2018 FINAL  Final     Patient was seen and examined on the day of discharge and was found to be in stable condition. Time coordinating discharge: 40 minutes including assessment and coordination of care, as well as examination of the patient.   SIGNED:  Dessa Phi, DO [facility] [email or web address] 08/26/2018, 9:49 AM

## 2018-08-26 NOTE — Progress Notes (Signed)
Pt noted to be in NSR on telemetry. Confirmed by EKG. EKG placed on chart.

## 2018-08-26 NOTE — Consult Note (Signed)
   [facility] CM Inpatient Consult   08/26/2018  Kylie May 08/03/1921 [id number]   Patient screened for medium risk score for unplanned readmission however was a readmission within 7 days and long length of stay. Spoke with inpatient Transition of Care [facility] regarding  [place] Management services with Medicare/Next Gen. Patient is for home with home health with [facility].   Patient was hospitalized on  08/15/2018 with for Cholelithiasis, without associated inflammatory changes suggest acute cholecystitis.Intrahepatic/extrahepatic ductal dilatation. Common duct measures 14 mm. S/P Biliary Drain.   No community follow up need for [facility] Care Management.  Will assign to General EMMI calls as her primary care is Dr. Tommi Rumps at [facility].  This office provides the transition of care follow up.  For questions contact:   Natividad Brood, RN BSN [facility] Liaison  [phone] business mobile phone Toll free office [phone]

## 2018-08-26 NOTE — Plan of Care (Signed)
  Problem: Education: Goal: Knowledge of General Education information will improve Description Including pain rating scale, medication(s)/side effects and non-pharmacologic comfort measures Outcome: Progressing   Problem: Health Behavior/Discharge Planning: Goal: Ability to manage health-related needs will improve Outcome: Progressing   Problem: Clinical Measurements: Goal: Ability to maintain clinical measurements within normal limits will improve Outcome: Progressing Goal: Will remain free from infection Outcome: Progressing Goal: Cardiovascular complication will be avoided Outcome: Progressing   Problem: Clinical Measurements: Goal: Respiratory complications will improve Outcome: Completed/Met

## 2018-08-26 NOTE — TOC Transition Note (Signed)
Transition of Care [facility]) - CM/SW Discharge Note   Patient Details  Name: NATHAN STALLWORTH MRN: [id number] Date of Birth: 09-15-1921  [facility] CM/SW Contact:  Bethena Roys, RN Phone Number: 08/26/2018, 10:33 AM   Clinical Narrative:   Plan for transition home today. CM did reach out to [facility] to make her aware that patient will transition home today. Pt has Biliary Drain and Diplomatic Services operational officer to supply patient with some saline syringes for home use. Agency will then supply thereafter. Grandson to provide transportation home. Home medications reviewed and patient was taking Eliquis prior to hospitalization.  No further needs from CM at this time.     Final next level of care: [place] Barriers to Discharge: No Barriers Identified   Patient Goals and CMS Choice Patient states their goals for this hospitalization and ongoing recovery are:: get better CMS Medicare.gov Compare Post Acute Care list provided to:: Patient Represenative (must comment)(grandson) Choice offered to / list presented to : Adult Children  Discharge Placement                       Discharge Plan and Services In-house Referral: NA Discharge Planning Services: CM Consult Post Acute Care Choice: Home Health          DME Arranged: N/A DME Agency: NA HH Arranged: RN, PT, OT [place] Agency: [facility]   Social Determinants of Health (SDOH) Interventions     Readmission Risk Interventions No flowsheet data found.

## 2018-08-26 NOTE — Progress Notes (Addendum)
Patient ID: Kylie May, female   DOB: 11-Jan-1922, 85 y.o.   MRN: [id number]   IR Note via telephone secondary new regulations  Perc chole drain placed in IR 3/15 Cholangiogram 3/17 ERCP and stent placement; stone retraction 3/21  Afeb; wbc wnl  Flushes easily per RN Site is clean and dry NT no bleeding  Pt was to DC yesterday-- but developed Afib-- DC delayed Converted last night per RN Plan for DC today  Upon DC (maybe today)-- will need to flush daily with 5-10 cc sterile saline Record OP  IR will see pt in OP Clinic 6 weeks or so We will call pt with follow up date and time Plan per Dr Georgiann Cocker

## 2018-08-26 NOTE — Progress Notes (Signed)
Progress Note  Patient Name: TEIGEN PARSLOW Date of Encounter: 08/26/2018  Primary Cardiologist: Ida Rogue, MD   Subjective   No complaints In NSR this am    Inpatient Medications    Scheduled Meds: . amiodarone  200 mg Oral BID  . amoxicillin-clavulanate  1 tablet Oral Q12H  . apixaban  2.5 mg Oral BID  . furosemide  40 mg Oral Daily  . magnesium chloride  2 tablet Oral Daily  . mouth rinse  15 mL Mouth Rinse BID  . metoprolol tartrate  50 mg Oral BID  . pantoprazole  40 mg Oral Daily  . rosuvastatin  20 mg Oral Daily  . sodium chloride flush  5 mL Intracatheter Q8H   Continuous Infusions: . sodium chloride 10 mL/hr at 08/19/18 1100   PRN Meds: acetaminophen, diphenhydrAMINE, ondansetron (ZOFRAN) IV, oxyCODONE, triamcinolone ointment, white petrolatum   Vital Signs    Vitals:   08/25/18 0755 08/25/18 2123 08/26/18 0511 08/26/18 0513  BP: 99/71 110/63 117/70   Pulse: (!) 110 70 72   Resp: 20 (!) 25 17   Temp:  97.7 F (36.5 C) 97.8 F (36.6 C)   TempSrc:  Oral Oral   SpO2:  96% 100%   Weight:    54.8 kg  Height:        Intake/Output Summary (Last 24 hours) at 08/26/2018 0827 Last data filed at 08/26/2018 0600 Gross per 24 hour  Intake 605 ml  Output 150 ml  Net 455 ml   Last 3 Weights 08/26/2018 08/25/2018 08/24/2018  Weight (lbs) 120 lb 14.4 oz 123 lb 1.6 oz 125 lb 11.2 oz  Weight (kg) 54.84 kg 55.838 kg 57.017 kg      Telemetry    Afib, rate 90s-110s - Personally Reviewed  ECG    No new tracing- Personally Reviewed  Physical Exam   General: Well developed, frail, elderly, female in no acute distress Head: Eyes PERRLA, No xanthomas.   Normocephalic and atraumatic Lungs: diffuse dry rales bilaterally to auscultation. Heart: Irreg R&R S1 S2, without MRG.  Pulses are 2+ & equal. JVD 10 CM Abdomen: Bowel sounds are present, abdomen soft and non-tender without masses or  hernias noted. Msk: Normal strength and tone for age. Extremities:  No clubbing, cyanosis, 1+ edema.    Skin:  No rashes or lesions noted. Neuro: Alert and oriented X 3. Psych:  Good affect, responds appropriately  Labs    Chemistry Recent Labs  Lab 08/24/18 0400 08/25/18 0311 08/26/18 0320  NA 137 136 134*  K 3.5 3.4* 3.8  CL 104 105 105  CO2 22 23 23   GLUCOSE 91 94 93  BUN 23 20 15   CREATININE 1.48* 1.27* 1.19*  CALCIUM 8.7* 8.5* 8.2*  GFRNONAA 30* 36* 39*  GFRAA 35* 42* 45*  ANIONGAP 11 8 6      Hematology Recent Labs  Lab 08/22/18 0312 08/23/18 0404 08/24/18 0400  WBC 8.9 10.2 10.2  RBC 3.83* 3.84* 3.84*  HGB 10.6* 10.6* 10.7*  HCT 32.6* 32.8* 32.9*  MCV 85.1 85.4 85.7  MCH 27.7 27.6 27.9  MCHC 32.5 32.3 32.5  RDW 13.5 13.6 13.6  PLT 301 320 303     Radiology    No results found.  Cardiac Studies   Echocardiogram 08/17/2018 IMPRESSIONS 1. The left ventricle has normal systolic function, with an ejection fraction of 60-65%. The cavity size was normal. Left ventricular diastolic function could not be evaluated secondary to atrial fibrillation. No evidence of left  ventricular regional  wall motion abnormalities. 2. The right ventricle has normal systolc function. The cavity was normal. There is no increase in right ventricular wall thickness. Right ventricular systolic pressure is mildly elevated with an estimated pressure of 43.5 mmHg. 3. Left atrial size was moderately dilated. 4. Right atrial size was moderately dilated. 5. Small pericardial effusion. 6. The pericardial effusion is circumferential. 7. The aortic valve is tricuspid Mild thickening of the aortic valve Mild calcification of the aortic valve. 8. The inferior vena cava was dilated in size with <50% respiratory variability.  Patient Profile     85 y.o. female with a hx of paroxysmal asymptomaticatrial fibrillationon Eliquis,mild diastolic CHF,HTN, hiatal hernia, osteopenia, degenerative joint disease, scoliosis, hearing lossadmitted 03/14 with  acute cholecystitis/ choledocholithiasis, complicated by persistentatrial fibrillationwith RVR, difficult to rate control due to low BP. Status post cholecystostomy drain on arrival, ERCP with stone extraction on 08/21/2018.  Assessment & Plan    1. AFib:  - Amio 200 mg qd and Lopressor will change to 50 mg bid  - Eliquis for anticoagulation - has converted to NSR   2. CHF:  - was on IV Lasix 40 mg bid>>po Lasix 40 mg qd - still w/ mild volume overload, but Cr peak 1.55, now trending down.  - continue low dose PO lasix Cr/K improved  - make sure daily wts are standing.  For questions or updates, please contact [place] Please consult [email or web address] for contact info under    will sign off and arrange outpatient f/u with Dr Rockey Situ     Signed, Jenkins Rouge, MD  08/26/2018, 8:27 AM

## 2018-08-27 ENCOUNTER — Telehealth: Payer: Self-pay

## 2018-08-27 DIAGNOSIS — K8042 Calculus of bile duct with acute cholecystitis without obstruction: Secondary | ICD-10-CM

## 2018-08-27 NOTE — Telephone Encounter (Signed)
Order faxed.

## 2018-08-27 NOTE — Telephone Encounter (Signed)
BMP order in, I placed it as external collect and lab as [facility] -- not sure what lab they use but if this is wrong, we can change it

## 2018-08-27 NOTE — Telephone Encounter (Addendum)
Transition Care Management Follow-up Telephone Call   Date discharged? 08/26/18. Information obtained from grandson Ysidro Evert), HIPAA compliant.   How have you been since you were released from the hospital? Stable, yet weak/fatigued. She is doing better overall. Walker encouraged for ambulation. Appetite decreased, monitoring closely.  Denies weight gain the last 24 hours, shortness of breath when resting, hacking cough, increased swelling of hands, feet, stomach.  Rested well last night with one pillow in use.   Do you understand why you were in the hospital? Yes.   Do you understand the discharge instructions? Yes, increase activity slowly.     Where were you discharged to? Home.    Items Reviewed:  Medications reviewed: Yes. Managed by family.  Taking all scheduled medications as directed.  Allergies reviewed: Yes.  Dietary changes reviewed: Yes, heart healthy diet.  Referrals reviewed: Yes.  Follow up with General Surgery, Gasrtoenterology, Cardiology, [place], Radiology and pcp.   Functional Questionnaire:   Activities of Daily Living (ADLs):   She states they are independent in the following: Dressing, toileting, self feed States they require assistance with the following: Bathing, meal prep, ambulating. Walker in use with ambulation.   Any transportation issues/concerns?: None.   Any patient concerns? Family requests virtual visit. BMP needed in 1 week.  Family to ask if home health RN can draw lab at home.  If not, they will call back and convert the virtual visit to office visit.    Confirmed importance and date/time of follow-up visits scheduled Yes, virtual visit scheduled 08/31/18 at 11:30.  Provider Appointment booked with Dr. Caryl Bis, pcp.   Confirmed with patient if condition begins to worsen call PCP or go to the ER.  Patient was given the office number and encouraged to call back with question or concerns.  : Yes

## 2018-08-27 NOTE — Telephone Encounter (Addendum)
Family member (grandson, Ysidro Evert) confirmed RN with home health will draw lab needed, per discharge, in home tomorrow 3/27.   Please fax order to [facility] for BMP to [phone].  Order request sent to NP for follow up.  Virtual visit with pcp to remain on 3/30.

## 2018-08-28 DIAGNOSIS — H9190 Unspecified hearing loss, unspecified ear: Secondary | ICD-10-CM | POA: Diagnosis not present

## 2018-08-28 DIAGNOSIS — Z48815 Encounter for surgical aftercare following surgery on the digestive system: Secondary | ICD-10-CM | POA: Diagnosis not present

## 2018-08-28 DIAGNOSIS — K219 Gastro-esophageal reflux disease without esophagitis: Secondary | ICD-10-CM | POA: Diagnosis not present

## 2018-08-28 DIAGNOSIS — K810 Acute cholecystitis: Secondary | ICD-10-CM | POA: Diagnosis not present

## 2018-08-28 DIAGNOSIS — I5032 Chronic diastolic (congestive) heart failure: Secondary | ICD-10-CM | POA: Diagnosis not present

## 2018-08-28 DIAGNOSIS — M8580 Other specified disorders of bone density and structure, unspecified site: Secondary | ICD-10-CM | POA: Diagnosis not present

## 2018-08-28 DIAGNOSIS — N6019 Diffuse cystic mastopathy of unspecified breast: Secondary | ICD-10-CM | POA: Diagnosis not present

## 2018-08-28 DIAGNOSIS — K5790 Diverticulosis of intestine, part unspecified, without perforation or abscess without bleeding: Secondary | ICD-10-CM | POA: Diagnosis not present

## 2018-08-28 DIAGNOSIS — E785 Hyperlipidemia, unspecified: Secondary | ICD-10-CM | POA: Diagnosis not present

## 2018-08-28 DIAGNOSIS — Z9181 History of falling: Secondary | ICD-10-CM | POA: Diagnosis not present

## 2018-08-28 DIAGNOSIS — Z7901 Long term (current) use of anticoagulants: Secondary | ICD-10-CM | POA: Diagnosis not present

## 2018-08-28 DIAGNOSIS — I4891 Unspecified atrial fibrillation: Secondary | ICD-10-CM | POA: Diagnosis not present

## 2018-08-28 DIAGNOSIS — I110 Hypertensive heart disease with heart failure: Secondary | ICD-10-CM | POA: Diagnosis not present

## 2018-08-28 DIAGNOSIS — Z434 Encounter for attention to other artificial openings of digestive tract: Secondary | ICD-10-CM | POA: Diagnosis not present

## 2018-08-28 DIAGNOSIS — M1990 Unspecified osteoarthritis, unspecified site: Secondary | ICD-10-CM | POA: Diagnosis not present

## 2018-08-28 DIAGNOSIS — M810 Age-related osteoporosis without current pathological fracture: Secondary | ICD-10-CM | POA: Diagnosis not present

## 2018-08-29 DIAGNOSIS — I110 Hypertensive heart disease with heart failure: Secondary | ICD-10-CM | POA: Diagnosis not present

## 2018-08-29 DIAGNOSIS — I4891 Unspecified atrial fibrillation: Secondary | ICD-10-CM | POA: Diagnosis not present

## 2018-08-29 DIAGNOSIS — K810 Acute cholecystitis: Secondary | ICD-10-CM | POA: Diagnosis not present

## 2018-08-29 DIAGNOSIS — I5032 Chronic diastolic (congestive) heart failure: Secondary | ICD-10-CM | POA: Diagnosis not present

## 2018-08-29 DIAGNOSIS — Z434 Encounter for attention to other artificial openings of digestive tract: Secondary | ICD-10-CM | POA: Diagnosis not present

## 2018-08-29 DIAGNOSIS — Z48815 Encounter for surgical aftercare following surgery on the digestive system: Secondary | ICD-10-CM | POA: Diagnosis not present

## 2018-08-29 LAB — BASIC METABOLIC PANEL
BUN: 17 (ref 4–21)
Creatinine: 1.3 — AB (ref 0.5–1.1)
Sodium: 137 (ref 137–147)

## 2018-08-31 ENCOUNTER — Encounter: Payer: Self-pay | Admitting: Family Medicine

## 2018-08-31 ENCOUNTER — Telehealth: Payer: Self-pay

## 2018-08-31 ENCOUNTER — Other Ambulatory Visit: Payer: Self-pay

## 2018-08-31 ENCOUNTER — Telehealth: Payer: Self-pay | Admitting: Cardiovascular Disease

## 2018-08-31 ENCOUNTER — Telehealth: Payer: Self-pay | Admitting: Family Medicine

## 2018-08-31 ENCOUNTER — Ambulatory Visit (INDEPENDENT_AMBULATORY_CARE_PROVIDER_SITE_OTHER): Payer: Medicare Other | Admitting: Family Medicine

## 2018-08-31 DIAGNOSIS — I5032 Chronic diastolic (congestive) heart failure: Secondary | ICD-10-CM | POA: Diagnosis not present

## 2018-08-31 DIAGNOSIS — K219 Gastro-esophageal reflux disease without esophagitis: Secondary | ICD-10-CM

## 2018-08-31 DIAGNOSIS — K8042 Calculus of bile duct with acute cholecystitis without obstruction: Secondary | ICD-10-CM | POA: Diagnosis not present

## 2018-08-31 MED ORDER — OMEPRAZOLE 40 MG PO CPDR: 40.0000 mg | DELAYED_RELEASE_CAPSULE | Freq: Every day | ORAL | 1 refills | Status: DC

## 2018-08-31 NOTE — Assessment & Plan Note (Addendum)
Patient seems to be improving with regards to this.  No purulence to the fluid in her cholecystostomy bag.  I discussed that there would be some level of deconditioning given how long she was in the hospital and to expect that it would take 3 to 4 days out of the hospital per day in the hospital for her to improved to her baseline.  They verbalized understanding.  I discussed that GI evaluation should likely be in person and they could determine whether or not interventional radiology would be in person closer to the time of that visit.  They will follow-up with general surgery as scheduled as well.  We will check on home health physical therapy and also on how to order her supplies.

## 2018-08-31 NOTE — Telephone Encounter (Signed)
Spoke with the pt daughter. The pt had a virtaul visit with her pcp and recommended an earlier f/u with Dr.Gollan. The pt was given lots of fluid during her recent hospitalization and had weight gain close to 20lbs. She is down 7-8lbs since d/c, buts still has visible LE edema. They would like to avoid bringing the pt out if possible Pt had a virtual evist scheduled with Dr.Gollan on 09/15/18, but they woul like an earlier appt. Virtual appt moved up to 09/02/18 at 10:20am. Pt daughter aware of appt date and time and voiced appreciation for the assistance.

## 2018-08-31 NOTE — Progress Notes (Signed)
This visit type was conducted due to national recommendations for restrictions regarding the COVID-19 pandemic (e.g. social distancing).  This format is felt to be most appropriate for this patient at this time.  All issues noted in this document were discussed and addressed.  No physical exam was performed (except for noted visual exam findings with Video Visits).    Virtual Visit via Video Note  I connected with Kylie May on 08/31/18 at 11:30 AM EDT by a video enabled telemedicine application and verified that I am speaking with the correct person using two identifiers. Location patient: home Location provider: work Persons participating in the virtual visit: patient, provider, Ysidro Evert (grandson), Hassan Rowan (daughter)  I discussed the limitations of evaluation and management by telemedicine and the availability of in person appointments. The patient expressed understanding and agreed to proceed.   HPI: Patient was hospitalized for severe epigastric pain from 08/15/2018-08/26/2018.  She underwent work-up which included CT of her abdomen and pelvis which showed intra-and extrahepatic ductal dilatation and gallbladder wall thickening but no stones.  She was transferred to [facility] for ERCP.  She underwent cholecystostomy tube placement by interventional radiology and subsequently underwent ERCP which showed dilatation of the bile duct and cholelithiasis.  She had the stone removed and had a biliary sphincterectomy performed and a plastic stent was placed in the pancreatic duct.  She tolerated this well though did develop A. fib with RVR complicated by decompensated heart failure with fluid retention.  She was treated with Zosyn and transitioned to Augmentin.  She was continued on anticoagulation with apixaban and also continued on amiodarone and metoprolol.  She was placed on oral Lasix as well for fluid retention.  The patient notes since discharge she has had no abdominal pain or chest  pain.  She has had some dyspnea and has felt weak and tired overall.  Her family was concerned that she is not progressing enough after being out of the hospital for 5 days.  They noted they understood that this may just be deconditioning.  She does have some dyspnea with ambulation though no chest pain.  She has chronic cough that has not changed.  No fever or wheezing.  They do note that she does have trouble swallowing things at times and wonder if that is related to her hiatal hernia.  They have been monitoring her weight which is down 6 to 7 pounds though only down about half a pound over the last 2 days.  She does have some pitting edema to the midshin.  She notes no orthopnea.  She had lab work drawn last week through home health.  We have not received those results yet.  They have been checking her blood pressure and it is typically running 94-90 9/55-58.  Heart rate of 48 earlier today.  She has had some nausea though no vomiting or diarrhea.  She was supposed to have home health physical therapy though that has not occurred yet.  They have been draining the cholecystostomy bag.  They need refills of syringes and antiseptic wipes as well as gloves to be able to do this.  Discharge summary reviewed.  Medications reviewed.  They are going to schedule GI follow-up, interventional radiology follow-up, and cardiology follow-up.  They wanted to know if any of these could be virtual visits.  They also question whether or not her dermatology visit could be pushed.  They note no history of melanoma.   ROS: See pertinent positives and negatives per HPI.  Past Medical History:  Diagnosis Date  . A-fib ([place])   . Acid reflux disease   . Arrhythmia   . Atrial fibrillation with RVR ([place]) 08/15/2018  . Chronic diastolic CHF (congestive heart failure) ([place])   . Compression fracture of lumbar vertebra ([place])   . Diverticulitis 01-2008   [facility]  . Diverticulosis   . Esophageal stricture 2006  . Femoral  bruit    Right  . Fibrocystic breast disease   . Hiatal hernia   . Hypercholesterolemia    [place] study LDL goal = < 160  . Mitral regurgitation    a. 06/2014 EF 55-60%, elevated end-diastolic pressures, dilated LA at 4.3 cm, mildly dilated RA, severe mitral regurgitation, mild aortic sclerosis without stenosis, mod-severe TR  . Mitral valve prolapse   . Osteoporosis    Dr Matthew Saras  . Pancreatitis 07-2008   Hospitalized   . Schatzki's ring   . Sepsis ([employer]) 08/15/2018  . Skin cancer    facial x 2. [name]    Past Surgical History:  Procedure Laterality Date  . BILIARY STENT PLACEMENT  08/21/2018   Procedure: BILIARY STENT PLACEMENT;  Surgeon: [name], MD;  Location: [facility] ENDOSCOPY;  Service: Gastroenterology;;  . COLONOSCOPY  2006  . Epidural steroids     x1  . ERCP N/A 08/21/2018   Procedure: ENDOSCOPIC RETROGRADE CHOLANGIOPANCREATOGRAPHY (ERCP);  Surgeon: [name], MD;  Location: [facility];  Service: Gastroenterology;  Laterality: N/A;  . ESOPHAGEAL DILATION  2006  . G3 P4 (twins)    . IR CHOLANGIOGRAM EXISTING TUBE  08/18/2018  . IR PERC CHOLECYSTOSTOMY  08/16/2018  . REMOVAL OF STONES  08/21/2018   Procedure: REMOVAL OF STONES;  Surgeon: [name], MD;  Location: [facility];  Service: Gastroenterology;;  . SKIN CANCER EXCISION    . SPHINCTEROTOMY  08/21/2018   Procedure: SPHINCTEROTOMY;  Surgeon: [name], MD;  Location: [facility] ENDOSCOPY;  Service: Gastroenterology;;    Family History  Problem Relation Age of Onset  . Breast cancer Maternal Aunt   . Hepatitis Daughter        C  . Lung disease Mother        lung tumor  . Heart attack Daughter 28       S/P stents  . Heart attack Sister 17  . Diabetes Neg Hx   . Stroke Neg Hx     SOCIAL HX: Former smoker.   Current Outpatient Medications:  .  amiodarone (PACERONE) 200 MG tablet, Take 1 tablet (200 mg total) by mouth 2 (two) times daily., Disp: 60 tablet, Rfl: 2 .  apixaban (ELIQUIS) 2.5 MG TABS tablet,  Take 2.5 mg by mouth 2 (two) times daily., Disp: , Rfl:  .  furosemide (LASIX) 40 MG tablet, Take 1 tablet (40 mg total) by mouth daily., Disp: 30 tablet, Rfl: 2 .  magnesium chloride (SLOW-MAG) 64 MG TBEC SR tablet, Take 2 tablets (128 mg total) by mouth daily., Disp: 60 tablet, Rfl: 3 .  metoprolol tartrate (LOPRESSOR) 50 MG tablet, Take 1 tablet (50 mg total) by mouth 2 (two) times daily., Disp: 60 tablet, Rfl: 2 .  potassium chloride (KLOR-CON 10) 10 MEQ tablet, Take 1 tablet (10 mEq total) by mouth daily. With lasix., Disp: 30 tablet, Rfl: 2 .  rosuvastatin (CRESTOR) 20 MG tablet, Take 1 tablet (20 mg total) by mouth daily., Disp: 90 tablet, Rfl: 1 .  triamcinolone ointment (KENALOG) 0.1 %, Apply 1 application topically 2 (two) times daily as needed (rash). ,  Disp: , Rfl:  .  omeprazole (PRILOSEC) 40 MG capsule, Take 1 capsule (40 mg total) by mouth daily., Disp: 90 capsule, Rfl: 1  EXAM:  VITALS per patient if applicable: Heart rate 50, BP 107/65  GENERAL: alert, oriented, appears well and in no acute distress  HEENT: atraumatic, conjunttiva clear, no obvious abnormalities on inspection of external nose and ears  NECK: normal movements of the head and neck  LUNGS: on inspection no signs of respiratory distress, breathing rate appears normal, no obvious gross SOB, gasping or wheezing  CV: no obvious cyanosis, there is apparent pitting edema when the patient's grandson presses into her mid shin  MS: moves all visible extremities without noticeable abnormality  PSYCH/NEURO: pleasant and cooperative, no obvious depression or anxiety, speech and thought processing grossly intact  Cholecystostomy bag with greenish-yellow fluid, no blood  ASSESSMENT AND PLAN:  Discussed the following assessment and plan:  Choledocholithiasis with acute cholecystitis - Plan: Ambulatory referral to [facility]  Chronic diastolic CHF (congestive heart failure) (HCC)  Gastroesophageal reflux disease  without esophagitis  Choledocholithiasis with acute cholecystitis Patient seems to be improving with regards to this.  No purulence to the fluid in her cholecystostomy bag.  I discussed that there would be some level of deconditioning given how long she was in the hospital and to expect that it would take 3 to 4 days out of the hospital per day in the hospital for her to improved to her baseline.  They verbalized understanding.  I discussed that GI evaluation should likely be in person and they could determine whether or not interventional radiology would be in person closer to the time of that visit.  They will follow-up with general surgery as scheduled as well.  We will check on home health physical therapy and also on how to order her supplies.  Chronic diastolic CHF (congestive heart failure) (HCC) Echo in the hospital was unable to evaluate diastolic function.  Based on elevated BNP and volume overload with swelling it would seem as though she does have some level of heart failure.  Her blood pressure limits what we can do with her Lasix at this time.  I will send a message to the patient's cardiologist to determine the next step in management.  Her metoprolol may need to be decreased as well to help with her fatigue and tiredness though the patient's family was somewhat hesitant to do that given that her heart rate became uncontrolled on the lower dose of metoprolol.  GERD (gastroesophageal reflux disease) They do report some mild cough that may be related to her reflux.  She was off of omeprazole while in the hospital.  She does have some issues getting food to go down.  Discussed having her follow-up with GI to consider EGD.     I discussed the assessment and treatment plan with the patient. The patient was provided an opportunity to ask questions and all were answered. The patient agreed with the plan and demonstrated an understanding of the instructions.   The patient was advised to call  back or seek an in-person evaluation if the symptoms worsen or if the condition fails to improve as anticipated.  I provided 50 minutes of non-face-to-face time during this encounter.   Tommi Rumps, MD

## 2018-08-31 NOTE — Telephone Encounter (Signed)
Error

## 2018-08-31 NOTE — Telephone Encounter (Deleted)
Attempted to contact the patient

## 2018-08-31 NOTE — Telephone Encounter (Signed)
° ° °  Spoke with patient's grandson Hulen Skains, he is requesting a virtual appt for his grandmother, sooner than the 4/14 appt given  Hulen Skains [phone]

## 2018-08-31 NOTE — Assessment & Plan Note (Signed)
They do report some mild cough that may be related to her reflux.  She was off of omeprazole while in the hospital.  She does have some issues getting food to go down.  Discussed having her follow-up with GI to consider EGD.

## 2018-08-31 NOTE — Assessment & Plan Note (Signed)
Echo in the hospital was unable to evaluate diastolic function.  Based on elevated BNP and volume overload with swelling it would seem as though she does have some level of heart failure.  Her blood pressure limits what we can do with her Lasix at this time.  I will send a message to the patient's cardiologist to determine the next step in management.  Her metoprolol may need to be decreased as well to help with her fatigue and tiredness though the patient's family was somewhat hesitant to do that given that her heart rate became uncontrolled on the lower dose of metoprolol.

## 2018-08-31 NOTE — Telephone Encounter (Signed)
Spoke with patients caregiver and he reviewed that video call would be acceptable and read consent to him and he verbalized agreement with that. Reviewed that provider would send link to his number on day of appointment. He verbalized understanding with no further questions.  YOUR CARDIOLOGY TEAM HAS ARRANGED FOR AN E-VISIT FOR YOUR APPOINTMENT - PLEASE REVIEW IMPORTANT INFORMATION BELOW SEVERAL DAYS PRIOR TO YOUR APPOINTMENT  Due to the recent COVID-19 pandemic, we are transitioning in-person office visits to tele-medicine visits in an effort to decrease unnecessary exposure to our patients and staff. Medicare and most insurances are covering these visits without a copay needed. You will need a smartphone if possible. We also encourage you to sign up for MyChart. For patients that do not have this, we can still complete the visit using a regular telephone but do prefer a smartphone to enable video when possible. You may have a close family member that can help. If possible, we also ask that you have a blood pressure cuff and scale at home to measure your blood pressure, heart rate and weight prior to your scheduled appointment. Patients with clinical needs that need an in-person evaluation and testing will still be able to come to the office if absolutely necessary. If you have any questions, feel free to call our office.     2-3 DAYS BEFORE YOUR APPOINTMENT  You will receive a telephone call from one of our [place] team members - your caller ID may say "Unknown caller." If this is a video visit, we will confirm that you have been able to download the WebEx app. We will remind you check your blood pressure, heart rate and weight prior to your scheduled appointment. If you have an Apple Watch or Kardia, please upload any pertinent ECG strips the day before or morning of your appointment to [place]. Our staff will also make sure you have reviewed the consent and agree to move forward with your scheduled  tele-health visit.     THE DAY OF YOUR APPOINTMENT  Approximately 15 minutes prior to your scheduled appointment, you will receive a telephone call from one of [place] team - your caller ID may say "Unknown caller."  Our staff will confirm medications, vital signs for the day and any symptoms you may be experiencing. Please have this information available prior to the time of visit start. It may also be helpful for you to have a pad of paper and pen handy for any instructions given during your visit. They will also walk you through joining the WebEx smartphone meeting if this is a video visit.    CONSENT FOR TELE-HEALTH VISIT - PLEASE RVIEW  I hereby voluntarily request, consent and authorize [facility] and its employed or contracted physicians, physician assistants, nurse practitioners or other licensed health care professionals (the Practitioner), to provide me with telemedicine health care services (the "Services") as deemed necessary by the treating Practitioner. I acknowledge and consent to receive the Services by the Practitioner via telemedicine. I understand that the telemedicine visit will involve communicating with the Practitioner through live audiovisual communication technology and the disclosure of certain medical information by electronic transmission. I acknowledge that I have been given the opportunity to request an in-person assessment or other available alternative prior to the telemedicine visit and am voluntarily participating in the telemedicine visit.  I understand that I have the right to withhold or withdraw my consent to the use of telemedicine in the course of my care at any time, without  affecting my right to future care or treatment, and that the Practitioner or I may terminate the telemedicine visit at any time. I understand that I have the right to inspect all information obtained and/or recorded in the course of the telemedicine visit and may receive copies of  available information for a reasonable fee.  I understand that some of the potential risks of receiving the Services via telemedicine include:  [name] Delay or interruption in medical evaluation due to technological equipment failure or disruption; . Information transmitted may not be sufficient (e.g. poor resolution of images) to allow for appropriate medical decision making by the Practitioner; and/or  . In rare instances, security protocols could fail, causing a breach of personal health information.  Furthermore, I acknowledge that it is my responsibility to provide information about my medical history, conditions and care that is complete and accurate to the best of my ability. I acknowledge that Practitioner's advice, recommendations, and/or decision may be based on factors not within their control, such as incomplete or inaccurate data provided by me or distortions of diagnostic images or specimens that may result from electronic transmissions. I understand that the practice of medicine is not an exact science and that Practitioner makes no warranties or guarantees regarding treatment outcomes. I acknowledge that I will receive a copy of this consent concurrently upon execution via email to the email address I last provided but may also request a printed copy by calling the office of [place].    I understand that my insurance will be billed for this visit.   I have read or had this consent read to me. . I understand the contents of this consent, which adequately explains the benefits and risks of the Services being provided via telemedicine.  . I have been provided ample opportunity to ask questions regarding this consent and the Services and have had my questions answered to my satisfaction. . I give my informed consent for the services to be provided through the use of telemedicine in my medical care  By participating in this telemedicine visit I agree to the above.

## 2018-08-31 NOTE — Telephone Encounter (Signed)
Please contact 1 of the patient's family members who is on her DPR to get her set up for 32-month follow-up. Please additionally contact [facility] to see if they can give [place] her lab results from Friday.  I will also forward to Melissa to send her [facility].

## 2018-08-31 NOTE — Telephone Encounter (Signed)
Tried to call patient to change her current appointment to an e-visit however her voice mailbox is full and unable to record messages at this time.

## 2018-09-01 ENCOUNTER — Telehealth: Payer: Self-pay | Admitting: Family Medicine

## 2018-09-01 NOTE — Telephone Encounter (Signed)
Called and spoke with pt's daughter Santiago Glad. Santiago Glad stated that she will have to discuss with her other sibling that are with there mother now. Santiago Glad stated that her mother just returned from the hospital and she had been hospitalized  for two weeks. Santiago Glad stated that they have multiple appt already set up with other doctors and they will need to discuss what will work best. However, they are concern to take her to appt due to the corona virus and would like to avoid any unneeded appts if possible to allow their mother time to heal as well and rest.   I called [facility] and was put on hold did fax [facility] requesting for labs on Friday at [phone].   Will try and call or refax again to make sure we can have acces to pt's lab work.Durene Cal to PCP as an [place]

## 2018-09-01 NOTE — Telephone Encounter (Signed)
Called and spoke to [place] at [place]. Gave verbal orders for PT as requested.

## 2018-09-01 NOTE — Telephone Encounter (Signed)
Copied from [place] [id number]. Topic: Quick Communication - Home Health Verbal Orders >> Sep 01, 2018  9:27 AM Rayann Heman wrote: Caller/Agency:christina /[facility]  Callback Number: [phone] Requesting PT Frequency:1x1 2x3

## 2018-09-02 ENCOUNTER — Telehealth (INDEPENDENT_AMBULATORY_CARE_PROVIDER_SITE_OTHER): Payer: Medicare Other | Admitting: Cardiovascular Disease

## 2018-09-02 ENCOUNTER — Telehealth: Payer: Self-pay | Admitting: Family Medicine

## 2018-09-02 ENCOUNTER — Other Ambulatory Visit: Payer: Self-pay

## 2018-09-02 ENCOUNTER — Other Ambulatory Visit: Payer: Self-pay | Admitting: Surgery

## 2018-09-02 DIAGNOSIS — I110 Hypertensive heart disease with heart failure: Secondary | ICD-10-CM | POA: Diagnosis not present

## 2018-09-02 DIAGNOSIS — E43 Unspecified severe protein-calorie malnutrition: Secondary | ICD-10-CM | POA: Diagnosis not present

## 2018-09-02 DIAGNOSIS — I5032 Chronic diastolic (congestive) heart failure: Secondary | ICD-10-CM | POA: Diagnosis not present

## 2018-09-02 DIAGNOSIS — E7800 Pure hypercholesterolemia, unspecified: Secondary | ICD-10-CM | POA: Insufficient documentation

## 2018-09-02 DIAGNOSIS — I480 Paroxysmal atrial fibrillation: Secondary | ICD-10-CM | POA: Diagnosis not present

## 2018-09-02 DIAGNOSIS — K8042 Calculus of bile duct with acute cholecystitis without obstruction: Secondary | ICD-10-CM | POA: Diagnosis not present

## 2018-09-02 DIAGNOSIS — D649 Anemia, unspecified: Secondary | ICD-10-CM | POA: Diagnosis not present

## 2018-09-02 DIAGNOSIS — I700 Atherosclerosis of aorta: Secondary | ICD-10-CM | POA: Diagnosis not present

## 2018-09-02 DIAGNOSIS — Z434 Encounter for attention to other artificial openings of digestive tract: Secondary | ICD-10-CM | POA: Diagnosis not present

## 2018-09-02 DIAGNOSIS — I4891 Unspecified atrial fibrillation: Secondary | ICD-10-CM | POA: Diagnosis not present

## 2018-09-02 DIAGNOSIS — R600 Localized edema: Secondary | ICD-10-CM | POA: Insufficient documentation

## 2018-09-02 DIAGNOSIS — E46 Unspecified protein-calorie malnutrition: Secondary | ICD-10-CM | POA: Insufficient documentation

## 2018-09-02 DIAGNOSIS — Z48815 Encounter for surgical aftercare following surgery on the digestive system: Secondary | ICD-10-CM | POA: Diagnosis not present

## 2018-09-02 DIAGNOSIS — K810 Acute cholecystitis: Secondary | ICD-10-CM | POA: Diagnosis not present

## 2018-09-02 DIAGNOSIS — K8043 Calculus of bile duct with acute cholecystitis with obstruction: Secondary | ICD-10-CM

## 2018-09-02 MED ORDER — OMEPRAZOLE 40 MG PO CPDR: 40.0000 mg | DELAYED_RELEASE_CAPSULE | Freq: Every day | ORAL | 0 refills | Status: DC

## 2018-09-02 MED ORDER — METOPROLOL TARTRATE 50 MG PO TABS: 25.0000 mg | ORAL_TABLET | Freq: Two times a day (BID) | ORAL | Status: DC

## 2018-09-02 MED ORDER — APIXABAN 2.5 MG PO TABS: 2.5000 mg | ORAL_TABLET | Freq: Two times a day (BID) | ORAL | 3 refills | Status: DC

## 2018-09-02 MED ORDER — ROSUVASTATIN CALCIUM 20 MG PO TABS: 20.0000 mg | ORAL_TABLET | Freq: Every day | ORAL | 3 refills | Status: DC

## 2018-09-02 MED FILL — NORMAL SALINE FLUSH SYRINGE: 0.9 | 45 days supply | Qty: 450 | Fill #0

## 2018-09-02 NOTE — Telephone Encounter (Signed)
Copied from [place] [phone]. Topic: Quick Communication - Home Health Verbal Orders >> Sep 02, 2018  1:28 PM Berneta Levins wrote: Caller/Agency: Colletta [place] with [facility] Number: [phone] Requesting OT/PT/Skilled Nursing/Social Work/Speech Therapy: OT Frequency: 1x a week for 2 weeks, 2x a week for 2 weeks

## 2018-09-02 NOTE — Telephone Encounter (Signed)
Called and spoke with [facility] @ [facility].  Gave verbal orders for OT as requested.

## 2018-09-02 NOTE — Progress Notes (Addendum)
Virtual Visit via Video Note   This visit type was conducted due to national recommendations for restrictions regarding the COVID-19 Pandemic (e.g. social distancing) in an effort to limit this patient's exposure and mitigate transmission in our community.  Due to her co-morbid illnesses, this patient is at least at moderate risk for complications without adequate follow up.  This format is felt to be most appropriate for this patient at this time.  All issues noted in this document were discussed and addressed.  A limited physical exam was performed with this format.  Please refer to the patient's chart for her consent to telehealth for [facility].    Date:  09/02/2018   ID:  Kylie May, DOB 1921-07-27, MRN [id number]  Patient Location:  [place] [address]   Provider location:   [facility], [place] office  PCP:  [name], MD  Cardiologist:  Ida Rogue, MD   Chief Complaint: Leg swelling  WebCam used  History of Present Illness:    Kylie May is a 85 y.o. female who presents via audio/video conferencing for a telehealth visit today.   The patient does not symptoms concerning for COVID-19 infection (fever, chills, cough, or new SHORTNESS OF BREATH).  Please refer to prior office visit for complete details: Patient has a past medical history of  paroxysmal asymptomatic a-fib,  HTN,  hiatal hernia,  osteopenia,  degenerative joint disease, and  Scoliosis Long history of falls, 2018 prior fall with pelvic fracture  admitted to [facility] 1/7-1/10/16 for new onset a-fib  And mild diastolic CHF, who presents today for follow-up of her atrial fibrillation, aortic atherosclerosis  She is very hard of hearing, presents with her daughter On her last clinic visit she was having weight loss, had poor diet, lots of pasta and ice cream Was on Crestor 3 days a week Total cholesterol 170  No symptoms of atrial fibrillation  Amiodarone previously held for elevated TSH Denies any falls Prior office visit had a fall with pelvic fracture  Patient does run medications, puts them in a pillbox  severe epigastric pain from 08/15/2018-08/26/2018  CT of her abdomen and pelvis which showed intra-and extrahepatic ductal dilatation and gallbladder wall thickening but no stones   transferred to [facility] for ERCP.  She underwent cholecystostomy tube placement by interventional radiology and subsequently underwent ERCP which showed dilatation of the bile duct and cholelithiasis.  She had the stone removed and had a biliary sphincterectomy performed and a plastic stent was placed in the pancreatic duct   develop A. fib with RVR complicated by decompensated heart failure with fluid retention.  continued on anticoagulation with apixaban amiodarone metoprolol placed on oral Lasix  weight which is down 6 to 7 pounds  pitting edema to the midshin.  Low blood pressure, low heart rate  draining the cholecystostomy bag. Family does not know where to get ostomy supplies  Seen by primary care August 31, 2018 Albumin 2.2, HCT low, drop in the past month  Long discussion with family concerning her lower extremity edema  Patient is very hard of hearing, history obtained by family sitting at her side  Prior CV studies:   The following studies were reviewed today:  CT ABD images pulled up in the office and discussed with her in detail Scoliosis noted Moderate diffuse abdominal aortic atherosclerosis. No evidence of aneurysm  Echocardiogram 06/09/2014 shows normal LV function, severely dilated left atrium, severe MR, moderate to  severe TR  Past Medical History:  Diagnosis Date  . A-fib ([place])   . Acid reflux disease   . Arrhythmia   . Atrial fibrillation with RVR ([place]) 08/15/2018  . Chronic diastolic CHF (congestive heart failure) ([place])   . Compression fracture of lumbar vertebra ([place])   . Diverticulitis 01-2008    [facility]  . Diverticulosis   . Esophageal stricture 2006  . Femoral bruit    Right  . Fibrocystic breast disease   . Hiatal hernia   . Hypercholesterolemia    [place] study LDL goal = < 160  . Mitral regurgitation    a. 06/2014 EF 55-60%, elevated end-diastolic pressures, dilated LA at 4.3 cm, mildly dilated RA, severe mitral regurgitation, mild aortic sclerosis without stenosis, mod-severe TR  . Mitral valve prolapse   . Osteoporosis    Dr Matthew Saras  . Pancreatitis 07-2008   Hospitalized   . Schatzki's ring   . Sepsis ([place]) 08/15/2018  . Skin cancer    facial x 2. [name]   Past Surgical History:  Procedure Laterality Date  . BILIARY STENT PLACEMENT  08/21/2018   Procedure: BILIARY STENT PLACEMENT;  Surgeon: [name], MD;  Location: [facility] ENDOSCOPY;  Service: Gastroenterology;;  . COLONOSCOPY  2006  . Epidural steroids     x1  . ERCP N/A 08/21/2018   Procedure: ENDOSCOPIC RETROGRADE CHOLANGIOPANCREATOGRAPHY (ERCP);  Surgeon: [name], MD;  Location: [facility];  Service: Gastroenterology;  Laterality: N/A;  . ESOPHAGEAL DILATION  2006  . G3 P4 (twins)    . IR CHOLANGIOGRAM EXISTING TUBE  08/18/2018  . IR PERC CHOLECYSTOSTOMY  08/16/2018  . REMOVAL OF STONES  08/21/2018   Procedure: REMOVAL OF STONES;  Surgeon: [name], MD;  Location: [facility];  Service: Gastroenterology;;  . SKIN CANCER EXCISION    . SPHINCTEROTOMY  08/21/2018   Procedure: SPHINCTEROTOMY;  Surgeon: [name], MD;  Location: [facility] ENDOSCOPY;  Service: Gastroenterology;;     Current Meds  Medication Sig  . amiodarone (PACERONE) 200 MG tablet Take 1 tablet (200 mg total) by mouth 2 (two) times daily.  [name] apixaban (ELIQUIS) 2.5 MG TABS tablet Take 2.5 mg by mouth 2 (two) times daily.  . furosemide (LASIX) 40 MG tablet Take 1 tablet (40 mg total) by mouth daily.  . magnesium chloride (SLOW-MAG) 64 MG TBEC SR tablet Take 2 tablets (128 mg total) by mouth daily.  . metoprolol tartrate (LOPRESSOR) 50  MG tablet Take 1 tablet (50 mg total) by mouth 2 (two) times daily.  [name] omeprazole (PRILOSEC) 40 MG capsule Take 1 capsule (40 mg total) by mouth daily for 30 days.  . potassium chloride (KLOR-CON 10) 10 MEQ tablet Take 1 tablet (10 mEq total) by mouth daily. With lasix.  [name] rosuvastatin (CRESTOR) 20 MG tablet Take 1 tablet (20 mg total) by mouth daily.  [name] triamcinolone ointment (KENALOG) 0.1 % Apply 1 application topically 2 (two) times daily as needed (rash).      Allergies:   Metronidazole and Penicillin g   Social History   Tobacco Use  . Smoking status: Former Smoker    Last attempt to quit: 06/04/1951    Years since quitting: 67.2  . Smokeless tobacco: Never Used  . Tobacco comment: Smoked 1945-1953 , up to 2 cigarettes/day  Substance Use Topics  . Alcohol use: Yes    Alcohol/week: 7.0 standard drinks    Types: 7 Glasses of wine per week  . Drug use: No  Current Outpatient Medications on File Prior to Visit  Medication Sig Dispense Refill  . amiodarone (PACERONE) 200 MG tablet Take 1 tablet (200 mg total) by mouth 2 (two) times daily. 60 tablet 2  . apixaban (ELIQUIS) 2.5 MG TABS tablet Take 2.5 mg by mouth 2 (two) times daily.    . furosemide (LASIX) 40 MG tablet Take 1 tablet (40 mg total) by mouth daily. 30 tablet 2  . magnesium chloride (SLOW-MAG) 64 MG TBEC SR tablet Take 2 tablets (128 mg total) by mouth daily. 60 tablet 3  . metoprolol tartrate (LOPRESSOR) 50 MG tablet Take 1 tablet (50 mg total) by mouth 2 (two) times daily. 60 tablet 2  . omeprazole (PRILOSEC) 40 MG capsule Take 1 capsule (40 mg total) by mouth daily for 30 days. 30 capsule 1  . potassium chloride (KLOR-CON 10) 10 MEQ tablet Take 1 tablet (10 mEq total) by mouth daily. With lasix. 30 tablet 2  . rosuvastatin (CRESTOR) 20 MG tablet Take 1 tablet (20 mg total) by mouth daily. 90 tablet 1  . triamcinolone ointment (KENALOG) 0.1 % Apply 1 application topically 2 (two) times daily as needed (rash).       No current facility-administered medications on file prior to visit.      Family Hx: The patient's family history includes Breast cancer in her maternal aunt; Heart attack (age of onset: 42) in her daughter; Heart attack (age of onset: 21) in her sister; Hepatitis in her daughter; Lung disease in her mother. There is no history of Diabetes or Stroke.  ROS:   Please see the history of present illness.    Review of Systems  Constitutional: Positive for malaise/fatigue.  Respiratory: Positive for shortness of breath.   Cardiovascular: Positive for leg swelling.  Gastrointestinal: Negative.   Musculoskeletal: Negative.   Neurological: Negative.   Psychiatric/Behavioral: Negative.   All other systems reviewed and are negative.     Labs/Other Tests and Data Reviewed:    Recent Labs: 08/14/2018: TSH 9.124 08/15/2018: B Natriuretic Peptide 1,005.7 08/19/2018: ALT 46 08/24/2018: Hemoglobin 10.7; Platelets 303 08/25/2018: Magnesium 1.7 08/26/2018: BUN 15; Creatinine, Ser 1.19; Potassium 3.8; Sodium 134   Recent Lipid Panel Lab Results  Component Value Date/Time   CHOL 173 01/14/2017 03:12 PM   TRIG 121.0 01/14/2017 03:12 PM   HDL 67.30 01/14/2017 03:12 PM   CHOLHDL 3 01/14/2017 03:12 PM   LDLCALC 81 01/14/2017 03:12 PM   LDLDIRECT 121.4 06/17/2011 11:46 AM    Wt Readings from Last 3 Encounters:  08/26/18 120 lb 14.4 oz (54.8 kg)  08/15/18 105 lb 14.4 oz (48 kg)  07/13/18 102 lb 6.4 oz (46.4 kg)     Exam:    Vital Signs:  There were no vitals taken for this visit.   Well nourished, well developed female in no acute distress.   ASSESSMENT & PLAN:    Paroxysmal atrial fibrillation (HCC) We will continue amiodarone 200 twice daily for 1 more week then on next Evisit will decrease down to 200 mg daily Recommended she decrease metoprolol down to 25 twice daily given bradycardia  Chronic diastolic CHF (congestive heart failure) (HCC) Continue Lasix 40 daily Hesitate to give  additional Lasix afternoon secondary to low blood pressure  Abdominal aortic atherosclerosis ([place])  we will discuss in follow-up Not a major issue at this time  Pure hypercholesterolemia Not an active issue at this time Previous LDL 80  Severe protein-calorie malnutrition (HCC) Dramatic weight loss over the past month or  2 Low albumin 2.2 likely contributing to leg edema, third spacing Recommended try to increase her nutrition  Leg edema Suspect multifactorial including very low albumin, anemia, dependent edema Unable to exclude slight component of diastolic CHF but will be careful with additional Lasix  Anemia, unspecified type Recommended iron pill daily   COVID-19 Education: The signs and symptoms of COVID-19 were discussed with the patient and how to seek care for testing (follow up with PCP or arrange E-visit).  The importance of social distancing was discussed today.  Patient Risk:   After full review of this patients clinical status, I feel that they are at least moderate risk at this time.  Time:   Today, I have spent 25 minutes with the patient with telehealth technology discussing leg edema, diastolic CHF, paroxysmal atrial fibrillation, bradycardia, hypotension, protein calorie malnutrition/low albumin, anemia.     Medication Adjustments/Labs and Tests Ordered: Current medicines are reviewed at length with the patient today.  Concerns regarding medicines are outlined above.   Tests Ordered: No tests ordered   Medication Changes: No changes made   Disposition: Follow-up in 8 days webvisit   Signed, Ida Rogue, MD  09/02/2018 2:02 PM    [facility] [address] [place] [address], [place],  [address]

## 2018-09-02 NOTE — Patient Instructions (Addendum)
Ace wraps,  Compression hose  Medication Instructions:  [facility]  Please decrease the metoprolol down to 25 pill twice a day Stay on amiodarone twice a day We will decrease the dose next week, will discuss on evisit in 8 days   If you need a refill on your cardiac medications before your next appointment, please call your pharmacy.    Lab work: No new labs needed   If you have labs (blood work) drawn today and your tests are completely normal, you will receive your results only by: [name] MyChart Message (if you have MyChart) OR . A paper copy in the mail If you have any lab test that is abnormal or we need to change your treatment, we will call you to review the results.   Testing/Procedures: No new testing needed   Follow-Up: At [facility], you and your health needs are our priority.  As part of our continuing mission to provide you with exceptional heart care, we have created designated Provider Care Teams.  These Care Teams include your primary Cardiologist (physician) and Advanced Practice Providers (APPs -  Physician Assistants and Nurse Practitioners) who all work together to provide you with the care you need, when you need it.  . You will need a follow up appointment in 8 days webvisit  . Providers on your designated Care Team:   . Murray Hodgkins, NP . Christell Faith, PA-C . Marrianne Mood, PA-C  Any Other Special Instructions Will Be Listed Below (If Applicable).  For educational health videos Log in to : [email or web address] Or : [email or web address], password : [email or web address]

## 2018-09-04 ENCOUNTER — Telehealth: Payer: Self-pay | Admitting: Cardiovascular Disease

## 2018-09-04 DIAGNOSIS — I4891 Unspecified atrial fibrillation: Secondary | ICD-10-CM | POA: Diagnosis not present

## 2018-09-04 DIAGNOSIS — Z48815 Encounter for surgical aftercare following surgery on the digestive system: Secondary | ICD-10-CM | POA: Diagnosis not present

## 2018-09-04 DIAGNOSIS — I110 Hypertensive heart disease with heart failure: Secondary | ICD-10-CM | POA: Diagnosis not present

## 2018-09-04 DIAGNOSIS — Z434 Encounter for attention to other artificial openings of digestive tract: Secondary | ICD-10-CM | POA: Diagnosis not present

## 2018-09-04 DIAGNOSIS — I5032 Chronic diastolic (congestive) heart failure: Secondary | ICD-10-CM | POA: Diagnosis not present

## 2018-09-04 DIAGNOSIS — K810 Acute cholecystitis: Secondary | ICD-10-CM | POA: Diagnosis not present

## 2018-09-04 NOTE — Telephone Encounter (Signed)
Please call patient daughter regarding a note she needs to be able to stay at home and take care of patient, her mom.

## 2018-09-06 NOTE — Telephone Encounter (Signed)
Does she have forms from work? FMLA forms?

## 2018-09-07 DIAGNOSIS — K810 Acute cholecystitis: Secondary | ICD-10-CM | POA: Diagnosis not present

## 2018-09-07 DIAGNOSIS — I5032 Chronic diastolic (congestive) heart failure: Secondary | ICD-10-CM | POA: Diagnosis not present

## 2018-09-07 DIAGNOSIS — Z48815 Encounter for surgical aftercare following surgery on the digestive system: Secondary | ICD-10-CM | POA: Diagnosis not present

## 2018-09-07 DIAGNOSIS — I110 Hypertensive heart disease with heart failure: Secondary | ICD-10-CM | POA: Diagnosis not present

## 2018-09-07 DIAGNOSIS — I4891 Unspecified atrial fibrillation: Secondary | ICD-10-CM | POA: Diagnosis not present

## 2018-09-07 DIAGNOSIS — Z434 Encounter for attention to other artificial openings of digestive tract: Secondary | ICD-10-CM | POA: Diagnosis not present

## 2018-09-07 NOTE — Telephone Encounter (Addendum)
Spoke with the pt daughter Butch Penny. Adv her that Dr.Gollan was needing more info regarding her rqst. She sts that she completed a short form on her own and turned it in to her employer. Her employer is now requesting a letter from the pt physicians stating that the pt is high risk as it relates to COVID-19.  Butch Penny is the pt caretaker. She is concerned that she may expose the pt to the virus, and it would be better to limit the amount of places (including work) that she goes.  Greig Castilla that I will fwd the update to Dr. Rockey Situ and call back with his response.

## 2018-09-07 NOTE — Telephone Encounter (Signed)
Ms Rabelo (our patient) is high risk at age 85, and other medical/cardiac issues If she develops covid infection, she has a high risk of mortality

## 2018-09-08 DIAGNOSIS — Z48815 Encounter for surgical aftercare following surgery on the digestive system: Secondary | ICD-10-CM | POA: Diagnosis not present

## 2018-09-08 DIAGNOSIS — I110 Hypertensive heart disease with heart failure: Secondary | ICD-10-CM | POA: Diagnosis not present

## 2018-09-08 DIAGNOSIS — I5032 Chronic diastolic (congestive) heart failure: Secondary | ICD-10-CM | POA: Diagnosis not present

## 2018-09-08 DIAGNOSIS — K810 Acute cholecystitis: Secondary | ICD-10-CM | POA: Diagnosis not present

## 2018-09-08 DIAGNOSIS — I4891 Unspecified atrial fibrillation: Secondary | ICD-10-CM | POA: Diagnosis not present

## 2018-09-08 DIAGNOSIS — Z434 Encounter for attention to other artificial openings of digestive tract: Secondary | ICD-10-CM | POA: Diagnosis not present

## 2018-09-09 DIAGNOSIS — I5032 Chronic diastolic (congestive) heart failure: Secondary | ICD-10-CM | POA: Diagnosis not present

## 2018-09-09 DIAGNOSIS — Z48815 Encounter for surgical aftercare following surgery on the digestive system: Secondary | ICD-10-CM | POA: Diagnosis not present

## 2018-09-09 DIAGNOSIS — K810 Acute cholecystitis: Secondary | ICD-10-CM | POA: Diagnosis not present

## 2018-09-09 DIAGNOSIS — I110 Hypertensive heart disease with heart failure: Secondary | ICD-10-CM | POA: Diagnosis not present

## 2018-09-09 DIAGNOSIS — I4891 Unspecified atrial fibrillation: Secondary | ICD-10-CM | POA: Diagnosis not present

## 2018-09-09 DIAGNOSIS — Z434 Encounter for attention to other artificial openings of digestive tract: Secondary | ICD-10-CM | POA: Diagnosis not present

## 2018-09-10 ENCOUNTER — Other Ambulatory Visit: Payer: Self-pay

## 2018-09-10 ENCOUNTER — Telehealth (INDEPENDENT_AMBULATORY_CARE_PROVIDER_SITE_OTHER): Payer: Medicare Other | Admitting: Cardiovascular Disease

## 2018-09-10 DIAGNOSIS — D649 Anemia, unspecified: Secondary | ICD-10-CM | POA: Diagnosis not present

## 2018-09-10 DIAGNOSIS — Z48815 Encounter for surgical aftercare following surgery on the digestive system: Secondary | ICD-10-CM | POA: Diagnosis not present

## 2018-09-10 DIAGNOSIS — I700 Atherosclerosis of aorta: Secondary | ICD-10-CM | POA: Diagnosis not present

## 2018-09-10 DIAGNOSIS — I4891 Unspecified atrial fibrillation: Secondary | ICD-10-CM | POA: Diagnosis not present

## 2018-09-10 DIAGNOSIS — Z434 Encounter for attention to other artificial openings of digestive tract: Secondary | ICD-10-CM | POA: Diagnosis not present

## 2018-09-10 DIAGNOSIS — E43 Unspecified severe protein-calorie malnutrition: Secondary | ICD-10-CM | POA: Diagnosis not present

## 2018-09-10 DIAGNOSIS — I5032 Chronic diastolic (congestive) heart failure: Secondary | ICD-10-CM | POA: Diagnosis not present

## 2018-09-10 DIAGNOSIS — K810 Acute cholecystitis: Secondary | ICD-10-CM | POA: Diagnosis not present

## 2018-09-10 DIAGNOSIS — I110 Hypertensive heart disease with heart failure: Secondary | ICD-10-CM | POA: Diagnosis not present

## 2018-09-10 MED ORDER — ROSUVASTATIN CALCIUM 20 MG PO TABS: 20.0000 mg | ORAL_TABLET | Freq: Every day | ORAL | 3 refills | Status: DC

## 2018-09-10 MED ORDER — FUROSEMIDE 40 MG PO TABS: 40.0000 mg | ORAL_TABLET | Freq: Every day | ORAL | 3 refills | Status: DC

## 2018-09-10 MED ORDER — POTASSIUM CHLORIDE ER 10 MEQ PO TBCR: 10.0000 meq | EXTENDED_RELEASE_TABLET | Freq: Every day | ORAL | 3 refills | Status: DC

## 2018-09-10 MED ORDER — APIXABAN 2.5 MG PO TABS: 2.5000 mg | ORAL_TABLET | Freq: Two times a day (BID) | ORAL | 3 refills | Status: DC

## 2018-09-10 MED ORDER — METOPROLOL TARTRATE 50 MG PO TABS: 25.0000 mg | ORAL_TABLET | Freq: Two times a day (BID) | ORAL | 3 refills | Status: DC

## 2018-09-10 MED ORDER — OMEPRAZOLE 40 MG PO CPDR: 40.0000 mg | DELAYED_RELEASE_CAPSULE | Freq: Every day | ORAL | 3 refills | Status: DC

## 2018-09-10 MED ORDER — AMIODARONE HCL 200 MG PO TABS: 200.0000 mg | ORAL_TABLET | Freq: Two times a day (BID) | ORAL | 3 refills | Status: DC

## 2018-09-10 NOTE — Telephone Encounter (Signed)
Spoke with patients daughter regarding letter for her to present to her job. Reviewed that I would be happy to send her this information through [employer] and if that does not work to let me know. She is aware of her mothers appointment today with provider and requesting 90 day refills sent to mail order pharmacy. She was appreciative for the call with no further questions at this time.

## 2018-09-10 NOTE — Progress Notes (Addendum)
Virtual Visit via Video Note   This visit type was conducted due to national recommendations for restrictions regarding the COVID-19 Pandemic (e.g. social distancing) in an effort to limit this patient's exposure and mitigate transmission in our community.  Due to her co-morbid illnesses, this patient is at least at moderate risk for complications without adequate follow up.  This format is felt to be most appropriate for this patient at this time.  All issues noted in this document were discussed and addressed.  A limited physical exam was performed with this format.  Please refer to the patient's chart for her consent to telehealth for [facility].    Date:  09/10/2018   ID:  Kylie May, DOB 1921-11-06, MRN [id number]  Patient Location:  [place] [address]   Provider location:   [facility], [place] office  PCP:  [name], MD  Cardiologist:  [name] [facility]  Chief Complaint: Lower extremity edema, weakness WebCam used   History of Present Illness:    Kylie May is a 85 y.o. female who presents via audio/video conferencing for a telehealth visit today.   The patient does not symptoms concerning for COVID-19 infection (fever, chills, cough, or new SHORTNESS OF BREATH).   Patient has a past medical history of paroxysmal asymptomatic a-fib,  HTN,  hiatal hernia,  osteopenia,  degenerative joint disease, and  Scoliosis Long history of falls, 2018 prior fall with pelvic fracture admitted to [facility] 1/7-1/10/16 for new onset a-fibAndmild diastolic CHF, who presents today for follow-up of her atrial fibrillation,aortic atherosclerosis  Hospitalization in March 2020 requiring ERCP develop A. fib with RVR complicated by decompensated heart failure with fluid retention. continued on anticoagulation with apixaban amiodarone metoprolol  Significant lower extremity edema on last telephone visit 8 days ago Albumin 2.2,  HCT low, drop in the past month hemoglobin 10.7 CR 1.3  Vitals obtained for today's visit Blood pressure: 97/63, 102/71 standing 099 systolic after activity  833/82 Pulse rate 60s Weight is 110, down from 120 Family focusing hard on nutrition, iron daily, continues to take Lasix 40 daily Overall feeling much better Denies any tachycardia or palpitations concerning for atrial fibrillation Was normal sinus rhythm on last EKG reviewed personally on today's visit No falls  She is very hard of hearing, poor historian  Seen by primary care August 31, 2018 Albumin 2.2, HCT low, drop in the past month     On previous visit Was on Crestor 3 days a week Total cholesterol 170  Prior office visit had a fall with pelvic fracture  Other past medical history reviewed severe epigastric pain from 08/15/2018-08/26/2018 CT of her abdomen and pelvis which showed intra-and extrahepatic ductal dilatation and gallbladder wall thickening but no stones  transferred to [facility] for ERCP. She underwent cholecystostomy tube placement by interventional radiology and subsequently underwent ERCP which showed dilatation of the bile duct and cholelithiasis. She had the stone removed and had a biliary sphincterectomy performed and a plastic stent was placed in the pancreatic duct  develop A. fib with RVR complicated by decompensated heart failure with fluid retention. continued on anticoagulation with apixaban amiodarone metoprolol placed on oral Lasix   Prior CV studies:   The following studies were reviewed today:    Past Medical History:  Diagnosis Date  . A-fib ([place])   . Acid reflux disease   . Arrhythmia   . Atrial fibrillation with RVR ([place]) 08/15/2018  . Chronic  diastolic CHF (congestive heart failure) ([place])   . Compression fracture of lumbar vertebra ([place])   . Diverticulitis 01-2008   [facility]  . Diverticulosis   . Esophageal stricture 2006  . Femoral bruit     Right  . Fibrocystic breast disease   . Hiatal hernia   . Hypercholesterolemia    [place] study LDL goal = < 160  . Mitral regurgitation    a. 06/2014 EF 55-60%, elevated end-diastolic pressures, dilated LA at 4.3 cm, mildly dilated RA, severe mitral regurgitation, mild aortic sclerosis without stenosis, mod-severe TR  . Mitral valve prolapse   . Osteoporosis    Dr Matthew Saras  . Pancreatitis 07-2008   Hospitalized   . Schatzki's ring   . Sepsis ([place]) 08/15/2018  . Skin cancer    facial x 2. [name]   Past Surgical History:  Procedure Laterality Date  . BILIARY STENT PLACEMENT  08/21/2018   Procedure: BILIARY STENT PLACEMENT;  Surgeon: [name], MD;  Location: [facility] ENDOSCOPY;  Service: Gastroenterology;;  . COLONOSCOPY  2006  . Epidural steroids     x1  . ERCP N/A 08/21/2018   Procedure: ENDOSCOPIC RETROGRADE CHOLANGIOPANCREATOGRAPHY (ERCP);  Surgeon: [name], MD;  Location: [place];  Service: Gastroenterology;  Laterality: N/A;  . ESOPHAGEAL DILATION  2006  . G3 P4 (twins)    . IR CHOLANGIOGRAM EXISTING TUBE  08/18/2018  . IR PERC CHOLECYSTOSTOMY  08/16/2018  . REMOVAL OF STONES  08/21/2018   Procedure: REMOVAL OF STONES;  Surgeon: [name], MD;  Location: [facility];  Service: Gastroenterology;;  . SKIN CANCER EXCISION    . SPHINCTEROTOMY  08/21/2018   Procedure: SPHINCTEROTOMY;  Surgeon: [name], MD;  Location: [facility] ENDOSCOPY;  Service: Gastroenterology;;     No outpatient medications have been marked as taking for the 09/10/18 encounter (Appointment) with Minna Merritts, MD.     Allergies:   Metronidazole and Penicillin g   Social History   Tobacco Use  . Smoking status: Former Smoker    Last attempt to quit: 06/04/1951    Years since quitting: 67.3  . Smokeless tobacco: Never Used  . Tobacco comment: Smoked 1945-1953 , up to 2 cigarettes/day  Substance Use Topics  . Alcohol use: Yes    Alcohol/week: 7.0 standard drinks    Types: 7 Glasses of wine per  week  . Drug use: No     Current Outpatient Medications on File Prior to Visit  Medication Sig Dispense Refill  . amiodarone (PACERONE) 200 MG tablet Take 1 tablet (200 mg total) by mouth 2 (two) times daily. 60 tablet 2  . apixaban (ELIQUIS) 2.5 MG TABS tablet Take 1 tablet (2.5 mg total) by mouth 2 (two) times daily. 180 tablet 3  . furosemide (LASIX) 40 MG tablet Take 1 tablet (40 mg total) by mouth daily. 30 tablet 2  . magnesium chloride (SLOW-MAG) 64 MG TBEC SR tablet Take 2 tablets (128 mg total) by mouth daily. 60 tablet 3  . metoprolol tartrate (LOPRESSOR) 50 MG tablet Take 0.5 tablets (25 mg total) by mouth 2 (two) times daily.    [name] omeprazole (PRILOSEC) 40 MG capsule Take 1 capsule (40 mg total) by mouth daily for 30 days. 90 capsule 0  . potassium chloride (KLOR-CON 10) 10 MEQ tablet Take 1 tablet (10 mEq total) by mouth daily. With lasix. 30 tablet 2  . rosuvastatin (CRESTOR) 20 MG tablet Take 1 tablet (20 mg total) by mouth at bedtime. 90 tablet 3  .  triamcinolone ointment (KENALOG) 0.1 % Apply 1 application topically 2 (two) times daily as needed (rash).      No current facility-administered medications on file prior to visit.      Family Hx: The patient's family history includes Breast cancer in her maternal aunt; Heart attack (age of onset: 67) in her daughter; Heart attack (age of onset: 60) in her sister; Hepatitis in her daughter; Lung disease in her mother. There is no history of Diabetes or Stroke.  ROS:   Please see the history of present illness.    Review of Systems  Constitutional: Negative.   HENT: Positive for hearing loss.   Respiratory: Positive for shortness of breath.   Cardiovascular: Positive for leg swelling.  Gastrointestinal: Negative.   Musculoskeletal: Negative.        Weakness, gait instability  Neurological: Negative.   Psychiatric/Behavioral: Negative.   All other systems reviewed and are negative.     Labs/Other Tests and Data Reviewed:     Recent Labs: 08/14/2018: TSH 9.124 08/15/2018: B Natriuretic Peptide 1,005.7 08/19/2018: ALT 46 08/24/2018: Hemoglobin 10.7; Platelets 303 08/25/2018: Magnesium 1.7 08/26/2018: Potassium 3.8 08/28/2018: BUN 17; Creatinine 1.3; Sodium 137   Recent Lipid Panel Lab Results  Component Value Date/Time   CHOL 173 01/14/2017 03:12 PM   TRIG 121.0 01/14/2017 03:12 PM   HDL 67.30 01/14/2017 03:12 PM   CHOLHDL 3 01/14/2017 03:12 PM   LDLCALC 81 01/14/2017 03:12 PM   LDLDIRECT 121.4 06/17/2011 11:46 AM    Wt Readings from Last 3 Encounters:  08/26/18 120 lb 14.4 oz (54.8 kg)  08/15/18 105 lb 14.4 oz (48 kg)  07/13/18 102 lb 6.4 oz (46.4 kg)     Exam:    Vital Signs: Vital signs may also be detailed in the HPI There were no vitals taken for this visit.  Wt Readings from Last 3 Encounters:  08/26/18 120 lb 14.4 oz (54.8 kg)  08/15/18 105 lb 14.4 oz (48 kg)  07/13/18 102 lb 6.4 oz (46.4 kg)   Temp Readings from Last 3 Encounters:  08/26/18 97.8 F (36.6 C) (Oral)  08/16/18 98.3 F (36.8 C) (Oral)  07/13/18 (!) 97.5 F (36.4 C) (Oral)   BP Readings from Last 3 Encounters:  08/26/18 102/67  08/16/18 (!) 86/65  07/13/18 118/70   Pulse Readings from Last 3 Encounters:  08/26/18 68  08/16/18 (!) 141  07/13/18 70  Vitals today Blood pressure: 97/63, 102/71 standing 366 systolic after activity  440/34 Pulse rate 60s Weight is 110, down from 120  Well nourished, well developed female in no acute distress. Constitutional:  oriented to person, place, and time. No distress.  Head: Normocephalic and atraumatic.  Eyes:  no discharge. No scleral icterus.  Neck: Normal range of motion. Neck supple.  1+ pitting lower extremity edema to the thighs Pulmonary/Chest: No audible wheezing, no distress, appears comfortable Musculoskeletal: Normal range of motion.  no  tenderness or deformity.  Neurological:   Coordination normal. Full exam not performed Skin:  No rash Psychiatric:   normal mood and affect. behavior is normal. Thought content normal.    ASSESSMENT & PLAN:    Paroxysmal atrial fibrillation ([place]) Family prefer not to change her amiodarone on today's visit  On her next clinic visit we will decrease amiodarone down to 200 mg daily Continue metoprolol tartrate 25 twice daily  Chronic diastolic CHF (congestive heart failure) ([facility]) Continue Lasix 40 daily Likely has additional 5 pounds to go Weight is 110 pounds, still with  significant swelling Follow-up lab work in 1 week CMP, CBC Encouraged nutrition, iron pill  Abdominal aortic atherosclerosis ([facility])  No further work-up needed given age  Pure hypercholesterolemia Previous LDL 80 No further work-up  Severe protein-calorie malnutrition ([facility]) Complete metabolic panel scheduled for next week Diet is improving, on iron pill  Leg edema Suspect multifactorial including very low albumin, anemia, dependent edema Continue Lasix 40 daily   COVID-19 Education: The signs and symptoms of COVID-19 were discussed with the patient and how to seek care for testing (follow up with PCP or arrange E-visit).  The importance of social distancing was discussed today.  Patient Risk:   After full review of this patients clinical  status, I feel that they are at least moderate risk at this time.  Time:   Today, I have spent 25 minutes with the patient with telehealth technology discussing the cardiac and medical problems/diagnoses detailed above   Discussed causes of lower extremity edema, discussed blood pressure measurements Management of anemia, malnutrition   Medication Adjustments/Labs and Tests Ordered: Current medicines are reviewed at length with the patient today.  Concerns regarding medicines are outlined above.   Tests Ordered: No tests ordered   Medication Changes: No changes made   Disposition: Follow-up in 10 days   Signed, Ida Rogue, MD  09/10/2018 2:34 PM    [facility] [address], [place], [place] [address]

## 2018-09-10 NOTE — Patient Instructions (Addendum)
Medication Instructions:  No med changes  If you need a refill on your cardiac medications before your next appointment, please call your pharmacy.    Lab work: Ermalinda Barrios: with Company secretary, visiting nurse Needs CBC and CMP in 7 days, through home visit As patient unable to get out to go to lab   If you have labs (blood work) drawn today and your tests are completely normal, you will receive your results only by: [name] MyChart Message (if you have MyChart) OR . A paper copy in the mail If you have any lab test that is abnormal or we need to change your treatment, we will call you to review the results.   Testing/Procedures: No new testing needed   Follow-Up: At [facility], you and your health needs are our priority.  As part of our continuing mission to provide you with exceptional heart care, we have created designated Provider Care Teams.  These Care Teams include your primary Cardiologist (physician) and Advanced Practice Providers (APPs -  Physician Assistants and Nurse Practitioners) who all work together to provide you with the care you need, when you need it.  . You will need a follow up appointment in 10 days, to go over lab results  Follow up VIDEO VISIT scheduled for 09/24/2018 at 2:00 PM  . Providers on your designated Care Team:   . Murray Hodgkins, NP . Christell Faith, PA-C . Marrianne Mood, PA-C  Any Other Special Instructions Will Be Listed Below (If Applicable).  For educational health videos Log in to : [email or web address] Or : [email or web address], password : [email or web address]

## 2018-09-14 DIAGNOSIS — Z434 Encounter for attention to other artificial openings of digestive tract: Secondary | ICD-10-CM | POA: Diagnosis not present

## 2018-09-14 DIAGNOSIS — I4891 Unspecified atrial fibrillation: Secondary | ICD-10-CM | POA: Diagnosis not present

## 2018-09-14 DIAGNOSIS — I5032 Chronic diastolic (congestive) heart failure: Secondary | ICD-10-CM | POA: Diagnosis not present

## 2018-09-14 DIAGNOSIS — K810 Acute cholecystitis: Secondary | ICD-10-CM | POA: Diagnosis not present

## 2018-09-14 DIAGNOSIS — Z48815 Encounter for surgical aftercare following surgery on the digestive system: Secondary | ICD-10-CM | POA: Diagnosis not present

## 2018-09-14 DIAGNOSIS — I110 Hypertensive heart disease with heart failure: Secondary | ICD-10-CM | POA: Diagnosis not present

## 2018-09-15 ENCOUNTER — Ambulatory Visit: Payer: Medicare Other | Admitting: Cardiovascular Disease

## 2018-09-15 DIAGNOSIS — Z48815 Encounter for surgical aftercare following surgery on the digestive system: Secondary | ICD-10-CM | POA: Diagnosis not present

## 2018-09-15 DIAGNOSIS — Z434 Encounter for attention to other artificial openings of digestive tract: Secondary | ICD-10-CM | POA: Diagnosis not present

## 2018-09-15 DIAGNOSIS — I4891 Unspecified atrial fibrillation: Secondary | ICD-10-CM | POA: Diagnosis not present

## 2018-09-15 DIAGNOSIS — I110 Hypertensive heart disease with heart failure: Secondary | ICD-10-CM | POA: Diagnosis not present

## 2018-09-15 DIAGNOSIS — I5032 Chronic diastolic (congestive) heart failure: Secondary | ICD-10-CM | POA: Diagnosis not present

## 2018-09-15 DIAGNOSIS — K810 Acute cholecystitis: Secondary | ICD-10-CM | POA: Diagnosis not present

## 2018-09-16 DIAGNOSIS — K810 Acute cholecystitis: Secondary | ICD-10-CM | POA: Diagnosis not present

## 2018-09-16 DIAGNOSIS — Z434 Encounter for attention to other artificial openings of digestive tract: Secondary | ICD-10-CM | POA: Diagnosis not present

## 2018-09-16 DIAGNOSIS — Z48815 Encounter for surgical aftercare following surgery on the digestive system: Secondary | ICD-10-CM | POA: Diagnosis not present

## 2018-09-16 DIAGNOSIS — I110 Hypertensive heart disease with heart failure: Secondary | ICD-10-CM | POA: Diagnosis not present

## 2018-09-16 DIAGNOSIS — I4891 Unspecified atrial fibrillation: Secondary | ICD-10-CM | POA: Diagnosis not present

## 2018-09-16 DIAGNOSIS — I5032 Chronic diastolic (congestive) heart failure: Secondary | ICD-10-CM | POA: Diagnosis not present

## 2018-09-17 DIAGNOSIS — I4891 Unspecified atrial fibrillation: Secondary | ICD-10-CM | POA: Diagnosis not present

## 2018-09-17 DIAGNOSIS — I110 Hypertensive heart disease with heart failure: Secondary | ICD-10-CM | POA: Diagnosis not present

## 2018-09-17 DIAGNOSIS — Z434 Encounter for attention to other artificial openings of digestive tract: Secondary | ICD-10-CM | POA: Diagnosis not present

## 2018-09-17 DIAGNOSIS — I5032 Chronic diastolic (congestive) heart failure: Secondary | ICD-10-CM | POA: Diagnosis not present

## 2018-09-17 DIAGNOSIS — Z48815 Encounter for surgical aftercare following surgery on the digestive system: Secondary | ICD-10-CM | POA: Diagnosis not present

## 2018-09-17 DIAGNOSIS — K810 Acute cholecystitis: Secondary | ICD-10-CM | POA: Diagnosis not present

## 2018-09-24 ENCOUNTER — Other Ambulatory Visit: Payer: Self-pay

## 2018-09-24 ENCOUNTER — Telehealth (INDEPENDENT_AMBULATORY_CARE_PROVIDER_SITE_OTHER): Payer: Medicare Other | Admitting: Cardiovascular Disease

## 2018-09-24 DIAGNOSIS — E43 Unspecified severe protein-calorie malnutrition: Secondary | ICD-10-CM | POA: Diagnosis not present

## 2018-09-24 DIAGNOSIS — R600 Localized edema: Secondary | ICD-10-CM

## 2018-09-24 DIAGNOSIS — I4891 Unspecified atrial fibrillation: Secondary | ICD-10-CM | POA: Diagnosis not present

## 2018-09-24 DIAGNOSIS — E7800 Pure hypercholesterolemia, unspecified: Secondary | ICD-10-CM

## 2018-09-24 DIAGNOSIS — I700 Atherosclerosis of aorta: Secondary | ICD-10-CM

## 2018-09-24 DIAGNOSIS — I5032 Chronic diastolic (congestive) heart failure: Secondary | ICD-10-CM

## 2018-09-24 MED ORDER — METOPROLOL TARTRATE 25 MG PO TABS: 25.0000 mg | ORAL_TABLET | Freq: Two times a day (BID) | ORAL | 3 refills | Status: DC

## 2018-09-24 MED ORDER — AMIODARONE HCL 200 MG PO TABS: 200.0000 mg | ORAL_TABLET | Freq: Every day | ORAL | 3 refills | Status: DC

## 2018-09-24 NOTE — Progress Notes (Signed)
Virtual Visit via Video Note   This visit type was conducted due to national recommendations for restrictions regarding the COVID-19 Pandemic (e.g. social distancing) in an effort to limit this patient's exposure and mitigate transmission in our community.  Due to her co-morbid illnesses, this patient is at least at moderate risk for complications without adequate follow up.  This format is felt to be most appropriate for this patient at this time.  All issues noted in this document were discussed and addressed.  A limited physical exam was performed with this format.  Please refer to the patient's chart for her consent to telehealth for [facility].   I connected with  ISELLA SLATTEN on 09/24/18 by a video enabled telemedicine application and verified that I am speaking with the correct person using two identifiers. I discussed the limitations of evaluation and management by telemedicine. The patient expressed understanding and agreed to proceed.   Evaluation Performed:  Follow-up visit  Date:  09/24/2018   ID:  MILANY GECK, DOB 1921/09/28, MRN [id number]  Patient Location:  [place] [address]   Provider location:   [facility], [place] office  PCP:  [name], MD  Cardiologist:  [name] [facility]   Chief Complaint:  Marin Comment edema, biliary drain  History of Present Illness:    CEASIA ELWELL is a 85 y.o. female who presents via audio/video conferencing for a telehealth visit today.   The patient does not symptoms concerning for COVID-19 infection (fever, chills, cough, or new SHORTNESS OF BREATH).   Patient has a past medical history of paroxysmal asymptomatic a-fib,  HTN,  hiatal hernia,  osteopenia,  degenerative joint disease,  Scoliosis Long history of falls, 2018 prior fall with pelvic fracture admitted to [facility] 1/7-1/10/16 for new onset a-fibAndmild diastolic CHF, who presents today for follow-up of her  atrial fibrillation,aortic atherosclerosis   very hard of hearing,  Family present to relay all information  Seen on a visit last week At that time several medication changes were made  metoprolol down to 25 BID for bradycardia Lasix 40 daily  Since that time her weight has continued to drop slightly, family has decrease Lasix down to 20 mg daily, sometimes skipping a day of Lasix  Overall feeling better, eating well, slowly getting her strength back, walks with a walker Denies any falls Prior  fall with pelvic fracture  Vitals discussed Pulse: mid 50s BP: 103-110/50s  Still with biliary drain in Scheduled with interventional radiology next week  No symptoms of atrial fibrillation Amiodarone previously held for elevated TSH Still taking amiodarone 200 twice daily, family very cautious about decreasing the dose  Other past medical history reviewed severe epigastric pain from 08/15/2018-08/26/2018 CT of her abdomen and pelvis which showed intra-and extrahepatic ductal dilatation and gallbladder wall thickening but no stones  transferred to [facility] for ERCP. She underwent cholecystostomy tube placement by interventional radiology and subsequently underwent ERCP which showed dilatation of the bile duct and cholelithiasis. She had the stone removed and had a biliary sphincterectomy performed and a plastic stent was placed in the pancreatic duct  develop A. fib with RVR complicated by decompensated heart failure with fluid retention. continued on anticoagulation with apixaban amiodarone metoprolol placed on oral Lasix  Low blood pressure, low heart rate draining the cholecystostomy bag. Family does not know where to get ostomy supplies  Seen by primary care August 31, 2018 Albumin 2.2, HCT low,  drop in the past month    Prior CV studies:   The following studies were reviewed today:    Past Medical History:  Diagnosis Date   A-fib ([place])    Acid  reflux disease    Arrhythmia    Atrial fibrillation with RVR ([place]) 08/15/2018   Chronic diastolic CHF (congestive heart failure) (HCC)    Compression fracture of lumbar vertebra (HCC)    Diverticulitis 01-2008   [facility]   Diverticulosis    Esophageal stricture 2006   Femoral bruit    Right   Fibrocystic breast disease    Hiatal hernia    Hypercholesterolemia    [place] study LDL goal = < 160   Mitral regurgitation    a. 06/2014 EF 55-60%, elevated end-diastolic pressures, dilated LA at 4.3 cm, mildly dilated RA, severe mitral regurgitation, mild aortic sclerosis without stenosis, mod-severe TR   Mitral valve prolapse    Osteoporosis    Dr Matthew Saras   Pancreatitis 07-2008   Hospitalized    Schatzki's ring    Sepsis ([place]) 08/15/2018   Skin cancer    facial x 2. [name]   Past Surgical History:  Procedure Laterality Date   BILIARY STENT PLACEMENT  08/21/2018   Procedure: BILIARY STENT PLACEMENT;  Surgeon: [name], MD;  Location: [facility] ENDOSCOPY;  Service: Gastroenterology;;   COLONOSCOPY  2006   Epidural steroids     x1   ERCP N/A 08/21/2018   Procedure: ENDOSCOPIC RETROGRADE CHOLANGIOPANCREATOGRAPHY (ERCP);  Surgeon: [name], MD;  Location: [place];  Service: Gastroenterology;  Laterality: N/A;   ESOPHAGEAL DILATION  2006   G3 P4 (twins)     IR CHOLANGIOGRAM EXISTING TUBE  08/18/2018   IR PERC CHOLECYSTOSTOMY  08/16/2018   REMOVAL OF STONES  08/21/2018   Procedure: REMOVAL OF STONES;  Surgeon: [name], MD;  Location: [place];  Service: Gastroenterology;;   SKIN CANCER EXCISION     SPHINCTEROTOMY  08/21/2018   Procedure: Joan Mayans;  Surgeon: [name], MD;  Location: [place];  Service: Gastroenterology;;     No outpatient medications have been marked as taking for the 09/24/18 encounter (Appointment) with Minna Merritts, MD.     Allergies:   Metronidazole and Penicillin g   Social History   Tobacco Use    Smoking status: Former Smoker    Last attempt to quit: 06/04/1951    Years since quitting: 67.3   Smokeless tobacco: Never Used   Tobacco comment: Smoked 1945-1953 , up to 2 cigarettes/day  Substance Use Topics   Alcohol use: Yes    Alcohol/week: 7.0 standard drinks    Types: 7 Glasses of wine per week   Drug use: No     Current Outpatient Medications on File Prior to Visit  Medication Sig Dispense Refill   amiodarone (PACERONE) 200 MG tablet Take 1 tablet (200 mg total) by mouth 2 (two) times daily. 180 tablet 3   apixaban (ELIQUIS) 2.5 MG TABS tablet Take 1 tablet (2.5 mg total) by mouth 2 (two) times daily. 180 tablet 3   furosemide (LASIX) 40 MG tablet Take 1 tablet (40 mg total) by mouth daily. 90 tablet 3   magnesium chloride (SLOW-MAG) 64 MG TBEC SR tablet Take 2 tablets (128 mg total) by mouth daily. 60 tablet 3   metoprolol tartrate (LOPRESSOR) 50 MG tablet Take 0.5 tablets (25 mg total) by mouth 2 (two) times daily. 180 tablet 3   omeprazole (PRILOSEC) 40 MG capsule Take 1 capsule (40  mg total) by mouth daily. 90 capsule 3   potassium chloride (KLOR-CON 10) 10 MEQ tablet Take 1 tablet (10 mEq total) by mouth daily. With lasix. 90 tablet 3   rosuvastatin (CRESTOR) 20 MG tablet Take 1 tablet (20 mg total) by mouth at bedtime. 90 tablet 3   triamcinolone ointment (KENALOG) 0.1 % Apply 1 application topically 2 (two) times daily as needed (rash).      No current facility-administered medications on file prior to visit.      Family Hx: The patient's family history includes Breast cancer in her maternal aunt; Heart attack (age of onset: 50) in her daughter; Heart attack (age of onset: 34) in her sister; Hepatitis in her daughter; Lung disease in her mother. There is no history of Diabetes or Stroke.  ROS:   Please see the history of present illness.    Review of Systems  Constitutional: Negative.   Respiratory: Negative.   Cardiovascular: Positive for leg swelling.    Gastrointestinal: Negative.   Musculoskeletal: Negative.   Neurological: Negative.   Psychiatric/Behavioral: Negative.   All other systems reviewed and are negative.     Labs/Other Tests and Data Reviewed:    Recent Labs: 08/14/2018: TSH 9.124 08/15/2018: B Natriuretic Peptide 1,005.7 08/19/2018: ALT 46 08/24/2018: Hemoglobin 10.7; Platelets 303 08/25/2018: Magnesium 1.7 08/26/2018: Potassium 3.8 08/28/2018: BUN 17; Creatinine 1.3; Sodium 137   Recent Lipid Panel Lab Results  Component Value Date/Time   CHOL 173 01/14/2017 03:12 PM   TRIG 121.0 01/14/2017 03:12 PM   HDL 67.30 01/14/2017 03:12 PM   CHOLHDL 3 01/14/2017 03:12 PM   LDLCALC 81 01/14/2017 03:12 PM   LDLDIRECT 121.4 06/17/2011 11:46 AM    Wt Readings from Last 3 Encounters:  08/26/18 120 lb 14.4 oz (54.8 kg)  08/15/18 105 lb 14.4 oz (48 kg)  07/13/18 102 lb 6.4 oz (46.4 kg)     Exam:    Vital Signs: Vital signs may also be detailed in the HPI There were no vitals taken for this visit.  Wt Readings from Last 3 Encounters:  08/26/18 120 lb 14.4 oz (54.8 kg)  08/15/18 105 lb 14.4 oz (48 kg)  07/13/18 102 lb 6.4 oz (46.4 kg)   Temp Readings from Last 3 Encounters:  08/26/18 97.8 F (36.6 C) (Oral)  08/16/18 98.3 F (36.8 C) (Oral)  07/13/18 (!) 97.5 F (36.4 C) (Oral)   BP Readings from Last 3 Encounters:  08/26/18 102/67  08/16/18 (!) 86/65  07/13/18 118/70   Pulse Readings from Last 3 Encounters:  08/26/18 68  08/16/18 (!) 141  07/13/18 70     Well nourished, well developed female in no acute distress. Constitutional:  oriented to person, place, and time. No distress.  Head: Normocephalic and atraumatic.  Eyes:  no discharge. No scleral icterus.  Neck: Normal range of motion. Neck supple.  Pulmonary/Chest: No audible wheezing, no distress, appears comfortable Musculoskeletal: Normal range of motion.  no  tenderness or deformity.  Neurological:   Coordination normal. Full exam not  performed Skin:  No rash Psychiatric:  normal mood and affect. behavior is normal. Thought content normal.    ASSESSMENT & PLAN:    Paroxysmal atrial fibrillation (HCC)  amiodarone down to 200 mg daily Continue metoprolol tartrate 25 twice daily Long discussion with family concerning need to decrease the amiodarone  Chronic diastolic CHF (congestive heart failure) (HCC) Continue Lasix 20-40 as needed for lower extremity edema Edema has resolved with better nutrition, resolution of her anemia  Abdominal aortic atherosclerosis (HCC) No further work-up needed given age  Pure hypercholesterolemia Previous LDL 80 Stable  Severe protein-calorie malnutrition (HCC) Improving diet, This has helped lower extremity edema/anasarca  Leg edema Recent very low albumin, anemia, dependent edema Symptoms improved with Lasix, better nutrition   COVID-19 Education: The signs and symptoms of COVID-19 were discussed with the patient and how to seek care for testing (follow up with PCP or arrange E-visit).  The importance of social distancing was discussed today.  Patient Risk:   After full review of this patients clinical status, I feel that they are at least moderate risk at this time.  Time:   Today, I have spent 25 minutes with the patient with telehealth technology discussing the cardiac and medical problems/diagnoses detailed above   10 min spent reviewing the chart prior to patient visit today   Medication Adjustments/Labs and Tests Ordered: Current medicines are reviewed at length with the patient today.  Concerns regarding medicines are outlined above.   Tests Ordered: No tests ordered   Medication Changes: No changes made   Disposition: Follow-up in 6 months   Signed, Ida Rogue, MD  09/24/2018 2:24 PM    [place] [address], [place], [place] [address]

## 2018-09-24 NOTE — Patient Instructions (Addendum)
Medication Instructions:  Your physician has recommended you make the following change in your medication:  1. DECREASE Amiodarone to 200 mg once daily 2. DECREASE Metoprolol tartrate down to 1 tablet 25 mg twice a day 3. AS NEEDED Furosemide 40 mg and Potassium 10 mEq daily as needed for swelling   If you need a refill on your cardiac medications before your next appointment, please call your pharmacy.    Lab work: No new labs needed   If you have labs (blood work) drawn today and your tests are completely normal, you will receive your results only by: [name] MyChart Message (if you have MyChart) OR . A paper copy in the mail If you have any lab test that is abnormal or we need to change your treatment, we will call you to review the results.   Testing/Procedures: No new testing needed   Follow-Up: At [facility], you and your health needs are our priority.  As part of our continuing mission to provide you with exceptional heart care, we have created designated Provider Care Teams.  These Care Teams include your primary Cardiologist (physician) and Advanced Practice Providers (APPs -  Physician Assistants and Nurse Practitioners) who all work together to provide you with the care you need, when you need it.  . You will need a follow up appointment in 3 month  . Providers on your designated Care Team:   . Murray Hodgkins, NP . Christell Faith, PA-C . Marrianne Mood, PA-C  Any Other Special Instructions Will Be Listed Below (If Applicable).  For educational health videos Log in to : [email or web address] Or : [email or web address], password : [email or web address]

## 2018-09-25 ENCOUNTER — Encounter: Payer: Self-pay | Admitting: Family Medicine

## 2018-09-25 ENCOUNTER — Ambulatory Visit: Payer: Medicare Other | Admitting: Family Medicine

## 2018-09-25 ENCOUNTER — Other Ambulatory Visit: Payer: Self-pay

## 2018-09-25 DIAGNOSIS — K8042 Calculus of bile duct with acute cholecystitis without obstruction: Secondary | ICD-10-CM

## 2018-09-25 DIAGNOSIS — D649 Anemia, unspecified: Secondary | ICD-10-CM

## 2018-09-25 DIAGNOSIS — I5032 Chronic diastolic (congestive) heart failure: Secondary | ICD-10-CM

## 2018-09-25 DIAGNOSIS — E43 Unspecified severe protein-calorie malnutrition: Secondary | ICD-10-CM

## 2018-09-25 NOTE — Progress Notes (Signed)
Virtual Visit via video note  This visit type was conducted due to national recommendations for restrictions regarding the COVID-19 pandemic (e.g. social distancing).  This format is felt to be most appropriate for this patient at this time.  All issues noted in this document were discussed and addressed.  No physical exam was performed (except for noted visual exam findings with Video Visits).   I connected with on 09/26/18 at  2:45 PM EDT by a video enabled telemedicine application and verified that I am speaking with the correct person using two identifiers. Location patient: home Location provider: work or home office Persons participating in the virtual visit: patient, provider, Ysidro Evert  I discussed the limitations, risks, security and privacy concerns of performing an evaluation and management service by telephone and the availability of in person appointments. I also discussed with the patient that there may be a patient responsible charge related to this service. The patient expressed understanding and agreed to proceed.   Reason for visit: Follow-up  HPI: Choledocholithiasis with acute cholecystitis: Patient notes no additional abdominal pain.  She continues to have the biliary drain in place.  Her grandson notes the clamp came undone.  This was a clamp to hold the drain tube in place.  They discussed with IR today and note that they were advised that it was fine as long as they were using something to hold the tube in place and the tube was flowing properly.  They have been using an adhesive dressing to hold it in place.  Protein calorie malnutrition: Albumin noted to be low in the hospital.  Cardiology felt this was likely contributing to some of her edema.  They have been trying to get the patient to eat better and they have been pushing high-protein and healthier foods.  Her edema has improved significantly.  CHF: Her metoprolol has been decreased to 25 mg twice daily.  She has been  taking Lasix though has not required this over the last 3 days.  Her swelling has improved significantly.  Anemia: Noted to be anemic in the hospital.  She has been taking an over-the-counter iron supplement.  Her anemia was stable over the period of a week while in the hospital.  Needs to have this rechecked.  She was doing physical therapy.  They have discharged her from home health as they noted the risk of having people come into the home was greater than the benefit with the current COVID-19 concerns.  She has been doing home exercises with her family.   ROS: See pertinent positives and negatives per HPI.  Past Medical History:  Diagnosis Date  . A-fib ([place])   . Acid reflux disease   . Arrhythmia   . Atrial fibrillation with RVR ([place]) 08/15/2018  . Chronic diastolic CHF (congestive heart failure) ([place])   . Compression fracture of lumbar vertebra ([place])   . Diverticulitis 01-2008   [facility]  . Diverticulosis   . Esophageal stricture 2006  . Femoral bruit    Right  . Fibrocystic breast disease   . Hiatal hernia   . Hypercholesterolemia    [place] study LDL goal = < 160  . Mitral regurgitation    a. 06/2014 EF 55-60%, elevated end-diastolic pressures, dilated LA at 4.3 cm, mildly dilated RA, severe mitral regurgitation, mild aortic sclerosis without stenosis, mod-severe TR  . Mitral valve prolapse   . Osteoporosis    Dr Matthew Saras  . Pancreatitis 07-2008   Hospitalized   . Schatzki's ring   .  Sepsis ([place]) 08/15/2018  . Skin cancer    facial x 2. [name]    Past Surgical History:  Procedure Laterality Date  . BILIARY STENT PLACEMENT  08/21/2018   Procedure: BILIARY STENT PLACEMENT;  Surgeon: [name], MD;  Location: [facility] ENDOSCOPY;  Service: Gastroenterology;;  . COLONOSCOPY  2006  . Epidural steroids     x1  . ERCP N/A 08/21/2018   Procedure: ENDOSCOPIC RETROGRADE CHOLANGIOPANCREATOGRAPHY (ERCP);  Surgeon: [name], MD;  Location: [place];  Service:  Gastroenterology;  Laterality: N/A;  . ESOPHAGEAL DILATION  2006  . G3 P4 (twins)    . IR CHOLANGIOGRAM EXISTING TUBE  08/18/2018  . IR PERC CHOLECYSTOSTOMY  08/16/2018  . REMOVAL OF STONES  08/21/2018   Procedure: REMOVAL OF STONES;  Surgeon: [name], MD;  Location: [place];  Service: Gastroenterology;;  . SKIN CANCER EXCISION    . SPHINCTEROTOMY  08/21/2018   Procedure: SPHINCTEROTOMY;  Surgeon: [name], MD;  Location: [facility] ENDOSCOPY;  Service: Gastroenterology;;    Family History  Problem Relation Age of Onset  . Breast cancer Maternal Aunt   . Hepatitis Daughter        C  . Lung disease Mother        lung tumor  . Heart attack Daughter 45       S/P stents  . Heart attack Sister 64  . Diabetes Neg Hx   . Stroke Neg Hx     SOCIAL HX: Former smoker   Current Outpatient Medications:  .  amiodarone (PACERONE) 200 MG tablet, Take 1 tablet (200 mg total) by mouth daily., Disp: 90 tablet, Rfl: 3 .  apixaban (ELIQUIS) 2.5 MG TABS tablet, Take 1 tablet (2.5 mg total) by mouth 2 (two) times daily., Disp: 180 tablet, Rfl: 3 .  furosemide (LASIX) 40 MG tablet, Take 1 tablet (40 mg total) by mouth daily., Disp: 90 tablet, Rfl: 3 .  magnesium chloride (SLOW-MAG) 64 MG TBEC SR tablet, Take 2 tablets (128 mg total) by mouth daily., Disp: 60 tablet, Rfl: 3 .  metoprolol tartrate (LOPRESSOR) 25 MG tablet, Take 1 tablet (25 mg total) by mouth 2 (two) times daily., Disp: 180 tablet, Rfl: 3 .  omeprazole (PRILOSEC) 40 MG capsule, Take 1 capsule (40 mg total) by mouth daily., Disp: 90 capsule, Rfl: 3 .  potassium chloride (KLOR-CON 10) 10 MEQ tablet, Take 1 tablet (10 mEq total) by mouth daily. With lasix., Disp: 90 tablet, Rfl: 3 .  rosuvastatin (CRESTOR) 20 MG tablet, Take 1 tablet (20 mg total) by mouth at bedtime., Disp: 90 tablet, Rfl: 3 .  triamcinolone ointment (KENALOG) 0.1 %, Apply 1 application topically 2 (two) times daily as needed (rash). , Disp: , Rfl:   EXAM:  VITALS per  patient if applicable: None.  GENERAL: alert, oriented, appears well and in no acute distress  HEENT: atraumatic, conjunttiva clear, no obvious abnormalities on inspection of external nose and ears  NECK: normal movements of the head and neck  LUNGS: on inspection no signs of respiratory distress, breathing rate appears normal, no obvious gross SOB, gasping or wheezing  CV: no obvious cyanosis  MS: moves all visible extremities without noticeable abnormality  PSYCH/NEURO: pleasant and cooperative, no obvious depression or anxiety, speech and thought processing grossly intact  ASSESSMENT AND PLAN:  Discussed the following assessment and plan:  Chronic diastolic CHF (congestive heart failure) (HCC)  Choledocholithiasis with acute cholecystitis  Anemia, unspecified type  Severe protein-calorie malnutrition (HCC)  Chronic diastolic  CHF (congestive heart failure) (HCC) Edema has improved.  They will continue to monitor.  Choledocholithiasis with acute cholecystitis Asymptomatic.  Biliary drain in place.  They will keep their appointment with IR.  They are concerned about taking the patient in as scheduled next week and note they have discussed with IR and they may push the appointment to May 13.  I discussed that there is going to be risk next week and in May for exposure to COVID-19.  The risk may be less in May though it may not be.  They will have to discuss and determine which appointment they want to keep.  Anemia Plan for recheck of CBC in about 1 month.  Discussed that I would prefer to check this sooner though the family is quite concerned about COVID-19 and to try to limit the patient's risk of exposure we will defer her labs for 1 month.  Severe protein-calorie malnutrition (HCC) Albumin noted to be significantly low.  They have been pushing high-protein and healthy foods.  Discussed rechecking this with lab work in 36 month.  This was likely contributing to her  swelling.    I discussed the assessment and treatment plan with the patient. The patient was provided an opportunity to ask questions and all were answered. The patient agreed with the plan and demonstrated an understanding of the instructions.   The patient was advised to call back or seek an in-person evaluation if the symptoms worsen or if the condition fails to improve as anticipated.   Tommi Rumps, MD

## 2018-09-26 ENCOUNTER — Telehealth: Payer: Self-pay | Admitting: Family Medicine

## 2018-09-26 NOTE — Telephone Encounter (Signed)
Please contact the patient's family and get her set up for lab work in 1 month.  Orders have been placed.  Thanks.

## 2018-09-26 NOTE — Assessment & Plan Note (Signed)
Edema has improved.  They will continue to monitor.

## 2018-09-26 NOTE — Assessment & Plan Note (Signed)
Plan for recheck of CBC in about 1 month.  Discussed that I would prefer to check this sooner though the family is quite concerned about COVID-19 and to try to limit the patient's risk of exposure we will defer her labs for 1 month.

## 2018-09-26 NOTE — Assessment & Plan Note (Signed)
Asymptomatic.  Biliary drain in place.  They will keep their appointment with IR.  They are concerned about taking the patient in as scheduled next week and note they have discussed with IR and they may push the appointment to May 13.  I discussed that there is going to be risk next week and in May for exposure to COVID-19.  The risk may be less in May though it may not be.  They will have to discuss and determine which appointment they want to keep.

## 2018-09-26 NOTE — Assessment & Plan Note (Signed)
Albumin noted to be significantly low.  They have been pushing high-protein and healthy foods.  Discussed rechecking this with lab work in 20 month.  This was likely contributing to her swelling.

## 2018-09-28 NOTE — Telephone Encounter (Signed)
Called pt and was unable to leave a VM due to mailbox being full at this time.

## 2018-09-29 DIAGNOSIS — Z9689 Presence of other specified functional implants: Secondary | ICD-10-CM | POA: Diagnosis not present

## 2018-09-29 DIAGNOSIS — Z9889 Other specified postprocedural states: Secondary | ICD-10-CM | POA: Diagnosis not present

## 2018-09-30 ENCOUNTER — Other Ambulatory Visit: Payer: Self-pay

## 2018-09-30 ENCOUNTER — Other Ambulatory Visit: Payer: Self-pay | Admitting: Gastroenterology

## 2018-09-30 ENCOUNTER — Ambulatory Visit
Admission: RE | Admit: 2018-09-30 | Discharge: 2018-09-30 | Disposition: A | Discharge status: Home / Self Care | Payer: Medicare Other | Source: Ambulatory Visit | Attending: Radiology | Admitting: Radiology

## 2018-09-30 ENCOUNTER — Ambulatory Visit
Admission: RE | Admit: 2018-09-30 | Discharge: 2018-09-30 | Disposition: A | Discharge status: Home / Self Care | Payer: Medicare Other | Source: Ambulatory Visit | Attending: Gastroenterology | Admitting: Gastroenterology

## 2018-09-30 ENCOUNTER — Ambulatory Visit
Admission: RE | Admit: 2018-09-30 | Discharge: 2018-09-30 | Disposition: A | Discharge status: Home / Self Care | Payer: Medicare Other | Source: Ambulatory Visit | Attending: Surgery | Admitting: Surgery

## 2018-09-30 DIAGNOSIS — Z9689 Presence of other specified functional implants: Secondary | ICD-10-CM | POA: Diagnosis not present

## 2018-09-30 DIAGNOSIS — K8043 Calculus of bile duct with acute cholecystitis with obstruction: Secondary | ICD-10-CM

## 2018-09-30 NOTE — Telephone Encounter (Signed)
Called pt and was unable to leave a VM to call back.  

## 2018-09-30 NOTE — Progress Notes (Signed)
Referring Physician(s): Dr. Kae Heller  Chief Complaint: The patient is seen in follow up today s/p cholecystitis   History of present illness: Kylie May is a 85 year old female with past medical history of A fib, GERD, diastolic heart failure, severe mitral valve regurgitation, and pancreatitis who presented to [facility] ED 08/16/18 with RUQ pain.  She was found to have acute cholecystitis with possible choledocholithiasis.  She underwent percutaneous cholecystostomy tube placement 08/16/18 by Dr. Pascal Lux then ERCP with stone removal 08/21/18 by Dr. Therisa Doyne once medically stable. Definitive plan at this time is for surgical removal of her gall bladder.  She presents to IR clinic today for drain evaluation, possible injection.  She is accompanied by her grandson who assists with her care.    Patient denies complaints today. No fever, chills, abdominal pain, nausea, vomiting. She does have scheduled follow-up with surgery next week.  She is maintaining her drain at home by flushing daily and dressing changes as needed.   Past Medical History:  Diagnosis Date  . A-fib ([place])   . Acid reflux disease   . Arrhythmia   . Atrial fibrillation with RVR ([place]) 08/15/2018  . Chronic diastolic CHF (congestive heart failure) ([employer])   . Compression fracture of lumbar vertebra ([place])   . Diverticulitis 01-2008   GI [place]  . Diverticulosis   . Esophageal stricture 2006  . Femoral bruit    Right  . Fibrocystic breast disease   . Hiatal hernia   . Hypercholesterolemia    [place] study LDL goal = < 160  . Mitral regurgitation    a. 06/2014 EF 55-60%, elevated end-diastolic pressures, dilated LA at 4.3 cm, mildly dilated RA, severe mitral regurgitation, mild aortic sclerosis without stenosis, mod-severe TR  . Mitral valve prolapse   . Osteoporosis    Dr Matthew Saras  . Pancreatitis 07-2008   Hospitalized   . Schatzki's ring   . Sepsis ([place]) 08/15/2018  . Skin cancer    facial x 2. [name]    Past  Surgical History:  Procedure Laterality Date  . BILIARY STENT PLACEMENT  08/21/2018   Procedure: BILIARY STENT PLACEMENT;  Surgeon: [name], MD;  Location: [facility] ENDOSCOPY;  Service: Gastroenterology;;  . COLONOSCOPY  2006  . Epidural steroids     x1  . ERCP N/A 08/21/2018   Procedure: ENDOSCOPIC RETROGRADE CHOLANGIOPANCREATOGRAPHY (ERCP);  Surgeon: [name], MD;  Location: [place];  Service: Gastroenterology;  Laterality: N/A;  . ESOPHAGEAL DILATION  2006  . G3 P4 (twins)    . IR CHOLANGIOGRAM EXISTING TUBE  08/18/2018  . IR PERC CHOLECYSTOSTOMY  08/16/2018  . REMOVAL OF STONES  08/21/2018   Procedure: REMOVAL OF STONES;  Surgeon: [name], MD;  Location: [place];  Service: Gastroenterology;;  . SKIN CANCER EXCISION    . SPHINCTEROTOMY  08/21/2018   Procedure: SPHINCTEROTOMY;  Surgeon: [name], MD;  Location: [facility] ENDOSCOPY;  Service: Gastroenterology;;    Allergies: Metronidazole and Penicillin g  Medications: Prior to Admission medications   Medication Sig Start Date End Date Taking? Authorizing Provider  amiodarone (PACERONE) 200 MG tablet Take 1 tablet (200 mg total) by mouth daily. 09/24/18   Minna Merritts, MD  apixaban (ELIQUIS) 2.5 MG TABS tablet Take 1 tablet (2.5 mg total) by mouth 2 (two) times daily. 09/10/18   Minna Merritts, MD  furosemide (LASIX) 40 MG tablet Take 1 tablet (40 mg total) by mouth daily. 09/10/18   Minna Merritts, MD  magnesium chloride (SLOW-MAG)  64 MG TBEC SR tablet Take 2 tablets (128 mg total) by mouth daily. 12/18/17   [name], MD  metoprolol tartrate (LOPRESSOR) 25 MG tablet Take 1 tablet (25 mg total) by mouth 2 (two) times daily. 09/24/18   Minna Merritts, MD  omeprazole (PRILOSEC) 40 MG capsule Take 1 capsule (40 mg total) by mouth daily. 09/10/18   Minna Merritts, MD  potassium chloride (KLOR-CON 10) 10 MEQ tablet Take 1 tablet (10 mEq total) by mouth daily. With lasix. 09/10/18   Minna Merritts, MD  rosuvastatin  (CRESTOR) 20 MG tablet Take 1 tablet (20 mg total) by mouth at bedtime. 09/10/18   Minna Merritts, MD  triamcinolone ointment (KENALOG) 0.1 % Apply 1 application topically 2 (two) times daily as needed (rash).  07/29/18   [provider]     Family History  Problem Relation Age of Onset  . Breast cancer Maternal Aunt   . Hepatitis Daughter        C  . Lung disease Mother        lung tumor  . Heart attack Daughter 4       S/P stents  . Heart attack Sister 35  . Diabetes Neg Hx   . Stroke Neg Hx     Social History   Socioeconomic History  . Marital status: Widowed    Spouse name: Not on file  . Number of children: 3  . Years of education: Not on file  . Highest education level: Not on file  Occupational History  . 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 Financial resource strain: Not hard at all  . Food insecurity:    Worry: Never true    Inability: Never true  . Transportation needs:    Medical: No    Non-medical: No  Tobacco Use  . Smoking status: Former Smoker    Last attempt to quit: 06/04/1951    Years since quitting: 67.3  . Smokeless tobacco: Never Used  . Tobacco comment: Smoked 1945-1953 , up to 2 cigarettes/day  Substance and Sexual Activity  . Alcohol use: Yes    Alcohol/week: 7.0 standard drinks    Types: 7 Glasses of wine per week  . Drug use: No  . Sexual activity: Not Currently  Lifestyle  . Physical activity:    Days per week: 2 days    Minutes per session: 10 min  . Stress: Not at all  Relationships  . Social connections:    Talks on phone: More than three times a week    Gets together: More than three times a week    Attends religious service: More than 4 times per year    Active member of club or organization: Yes    Attends meetings of clubs or organizations: 1 to 4 times per year    Relationship status: Widowed  Other Topics Concern  . Not on file  Social  History Narrative   Has living will    Would want daughter Santiago Glad to be health care POA   Not sure about DNR--definitely doesn't want prolonged life support   Not sure about tube feeds either (but probably not)           Vital Signs: BP (!) 114/49   Pulse (!) 57   Temp 98.7 F (37.1 C)   SpO2 98%   Physical Exam  NAD, alert, HOH Abdomen: soft, flat, non-distended.  Insertion  site intact, clean, and dry.  Bilious output in collection bag.    Imaging: No results found.  Labs:  CBC: Recent Labs    08/19/18 0310 08/22/18 0312 08/23/18 0404 08/24/18 0400  WBC 8.8 8.9 10.2 10.2  HGB 11.3* 10.6* 10.6* 10.7*  HCT 33.1* 32.6* 32.8* 32.9*  PLT 288 301 320 303    COAGS: Recent Labs    07/05/18 1655 07/07/18 0533 08/15/18 2040  INR 1.09  --  1.3*  APTT  --  113*  --     BMP: Recent Labs    08/23/18 0404 08/24/18 0400 08/25/18 0311 08/26/18 0320 08/28/18  NA 134* 137 136 134* 137  K 3.7 3.5 3.4* 3.8  --   CL 104 104 105 105  --   CO2 20* 22 23 23   --   GLUCOSE 104* 91 94 93  --   BUN 19 23 20 15 17   CALCIUM 8.5* 8.7* 8.5* 8.2*  --   CREATININE 1.55* 1.48* 1.27* 1.19* 1.3*  GFRNONAA 28* 30* 36* 39*  --   GFRAA 33* 35* 42* 45*  --     LIVER FUNCTION TESTS: Recent Labs    08/15/18 2040 08/16/18 0938 08/17/18 0324 08/19/18 0310  BILITOT 3.6* 2.6* 1.6* 1.2  AST 180* 106* 68* 22  ALT 153* 117* 84* 46*  ALKPHOS 136* 128* 114 93  PROT 5.6* 5.2* 5.2* 5.2*  ALBUMIN 2.7* 2.5* 2.2* 2.2*    Assessment: Acute cholecystitis s/p percutaneous cholecystostomy tube placement 08/16/18. Patient presents to IR drain clinic today for drain evaluation.  She has been doing well with percutaneous cholecystostomy tube in place.  She has been able to eat and drink with tolerance and is working on improving her intake.  She does have scheduled follow-up with surgery next week for gall bladder removal.   Discussed case with Dr. Vernard Gambles.  Drain to remain in place today. No  issue or concerns based on physical exam today.  No imaging studies needed at this time.  Patient will be scheduled for drain exchange at 3 months post-placement.  This may be canceled if drain/gall bladder removed in the interim.  Continue current management. Grandson given instructions for drain and dressing care.  He verbalizes understanding.  She is encouraged to keep all of her scheduled appointments.   Signed: Docia Barrier, PA 09/30/2018, 10:39 AM   Please refer to Dr. Vernard Gambles attestation of this note for management and plan.

## 2018-10-02 NOTE — Telephone Encounter (Signed)
Called and spoke with pt's care taker pt has been scheduled for labs on May 29 @ 10:30 Am

## 2018-10-05 DIAGNOSIS — K8050 Calculus of bile duct without cholangitis or cholecystitis without obstruction: Secondary | ICD-10-CM | POA: Diagnosis not present

## 2018-10-13 MED FILL — NORMAL SALINE FLUSH SYRINGE: 0.9 | 45 days supply | Qty: 450 | Fill #1

## 2018-10-18 ENCOUNTER — Other Ambulatory Visit: Payer: Self-pay | Admitting: Cardiovascular Disease

## 2018-10-19 ENCOUNTER — Ambulatory Visit (INDEPENDENT_AMBULATORY_CARE_PROVIDER_SITE_OTHER): Payer: Medicare Other | Admitting: Gastroenterology

## 2018-10-19 ENCOUNTER — Other Ambulatory Visit: Payer: Self-pay

## 2018-10-19 DIAGNOSIS — K8043 Calculus of bile duct with acute cholecystitis with obstruction: Secondary | ICD-10-CM | POA: Diagnosis not present

## 2018-10-19 DIAGNOSIS — Z9689 Presence of other specified functional implants: Secondary | ICD-10-CM

## 2018-10-19 DIAGNOSIS — K8309 Other cholangitis: Secondary | ICD-10-CM | POA: Diagnosis not present

## 2018-10-19 DIAGNOSIS — K8000 Calculus of gallbladder with acute cholecystitis without obstruction: Secondary | ICD-10-CM | POA: Diagnosis not present

## 2018-10-19 NOTE — Progress Notes (Signed)
Sherri Sear, MD [address]  [place]  [place], [place] [address]  Main: [phone]  Fax: [phone]    Gastroenterology Consultation Video Visit  Referring Provider:     [name], MD Primary Care Physician:  [name], MD Primary Gastroenterologist:  Dr. Cephas Darby Reason for Consultation:     Follow-up of acute gallstone pancreatitis        HPI:   Kylie May is a 85 y.o. female referred by Dr. Caryl Bis, Angela Adam, MD  for consultation & management of follow-up of acute gallstone pancreatitis  Virtual Visit Video Note  I connected with Kylie May on 10/20/18 at 10:00 AM EDT by video and verified that I am speaking with the correct person using two identifiers.   I discussed the limitations, risks, security and privacy concerns of performing an evaluation and management service by video and the availability of in person appointments. I also discussed with the patient that there may be a patient responsible charge related to this service. The patient expressed understanding and agreed to proceed.  Location of the Patient: Home  Location of the provider: Home office  Persons participating in the visit: Patient, patient's grandson, provider   History of Present Illness: Kylie May was admitted to [facility] in mid March when she presented with acute cholecystitis and choledocholithiasis complicated by severe sepsis and septic shock, transferred to [facility] due to nonavailability of ERCP at [facility] during that weekend, underwent emergent cholecystostomy tube placement by IR and subsequently ERCP with biliary sphincterotomy and stone extraction, pancreatic duct stent placement after resolution of shock.  Patient was also consulted by general surgery at [facility] and [facility].  Patient was seen by the surgeon, Dr. Nadeen Landau at [facility] as outpatient during a tele-visit who did not recommend cholecystectomy due to her age and  recommended removal of cholecystostomy tube.  She was recently seen by IR and confirmed position of the cholecystostomy drain  in right upper quadrant, PD stent in place.  Patient's family made an appointment to see me to discuss about further management of PD stent, discuss about cholecystectomy and removal of the gallbladder drain  According to patient and her grandson, she has been doing clinically well other than not being comfortable due to presence of drain.  Her appetite is improving, good energy levels, denies abdominal pain, nausea or vomiting, yellowing of her eyes, dark urine, clay colored stools, fever, chills, draining 200 mL of bile in 24 hours, without any foul-smelling discharge or blood.  Given her prior episodes of choledocholithiasis, patient's family and her grandson expressed interest in cholecystectomy at this time and requesting to seek second opinion with a Psychologist, sport and exercise.  Her LFTs at the time of discharge were normal.  She does have mild anemia.  She is also found to have small clean-based duodenal ulcers for which she is taking omeprazole 40 mg daily  NSAIDs: None  Antiplts/Anticoagulants/Anti thrombotics: Eliquis for history of A. fib  GI Procedures:  ERCP 08/21/2018 - The upper third of the main bile duct, middle third of the main bile duct and lower third of the main bile duct were moderately dilated. - Choledocholithiasis was found. Complete removal was accomplished by biliary sphincterotomy and balloon extraction. - A biliary sphincterotomy was performed. - The biliary tree was swept. - One plastic stent was placed into the ventral pancreatic duct. - Small clean based duodenum ulcers noted.  Past Medical History:  Diagnosis Date  .  A-fib ([place])   . Acid reflux disease   . Arrhythmia   . Atrial fibrillation with RVR ([place]) 08/15/2018  . Chronic diastolic CHF (congestive heart failure) ([place])   . Compression fracture of lumbar vertebra ([place])   . Diverticulitis 01-2008    GI [place]  . Diverticulosis   . Esophageal stricture 2006  . Femoral bruit    Right  . Fibrocystic breast disease   . Hiatal hernia   . Hypercholesterolemia    [place] study LDL goal = < 160  . Mitral regurgitation    a. 06/2014 EF 55-60%, elevated end-diastolic pressures, dilated LA at 4.3 cm, mildly dilated RA, severe mitral regurgitation, mild aortic sclerosis without stenosis, mod-severe TR  . Mitral valve prolapse   . Osteoporosis    Dr Matthew Saras  . Pancreatitis 07-2008   Hospitalized   . Schatzki's ring   . Sepsis ([place]) 08/15/2018  . Skin cancer    facial x 2. [name]    Past Surgical History:  Procedure Laterality Date  . BILIARY STENT PLACEMENT  08/21/2018   Procedure: BILIARY STENT PLACEMENT;  Surgeon: [name], MD;  Location[facility] ENDOSCOPY;  Service: Gastroenterology;;  . COLONOSCOPY  2006  . Epidural steroids     x1  . ERCP N/A 08/21/2018   Procedure: ENDOSCOPIC RETROGRADE CHOLANGIOPANCREATOGRAPHY (ERCP);  Surgeon: [name], MD;  Location: [place];  Service: Gastroenterology;  Laterality: N/A;  . ESOPHAGEAL DILATION  2006  . G3 P4 (twins)    . IR CHOLANGIOGRAM EXISTING TUBE  08/18/2018  . IR PERC CHOLECYSTOSTOMY  08/16/2018  . REMOVAL OF STONES  08/21/2018   Procedure: REMOVAL OF STONES;  Surgeon: [name], MD;  Location: [place];  Service: Gastroenterology;;  . SKIN CANCER EXCISION    . SPHINCTEROTOMY  08/21/2018   Procedure: SPHINCTEROTOMY;  Surgeon: [name], MD;  Location: [facility] ENDOSCOPY;  Service: Gastroenterology;;    Current Outpatient Medications:  .  amiodarone (PACERONE) 200 MG tablet, Take 1 tablet (200 mg total) by mouth daily., Disp: 90 tablet, Rfl: 3 .  apixaban (ELIQUIS) 2.5 MG TABS tablet, Take 1 tablet (2.5 mg total) by mouth 2 (two) times daily., Disp: 180 tablet, Rfl: 3 .  magnesium chloride (SLOW-MAG) 64 MG TBEC SR tablet, Take 2 tablets (128 mg total) by mouth daily., Disp: 60 tablet, Rfl: 3 .  metoprolol tartrate  (LOPRESSOR) 25 MG tablet, Take 1 tablet (25 mg total) by mouth 2 (two) times daily., Disp: 180 tablet, Rfl: 3 .  omeprazole (PRILOSEC) 40 MG capsule, Take 1 capsule (40 mg total) by mouth daily., Disp: 90 capsule, Rfl: 3 .  rosuvastatin (CRESTOR) 20 MG tablet, Take 1 tablet (20 mg total) by mouth at bedtime., Disp: 90 tablet, Rfl: 3 .  triamcinolone ointment (KENALOG) 0.1 %, Apply 1 application topically 2 (two) times daily as needed (rash). , Disp: , Rfl:  .  carvedilol (COREG) 3.125 MG tablet, , Disp: , Rfl:  .  furosemide (LASIX) 40 MG tablet, Take 1 tablet (40 mg total) by mouth daily. (Patient not taking: Reported on 10/19/2018), Disp: 90 tablet, Rfl: 3 .  potassium chloride (KLOR-CON 10) 10 MEQ tablet, Take 1 tablet (10 mEq total) by mouth daily. With lasix. (Patient not taking: Reported on 10/19/2018), Disp: 90 tablet, Rfl: 3    Family History  Problem Relation Age of Onset  . Breast cancer Maternal Aunt   . Hepatitis Daughter        C  . Lung disease Mother  lung tumor  . Heart attack Daughter 9       S/P stents  . Heart attack Sister 51  . Diabetes Neg Hx   . Stroke Neg Hx      Social History   Tobacco Use  . Smoking status: Former Smoker    Last attempt to quit: 06/04/1951    Years since quitting: 67.4  . Smokeless tobacco: Never Used  . Tobacco comment: Smoked 1945-1953 , up to 2 cigarettes/day  Substance Use Topics  . Alcohol use: Yes    Alcohol/week: 7.0 standard drinks    Types: 7 Glasses of wine per week  . Drug use: No    Allergies as of 10/19/2018 - Review Complete 10/19/2018  Allergen Reaction Noted  . Metronidazole  11/11/2006  . Penicillin g Rash 08/11/2015     Imaging Studies: Reviewed  Assessment and Plan:   BRIETTA MANSO is a 85 y.o. pleasant Caucasian female with significant hearing loss, A. fib on Eliquis with history of acute calculus cholecystitis, recurrent choledocholithiasis, complicated by ascending cholangitis, septic shock,  emergent cholecystostomy drain placement in 08/2018 at [facility], followed by ERCP with biliary sphincterotomy and stone extraction, PD stent placement.  Patient is clinically doing well.  Family requests another abdominal x-ray to check for the presence of PD stent prior to pursuing EGD to remove it.  X-ray KUB ordered Recommend CBC and CMP Will place urgent referral to [facility] general surgery to discuss about cholecystectomy I do not recommend removal of cholecystostomy drain at this time Advised to take multivitamin with calcium and vitamin D daily   Follow Up Instructions:   I discussed the assessment and treatment plan with the patient. The patient was provided an opportunity to ask questions and all were answered. The patient agreed with the plan and demonstrated an understanding of the instructions.   The patient was advised to call back or seek an in-person evaluation if the symptoms worsen or if the condition fails to improve as anticipated.  I provided 25 minutes of face-to-face time during this encounter.   Follow up as needed   Cephas Darby, MD

## 2018-10-30 ENCOUNTER — Other Ambulatory Visit (INDEPENDENT_AMBULATORY_CARE_PROVIDER_SITE_OTHER): Payer: Medicare Other

## 2018-10-30 ENCOUNTER — Other Ambulatory Visit: Payer: Self-pay

## 2018-10-30 DIAGNOSIS — E43 Unspecified severe protein-calorie malnutrition: Secondary | ICD-10-CM

## 2018-10-30 DIAGNOSIS — D649 Anemia, unspecified: Secondary | ICD-10-CM | POA: Diagnosis not present

## 2018-10-30 LAB — COMPREHENSIVE METABOLIC PANEL
ALT: 65 U/L — ABNORMAL HIGH (ref 0–35)
AST: 61 U/L — ABNORMAL HIGH (ref 0–37)
Albumin: 3.8 g/dL (ref 3.5–5.2)
Alkaline Phosphatase: 51 U/L (ref 39–117)
BUN: 19 mg/dL (ref 6–23)
CO2: 29 mEq/L (ref 19–32)
Calcium: 9.4 mg/dL (ref 8.4–10.5)
Chloride: 101 mEq/L (ref 96–112)
Creatinine, Ser: 0.92 mg/dL (ref 0.40–1.20)
GFR: 56.72 mL/min — ABNORMAL LOW (ref >60.00)
Glucose, Bld: 89 mg/dL (ref 70–99)
Potassium: 5 mEq/L (ref 3.5–5.1)
Sodium: 136 mEq/L (ref 135–145)
Total Bilirubin: 0.4 mg/dL (ref 0.2–1.2)
Total Protein: 6.4 g/dL (ref 6.0–8.3)

## 2018-10-30 LAB — CBC
HCT: 36.2 % (ref 36.0–46.0)
Hemoglobin: 12.1 g/dL (ref 12.0–15.0)
MCHC: 33.4 g/dL (ref 30.0–36.0)
MCV: 87.7 fl (ref 78.0–100.0)
Platelets: 251 10*3/uL (ref 150.0–400.0)
RBC: 4.12 Mil/uL (ref 3.87–5.11)
RDW: 15.4 % (ref 11.5–15.5)
WBC: 8.1 10*3/uL (ref 4.0–10.5)

## 2018-10-31 ENCOUNTER — Other Ambulatory Visit: Payer: Self-pay | Admitting: Family Medicine

## 2018-10-31 DIAGNOSIS — R7989 Other specified abnormal findings of blood chemistry: Secondary | ICD-10-CM

## 2018-11-02 ENCOUNTER — Other Ambulatory Visit (HOSPITAL_COMMUNITY): Payer: Self-pay | Admitting: Student

## 2018-11-02 DIAGNOSIS — K8043 Calculus of bile duct with acute cholecystitis with obstruction: Secondary | ICD-10-CM

## 2018-11-03 ENCOUNTER — Other Ambulatory Visit: Payer: Self-pay

## 2018-11-03 ENCOUNTER — Ambulatory Visit (HOSPITAL_COMMUNITY)
Admission: RE | Admit: 2018-11-03 | Discharge: 2018-11-03 | Disposition: A | Discharge status: Home / Self Care | Payer: Medicare Other | Source: Ambulatory Visit | Attending: Student | Admitting: Student

## 2018-11-03 ENCOUNTER — Encounter (HOSPITAL_COMMUNITY): Payer: Self-pay | Admitting: Interventional Radiology

## 2018-11-03 ENCOUNTER — Ambulatory Visit (HOSPITAL_COMMUNITY)
Admission: RE | Admit: 2018-11-03 | Discharge: 2018-11-03 | Disposition: A | Discharge status: Home / Self Care | Payer: Medicare Other | Source: Ambulatory Visit | Attending: Gastroenterology | Admitting: Gastroenterology

## 2018-11-03 DIAGNOSIS — Z4803 Encounter for change or removal of drains: Secondary | ICD-10-CM | POA: Insufficient documentation

## 2018-11-03 DIAGNOSIS — K811 Chronic cholecystitis: Secondary | ICD-10-CM | POA: Diagnosis not present

## 2018-11-03 DIAGNOSIS — K8043 Calculus of bile duct with acute cholecystitis with obstruction: Secondary | ICD-10-CM

## 2018-11-03 DIAGNOSIS — Z466 Encounter for fitting and adjustment of urinary device: Secondary | ICD-10-CM | POA: Diagnosis not present

## 2018-11-03 DIAGNOSIS — Z9689 Presence of other specified functional implants: Secondary | ICD-10-CM

## 2018-11-03 HISTORY — PX: IR EXCHANGE BILIARY DRAIN: IMG6046

## 2018-11-03 MED ORDER — IOHEXOL 300 MG/ML  SOLN
50.0000 mL | Freq: Once | INTRAMUSCULAR | Status: AC | PRN
  Administered 2018-11-03: 10 mL via INTRAVENOUS

## 2018-11-03 MED ORDER — LIDOCAINE HCL 1 % IJ SOLN
INTRAMUSCULAR | Status: DC | PRN
  Administered 2018-11-03: 10 mL

## 2018-11-03 MED ORDER — LIDOCAINE HCL 1 % IJ SOLN
INTRAMUSCULAR | Status: AC
  Filled 2018-11-03: qty 20

## 2018-11-03 NOTE — Procedures (Signed)
Chronic cholecystitis  S/p fluoro chole tube exchg  No comp Stable Full report in pacs

## 2018-11-05 ENCOUNTER — Other Ambulatory Visit: Payer: Medicare Other

## 2018-11-09 ENCOUNTER — Telehealth: Payer: Self-pay | Admitting: Gastroenterology

## 2018-11-09 NOTE — Telephone Encounter (Signed)
Pt Grandson left vm stating he missed a call from [facility] regarding pt  And a procedure that pt needs to have  Please call [phone]

## 2018-11-09 NOTE — Telephone Encounter (Signed)
cb [phone]

## 2018-11-09 NOTE — Telephone Encounter (Signed)
Pt grandson is calling for Kylie May to make sure she received his messages  He would like to discuss pt having a procedure with Kylie May cb [phone]

## 2018-11-10 ENCOUNTER — Other Ambulatory Visit: Payer: Self-pay

## 2018-11-10 DIAGNOSIS — R1310 Dysphagia, unspecified: Secondary | ICD-10-CM

## 2018-11-10 DIAGNOSIS — Z9689 Presence of other specified functional implants: Secondary | ICD-10-CM

## 2018-11-10 DIAGNOSIS — R1319 Other dysphagia: Secondary | ICD-10-CM

## 2018-11-10 NOTE — Telephone Encounter (Signed)
Attempted to return call to pt's grandson, VM was full unable to leave message at this time

## 2018-11-11 ENCOUNTER — Other Ambulatory Visit: Payer: Self-pay

## 2018-11-11 ENCOUNTER — Other Ambulatory Visit (INDEPENDENT_AMBULATORY_CARE_PROVIDER_SITE_OTHER): Payer: Medicare Other

## 2018-11-11 ENCOUNTER — Other Ambulatory Visit: Payer: Medicare Other

## 2018-11-11 DIAGNOSIS — R945 Abnormal results of liver function studies: Secondary | ICD-10-CM

## 2018-11-11 DIAGNOSIS — R7989 Other specified abnormal findings of blood chemistry: Secondary | ICD-10-CM

## 2018-11-11 LAB — HEPATIC FUNCTION PANEL
ALT: 60 U/L — ABNORMAL HIGH (ref 0–35)
AST: 52 U/L — ABNORMAL HIGH (ref 0–37)
Albumin: 3.5 g/dL (ref 3.5–5.2)
Alkaline Phosphatase: 55 U/L (ref 39–117)
Bilirubin, Direct: 0.1 mg/dL (ref 0.0–0.3)
Total Bilirubin: 0.3 mg/dL (ref 0.2–1.2)
Total Protein: 6.4 g/dL (ref 6.0–8.3)

## 2018-11-12 ENCOUNTER — Other Ambulatory Visit
Admission: RE | Admit: 2018-11-12 | Discharge: 2018-11-12 | Disposition: A | Discharge status: Home / Self Care | Payer: Medicare Other | Source: Ambulatory Visit | Attending: Gastroenterology | Admitting: Gastroenterology

## 2018-11-12 DIAGNOSIS — Z1159 Encounter for screening for other viral diseases: Secondary | ICD-10-CM | POA: Insufficient documentation

## 2018-11-13 LAB — NOVEL CORONAVIRUS, NAA (HOSP ORDER, SEND-OUT TO REF LAB; TAT 18-24 HRS): SARS-CoV-2, NAA: NOT DETECTED

## 2018-11-16 ENCOUNTER — Encounter
Admission: RE | Disposition: A | Discharge status: Home / Self Care | Payer: Self-pay | Source: Home / Self Care | Attending: Gastroenterology

## 2018-11-16 ENCOUNTER — Other Ambulatory Visit: Payer: Self-pay

## 2018-11-16 ENCOUNTER — Encounter: Payer: Self-pay | Admitting: *Deleted

## 2018-11-16 ENCOUNTER — Ambulatory Visit: Payer: Medicare Other | Admitting: Anesthesiology

## 2018-11-16 ENCOUNTER — Ambulatory Visit
Admission: RE | Admit: 2018-11-16 | Discharge: 2018-11-16 | Disposition: A | Discharge status: Home / Self Care | Payer: Medicare Other | Attending: Gastroenterology | Admitting: Gastroenterology

## 2018-11-16 DIAGNOSIS — K5710 Diverticulosis of small intestine without perforation or abscess without bleeding: Secondary | ICD-10-CM | POA: Diagnosis not present

## 2018-11-16 DIAGNOSIS — E7800 Pure hypercholesterolemia, unspecified: Secondary | ICD-10-CM | POA: Diagnosis not present

## 2018-11-16 DIAGNOSIS — Z87891 Personal history of nicotine dependence: Secondary | ICD-10-CM | POA: Insufficient documentation

## 2018-11-16 DIAGNOSIS — K219 Gastro-esophageal reflux disease without esophagitis: Secondary | ICD-10-CM | POA: Diagnosis not present

## 2018-11-16 DIAGNOSIS — Z888 Allergy status to other drugs, medicaments and biological substances status: Secondary | ICD-10-CM | POA: Diagnosis not present

## 2018-11-16 DIAGNOSIS — Z880 Allergy status to penicillin: Secondary | ICD-10-CM | POA: Diagnosis not present

## 2018-11-16 DIAGNOSIS — K449 Diaphragmatic hernia without obstruction or gangrene: Secondary | ICD-10-CM | POA: Diagnosis not present

## 2018-11-16 DIAGNOSIS — Z7901 Long term (current) use of anticoagulants: Secondary | ICD-10-CM | POA: Diagnosis not present

## 2018-11-16 DIAGNOSIS — Z79899 Other long term (current) drug therapy: Secondary | ICD-10-CM | POA: Insufficient documentation

## 2018-11-16 DIAGNOSIS — Z9689 Presence of other specified functional implants: Secondary | ICD-10-CM

## 2018-11-16 DIAGNOSIS — I5032 Chronic diastolic (congestive) heart failure: Secondary | ICD-10-CM | POA: Insufficient documentation

## 2018-11-16 DIAGNOSIS — R1319 Other dysphagia: Secondary | ICD-10-CM

## 2018-11-16 DIAGNOSIS — R1310 Dysphagia, unspecified: Secondary | ICD-10-CM

## 2018-11-16 DIAGNOSIS — I4891 Unspecified atrial fibrillation: Secondary | ICD-10-CM | POA: Insufficient documentation

## 2018-11-16 DIAGNOSIS — Z4589 Encounter for adjustment and management of other implanted devices: Secondary | ICD-10-CM | POA: Diagnosis not present

## 2018-11-16 HISTORY — PX: ESOPHAGOGASTRODUODENOSCOPY (EGD) WITH PROPOFOL: SHX5813

## 2018-11-16 SURGERY — ESOPHAGOGASTRODUODENOSCOPY (EGD) WITH PROPOFOL
Anesthesia: General

## 2018-11-16 MED ORDER — PROPOFOL 10 MG/ML IV BOLUS
INTRAVENOUS | Status: DC | PRN
  Administered 2018-11-16: 30 mg via INTRAVENOUS

## 2018-11-16 MED ORDER — PROPOFOL 500 MG/50ML IV EMUL
INTRAVENOUS | Status: DC | PRN
  Administered 2018-11-16: 125 ug/kg/min via INTRAVENOUS

## 2018-11-16 MED ORDER — SODIUM CHLORIDE 0.9 % IV SOLN
INTRAVENOUS | Status: DC
  Administered 2018-11-16: 1000 mL via INTRAVENOUS

## 2018-11-16 MED ORDER — PROPOFOL 10 MG/ML IV BOLUS
INTRAVENOUS | Status: AC
  Filled 2018-11-16: qty 40

## 2018-11-16 MED ORDER — LIDOCAINE HCL (CARDIAC) PF 100 MG/5ML IV SOSY
PREFILLED_SYRINGE | INTRAVENOUS | Status: DC | PRN
  Administered 2018-11-16: 50 mg via INTRAVENOUS

## 2018-11-16 NOTE — Op Note (Signed)
[facility] Gastroenterology Patient Name: Kylie May Procedure Date: 11/16/2018 9:14 AM MRN: [id number] Account #: [id number] Date of Birth: [birth year] Admit Type: Outpatient Age: 85 Room: [facility] ENDO ROOM 4 Gender: Female Note Status: Finalized Procedure:            Upper GI endoscopy Indications:          For removal of pancreatic duct stent Providers:            [name] MD, MD Medicines:            Monitored Anesthesia Care Complications:        No immediate complications. Estimated blood loss: None. Procedure:            Pre-Anesthesia Assessment:                       - Prior to the procedure, a History and Physical was                        performed, and patient medications and allergies were                        reviewed. The patient is competent. The risks and                        benefits of the procedure and the sedation options and                        risks were discussed with the patient. All questions                        were answered and informed consent was obtained.                        Patient identification and proposed procedure were                        verified by the physician, the nurse, the                        anesthesiologist, the anesthetist and the technician in                        the pre-procedure area in the procedure room in the                        endoscopy suite. Mental Status Examination: alert and                        oriented. Airway Examination: normal oropharyngeal                        airway and neck mobility. Respiratory Examination:                        clear to auscultation. CV Examination: normal.                        Prophylactic Antibiotics: The patient does not require  prophylactic antibiotics. Prior Anticoagulants: The                        patient has taken no previous anticoagulant or                        antiplatelet agents. ASA Grade  Assessment: III - A                        patient with severe systemic disease. After reviewing                        the risks and benefits, the patient was deemed in                        satisfactory condition to undergo the procedure. The                        anesthesia plan was to use monitored anesthesia care                        (MAC). Immediately prior to administration of                        medications, the patient was re-assessed for adequacy                        to receive sedatives. The heart rate, respiratory rate,                        oxygen saturations, blood pressure, adequacy of                        pulmonary ventilation, and response to care were                        monitored throughout the procedure. The physical status                        of the patient was re-assessed after the procedure.                       After obtaining informed consent, the endoscope was                        passed under direct vision. Throughout the procedure,                        the patient's blood pressure, pulse, and oxygen                        saturations were monitored continuously. The Endoscope                        was introduced through the mouth, and advanced to the                        second part of duodenum. The upper GI endoscopy was  accomplished without difficulty. The patient tolerated                        the procedure well. Findings:      The ampulla and second portion of the duodenum were normal. PD stent       protruding from ampulla into D2, removed and retreived with a snare      A non-bleeding diverticulum was found in the second portion of the       duodenum.      A 2 cm hiatal hernia was present.      The entire examined stomach was normal.      The cardia and gastric fundus were normal on retroflexion.      The gastroesophageal junction and examined esophagus were normal. Impression:           - Normal ampulla  and second portion of the duodenum.                       - Non-bleeding duodenal diverticulum.                       - 2 cm hiatal hernia.                       - Normal stomach.                       - Normal gastroesophageal junction and esophagus.                       - No specimens collected. Recommendation:       - Discharge patient to home (with escort).                       - Resume previous diet today.                       - Continue present medications. Procedure Code(s):    --- Professional ---                       [phone], Esophagogastroduodenoscopy, flexible, transoral;                        diagnostic, including collection of specimen(s) by                        brushing or washing, when performed (separate procedure) Diagnosis Code(s):    --- Professional ---                       K44.9, Diaphragmatic hernia without obstruction or                        gangrene                       K57.10, Diverticulosis of small intestine without                        perforation or abscess without bleeding CPT copyright 2019 American Medical Association. All rights reserved. The codes documented in this report are preliminary and upon coder review may  be revised to meet current compliance requirements. Dr.  Ronini Julane Crock Raeanne Gathers MD, MD 11/16/2018 9:45:11 AM This report has been signed electronically. Number of Addenda: 0 Note Initiated On: 11/16/2018 9:14 AM Estimated Blood Loss: Estimated blood loss: none.      [facility]

## 2018-11-16 NOTE — Anesthesia Post-op Follow-up Note (Signed)
Anesthesia QCDR form completed.        

## 2018-11-16 NOTE — Transfer of Care (Signed)
Immediate Anesthesia Transfer of Care Note  Patient: Kylie May  Procedure(s) Performed: ESOPHAGOGASTRODUODENOSCOPY (EGD) WITH PROPOFOL (N/A )  Patient Location: PACU  Anesthesia Type:General  Level of Consciousness: awake, alert  and oriented  Airway & Oxygen Therapy: Patient Spontanous Breathing and Patient connected to nasal cannula oxygen  Post-op Assessment: Report given to RN and Post -op Vital signs reviewed and stable  Post vital signs: Reviewed and stable  Last Vitals:  Vitals Value Taken Time  BP    Temp    Pulse    Resp    SpO2      Last Pain:  Vitals:   11/16/18 0910  TempSrc: Tympanic  PainSc: 0-No pain         Complications: No apparent anesthesia complications

## 2018-11-16 NOTE — H&P (Signed)
Kylie Darby, MD [address]  [place]  [place], [place] [address]  Main: [phone]  Fax: [phone] Pager: [phone]  Primary Care Physician:  [name], MD Primary Gastroenterologist:  Dr. Cephas May  Pre-Procedure History & Physical: HPI:  Kylie May is a 85 y.o. female is here for an endoscopy.   Past Medical History:  Diagnosis Date  . A-fib ([place])   . Acid reflux disease   . Arrhythmia   . Atrial fibrillation with RVR ([place]) 08/15/2018  . Chronic diastolic CHF (congestive heart failure) ([place])   . Compression fracture of lumbar vertebra ([place])   . Diverticulitis 01-2008   GI [place]  . Diverticulosis   . Esophageal stricture 2006  . Femoral bruit    Right  . Fibrocystic breast disease   . Hiatal hernia   . Hypercholesterolemia    [place] study LDL goal = < 160  . Mitral regurgitation    a. 06/2014 EF 55-60%, elevated end-diastolic pressures, dilated LA at 4.3 cm, mildly dilated RA, severe mitral regurgitation, mild aortic sclerosis without stenosis, mod-severe TR  . Mitral valve prolapse   . Osteoporosis    Dr Matthew Saras  . Pancreatitis 07-2008   Hospitalized   . Schatzki's ring   . Sepsis ([place]) 08/15/2018  . Skin cancer    facial x 2. [name]    Past Surgical History:  Procedure Laterality Date  . BILIARY STENT PLACEMENT  08/21/2018   Procedure: BILIARY STENT PLACEMENT;  Surgeon: [name], MD;  Location: [facility] ENDOSCOPY;  Service: Gastroenterology;;  . COLONOSCOPY  2006  . Epidural steroids     x1  . ERCP N/A 08/21/2018   Procedure: ENDOSCOPIC RETROGRADE CHOLANGIOPANCREATOGRAPHY (ERCP);  Surgeon: [name], MD;  Location: [place];  Service: Gastroenterology;  Laterality: N/A;  . ESOPHAGEAL DILATION  2006  . G3 P4 (twins)    . IR CHOLANGIOGRAM EXISTING TUBE  08/18/2018  . IR EXCHANGE BILIARY DRAIN  11/03/2018  . IR PERC CHOLECYSTOSTOMY  08/16/2018  . REMOVAL OF STONES  08/21/2018   Procedure: REMOVAL OF STONES;   Surgeon: [name], MD;  Location: [place];  Service: Gastroenterology;;  . SKIN CANCER EXCISION    . SPHINCTEROTOMY  08/21/2018   Procedure: SPHINCTEROTOMY;  Surgeon: [name], MD;  Location: [place];  Service: Gastroenterology;;    Prior to Admission medications   Medication Sig Start Date End Date Taking? Authorizing Provider  apixaban (ELIQUIS) 2.5 MG TABS tablet Take 1 tablet (2.5 mg total) by mouth 2 (two) times daily. 09/10/18  Yes Minna Merritts, MD  metoprolol tartrate (LOPRESSOR) 25 MG tablet Take 1 tablet (25 mg total) by mouth 2 (two) times daily. 09/24/18  Yes Minna Merritts, MD  amiodarone (PACERONE) 200 MG tablet Take 1 tablet (200 mg total) by mouth daily. 09/24/18   Minna Merritts, MD  furosemide (LASIX) 40 MG tablet Take 1 tablet (40 mg total) by mouth daily. Patient not taking: Reported on 10/19/2018 09/10/18   Minna Merritts, MD  magnesium chloride (SLOW-MAG) 64 MG TBEC SR tablet Take 2 tablets (128 mg total) by mouth daily. 12/18/17   [name], MD  omeprazole (PRILOSEC) 40 MG capsule Take 1 capsule (40 mg total) by mouth daily. 09/10/18   Minna Merritts, MD  potassium chloride (KLOR-CON 10) 10 MEQ tablet Take 1 tablet (10 mEq total) by mouth daily. With lasix. Patient not taking: Reported on 10/19/2018 09/10/18   Minna Merritts, MD  rosuvastatin (CRESTOR) 20 MG tablet Take 1 tablet (20 mg total) by mouth at bedtime. 09/10/18   Minna Merritts, MD  triamcinolone ointment (KENALOG) 0.1 % Apply 1 application topically 2 (two) times daily as needed (rash).  07/29/18   [provider]    Allergies as of 11/10/2018 - Review Complete 11/03/2018  Allergen Reaction Noted  . Metronidazole  11/11/2006  . Penicillin g Rash 08/11/2015    Family History  Problem Relation Age of Onset  . Breast cancer Maternal Aunt   . Hepatitis Daughter        C  . Lung disease Mother        lung tumor  . Heart attack Daughter 27       S/P stents  .  Heart attack Sister 20  . Diabetes Neg Hx   . Stroke Neg Hx     Social History   Socioeconomic History  . Marital status: Widowed    Spouse name: Not on file  . Number of children: 3  . Years of education: Not on file  . Highest education level: Not on file  Occupational History  . 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 Financial resource strain: Not hard at all  . Food insecurity    Worry: Never true    Inability: Never true  . Transportation needs    Medical: No    Non-medical: No  Tobacco Use  . Smoking status: Former Smoker    Quit date: 06/04/1951    Years since quitting: 67.4  . Smokeless tobacco: Never Used  . Tobacco comment: Smoked 1945-1953 , up to 2 cigarettes/day  Substance and Sexual Activity  . Alcohol use: Yes    Alcohol/week: 7.0 standard drinks    Types: 7 Glasses of wine per week  . Drug use: No  . Sexual activity: Not Currently  Lifestyle  . Physical activity    Days per week: 2 days    Minutes per session: 10 min  . Stress: Not at all  Relationships  . Social connections    Talks on phone: More than three times a week    Gets together: More than three times a week    Attends religious service: More than 4 times per year    Active member of club or organization: Yes    Attends meetings of clubs or organizations: 1 to 4 times per year    Relationship status: Widowed  . Intimate partner violence    Fear of current or ex partner: No    Emotionally abused: No    Physically abused: No    Forced sexual activity: No  Other Topics Concern  . Not on file  Social History Narrative   Has living will    Would want daughter Santiago Glad to be health care POA   Not sure about DNR--definitely doesn't want prolonged life support   Not sure about tube feeds either (but probably not)          Review of Systems: See HPI, otherwise negative ROS  Physical Exam: BP (!) 171/65   Pulse (!) 57    Temp (!) 97 F (36.1 C) (Tympanic)   Resp 16   Ht 4\' 11"  (1.499 m)   Wt 45.4 kg   SpO2 100%   BMI 20.20 kg/m  General:   Alert,  pleasant and cooperative in NAD Head:  Normocephalic and atraumatic. Neck:  Supple; no masses or  thyromegaly. Lungs:  Clear throughout to auscultation.    Heart:  Regular rate and rhythm. Abdomen:  Soft, nontender and nondistended. Normal bowel sounds, without guarding, and without rebound.   Neurologic:  Alert and  oriented x4;  grossly normal neurologically.  Impression/Plan: Kylie May is here for an endoscopy to be performed for PD stent removal  Risks, benefits, limitations, and alternatives regarding  endoscopy have been reviewed with the patient.  Questions have been answered.  All parties agreeable.   Sherri Sear, MD  11/16/2018, 9:13 AM

## 2018-11-16 NOTE — Anesthesia Preprocedure Evaluation (Signed)
Anesthesia Evaluation  Patient identified by MRN, date of birth, ID band Patient awake    Reviewed: Allergy & Precautions, H&P , NPO status , Patient's Chart, lab work & pertinent test results, reviewed documented beta blocker date and time   Airway Mallampati: II   Neck ROM: full    Dental  (+) Poor Dentition   Pulmonary neg pulmonary ROS, former smoker,    Pulmonary exam normal        Cardiovascular Exercise Tolerance: Poor +CHF  negative cardio ROS Normal cardiovascular exam Rhythm:regular Rate:Normal     Neuro/Psych negative neurological ROS  negative psych ROS   GI/Hepatic negative GI ROS, Neg liver ROS, hiatal hernia, GERD  Medicated,  Endo/Other  negative endocrine ROS  Renal/GU negative Renal ROS  negative genitourinary   Musculoskeletal   Abdominal   Peds  Hematology negative hematology ROS (+) Blood dyscrasia, anemia ,   Anesthesia Other Findings Past Medical History: No date: A-fib [facility]) No date: Acid reflux disease No date: Arrhythmia 08/15/2018: Atrial fibrillation with RVR (HCC) No date: Chronic diastolic CHF (congestive heart failure) (HCC) No date: Compression fracture of lumbar vertebra ([place]) 01-2008: Diverticulitis     Comment:  GI [place] No date: Diverticulosis 2006: Esophageal stricture No date: Femoral bruit     Comment:  Right No date: Fibrocystic breast disease No date: Hiatal hernia No date: Hypercholesterolemia     Comment:  [place] study LDL goal = < 160 No date: Mitral regurgitation     Comment:  a. 06/2014 EF 55-60%, elevated end-diastolic pressures,               dilated LA at 4.3 cm, mildly dilated RA, severe mitral               regurgitation, mild aortic sclerosis without stenosis,               mod-severe TR No date: Mitral valve prolapse No date: Osteoporosis     Comment:  Dr Matthew Saras 07-2008: Pancreatitis     Comment:  Hospitalized  No date: Schatzki's  ring 08/15/2018: Sepsis ([place]) No date: Skin cancer     Comment:  facial x 2. [name] Past Surgical History: 08/21/2018: BILIARY STENT PLACEMENT     Comment:  Procedure: BILIARY STENT PLACEMENT;  Surgeon: [name], MD;  Location: [facility] ENDOSCOPY;  Service:               Gastroenterology;; 2006: COLONOSCOPY No date: Epidural steroids     Comment:  x1 08/21/2018: ERCP; N/A     Comment:  Procedure: ENDOSCOPIC RETROGRADE               CHOLANGIOPANCREATOGRAPHY (ERCP);  Surgeon: [name],               MD;  Location: [place];  Service: Gastroenterology;               Laterality: N/A; 2006: ESOPHAGEAL DILATION No date: G3 P4 (twins) 08/18/2018: IR CHOLANGIOGRAM EXISTING TUBE 11/03/2018: IR EXCHANGE BILIARY DRAIN 08/16/2018: IR PERC CHOLECYSTOSTOMY 08/21/2018: REMOVAL OF STONES     Comment:  Procedure: REMOVAL OF STONES;  Surgeon: [name], MD;              Location: [place];  Service: Gastroenterology;; No date: SKIN CANCER EXCISION 08/21/2018: SPHINCTEROTOMY     Comment:  Procedure: SPHINCTEROTOMY;  Surgeon: [name], MD;  Location: [facility] ENDOSCOPY;  Service: Gastroenterology;;   Reproductive/Obstetrics negative OB ROS                             Anesthesia Physical Anesthesia Plan  ASA: III  Anesthesia Plan: General   Post-op Pain Management:    Induction:   PONV Risk Score and Plan:   Airway Management Planned:   Additional Equipment:   Intra-op Plan:   Post-operative Plan:   Informed Consent: I have reviewed the patients History and Physical, chart, labs and discussed the procedure including the risks, benefits and alternatives for the proposed anesthesia with the patient or authorized representative who has indicated his/her understanding and acceptance.     Dental Advisory Given  Plan Discussed with: CRNA  Anesthesia Plan Comments:         Anesthesia Quick Evaluation

## 2018-11-17 ENCOUNTER — Encounter: Payer: Self-pay | Admitting: Gastroenterology

## 2018-11-18 ENCOUNTER — Ambulatory Visit (INDEPENDENT_AMBULATORY_CARE_PROVIDER_SITE_OTHER): Payer: Medicare Other | Admitting: Family Medicine

## 2018-11-18 ENCOUNTER — Other Ambulatory Visit: Payer: Self-pay

## 2018-11-18 DIAGNOSIS — R7989 Other specified abnormal findings of blood chemistry: Secondary | ICD-10-CM

## 2018-11-18 DIAGNOSIS — I480 Paroxysmal atrial fibrillation: Secondary | ICD-10-CM

## 2018-11-18 DIAGNOSIS — E43 Unspecified severe protein-calorie malnutrition: Secondary | ICD-10-CM | POA: Diagnosis not present

## 2018-11-18 DIAGNOSIS — R945 Abnormal results of liver function studies: Secondary | ICD-10-CM | POA: Diagnosis not present

## 2018-11-18 MED ORDER — METOPROLOL TARTRATE 25 MG PO TABS: 25.0000 mg | ORAL_TABLET | Freq: Two times a day (BID) | ORAL | 3 refills | Status: DC

## 2018-11-18 NOTE — Progress Notes (Signed)
Virtual Visit via video Note  This visit type was conducted due to national recommendations for restrictions regarding the COVID-19 pandemic (e.g. social distancing).  This format is felt to be most appropriate for this patient at this time.  All issues noted in this document were discussed and addressed.  No physical exam was performed (except for noted visual exam findings with Video Visits).   I connected with Kylie May today at 11:00 AM EDT by a video enabled telemedicine application and verified that I am speaking with the correct person using two identifiers. Location patient: home Location provider: work Persons participating in the virtual visit: patient, provider, Yolanda Bonine  I discussed the limitations, risks, security and privacy concerns of performing an evaluation and management service by telephone and the availability of in person appointments. I also discussed with the patient that there may be a patient responsible charge related to this service. The patient expressed understanding and agreed to proceed.  Reason for visit: Follow-up.  HPI: Elevated LFTs: Patient notes no right upper quadrant pain.  She still has a drain in place from her episode of acute cholecystitis though she had her pancreatic duct stent removed earlier this week.  They note the procedure went well.  Her grandson does report that they discussed the drain with Dr. Marius Ditch and notes that she advised him that maybe that was contributing to the elevated LFTs.  They also report that she had referred the patient to [facility] to see a surgeon there though they have not heard anything regarding that.  Severe protein calorie malnutrition: Patients albumin has trended up.  She is eating better than she was previously.  She is eating 3 meals a day.  Eating oatmeal and grits for breakfast.  Noticing much for lunch.  Lots of vegetables for dinner.  1 Ensure daily.  Patient's edema has improved significantly since improving  her protein status.  A. fib: They note no chest pain, shortness of breath, or edema.  No palpitations.  She is been on amiodarone and her blood thinner.   ROS: See pertinent positives and negatives per HPI.  Past Medical History:  Diagnosis Date  . A-fib ([place])   . Acid reflux disease   . Arrhythmia   . Atrial fibrillation with RVR ([place]) 08/15/2018  . Chronic diastolic CHF (congestive heart failure) ([place])   . Compression fracture of lumbar vertebra ([place])   . Diverticulitis 01-2008   GI [place]  . Diverticulosis   . Esophageal stricture 2006  . Femoral bruit    Right  . Fibrocystic breast disease   . Hiatal hernia   . Hypercholesterolemia    [place] study LDL goal = < 160  . Mitral regurgitation    a. 06/2014 EF 55-60%, elevated end-diastolic pressures, dilated LA at 4.3 cm, mildly dilated RA, severe mitral regurgitation, mild aortic sclerosis without stenosis, mod-severe TR  . Mitral valve prolapse   . Osteoporosis    Dr Matthew Saras  . Pancreatitis 07-2008   Hospitalized   . Schatzki's ring   . Sepsis ([place]) 08/15/2018  . Skin cancer    facial x 2. [name]    Past Surgical History:  Procedure Laterality Date  . BILIARY STENT PLACEMENT  08/21/2018   Procedure: BILIARY STENT PLACEMENT;  Surgeon: [name], MD;  [facility];  Service: Gastroenterology;;  . COLONOSCOPY  2006  . Epidural steroids     x1  . ERCP N/A 08/21/2018   Procedure: ENDOSCOPIC RETROGRADE CHOLANGIOPANCREATOGRAPHY (ERCP);  Surgeon: Therisa Doyne,  Megan Salon, MD;  Location: [place];  Service: Gastroenterology;  Laterality: N/A;  . ESOPHAGEAL DILATION  2006  . ESOPHAGOGASTRODUODENOSCOPY (EGD) WITH PROPOFOL N/A 11/16/2018   Procedure: ESOPHAGOGASTRODUODENOSCOPY (EGD) WITH PROPOFOL;  Surgeon: [name], MD;  Location: [place];  Service: Gastroenterology;  Laterality: N/A;  . G3 P4 (twins)    . IR CHOLANGIOGRAM EXISTING TUBE  08/18/2018  . IR EXCHANGE BILIARY DRAIN  11/03/2018  . IR PERC  CHOLECYSTOSTOMY  08/16/2018  . REMOVAL OF STONES  08/21/2018   Procedure: REMOVAL OF STONES;  Surgeon: [name], MD;  Location: [place];  Service: Gastroenterology;;  . SKIN CANCER EXCISION    . SPHINCTEROTOMY  08/21/2018   Procedure: SPHINCTEROTOMY;  Surgeon: [name], MD;  Location: [facility] ENDOSCOPY;  Service: Gastroenterology;;    Family History  Problem Relation Age of Onset  . Breast cancer Maternal Aunt   . Hepatitis Daughter        C  . Lung disease Mother        lung tumor  . Heart attack Daughter 61       S/P stents  . Heart attack Sister 46  . Diabetes Neg Hx   . Stroke Neg Hx     SOCIAL HX: Former smoker.   Current Outpatient Medications:  .  amiodarone (PACERONE) 200 MG tablet, Take 1 tablet (200 mg total) by mouth daily., Disp: 90 tablet, Rfl: 3 .  apixaban (ELIQUIS) 2.5 MG TABS tablet, Take 1 tablet (2.5 mg total) by mouth 2 (two) times daily., Disp: 180 tablet, Rfl: 3 .  magnesium chloride (SLOW-MAG) 64 MG TBEC SR tablet, Take 2 tablets (128 mg total) by mouth daily., Disp: 60 tablet, Rfl: 3 .  metoprolol tartrate (LOPRESSOR) 25 MG tablet, Take 1 tablet (25 mg total) by mouth 2 (two) times daily., Disp: 180 tablet, Rfl: 3 .  omeprazole (PRILOSEC) 40 MG capsule, Take 1 capsule (40 mg total) by mouth daily., Disp: 90 capsule, Rfl: 3 .  rosuvastatin (CRESTOR) 20 MG tablet, Take 1 tablet (20 mg total) by mouth at bedtime., Disp: 90 tablet, Rfl: 3 .  triamcinolone ointment (KENALOG) 0.1 %, Apply 1 application topically 2 (two) times daily as needed (rash). , Disp: , Rfl:   EXAM:  VITALS per patient if applicable: None.  GENERAL: alert, oriented, appears well and in no acute distress  HEENT: atraumatic, conjunttiva clear, no obvious abnormalities on inspection of external nose and ears  NECK: normal movements of the head and neck  LUNGS: on inspection no signs of respiratory distress, breathing rate appears normal, no obvious gross SOB, gasping or wheezing   CV: no obvious cyanosis  MS: moves all visible extremities without noticeable abnormality  PSYCH/NEURO: pleasant and cooperative, no obvious depression or anxiety, speech and thought processing grossly intact  ASSESSMENT AND PLAN:  Discussed the following assessment and plan:  Elevated LFTs This potentially could be related to the drain that she still has in place though could be related to gallstones or some other cause.  I will send a message to her GI physician to get their input and to check on their referral to surgery at [facility].  Plan to recheck in about a month.  Severe protein-calorie malnutrition ([place]) Lab work is improved quite a bit.  Edema has resolved.  Discussed continuing with good nutritional habits.  Paroxysmal atrial fibrillation (HCC) Asymptomatic.  Metoprolol refilled.  She will continue her other medications through cardiology.  [place] office staff will contact the patient to schedule follow-up  for 4 months and lab work for 1 month.   I discussed the assessment and treatment plan with the patient. The patient was provided an opportunity to ask questions and all were answered. The patient agreed with the plan and demonstrated an understanding of the instructions.   The patient was advised to call back or seek an in-person evaluation if the symptoms worsen or if the condition fails to improve as anticipated.    Tommi Rumps, MD

## 2018-11-19 ENCOUNTER — Telehealth: Payer: Self-pay | Admitting: Family Medicine

## 2018-11-19 ENCOUNTER — Encounter: Payer: Self-pay | Admitting: Family Medicine

## 2018-11-19 DIAGNOSIS — R7989 Other specified abnormal findings of blood chemistry: Secondary | ICD-10-CM | POA: Insufficient documentation

## 2018-11-19 NOTE — Assessment & Plan Note (Signed)
Lab work is improved quite a bit.  Edema has resolved.  Discussed continuing with good nutritional habits.

## 2018-11-19 NOTE — Telephone Encounter (Signed)
Please let the patient's family know that I heard back from Dr Marius Ditch and she noted that [facility] had received the surgery referral.  I suspect that the family will hear from [facility] soon.  Thanks.

## 2018-11-19 NOTE — Assessment & Plan Note (Signed)
This potentially could be related to the drain that she still has in place though could be related to gallstones or some other cause.  I will send a message to her GI physician to get their input and to check on their referral to surgery at [facility].  Plan to recheck in about a month.

## 2018-11-19 NOTE — Anesthesia Postprocedure Evaluation (Signed)
Anesthesia Post Note  Patient: Kylie May  Procedure(s) Performed: ESOPHAGOGASTRODUODENOSCOPY (EGD) WITH PROPOFOL (N/A )  Patient location during evaluation: PACU Anesthesia Type: General Level of consciousness: awake and alert Pain management: pain level controlled Vital Signs Assessment: post-procedure vital signs reviewed and stable Respiratory status: spontaneous breathing, nonlabored ventilation, respiratory function stable and patient connected to nasal cannula oxygen Cardiovascular status: blood pressure returned to baseline and stable Postop Assessment: no apparent nausea or vomiting Anesthetic complications: no     Last Vitals:  Vitals:   11/16/18 0957 11/16/18 1006  BP: (!) 116/53 (!) 143/62  Pulse: (!) 58 (!) 59  Resp: 16 18  Temp:    SpO2: 100% 100%    Last Pain:  Vitals:   11/17/18 0743  TempSrc:   PainSc: 0-No pain                 Molli Barrows

## 2018-11-19 NOTE — Assessment & Plan Note (Signed)
Asymptomatic.  Metoprolol refilled.  She will continue her other medications through cardiology.

## 2018-11-20 ENCOUNTER — Telehealth: Payer: Self-pay

## 2018-11-20 NOTE — Telephone Encounter (Signed)
Called and informed pt that her surgery referral for [facility] was placed and they will reach out to her soon to schedule, I spoke with her grandson also and gave this information per pt request.  Nina,cma

## 2018-11-30 ENCOUNTER — Other Ambulatory Visit (HOSPITAL_COMMUNITY): Payer: Self-pay | Admitting: Interventional Radiology

## 2018-11-30 DIAGNOSIS — K8043 Calculus of bile duct with acute cholecystitis with obstruction: Secondary | ICD-10-CM

## 2018-12-01 ENCOUNTER — Other Ambulatory Visit (HOSPITAL_COMMUNITY): Payer: Self-pay | Admitting: Interventional Radiology

## 2018-12-01 ENCOUNTER — Other Ambulatory Visit: Payer: Self-pay

## 2018-12-01 ENCOUNTER — Encounter (HOSPITAL_COMMUNITY): Payer: Self-pay | Admitting: Radiology

## 2018-12-01 ENCOUNTER — Ambulatory Visit (HOSPITAL_COMMUNITY)
Admission: RE | Admit: 2018-12-01 | Discharge: 2018-12-01 | Disposition: A | Discharge status: Home / Self Care | Payer: Medicare Other | Source: Ambulatory Visit | Attending: Interventional Radiology | Admitting: Interventional Radiology

## 2018-12-01 DIAGNOSIS — K8043 Calculus of bile duct with acute cholecystitis with obstruction: Secondary | ICD-10-CM

## 2018-12-01 DIAGNOSIS — T85520A Displacement of bile duct prosthesis, initial encounter: Secondary | ICD-10-CM | POA: Diagnosis not present

## 2018-12-01 DIAGNOSIS — Z4803 Encounter for change or removal of drains: Secondary | ICD-10-CM | POA: Diagnosis not present

## 2018-12-01 HISTORY — PX: IR EXCHANGE BILIARY DRAIN: IMG6046

## 2018-12-01 MED ORDER — LIDOCAINE HCL 1 % IJ SOLN
INTRAMUSCULAR | Status: DC | PRN
  Administered 2018-12-01: 10 mL

## 2018-12-01 MED ORDER — LIDOCAINE HCL 1 % IJ SOLN
INTRAMUSCULAR | Status: AC
  Filled 2018-12-01: qty 20

## 2018-12-01 MED ORDER — IOHEXOL 300 MG/ML  SOLN
50.0000 mL | Freq: Once | INTRAMUSCULAR | Status: AC | PRN
  Administered 2018-12-01: 14:00:00 15 mL

## 2018-12-01 NOTE — Procedures (Signed)
Interventional Radiology Procedure Note  Procedure: Exchange of percutaneous chole tube, with new 26F drain.  The GB is either dumb-bell configuration, or there is defect in the GB wall with infra-cystic collection.  The new tube is in the GB at the fundus. .  Complications: None Recommendations:  - Ok to shower   - Do not submerge - Routine care   Signed,  Dulcy Fanny. Earleen Newport, DO

## 2018-12-07 ENCOUNTER — Encounter: Payer: Self-pay | Admitting: Gastroenterology

## 2018-12-08 MED FILL — NORMAL SALINE FLUSH SYRINGE: 0.9 | 45 days supply | Qty: 450 | Fill #0

## 2018-12-24 ENCOUNTER — Telehealth: Payer: Self-pay | Admitting: Gastroenterology

## 2018-12-24 NOTE — Telephone Encounter (Signed)
Pt grandson Darrol Poke is calling regarding pt he states someone spoke with pt but pt is unsure of the information she was told he would like a call to see what information pt needed please call him on cb [phone]

## 2019-01-05 NOTE — Telephone Encounter (Signed)
Spoke with pt's grandson Kylie May, and he states he called [facility] and ha scheduled appt for 01/19/2019

## 2019-01-06 ENCOUNTER — Telehealth: Payer: Self-pay | Admitting: Family Medicine

## 2019-01-06 DIAGNOSIS — S52509A Unspecified fracture of the lower end of unspecified radius, initial encounter for closed fracture: Secondary | ICD-10-CM | POA: Insufficient documentation

## 2019-01-06 DIAGNOSIS — S52502A Unspecified fracture of the lower end of left radius, initial encounter for closed fracture: Secondary | ICD-10-CM | POA: Diagnosis not present

## 2019-01-06 NOTE — Telephone Encounter (Signed)
Patient's son Hulen Skains called [phone]) stating that the patient fell getting out of the bed and may have fractured her wrist. He does not want to take the patient to the  ED or urgent care due to Covid 19 and the patients age. He wants the patient to have an x-ray done to check for fracture.

## 2019-01-07 NOTE — Telephone Encounter (Signed)
Kylie May patient grandson stated he took patient to [facility] fo rXray patient has fractured radius at the wrist, patient will be follow ed by [facility].

## 2019-01-15 DIAGNOSIS — S52502A Unspecified fracture of the lower end of left radius, initial encounter for closed fracture: Secondary | ICD-10-CM | POA: Diagnosis not present

## 2019-01-19 DIAGNOSIS — K819 Cholecystitis, unspecified: Secondary | ICD-10-CM | POA: Diagnosis not present

## 2019-01-26 ENCOUNTER — Inpatient Hospital Stay (HOSPITAL_COMMUNITY): Admission: RE | Admit: 2019-01-26 | Payer: Medicare Other | Source: Ambulatory Visit

## 2019-01-27 DIAGNOSIS — S52502A Unspecified fracture of the lower end of left radius, initial encounter for closed fracture: Secondary | ICD-10-CM | POA: Diagnosis not present

## 2019-02-01 DIAGNOSIS — K819 Cholecystitis, unspecified: Secondary | ICD-10-CM | POA: Diagnosis not present

## 2019-02-05 DIAGNOSIS — S52502A Unspecified fracture of the lower end of left radius, initial encounter for closed fracture: Secondary | ICD-10-CM | POA: Diagnosis not present

## 2019-02-17 MED FILL — NORMAL SALINE FLUSH SYRINGE: 0.9 | 45 days supply | Qty: 450 | Fill #0

## 2019-02-26 ENCOUNTER — Ambulatory Visit (INDEPENDENT_AMBULATORY_CARE_PROVIDER_SITE_OTHER): Payer: Medicare Other

## 2019-02-26 ENCOUNTER — Other Ambulatory Visit: Payer: Self-pay

## 2019-02-26 DIAGNOSIS — Z23 Encounter for immunization: Secondary | ICD-10-CM

## 2019-03-05 DIAGNOSIS — S52502A Unspecified fracture of the lower end of left radius, initial encounter for closed fracture: Secondary | ICD-10-CM | POA: Diagnosis not present

## 2019-04-02 MED FILL — NORMAL SALINE FLUSH SYRINGE: 0.9 | 45 days supply | Qty: 450 | Fill #1

## 2019-04-07 DIAGNOSIS — S73192A Other sprain of left hip, initial encounter: Secondary | ICD-10-CM | POA: Diagnosis not present

## 2019-04-07 DIAGNOSIS — M25532 Pain in left wrist: Secondary | ICD-10-CM | POA: Diagnosis not present

## 2019-04-07 DIAGNOSIS — S8002XA Contusion of left knee, initial encounter: Secondary | ICD-10-CM | POA: Diagnosis not present

## 2019-04-07 DIAGNOSIS — S63639A Sprain of interphalangeal joint of unspecified finger, initial encounter: Secondary | ICD-10-CM | POA: Insufficient documentation

## 2019-04-07 DIAGNOSIS — S63636A Sprain of interphalangeal joint of right little finger, initial encounter: Secondary | ICD-10-CM | POA: Diagnosis not present

## 2019-04-22 DIAGNOSIS — S8002XA Contusion of left knee, initial encounter: Secondary | ICD-10-CM | POA: Diagnosis not present

## 2019-04-22 DIAGNOSIS — S52502A Unspecified fracture of the lower end of left radius, initial encounter for closed fracture: Secondary | ICD-10-CM | POA: Diagnosis not present

## 2019-04-22 DIAGNOSIS — S73192A Other sprain of left hip, initial encounter: Secondary | ICD-10-CM | POA: Diagnosis not present

## 2019-05-10 MED FILL — NORMAL SALINE FLUSH SYRINGE: 0.9 | 45 days supply | Qty: 450 | Fill #2

## 2019-05-13 ENCOUNTER — Other Ambulatory Visit (HOSPITAL_COMMUNITY): Payer: Self-pay | Admitting: Diagnostic Radiology

## 2019-05-13 ENCOUNTER — Ambulatory Visit (HOSPITAL_COMMUNITY)
Admission: RE | Admit: 2019-05-13 | Discharge: 2019-05-13 | Disposition: A | Discharge status: Home / Self Care | Payer: Medicare Other | Source: Ambulatory Visit | Attending: Interventional Radiology | Admitting: Interventional Radiology

## 2019-05-13 ENCOUNTER — Other Ambulatory Visit: Payer: Self-pay

## 2019-05-13 DIAGNOSIS — Z434 Encounter for attention to other artificial openings of digestive tract: Secondary | ICD-10-CM

## 2019-05-13 DIAGNOSIS — K8043 Calculus of bile duct with acute cholecystitis with obstruction: Secondary | ICD-10-CM | POA: Diagnosis not present

## 2019-05-13 HISTORY — PX: IR EXCHANGE BILIARY DRAIN: IMG6046

## 2019-05-13 MED ORDER — LIDOCAINE HCL 1 % IJ SOLN
INTRAMUSCULAR | Status: AC
  Filled 2019-05-13: qty 20

## 2019-05-13 MED ORDER — LIDOCAINE HCL (PF) 1 % IJ SOLN
INTRAMUSCULAR | Status: DC | PRN
  Administered 2019-05-13: 5 mL

## 2019-05-13 MED ORDER — IOHEXOL 300 MG/ML  SOLN
50.0000 mL | Freq: Once | INTRAMUSCULAR | Status: AC | PRN
  Administered 2019-05-13: 10 mL

## 2019-05-13 NOTE — Procedures (Signed)
Interventional Radiology Procedure:   Indications: Cholecystostomy tube  Procedure: Tube exchange  Findings: Tube was partially retracted.  New drain in GB.  Cystic duct and CBD are patent.  Complications: None     EBL: less than 10 ml  Plan: Discussed capping trial and removal with grandson.  Family wants to think about it.  She is not a surgical candidate.  Plan for routine exchange in few months.    Neziah Braley R. Anselm Pancoast, MD  Pager: [phone]

## 2019-06-14 ENCOUNTER — Other Ambulatory Visit: Payer: Self-pay

## 2019-06-14 ENCOUNTER — Other Ambulatory Visit (HOSPITAL_COMMUNITY): Payer: Self-pay | Admitting: Interventional Radiology

## 2019-06-14 ENCOUNTER — Ambulatory Visit (HOSPITAL_COMMUNITY)
Admission: RE | Admit: 2019-06-14 | Discharge: 2019-06-14 | Disposition: A | Discharge status: Home / Self Care | Payer: Medicare Other | Source: Ambulatory Visit | Attending: Diagnostic Radiology | Admitting: Diagnostic Radiology

## 2019-06-14 ENCOUNTER — Other Ambulatory Visit (HOSPITAL_COMMUNITY): Payer: Self-pay | Admitting: Diagnostic Radiology

## 2019-06-14 DIAGNOSIS — Z434 Encounter for attention to other artificial openings of digestive tract: Secondary | ICD-10-CM | POA: Insufficient documentation

## 2019-06-14 DIAGNOSIS — K8051 Calculus of bile duct without cholangitis or cholecystitis with obstruction: Secondary | ICD-10-CM | POA: Diagnosis not present

## 2019-06-14 HISTORY — PX: IR EXCHANGE BILIARY DRAIN: IMG6046

## 2019-06-14 MED ORDER — IOHEXOL 300 MG/ML  SOLN
50.0000 mL | Freq: Once | INTRAMUSCULAR | Status: AC | PRN
  Administered 2019-06-14: 10 mL

## 2019-06-14 MED ORDER — LIDOCAINE HCL 1 % IJ SOLN
INTRAMUSCULAR | Status: AC
  Filled 2019-06-14: qty 20

## 2019-06-22 ENCOUNTER — Other Ambulatory Visit: Payer: Self-pay

## 2019-06-22 ENCOUNTER — Ambulatory Visit (INDEPENDENT_AMBULATORY_CARE_PROVIDER_SITE_OTHER): Payer: Medicare Other

## 2019-06-22 VITALS — Ht 59.0 in | Wt 100.0 lb

## 2019-06-22 DIAGNOSIS — Z0000 Encounter for general adult medical examination without abnormal findings: Secondary | ICD-10-CM

## 2019-06-22 NOTE — Patient Instructions (Addendum)
  Ms. Hinebaugh , Thank you for taking time to come for your Medicare Wellness Visit. I appreciate your ongoing commitment to your health goals. Please review the following plan we discussed and let me know if I can assist you in the future.   These are the goals we discussed: Goals    . DIET - INCREASE WATER INTAKE     Stay hydrated       This is a list of the screening recommended for you and due dates:  Health Maintenance  Topic Date Due  . Tetanus Vaccine  04/18/2026  . Flu Shot  Completed  . DEXA scan (bone density measurement)  Completed  . Pneumonia vaccines  Completed

## 2019-06-22 NOTE — Progress Notes (Signed)
Subjective:   Kylie May is a 86 y.o. female who presents for Medicare Annual (Subsequent) preventive examination.  Review of Systems:  No ROS.  Medicare Wellness Virtual Visit.  Visual/audio telehealth visit, UTA vital signs.   Ht/Wt provided.  See social history for additional risk factors.   Cardiac Risk Factors include: advanced age (>30men, >64 women)     Objective:     Vitals: Ht 4\' 11"  (1.499 m)   Wt 100 lb (45.4 kg)   BMI 20.20 kg/m   Body mass index is 20.2 kg/m.  Advanced Directives 06/22/2019 11/16/2018 08/18/2018 08/15/2018 08/14/2018 07/05/2018 07/05/2018  Does Patient Have a Medical Advance Directive? Yes Yes Yes Yes No Yes No  Type of Advance Directive Healthcare Power of [place] -  Does patient want to make changes to medical advance directive? No - Patient declined - No - Patient declined No - Patient declined - No - Patient declined -  Copy of [place] in Chart? No - copy requested - No - copy requested No - copy requested - No - copy requested -  Would patient like information on creating a medical advance directive? - - No - Patient declined No - Patient declined No - Patient declined - No - Patient declined    Tobacco Social History   Tobacco Use  Smoking Status Former Smoker  . Quit date: 06/04/1951  . Years since quitting: 68.0  Smokeless Tobacco Never Used  Tobacco Comment   Smoked 1945-1953 , up to 2 cigarettes/day     Counseling given: Not Answered Comment: Smoked 1945-1953 , up to 2 cigarettes/day   Clinical Intake:  Pre-visit preparation completed: Yes        Diabetes: No  How often do you need to have someone help you when you read instructions, pamphlets, or other written materials from your doctor or pharmacy?: 1 - Never  Interpreter Needed?: No     Past Medical History:  Diagnosis Date  . A-fib ([place])   . Acid  reflux disease   . Arrhythmia   . Atrial fibrillation with RVR ([place]) 08/15/2018  . Chronic diastolic CHF (congestive heart failure) ([place])   . Compression fracture of lumbar vertebra ([place])   . Diverticulitis 01-2008   [facility] [place]  . Diverticulosis   . Esophageal stricture 2006  . Femoral bruit    Right  . Fibrocystic breast disease   . Hiatal hernia   . Hypercholesterolemia    [place] study LDL goal = < 160  . Mitral regurgitation    a. 06/2014 EF 55-60%, elevated end-diastolic pressures, dilated LA at 4.3 cm, mildly dilated RA, severe mitral regurgitation, mild aortic sclerosis without stenosis, mod-severe TR  . Mitral valve prolapse   . Osteoporosis    Dr Matthew Saras  . Pancreatitis 07-2008   Hospitalized   . Schatzki's ring   . Sepsis ([place]) 08/15/2018  . Skin cancer    facial x 2. [name]   Past Surgical History:  Procedure Laterality Date  . BILIARY STENT PLACEMENT  08/21/2018   Procedure: BILIARY STENT PLACEMENT;  Surgeon: [name], MD;  Location: [facility] ENDOSCOPY;  Service: Gastroenterology;;  . COLONOSCOPY  2006  . Epidural steroids     x1  . ERCP N/A 08/21/2018   Procedure: ENDOSCOPIC RETROGRADE CHOLANGIOPANCREATOGRAPHY (ERCP);  Surgeon: [name], MD;  Location: [place];  Service: Gastroenterology;  Laterality: N/A;  . ESOPHAGEAL DILATION  2006  . ESOPHAGOGASTRODUODENOSCOPY (EGD) WITH PROPOFOL N/A 11/16/2018   Procedure: ESOPHAGOGASTRODUODENOSCOPY (EGD) WITH PROPOFOL;  Surgeon: [name], MD;  Location: [facility] ENDOSCOPY;  Service: Gastroenterology;  Laterality: N/A;  . G3 P4 (twins)    . IR CHOLANGIOGRAM EXISTING TUBE  08/18/2018  . IR EXCHANGE BILIARY DRAIN  11/03/2018  . IR EXCHANGE BILIARY DRAIN  12/01/2018  . IR EXCHANGE BILIARY DRAIN  05/13/2019  . IR EXCHANGE BILIARY DRAIN  06/14/2019  . IR PERC CHOLECYSTOSTOMY  08/16/2018  . REMOVAL OF STONES  08/21/2018   Procedure: REMOVAL OF STONES;  Surgeon: [name], MD;  Location: [place];  Service:  Gastroenterology;;  . SKIN CANCER EXCISION    . SPHINCTEROTOMY  08/21/2018   Procedure: SPHINCTEROTOMY;  Surgeon: [name], MD;  Location: [facility] ENDOSCOPY;  Service: Gastroenterology;;   Family History  Problem Relation Age of Onset  . Breast cancer Maternal Aunt   . Hepatitis Daughter        C  . Lung disease Mother        lung tumor  . Heart attack Daughter 34       S/P stents  . Liver cancer Daughter   . Heart attack Sister 33  . Diabetes Neg Hx   . Stroke Neg Hx    Social History   Socioeconomic History  . Marital status: Widowed    Spouse name: Not on file  . Number of children: 3  . Years of education: Not on file  . Highest education level: Not on file  Occupational History  . 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 Smoking status: Former Smoker    Quit date: 06/04/1951    Years since quitting: 68.0  . Smokeless tobacco: Never Used  . Tobacco comment: Smoked 1945-1953 , up to 2 cigarettes/day  Substance and Sexual Activity  . Alcohol use: Yes    Alcohol/week: 1.0 standard drinks    Types: 1 Glasses of wine per week  . Drug use: No  . Sexual activity: Not Currently  Other Topics Concern  . Not on file  Social History Narrative   Has living will    Would want daughter Santiago Glad to be health care POA   Not sure about DNR--definitely doesn't want prolonged life support   Not sure about tube feeds either (but probably not)         Social Determinants of Health   Financial Resource Strain: Low Risk   . Difficulty of Paying Living Expenses: Not hard at all  Food Insecurity: No Food Insecurity  . Worried About Charity fundraiser in the Last Year: Never true  . Ran Out of Food in the Last Year: Never true  Transportation Needs: No Transportation Needs  . Lack of Transportation (Medical): No  . Lack of Transportation (Non-Medical): No  Physical Activity:   . Days of Exercise per Week: Not on file   . Minutes of Exercise per Session: Not on file  Stress: No Stress Concern Present  . Feeling of Stress : Not at all  Social Connections: Slightly Isolated  . Frequency of Communication with Friends and Family: More than three times a week  . Frequency of Social Gatherings with Friends and Family: More than three times a week  . Attends Religious Services: More than 4 times per year  . Active Member of Clubs or Organizations: Yes  . Attends Archivist Meetings: 1 to 4 times  per year  . Marital Status: Widowed    Outpatient Encounter Medications as of 06/22/2019  Medication Sig  . amiodarone (PACERONE) 200 MG tablet Take 1 tablet (200 mg total) by mouth daily.  [name] apixaban (ELIQUIS) 2.5 MG TABS tablet Take 1 tablet (2.5 mg total) by mouth 2 (two) times daily.  . magnesium chloride (SLOW-MAG) 64 MG TBEC SR tablet Take 2 tablets (128 mg total) by mouth daily.  . metoprolol tartrate (LOPRESSOR) 25 MG tablet Take 1 tablet (25 mg total) by mouth 2 (two) times daily.  [name] omeprazole (PRILOSEC) 40 MG capsule Take 1 capsule (40 mg total) by mouth daily.  . rosuvastatin (CRESTOR) 20 MG tablet Take 1 tablet (20 mg total) by mouth at bedtime.  . triamcinolone ointment (KENALOG) 0.1 % Apply 1 application topically 2 (two) times daily as needed (rash).    No facility-administered encounter medications on file as of 06/22/2019.    Activities of Daily Living In your present state of health, do you have any difficulty performing the following activities: 06/22/2019 08/18/2018  Hearing? Y Y  Comment Hearing loss -  Vision? N N  Difficulty concentrating or making decisions? N N  Walking or climbing stairs? Y N  Comment Unsteady gait -  Dressing or bathing? N N  Doing errands, shopping? Y N  Comment She does not currently drive Facilities manager and eating ? Y -  Comment Family assist with meal prep -  Using the Toilet? N -  In the past six months, have you accidently leaked urine? N -  Do you  have problems with loss of bowel control? N -  Managing your Medications? Y -  Comment Grandson assist -  Managing your Finances? Y -  Comment Grandson assist -  Housekeeping or managing your Housekeeping? Y -  Comment Grandson assist -  Some recent data might be hidden    Patient Care Team: [name], MD as PCP - General (Family Medicine) Rockey Situ Kathlene November, MD as PCP - Cardiology (Cardiology) Minna Merritts, MD as Consulting Physician (Cardiology)    Assessment:   This is a routine wellness examination for [place].  Nurse connected with patient 06/22/19 at  1:30 PM EST by a telephone enabled telemedicine application and verified that I am speaking with the correct person using two identifiers. Patient stated full name and DOB. Patient gave permission to continue with virtual visit. Patient's grandson Hulen Skains) assists with relaying information due to patients hearing loss. Patient's location was at home and Nurse's location was at [place] office.   Patient is alert and oriented x3. Patient denies difficulty focusing or concentrating. Patient reads for brain stimulation.   Health Maintenance Due: See completed HM at the end of note.   Eye: Visual acuity not assessed. Virtual visit. Followed by their ophthalmologist.  Dental: UTD.  Hearing: Hearing aids- yes.  Safety:  Patient feels safe at home- yes. Grandson currently lives with patient.  Patient does have smoke detectors at home- yes Patient does wear sunscreen or protective clothing when in direct sunlight - yes Patient does wear seat belt when in a moving vehicle - yes Patient drives- not currently Adequate lighting in walkways free from debris- yes Grab bars and handrails used as appropriate- yes Ambulates with an assistive device- yes; walker/cane as needed.  Social: Alcohol intake - yes, one drink per week.   Smoking history- former   Smokers in home? none Illicit drug use? none  Medication:  Taking as directed  and without issues.  Pill box in use -yes Medication managed by grandson.   Covid-19: Precautions and sickness symptoms discussed. Wears mask, social distancing, hand hygiene as appropriate.   Activities of Daily Living Patient denies needing assistance with: feeding themselves, getting from bed to chair, getting to the toilet, bathing/showering, dressing, managing money.  Assisted by grandson with household chores, meal prep, and managing money as needed.   Discussed the importance of a healthy diet, water intake and the benefits of aerobic exercise.   Physical activity- active around the home, no routine.  Diet:  Regular Water: fair intake  Other Providers Patient Care Team: [name], MD as PCP - General (Family Medicine) Rockey Situ, Kathlene November, MD as PCP - Cardiology (Cardiology) Minna Merritts, MD as Consulting Physician (Cardiology)  Exercise Activities and Dietary recommendations Current Exercise Habits: Home exercise routine, Type of exercise: walking, Intensity: Mild  Goals    . DIET - INCREASE WATER INTAKE     Stay hydrated       Fall Risk Fall Risk  06/22/2019 05/30/2017 10/02/2016 05/30/2016 02/17/2014  Falls in the past year? 0 No No Yes Yes  Comment None since last reported 01/2019 - - - -  Number falls in past yr: - - - 2 or more 1  Injury with Fall? - - - Yes Yes  Risk Factor Category  - - - High Fall Risk High Fall Risk  Risk for fall due to : - - - History of fall(s) History of fall(s)  Follow up Falls evaluation completed - - Education provided;Falls prevention discussed -   Timed Get Up and Go performed: no, virtual visit.  Depression Screen PHQ 2/9 Scores 06/22/2019 06/11/2018 05/30/2017 10/02/2016  PHQ - 2 Score 0 0 0 0     Cognitive Function MMSE - Mini Mental State Exam 06/22/2019 05/30/2016  Not completed: Unable to complete -  Orientation to time - 5  Orientation to Place - 5  Registration - 3  Attention/ Calculation -  5  Recall - 3  Language- name 2 objects - 2  Language- repeat - 1  Language- follow 3 step command - 3  Language- read & follow direction - 1  Write a sentence - 1  Copy design - 1  Total score - 30     6CIT Screen 06/11/2018 05/30/2017  What Year? 0 points 0 points  What month? 0 points 0 points  What time? 0 points 0 points  Count back from 20 0 points 0 points  Months in reverse 0 points 0 points  Repeat phrase 0 points 0 points  Total Score 0 0    Immunization History  Administered Date(s) Administered  . Fluad Quad(high Dose 65+) 02/26/2019  . H1N1 08/08/2008  . Influenza, High Dose Seasonal PF 02/24/2018  . Influenza,inj,Quad PF,6+ Mos 02/17/2014  . Influenza-Unspecified 03/03/2016, 02/24/2017  . Pneumococcal Conjugate-13 02/17/2014  . Pneumococcal Polysaccharide-23 11/10/2012  . Td 01/09/2010  . Tdap 04/18/2016  . Zoster Recombinat (Shingrix) 11/25/2016, 04/12/2017   Screening Tests Health Maintenance  Topic Date Due  . TETANUS/TDAP  04/18/2026  . INFLUENZA VACCINE  Completed  . DEXA SCAN  Completed  . PNA vac Low Risk Adult  Completed      Plan:   Keep all routine maintenance appointments.    Patient's grandson reports what appears to be a boil on outer L ankle. Tender to touch and off color with a yellow tint. Denies weeping, streaks or redness. Offered follow up  with pcp, declined. Notes will call dermatology today.  I encouraged patient to call and schedule with provider as needed and/or if symptoms persist.   Schedule follow up with your doctor as needed.   Medicare Attestation I have personally reviewed: The patient's medical and social history Their use of alcohol, tobacco or illicit drugs Their current medications and supplements The patient's functional ability including ADLs,fall risks, home safety risks, cognitive, and hearing and visual impairment Diet and physical activities Evidence for depression   I have reviewed and discussed with  patient certain preventive protocols, quality metrics, and best practice recommendations.     Varney Biles, LPN  [id number]

## 2019-06-24 DIAGNOSIS — D485 Neoplasm of uncertain behavior of skin: Secondary | ICD-10-CM | POA: Diagnosis not present

## 2019-06-24 DIAGNOSIS — L570 Actinic keratosis: Secondary | ICD-10-CM | POA: Diagnosis not present

## 2019-06-25 ENCOUNTER — Telehealth (INDEPENDENT_AMBULATORY_CARE_PROVIDER_SITE_OTHER): Payer: Medicare Other | Admitting: Family

## 2019-06-25 ENCOUNTER — Other Ambulatory Visit: Payer: Self-pay

## 2019-06-25 ENCOUNTER — Encounter: Payer: Self-pay | Admitting: Family

## 2019-06-25 VITALS — Ht 60.0 in | Wt 102.0 lb

## 2019-06-25 DIAGNOSIS — I5032 Chronic diastolic (congestive) heart failure: Secondary | ICD-10-CM

## 2019-06-25 DIAGNOSIS — Z79899 Other long term (current) drug therapy: Secondary | ICD-10-CM | POA: Diagnosis not present

## 2019-06-25 DIAGNOSIS — Z7901 Long term (current) use of anticoagulants: Secondary | ICD-10-CM

## 2019-06-25 DIAGNOSIS — I480 Paroxysmal atrial fibrillation: Secondary | ICD-10-CM | POA: Diagnosis not present

## 2019-06-25 MED ORDER — APIXABAN 2.5 MG PO TABS: 2.5000 mg | ORAL_TABLET | Freq: Two times a day (BID) | ORAL | 1 refills | Status: DC

## 2019-06-25 MED ORDER — METOPROLOL TARTRATE 25 MG PO TABS: 25.0000 mg | ORAL_TABLET | Freq: Two times a day (BID) | ORAL | 1 refills | Status: DC

## 2019-06-25 MED ORDER — AMIODARONE HCL 200 MG PO TABS: 200.0000 mg | ORAL_TABLET | Freq: Every day | ORAL | 1 refills | Status: DC

## 2019-06-25 MED ORDER — ROSUVASTATIN CALCIUM 20 MG PO TABS: 20.0000 mg | ORAL_TABLET | Freq: Every day | ORAL | 1 refills | Status: DC

## 2019-06-25 MED ORDER — OMEPRAZOLE 40 MG PO CPDR: 40.0000 mg | DELAYED_RELEASE_CAPSULE | Freq: Every day | ORAL | 1 refills | Status: DC

## 2019-06-25 NOTE — Progress Notes (Signed)
Virtual Visit via Video Note   This visit type was conducted due to national recommendations for restrictions regarding the COVID-19 Pandemic (e.g. social distancing) in an effort to limit this patient's exposure and mitigate transmission in our community.  Due to her co-morbid illnesses, this patient is at least at moderate risk for complications without adequate follow up.  This format is felt to be most appropriate for this patient at this time.  All issues noted in this document were discussed and addressed.  A limited physical exam was performed with this format.  Please refer to the patient's chart for her consent to telehealth for [facility].   Date:  06/25/2019   ID:  [name], DOB 09/11/1921, MRN [id number]  Patient Location: Home Provider Location: Office  PCP:  [name], MD  Cardiologist:  Ida Rogue, MD  Electrophysiologist:  None   Evaluation Performed:  Follow-Up Visit  Chief Complaint:  PAF  History of Present Illness:    Kylie May is a 86 y.o. female with PAF, HTN, hiatal hernia, osteopenia, degenerative join disease, scoliosis, hx of multiple falls, hard of hearing. Presents today for follow follow up of her PAF.  Video visit assisted by family member who asks as caretaker. No concerns today. Asks if COVID vaccine would be safe, told them I would recommend the COVID vaccine.  No chest pain, pressure, tightness.  No shortness of breath no DOE.  Denies rapid heart rates.  Checks blood pressure and heart rate at home routinely with assistance of family and tells me they are "good ".   The patient does not have symptoms concerning for COVID-19 infection (fever, chills, cough, or new shortness of breath).    Past Medical History:  Diagnosis Date  . A-fib ([place])   . Acid reflux disease   . Arrhythmia   . Atrial fibrillation with RVR ([place]) 08/15/2018  . Chronic diastolic CHF (congestive heart failure) ([place])   . Compression fracture  of lumbar vertebra ([place])   . Diverticulitis 01-2008   GI [place]  . Diverticulosis   . Esophageal stricture 2006  . Femoral bruit    Right  . Fibrocystic breast disease   . Hiatal hernia   . Hypercholesterolemia    [place] study LDL goal = < 160  . Mitral regurgitation    a. 06/2014 EF 55-60%, elevated end-diastolic pressures, dilated LA at 4.3 cm, mildly dilated RA, severe mitral regurgitation, mild aortic sclerosis without stenosis, mod-severe TR  . Mitral valve prolapse   . Osteoporosis    Dr Matthew Saras  . Pancreatitis 07-2008   Hospitalized   . Schatzki's ring   . Sepsis ([place]) 08/15/2018  . Skin cancer    facial x 2. [name]   Past Surgical History:  Procedure Laterality Date  . BILIARY STENT PLACEMENT  08/21/2018   Procedure: BILIARY STENT PLACEMENT;  Surgeon: [name], MD;  Location: [facility] ENDOSCOPY;  Service: Gastroenterology;;  . COLONOSCOPY  2006  . Epidural steroids     x1  . ERCP N/A 08/21/2018   Procedure: ENDOSCOPIC RETROGRADE CHOLANGIOPANCREATOGRAPHY (ERCP);  Surgeon: [name], MD;  Location: [place];  Service: Gastroenterology;  Laterality: N/A;  . ESOPHAGEAL DILATION  2006  . ESOPHAGOGASTRODUODENOSCOPY (EGD) WITH PROPOFOL N/A 11/16/2018   Procedure: ESOPHAGOGASTRODUODENOSCOPY (EGD) WITH PROPOFOL;  Surgeon: [name], MD;  Location: [place];  Service: Gastroenterology;  Laterality: N/A;  . G3 P4 (twins)    . IR CHOLANGIOGRAM EXISTING TUBE  08/18/2018  . IR  EXCHANGE BILIARY DRAIN  11/03/2018  . IR EXCHANGE BILIARY DRAIN  12/01/2018  . IR EXCHANGE BILIARY DRAIN  05/13/2019  . IR EXCHANGE BILIARY DRAIN  06/14/2019  . IR PERC CHOLECYSTOSTOMY  08/16/2018  . REMOVAL OF STONES  08/21/2018   Procedure: REMOVAL OF STONES;  Surgeon: [name], MD;  Location: [place];  Service: Gastroenterology;;  . SKIN CANCER EXCISION    . SPHINCTEROTOMY  08/21/2018   Procedure: SPHINCTEROTOMY;  Surgeon: [name], MD;  Location: [facility] ENDOSCOPY;  Service:  Gastroenterology;;     No outpatient medications have been marked as taking for the 06/25/19 encounter (Appointment) with Loel Dubonnet, NP.     Allergies:   Metronidazole and Penicillin g   Social History   Tobacco Use  . Smoking status: Former Smoker    Quit date: 06/04/1951    Years since quitting: 68.1  . Smokeless tobacco: Never Used  . Tobacco comment: Smoked 1945-1953 , up to 2 cigarettes/day  Substance Use Topics  . Alcohol use: Yes    Alcohol/week: 1.0 standard drinks    Types: 1 Glasses of wine per week  . Drug use: No     Family Hx: The patient's family history includes Breast cancer in her maternal aunt; Heart attack (age of onset: 46) in her daughter; Heart attack (age of onset: 22) in her sister; Hepatitis in her daughter; Liver cancer in her daughter; Lung disease in her mother. There is no history of Diabetes or Stroke.  ROS:   Please see the history of present illness.    Review of Systems  Constitution: Negative for chills, fever and malaise/fatigue.  Cardiovascular: Negative for chest pain, dyspnea on exertion, leg swelling, near-syncope, orthopnea, palpitations and syncope.  Respiratory: Negative for cough, shortness of breath and wheezing.   Gastrointestinal: Negative for nausea and vomiting.  Neurological: Negative for dizziness, light-headedness and weakness.    All other systems reviewed and are negative.  Labs/Other Tests and Data Reviewed:    EKG:  No ECG reviewed.  Recent Labs: 08/14/2018: TSH 9.124 08/15/2018: B Natriuretic Peptide 1,005.7 08/25/2018: Magnesium 1.7 10/30/2018: BUN 19; Creatinine, Ser 0.92; Hemoglobin 12.1; Platelets 251.0; Potassium 5.0; Sodium 136 11/11/2018: ALT 60   Recent Lipid Panel Lab Results  Component Value Date/Time   CHOL 173 01/14/2017 03:12 PM   TRIG 121.0 01/14/2017 03:12 PM   HDL 67.30 01/14/2017 03:12 PM   CHOLHDL 3 01/14/2017 03:12 PM   LDLCALC 81 01/14/2017 03:12 PM   LDLDIRECT 121.4 06/17/2011 11:46  AM    Wt Readings from Last 3 Encounters:  06/22/19 100 lb (45.4 kg)  11/16/18 100 lb (45.4 kg)  08/26/18 120 lb 14.4 oz (54.8 kg)     Objective:    Vital Signs:  There were no vitals taken for this visit.   VITAL SIGNS:  reviewed GEN:  no acute distress RESPIRATORY:  normal respiratory effort, symmetric expansion CARDIOVASCULAR:  no peripheral edema MUSCULOSKELETAL:  no obvious deformities. NEURO:  alert and oriented x 3, no obvious focal deficit PSYCH:  normal affect  ASSESSMENT & PLAN:    1. PAF - Denies recurrence.  Continue amiodarone 200 mg daily.  Continue metoprolol 25 mg BID. Appropriately anticoagulated. 2. Chronic anticoagulation -secondary to PAF.  CHA2DS2-VASc of at least 65 (age, gender).  Denies bleeding complications.  Continue Eliquis 2.5 mg twice daily.  Reduced dose secondary to age and body habitus. 3. On Amiodarone therapy -denies recurrence of atrial fibrillation.  No signs of toxicity.  Overdue for monitoring  of TSH, ALT/AST. 4. Bradycardia - Asymptomatic. Family monitors HR at home. Normally 50s-60s. Continue present medications, as above. 5. Chronic diastolic CHF - Euvolemic and well compensated based on video visit. NYHA I.  6. Abdominal aortic atherosclerosis - Continue optimization of HTN and HLD.  7. HLD - Due for lipid panel. Continue Rosuvastatin 20mg  daily.  8. LE edema - Denies recurrence. No indication for diuretic. Encouraged to keep LE elevated when sitting.   Disposition: Labs at family's convenience to include TSH, CMET, Lipid panel, CBC.   COVID-19 Education: The signs and symptoms of COVID-19 were discussed with the patient and how to seek care for testing (follow up with PCP or arrange E-visit).  The importance of social distancing was discussed today.  Time:   Today, I have spent 15 minutes with the patient with telehealth technology discussing the above problems.     Medication Adjustments/Labs and Tests Ordered: Current medicines  are reviewed at length with the patient today.  Concerns regarding medicines are outlined above.   Tests Ordered: No orders of the defined types were placed in this encounter.   Medication Changes: No orders of the defined types were placed in this encounter.   Follow Up:  In Person in 3 month(s) with Dr. Rockey Situ or APP.   Signed, Loel Dubonnet, NP  06/25/2019 12:57 PM    [facility]

## 2019-06-25 NOTE — Patient Instructions (Signed)
Medication Instructions:  Your physician recommends that you continue on your current medications as directed. Please refer to the Current Medication list given to you today.  *If you need a refill on your cardiac medications before your next appointment, please call your pharmacy*  Lab Work: (We will touch base with Dr. Rockey Situ about labs needed) If you have labs (blood work) drawn today and your tests are completely normal, you will receive your results only by: [name] MyChart Message (if you have MyChart) OR . A paper copy in the mail If you have any lab test that is abnormal or we need to change your treatment, we will call you to review the results.  Testing/Procedures: None ordered   Follow-Up: At [facility], you and your health needs are our priority.  As part of our continuing mission to provide you with exceptional heart care, we have created designated Provider Care Teams.  These Care Teams include your primary Cardiologist (physician) and Advanced Practice Providers (APPs -  Physician Assistants and Nurse Practitioners) who all work together to provide you with the care you need, when you need it.  Your next appointment:   3 month(s)  The format for your next appointment:   In Person or virtual (if we can do your labs prior to this time)  Provider:    You may see Ida Rogue, MD or one of the following Advanced Practice Providers on your designated Care Team:    Murray Hodgkins, NP  Christell Faith, PA-C  Marrianne Mood, PA-C

## 2019-06-28 ENCOUNTER — Telehealth: Payer: Self-pay | Admitting: *Deleted

## 2019-06-28 DIAGNOSIS — I5032 Chronic diastolic (congestive) heart failure: Secondary | ICD-10-CM

## 2019-06-28 DIAGNOSIS — Z79899 Other long term (current) drug therapy: Secondary | ICD-10-CM

## 2019-06-28 DIAGNOSIS — Z13220 Encounter for screening for lipoid disorders: Secondary | ICD-10-CM

## 2019-06-28 NOTE — Telephone Encounter (Signed)
Called and spoke with patient's daughter, Santiago Glad, ok per DPR. She verbalized understanding that patient needs lab work in the next month.  Aware to go to the [facility] at their convenience for the lab work. Orders entered.

## 2019-06-28 NOTE — Telephone Encounter (Signed)
-----   Message from Loel Dubonnet, NP sent at 06/25/2019  9:00 PM EST ----- Spoke with Dr. Rockey Situ after patients televisit. Would like her to have labs done for monitoring as last were >6 months ago. She may have done at [facility] or at a [employer] near her home, whichever is preference. These can be done at the family's convenience preferably sometime within the next month.   Please order TSH, CMET, CBC, lipid panel. Thank you!  Loel Dubonnet, NP

## 2019-07-07 MED FILL — NORMAL SALINE FLUSH SYRINGE: 0.9 | 45 days supply | Qty: 450 | Fill #3

## 2019-07-14 ENCOUNTER — Ambulatory Visit: Payer: Medicare Other | Admitting: Family Medicine

## 2019-07-15 ENCOUNTER — Other Ambulatory Visit (HOSPITAL_COMMUNITY): Payer: Medicare Other

## 2019-07-15 ENCOUNTER — Telehealth: Payer: Self-pay | Admitting: Family Medicine

## 2019-07-15 NOTE — Telephone Encounter (Signed)
Picked up FMLA form from patient from folder up front and put in sign basket patient has an upcoming app.  Alano Blasco,cma

## 2019-07-15 NOTE — Telephone Encounter (Signed)
Kylie May Standard dropped off FMLA paperwork for Dr. Caryl Bis to fill out. She said pt has an appt tomorrow and she wanted him to have this paperwork for the appt. Placed form in colored folders for Dr. Caryl Bis in the front office.

## 2019-07-16 ENCOUNTER — Encounter: Payer: Self-pay | Admitting: Family Medicine

## 2019-07-16 ENCOUNTER — Ambulatory Visit (INDEPENDENT_AMBULATORY_CARE_PROVIDER_SITE_OTHER): Payer: Medicare Other | Admitting: Family Medicine

## 2019-07-16 ENCOUNTER — Other Ambulatory Visit: Payer: Self-pay

## 2019-07-16 DIAGNOSIS — L989 Disorder of the skin and subcutaneous tissue, unspecified: Secondary | ICD-10-CM | POA: Insufficient documentation

## 2019-07-16 DIAGNOSIS — K219 Gastro-esophageal reflux disease without esophagitis: Secondary | ICD-10-CM | POA: Diagnosis not present

## 2019-07-16 DIAGNOSIS — W19XXXA Unspecified fall, initial encounter: Secondary | ICD-10-CM | POA: Diagnosis not present

## 2019-07-16 DIAGNOSIS — E782 Mixed hyperlipidemia: Secondary | ICD-10-CM

## 2019-07-16 DIAGNOSIS — I480 Paroxysmal atrial fibrillation: Secondary | ICD-10-CM

## 2019-07-16 DIAGNOSIS — Z8719 Personal history of other diseases of the digestive system: Secondary | ICD-10-CM | POA: Diagnosis not present

## 2019-07-16 NOTE — Assessment & Plan Note (Signed)
She still has a tube in place for this.  I discussed that long-term having a tube in place there is risk of infection and the need to monitor for signs of that and seek medical attention if it occurs.  They will continue to see her specialist

## 2019-07-16 NOTE — Assessment & Plan Note (Signed)
Plan for lab work.

## 2019-07-16 NOTE — Progress Notes (Signed)
Virtual Visit via video Note  This visit type was conducted due to national recommendations for restrictions regarding the COVID-19 pandemic (e.g. social distancing).  This format is felt to be most appropriate for this patient at this time.  All issues noted in this document were discussed and addressed.  No physical exam was performed (except for noted visual exam findings with Video Visits).   I connected with Kylie May today at  9:00 AM EST by a video enabled telemedicine application fracture base and verified that I am speaking with the correct person using two identifiers. Location patient: home Location provider: work Persons participating in the virtual visit: patient, provider, [name] (daughter)  I discussed the limitations, risks, security and privacy concerns of performing an evaluation and management service by telephone and the availability of in person appointments. I also discussed with the patient that there may be a patient responsible charge related to this service. The patient expressed understanding and agreed to proceed.  Reason for visit: follow-up  HPI: GERD: Taking omeprazole.  Her daughter reports the patient does not complain of any reflux, abdominal pain, or blood in her stool.  She does have occasional trouble swallowing but had a reassuring EGD last year.  A. fib: Taking amiodarone and Eliquis.  No palpitations.  No chest pain.  History of cholecystitis: She still has the tube in place draining her gallbladder.  She has seen general surgery and they recommended cholangiography which revealed patent cystic duct with no cholelithiasis.  They discussed removing the tube though the family is hesitant to do this as the patient has done so well.  They note she follows up with her specialists every 3 months for this.  She notes rare discomfort at the tube site.  Fall: Patient had a fall back in November.  She injured her face and knee.  She was evaluated  with orthopedics for this.  She has pain recovered from this.  The patient's daughter notes the patient does not want to talk about the fall.  Skin lesion: She had a biopsy through dermatology.  The patient's daughter reports this was not skin cancer though may have been precancerous.  She does note some soreness at the site of the biopsy.  This was biopsied about 2 weeks ago.  The patient's daughter has FMLA forms that she would like filled out.  She works in the school system and notes that if she goes back to in school work around children she would not be able to provide care to her mother due to safety risks with COVID-19.   ROS: See pertinent positives and negatives per HPI.  Past Medical History:  Diagnosis Date  . A-fib ([place])   . Acid reflux disease   . Arrhythmia   . Atrial fibrillation with RVR ([place]) 08/15/2018  . Chronic diastolic CHF (congestive heart failure) ([place])   . Compression fracture of lumbar vertebra ([place])   . Diverticulitis 01-2008   [facility]  . Diverticulosis   . Esophageal stricture 2006  . Femoral bruit    Right  . Fibrocystic breast disease   . Hiatal hernia   . History of esophageal stricture 06/14/2004   2006 esophageal dilation Schatzki's ring   . History of skin cancer 12/29/2008   Basal cell cancer right glabella 07/10/2010 Dr. Evorn Gong, [facility] [place] , [place]   . Hypercholesterolemia    [place] study LDL goal = < 160  . Mitral regurgitation    a. 06/2014 EF 55-60%,  elevated end-diastolic pressures, dilated LA at 4.3 cm, mildly dilated RA, severe mitral regurgitation, mild aortic sclerosis without stenosis, mod-severe TR  . Mitral valve prolapse   . Osteoporosis    Dr Matthew Saras  . Pancreatitis 07-2008   Hospitalized   . Patella fracture 05/13/2018  . Schatzki's ring   . Sepsis ([place]) 08/15/2018  . Skin cancer    facial x 2. [name]    Past Surgical History:  Procedure Laterality Date  . BILIARY STENT PLACEMENT  08/21/2018   Procedure:  BILIARY STENT PLACEMENT;  Surgeon: [name], MD;  Location: [facility] ENDOSCOPY;  Service: Gastroenterology;;  . COLONOSCOPY  2006  . Epidural steroids     x1  . ERCP N/A 08/21/2018   Procedure: ENDOSCOPIC RETROGRADE CHOLANGIOPANCREATOGRAPHY (ERCP);  Surgeon: [name], MD;  Location: [place];  Service: Gastroenterology;  Laterality: N/A;  . ESOPHAGEAL DILATION  2006  . ESOPHAGOGASTRODUODENOSCOPY (EGD) WITH PROPOFOL N/A 11/16/2018   Procedure: ESOPHAGOGASTRODUODENOSCOPY (EGD) WITH PROPOFOL;  Surgeon: [name], MD;  Location: [place];  Service: Gastroenterology;  Laterality: N/A;  . G3 P4 (twins)    . IR CHOLANGIOGRAM EXISTING TUBE  08/18/2018  . IR EXCHANGE BILIARY DRAIN  11/03/2018  . IR EXCHANGE BILIARY DRAIN  12/01/2018  . IR EXCHANGE BILIARY DRAIN  05/13/2019  . IR EXCHANGE BILIARY DRAIN  06/14/2019  . IR PERC CHOLECYSTOSTOMY  08/16/2018  . REMOVAL OF STONES  08/21/2018   Procedure: REMOVAL OF STONES;  Surgeon: [name], MD;  Location: [place];  Service: Gastroenterology;;  . SKIN CANCER EXCISION    . SPHINCTEROTOMY  08/21/2018   Procedure: SPHINCTEROTOMY;  Surgeon: [name], MD;  Location[facility] ENDOSCOPY;  Service: Gastroenterology;;    Family History  Problem Relation Age of Onset  . Breast cancer Maternal Aunt   . Hepatitis Daughter        C  . Lung disease Mother        lung tumor  . Heart attack Daughter 32       S/P stents  . Liver cancer Daughter   . Heart attack Sister 41  . Diabetes Neg Hx   . Stroke Neg Hx     SOCIAL HX: Former smoker   Current Outpatient Medications:  .  amiodarone (PACERONE) 200 MG tablet, Take 1 tablet (200 mg total) by mouth daily., Disp: 90 tablet, Rfl: 1 .  apixaban (ELIQUIS) 2.5 MG TABS tablet, Take 1 tablet (2.5 mg total) by mouth 2 (two) times daily., Disp: 180 tablet, Rfl: 1 .  cholecalciferol (VITAMIN D3) 25 MCG (1000 UNIT) tablet, Take 1,000 Units by mouth daily., Disp: , Rfl:  .  ferrous sulfate 325 (65 FE)  MG EC tablet, Take 325 mg by mouth daily., Disp: , Rfl:  .  magnesium chloride (SLOW-MAG) 64 MG TBEC SR tablet, Take 2 tablets (128 mg total) by mouth daily., Disp: 60 tablet, Rfl: 3 .  metoprolol tartrate (LOPRESSOR) 25 MG tablet, Take 1 tablet (25 mg total) by mouth 2 (two) times daily., Disp: 180 tablet, Rfl: 1 .  omeprazole (PRILOSEC) 40 MG capsule, Take 1 capsule (40 mg total) by mouth daily., Disp: 90 capsule, Rfl: 1 .  rosuvastatin (CRESTOR) 20 MG tablet, Take 1 tablet (20 mg total) by mouth at bedtime., Disp: 90 tablet, Rfl: 1 .  triamcinolone ointment (KENALOG) 0.1 %, Apply 1 application topically 2 (two) times daily as needed (rash). , Disp: , Rfl:   EXAM:  VITALS per patient if applicable:  GENERAL: alert, oriented,  appears well and in no acute distress  HEENT: atraumatic, conjunttiva clear, no obvious abnormalities on inspection of external nose and ears  NECK: normal movements of the head and neck  LUNGS: on inspection no signs of respiratory distress, breathing rate appears normal, no obvious gross SOB, gasping or wheezing  CV: no obvious cyanosis  MS: moves all visible extremities without noticeable abnormality  PSYCH/NEURO: pleasant and cooperative, no obvious depression or anxiety, speech and thought processing grossly intact  ASSESSMENT AND PLAN:  Discussed the following assessment and plan:  Paroxysmal atrial fibrillation (HCC) Doing well.  She will continue her current medications managed through cardiology.[name]  GERD (gastroesophageal reflux disease) Continue omeprazole.  Recent EGD reassuring.  History of cholecystitis She still has a tube in place for this.  I discussed that long-term having a tube in place there is risk of infection and the need to monitor for signs of that and seek medical attention if it occurs.  They will continue to see her specialist  Hyperlipidemia Plan for lab work.  Fall Recovered from her most recent fall.  They will monitor and  try to prevent future falls.  Skin lesion Discussed that the soreness should start to improve over the next week or so.  If it does not they should let us know.   No orders of the defined types were placed in this encounter.   No orders of the defined types were placed in this encounter.    I discussed the assessment and treatment plan with the patient. The patient was provided an opportunity to ask questions and all were answered. The patient agreed with the plan and demonstrated an understanding of the instructions.   The patient was advised to call back or seek an in-person evaluation if the symptoms worsen or if the condition fails to improve as anticipated.  FMLA papers will be completed for the patient's daughter.  She is to be contacted at [phone] when these are ready.  Tommi Rumps, MD

## 2019-07-16 NOTE — Assessment & Plan Note (Signed)
Recovered from her most recent fall.  They will monitor and try to prevent future falls.

## 2019-07-16 NOTE — Assessment & Plan Note (Signed)
Doing well.  She will continue her current medications managed through cardiology.[name]

## 2019-07-16 NOTE — Assessment & Plan Note (Signed)
Continue omeprazole.  Recent EGD reassuring.

## 2019-07-16 NOTE — Assessment & Plan Note (Signed)
Discussed that the soreness should start to improve over the next week or so.  If it does not they should let us know.

## 2019-07-18 DIAGNOSIS — Z0279 Encounter for issue of other medical certificate: Secondary | ICD-10-CM

## 2019-07-18 NOTE — Telephone Encounter (Signed)
Completed.  Please make available for pickup. 

## 2019-07-19 ENCOUNTER — Other Ambulatory Visit (HOSPITAL_COMMUNITY): Payer: Self-pay | Admitting: Diagnostic Radiology

## 2019-07-19 ENCOUNTER — Other Ambulatory Visit: Payer: Self-pay

## 2019-07-19 ENCOUNTER — Ambulatory Visit (HOSPITAL_COMMUNITY)
Admission: RE | Admit: 2019-07-19 | Discharge: 2019-07-19 | Disposition: A | Discharge status: Home / Self Care | Payer: Medicare Other | Source: Ambulatory Visit | Attending: Interventional Radiology | Admitting: Interventional Radiology

## 2019-07-19 DIAGNOSIS — Z434 Encounter for attention to other artificial openings of digestive tract: Secondary | ICD-10-CM

## 2019-07-19 HISTORY — PX: IR EXCHANGE BILIARY DRAIN: IMG6046

## 2019-07-19 MED ORDER — IOHEXOL 300 MG/ML  SOLN
50.0000 mL | Freq: Once | INTRAMUSCULAR | Status: AC | PRN
  Administered 2019-07-19: 13:00:00 10 mL

## 2019-07-19 MED ORDER — LIDOCAINE HCL (PF) 1 % IJ SOLN
INTRAMUSCULAR | Status: DC | PRN
  Administered 2019-07-19: 5 mL

## 2019-07-19 MED ORDER — LIDOCAINE HCL 1 % IJ SOLN
INTRAMUSCULAR | Status: AC
  Filled 2019-07-19: qty 20

## 2019-07-19 NOTE — Procedures (Signed)
Interventional Radiology Procedure:   Indications: Cholecystostomy tube needs exchange, concern for drain malfunction.  Procedure: Cholecystostomy tube exchange  Findings: New 10 Fr drain placed in gallbladder  Complications: None     EBL: None    Plan: Plan for routine exchange.    Treydon Henricks R. Anselm Pancoast, MD  Pager: [phone]

## 2019-07-19 NOTE — Telephone Encounter (Signed)
I called and spoke with  mrs. [name] (patient's daughter) and informed her that the FMLA forms are ready for pick up at the front desk.  Angeldejesus Callaham,cma

## 2019-07-23 ENCOUNTER — Telehealth: Payer: Self-pay | Admitting: Family Medicine

## 2019-07-23 NOTE — Telephone Encounter (Signed)
Patient's daughter called and wanted to know why on section 6 of FMLA, he dated 07/16/2019-4 or 9 /2021. End dated needs to be on 10/03/2019. [name], daughter is confused. Please call her, [phone]. She goes back to work on Monday.

## 2019-07-23 NOTE — Telephone Encounter (Signed)
Patient's daughter called and wanted to know why on section 6 of FMLA, he dated 07/16/2019-4 or 9 /2021. End dated needs to be on 10/03/2019. [name], daughter is confused. Please call her, [phone]. She goes back to work on Monday.  Senon Nixon,cma

## 2019-07-25 NOTE — Telephone Encounter (Signed)
I can addend the FMLA if needed. Do we still have the original form?

## 2019-07-26 ENCOUNTER — Other Ambulatory Visit (HOSPITAL_COMMUNITY): Payer: Medicare Other

## 2019-07-26 NOTE — Telephone Encounter (Signed)
Patient called back to check to see if letter and forms have been done.

## 2019-07-26 NOTE — Telephone Encounter (Signed)
The patient is requesting a letter to addend the FMLA stating when when she is to return to work. And it need to be faxed to [facility] @ [phone].  Amarah Brossman,cma

## 2019-07-26 NOTE — Telephone Encounter (Signed)
LM on VM informing the patient daughter taht the provider can amend the FMLA form but she would need to bring the form back.  Jolean Madariaga,cma

## 2019-07-27 NOTE — Telephone Encounter (Signed)
Letter was completed and faxed to cheryl [facility] for the patient today.  Grenda Lora,cma

## 2019-07-27 NOTE — Telephone Encounter (Signed)
Letter completed.

## 2019-08-13 DIAGNOSIS — L905 Scar conditions and fibrosis of skin: Secondary | ICD-10-CM | POA: Diagnosis not present

## 2019-08-17 ENCOUNTER — Ambulatory Visit (HOSPITAL_COMMUNITY)
Admission: RE | Admit: 2019-08-17 | Discharge: 2019-08-17 | Disposition: A | Discharge status: Home / Self Care | Payer: Medicare Other | Source: Ambulatory Visit | Attending: Diagnostic Radiology | Admitting: Diagnostic Radiology

## 2019-08-17 ENCOUNTER — Other Ambulatory Visit: Payer: Self-pay

## 2019-08-17 ENCOUNTER — Other Ambulatory Visit (HOSPITAL_COMMUNITY): Payer: Self-pay | Admitting: Interventional Radiology

## 2019-08-17 DIAGNOSIS — K819 Cholecystitis, unspecified: Secondary | ICD-10-CM | POA: Diagnosis not present

## 2019-08-17 DIAGNOSIS — Z434 Encounter for attention to other artificial openings of digestive tract: Secondary | ICD-10-CM

## 2019-08-17 DIAGNOSIS — Z4659 Encounter for fitting and adjustment of other gastrointestinal appliance and device: Secondary | ICD-10-CM | POA: Diagnosis not present

## 2019-08-17 HISTORY — PX: IR EXCHANGE BILIARY DRAIN: IMG6046

## 2019-08-17 MED ORDER — LIDOCAINE-EPINEPHRINE 1 %-1:100000 IJ SOLN
INTRAMUSCULAR | Status: AC
  Filled 2019-08-17: qty 1

## 2019-08-17 MED ORDER — LIDOCAINE-EPINEPHRINE 1 %-1:100000 IJ SOLN
INTRAMUSCULAR | Status: DC | PRN
  Administered 2019-08-17: 10 mL

## 2019-08-17 MED ORDER — IOHEXOL 300 MG/ML  SOLN
50.0000 mL | Freq: Once | INTRAMUSCULAR | Status: AC | PRN
  Administered 2019-08-17: 10 mL

## 2019-08-17 NOTE — Procedures (Signed)
Pre procedural Dx: Acute cholecysitis Post procedural Dx: Same  Technically successful Fluoro guided exchange and up-sizing of now 12 Fr chole tube. Chole tube connected to gravity bag.  EBL: None  Complications: None immediate  Ronny Bacon, MD Pager #: [phone]

## 2019-08-26 MED FILL — NORMAL SALINE FLUSH SYRINGE: 0.9 | 45 days supply | Qty: 450 | Fill #4

## 2019-08-30 ENCOUNTER — Other Ambulatory Visit (HOSPITAL_COMMUNITY): Payer: Medicare Other

## 2019-09-16 ENCOUNTER — Telehealth: Payer: Self-pay | Admitting: Family Medicine

## 2019-09-16 NOTE — Telephone Encounter (Signed)
Pt is coming in to see Dr. Caryl Bis tomorrow @ 2:45. She wants to know if she needs to fast for blood work? I told her I would ask and give her call back.

## 2019-09-16 NOTE — Telephone Encounter (Signed)
I called and spoke with the patient's daughter and informed her to not have anything to eat for 6 hours for her blood work, I also informed her that there were other labs in the chart and we could draw them here and send the results to Dr. Saunders Revel, she understood.  Sumaiyah Markert,cma

## 2019-09-17 ENCOUNTER — Ambulatory Visit (INDEPENDENT_AMBULATORY_CARE_PROVIDER_SITE_OTHER): Payer: Medicare Other | Admitting: Family Medicine

## 2019-09-17 ENCOUNTER — Other Ambulatory Visit: Payer: Self-pay

## 2019-09-17 ENCOUNTER — Encounter: Payer: Self-pay | Admitting: Family Medicine

## 2019-09-17 VITALS — BP 100/60 | HR 53 | Temp 96.1°F | Ht 64.0 in | Wt 103.2 lb

## 2019-09-17 DIAGNOSIS — I700 Atherosclerosis of aorta: Secondary | ICD-10-CM | POA: Diagnosis not present

## 2019-09-17 DIAGNOSIS — I480 Paroxysmal atrial fibrillation: Secondary | ICD-10-CM | POA: Diagnosis not present

## 2019-09-17 DIAGNOSIS — E782 Mixed hyperlipidemia: Secondary | ICD-10-CM

## 2019-09-17 DIAGNOSIS — E43 Unspecified severe protein-calorie malnutrition: Secondary | ICD-10-CM

## 2019-09-17 DIAGNOSIS — J309 Allergic rhinitis, unspecified: Secondary | ICD-10-CM | POA: Diagnosis not present

## 2019-09-17 DIAGNOSIS — I5032 Chronic diastolic (congestive) heart failure: Secondary | ICD-10-CM

## 2019-09-17 DIAGNOSIS — Z8719 Personal history of other diseases of the digestive system: Secondary | ICD-10-CM

## 2019-09-17 MED ORDER — FLUTICASONE PROPIONATE 50 MCG/ACT NA SUSP: 2.0000 | Freq: Every day | NASAL | 6 refills | Status: DC

## 2019-09-17 NOTE — Assessment & Plan Note (Signed)
Biliary drain still in place.  I encouraged him to continue to see the specialist and discuss removal.

## 2019-09-17 NOTE — Assessment & Plan Note (Signed)
Trial Flonase 

## 2019-09-17 NOTE — Assessment & Plan Note (Signed)
Asymptomatic. Monitor. Labs per below.

## 2019-09-17 NOTE — Assessment & Plan Note (Signed)
Check lipid panel  

## 2019-09-17 NOTE — Patient Instructions (Signed)
Nice to see you. We will get lab work today. Please try the Flonase.

## 2019-09-17 NOTE — Assessment & Plan Note (Signed)
Check protein levels. Continue adequate diet.

## 2019-09-17 NOTE — Progress Notes (Signed)
Tommi Rumps, MD Phone: [phone]  Kylie May is a 86 y.o. female who presents today for f/u.  Afib:  Meds- amiodarone, eliquis, metoprolol. Palpitations- no Bleeding issues- no  CHF: Dyspnea- no Orthopnea- no PND- no Edema- no  Protein calorie malnutrition:  Diet- eating well Appetite- good  Biliary drain: Kylie May is still in place.  There is bilious fluid in the bag.  Kylie May every few months to see the specialist.  There is apprehension within the family for having Kylie May removed.  Skin lesion: Kylie May is on Kylie May left lateral malleolus.  A note Kylie May saw dermatology and had the area biopsied.  It was precancerous.  The wound has been slow to heal though is progressively improving with soaking it and applying Vaseline.  Allergies: Kylie May notes postnasal drainage with mucus.  Occasional tickle in Kylie May throat.  Some sneezing.  Antihistamines cause rash.  Kylie May has been going on for greater than a year.  Social History   Tobacco Use  Smoking Status Former Smoker  . Quit date: 06/04/1951  . Years since quitting: 68.3  Smokeless Tobacco Never Used  Tobacco Comment   Smoked 1945-1953 , up to 2 cigarettes/day     ROS see history of present illness  Objective  Physical Exam Vitals:   09/17/19 1502  BP: 100/60  Pulse: (!) 53  Temp: (!) 96.1 F (35.6 C)    BP Readings from Last 3 Encounters:  09/17/19 100/60  11/16/18 (!) 143/62  09/30/18 (!) 114/49   Wt Readings from Last 3 Encounters:  09/17/19 103 lb 3.2 oz (46.8 kg)  07/16/19 102 lb (46.3 kg)  06/25/19 102 lb (46.3 kg)    Physical Exam Constitutional:      General: Kylie May is not in acute distress.    Appearance: Kylie May is not diaphoretic.  Cardiovascular:     Rate and Rhythm: Normal rate and regular rhythm.     Heart sounds: Normal heart sounds.  Pulmonary:     Effort: Pulmonary effort is normal.     Breath sounds: Normal breath sounds.  Musculoskeletal:     Right lower leg: No edema.     Left lower  leg: No edema.       Legs:  Skin:    General: Skin is warm and dry.  Neurological:     Mental Status: Kylie May is alert.      Assessment/Plan: Please see individual problem list.  Paroxysmal atrial fibrillation (HCC) Asymptomatic. Continue current regimen through cardiology. Labs ordered for cardiology.   Chronic diastolic CHF (congestive heart failure) (HCC) Asymptomatic. Monitor. Labs per below.   Abdominal aortic atherosclerosis (HCC) Risk factor management.  Severe protein-calorie malnutrition (HCC) Check protein levels. Continue adequate diet.   Hyperlipidemia Check lipid panel.   History of cholecystitis Biliary drain still in place.  I encouraged him to continue to see the specialist and discuss removal.  Allergic rhinitis Trial Flonase.   Orders Placed Kylie May Encounter  Procedures  . CBC w/Diff  . Comp Met (CMET)  . Lipid panel  . TSH    Meds ordered Kylie May encounter  Medications  . fluticasone (FLONASE) 50 MCG/ACT nasal spray    Sig: Place 2 sprays into both nostrils daily.    Dispense:  16 g    Refill:  6    Kylie May visit occurred during the SARS-CoV-2 public health emergency.  Safety protocols were in place, including screening questions prior to the visit, additional usage of staff PPE, and extensive cleaning of exam room  while observing appropriate contact time as indicated for disinfecting solutions.    Tommi Rumps, MD [place]

## 2019-09-17 NOTE — Assessment & Plan Note (Signed)
Risk factor management. 

## 2019-09-17 NOTE — Assessment & Plan Note (Signed)
Asymptomatic. Continue current regimen through cardiology. Labs ordered for cardiology.

## 2019-09-18 LAB — CBC WITH DIFFERENTIAL/PLATELET
Absolute Monocytes: 730 cells/uL (ref 200–950)
Basophils Absolute: 53 cells/uL (ref 0–200)
Basophils Relative: 0.6 %
Eosinophils Absolute: 141 cells/uL (ref 15–500)
Eosinophils Relative: 1.6 %
HCT: 40.2 % (ref 35.0–45.0)
Hemoglobin: 13.4 g/dL (ref 11.7–15.5)
Lymphs Abs: 2578 cells/uL (ref 850–3900)
MCH: 32 pg (ref 27.0–33.0)
MCHC: 33.3 g/dL (ref 32.0–36.0)
MCV: 95.9 fL (ref 80.0–100.0)
MPV: 11.4 fL (ref 7.5–12.5)
Monocytes Relative: 8.3 %
Neutro Abs: 5298 cells/uL (ref 1500–7800)
Neutrophils Relative %: 60.2 %
Platelets: 239 10*3/uL (ref 140–400)
RBC: 4.19 10*6/uL (ref 3.80–5.10)
RDW: 12.2 % (ref 11.0–15.0)
Total Lymphocyte: 29.3 %
WBC: 8.8 10*3/uL (ref 3.8–10.8)

## 2019-09-18 LAB — COMPREHENSIVE METABOLIC PANEL
AG Ratio: 1.6 (calc) (ref 1.0–2.5)
ALT: 77 U/L — ABNORMAL HIGH (ref 6–29)
AST: 70 U/L — ABNORMAL HIGH (ref 10–35)
Albumin: 4.1 g/dL (ref 3.6–5.1)
Alkaline phosphatase (APISO): 62 U/L (ref 37–153)
BUN/Creatinine Ratio: 11 (calc) (ref 6–22)
BUN: 10 mg/dL (ref 7–25)
CO2: 26 mmol/L (ref 20–32)
Calcium: 9.6 mg/dL (ref 8.6–10.4)
Chloride: 98 mmol/L (ref 98–110)
Creat: 0.91 mg/dL — ABNORMAL HIGH (ref 0.60–0.88)
Globulin: 2.6 g/dL (calc) (ref 1.9–3.7)
Glucose, Bld: 96 mg/dL (ref 65–99)
Potassium: 4.4 mmol/L (ref 3.5–5.3)
Sodium: 132 mmol/L — ABNORMAL LOW (ref 135–146)
Total Bilirubin: 0.6 mg/dL (ref 0.2–1.2)
Total Protein: 6.7 g/dL (ref 6.1–8.1)

## 2019-09-18 LAB — TSH: TSH: 14.38 mIU/L — ABNORMAL HIGH (ref 0.40–4.50)

## 2019-09-18 LAB — LIPID PANEL
Cholesterol: 127 mg/dL (ref <200)
HDL: 64 mg/dL (ref >=50)
LDL Cholesterol (Calc): 47 mg/dL (calc)
Non-HDL Cholesterol (Calc): 63 mg/dL (calc) (ref <130)
Total CHOL/HDL Ratio: 2 (calc) (ref <5.0)
Triglycerides: 78 mg/dL (ref <150)

## 2019-09-27 ENCOUNTER — Other Ambulatory Visit: Payer: Self-pay

## 2019-09-27 ENCOUNTER — Telehealth: Payer: Self-pay | Admitting: Cardiovascular Disease

## 2019-09-27 ENCOUNTER — Ambulatory Visit: Payer: Medicare Other | Admitting: Cardiovascular Disease

## 2019-09-27 MED ORDER — OMEPRAZOLE 40 MG PO CPDR: 40.0000 mg | DELAYED_RELEASE_CAPSULE | Freq: Every day | ORAL | 1 refills | Status: DC

## 2019-09-27 NOTE — Telephone Encounter (Signed)
*  STAT* If patient is at the pharmacy, call can be transferred to refill team.   1. Which medications need to be refilled? (please list name of each medication and dose if known)   Omeprazole 40 mg po q  D   2. Which pharmacy/location (including street and city if local pharmacy) is medication [facility]   3. Do they need a 30 day or 90 day supply? [place]

## 2019-09-27 NOTE — Telephone Encounter (Signed)
omeprazole (PRILOSEC) 40 MG capsule 90 capsule 1 09/27/2019    Sig - Route: Take 1 capsule (40 mg total) by mouth daily. - Oral   Sent to pharmacy as: omeprazole (PRILOSEC) 40 MG capsule   E-Prescribing Status: Receipt confirmed by pharmacy (09/27/2019 12:02 PM EDT)   [place], [place]

## 2019-09-30 ENCOUNTER — Other Ambulatory Visit: Payer: Self-pay | Admitting: Family Medicine

## 2019-09-30 ENCOUNTER — Ambulatory Visit (HOSPITAL_COMMUNITY)
Admission: RE | Admit: 2019-09-30 | Discharge: 2019-09-30 | Disposition: A | Discharge status: Home / Self Care | Payer: Medicare Other | Source: Ambulatory Visit | Attending: Interventional Radiology | Admitting: Interventional Radiology

## 2019-09-30 ENCOUNTER — Other Ambulatory Visit: Payer: Self-pay

## 2019-09-30 DIAGNOSIS — Z4803 Encounter for change or removal of drains: Secondary | ICD-10-CM | POA: Insufficient documentation

## 2019-09-30 DIAGNOSIS — K8000 Calculus of gallbladder with acute cholecystitis without obstruction: Secondary | ICD-10-CM | POA: Diagnosis not present

## 2019-09-30 DIAGNOSIS — Z434 Encounter for attention to other artificial openings of digestive tract: Secondary | ICD-10-CM

## 2019-09-30 DIAGNOSIS — R7989 Other specified abnormal findings of blood chemistry: Secondary | ICD-10-CM

## 2019-09-30 DIAGNOSIS — E039 Hypothyroidism, unspecified: Secondary | ICD-10-CM

## 2019-09-30 HISTORY — PX: IR EXCHANGE BILIARY DRAIN: IMG6046

## 2019-09-30 MED ORDER — LIDOCAINE HCL 1 % IJ SOLN
INTRAMUSCULAR | Status: DC | PRN
  Administered 2019-09-30: 5 mL

## 2019-09-30 MED ORDER — IOHEXOL 300 MG/ML  SOLN
50.0000 mL | Freq: Once | INTRAMUSCULAR | Status: AC | PRN
  Administered 2019-09-30: 10 mL

## 2019-09-30 MED ORDER — LIDOCAINE HCL 1 % IJ SOLN
INTRAMUSCULAR | Status: AC
  Filled 2019-09-30: qty 20

## 2019-09-30 NOTE — Progress Notes (Signed)
Patient presented to IR today for routine drain exchange - during procedure patient was noted to have erythema, tenderness and a foul smell from insertion site. Bile noted to be clear, not concerning for infectious process. Area in question appears to be consistent with yeast - rx given for Fluconazole 150 mg PO x 1 and Nystatin cream BID x 14 days.   Rx and care plan reviewed with patient's grandson today who is primary caregiver for Kylie May - he was encouraged to call if area appears to worsen or is not improving after finishing rx.   Candiss Norse, PA-C

## 2019-10-01 ENCOUNTER — Other Ambulatory Visit (INDEPENDENT_AMBULATORY_CARE_PROVIDER_SITE_OTHER): Payer: Medicare Other

## 2019-10-01 ENCOUNTER — Other Ambulatory Visit: Payer: Medicare Other

## 2019-10-01 DIAGNOSIS — R7989 Other specified abnormal findings of blood chemistry: Secondary | ICD-10-CM

## 2019-10-01 DIAGNOSIS — E039 Hypothyroidism, unspecified: Secondary | ICD-10-CM | POA: Diagnosis not present

## 2019-10-01 LAB — T3, FREE: T3, Free: 2.6 pg/mL (ref 2.3–4.2)

## 2019-10-01 LAB — T4, FREE: Free T4: 0.81 ng/dL (ref 0.60–1.60)

## 2019-10-05 ENCOUNTER — Other Ambulatory Visit: Payer: Self-pay | Admitting: Family Medicine

## 2019-10-05 DIAGNOSIS — E039 Hypothyroidism, unspecified: Secondary | ICD-10-CM | POA: Insufficient documentation

## 2019-10-05 MED ORDER — LEVOTHYROXINE SODIUM 25 MCG PO TABS: 25.0000 ug | ORAL_TABLET | Freq: Every day | ORAL | 1 refills | Status: DC

## 2019-10-11 ENCOUNTER — Telehealth: Payer: Self-pay | Admitting: Family Medicine

## 2019-10-11 NOTE — Telephone Encounter (Signed)
Just started patient on synthroid. Message sent back to cardiology.

## 2019-10-11 NOTE — Progress Notes (Signed)
Date:  10/12/2019   ID:  Kylie May, DOB December 22, 1921, MRN [id number]  Patient Location:  [place] [address]   Provider location:   [facility], [place] office  PCP:  [name], MD  Cardiologist:  [name]   Chief Complaint  Patient presents with  . OTHER    3 month f/u no complaints today. Meds reviewed verbally with pt.     History of Present Illness:    Kylie May is a 86 y.o. female  past medical history of paroxysmal asymptomatic a-fib,  HTN,  hiatal hernia,  osteopenia,  degenerative joint disease,  Scoliosis Long history of falls, 2018 prior fall with pelvic fracture admitted to [facility] 1/7-1/10/16 for new onset a-fibAndmild diastolic CHF, who presents today for follow-up of her atrial fibrillation,aortic atherosclerosis  To recap her history from 2020, severe epigastric pain from 08/15/2018-08/26/2018 CT of her abdomen and pelvis which showed intra-and extrahepatic ductal dilatation and gallbladder wall thickening but no stones  transferred to [facility] for ERCP. She underwent cholecystostomy tube placement by interventional radiology and subsequently underwent ERCP which showed dilatation of the bile duct and cholelithiasis. She had the stone removed and had a biliary sphincterectomy performed and a plastic stent was placed in the pancreatic duct  She developed A. fib with RVR complicated by decompensated heart failure with fluid retention.  She was on anticoagulation with apixaban amiodarone metoprolol placed on oral Lasix  Suffered severe calorie protein malnutrition Lower extremity edema, anemia On last clinic visit recommended to decrease amiodarone dosing, continue metoprolol, on anticoagulation, continue Lasix as needed for leg swelling -She had a cholecystostomy bag.,  Biliary drain was in place Scheduled to see IR on last visit  very hard of hearing,  Family  present   Seen on a visit last week At that time several medication changes were made  metoprolol down to 25 BID for bradycardia, amiodarone 200 daily Lasix as needed  In follow-up today she is not taking Lasix, Unsteady gait, chronic back pain Sits with her legs down, has some left ankle swelling Family has declined taking out biliary drain, worried about Covid or  complications  They report her appetite is stable On iron pill Has loose bowel movements Denies any arrhythmia  EKG personally reviewed by myself on todays visit Shows sinus bradycardia rate 56 bpm poor R wave progression to the anterior precordial leads   Lab work March 2020 Albumin 2.2, HCT low,    Prior CV studies:   The following studies were reviewed today:    Past Medical History:  Diagnosis Date  . A-fib ([place])   . Acid reflux disease   . Arrhythmia   . Atrial fibrillation with RVR ([name]) 08/15/2018  . Chronic diastolic CHF (congestive heart failure) ([name])   . Compression fracture of lumbar vertebra ([name])   . Diverticulitis 01-2008   GI [facility]  . Diverticulosis   . Esophageal stricture 2006  . Femoral bruit    Right  . Fibrocystic breast disease   . Hiatal hernia   . History of esophageal stricture 06/14/2004   2006 esophageal dilation Schatzki's ring   . History of skin cancer 12/29/2008   Basal cell cancer right glabella 07/10/2010 Dr. Evorn Gong, [facility] [place] , [place]   . Hypercholesterolemia    [place] study LDL goal = < 160  . Mitral regurgitation    a. 06/2014 EF 55-60%, elevated end-diastolic pressures,  dilated LA at 4.3 cm, mildly dilated RA, severe mitral regurgitation, mild aortic sclerosis without stenosis, mod-severe TR  . Mitral valve prolapse   . Osteoporosis    Dr Matthew Saras  . Pancreatitis 07-2008   Hospitalized   . Patella fracture 05/13/2018  . Schatzki's ring   . Sepsis ([name]) 08/15/2018  . Skin cancer    facial x 2. [name]   Past Surgical History:  Procedure  Laterality Date  . BILIARY STENT PLACEMENT  08/21/2018   Procedure: BILIARY STENT PLACEMENT;  Surgeon: [name], MD;  Location: [facility] ENDOSCOPY;  Service: Gastroenterology;;  . COLONOSCOPY  2006  . Epidural steroids     x1  . ERCP N/A 08/21/2018   Procedure: ENDOSCOPIC RETROGRADE CHOLANGIOPANCREATOGRAPHY (ERCP);  Surgeon: [name], MD;  Location: [place];  Service: Gastroenterology;  Laterality: N/A;  . ESOPHAGEAL DILATION  2006  . ESOPHAGOGASTRODUODENOSCOPY (EGD) WITH PROPOFOL N/A 11/16/2018   Procedure: ESOPHAGOGASTRODUODENOSCOPY (EGD) WITH PROPOFOL;  Surgeon: [name], MD;  Location: [place];  Service: Gastroenterology;  Laterality: N/A;  . G3 P4 (twins)    . IR CHOLANGIOGRAM EXISTING TUBE  08/18/2018  . IR EXCHANGE BILIARY DRAIN  11/03/2018  . IR EXCHANGE BILIARY DRAIN  12/01/2018  . IR EXCHANGE BILIARY DRAIN  05/13/2019  . IR EXCHANGE BILIARY DRAIN  06/14/2019  . IR EXCHANGE BILIARY DRAIN  07/19/2019  . IR EXCHANGE BILIARY DRAIN  08/17/2019  . IR EXCHANGE BILIARY DRAIN  09/30/2019  . IR PERC CHOLECYSTOSTOMY  08/16/2018  . REMOVAL OF STONES  08/21/2018   Procedure: REMOVAL OF STONES;  Surgeon: [name], MD;  Location: [place];  Service: Gastroenterology;;  . SKIN CANCER EXCISION    . SPHINCTEROTOMY  08/21/2018   Procedure: SPHINCTEROTOMY;  Surgeon: [name], MD;  Location: [facility] ENDOSCOPY;  Service: Gastroenterology;;     Current Meds  Medication Sig  . amiodarone (PACERONE) 200 MG tablet Take 1 tablet (200 mg total) by mouth daily.  [name] apixaban (ELIQUIS) 2.5 MG TABS tablet Take 1 tablet (2.5 mg total) by mouth 2 (two) times daily.  . cholecalciferol (VITAMIN D3) 25 MCG (1000 UNIT) tablet Take 1,000 Units by mouth daily.  . ferrous sulfate 325 (65 FE) MG EC tablet Take 325 mg by mouth daily.  . fluticasone (FLONASE) 50 MCG/ACT nasal spray Place 2 sprays into both nostrils daily.  [name] levothyroxine (SYNTHROID) 25 MCG tablet Take 1 tablet (25 mcg total) by mouth  daily.  . magnesium chloride (SLOW-MAG) 64 MG TBEC SR tablet Take 2 tablets (128 mg total) by mouth daily.  . metoprolol tartrate (LOPRESSOR) 25 MG tablet Take 1 tablet (25 mg total) by mouth 2 (two) times daily.  [name] omeprazole (PRILOSEC) 40 MG capsule Take 1 capsule (40 mg total) by mouth daily.  . rosuvastatin (CRESTOR) 20 MG tablet Take 1 tablet (20 mg total) by mouth at bedtime.     Allergies:   Metronidazole and Penicillin g   Social History   Tobacco Use  . Smoking status: Former Smoker    Quit date: 06/04/1951    Years since quitting: 68.4  . Smokeless tobacco: Never Used  . Tobacco comment: Smoked 1945-1953 , up to 2 cigarettes/day  Substance Use Topics  . Alcohol use: Yes    Alcohol/week: 1.0 standard drinks    Types: 1 Glasses of wine per week  . Drug use: No     Current Outpatient Medications on File Prior to Visit  Medication Sig Dispense Refill  . amiodarone (PACERONE) 200  MG tablet Take 1 tablet (200 mg total) by mouth daily. 90 tablet 1  . apixaban (ELIQUIS) 2.5 MG TABS tablet Take 1 tablet (2.5 mg total) by mouth 2 (two) times daily. 180 tablet 1  . cholecalciferol (VITAMIN D3) 25 MCG (1000 UNIT) tablet Take 1,000 Units by mouth daily.    . ferrous sulfate 325 (65 FE) MG EC tablet Take 325 mg by mouth daily.    . fluticasone (FLONASE) 50 MCG/ACT nasal spray Place 2 sprays into both nostrils daily. 16 g 6  . levothyroxine (SYNTHROID) 25 MCG tablet Take 1 tablet (25 mcg total) by mouth daily. 90 tablet 1  . magnesium chloride (SLOW-MAG) 64 MG TBEC SR tablet Take 2 tablets (128 mg total) by mouth daily. 60 tablet 3  . metoprolol tartrate (LOPRESSOR) 25 MG tablet Take 1 tablet (25 mg total) by mouth 2 (two) times daily. 180 tablet 1  . omeprazole (PRILOSEC) 40 MG capsule Take 1 capsule (40 mg total) by mouth daily. 90 capsule 1  . rosuvastatin (CRESTOR) 20 MG tablet Take 1 tablet (20 mg total) by mouth at bedtime. 90 tablet 1   No current facility-administered  medications on file prior to visit.     Family Hx: The patient's family history includes Breast cancer in her maternal aunt; Heart attack (age of onset: 6) in her daughter; Heart attack (age of onset: 86) in her sister; Hepatitis in her daughter; Liver cancer in her daughter; Lung disease in her mother. There is no history of Diabetes or Stroke.  ROS:   Please see the history of present illness.    Review of Systems  Constitutional: Negative.   Respiratory: Negative.   Cardiovascular: Positive for leg swelling.  Gastrointestinal: Negative.   Musculoskeletal: Negative.   Neurological: Negative.   Psychiatric/Behavioral: Negative.   All other systems reviewed and are negative.     Labs/Other Tests and Data Reviewed:    Recent Labs: 09/17/2019: ALT 77; BUN 10; Creat 0.91; Hemoglobin 13.4; Platelets 239; Potassium 4.4; Sodium 132; TSH 14.38   Recent Lipid Panel Lab Results  Component Value Date/Time   CHOL 127 09/17/2019 03:15 PM   TRIG 78 09/17/2019 03:15 PM   HDL 64 09/17/2019 03:15 PM   CHOLHDL 2.0 09/17/2019 03:15 PM   LDLCALC 47 09/17/2019 03:15 PM   LDLDIRECT 121.4 06/17/2011 11:46 AM    Wt Readings from Last 3 Encounters:  10/12/19 105 lb 4 oz (47.7 kg)  09/17/19 103 lb 3.2 oz (46.8 kg)  07/16/19 102 lb (46.3 kg)     Exam:    BP 124/64 (BP Location: Left Arm, Patient Position: Sitting, Cuff Size: Normal)   Pulse (!) 56   Ht 5' (1.524 m)   Wt 105 lb 4 oz (47.7 kg)   SpO2 98%   BMI 20.56 kg/m   Constitutional:  oriented to person, place, and time. No distress.  Hard of hearing HENT:  Head: Grossly normal Eyes:  no discharge. No scleral icterus.  Neck: No JVD, no carotid bruits  Cardiovascular: Regular rate and rhythm, no murmurs appreciated Pulmonary/Chest: Clear to auscultation bilaterally, no wheezes or rails Abdominal: Soft.  no distension.  no tenderness.  Musculoskeletal: Normal range of motion Neurological:  normal muscle tone. Coordination normal.  No atrophy Skin: Skin warm and dry Psychiatric: normal affect, pleasant   ASSESSMENT & PLAN:    Paroxysmal atrial fibrillation (HCC) Asymptomatic sinus bradycardia Ideally would like to decrease metoprolol down to lower dose but will continue current doses metoprolol 25  twice daily a near 200 daily Family nervous about making any changes as she has been stable Recommend a monitor blood pressure heart rate periodically through the week call us for worsening bradycardia  Chronic diastolic CHF (congestive heart failure) (HCC) Has not been taking Lasix, Has some trace ankle swelling on the left likely component of dependent edema, recommended compression hose or leg elevation, Lasix as needed  Abdominal aortic atherosclerosis ([place]) No further work-up needed given age  Pure hypercholesterolemia Previous LDL 80 Stable  Severe protein-calorie malnutrition ([place]) Diet improved, still low weight Encouraged supplemental nutrition  Leg edema Trace edema left lower extremity, recommended leg elevation compressions   Total encounter time more than 25 minutes  Greater than 50% was spent in counseling and coordination of care with the patient   Disposition: Follow-up in 6 months   Signed, Ida Rogue, MD  10/12/2019 9:10 AM    [place] [id number], [place], [place] [address]

## 2019-10-11 NOTE — Telephone Encounter (Signed)
-----   Message from Minna Merritts, MD sent at 10/09/2019  9:36 PM EDT ----- Could we go up on her synthroid? She is otherwise doing well on low dose amio Flecainide did not work as well for her Thx TG ----- Message ----- From: [name], MD Sent: 09/30/2019   1:24 PM EDT To: Minna Merritts, MD  Orders placed. Forwarding to Dr Rockey Situ so he is aware of her TSH level as she is on amiodarone.

## 2019-10-12 ENCOUNTER — Ambulatory Visit (INDEPENDENT_AMBULATORY_CARE_PROVIDER_SITE_OTHER): Payer: Medicare Other | Admitting: Cardiovascular Disease

## 2019-10-12 ENCOUNTER — Encounter: Payer: Self-pay | Admitting: Cardiovascular Disease

## 2019-10-12 ENCOUNTER — Other Ambulatory Visit: Payer: Self-pay

## 2019-10-12 VITALS — BP 124/64 | HR 56 | Ht 60.0 in | Wt 105.2 lb

## 2019-10-12 DIAGNOSIS — I480 Paroxysmal atrial fibrillation: Secondary | ICD-10-CM | POA: Diagnosis not present

## 2019-10-12 DIAGNOSIS — E43 Unspecified severe protein-calorie malnutrition: Secondary | ICD-10-CM | POA: Diagnosis not present

## 2019-10-12 DIAGNOSIS — I5032 Chronic diastolic (congestive) heart failure: Secondary | ICD-10-CM

## 2019-10-12 DIAGNOSIS — Z7901 Long term (current) use of anticoagulants: Secondary | ICD-10-CM

## 2019-10-12 DIAGNOSIS — I4891 Unspecified atrial fibrillation: Secondary | ICD-10-CM

## 2019-10-12 DIAGNOSIS — R600 Localized edema: Secondary | ICD-10-CM

## 2019-10-12 DIAGNOSIS — I700 Atherosclerosis of aorta: Secondary | ICD-10-CM | POA: Diagnosis not present

## 2019-10-12 NOTE — Patient Instructions (Signed)

## 2019-11-23 MED FILL — NORMAL SALINE FLUSH SYRINGE: 0.9 | 60 days supply | Qty: 600 | Fill #0

## 2019-11-24 ENCOUNTER — Telehealth (HOSPITAL_COMMUNITY): Payer: Self-pay

## 2019-11-24 ENCOUNTER — Other Ambulatory Visit (HOSPITAL_COMMUNITY): Payer: Self-pay | Admitting: Interventional Radiology

## 2019-11-24 DIAGNOSIS — Z434 Encounter for attention to other artificial openings of digestive tract: Secondary | ICD-10-CM

## 2019-11-24 NOTE — Telephone Encounter (Signed)
Called to schedule drain exchange, no answer, vm full. AW  

## 2019-12-08 ENCOUNTER — Other Ambulatory Visit: Payer: Self-pay

## 2019-12-08 ENCOUNTER — Ambulatory Visit (HOSPITAL_COMMUNITY)
Admission: RE | Admit: 2019-12-08 | Discharge: 2019-12-08 | Disposition: A | Discharge status: Home / Self Care | Payer: Medicare Other | Source: Ambulatory Visit | Attending: Interventional Radiology | Admitting: Interventional Radiology

## 2019-12-08 ENCOUNTER — Other Ambulatory Visit (HOSPITAL_COMMUNITY): Payer: Self-pay | Admitting: Interventional Radiology

## 2019-12-08 DIAGNOSIS — K8045 Calculus of bile duct with chronic cholecystitis with obstruction: Secondary | ICD-10-CM

## 2019-12-08 DIAGNOSIS — Z4803 Encounter for change or removal of drains: Secondary | ICD-10-CM | POA: Insufficient documentation

## 2019-12-08 DIAGNOSIS — Z4659 Encounter for fitting and adjustment of other gastrointestinal appliance and device: Secondary | ICD-10-CM | POA: Diagnosis not present

## 2019-12-08 DIAGNOSIS — Z434 Encounter for attention to other artificial openings of digestive tract: Secondary | ICD-10-CM

## 2019-12-08 HISTORY — PX: IR EXCHANGE BILIARY DRAIN: IMG6046

## 2019-12-08 MED ORDER — LIDOCAINE HCL (PF) 1 % IJ SOLN
INTRAMUSCULAR | Status: DC | PRN
  Administered 2019-12-08: 5 mL

## 2019-12-08 MED ORDER — LIDOCAINE HCL 1 % IJ SOLN
INTRAMUSCULAR | Status: AC
  Filled 2019-12-08: qty 20

## 2019-12-08 MED ORDER — IOHEXOL 300 MG/ML  SOLN
50.0000 mL | Freq: Once | INTRAMUSCULAR | Status: AC | PRN
  Administered 2019-12-08: 10 mL

## 2019-12-10 ENCOUNTER — Telehealth: Payer: Self-pay | Admitting: Cardiovascular Disease

## 2019-12-10 MED ORDER — ROSUVASTATIN CALCIUM 20 MG PO TABS: 20.0000 mg | ORAL_TABLET | Freq: Every day | ORAL | 0 refills | Status: DC

## 2019-12-10 MED ORDER — ROSUVASTATIN CALCIUM 20 MG PO TABS: 20.0000 mg | ORAL_TABLET | Freq: Every day | ORAL | 1 refills | Status: DC

## 2019-12-10 NOTE — Telephone Encounter (Signed)
[facility] by [employer] (Advertising copywriter)

## 2019-12-10 NOTE — Addendum Note (Signed)
Addended by: Raelene Bott, Nijah Tejera L on: 12/10/2019 09:29 AM   Modules accepted: Orders

## 2019-12-10 NOTE — Telephone Encounter (Signed)
Requested Prescriptions   Signed Prescriptions Disp Refills   rosuvastatin (CRESTOR) 20 MG tablet 14 tablet 0    Sig: Take 1 tablet (20 mg total) by mouth at bedtime.    Authorizing Provider: Minna Merritts    Ordering User: Raelene Bott, Odetta Forness L

## 2019-12-10 NOTE — Telephone Encounter (Signed)
°*  STAT* If patient is at the pharmacy, call can be transferred to refill team.   1. Which medications need to be refilled? (please list name of each medication and dose if known) ?Crestor  20 mg daily  2. Which pharmacy/location (including street and city if local pharmacy) is medication to be sent to? [facility]  3. Do they need a 30 day or 90 day supply? 14 days to [facility] and 90 day for [facility]  Patients daughter also stated that there have been many issues with mediaction refills. Patient was seen in January and only had 1 refill listed on med list

## 2019-12-10 NOTE — Telephone Encounter (Signed)
Requested Prescriptions   Signed Prescriptions Disp Refills   rosuvastatin (CRESTOR) 20 MG tablet 90 tablet 1    Sig: Take 1 tablet (20 mg total) by mouth at bedtime.    Authorizing Provider: Minna Merritts    Ordering User: Raelene Bott, Brynlee Pennywell L

## 2019-12-17 ENCOUNTER — Ambulatory Visit: Payer: Medicare Other | Admitting: Family Medicine

## 2019-12-31 ENCOUNTER — Other Ambulatory Visit: Payer: Self-pay

## 2019-12-31 ENCOUNTER — Telehealth: Payer: Self-pay | Admitting: Family Medicine

## 2019-12-31 NOTE — Telephone Encounter (Signed)
PT's grandson wanted a call back because pt had one of the symptom of the  Travel screening questions and she could not come in office wanted to talk to Dr.Sonnenberg about this

## 2019-12-31 NOTE — Telephone Encounter (Signed)
I called the patient grandson and could not leave a message because the mailbox was full.  Issa Kosmicki,cma

## 2019-12-31 NOTE — Telephone Encounter (Signed)
Please try to call them back on Monday.

## 2020-01-04 ENCOUNTER — Encounter: Payer: Self-pay | Admitting: Family Medicine

## 2020-01-04 ENCOUNTER — Other Ambulatory Visit: Payer: Self-pay

## 2020-01-04 ENCOUNTER — Telehealth (INDEPENDENT_AMBULATORY_CARE_PROVIDER_SITE_OTHER): Payer: Medicare Other | Admitting: Family Medicine

## 2020-01-04 DIAGNOSIS — J309 Allergic rhinitis, unspecified: Secondary | ICD-10-CM | POA: Diagnosis not present

## 2020-01-04 DIAGNOSIS — E039 Hypothyroidism, unspecified: Secondary | ICD-10-CM | POA: Diagnosis not present

## 2020-01-04 NOTE — Progress Notes (Signed)
Virtual Visit via telephone Note  This visit type was conducted due to national recommendations for restrictions regarding the COVID-19 pandemic (e.g. social distancing).  This format is felt to be most appropriate for this patient at this time.  All issues noted in this document were discussed and addressed.  No physical exam was performed (except for noted visual exam findings with Video Visits).   I connected with Kylie May today at 12:00 PM EDT by a video enabled telemedicine application or telephone and verified that I am speaking with the correct person using two identifiers. Location patient: home Location provider: work Persons participating in the virtual visit: patient, provider, Paula Compton (grandson)  I discussed the limitations, risks, security and privacy concerns of performing an evaluation and management service by telephone and the availability of in person appointments. I also discussed with the patient that there may be a patient responsible charge related to this service. The patient expressed understanding and agreed to proceed.  Interactive audio and video telecommunications were attempted between this provider and patient, however failed, due to patient having technical difficulties OR patient did not have access to video capability.  We continued and completed visit with audio only.   Reason for visit: same day  HPI: Cough/congestion: This been going on about 3 weeks.  Has been progressively improving.  They had daughter come into the house had a cold and that person tested negative for Covid.  And everybody else in the house got the same thing.  Her grandson who is speaking with today tested negative for Covid.  They never got the patient tested though she has had symptoms for 3 weeks at this point.  She has some minimal shortness of breath only with coughing.  Cough is somewhat productive.  No fever.  No taste or smell disturbances.  She is vaccinated against  COVID-19.  Have not been able to use any decongestants given the patient's cardiac issues.  Allergic rhinitis: This is a chronic issue.  She has chronic intermittent cough and drainage for this.  Flonase did help with that.  Hypothyroidism: She is due to have her TSH rechecked.  Continues on Synthroid.   ROS: See pertinent positives and negatives per HPI.  Past Medical History:  Diagnosis Date  . A-fib ([place])   . Acid reflux disease   . Arrhythmia   . Atrial fibrillation with RVR ([place]) 08/15/2018  . Chronic diastolic CHF (congestive heart failure) ([place])   . Compression fracture of lumbar vertebra ([place])   . Diverticulitis 01-2008   GI [place]  . Diverticulosis   . Esophageal stricture 2006  . Femoral bruit    Right  . Fibrocystic breast disease   . Hiatal hernia   . History of esophageal stricture 06/14/2004   2006 esophageal dilation Schatzki's ring   . History of skin cancer 12/29/2008   Basal cell cancer right glabella 07/10/2010 Dr. Evorn Gong, [facility] [place] , [place]   . Hypercholesterolemia    [place] study LDL goal = < 160  . Mitral regurgitation    a. 06/2014 EF 55-60%, elevated end-diastolic pressures, dilated LA at 4.3 cm, mildly dilated RA, severe mitral regurgitation, mild aortic sclerosis without stenosis, mod-severe TR  . Mitral valve prolapse   . Osteoporosis    Dr Matthew Saras  . Pancreatitis 07-2008   Hospitalized   . Patella fracture 05/13/2018  . Schatzki's ring   . Sepsis ([place]) 08/15/2018  . Skin cancer    facial x 2. [name]  Past Surgical History:  Procedure Laterality Date  . BILIARY STENT PLACEMENT  08/21/2018   Procedure: BILIARY STENT PLACEMENT;  Surgeon: [name], MD;  Location: [facility] ENDOSCOPY;  Service: Gastroenterology;;  . COLONOSCOPY  2006  . Epidural steroids     x1  . ERCP N/A 08/21/2018   Procedure: ENDOSCOPIC RETROGRADE CHOLANGIOPANCREATOGRAPHY (ERCP);  Surgeon: [name], MD;  Location: [place];  Service: Gastroenterology;   Laterality: N/A;  . ESOPHAGEAL DILATION  2006  . ESOPHAGOGASTRODUODENOSCOPY (EGD) WITH PROPOFOL N/A 11/16/2018   Procedure: ESOPHAGOGASTRODUODENOSCOPY (EGD) WITH PROPOFOL;  Surgeon: [name], MD;  Location: [place];  Service: Gastroenterology;  Laterality: N/A;  . G3 P4 (twins)    . IR CHOLANGIOGRAM EXISTING TUBE  08/18/2018  . IR EXCHANGE BILIARY DRAIN  11/03/2018  . IR EXCHANGE BILIARY DRAIN  12/01/2018  . IR EXCHANGE BILIARY DRAIN  05/13/2019  . IR EXCHANGE BILIARY DRAIN  06/14/2019  . IR EXCHANGE BILIARY DRAIN  07/19/2019  . IR EXCHANGE BILIARY DRAIN  08/17/2019  . IR EXCHANGE BILIARY DRAIN  09/30/2019  . IR EXCHANGE BILIARY DRAIN  12/08/2019  . IR PERC CHOLECYSTOSTOMY  08/16/2018  . REMOVAL OF STONES  08/21/2018   Procedure: REMOVAL OF STONES;  Surgeon: [name], MD;  Location: [place];  Service: Gastroenterology;;  . SKIN CANCER EXCISION    . SPHINCTEROTOMY  08/21/2018   Procedure: SPHINCTEROTOMY;  Surgeon: [name], MD;  Location: [facility] ENDOSCOPY;  Service: Gastroenterology;;    Family History  Problem Relation Age of Onset  . Breast cancer Maternal Aunt   . Hepatitis Daughter        C  . Lung disease Mother        lung tumor  . Heart attack Daughter 52       S/P stents  . Liver cancer Daughter   . Heart attack Sister 20  . Diabetes Neg Hx   . Stroke Neg Hx     SOCIAL HX: Former smoker   Current Outpatient Medications:  .  amiodarone (PACERONE) 200 MG tablet, Take 1 tablet (200 mg total) by mouth daily., Disp: 90 tablet, Rfl: 1 .  apixaban (ELIQUIS) 2.5 MG TABS tablet, Take 1 tablet (2.5 mg total) by mouth 2 (two) times daily., Disp: 180 tablet, Rfl: 1 .  cholecalciferol (VITAMIN D3) 25 MCG (1000 UNIT) tablet, Take 1,000 Units by mouth daily., Disp: , Rfl:  .  ferrous sulfate 325 (65 FE) MG EC tablet, Take 325 mg by mouth daily., Disp: , Rfl:  .  fluticasone (FLONASE) 50 MCG/ACT nasal spray, Place 2 sprays into both nostrils daily., Disp: 16 g, Rfl:  6 .  levothyroxine (SYNTHROID) 25 MCG tablet, Take 1 tablet (25 mcg total) by mouth daily., Disp: 90 tablet, Rfl: 1 .  magnesium chloride (SLOW-MAG) 64 MG TBEC SR tablet, Take 2 tablets (128 mg total) by mouth daily., Disp: 60 tablet, Rfl: 3 .  metoprolol tartrate (LOPRESSOR) 25 MG tablet, Take 1 tablet (25 mg total) by mouth 2 (two) times daily., Disp: 180 tablet, Rfl: 1 .  omeprazole (PRILOSEC) 40 MG capsule, Take 1 capsule (40 mg total) by mouth daily., Disp: 90 capsule, Rfl: 1 .  rosuvastatin (CRESTOR) 20 MG tablet, Take 1 tablet (20 mg total) by mouth at bedtime., Disp: 14 tablet, Rfl: 0  EXAM: This is a telephone visit and thus no physical exam was completed.  ASSESSMENT AND PLAN:  Discussed the following assessment and plan:  Allergic rhinitis Patient with chronic allergy symptoms that recently  had a likely upper respiratory infection that is progressively improving.  Discussed that they could use plain Mucinex over-the-counter to help with any congestion.  Advised to monitor for any worsening symptoms and let us know if they occur.  She can continue Flonase.  Hypothyroidism She will come in for TSH check.  Continue Synthroid.   No orders of the defined types were placed in this encounter.   No orders of the defined types were placed in this encounter.    I discussed the assessment and treatment plan with the patient. The patient was provided an opportunity to ask questions and all were answered. The patient agreed with the plan and demonstrated an understanding of the instructions.   The patient was advised to call back or seek an in-person evaluation if the symptoms worsen or if the condition fails to improve as anticipated.  I provided 12 minutes of non-face-to-face time during this encounter.   Tommi Rumps, MD

## 2020-01-04 NOTE — Assessment & Plan Note (Signed)
Patient with chronic allergy symptoms that recently had a likely upper respiratory infection that is progressively improving.  Discussed that they could use plain Mucinex over-the-counter to help with any congestion.  Advised to monitor for any worsening symptoms and let us know if they occur.  She can continue Flonase.

## 2020-01-04 NOTE — Assessment & Plan Note (Signed)
She will come in for TSH check.  Continue Synthroid.

## 2020-01-10 NOTE — Telephone Encounter (Signed)
Spoke with Kylie May and issues have been resolved and patient is coming in tomorrow at 2:30 pm for labs

## 2020-01-11 ENCOUNTER — Other Ambulatory Visit: Payer: Self-pay

## 2020-01-11 ENCOUNTER — Other Ambulatory Visit (INDEPENDENT_AMBULATORY_CARE_PROVIDER_SITE_OTHER): Payer: Medicare Other

## 2020-01-11 DIAGNOSIS — E039 Hypothyroidism, unspecified: Secondary | ICD-10-CM | POA: Diagnosis not present

## 2020-01-12 LAB — TSH: TSH: 8.84 u[IU]/mL — ABNORMAL HIGH (ref 0.35–4.50)

## 2020-01-13 ENCOUNTER — Other Ambulatory Visit: Payer: Self-pay

## 2020-01-13 DIAGNOSIS — E039 Hypothyroidism, unspecified: Secondary | ICD-10-CM

## 2020-01-13 DIAGNOSIS — C44622 Squamous cell carcinoma of skin of right upper limb, including shoulder: Secondary | ICD-10-CM | POA: Diagnosis not present

## 2020-01-13 DIAGNOSIS — D485 Neoplasm of uncertain behavior of skin: Secondary | ICD-10-CM | POA: Diagnosis not present

## 2020-01-13 MED ORDER — LEVOTHYROXINE SODIUM 75 MCG PO TABS: 37.5000 ug | ORAL_TABLET | Freq: Every day | ORAL | 1 refills | Status: DC

## 2020-01-18 ENCOUNTER — Other Ambulatory Visit (HOSPITAL_COMMUNITY): Payer: Self-pay | Admitting: Interventional Radiology

## 2020-01-18 MED FILL — NORMAL SALINE FLUSH SYRINGE: 0.9 | 60 days supply | Qty: 600 | Fill #0

## 2020-01-19 ENCOUNTER — Other Ambulatory Visit: Payer: Self-pay

## 2020-01-19 ENCOUNTER — Other Ambulatory Visit (HOSPITAL_COMMUNITY): Payer: Self-pay | Admitting: Interventional Radiology

## 2020-01-19 ENCOUNTER — Ambulatory Visit (HOSPITAL_COMMUNITY)
Admission: RE | Admit: 2020-01-19 | Discharge: 2020-01-19 | Disposition: A | Discharge status: Home / Self Care | Payer: Medicare Other | Source: Ambulatory Visit | Attending: Interventional Radiology | Admitting: Interventional Radiology

## 2020-01-19 DIAGNOSIS — Z434 Encounter for attention to other artificial openings of digestive tract: Secondary | ICD-10-CM | POA: Diagnosis not present

## 2020-01-19 DIAGNOSIS — K8045 Calculus of bile duct with chronic cholecystitis with obstruction: Secondary | ICD-10-CM | POA: Insufficient documentation

## 2020-01-19 DIAGNOSIS — Z4803 Encounter for change or removal of drains: Secondary | ICD-10-CM | POA: Diagnosis not present

## 2020-01-19 HISTORY — PX: IR EXCHANGE BILIARY DRAIN: IMG6046

## 2020-01-19 MED ORDER — LIDOCAINE HCL 1 % IJ SOLN
INTRAMUSCULAR | Status: AC
  Filled 2020-01-19: qty 20

## 2020-01-19 MED ORDER — LIDOCAINE HCL (PF) 1 % IJ SOLN
INTRAMUSCULAR | Status: AC | PRN
  Administered 2020-01-19: 10 mL

## 2020-01-19 MED ORDER — IOHEXOL 300 MG/ML  SOLN
50.0000 mL | Freq: Once | INTRAMUSCULAR | Status: AC | PRN
  Administered 2020-01-19: 5 mL

## 2020-01-19 NOTE — Procedures (Signed)
Interventional Radiology Procedure Note  Procedure: fluoro Chole tube exchg    Complications: None  Estimated Blood Loss:  none  Findings: 12 fr tube exchg'd    Tamera Punt, MD

## 2020-02-03 DIAGNOSIS — M5416 Radiculopathy, lumbar region: Secondary | ICD-10-CM | POA: Diagnosis not present

## 2020-02-03 DIAGNOSIS — I700 Atherosclerosis of aorta: Secondary | ICD-10-CM | POA: Diagnosis not present

## 2020-02-03 DIAGNOSIS — M4186 Other forms of scoliosis, lumbar region: Secondary | ICD-10-CM | POA: Diagnosis not present

## 2020-02-03 DIAGNOSIS — M419 Scoliosis, unspecified: Secondary | ICD-10-CM | POA: Diagnosis not present

## 2020-02-03 DIAGNOSIS — M5136 Other intervertebral disc degeneration, lumbar region: Secondary | ICD-10-CM | POA: Diagnosis not present

## 2020-02-03 DIAGNOSIS — M5116 Intervertebral disc disorders with radiculopathy, lumbar region: Secondary | ICD-10-CM | POA: Diagnosis not present

## 2020-02-03 DIAGNOSIS — M4726 Other spondylosis with radiculopathy, lumbar region: Secondary | ICD-10-CM | POA: Diagnosis not present

## 2020-02-15 ENCOUNTER — Other Ambulatory Visit: Payer: Self-pay | Admitting: Family

## 2020-02-16 DIAGNOSIS — C44529 Squamous cell carcinoma of skin of other part of trunk: Secondary | ICD-10-CM | POA: Diagnosis not present

## 2020-02-17 ENCOUNTER — Other Ambulatory Visit: Payer: Self-pay | Admitting: Cardiovascular Disease

## 2020-02-17 MED ORDER — AMIODARONE HCL 200 MG PO TABS
200.0000 mg | ORAL_TABLET | Freq: Every day | ORAL | 0 refills | Status: DC
Start: 2020-02-17 — End: 2020-03-22

## 2020-02-17 MED ORDER — ROSUVASTATIN CALCIUM 20 MG PO TABS
20.0000 mg | ORAL_TABLET | Freq: Every day | ORAL | 0 refills | Status: DC
Start: 2020-02-17 — End: 2020-03-22

## 2020-02-17 MED ORDER — METOPROLOL TARTRATE 25 MG PO TABS
25.0000 mg | ORAL_TABLET | Freq: Two times a day (BID) | ORAL | 0 refills | Status: DC
Start: 2020-02-17 — End: 2020-03-22

## 2020-02-17 NOTE — Telephone Encounter (Signed)
*  STAT* If patient is at the pharmacy, call can be transferred to refill team.   1. Which medications need to be refilled? (please list name of each medication and dose if known) all cardiac medications (refills are not being made and patient is unsure what is up to date)  2. Which pharmacy/location (including street and city if local pharmacy) is medication to be sent to? [facility]  3. Do they need a 30 day or 90 day supply? 90, with refills is possible  Patient is scheduled to come in for visit in November

## 2020-02-17 NOTE — Telephone Encounter (Signed)
Requested Prescriptions   Pending Prescriptions Disp Refills   apixaban (ELIQUIS) 2.5 MG TABS tablet 180 tablet 0    Sig: Take 1 tablet (2.5 mg total) by mouth 2 (two) times daily.   Signed Prescriptions Disp Refills   amiodarone (PACERONE) 200 MG tablet 90 tablet 0    Sig: Take 1 tablet (200 mg total) by mouth daily.    Authorizing Provider: Minna Merritts    Ordering User: Othelia Pulling C   metoprolol tartrate (LOPRESSOR) 25 MG tablet 180 tablet 0    Sig: Take 1 tablet (25 mg total) by mouth 2 (two) times daily.    Authorizing Provider: Minna Merritts    Ordering User: Othelia Pulling C   rosuvastatin (CRESTOR) 20 MG tablet 90 tablet 0    Sig: Take 1 tablet (20 mg total) by mouth at bedtime.    Authorizing Provider: Minna Merritts    Ordering User: Britt Bottom

## 2020-02-18 MED ORDER — APIXABAN 2.5 MG PO TABS
2.5000 mg | ORAL_TABLET | Freq: Two times a day (BID) | ORAL | 1 refills | Status: DC
Start: 2020-02-18 — End: 2020-03-22

## 2020-02-18 NOTE — Telephone Encounter (Signed)
Prescription refill request for Eliquis received.  Last office visit: [place], 10/12/2019 Scr: 0.91, 09/17/2019 Age: 86 y.o. Weight: 47.6 kg   Prescription refill sent.

## 2020-02-23 DIAGNOSIS — M48062 Spinal stenosis, lumbar region with neurogenic claudication: Secondary | ICD-10-CM | POA: Diagnosis not present

## 2020-02-23 DIAGNOSIS — M5136 Other intervertebral disc degeneration, lumbar region: Secondary | ICD-10-CM | POA: Diagnosis not present

## 2020-02-23 DIAGNOSIS — M5416 Radiculopathy, lumbar region: Secondary | ICD-10-CM | POA: Diagnosis not present

## 2020-02-26 ENCOUNTER — Other Ambulatory Visit: Payer: Self-pay | Admitting: Family Medicine

## 2020-02-26 DIAGNOSIS — M545 Low back pain: Secondary | ICD-10-CM | POA: Diagnosis not present

## 2020-02-26 DIAGNOSIS — E039 Hypothyroidism, unspecified: Secondary | ICD-10-CM

## 2020-02-29 ENCOUNTER — Other Ambulatory Visit (HOSPITAL_COMMUNITY): Payer: Self-pay | Admitting: Interventional Radiology

## 2020-02-29 DIAGNOSIS — K819 Cholecystitis, unspecified: Secondary | ICD-10-CM

## 2020-03-01 ENCOUNTER — Other Ambulatory Visit: Payer: Self-pay

## 2020-03-01 ENCOUNTER — Ambulatory Visit (HOSPITAL_COMMUNITY)
Admission: RE | Admit: 2020-03-01 | Discharge: 2020-03-01 | Disposition: A | Discharge status: Home / Self Care | Payer: Medicare Other | Source: Ambulatory Visit | Attending: Interventional Radiology | Admitting: Interventional Radiology

## 2020-03-01 ENCOUNTER — Other Ambulatory Visit (HOSPITAL_COMMUNITY): Payer: Self-pay | Admitting: Interventional Radiology

## 2020-03-01 DIAGNOSIS — Z434 Encounter for attention to other artificial openings of digestive tract: Secondary | ICD-10-CM | POA: Diagnosis not present

## 2020-03-01 DIAGNOSIS — K819 Cholecystitis, unspecified: Secondary | ICD-10-CM

## 2020-03-01 DIAGNOSIS — Z4803 Encounter for change or removal of drains: Secondary | ICD-10-CM | POA: Diagnosis not present

## 2020-03-01 HISTORY — PX: IR EXCHANGE BILIARY DRAIN: IMG6046

## 2020-03-01 MED ORDER — IOHEXOL 300 MG/ML  SOLN
50.0000 mL | Freq: Once | INTRAMUSCULAR | Status: AC | PRN
  Administered 2020-03-01: 10 mL

## 2020-03-01 MED ORDER — LIDOCAINE HCL 1 % IJ SOLN
INTRAMUSCULAR | Status: AC
  Filled 2020-03-01: qty 20

## 2020-03-01 MED ORDER — LIDOCAINE HCL 1 % IJ SOLN
INTRAMUSCULAR | Status: DC | PRN
  Administered 2020-03-01: 20 mL via INTRADERMAL

## 2020-03-01 NOTE — Procedures (Signed)
Interventional Radiology Procedure Note  Procedure: Cholecystostomy tube check/change  Findings: Please refer to procedural dictation for full description. Indwelling drain in good position and patent.  Exchanged for similar drain, 10.2 Fr.   Complications: None.  Estimated Blood Loss: None.  Recommendations: Keep to bag drainage. Return in 8-12 weeks for routine check and change.   Ruthann Cancer, MD Pager: [phone]

## 2020-03-07 DIAGNOSIS — M4856XD Collapsed vertebra, not elsewhere classified, lumbar region, subsequent encounter for fracture with routine healing: Secondary | ICD-10-CM | POA: Diagnosis not present

## 2020-03-07 DIAGNOSIS — Z23 Encounter for immunization: Secondary | ICD-10-CM | POA: Diagnosis not present

## 2020-03-15 ENCOUNTER — Other Ambulatory Visit (HOSPITAL_COMMUNITY): Payer: Medicare Other

## 2020-03-22 ENCOUNTER — Other Ambulatory Visit: Payer: Self-pay | Admitting: Cardiovascular Disease

## 2020-03-22 ENCOUNTER — Telehealth: Payer: Self-pay | Admitting: Family Medicine

## 2020-03-22 DIAGNOSIS — E039 Hypothyroidism, unspecified: Secondary | ICD-10-CM

## 2020-03-22 MED ORDER — APIXABAN 2.5 MG PO TABS: 2.5000 mg | ORAL_TABLET | Freq: Two times a day (BID) | ORAL | 1 refills | Status: DC

## 2020-03-22 MED ORDER — AMIODARONE HCL 200 MG PO TABS
200.0000 mg | ORAL_TABLET | Freq: Every day | ORAL | 0 refills | Status: DC
Start: 2020-03-22 — End: 2020-04-21

## 2020-03-22 MED ORDER — METOPROLOL TARTRATE 25 MG PO TABS
25.0000 mg | ORAL_TABLET | Freq: Two times a day (BID) | ORAL | 0 refills | Status: DC
Start: 2020-03-22 — End: 2020-04-21

## 2020-03-22 MED ORDER — OMEPRAZOLE 40 MG PO CPDR
40.0000 mg | DELAYED_RELEASE_CAPSULE | Freq: Every day | ORAL | 0 refills | Status: DC
Start: 2020-03-22 — End: 2020-04-21

## 2020-03-22 MED ORDER — ROSUVASTATIN CALCIUM 20 MG PO TABS
20.0000 mg | ORAL_TABLET | Freq: Every day | ORAL | 0 refills | Status: DC
Start: 2020-03-22 — End: 2020-04-21

## 2020-03-22 NOTE — Telephone Encounter (Signed)
*  STAT* If patient is at the pharmacy, call can be transferred to refill team.   1. Which medications need to be refilled? (please list name of each medication and dose if known)   Metoprolol 25 mg po BID  Amiodarone 200 mg po q d  Rosuvastatin 20 mg po q hs Eliquis 2.5 mg po BID. Omeprazole 40 mg Po q d    2. Which pharmacy/location (including street and city if local pharmacy) is medication to be sent [facility]   3. Do they need a 30 day or 90 day supply? 90 or for a year if ok

## 2020-03-22 NOTE — Telephone Encounter (Signed)
Patient called in for refill for levothyroxine (SYNTHROID) 75 MCG tablet she [facility] to [facility]

## 2020-03-23 MED ORDER — LEVOTHYROXINE SODIUM 75 MCG PO TABS: 37.5000 ug | ORAL_TABLET | Freq: Every day | ORAL | 1 refills | Status: DC

## 2020-03-23 MED FILL — NORMAL SALINE FLUSH SYRINGE: 0.9 | 60 days supply | Qty: 600 | Fill #1

## 2020-04-04 DIAGNOSIS — M4856XD Collapsed vertebra, not elsewhere classified, lumbar region, subsequent encounter for fracture with routine healing: Secondary | ICD-10-CM | POA: Diagnosis not present

## 2020-04-19 ENCOUNTER — Telehealth (HOSPITAL_COMMUNITY): Payer: Self-pay | Admitting: Radiology

## 2020-04-19 DIAGNOSIS — M4856XD Collapsed vertebra, not elsewhere classified, lumbar region, subsequent encounter for fracture with routine healing: Secondary | ICD-10-CM | POA: Diagnosis not present

## 2020-04-19 DIAGNOSIS — M4856XA Collapsed vertebra, not elsewhere classified, lumbar region, initial encounter for fracture: Secondary | ICD-10-CM | POA: Diagnosis not present

## 2020-04-19 DIAGNOSIS — M546 Pain in thoracic spine: Secondary | ICD-10-CM | POA: Diagnosis not present

## 2020-04-19 DIAGNOSIS — M47816 Spondylosis without myelopathy or radiculopathy, lumbar region: Secondary | ICD-10-CM | POA: Diagnosis not present

## 2020-04-19 DIAGNOSIS — M47817 Spondylosis without myelopathy or radiculopathy, lumbosacral region: Secondary | ICD-10-CM | POA: Diagnosis not present

## 2020-04-19 NOTE — Telephone Encounter (Signed)
Pt's grandson Hulen Skains) called and wants to move pt's appt up due to her drain being pulled out some and not draining very much. Called [facility] to reschedule her at an earlier date, no answer and VM is full. JM

## 2020-04-20 ENCOUNTER — Ambulatory Visit (HOSPITAL_COMMUNITY)
Admission: RE | Admit: 2020-04-20 | Discharge: 2020-04-20 | Disposition: A | Discharge status: Home / Self Care | Payer: Medicare Other | Source: Ambulatory Visit | Attending: Interventional Radiology | Admitting: Interventional Radiology

## 2020-04-20 ENCOUNTER — Telehealth: Payer: Self-pay | Admitting: Cardiovascular Disease

## 2020-04-20 ENCOUNTER — Other Ambulatory Visit: Payer: Self-pay

## 2020-04-20 DIAGNOSIS — Z4803 Encounter for change or removal of drains: Secondary | ICD-10-CM | POA: Insufficient documentation

## 2020-04-20 DIAGNOSIS — K8045 Calculus of bile duct with chronic cholecystitis with obstruction: Secondary | ICD-10-CM | POA: Diagnosis not present

## 2020-04-20 DIAGNOSIS — Z436 Encounter for attention to other artificial openings of urinary tract: Secondary | ICD-10-CM | POA: Diagnosis not present

## 2020-04-20 HISTORY — PX: IR EXCHANGE BILIARY DRAIN: IMG6046

## 2020-04-20 MED ORDER — LIDOCAINE HCL 1 % IJ SOLN
INTRAMUSCULAR | Status: AC
  Filled 2020-04-20: qty 20

## 2020-04-20 MED ORDER — IOHEXOL 300 MG/ML  SOLN
50.0000 mL | Freq: Once | INTRAMUSCULAR | Status: AC | PRN
  Administered 2020-04-20: 10 mL

## 2020-04-20 NOTE — Progress Notes (Signed)
Date:  04/21/2020   ID:  Kylie May, DOB [birth year], MRN [id number]  Patient Location:  [place] [address]   Provider location:   [facility], [place] office  PCP:  [name], MD  Cardiologist:  [name] [facility]   Chief Complaint  Patient presents with  . office visit    6 month F/U; Meds verbally reviewed with patient's grandson.    History of Present Illness:    Kylie May is a 86 y.o. female  past medical history of paroxysmal asymptomatic a-fib,  HTN,  hiatal hernia,  osteopenia,  degenerative joint disease,  Scoliosis Long history of falls, 2018 prior fall with pelvic fracture admitted to [facility] 1/7-1/10/16 for new onset a-fibAndmild diastolic CHF, who presents today for follow-up of her atrial fibrillation,aortic atherosclerosis  very hard of hearing,  Family present   Last seen in clinic May 2021 Very hard of hearing, family presents with her She was not taking Lasix at the time She had biliary drain, family did not want to take it out Appetite was stable, loose bowel movements, no arrhythmia At that time was taking metoprolol tartrate 25 twice daily with amiodarone 200 Had trace ankle swelling  8 weeks ago, "exacerbation of back pain" Was on couch in pain Bilateral L4-5 transforaminal epidural injections under fluoroscopic guidance. Seeing orthopedics, Dr. Sharyne Richters Possible compression fracture MRI ordered  Drain is still in place, family prefers to keep it in place for now "tube changed"  Family bought a new chairs, leg elevation Swelling down, "normal"  Weight today 99 pounds, with dress/shoes Down from 105 in 10/2019  Other past medical history reviewed  from 2020, severe epigastric pain from 08/15/2018-08/26/2018 CT of her abdomen and pelvis which showed intra-and extrahepatic ductal dilatation and gallbladder wall thickening but no stones  transferred to [facility] for ERCP. She underwent cholecystostomy tube placement by interventional radiology and subsequently underwent ERCP which showed dilatation of the bile duct and cholelithiasis. She had the stone removed and had a biliary sphincterectomy performed and a plastic stent was placed in the pancreatic duct  A. fib with RVR complicated by decompensated heart failure with fluid retention.  She was on anticoagulation with apixaban amiodarone metoprolol placed on oral Lasix  Suffered severe calorie protein malnutrition Lower extremity edema, anemia On last clinic visit recommended to decrease amiodarone dosing, continue metoprolol, on anticoagulation, continue Lasix as needed for leg swelling -She had a cholecystostomy bag.,  Biliary drain was in place Scheduled to see IR on last visit   Past Medical History:  Diagnosis Date  . A-fib ([place])   . Acid reflux disease   . Arrhythmia   . Atrial fibrillation with RVR ([place]) 08/15/2018  . Chronic diastolic CHF (congestive heart failure) ([place])   . Compression fracture of lumbar vertebra ([place])   . Diverticulitis 01-2008   [facility] [place]  . Diverticulosis   . Esophageal stricture 2006  . Femoral bruit    Right  . Fibrocystic breast disease   . Hiatal hernia   . History of esophageal stricture 06/14/2004   2006 esophageal dilation Schatzki's ring   . History of skin cancer 12/29/2008   Basal cell cancer right glabella 07/10/2010 Dr. Evorn Gong, [facility] [place] [facility] [place]   . Hypercholesterolemia    [place] study LDL goal = < 160  . Mitral regurgitation    a. 06/2014 EF 55-60%, elevated end-diastolic pressures, dilated LA at 4.3  cm, mildly dilated RA, severe mitral regurgitation, mild aortic sclerosis without stenosis, mod-severe TR  . Mitral valve prolapse   . Osteoporosis    Dr Matthew Saras  . Pancreatitis 07-2008   Hospitalized   . Patella fracture 05/13/2018  . Schatzki's ring   . Sepsis ([place]) 08/15/2018  . Skin cancer    facial x 2. [name]    Past Surgical History:  Procedure Laterality Date  . BILIARY STENT PLACEMENT  08/21/2018   Procedure: BILIARY STENT PLACEMENT;  Surgeon: [name], MD;  Location: [facility] ENDOSCOPY;  Service: Gastroenterology;;  . COLONOSCOPY  2006  . Epidural steroids     x1  . ERCP N/A 08/21/2018   Procedure: ENDOSCOPIC RETROGRADE CHOLANGIOPANCREATOGRAPHY (ERCP);  Surgeon: [name], MD;  Location: [place];  Service: Gastroenterology;  Laterality: N/A;  . ESOPHAGEAL DILATION  2006  . ESOPHAGOGASTRODUODENOSCOPY (EGD) WITH PROPOFOL N/A 11/16/2018   Procedure: ESOPHAGOGASTRODUODENOSCOPY (EGD) WITH PROPOFOL;  Surgeon: [name], MD;  Location: [place];  Service: Gastroenterology;  Laterality: N/A;  . G3 P4 (twins)    . IR CHOLANGIOGRAM EXISTING TUBE  08/18/2018  . IR EXCHANGE BILIARY DRAIN  11/03/2018  . IR EXCHANGE BILIARY DRAIN  12/01/2018  . IR EXCHANGE BILIARY DRAIN  05/13/2019  . IR EXCHANGE BILIARY DRAIN  06/14/2019  . IR EXCHANGE BILIARY DRAIN  07/19/2019  . IR EXCHANGE BILIARY DRAIN  08/17/2019  . IR EXCHANGE BILIARY DRAIN  09/30/2019  . IR EXCHANGE BILIARY DRAIN  12/08/2019  . IR EXCHANGE BILIARY DRAIN  01/19/2020  . IR EXCHANGE BILIARY DRAIN  03/01/2020  . IR EXCHANGE BILIARY DRAIN  04/20/2020  . IR PERC CHOLECYSTOSTOMY  08/16/2018  . REMOVAL OF STONES  08/21/2018   Procedure: REMOVAL OF STONES;  Surgeon: [name], MD;  Location: [place];  Service: Gastroenterology;;  . SKIN CANCER EXCISION    . SPHINCTEROTOMY  08/21/2018   Procedure: SPHINCTEROTOMY;  Surgeon: [name], MD;  Location: [facility] ENDOSCOPY;  Service: Gastroenterology;;     Current Meds  Medication Sig  . amiodarone (PACERONE) 200 MG tablet Take 1 tablet (200 mg total) by mouth daily.  [name] apixaban (ELIQUIS) 2.5 MG TABS tablet Take 1 tablet (2.5 mg total) by mouth 2 (two) times daily.  . cholecalciferol (VITAMIN D3) 25 MCG (1000 UNIT) tablet Take 1,000 Units by mouth daily.  . ferrous sulfate 325 (65 FE) MG EC  tablet Take 325 mg by mouth daily.  . fluticasone (FLONASE) 50 MCG/ACT nasal spray Place 2 sprays into both nostrils daily.  [name] levothyroxine (SYNTHROID) 75 MCG tablet Take 0.5 tablets (37.5 mcg total) by mouth daily.  . magnesium chloride (SLOW-MAG) 64 MG TBEC SR tablet Take 2 tablets (128 mg total) by mouth daily.  . metoprolol tartrate (LOPRESSOR) 25 MG tablet Take 1 tablet (25 mg total) by mouth 2 (two) times daily.  [name] omeprazole (PRILOSEC) 40 MG capsule Take 1 capsule (40 mg total) by mouth daily.  . rosuvastatin (CRESTOR) 20 MG tablet Take 1 tablet (20 mg total) by mouth at bedtime.  . traMADol (ULTRAM) 50 MG tablet Take 50 mg by mouth as needed.     Allergies:   Metronidazole and Penicillin g   Social History   Tobacco Use  . Smoking status: Former Smoker    Quit date: 06/04/1951    Years since quitting: 68.9  . Smokeless tobacco: Never Used  . Tobacco comment: Smoked 1945-1953 , up to 2 cigarettes/day  Vaping Use  . Vaping Use: Never used  Substance  Use Topics  . Alcohol use: Yes    Alcohol/week: 1.0 standard drink    Types: 1 Glasses of wine per week    Comment: occassional  . Drug use: No     Family Hx: The patient's family history includes Breast cancer in her maternal aunt; Heart attack (age of onset: 55) in her daughter; Heart attack (age of onset: 34) in her sister; Hepatitis in her daughter; Liver cancer in her daughter; Lung disease in her mother. There is no history of Diabetes or Stroke.  ROS:   Please see the history of present illness.    Review of Systems  Constitutional: Positive for malaise/fatigue.  HENT: Negative.   Respiratory: Negative.   Cardiovascular: Negative.   Gastrointestinal: Negative.   Musculoskeletal: Positive for back pain.  Neurological: Negative.   Psychiatric/Behavioral: Negative.   All other systems reviewed and are negative.    Labs/Other Tests and Data Reviewed:    Recent Labs: 09/17/2019: ALT 77; BUN 10; Creat 0.91;  Hemoglobin 13.4; Platelets 239; Potassium 4.4; Sodium 132 01/11/2020: TSH 8.84   Recent Lipid Panel Lab Results  Component Value Date/Time   CHOL 127 09/17/2019 03:15 PM   TRIG 78 09/17/2019 03:15 PM   HDL 64 09/17/2019 03:15 PM   CHOLHDL 2.0 09/17/2019 03:15 PM   LDLCALC 47 09/17/2019 03:15 PM   LDLDIRECT 121.4 06/17/2011 11:46 AM    Wt Readings from Last 3 Encounters:  04/21/20 99 lb (44.9 kg)  01/04/20 105 lb (47.6 kg)  10/12/19 105 lb 4 oz (47.7 kg)     Exam:    BP 137/68 (BP Location: Right Arm, Patient Position: Sitting)   Pulse (!) 58   Ht 4\' 11"  (1.499 m)   Wt 99 lb (44.9 kg)   SpO2 99%   BMI 20.00 kg/m   Constitutional:  oriented to person, place, and time. No distress.  Hard of hearing   ASSESSMENT & PLAN:    Paroxysmal atrial fibrillation (HCC) on metoprolol 25 twice daily, amiodarone 200 daily Likely maintaining normal sinus rhythm  Chronic diastolic CHF (congestive heart failure) (HCC) Minimal ankle swelling Sits with her legs up and new chair/recliner Lasix as needed  Abdominal aortic atherosclerosis ([place]) No further work-up needed given age  Pure hypercholesterolemia Cholesterol well controlled, no changes made  Severe protein-calorie malnutrition ([place]) Weight dramatically low, stressed importance of increasing carbohydrate intake     Total encounter time more than 25 minutes  Greater than 50% was spent in counseling and coordination of care with the patient    Signed, Ida Rogue, MD  04/21/2020 2:23 PM    [place] [address], [place], [place] [address]

## 2020-04-20 NOTE — Telephone Encounter (Signed)
  Patient Consent for Virtual Visit         Kylie May has provided verbal consent on 04/20/2020 for a virtual visit (video or telephone).   CONSENT FOR VIRTUAL VISIT FOR:  Kylie May  By participating in this virtual visit I agree to the following:  I hereby voluntarily request, consent and authorize [facility] and its employed or contracted physicians, physician assistants, nurse practitioners or other licensed health care professionals (the Practitioner), to provide me with telemedicine health care services (the "Services") as deemed necessary by the treating Practitioner. I acknowledge and consent to receive the Services by the Practitioner via telemedicine. I understand that the telemedicine visit will involve communicating with the Practitioner through live audiovisual communication technology and the disclosure of certain medical information by electronic transmission. I acknowledge that I have been given the opportunity to request an in-person assessment or other available alternative prior to the telemedicine visit and am voluntarily participating in the telemedicine visit.  I understand that I have the right to withhold or withdraw my consent to the use of telemedicine in the course of my care at any time, without affecting my right to future care or treatment, and that the Practitioner or I may terminate the telemedicine visit at any time. I understand that I have the right to inspect all information obtained and/or recorded in the course of the telemedicine visit and may receive copies of available information for a reasonable fee.  I understand that some of the potential risks of receiving the Services via telemedicine include:  [name] Delay or interruption in medical evaluation due to technological equipment failure or disruption; . Information transmitted may not be sufficient (e.g. poor resolution of images) to allow for appropriate medical decision making by the  Practitioner; and/or  . In rare instances, security protocols could fail, causing a breach of personal health information.  Furthermore, I acknowledge that it is my responsibility to provide information about my medical history, conditions and care that is complete and accurate to the best of my ability. I acknowledge that Practitioner's advice, recommendations, and/or decision may be based on factors not within their control, such as incomplete or inaccurate data provided by me or distortions of diagnostic images or specimens that may result from electronic transmissions. I understand that the practice of medicine is not an exact science and that Practitioner makes no warranties or guarantees regarding treatment outcomes. I acknowledge that a copy of this consent can be made available to me via my patient portal ([facility]), or I can request a printed copy by calling the office of [facility].    I understand that my insurance will be billed for this visit.   I have read or had this consent read to me. . I understand the contents of this consent, which adequately explains the benefits and risks of the Services being provided via telemedicine.  . I have been provided ample opportunity to ask questions regarding this consent and the Services and have had my questions answered to my satisfaction. . I give my informed consent for the services to be provided through the use of telemedicine in my medical care

## 2020-04-21 ENCOUNTER — Encounter: Payer: Self-pay | Admitting: Cardiovascular Disease

## 2020-04-21 ENCOUNTER — Telehealth (INDEPENDENT_AMBULATORY_CARE_PROVIDER_SITE_OTHER): Payer: Medicare Other | Admitting: Cardiovascular Disease

## 2020-04-21 VITALS — BP 137/68 | HR 58 | Ht 59.0 in | Wt 99.0 lb

## 2020-04-21 DIAGNOSIS — E7800 Pure hypercholesterolemia, unspecified: Secondary | ICD-10-CM

## 2020-04-21 DIAGNOSIS — I480 Paroxysmal atrial fibrillation: Secondary | ICD-10-CM | POA: Diagnosis not present

## 2020-04-21 DIAGNOSIS — I700 Atherosclerosis of aorta: Secondary | ICD-10-CM

## 2020-04-21 DIAGNOSIS — I5032 Chronic diastolic (congestive) heart failure: Secondary | ICD-10-CM

## 2020-04-21 DIAGNOSIS — M4856XD Collapsed vertebra, not elsewhere classified, lumbar region, subsequent encounter for fracture with routine healing: Secondary | ICD-10-CM | POA: Diagnosis not present

## 2020-04-21 DIAGNOSIS — E43 Unspecified severe protein-calorie malnutrition: Secondary | ICD-10-CM | POA: Diagnosis not present

## 2020-04-21 MED ORDER — METOPROLOL TARTRATE 25 MG PO TABS
25.0000 mg | ORAL_TABLET | Freq: Two times a day (BID) | ORAL | 3 refills | Status: DC
Start: 2020-04-21 — End: 2021-05-29

## 2020-04-21 MED ORDER — AMIODARONE HCL 200 MG PO TABS
200.0000 mg | ORAL_TABLET | Freq: Every day | ORAL | 3 refills | Status: DC
Start: 2020-04-21 — End: 2021-05-29

## 2020-04-21 MED ORDER — OMEPRAZOLE 40 MG PO CPDR
40.0000 mg | DELAYED_RELEASE_CAPSULE | Freq: Every day | ORAL | 3 refills | Status: DC
Start: 2020-04-21 — End: 2021-05-31

## 2020-04-21 MED ORDER — APIXABAN 2.5 MG PO TABS
2.5000 mg | ORAL_TABLET | Freq: Two times a day (BID) | ORAL | 3 refills | Status: DC
Start: 2020-04-21 — End: 2021-05-29

## 2020-04-21 MED ORDER — ROSUVASTATIN CALCIUM 20 MG PO TABS
20.0000 mg | ORAL_TABLET | Freq: Every day | ORAL | 3 refills | Status: DC
Start: 2020-04-21 — End: 2021-05-29

## 2020-04-21 NOTE — Patient Instructions (Addendum)
I have ref[facility]s [facility]  Medication Instructions:  No changes  If you need a refill on your cardiac medications before your next appointment, please call your pharmacy.    Lab work: No new labs needed   If you have labs (blood work) drawn today and your tests are completely normal, you will receive your results only by: [name] MyChart Message (if you have MyChart) OR . A paper copy in the mail If you have any lab test that is abnormal or we need to change your treatment, we will call you to review the results.   Testing/Procedures: No new testing needed   Follow-Up: At [facility], you and your health needs are our priority.  As part of our continuing mission to provide you with exceptional heart care, we have created designated Provider Care Teams.  These Care Teams include your primary Cardiologist (physician) and Advanced Practice Providers (APPs -  Physician Assistants and Nurse Practitioners) who all work together to provide you with the care you need, when you need it.  . You will need a follow up appointment in 6 months  . Providers on your designated Care Team:   . Murray Hodgkins, NP . Christell Faith, PA-C . Marrianne Mood, PA-C  Any Other Special Instructions Will Be Listed Below (If Applicable).  COVID-19 Vaccine Information can be found at: ShippingScam.co.uk For questions related to vaccine distribution or appointments, please email vaccine@[email or web address] .[email or web address] or call [phone].

## 2020-04-26 ENCOUNTER — Ambulatory Visit (HOSPITAL_COMMUNITY): Admission: RE | Admit: 2020-04-26 | Payer: Medicare Other | Source: Ambulatory Visit

## 2020-05-10 ENCOUNTER — Telehealth (INDEPENDENT_AMBULATORY_CARE_PROVIDER_SITE_OTHER): Payer: Medicare Other | Admitting: Family Medicine

## 2020-05-10 ENCOUNTER — Other Ambulatory Visit: Payer: Self-pay

## 2020-05-10 ENCOUNTER — Encounter: Payer: Self-pay | Admitting: Family Medicine

## 2020-05-10 DIAGNOSIS — I480 Paroxysmal atrial fibrillation: Secondary | ICD-10-CM

## 2020-05-10 DIAGNOSIS — E43 Unspecified severe protein-calorie malnutrition: Secondary | ICD-10-CM

## 2020-05-10 DIAGNOSIS — E039 Hypothyroidism, unspecified: Secondary | ICD-10-CM

## 2020-05-10 DIAGNOSIS — Z8719 Personal history of other diseases of the digestive system: Secondary | ICD-10-CM

## 2020-05-10 NOTE — Progress Notes (Signed)
Virtual Visit via video Note  This visit type was conducted due to national recommendations for restrictions regarding the COVID-19 pandemic (e.g. social distancing).  This format is felt to be most appropriate for this patient at this time.  All issues noted in this document were discussed and addressed.  No physical exam was performed (except for noted visual exam findings with Video Visits).   I connected with Kylie May today at  1:15 PM EST by a video enabled telemedicine application or telephone and verified that I am speaking with the correct person using two identifiers. Location patient: home Location provider: work  Persons participating in the virtual visit: patient, provider, Equities trader (grandson)  I discussed the limitations, risks, security and privacy concerns of performing an evaluation and management service by telephone and the availability of in person appointments. I also discussed with the patient that there may be a patient responsible charge related to this service. The patient expressed understanding and agreed to proceed.   Reason for visit: f/u.  HPI: HYPOTHYROIDISM Disease Monitoring Weight changes: weight has trended down  Skin Changes: no Palpitations: Rarely heat/Cold intolerance: no  Medication Monitoring Compliance:  Taking synthroid   Last TSH:   Lab Results  Component Value Date   TSH 8.84 (H) 01/11/2020   Atrial fibrillation: Patient notes rare palpitations.  Taking Eliquis, amiodarone, and metoprolol.  No bleeding issues.  She did recently see cardiology.  History of cholecystitis: She continues to have a drain in place.  Her grandson notes they have seen several surgeons and nobody wants to do a cholecystectomy on her as they do not want to take the risk.  He notes periodically they will have issues with the drain and they will have interventional radiology look at it.  He notes there is some slow flow over the last couple of days and he is  thinking he will need to contact interventional radiology.   ROS: See pertinent positives and negatives per HPI.  Past Medical History:  Diagnosis Date  . A-fib ([place])   . Acid reflux disease   . Arrhythmia   . Atrial fibrillation with RVR ([place]) 08/15/2018  . Chronic diastolic CHF (congestive heart failure) ([place])   . Compression fracture of lumbar vertebra ([place])   . Diverticulitis 01-2008   GI [place]  . Diverticulosis   . Esophageal stricture 2006  . Femoral bruit    Right  . Fibrocystic breast disease   . Hiatal hernia   . History of esophageal stricture 06/14/2004   2006 esophageal dilation Schatzki's ring   . History of skin cancer 12/29/2008   Basal cell cancer right glabella 07/10/2010 Dr. Evorn Gong, [facility] @ [place] , [place]   . Hypercholesterolemia    [place] study LDL goal = < 160  . Mitral regurgitation    a. 06/2014 EF 55-60%, elevated end-diastolic pressures, dilated LA at 4.3 cm, mildly dilated RA, severe mitral regurgitation, mild aortic sclerosis without stenosis, mod-severe TR  . Mitral valve prolapse   . Osteoporosis    Dr Matthew Saras  . Pancreatitis 07-2008   Hospitalized   . Patella fracture 05/13/2018  . Schatzki's ring   . Sepsis ([place]) 08/15/2018  . Skin cancer    facial x 2. [name]    Past Surgical History:  Procedure Laterality Date  . BILIARY STENT PLACEMENT  08/21/2018   Procedure: BILIARY STENT PLACEMENT;  Surgeon: [name], MD;  Location: [facility] ENDOSCOPY;  Service: Gastroenterology;;  . COLONOSCOPY  2006  . Epidural  steroids     x1  . ERCP N/A 08/21/2018   Procedure: ENDOSCOPIC RETROGRADE CHOLANGIOPANCREATOGRAPHY (ERCP);  Surgeon: [name], MD;  Location: [place];  Service: Gastroenterology;  Laterality: N/A;  . ESOPHAGEAL DILATION  2006  . ESOPHAGOGASTRODUODENOSCOPY (EGD) WITH PROPOFOL N/A 11/16/2018   Procedure: ESOPHAGOGASTRODUODENOSCOPY (EGD) WITH PROPOFOL;  Surgeon: [name], MD;  Location: [place];  Service:  Gastroenterology;  Laterality: N/A;  . G3 P4 (twins)    . IR CHOLANGIOGRAM EXISTING TUBE  08/18/2018  . IR EXCHANGE BILIARY DRAIN  11/03/2018  . IR EXCHANGE BILIARY DRAIN  12/01/2018  . IR EXCHANGE BILIARY DRAIN  05/13/2019  . IR EXCHANGE BILIARY DRAIN  06/14/2019  . IR EXCHANGE BILIARY DRAIN  07/19/2019  . IR EXCHANGE BILIARY DRAIN  08/17/2019  . IR EXCHANGE BILIARY DRAIN  09/30/2019  . IR EXCHANGE BILIARY DRAIN  12/08/2019  . IR EXCHANGE BILIARY DRAIN  01/19/2020  . IR EXCHANGE BILIARY DRAIN  03/01/2020  . IR EXCHANGE BILIARY DRAIN  04/20/2020  . IR PERC CHOLECYSTOSTOMY  08/16/2018  . REMOVAL OF STONES  08/21/2018   Procedure: REMOVAL OF STONES;  Surgeon: [name], MD;  Location: [place];  Service: Gastroenterology;;  . SKIN CANCER EXCISION    . SPHINCTEROTOMY  08/21/2018   Procedure: SPHINCTEROTOMY;  Surgeon: [name], MD;  Location: [facility] ENDOSCOPY;  Service: Gastroenterology;;    Family History  Problem Relation Age of Onset  . Breast cancer Maternal Aunt   . Hepatitis Daughter        C  . Lung disease Mother        lung tumor  . Heart attack Daughter 8       S/P stents  . Liver cancer Daughter   . Heart attack Sister 63  . Diabetes Neg Hx   . Stroke Neg Hx     SOCIAL HX: Former smoker   Current Outpatient Medications:  .  amiodarone (PACERONE) 200 MG tablet, Take 1 tablet (200 mg total) by mouth daily., Disp: 90 tablet, Rfl: 3 .  apixaban (ELIQUIS) 2.5 MG TABS tablet, Take 1 tablet (2.5 mg total) by mouth 2 (two) times daily., Disp: 180 tablet, Rfl: 3 .  cholecalciferol (VITAMIN D3) 25 MCG (1000 UNIT) tablet, Take 1,000 Units by mouth daily., Disp: , Rfl:  .  ferrous sulfate 325 (65 FE) MG EC tablet, Take 325 mg by mouth daily., Disp: , Rfl:  .  fluticasone (FLONASE) 50 MCG/ACT nasal spray, Place 2 sprays into both nostrils daily., Disp: 16 g, Rfl: 6 .  levothyroxine (SYNTHROID) 75 MCG tablet, Take 0.5 tablets (37.5 mcg total) by mouth daily., Disp: 45 tablet, Rfl: 1 .   magnesium chloride (SLOW-MAG) 64 MG TBEC SR tablet, Take 2 tablets (128 mg total) by mouth daily., Disp: 60 tablet, Rfl: 3 .  metoprolol tartrate (LOPRESSOR) 25 MG tablet, Take 1 tablet (25 mg total) by mouth 2 (two) times daily., Disp: 180 tablet, Rfl: 3 .  omeprazole (PRILOSEC) 40 MG capsule, Take 1 capsule (40 mg total) by mouth daily., Disp: 90 capsule, Rfl: 3 .  rosuvastatin (CRESTOR) 20 MG tablet, Take 1 tablet (20 mg total) by mouth at bedtime., Disp: 90 tablet, Rfl: 3 .  traMADol (ULTRAM) 50 MG tablet, Take 50 mg by mouth as needed. (Patient not taking: Reported on 05/10/2020), Disp: , Rfl:   EXAM:  VITALS per patient if applicable:  GENERAL: alert, oriented, appears well and in no acute distress  HEENT: atraumatic, conjunttiva clear, no obvious abnormalities  on inspection of external nose and ears  NECK: normal movements of the head and neck  LUNGS: on inspection no signs of respiratory distress, breathing rate appears normal, no obvious gross SOB, gasping or wheezing  CV: no obvious cyanosis  MS: moves all visible extremities without noticeable abnormality  PSYCH/NEURO: pleasant and cooperative, no obvious depression or anxiety, speech and thought processing grossly intact  ASSESSMENT AND PLAN:  Discussed the following assessment and plan:  Problem List Items Addressed This Visit    History of cholecystitis    Biliary drain is still in place.  Her grandson notes that removal is not an option at this point.  They will continue to see interventional radiology.  I did encourage them to contact interventional radiology to set up evaluation if flow remains low from the drain.      Relevant Orders   Comp Met (CMET)   Hypothyroidism    Patient needs a TSH checked.  She will continue levothyroxine 75 mcg daily.  This medication may have contributed to her weight loss.      Relevant Orders   TSH   Paroxysmal atrial fibrillation ([place])    Relatively asymptomatic.  She will  continue her current regimen through cardiology.      Severe protein-calorie malnutrition ([place])    Encouraged high-calorie food intake.          I discussed the assessment and treatment plan with the patient. The patient was provided an opportunity to ask questions and all were answered. The patient agreed with the plan and demonstrated an understanding of the instructions.   The patient was advised to call back or seek an in-person evaluation if the symptoms worsen or if the condition fails to improve as anticipated.  Tommi Rumps, MD

## 2020-05-10 NOTE — Assessment & Plan Note (Signed)
Patient needs a TSH checked.  She will continue levothyroxine 75 mcg daily.  This medication may have contributed to her weight loss.

## 2020-05-10 NOTE — Assessment & Plan Note (Signed)
Relatively asymptomatic.  She will continue her current regimen through cardiology.

## 2020-05-10 NOTE — Assessment & Plan Note (Signed)
Biliary drain is still in place.  Her grandson notes that removal is not an option at this point.  They will continue to see interventional radiology.  I did encourage them to contact interventional radiology to set up evaluation if flow remains low from the drain.

## 2020-05-10 NOTE — Assessment & Plan Note (Signed)
Encouraged high-calorie food intake.

## 2020-05-16 ENCOUNTER — Other Ambulatory Visit: Payer: Self-pay

## 2020-05-16 ENCOUNTER — Other Ambulatory Visit (INDEPENDENT_AMBULATORY_CARE_PROVIDER_SITE_OTHER): Payer: Medicare Other

## 2020-05-16 DIAGNOSIS — Z8719 Personal history of other diseases of the digestive system: Secondary | ICD-10-CM

## 2020-05-16 DIAGNOSIS — E039 Hypothyroidism, unspecified: Secondary | ICD-10-CM | POA: Diagnosis not present

## 2020-05-16 LAB — COMPREHENSIVE METABOLIC PANEL
ALT: 54 U/L — ABNORMAL HIGH (ref 0–35)
AST: 51 U/L — ABNORMAL HIGH (ref 0–37)
Albumin: 3.5 g/dL (ref 3.5–5.2)
Alkaline Phosphatase: 60 U/L (ref 39–117)
BUN: 11 mg/dL (ref 6–23)
CO2: 29 mEq/L (ref 19–32)
Calcium: 9 mg/dL (ref 8.4–10.5)
Chloride: 100 mEq/L (ref 96–112)
Creatinine, Ser: 1.06 mg/dL (ref 0.40–1.20)
GFR: 44.33 mL/min — ABNORMAL LOW (ref >60.00)
Glucose, Bld: 86 mg/dL (ref 70–99)
Potassium: 4.1 mEq/L (ref 3.5–5.1)
Sodium: 134 mEq/L — ABNORMAL LOW (ref 135–145)
Total Bilirubin: 0.5 mg/dL (ref 0.2–1.2)
Total Protein: 5.9 g/dL — ABNORMAL LOW (ref 6.0–8.3)

## 2020-05-16 LAB — TSH: TSH: 4.86 u[IU]/mL — ABNORMAL HIGH (ref 0.35–4.50)

## 2020-05-18 ENCOUNTER — Telehealth: Payer: Self-pay

## 2020-05-18 DIAGNOSIS — E0580 Other thyrotoxicosis without thyrotoxic crisis or storm: Secondary | ICD-10-CM

## 2020-05-18 DIAGNOSIS — R7989 Other specified abnormal findings of blood chemistry: Secondary | ICD-10-CM

## 2020-05-18 NOTE — Telephone Encounter (Signed)
-----   Message from [name], MD sent at 05/18/2020 10:40 AM EST ----- Please let the patients grandson know that her LFTs are slightly improved. Her TSH is slightly above goal. I would suggest increasing her synthroid to 50 mcg daily and rechecking that in 6 weeks.

## 2020-05-29 ENCOUNTER — Other Ambulatory Visit: Payer: Self-pay | Admitting: Family Medicine

## 2020-05-29 DIAGNOSIS — E039 Hypothyroidism, unspecified: Secondary | ICD-10-CM

## 2020-05-29 MED ORDER — LEVOTHYROXINE SODIUM 50 MCG PO TABS: 50.0000 ug | ORAL_TABLET | Freq: Every day | ORAL | 1 refills | Status: DC

## 2020-05-30 MED FILL — NORMAL SALINE FLUSH SYRINGE: 0.9 | 60 days supply | Qty: 600 | Fill #2

## 2020-06-10 IMAGING — CR CHEST - 2 VIEW
1 series · 2 of 2 positions shown · non-contrast
Comparison: CTA chest 07/05/2018 and earlier.

CLINICAL DATA: [AGE] female with mid chest pain.

EXAM:
CHEST - 2 VIEW

[Series 1: dg chest 2 view · 0.14mm/px · 2 of 2 slices shown]
[im 1/2]
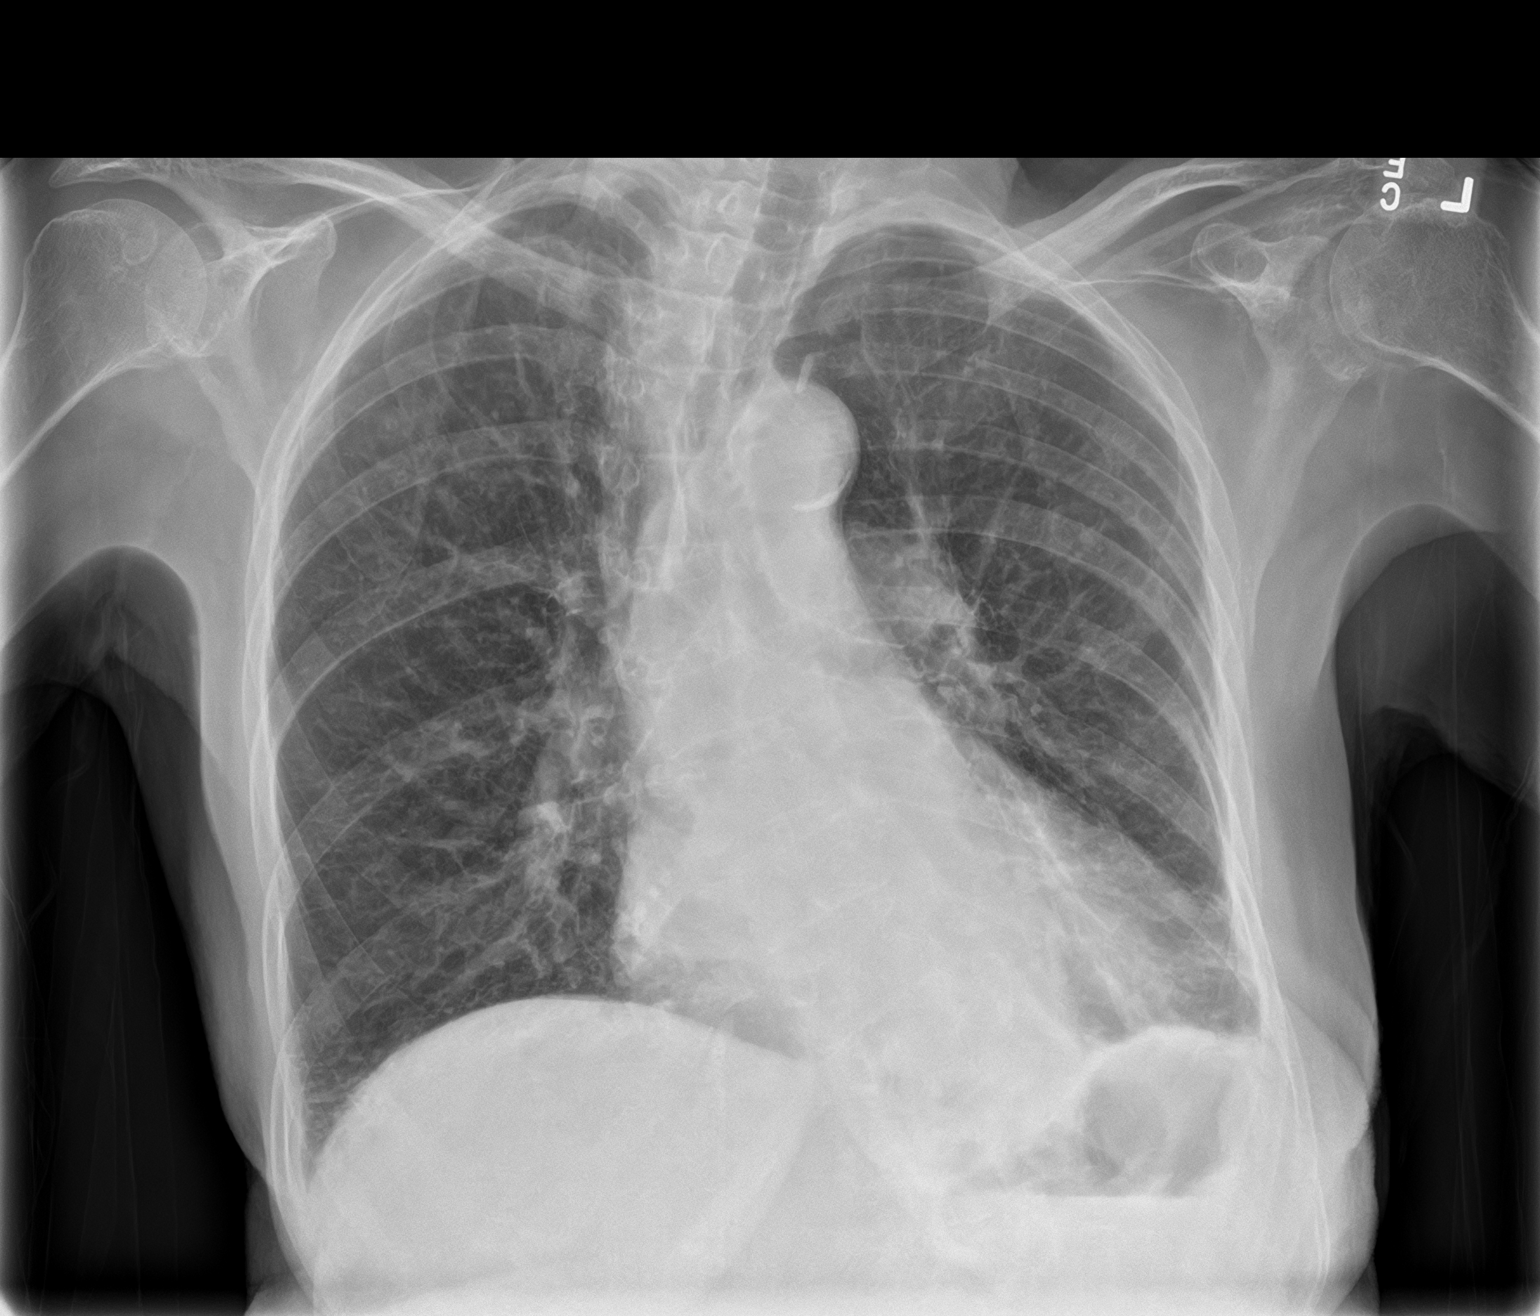
[im 2/2]
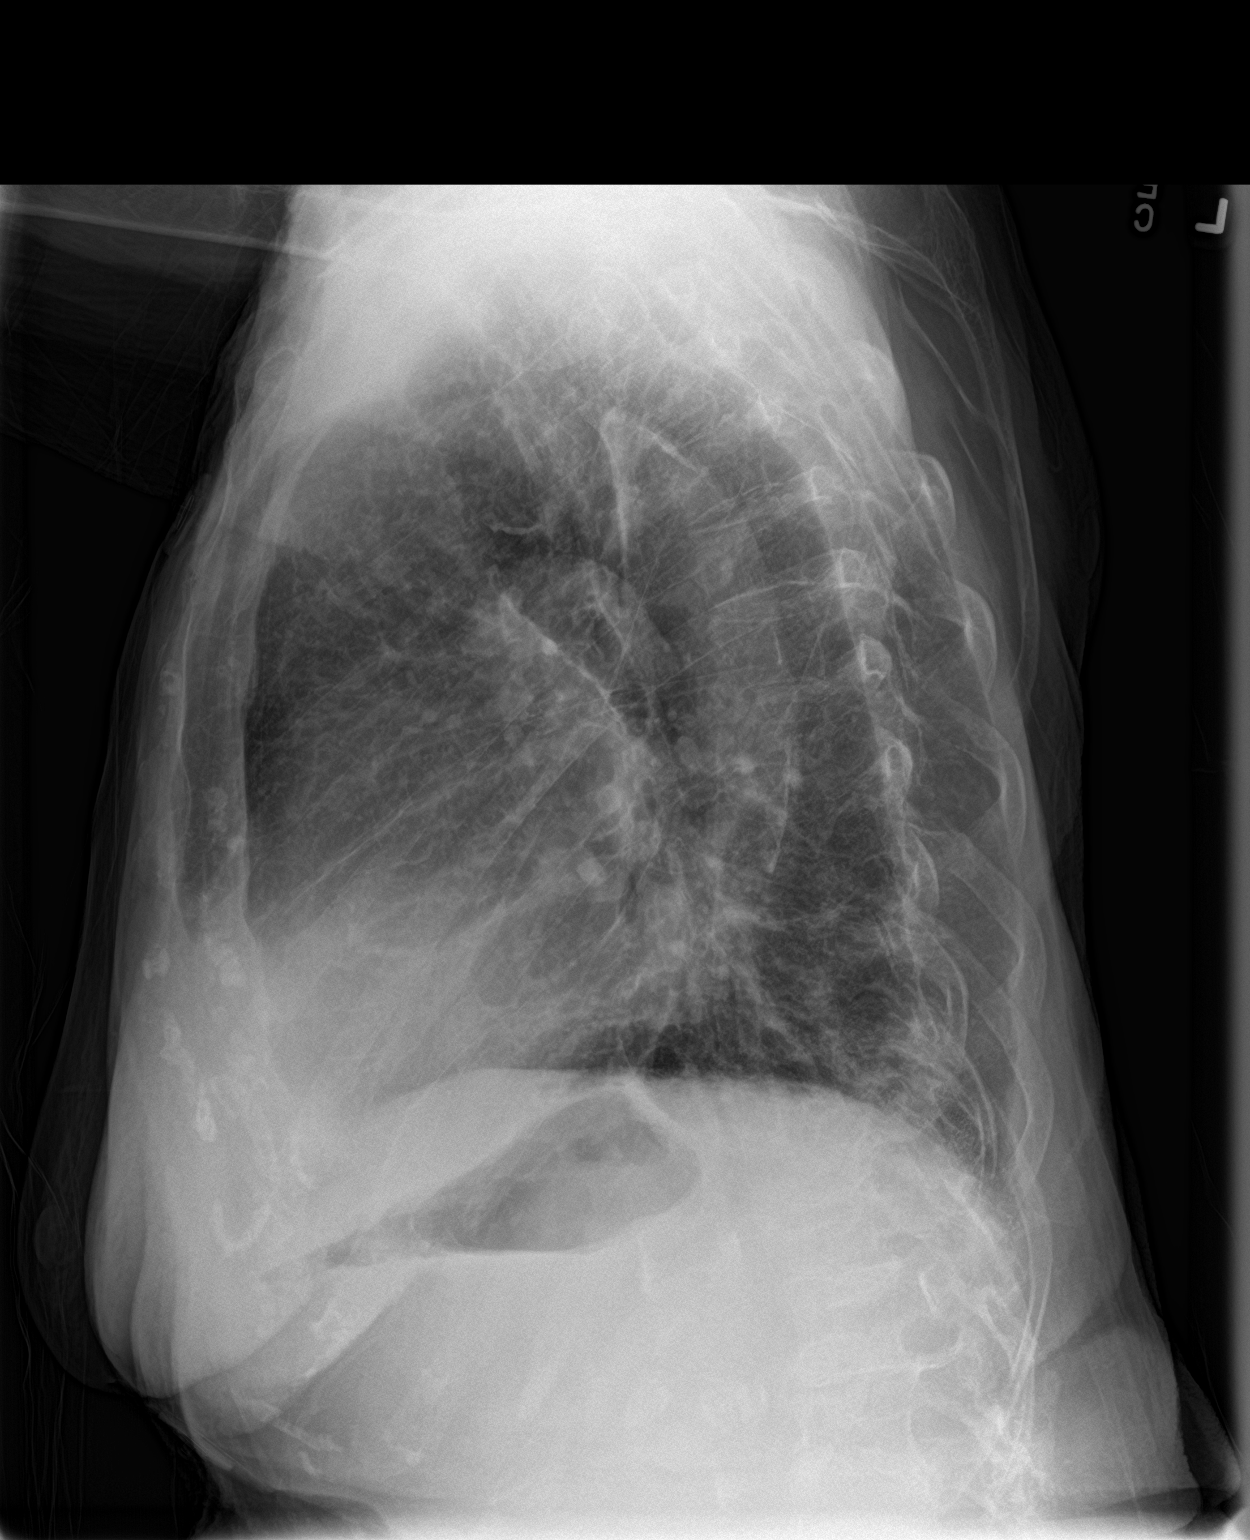

[2 of 2 positions shown; findings below may reference images not displayed]

FINDINGS: Moderate scoliosis. Stable mild cardiomegaly and tortuosity of the
thoracic aorta. Other mediastinal contours are within normal limits.
Visualized tracheal air column is within normal limits. No
pneumothorax, pulmonary edema, pleural effusion or acute pulmonary
opacity. Negative visible bowel gas pattern.
IMPRESSION: Stable.  No acute cardiopulmonary abnormality.

## 2020-06-12 IMAGING — US CHOLECYSTOSTOMY
1 series · 6 of 6 positions shown · non-contrast
Comparison: MRCP - 08/15/2018;

INDICATION: Concern for occlusive choledocholithiasis, now with hemodynamic
instability and poor candidate for endoscopy.

After stabilization in the ICU, request made for placement of a
percutaneous biliary drainage catheter for infection source control
purposes.
EXAM:
ULTRASOUND AND FLUOROSCOPIC-GUIDED CHOLECYSTOSTOMY TUBE PLACEMENT

[Series 1: cholecystostomy · 6 of 6 slices shown]
[im 1/6]
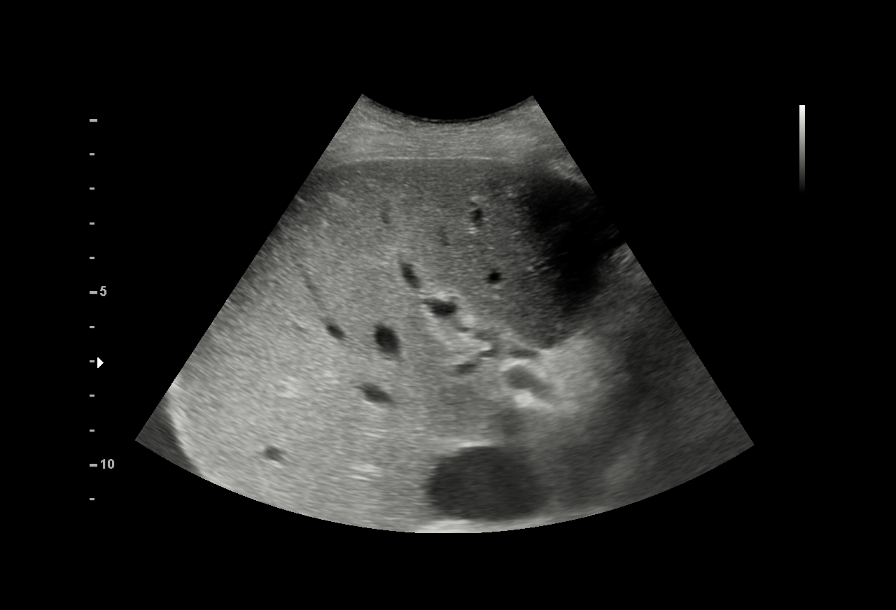
[im 2/6]
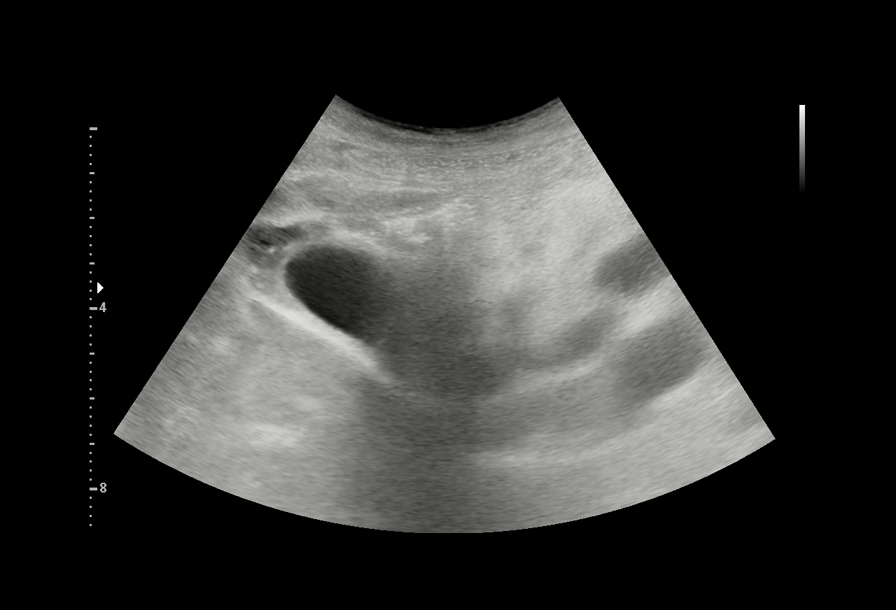
[im 3/6]
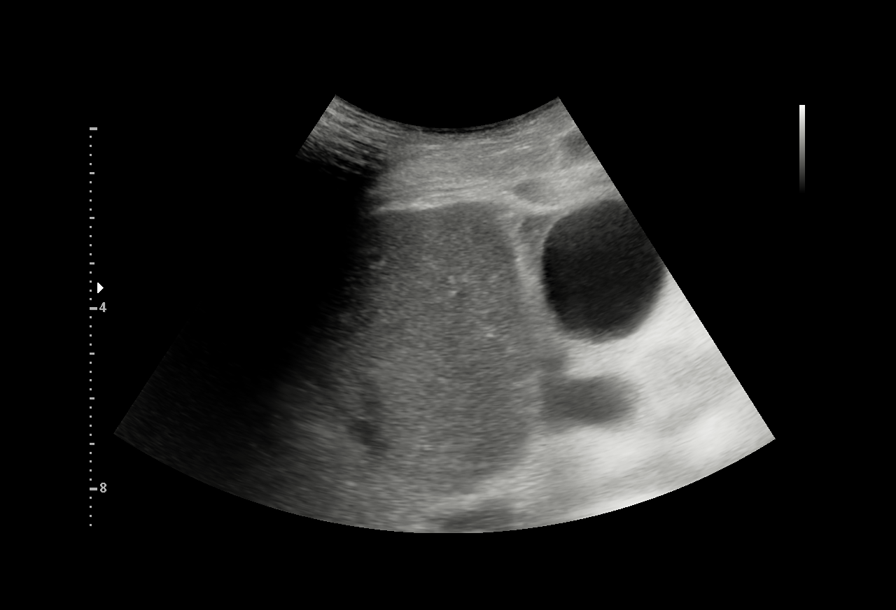
[im 4/6]
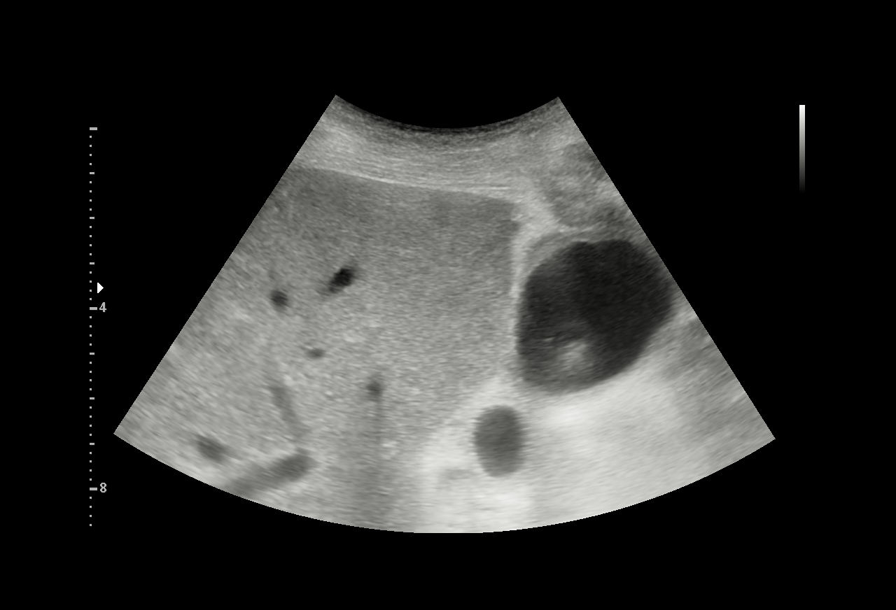
[im 5/6]
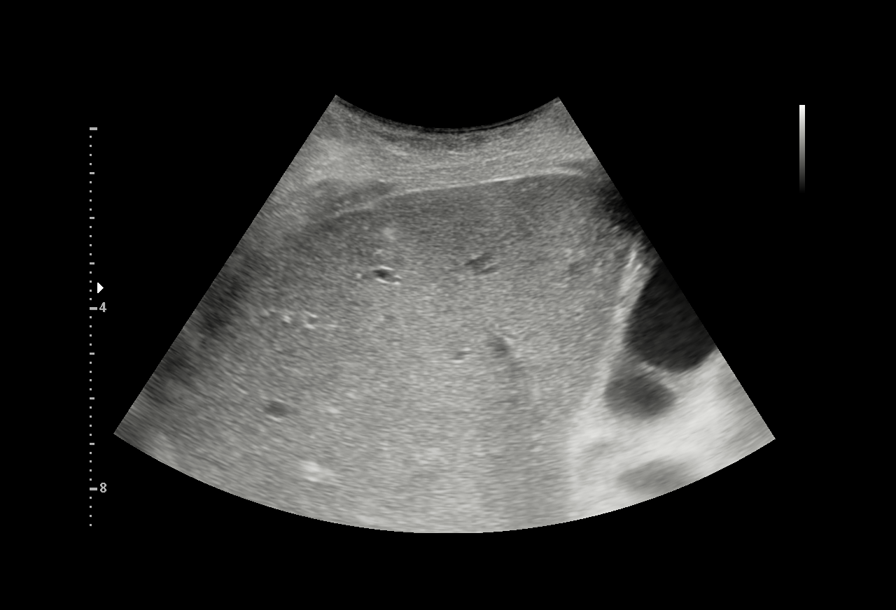
[im 6/6]
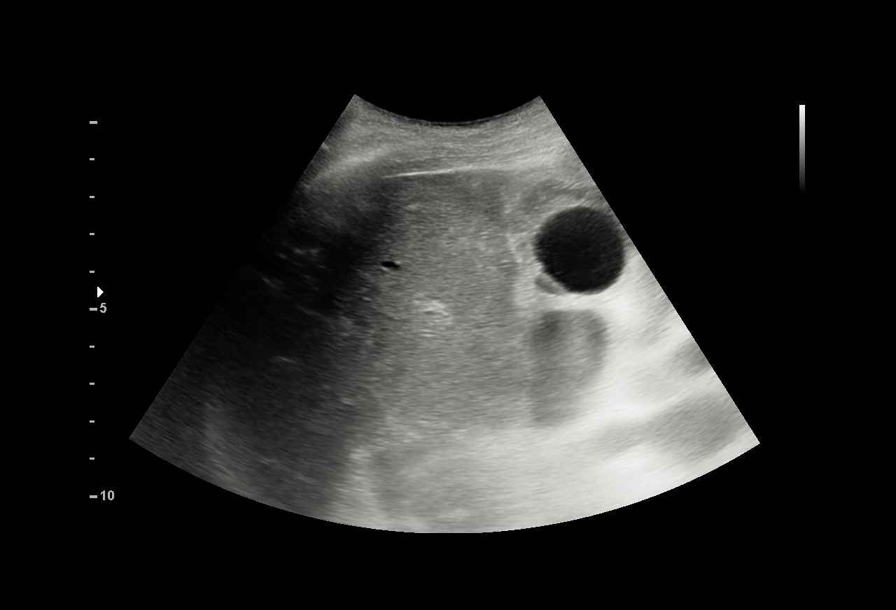

[6 of 6 positions shown; findings below may reference images not displayed]

07/06/2018; CT abdomen and pelvis -
08/14/2018

MEDICATIONS:
The patient is currently admitted to the hospital and on intravenous
antibiotics. Antibiotics were administered within an appropriate
time frame prior to skin puncture.

ANESTHESIA/SEDATION:
Moderate (conscious) sedation was employed during this procedure. A
total of Versed 1.5 mg and Fentanyl 50 mcg was administered
intravenously.

Moderate Sedation Time: 63 minutes. The patient's level of
consciousness and vital signs were monitored continuously by
radiology nursing throughout the procedure under my direct
supervision.

CONTRAST:  50mL OMNIPAQUE IOHEXOL 300 MG/ML SOLN - administered into
the biliary tree, hepatic parenchyma and gallbladder fossa.

FLUOROSCOPY TIME:  13 minutes, 48 seconds (55 mGy)

COMPLICATIONS:
None immediate.

PROCEDURE:
Informed written consent was obtained from the patient and the
patient's family after a discussion of the risks, benefits and
alternatives to treatment. Questions regarding the procedure were
encouraged and answered. A timeout was performed prior to the
initiation of the procedure.

The right upper abdominal quadrant was prepped and draped in the
usual sterile fashion, and a sterile drape was applied covering the
operative field. Maximum barrier sterile technique with sterile
gowns and gloves were used for the procedure. A timeout was
performed prior to the initiation of the procedure. Local anesthesia
was provided with 1% lidocaine with epinephrine.

Ultrasound evaluation of the abdomen was negative for any
significant dilatation of the peripheral aspect of the biliary tree.
Note is made of marked gallbladder wall thickening as well as the
presence of known cholelithiasis and gallbladder sludge.

Multiple attempts were made to cannulate the biliary tree both from
right and left-sided approaches. At one point, there is minimal
opacification of the central aspect of the right biliary tree with
eventual opacification of the common bile and cystic ducts.

Given patient's hemodynamic instability as well as lack of any
significant intrahepatic biliary ductal dilatation and patency of
the cystic duct the decision was made to proceed with
cholecystostomy tube placement.

As such, utilizing a transhepatic approach, a 22 gauge needle was
advanced into the gallbladder under direct ultrasound guidance. An
ultrasound image was saved for documentation purposes. Appropriate
intraluminal puncture was confirmed with the efflux of bile and
advancement of an 0.018 wire into the gallbladder lumen. The needle
was exchanged for an Accustick set. A small amount of contrast was
injected to confirm appropriate intraluminal positioning. Over a
short Amplatz wire, a 10.2-Suiun Bobkov cholecystomy tube was
advanced into the gallbladder fossa, coiled and locked. Bile was
aspirated and a small amount of contrast was injected as several
post procedural spot radiographic images were obtained in various
obliquities.

The catheter was secured to the skin with suture, connected to a
drainage bag and a dressing was placed. The patient tolerated the
procedure well without immediate post procedural complication.
FINDINGS: Sonographic evaluation of the abdomen is negative for any
significant intrahepatic biliary duct dilatation.

Note is made of gallbladder wall thickening pericholecystic fluid as
well as multiple radiopaque gallstones or biliary sludge.

Limited percutaneous cholangiogram demonstrates opacification of a
moderately dilated common bile duct as well as opacification of the
cystic duct. There is no definitive passage of contrast to the
distal aspect of the CBD.

Given lack of any significant intrahepatic biliary ductal
dilatation, as well as patency of the cystic duct and abnormal
appearance of the gallbladder, the decision was made to proceed with
cholecystostomy tube placement.

After image guided placement, the 10 French percutaneous
cholecystostomy tube is appropriately positioned with end coiled and
locked within the lumen of the gallbladder.
IMPRESSION: 1. Attempted though ultimately unsuccessful placement of an
internal/external biliary drainage catheter secondary to lack of any
significant dilatation of the intrahepatic biliary system.
2. Given abnormal sonographic appearance of the gallbladder as well
as limited percutaneous cholangiogram demonstrating patency of the
cystic duct, the decision was made to proceed with image guided
cholecystostomy tube placement for infection source control
purposes.
3. Successful ultrasound and fluoroscopic guided placement of a
French cholecystostomy tube.

PLAN:
- Continued resuscitative measures by the ICU staff.

- Maintain chole tube to gravity bag.

- Obtain daily CMP to evaluate bilirubin trend.

- Diagnostic percutaneous cholangiogram could be performed via the
existing cholecystostomy tube following the resolution of acute
symptoms.

Above findings discussed with Dr. Jilwaz at the time of procedure
completion.

## 2020-06-14 IMAGING — XA CHOLANGIOGRAM VIA EXISTING CATHETER
8 series · 10 of 10 positions shown · non-contrast
Comparison: none

INDICATION: Biliary ductal dilatation status post decompressive cholecystostomy
catheter placement.

[Series 1: fl (-) angio · 1 of 1 slices shown (1 of 8)]
[im 1/1]
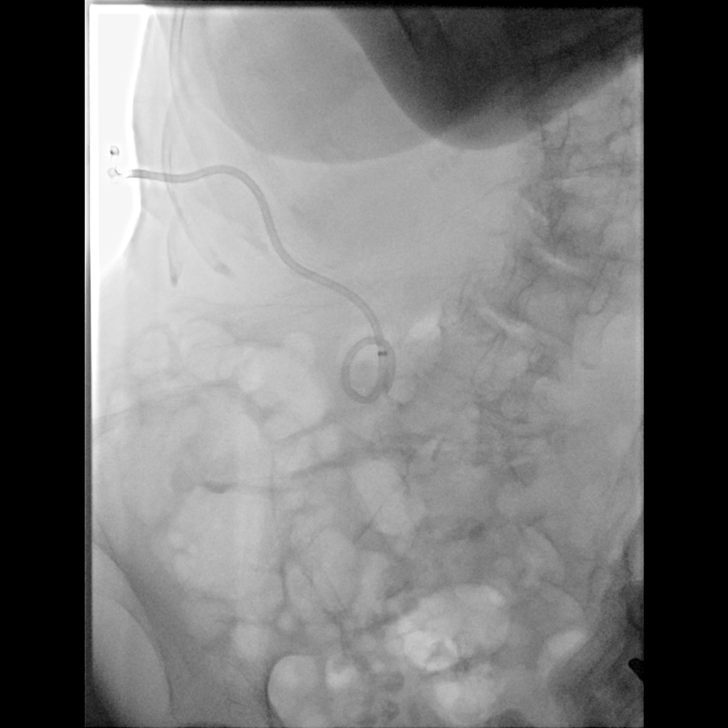

[Series 2: fl (-) angio · 1 of 1 slices shown (2 of 8)]
[im 1/1]
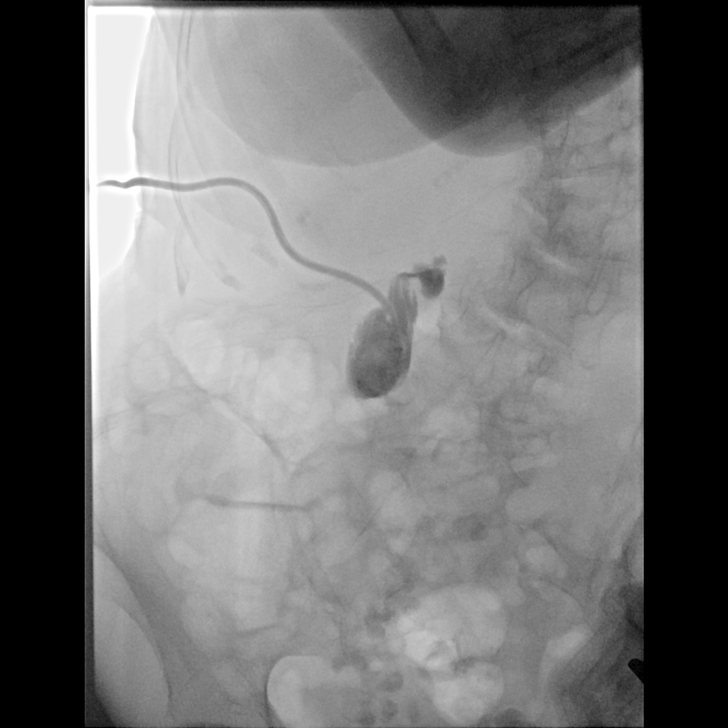

[Series 3: fl (-) angio · 1 of 1 slices shown (3 of 8)]
[im 1/1]
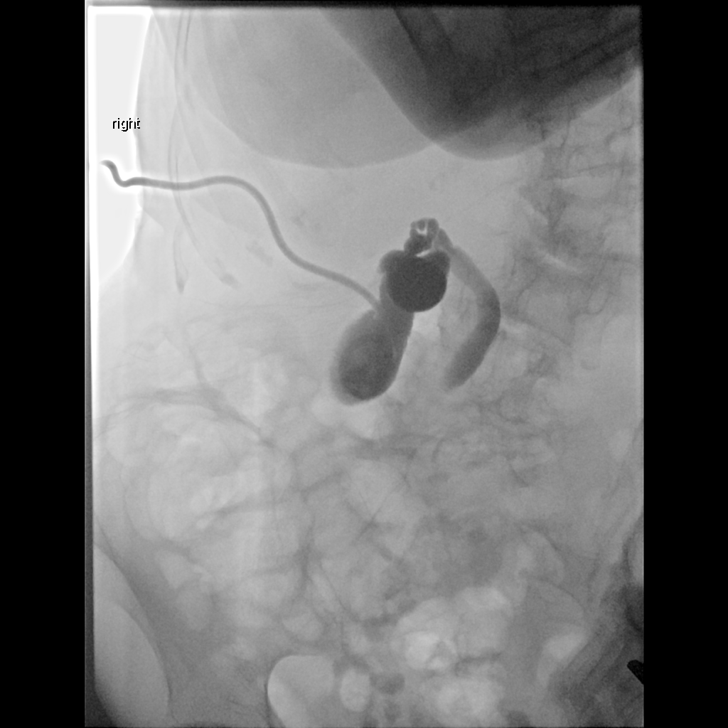

[Series 4: fl (-) angio · 1 of 1 slices shown (4 of 8)]
[im 1/1]
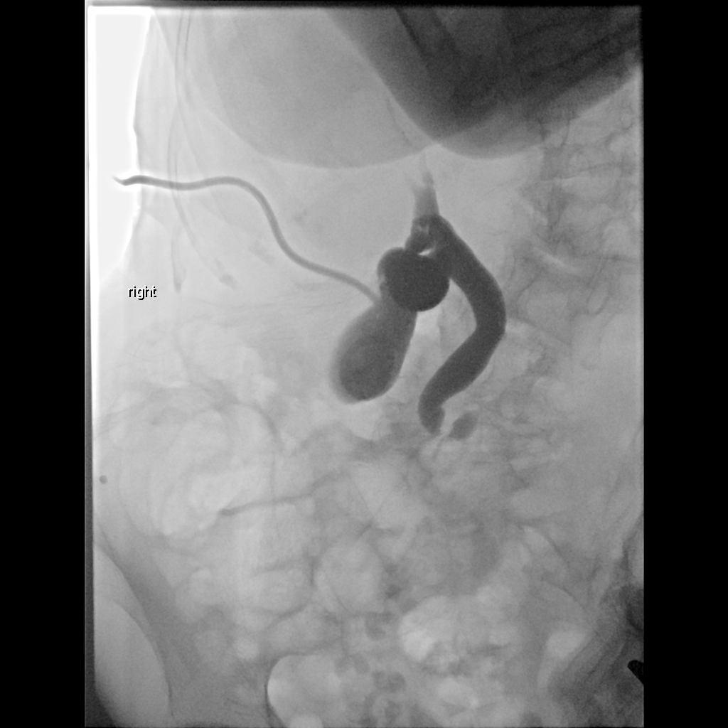

[Series 5: fl (-) angio · 1 of 1 slices shown (5 of 8)]
[im 1/1]
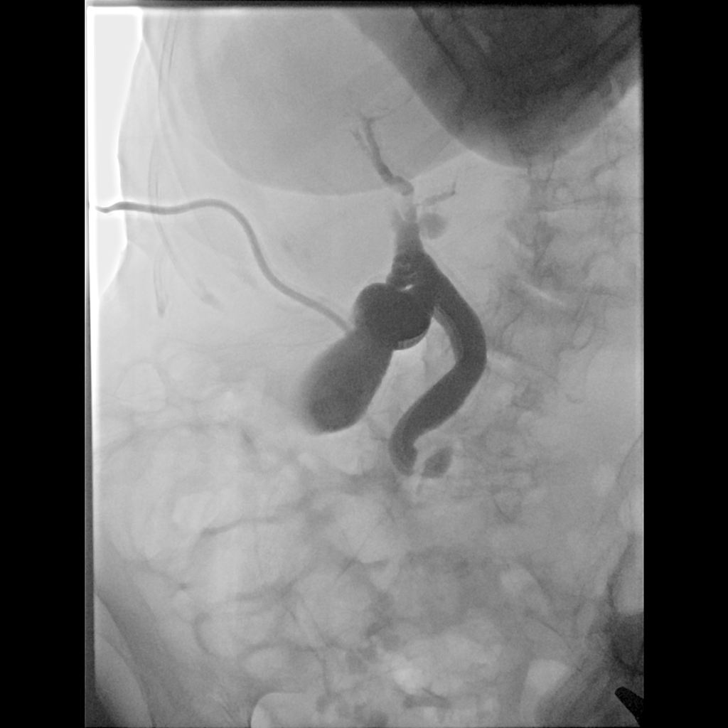

[Series 6: fl (-) angio · 1 of 1 slices shown (6 of 8)]
[im 1/1]
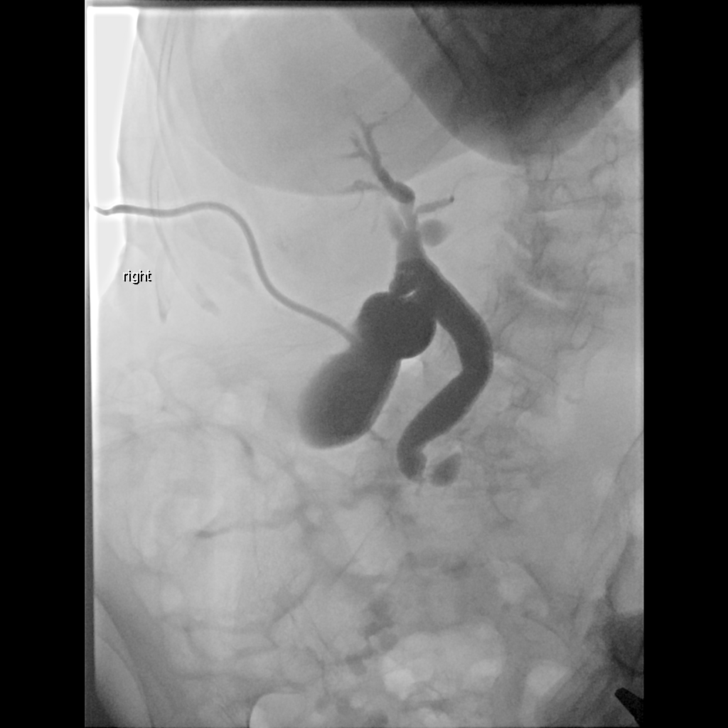

[Series 7: fl (-) angio · 2 of 2 slices shown (7 of 8)]
[im 1/2]
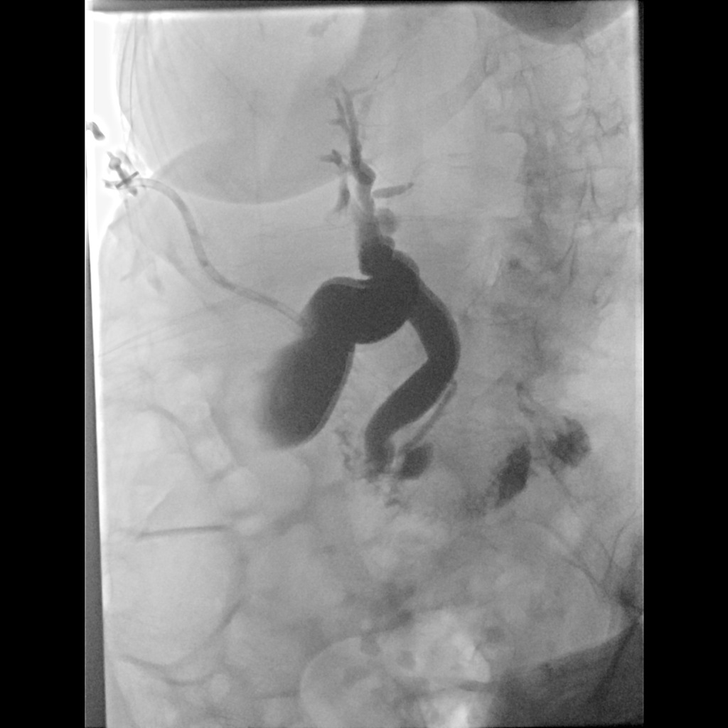
[im 2/2]
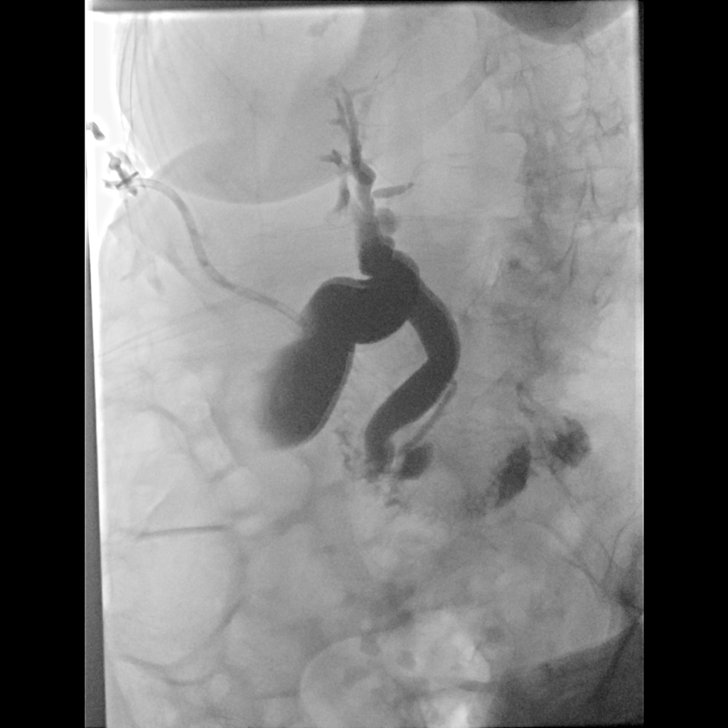

[Series 8: fl (-) angio · 2 of 2 slices shown (8 of 8)]
[im 1/2]
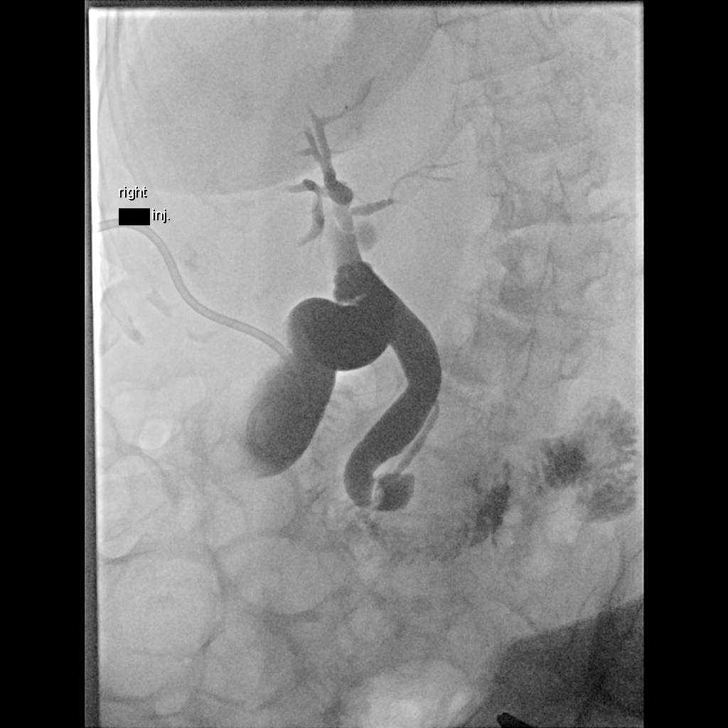
[im 2/2]
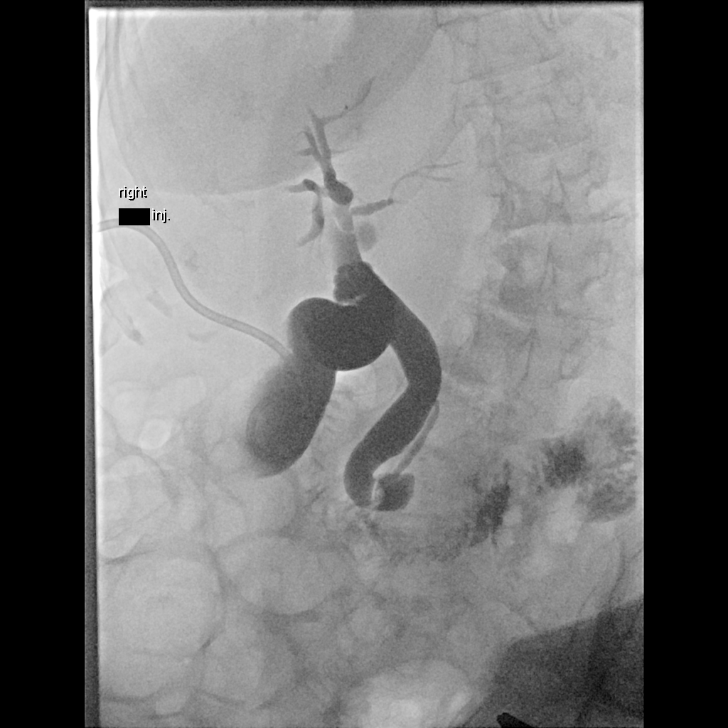

[10 of 10 positions shown; findings below may reference images not displayed]

EXAM:
CHOLANGIOGRAM THROUGH EXISTING CATHETER

MEDICATIONS:
None indicated

ANESTHESIA/SEDATION:
None required

FLUOROSCOPY TIME:  42 seconds; 5 mGy

COMPLICATIONS:
None immediate.

PROCEDURE:
Informed written consent was obtained from the patient and family
after a thorough discussion of the procedural risks, benefits and
alternatives. All questions were addressed. Maximal Sterile Barrier
Technique was utilized including caps, mask, sterile gowns, sterile
gloves, sterile drape, hand hygiene and skin antiseptic. A timeout
was performed prior to the initiation of the procedure.

Scout radiograph shows stable position of the cholecystostomy
catheter. Contrast injection shows normal filling of the
gallbladder. Cystic duct is patent. No filling defects in the CBD.
There is a short segment eccentric shelf-like area of high-grade
stenosis in the distal CBD just proximal to the ampulla. A small
amount of contrast traverses this area into the duodenum. Partial
opacification of the pancreatic duct in the head of the pancreas,
which is nondilated.
IMPRESSION: 1. High-grade short-segment shelf-like stenosis in the distal CBD.
Consider endoscopic evaluation and brush biopsy to exclude neoplasm.

## 2020-06-22 ENCOUNTER — Ambulatory Visit: Payer: Medicare Other

## 2020-06-22 DIAGNOSIS — D2271 Melanocytic nevi of right lower limb, including hip: Secondary | ICD-10-CM | POA: Diagnosis not present

## 2020-06-22 DIAGNOSIS — D485 Neoplasm of uncertain behavior of skin: Secondary | ICD-10-CM | POA: Diagnosis not present

## 2020-06-22 DIAGNOSIS — Z85828 Personal history of other malignant neoplasm of skin: Secondary | ICD-10-CM | POA: Diagnosis not present

## 2020-06-22 DIAGNOSIS — D225 Melanocytic nevi of trunk: Secondary | ICD-10-CM | POA: Diagnosis not present

## 2020-06-22 DIAGNOSIS — D2261 Melanocytic nevi of right upper limb, including shoulder: Secondary | ICD-10-CM | POA: Diagnosis not present

## 2020-06-22 DIAGNOSIS — D2262 Melanocytic nevi of left upper limb, including shoulder: Secondary | ICD-10-CM | POA: Diagnosis not present

## 2020-06-23 ENCOUNTER — Ambulatory Visit (INDEPENDENT_AMBULATORY_CARE_PROVIDER_SITE_OTHER): Payer: Medicare Other

## 2020-06-23 VITALS — Ht 64.0 in | Wt 99.0 lb

## 2020-06-23 DIAGNOSIS — Z0000 Encounter for general adult medical examination without abnormal findings: Secondary | ICD-10-CM

## 2020-06-23 NOTE — Progress Notes (Addendum)
Subjective:   Kylie May is a 87 y.o. female who presents for Medicare Annual (Subsequent) preventive examination.  Review of Systems    No ROS.  Medicare Wellness Virtual Visit.   Cardiac Risk Factors include: advanced age (>74men, >7 women)     Objective:    Today's Vitals   06/23/20 1334  Weight: 99 lb (44.9 kg)  Height: 5\' 4"  (1.626 m)   Body mass index is 16.99 kg/m.  Advanced Directives 06/23/2020 06/22/2019 11/16/2018 08/18/2018 08/15/2018 08/14/2018 07/05/2018  Does Patient Have a Medical Advance Directive? Yes Yes Yes Yes Yes No Yes  Type of Paramedic of [place];Living will Healthcare Power of [place]  Does patient want to make changes to medical advance directive? No - Patient declined No - Patient declined - No - Patient declined No - Patient declined - No - Patient declined  Copy of [place] in Chart? No - copy requested No - copy requested - No - copy requested No - copy requested - No - copy requested  Would patient like information on creating a medical advance directive? - - - No - Patient declined No - Patient declined No - Patient declined -    Current Medications (verified) Outpatient Encounter Medications as of 06/23/2020  Medication Sig  . amiodarone (PACERONE) 200 MG tablet Take 1 tablet (200 mg total) by mouth daily.  [name] apixaban (ELIQUIS) 2.5 MG TABS tablet Take 1 tablet (2.5 mg total) by mouth 2 (two) times daily.  . cholecalciferol (VITAMIN D3) 25 MCG (1000 UNIT) tablet Take 1,000 Units by mouth daily.  . ferrous sulfate 325 (65 FE) MG EC tablet Take 325 mg by mouth daily.  . fluticasone (FLONASE) 50 MCG/ACT nasal spray Place 2 sprays into both nostrils daily.  [name] levothyroxine (SYNTHROID) 50 MCG tablet Take 1 tablet (50 mcg total) by mouth daily.  . magnesium chloride (SLOW-MAG) 64 MG TBEC SR tablet Take 2 tablets  (128 mg total) by mouth daily.  . metoprolol tartrate (LOPRESSOR) 25 MG tablet Take 1 tablet (25 mg total) by mouth 2 (two) times daily.  [name] omeprazole (PRILOSEC) 40 MG capsule Take 1 capsule (40 mg total) by mouth daily.  . rosuvastatin (CRESTOR) 20 MG tablet Take 1 tablet (20 mg total) by mouth at bedtime.  . traMADol (ULTRAM) 50 MG tablet Take 50 mg by mouth as needed. (Patient not taking: Reported on 05/10/2020)   No facility-administered encounter medications on file as of 06/23/2020.    Allergies (verified) Metronidazole and Penicillin g   History: Past Medical History:  Diagnosis Date  . A-fib ([place])   . Acid reflux disease   . Arrhythmia   . Atrial fibrillation with RVR ([place]) 08/15/2018  . Chronic diastolic CHF (congestive heart failure) ([place])   . Compression fracture of lumbar vertebra ([place])   . Diverticulitis 01-2008   [place]  . Diverticulosis   . Esophageal stricture 2006  . Femoral bruit    Right  . Fibrocystic breast disease   . Hiatal hernia   . History of esophageal stricture 06/14/2004   2006 esophageal dilation Schatzki's ring   . History of skin cancer 12/29/2008   Basal cell cancer right glabella 07/10/2010 Dr. Evorn Gong, [facility] @ [place] , [place]   . Hypercholesterolemia    [place] study LDL goal = < 160  . Mitral regurgitation    a. 06/2014 EF 55-60%, elevated  end-diastolic pressures, dilated LA at 4.3 cm, mildly dilated RA, severe mitral regurgitation, mild aortic sclerosis without stenosis, mod-severe TR  . Mitral valve prolapse   . Osteoporosis    Dr Marcelle Overlie  . Pancreatitis 07-2008   Hospitalized   . Patella fracture 05/13/2018  . Schatzki's ring   . Sepsis ([facility]) 08/15/2018  . Skin cancer    facial x 2. Dr Adolphus Birchwood   Past Surgical History:  Procedure Laterality Date  . BILIARY STENT PLACEMENT  08/21/2018   Procedure: BILIARY STENT PLACEMENT;  Surgeon: Kerin Salen, MD;  Location: [facility] ENDOSCOPY;  Service: Gastroenterology;;  . COLONOSCOPY  2006  .  Epidural steroids     x1  . ERCP N/A 08/21/2018   Procedure: ENDOSCOPIC RETROGRADE CHOLANGIOPANCREATOGRAPHY (ERCP);  Surgeon: Kerin Salen, MD;  Location: [facility] ENDOSCOPY;  Service: Gastroenterology;  Laterality: N/A;  . ESOPHAGEAL DILATION  2006  . ESOPHAGOGASTRODUODENOSCOPY (EGD) WITH PROPOFOL N/A 11/16/2018   Procedure: ESOPHAGOGASTRODUODENOSCOPY (EGD) WITH PROPOFOL;  Surgeon: Toney Reil, MD;  Location: [facility] ENDOSCOPY;  Service: Gastroenterology;  Laterality: N/A;  . G3 P4 (twins)    . IR CHOLANGIOGRAM EXISTING TUBE  08/18/2018  . IR EXCHANGE BILIARY DRAIN  11/03/2018  . IR EXCHANGE BILIARY DRAIN  12/01/2018  . IR EXCHANGE BILIARY DRAIN  05/13/2019  . IR EXCHANGE BILIARY DRAIN  06/14/2019  . IR EXCHANGE BILIARY DRAIN  07/19/2019  . IR EXCHANGE BILIARY DRAIN  08/17/2019  . IR EXCHANGE BILIARY DRAIN  09/30/2019  . IR EXCHANGE BILIARY DRAIN  12/08/2019  . IR EXCHANGE BILIARY DRAIN  01/19/2020  . IR EXCHANGE BILIARY DRAIN  03/01/2020  . IR EXCHANGE BILIARY DRAIN  04/20/2020  . IR PERC CHOLECYSTOSTOMY  08/16/2018  . REMOVAL OF STONES  08/21/2018   Procedure: REMOVAL OF STONES;  Surgeon: Kerin Salen, MD;  Location: [facility] ENDOSCOPY;  Service: Gastroenterology;;  . SKIN CANCER EXCISION    . SPHINCTEROTOMY  08/21/2018   Procedure: SPHINCTEROTOMY;  Surgeon: Kerin Salen, MD;  Location: [facility] ENDOSCOPY;  Service: Gastroenterology;;   Family History  Problem Relation Age of Onset  . Breast cancer Maternal Aunt   . Hepatitis Daughter        C  . Lung disease Mother        lung tumor  . Heart attack Daughter 24       S/P stents  . Liver cancer Daughter   . Heart attack Sister 95  . Diabetes Neg Hx   . Stroke Neg Hx    Social History   Socioeconomic History  . Marital status: Widowed    Spouse name: Not on file  . Number of children: 3  . Years of education: Not on file  . Highest education level: Not on file  Occupational History  . Occupation: Manufacturing engineer --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 Smoking status: Former Smoker    Quit date: 06/04/1951    Years since quitting: 69.1  . Smokeless tobacco: Never Used  . Tobacco comment: Smoked 1945-1953 , up to 2 cigarettes/day  Vaping Use  . Vaping Use: Never used  Substance and Sexual Activity  . Alcohol use: Yes    Alcohol/week: 1.0 standard drink    Types: 1 Glasses of wine per week    Comment: occassional  . Drug use: No  . Sexual activity: Not Currently  Other Topics Concern  . Not on file  Social History Narrative   Has living will    Would  want daughter Santiago Glad to be health care POA   Not sure about DNR--definitely doesn't want prolonged life support   Not sure about tube feeds either (but probably not)         Social Determinants of Health   Financial Resource Strain: Not on file  Food Insecurity: Not on file  Transportation Needs: Not on file  Physical Activity: Not on file  Stress: Not on file  Social Connections: Not on file    Tobacco Counseling Counseling given: Not Answered Comment: Smoked [phone] , up to 2 cigarettes/day   Clinical Intake:  Pre-visit preparation completed: Yes        Diabetes: No  How often do you need to have someone help you when you read instructions, pamphlets, or other written materials from your doctor or pharmacy?: 1 - Never    Interpreter Needed?: No      Activities of Daily Living In your present state of health, do you have any difficulty performing the following activities: 06/23/2020  Hearing? Y  Vision? N  Difficulty concentrating or making decisions? N  Comment Age appropriate  Walking or climbing stairs? Y  Comment Unsteady gait. Walker in use.  Dressing or bathing? N  Doing errands, shopping? Y  Preparing Food and eating ? Y  Using the Toilet? N  In the past six months, have you accidently leaked urine? Y  Comment Managed with daily brief  Do you have problems with loss of bowel control? N   Comment Managed with daily brief  Managing your Medications? Y  Managing your Finances? Y  Comment Grandson assists as needed  Housekeeping or managing your Housekeeping? Y  Comment Maid assist  Some recent data might be hidden    Patient Care Team: [name], MD as PCP - General (Family Medicine) Rockey Situ, Kathlene November, MD as PCP - Cardiology (Cardiology) Minna Merritts, MD as Consulting Physician (Cardiology)  Indicate any recent Medical Services you may have received from other than [facility] providers in the past year (date may be approximate).     Assessment:   This is a routine wellness examination for Donald.  I connected with Blaire today by telephone and verified that I am speaking with the correct person using two identifiers. Location patient: home Location provider: work Persons participating in the virtual visit: patient, Marine scientist.    I discussed the limitations, risks, security and privacy concerns of performing an evaluation and management service by telephone and the availability of in person appointments. The patient expressed understanding and verbally consented to this telephonic visit.    Interactive audio and video telecommunications were attempted between this provider and patient, however failed, due to patient having technical difficulties OR patient did not have access to video capability.  We continued and completed visit with audio only.  Some vital signs may be absent or patient reported.   Hearing/Vision screen  Hearing Screening   125Hz  250Hz  500Hz  1000Hz  2000Hz  3000Hz  4000Hz  6000Hz  8000Hz   Right ear:           Left ear:           Comments: Hearing aid, bilateral  Vision Screening Comments: Followed by [facility]  Wears corrective lenses  Cataract extraction, bilateral  Visual acuity not assessed per patient preference since they have regular follow up with the ophthalmologist  Dietary issues and exercise activities  discussed: Current Exercise Habits: Home exercise routine (Chair exercises, peddler), Intensity: Mild  Goals    . DIET - INCREASE  WATER INTAKE     Stay hydrated      Depression Screen PHQ 2/9 Scores 06/23/2020 05/10/2020 09/17/2019 06/22/2019 06/11/2018 05/30/2017 10/02/2016  PHQ - 2 Score 0 0 0 0 0 0 0    Fall Risk Fall Risk  06/23/2020 05/10/2020 09/17/2019 06/22/2019 05/30/2017  Falls in the past year? 1 1 1  0 No  Comment - - - None since last reported 01/2019 -  Number falls in past yr: 1 0 0 - -  Injury with Fall? 0 0 1 - -  Risk Factor Category  - - - - -  Risk for fall due to : Impaired balance/gait;History of fall(s) - - - -  Follow up Falls evaluation completed Falls evaluation completed Falls evaluation completed Falls evaluation completed -    FALL RISK PREVENTION PERTAINING TO THE HOME: Handrails in use when climbing stairs? Yes Home free of loose throw rugs in walkways, pet beds, electrical cords, etc? Yes  Adequate lighting in your home to reduce risk of falls? Yes   ASSISTIVE DEVICES UTILIZED TO PREVENT FALLS: Life alert? No   Use of a cane, walker or w/c? Yes , walker Elevated toilet seat or a handicapped toilet? Yes   TIMED UP AND GO: Was the test performed? No . Virtual visit.   Cognitive Function: MMSE - Mini Mental State Exam 06/22/2019 05/30/2016  Not completed: Unable to complete -  Orientation to time - 5  Orientation to Place - 5  Registration - 3  Attention/ Calculation - 5  Recall - 3  Language- name 2 objects - 2  Language- repeat - 1  Language- follow 3 step command - 3  Language- read & follow direction - 1  Write a sentence - 1  Copy design - 1  Total score - 30     6CIT Screen 06/11/2018 05/30/2017  What Year? 0 points 0 points  What month? 0 points 0 points  What time? 0 points 0 points  Count back from 20 0 points 0 points  Months in reverse 0 points 0 points  Repeat phrase 0 points 0 points  Total Score 0 0     Immunizations Immunization History  Administered Date(s) Administered  . Fluad Quad(high Dose 65+) 02/26/2019  . H1N1 08/08/2008  . Influenza, High Dose Seasonal PF 02/24/2018  . Influenza,inj,Quad PF,6+ Mos 02/17/2014  . Influenza-Unspecified 03/03/2016, 02/24/2017, 03/29/2020  . PFIZER(Purple Top)SARS-COV-2 Vaccination 06/21/2019, 07/19/2019  . Pneumococcal Conjugate-13 02/17/2014  . Pneumococcal Polysaccharide-23 11/10/2012  . Td 01/09/2010  . Tdap 04/18/2016  . Zoster Recombinat (Shingrix) 11/25/2016, 04/12/2017    Health Maintenance Health Maintenance  Topic Date Due  . COVID-19 Vaccine (3 - Booster for Pfizer series) 07/09/2020 (Originally 01/16/2020)  . TETANUS/TDAP  04/18/2026  . INFLUENZA VACCINE  Completed  . DEXA SCAN  Completed  . PNA vac Low Risk Adult  Completed   Colorectal cancer screening: No longer required.   Mammogram- 12/20/13. no longer required.   Lung Cancer Screening: (Low Dose CT Chest recommended if Age 24-80 years, 30 pack-year currently smoking OR have quit w/in 15years.) does not qualify.   Hepatitis C Screening: does not qualify.  Vision Screening: Recommended annual ophthalmology exams for early detection of glaucoma and other disorders of the eye. Is the patient up to date with their annual eye exam?  Yes  Who is the provider or what is the name of the office in which the patient attends annual eye exams? [place]  Dental Screening: Recommended annual  dental exams for proper oral hygiene.   Community Resource Referral / Chronic Care Management: CRR required this visit?  No   CCM required this visit?  No      Plan:   Keep all routine maintenance appointments.   Next scheduled lab 07/24/20 @ 11:00  Follow up 11/14/20 @ 10:30  I have personally reviewed and noted the following in the patient's chart:   . Medical and social history . Use of alcohol, tobacco or illicit drugs  . Current medications and  supplements . Functional ability and status . Nutritional status . Physical activity . Advanced directives . List of other physicians . Hospitalizations, surgeries, and ER visits in previous 12 months . Vitals . Screenings to include cognitive, depression, and falls . Referrals and appointments  In addition, I have reviewed and discussed with patient certain preventive protocols, quality metrics, and best practice recommendations. A written personalized care plan for preventive services as well as general preventive health recommendations were provided to patient via mychart.     Varney Biles, LPN   [phone]

## 2020-06-23 NOTE — Patient Instructions (Addendum)
Kylie May , Thank you for taking time to come for your Medicare Wellness Visit. I appreciate your ongoing commitment to your health goals. Please review the following plan we discussed and let me know if I can assist you in the future.   These are the goals we discussed: Goals    . DIET - INCREASE WATER INTAKE     Stay hydrated       This is a list of the screening recommended for you and due dates:  Health Maintenance  Topic Date Due  . COVID-19 Vaccine (3 - Booster for Pfizer series) 07/09/2020*  . Tetanus Vaccine  04/18/2026  . Flu Shot  Completed  . DEXA scan (bone density measurement)  Completed  . Pneumonia vaccines  Completed  *Topic was postponed. The date shown is not the original due date.    Immunizations Immunization History  Administered Date(s) Administered  . Fluad Quad(high Dose 65+) 02/26/2019  . H1N1 08/08/2008  . Influenza, High Dose Seasonal PF 02/24/2018  . Influenza,inj,Quad PF,6+ Mos 02/17/2014  . Influenza-Unspecified 03/03/2016, 02/24/2017, 03/29/2020  . PFIZER(Purple Top)SARS-COV-2 Vaccination 06/21/2019, 07/19/2019  . Pneumococcal Conjugate-13 02/17/2014  . Pneumococcal Polysaccharide-23 11/10/2012  . Td 01/09/2010  . Tdap 04/18/2016  . Zoster Recombinat (Shingrix) 11/25/2016, 04/12/2017   Keep all routine maintenance appointments.   Next scheduled lab 07/24/20 @ 11:00  Follow up 11/14/20 @ 10:30  Advanced directives: End of life planning; Advance aging; Advanced directives discussed.  Copy of current HCPOA/Living Will requested.    Conditions/risks identified: none new.  Follow up in one year for your annual wellness visit.   Preventive Care 72 Years and Older, Female Preventive care refers to lifestyle choices and visits with your health care provider that can promote health and wellness. What does preventive care include?  A yearly physical exam. This is also called an annual well check.  Dental exams once or twice a  year.  Routine eye exams. Ask your health care provider how often you should have your eyes checked.  Personal lifestyle choices, including:  Daily care of your teeth and gums.  Regular physical activity.  Eating a healthy diet.  Avoiding tobacco and drug use.  Limiting alcohol use.  Practicing safe sex.  Taking low-dose aspirin every day.  Taking vitamin and mineral supplements as recommended by your health care provider. What happens during an annual well check? The services and screenings done by your health care provider during your annual well check will depend on your age, overall health, lifestyle risk factors, and family history of disease. Counseling  Your health care provider may ask you questions about your:  Alcohol use.  Tobacco use.  Drug use.  Emotional well-being.  Home and relationship well-being.  Sexual activity.  Eating habits.  History of falls.  Memory and ability to understand (cognition).  Work and work Statistician.  Reproductive health. Screening  You may have the following tests or measurements:  Height, weight, and BMI.  Blood pressure.  Lipid and cholesterol levels. These may be checked every 5 years, or more frequently if you are over 26 years old.  Skin check.  Lung cancer screening. You may have this screening every year starting at age 45 if you have a 30-pack-year history of smoking and currently smoke or have quit within the past 15 years.  Fecal occult blood test (FOBT) of the stool. You may have this test every year starting at age 47.  Flexible sigmoidoscopy or colonoscopy. You may have a sigmoidoscopy  every 5 years or a colonoscopy every 10 years starting at age 59.  Hepatitis C blood test.  Hepatitis B blood test.  Sexually transmitted disease (STD) testing.  Diabetes screening. This is done by checking your blood sugar (glucose) after you have not eaten for a while (fasting). You may have this done every 1-3  years.  Bone density scan. This is done to screen for osteoporosis. You may have this done starting at age 80.  Mammogram. This may be done every 1-2 years. Talk to your health care provider about how often you should have regular mammograms. Talk with your health care provider about your test results, treatment options, and if necessary, the need for more tests. Vaccines  Your health care provider may recommend certain vaccines, such as:  Influenza vaccine. This is recommended every year.  Tetanus, diphtheria, and acellular pertussis (Tdap, Td) vaccine. You may need a Td booster every 10 years.  Zoster vaccine. You may need this after age 72.  Pneumococcal 13-valent conjugate (PCV13) vaccine. One dose is recommended after age 47.  Pneumococcal polysaccharide (PPSV23) vaccine. One dose is recommended after age 59. Talk to your health care provider about which screenings and vaccines you need and how often you need them. This information is not intended to replace advice given to you by your health care provider. Make sure you discuss any questions you have with your health care provider. Document Released: 06/16/2015 Document Revised: 02/07/2016 Document Reviewed: 03/21/2015 Elsevier Interactive Patient Education  2017 [place] Prevention in the Home Falls can cause injuries. They can happen to people of all ages. There are many things you can do to make your home safe and to help prevent falls. What can I do on the outside of my home?  Regularly fix the edges of walkways and driveways and fix any cracks.  Remove anything that might make you trip as you walk through a door, such as a raised step or threshold.  Trim any bushes or trees on the path to your home.  Use bright outdoor lighting.  Clear any walking paths of anything that might make someone trip, such as rocks or tools.  Regularly check to see if handrails are loose or broken. Make sure that both sides of any  steps have handrails.  Any raised decks and porches should have guardrails on the edges.  Have any leaves, snow, or ice cleared regularly.  Use sand or salt on walking paths during winter.  Clean up any spills in your garage right away. This includes oil or grease spills. What can I do in the bathroom?  Use night lights.  Install grab bars by the toilet and in the tub and shower. Do not use towel bars as grab bars.  Use non-skid mats or decals in the tub or shower.  If you need to sit down in the shower, use a plastic, non-slip stool.  Keep the floor dry. Clean up any water that spills on the floor as soon as it happens.  Remove soap buildup in the tub or shower regularly.  Attach bath mats securely with double-sided non-slip rug tape.  Do not have throw rugs and other things on the floor that can make you trip. What can I do in the bedroom?  Use night lights.  Make sure that you have a light by your bed that is easy to reach.  Do not use any sheets or blankets that are too big for your bed. They should  not hang down onto the floor.  Have a firm chair that has side arms. You can use this for support while you get dressed.  Do not have throw rugs and other things on the floor that can make you trip. What can I do in the kitchen?  Clean up any spills right away.  Avoid walking on wet floors.  Keep items that you use a lot in easy-to-reach places.  If you need to reach something above you, use a strong step stool that has a grab bar.  Keep electrical cords out of the way.  Do not use floor polish or wax that makes floors slippery. If you must use wax, use non-skid floor wax.  Do not have throw rugs and other things on the floor that can make you trip. What can I do with my stairs?  Do not leave any items on the stairs.  Make sure that there are handrails on both sides of the stairs and use them. Fix handrails that are broken or loose. Make sure that handrails are  as long as the stairways.  Check any carpeting to make sure that it is firmly attached to the stairs. Fix any carpet that is loose or worn.  Avoid having throw rugs at the top or bottom of the stairs. If you do have throw rugs, attach them to the floor with carpet tape.  Make sure that you have a light switch at the top of the stairs and the bottom of the stairs. If you do not have them, ask someone to add them for you. What else can I do to help prevent falls?  Wear shoes that:  Do not have high heels.  Have rubber bottoms.  Are comfortable and fit you well.  Are closed at the toe. Do not wear sandals.  If you use a stepladder:  Make sure that it is fully opened. Do not climb a closed stepladder.  Make sure that both sides of the stepladder are locked into place.  Ask someone to hold it for you, if possible.  Clearly mark and make sure that you can see:  Any grab bars or handrails.  First and last steps.  Where the edge of each step is.  Use tools that help you move around (mobility aids) if they are needed. These include:  Canes.  Walkers.  Scooters.  Crutches.  Turn on the lights when you go into a dark area. Replace any light bulbs as soon as they burn out.  Set up your furniture so you have a clear path. Avoid moving your furniture around.  If any of your floors are uneven, fix them.  If there are any pets around you, be aware of where they are.  Review your medicines with your doctor. Some medicines can make you feel dizzy. This can increase your chance of falling. Ask your doctor what other things that you can do to help prevent falls. This information is not intended to replace advice given to you by your health care provider. Make sure you discuss any questions you have with your health care provider. Document Released: 03/16/2009 Document Revised: 10/26/2015 Document Reviewed: 06/24/2014 Elsevier Interactive Patient Education  2017 [employer].

## 2020-06-29 ENCOUNTER — Other Ambulatory Visit (HOSPITAL_COMMUNITY): Payer: Self-pay | Admitting: Interventional Radiology

## 2020-06-29 DIAGNOSIS — Z434 Encounter for attention to other artificial openings of digestive tract: Secondary | ICD-10-CM

## 2020-07-03 ENCOUNTER — Other Ambulatory Visit: Payer: Medicare Other

## 2020-07-06 ENCOUNTER — Ambulatory Visit (HOSPITAL_COMMUNITY)
Admission: RE | Admit: 2020-07-06 | Discharge: 2020-07-06 | Disposition: A | Discharge status: Home / Self Care | Payer: Medicare Other | Source: Ambulatory Visit | Attending: Interventional Radiology | Admitting: Interventional Radiology

## 2020-07-06 ENCOUNTER — Other Ambulatory Visit: Payer: Self-pay

## 2020-07-06 DIAGNOSIS — Z434 Encounter for attention to other artificial openings of digestive tract: Secondary | ICD-10-CM | POA: Insufficient documentation

## 2020-07-06 HISTORY — PX: IR EXCHANGE BILIARY DRAIN: IMG6046

## 2020-07-06 MED ORDER — IOHEXOL 300 MG/ML  SOLN
50.0000 mL | Freq: Once | INTRAMUSCULAR | Status: AC | PRN
  Administered 2020-07-06: 15 mL

## 2020-07-06 MED ORDER — LIDOCAINE HCL 1 % IJ SOLN
INTRAMUSCULAR | Status: AC
  Filled 2020-07-06: qty 20

## 2020-07-24 ENCOUNTER — Other Ambulatory Visit (INDEPENDENT_AMBULATORY_CARE_PROVIDER_SITE_OTHER): Payer: Medicare Other

## 2020-07-24 ENCOUNTER — Other Ambulatory Visit: Payer: Self-pay

## 2020-07-24 DIAGNOSIS — E039 Hypothyroidism, unspecified: Secondary | ICD-10-CM | POA: Diagnosis not present

## 2020-07-24 LAB — TSH: TSH: 3.25 u[IU]/mL (ref 0.35–4.50)

## 2020-07-31 MED FILL — NORMAL SALINE FLUSH SYRINGE: 0.9 | 60 days supply | Qty: 600 | Fill #3

## 2020-08-22 ENCOUNTER — Other Ambulatory Visit (HOSPITAL_BASED_OUTPATIENT_CLINIC_OR_DEPARTMENT_OTHER): Payer: Self-pay

## 2020-09-04 DIAGNOSIS — R208 Other disturbances of skin sensation: Secondary | ICD-10-CM | POA: Diagnosis not present

## 2020-09-04 DIAGNOSIS — D485 Neoplasm of uncertain behavior of skin: Secondary | ICD-10-CM | POA: Diagnosis not present

## 2020-09-04 DIAGNOSIS — C44329 Squamous cell carcinoma of skin of other parts of face: Secondary | ICD-10-CM | POA: Diagnosis not present

## 2020-09-07 ENCOUNTER — Other Ambulatory Visit: Payer: Self-pay

## 2020-09-07 DIAGNOSIS — E039 Hypothyroidism, unspecified: Secondary | ICD-10-CM

## 2020-09-07 MED ORDER — LEVOTHYROXINE SODIUM 50 MCG PO TABS: 50.0000 ug | ORAL_TABLET | Freq: Every day | ORAL | 1 refills | Status: DC

## 2020-09-11 DIAGNOSIS — C44329 Squamous cell carcinoma of skin of other parts of face: Secondary | ICD-10-CM | POA: Diagnosis not present

## 2020-09-25 ENCOUNTER — Other Ambulatory Visit (HOSPITAL_COMMUNITY): Payer: Self-pay

## 2020-09-25 MED FILL — Sodium Chloride Flush IV Soln 0.9%: INTRAVENOUS | 60 days supply | Qty: 600 | Fill #0 | Status: AC

## 2020-09-26 ENCOUNTER — Other Ambulatory Visit (HOSPITAL_COMMUNITY): Payer: Self-pay

## 2020-10-03 ENCOUNTER — Telehealth (HOSPITAL_COMMUNITY): Payer: Self-pay

## 2020-10-03 NOTE — Telephone Encounter (Signed)
Returned son's call to schedule, no answer, vm full. AW

## 2020-10-23 ENCOUNTER — Other Ambulatory Visit (HOSPITAL_COMMUNITY): Payer: Self-pay | Admitting: Interventional Radiology

## 2020-10-23 DIAGNOSIS — Z434 Encounter for attention to other artificial openings of digestive tract: Secondary | ICD-10-CM

## 2020-10-24 DIAGNOSIS — C44329 Squamous cell carcinoma of skin of other parts of face: Secondary | ICD-10-CM | POA: Diagnosis not present

## 2020-10-25 ENCOUNTER — Other Ambulatory Visit: Payer: Self-pay | Admitting: Radiology

## 2020-10-26 ENCOUNTER — Ambulatory Visit (HOSPITAL_COMMUNITY)
Admission: RE | Admit: 2020-10-26 | Discharge: 2020-10-26 | Disposition: A | Discharge status: Home / Self Care | Payer: Medicare Other | Source: Ambulatory Visit | Attending: Interventional Radiology | Admitting: Interventional Radiology

## 2020-10-26 ENCOUNTER — Other Ambulatory Visit: Payer: Self-pay

## 2020-10-26 ENCOUNTER — Other Ambulatory Visit (HOSPITAL_COMMUNITY): Payer: Self-pay | Admitting: Interventional Radiology

## 2020-10-26 DIAGNOSIS — Z434 Encounter for attention to other artificial openings of digestive tract: Secondary | ICD-10-CM | POA: Diagnosis not present

## 2020-10-26 DIAGNOSIS — K819 Cholecystitis, unspecified: Secondary | ICD-10-CM | POA: Insufficient documentation

## 2020-10-26 HISTORY — PX: IR EXCHANGE BILIARY DRAIN: IMG6046

## 2020-10-26 MED ORDER — IOHEXOL 300 MG/ML  SOLN
50.0000 mL | Freq: Once | INTRAMUSCULAR | Status: AC | PRN
  Administered 2020-10-26: 15 mL

## 2020-10-26 MED ORDER — LIDOCAINE HCL 1 % IJ SOLN
INTRAMUSCULAR | Status: AC
  Filled 2020-10-26: qty 20

## 2020-10-26 MED ORDER — LIDOCAINE HCL 1 % IJ SOLN
INTRAMUSCULAR | Status: DC | PRN
  Administered 2020-10-26: 10 mL

## 2020-10-26 NOTE — Procedures (Signed)
Interventional Radiology Procedure Note  Procedure: Cholecystostomy drain exchange  Indication: Cholecystitis  Findings: Please refer to procedural dictation for full description.  Complications: None  EBL: < 10 mL  Miachel Roux, MD [phone]

## 2020-11-07 ENCOUNTER — Telehealth: Payer: Self-pay | Admitting: Family Medicine

## 2020-11-07 DIAGNOSIS — E039 Hypothyroidism, unspecified: Secondary | ICD-10-CM

## 2020-11-07 MED ORDER — LEVOTHYROXINE SODIUM 50 MCG PO TABS: 50.0000 ug | ORAL_TABLET | Freq: Every day | ORAL | 1 refills | Status: DC

## 2020-11-07 NOTE — Telephone Encounter (Signed)
Patient is out of her levothyroxine (SYNTHROID) 50 MCG tablet and needs a refill.

## 2020-11-14 ENCOUNTER — Ambulatory Visit: Payer: Medicare Other | Admitting: Family Medicine

## 2020-11-15 ENCOUNTER — Other Ambulatory Visit: Payer: Self-pay

## 2020-11-15 ENCOUNTER — Ambulatory Visit (INDEPENDENT_AMBULATORY_CARE_PROVIDER_SITE_OTHER): Payer: Medicare Other | Admitting: Family Medicine

## 2020-11-15 DIAGNOSIS — G8929 Other chronic pain: Secondary | ICD-10-CM | POA: Diagnosis not present

## 2020-11-15 DIAGNOSIS — E782 Mixed hyperlipidemia: Secondary | ICD-10-CM

## 2020-11-15 DIAGNOSIS — E039 Hypothyroidism, unspecified: Secondary | ICD-10-CM | POA: Diagnosis not present

## 2020-11-15 DIAGNOSIS — Z8719 Personal history of other diseases of the digestive system: Secondary | ICD-10-CM

## 2020-11-15 DIAGNOSIS — M5440 Lumbago with sciatica, unspecified side: Secondary | ICD-10-CM

## 2020-11-15 DIAGNOSIS — I700 Atherosclerosis of aorta: Secondary | ICD-10-CM

## 2020-11-15 MED ORDER — LEVOTHYROXINE SODIUM 50 MCG PO TABS: 50.0000 ug | ORAL_TABLET | Freq: Every day | ORAL | 3 refills | Status: DC

## 2020-11-15 NOTE — Assessment & Plan Note (Signed)
This is a chronic issue that is relatively stable.  She can use a heating pad though I encouraged limited duration of use given risk of burning herself.

## 2020-11-15 NOTE — Assessment & Plan Note (Signed)
She will continue to see her specialist.  I encouraged continued discussion regarding removal of the drain.

## 2020-11-15 NOTE — Assessment & Plan Note (Signed)
Continue risk factor management.  Check lipid panel today.

## 2020-11-15 NOTE — Assessment & Plan Note (Signed)
Check lipid panel.  Continue Crestor 20 mg daily. 

## 2020-11-15 NOTE — Progress Notes (Signed)
Tommi Rumps, MD Phone: [phone]  Kylie May is a 87 y.o. female who presents today for f/u.  HYPOTHYROIDISM Disease Monitoring Weight changes: no  Skin Changes: no Heat/Cold intolerance: chronic cold intolerance  Medication Monitoring Compliance:  taking synthroid though ran out a week or so ago   Last TSH:   Lab Results  Component Value Date   TSH 3.25 07/24/2020   HYPERLIPIDEMIA Symptoms Chest pain on exertion:  no   Medications: Compliance- taking crestor Right upper quadrant pain- no  Muscle aches- no  Chronic back pain: The patient reports chronic back discomfort that only bothers her occasionally.  Does seem to bother her with certain movements and with sitting down.  She will use a heating pad with good benefit.  History of cholecystitis: She still has a drain in place.  Her grandson reports she continues to follow with her specialist regarding this and the family has not been able to come up with the decision to remove this.  Patient reports her lips are crusty and she has a bad taste in her mouth right after she wakes up.  Reports this resolves quickly.  Her grandson reports she sleeps with her mouth open.  Social History   Tobacco Use  Smoking Status Former   Pack years: 0.00   Types: Cigarettes   Quit date: 06/04/1951   Years since quitting: 69.4  Smokeless Tobacco Never  Tobacco Comments   Smoked 1945-1953 , up to 2 cigarettes/day    Current Outpatient Medications on File Prior to Visit  Medication Sig Dispense Refill   amiodarone (PACERONE) 200 MG tablet Take 1 tablet (200 mg total) by mouth daily. 90 tablet 3   apixaban (ELIQUIS) 2.5 MG TABS tablet Take 1 tablet (2.5 mg total) by mouth 2 (two) times daily. 180 tablet 3   cholecalciferol (VITAMIN D3) 25 MCG (1000 UNIT) tablet Take 1,000 Units by mouth daily.     ferrous sulfate 325 (65 FE) MG EC tablet Take 325 mg by mouth daily.     fluticasone (FLONASE) 50 MCG/ACT nasal spray Place 2  sprays into both nostrils daily. 16 g 6   magnesium chloride (SLOW-MAG) 64 MG TBEC SR tablet Take 2 tablets (128 mg total) by mouth daily. 60 tablet 3   metoprolol tartrate (LOPRESSOR) 25 MG tablet Take 1 tablet (25 mg total) by mouth 2 (two) times daily. 180 tablet 3   omeprazole (PRILOSEC) 40 MG capsule Take 1 capsule (40 mg total) by mouth daily. 90 capsule 3   rosuvastatin (CRESTOR) 20 MG tablet Take 1 tablet (20 mg total) by mouth at bedtime. 90 tablet 3   Sodium Chloride Flush (NORMAL SALINE FLUSH) 0.9 % SOLN FLUSH ONCE DAILY AS DIRECTED 600 mL 99   No current facility-administered medications on file prior to visit.     ROS see history of present illness  Objective  Physical Exam Vitals:   11/15/20 1616  BP: 140/70  Pulse: (!) 55  Temp: 98.3 F (36.8 C)    BP Readings from Last 3 Encounters:  11/15/20 140/70  04/21/20 137/68  10/12/19 124/64   Wt Readings from Last 3 Encounters:  11/15/20 99 lb 6.4 oz (45.1 kg)  06/23/20 99 lb (44.9 kg)  05/10/20 99 lb (44.9 kg)    Physical Exam Constitutional:      General: She is not in acute distress.    Appearance: She is not diaphoretic.  Cardiovascular:     Rate and Rhythm: Normal rate and regular rhythm.  Heart sounds: Normal heart sounds.  Pulmonary:     Effort: Pulmonary effort is normal.     Breath sounds: Normal breath sounds.  Abdominal:       Comments: Outlined areas where the drain tube is coming out of her abdomen, there are no surrounding signs of infection  Skin:    General: Skin is warm and dry.  Neurological:     Mental Status: She is alert.     Assessment/Plan: Please see individual problem list.  Problem List Items Addressed This Visit     Hyperlipidemia    Check lipid panel.  Continue Crestor 20 mg daily.       Relevant Orders   Comp Met (CMET)   Lipid panel   Chronic back pain    This is a chronic issue that is relatively stable.  She can use a heating pad though I encouraged  limited duration of use given risk of burning herself.       Abdominal aortic atherosclerosis ([place])    Continue risk factor management.  Check lipid panel today.       History of cholecystitis    She will continue to see her specialist.  I encouraged continued discussion regarding removal of the drain.       Hypothyroidism    Check TSH.  Refill Synthroid.       Relevant Medications   levothyroxine (SYNTHROID) 50 MCG tablet   Other Relevant Orders   TSH   I advised the patient that her sleeping with her mouth open is contributing to her bad taste in her mouth and her crusty lips.  If this becomes persistent she should let us know.   Return in about 6 months (around 05/17/2021).  This visit occurred during the SARS-CoV-2 public health emergency.  Safety protocols were in place, including screening questions prior to the visit, additional usage of staff PPE, and extensive cleaning of exam room while observing appropriate contact time as indicated for disinfecting solutions.    Tommi Rumps, MD [place]

## 2020-11-15 NOTE — Patient Instructions (Signed)
Nice to see you. I will refill your medicines. We will get lab work today and contact you with the results.

## 2020-11-15 NOTE — Assessment & Plan Note (Signed)
Check TSH.  Refill Synthroid.

## 2020-11-16 LAB — COMPREHENSIVE METABOLIC PANEL
ALT: 99 U/L — ABNORMAL HIGH (ref 0–35)
AST: 91 U/L — ABNORMAL HIGH (ref 0–37)
Albumin: 3.8 g/dL (ref 3.5–5.2)
Alkaline Phosphatase: 55 U/L (ref 39–117)
BUN: 12 mg/dL (ref 6–23)
CO2: 29 mEq/L (ref 19–32)
Calcium: 9.7 mg/dL (ref 8.4–10.5)
Chloride: 99 mEq/L (ref 96–112)
Creatinine, Ser: 1.01 mg/dL (ref 0.40–1.20)
GFR: 46.81 mL/min — ABNORMAL LOW (ref >60.00)
Glucose, Bld: 104 mg/dL — ABNORMAL HIGH (ref 70–99)
Potassium: 4.2 mEq/L (ref 3.5–5.1)
Sodium: 132 mEq/L — ABNORMAL LOW (ref 135–145)
Total Bilirubin: 0.5 mg/dL (ref 0.2–1.2)
Total Protein: 6.4 g/dL (ref 6.0–8.3)

## 2020-11-16 LAB — LIPID PANEL
Cholesterol: 112 mg/dL (ref 0–200)
HDL: 64.8 mg/dL (ref >39.00)
LDL Cholesterol: 33 mg/dL (ref 0–99)
NonHDL: 46.95
Total CHOL/HDL Ratio: 2
Triglycerides: 68 mg/dL (ref 0.0–149.0)
VLDL: 13.6 mg/dL (ref 0.0–40.0)

## 2020-11-16 LAB — TSH: TSH: 4.29 u[IU]/mL (ref 0.35–4.50)

## 2020-11-23 NOTE — Progress Notes (Signed)
Unable to leave message for patient to return call back for lab results. Vm box is full.

## 2020-11-28 ENCOUNTER — Other Ambulatory Visit (HOSPITAL_COMMUNITY): Payer: Self-pay

## 2020-11-28 MED FILL — Sodium Chloride Flush IV Soln 0.9%: INTRAVENOUS | 60 days supply | Qty: 600 | Fill #1 | Status: AC

## 2020-11-29 ENCOUNTER — Other Ambulatory Visit (HOSPITAL_COMMUNITY): Payer: Self-pay

## 2020-12-09 ENCOUNTER — Other Ambulatory Visit: Payer: Self-pay | Admitting: Family Medicine

## 2021-01-18 ENCOUNTER — Other Ambulatory Visit (HOSPITAL_COMMUNITY): Payer: Self-pay | Admitting: Hematology & Oncology

## 2021-01-18 ENCOUNTER — Ambulatory Visit (HOSPITAL_COMMUNITY)
Admission: RE | Admit: 2021-01-18 | Discharge: 2021-01-18 | Disposition: A | Discharge status: Home / Self Care | Payer: Medicare Other | Source: Ambulatory Visit | Attending: Interventional Radiology | Admitting: Interventional Radiology

## 2021-01-18 ENCOUNTER — Other Ambulatory Visit: Payer: Self-pay

## 2021-01-18 ENCOUNTER — Other Ambulatory Visit (HOSPITAL_COMMUNITY): Payer: Self-pay | Admitting: Interventional Radiology

## 2021-01-18 DIAGNOSIS — Z434 Encounter for attention to other artificial openings of digestive tract: Secondary | ICD-10-CM | POA: Diagnosis not present

## 2021-01-18 DIAGNOSIS — K819 Cholecystitis, unspecified: Secondary | ICD-10-CM

## 2021-01-18 HISTORY — PX: IR EXCHANGE BILIARY DRAIN: IMG6046

## 2021-01-18 MED ORDER — IOHEXOL 240 MG/ML SOLN
50.0000 mL | Freq: Once | INTRAMUSCULAR | Status: AC | PRN
  Administered 2021-01-18: 15 mL via INTRAVENOUS

## 2021-01-18 MED ORDER — LIDOCAINE HCL 1 % IJ SOLN
INTRAMUSCULAR | Status: AC
  Filled 2021-01-18: qty 20

## 2021-01-18 NOTE — Procedures (Signed)
Interventional Radiology Procedure Note  Procedure: Image guided drain exchange, perc chole.  90F pigtail drain.  Complications: None Recommendations: - Routine drain care      Signed,  Dulcy Fanny. Earleen Newport, DO

## 2021-01-29 ENCOUNTER — Other Ambulatory Visit (HOSPITAL_COMMUNITY): Payer: Self-pay

## 2021-01-29 ENCOUNTER — Other Ambulatory Visit: Payer: Self-pay

## 2021-01-29 MED ORDER — NORMAL SALINE FLUSH 0.9 % IV SOLN
INTRAVENOUS | 1 refills | Status: DC
  Filled 2021-01-29: qty 900, 90d supply, fill #0
  Filled 2021-04-30 (×2): qty 900, 90d supply, fill #1

## 2021-01-30 ENCOUNTER — Other Ambulatory Visit (HOSPITAL_COMMUNITY): Payer: Self-pay

## 2021-02-05 DIAGNOSIS — Z20822 Contact with and (suspected) exposure to covid-19: Secondary | ICD-10-CM | POA: Diagnosis not present

## 2021-02-13 DIAGNOSIS — C44329 Squamous cell carcinoma of skin of other parts of face: Secondary | ICD-10-CM | POA: Diagnosis not present

## 2021-02-13 DIAGNOSIS — D485 Neoplasm of uncertain behavior of skin: Secondary | ICD-10-CM | POA: Diagnosis not present

## 2021-02-13 DIAGNOSIS — D225 Melanocytic nevi of trunk: Secondary | ICD-10-CM | POA: Diagnosis not present

## 2021-02-13 DIAGNOSIS — L821 Other seborrheic keratosis: Secondary | ICD-10-CM | POA: Diagnosis not present

## 2021-02-13 DIAGNOSIS — L570 Actinic keratosis: Secondary | ICD-10-CM | POA: Diagnosis not present

## 2021-02-13 DIAGNOSIS — X32XXXA Exposure to sunlight, initial encounter: Secondary | ICD-10-CM | POA: Diagnosis not present

## 2021-03-05 ENCOUNTER — Other Ambulatory Visit: Payer: Self-pay

## 2021-03-05 ENCOUNTER — Other Ambulatory Visit (HOSPITAL_COMMUNITY): Payer: Self-pay | Admitting: Interventional Radiology

## 2021-03-05 ENCOUNTER — Ambulatory Visit (HOSPITAL_COMMUNITY)
Admission: RE | Admit: 2021-03-05 | Discharge: 2021-03-05 | Disposition: A | Discharge status: Home / Self Care | Payer: Medicare Other | Source: Ambulatory Visit | Attending: Interventional Radiology | Admitting: Interventional Radiology

## 2021-03-05 DIAGNOSIS — Z434 Encounter for attention to other artificial openings of digestive tract: Secondary | ICD-10-CM | POA: Insufficient documentation

## 2021-03-05 DIAGNOSIS — K819 Cholecystitis, unspecified: Secondary | ICD-10-CM

## 2021-03-05 HISTORY — PX: IR EXCHANGE BILIARY DRAIN: IMG6046

## 2021-03-05 MED ORDER — IOHEXOL 240 MG/ML SOLN
100.0000 mL | Freq: Once | INTRAMUSCULAR | Status: AC | PRN
  Administered 2021-03-05: 10 mL

## 2021-03-05 MED ORDER — LIDOCAINE HCL 1 % IJ SOLN
INTRAMUSCULAR | Status: AC
  Administered 2021-03-05: 1 mL via INTRADERMAL
  Filled 2021-03-05: qty 20

## 2021-03-05 NOTE — Procedures (Signed)
Interventional Radiology Procedure Note  Procedure: Replacement of cholecystostomy tube  Findings: Please refer to procedural dictation for full description.Successful recanalization of established track and replacement of 10.2 Fr cholecystostomy tube.  Complications: None immediate  Estimated Blood Loss: < 5 mL  Recommendations: Keep to bag drainage. Return in 12 weeks for routine check/exchange.   Ruthann Cancer, MD Pager: [phone]

## 2021-04-03 DIAGNOSIS — C44329 Squamous cell carcinoma of skin of other parts of face: Secondary | ICD-10-CM | POA: Diagnosis not present

## 2021-04-23 ENCOUNTER — Other Ambulatory Visit (HOSPITAL_COMMUNITY): Payer: Medicare Other

## 2021-04-30 ENCOUNTER — Other Ambulatory Visit (HOSPITAL_COMMUNITY): Payer: Self-pay

## 2021-05-17 ENCOUNTER — Other Ambulatory Visit: Payer: Self-pay

## 2021-05-17 ENCOUNTER — Encounter: Payer: Self-pay | Admitting: Adult Health

## 2021-05-17 ENCOUNTER — Ambulatory Visit (INDEPENDENT_AMBULATORY_CARE_PROVIDER_SITE_OTHER): Payer: Medicare Other | Admitting: Adult Health

## 2021-05-17 ENCOUNTER — Ambulatory Visit (INDEPENDENT_AMBULATORY_CARE_PROVIDER_SITE_OTHER): Payer: Medicare Other

## 2021-05-17 VITALS — BP 138/76 | HR 71 | Ht 64.02 in | Wt 98.6 lb

## 2021-05-17 DIAGNOSIS — R051 Acute cough: Secondary | ICD-10-CM

## 2021-05-17 DIAGNOSIS — U071 COVID-19: Secondary | ICD-10-CM | POA: Insufficient documentation

## 2021-05-17 DIAGNOSIS — R059 Cough, unspecified: Secondary | ICD-10-CM | POA: Diagnosis not present

## 2021-05-17 DIAGNOSIS — I5032 Chronic diastolic (congestive) heart failure: Secondary | ICD-10-CM

## 2021-05-17 LAB — COMPREHENSIVE METABOLIC PANEL
ALT: 106 U/L — ABNORMAL HIGH (ref 0–35)
AST: 103 U/L — ABNORMAL HIGH (ref 0–37)
Albumin: 3.5 g/dL (ref 3.5–5.2)
Alkaline Phosphatase: 59 U/L (ref 39–117)
BUN: 10 mg/dL (ref 6–23)
CO2: 27 mEq/L (ref 19–32)
Calcium: 9.4 mg/dL (ref 8.4–10.5)
Chloride: 96 mEq/L (ref 96–112)
Creatinine, Ser: 0.98 mg/dL (ref 0.40–1.20)
GFR: 48.37 mL/min — ABNORMAL LOW (ref >60.00)
Glucose, Bld: 126 mg/dL — ABNORMAL HIGH (ref 70–99)
Potassium: 3.9 mEq/L (ref 3.5–5.1)
Sodium: 130 mEq/L — ABNORMAL LOW (ref 135–145)
Total Bilirubin: 0.5 mg/dL (ref 0.2–1.2)
Total Protein: 6.3 g/dL (ref 6.0–8.3)

## 2021-05-17 LAB — POCT INFLUENZA A/B
Influenza A, POC: NEGATIVE
Influenza B, POC: NEGATIVE

## 2021-05-17 LAB — CBC WITH DIFFERENTIAL/PLATELET
Basophils Absolute: 0.1 10*3/uL (ref 0.0–0.1)
Basophils Relative: 0.7 % (ref 0.0–3.0)
Eosinophils Absolute: 0.1 10*3/uL (ref 0.0–0.7)
Eosinophils Relative: 1.1 % (ref 0.0–5.0)
HCT: 36.1 % (ref 36.0–46.0)
Hemoglobin: 12.3 g/dL (ref 12.0–15.0)
Lymphocytes Relative: 19.7 % (ref 12.0–46.0)
Lymphs Abs: 1.6 10*3/uL (ref 0.7–4.0)
MCHC: 34 g/dL (ref 30.0–36.0)
MCV: 93.9 fl (ref 78.0–100.0)
Monocytes Absolute: 0.8 10*3/uL (ref 0.1–1.0)
Monocytes Relative: 9.6 % (ref 3.0–12.0)
Neutro Abs: 5.7 10*3/uL (ref 1.4–7.7)
Neutrophils Relative %: 68.9 % (ref 43.0–77.0)
Platelets: 183 10*3/uL (ref 150.0–400.0)
RBC: 3.85 Mil/uL — ABNORMAL LOW (ref 3.87–5.11)
RDW: 12.7 % (ref 11.5–15.5)
WBC: 8.3 10*3/uL (ref 4.0–10.5)

## 2021-05-17 LAB — BRAIN NATRIURETIC PEPTIDE: Pro B Natriuretic peptide (BNP): 244 pg/mL — ABNORMAL HIGH (ref 0.0–100.0)

## 2021-05-17 LAB — POC COVID19 BINAXNOW: SARS Coronavirus 2 Ag: POSITIVE — AB

## 2021-05-17 MED ORDER — MOLNUPIRAVIR EUA 200MG CAPSULE: 4.0000 | ORAL_CAPSULE | Freq: Two times a day (BID) | ORAL | 0 refills | Status: AC

## 2021-05-17 NOTE — Progress Notes (Signed)
Positive for covid, patient and grandson aware in office, has a scheduled follow up with you in 1 week for health maintenance.

## 2021-05-17 NOTE — Patient Instructions (Addendum)
Molnupiravir Oral Capsules What is this medication? MOLNUPIRAVIR (mol nue pir a vir) treats COVID-19. It is an antiviral medication. It may decrease the risk of developing severe symptoms of COVID-19. It may also decrease the chance of going to the hospital. This medication is not approved by the FDA. The FDA has authorized emergency use of this medication during the COVID-19 pandemic. This medicine may be used for other purposes; ask your health care provider or pharmacist if you have questions. COMMON BRAND NAME(S): LAGEVRIO What should I tell my care team before I take this medication? They need to know if you have any of these conditions: Any allergies Any serious illness An unusual or allergic reaction to molnupiravir, other medications, foods, dyes, or preservatives Pregnant or trying to get pregnant Breast-feeding How should I use this medication? Take this medication by mouth with water. Take it as directed on the prescription label at the same time every day. Do not cut, crush or chew this medication. Swallow the capsules whole. You can take it with or without food. If it upsets your stomach, take it with food. Take all of this medication unless your care team tells you to stop it early. Keep taking it even if you think you are better. Talk to your care team about the use of this medication in children. Special care may be needed. Overdosage: If you think you have taken too much of this medicine contact a poison control center or emergency room at once. NOTE: This medicine is only for you. Do not share this medicine with others. What if I miss a dose? If you miss a dose, take it as soon as you can unless it is more than 10 hours late. If it is more than 10 hours late, skip the missed dose. Take the next dose at the normal time. Do not take extra or 2 doses at the same time to make up for the missed dose. What may interact with this medication? Interactions have not been studied. This  list may not describe all possible interactions. Give your health care provider a list of all the medicines, herbs, non-prescription drugs, or dietary supplements you use. Also tell them if you smoke, drink alcohol, or use illegal drugs. Some items may interact with your medicine. What should I watch for while using this medication? Your condition will be monitored carefully while you are receiving this medication. Visit your care team for regular checkups. Tell your care team if your symptoms do not start to get better or if they get worse. Do not become pregnant while taking this medication. You may need a pregnancy test before starting this medication. Women must use a reliable form of birth control while taking this medication and for 4 days after stopping the medication. Women should inform their care team if they wish to become pregnant or think they might be pregnant. Men should not father a child while taking this medication and for 3 months after stopping it. There is potential for serious harm to an unborn child. Talk to your care team for more information. Do not breast-feed an infant while taking this medication and for 4 days after stopping the medication. What side effects may I notice from receiving this medication? Side effects that you should report to your care team as soon as possible: Allergic reactions--skin rash, itching, hives, swelling of the face, lips, tongue, or throat Side effects that usually do not require medical attention (report these to your care team if they  continue or are bothersome): Diarrhea Dizziness Nausea This list may not describe all possible side effects. Call your doctor for medical advice about side effects. You may report side effects to FDA at 1-800-FDA-1088. Where should I keep my medication? Keep out of the reach of children and pets. Store at room temperature between 20 and 25 degrees C (68 and 77 degrees F). Get rid of any unused medication after the  expiration date. To get rid of medications that are no longer needed or have expired: Take the medication to a medication take-back program. Check with your pharmacy or law enforcement to find a location. If you cannot return the medication, check the label or package insert to see if the medication should be thrown out in the garbage or flushed down the toilet. If you are not sure, ask your care team. If it is safe to put it in the trash, take the medication out of the container. Mix the medication with cat litter, dirt, coffee grounds, or other unwanted substance. Seal the mixture in a bag or container. Put it in the trash. NOTE: This sheet is a summary. It may not cover all possible information. If you have questions about this medicine, talk to your doctor, pharmacist, or health care provider.  2022 Elsevier/Gold Standard (2020-05-29 00:00:00) 10 Things You Can Do to Manage Your COVID-19 Symptoms at Home If you have possible or confirmed COVID-19 Stay home except to get medical care. Monitor your symptoms carefully. If your symptoms get worse, call your healthcare provider immediately. Get rest and stay hydrated. If you have a medical appointment, call the healthcare provider ahead of time and tell them that you have or may have COVID-19. For medical emergencies, call 911 and notify the dispatch personnel that you have or may have COVID-19. Cover your cough and sneezes with a tissue or use the inside of your elbow. Wash your hands often with soap and water for at least 20 seconds or clean your hands with an alcohol-based hand sanitizer that contains at least 60% alcohol. As much as possible, stay in a specific room and away from other people in your home. Also, you should use a separate bathroom, if available. If you need to be around other people in or outside of the home, wear a mask. Avoid sharing personal items with other people in your household, like dishes, towels, and bedding. Clean all  surfaces that are touched often, like counters, tabletops, and doorknobs. Use household cleaning sprays or wipes according to the label instructions. [email or web address] 12/17/2019 This information is not intended to replace advice given to you by your health care provider. Make sure you discuss any questions you have with your health care provider. Document Revised: 02/09/2021 Document Reviewed: 02/09/2021 Elsevier Patient Education  [place].

## 2021-05-17 NOTE — Progress Notes (Signed)
Acute Office Visit  Subjective:    Patient ID: Kylie May, female    DOB: January 11, 1922, 87 y.o.   MRN: [id number]  Chief Complaint  Patient presents with   URI    Pt has symptoms of cough, sore throat and chest congestion. Symptoms started Monday.    URI  Associated symptoms include congestion, coughing and rhinorrhea. Pertinent negatives include no chest pain, ear pain or wheezing.  Patient is in today for onset of cough, that started 05/15/21. Cough,congestion. Afebrile. Sore throat.  Grandson present. Mild fatigue. Hard of hearing. Feels well ortherwise.   No new swelling in feet.   Patient  denies any fever, body aches,chills, rash, chest pain, shortness of breath, nausea, vomiting, or diarrhea.   Denies dizziness, lightheadedness, pre syncopal or syncopal episodes.     Past Medical History:  Diagnosis Date   A-fib ([place])    Acid reflux disease    Arrhythmia    Atrial fibrillation with RVR ([place]) 08/15/2018   Chronic diastolic CHF (congestive heart failure) (HCC)    Compression fracture of lumbar vertebra ([place])    Diverticulitis 01-2008   GI    Diverticulosis    Esophageal stricture 2006   Femoral bruit    Right   Fibrocystic breast disease    Hiatal hernia    History of esophageal stricture 06/14/2004   2006 esophageal dilation Schatzki's ring    History of skin cancer 12/29/2008   Basal cell cancer right glabella 07/10/2010 Dr. Evorn Gong, [facility] @ [place] , [place]    Hypercholesterolemia    [place] study LDL goal = < 160   Mitral regurgitation    a. 06/2014 EF 55-60%, elevated end-diastolic pressures, dilated LA at 4.3 cm, mildly dilated RA, severe mitral regurgitation, mild aortic sclerosis without stenosis, mod-severe TR   Mitral valve prolapse    Osteoporosis    Dr Matthew Saras   Pancreatitis 07-2008   Hospitalized    Patella fracture 05/13/2018   Schatzki's ring    Sepsis ([place]) 08/15/2018   Skin cancer    facial x 2. [name]    Past Surgical  History:  Procedure Laterality Date   BILIARY STENT PLACEMENT  08/21/2018   Procedure: BILIARY STENT PLACEMENT;  Surgeon: [name], MD;  Location: [facility] ENDOSCOPY;  Service: Gastroenterology;;   COLONOSCOPY  2006   Epidural steroids     x1   ERCP N/A 08/21/2018   Procedure: ENDOSCOPIC RETROGRADE CHOLANGIOPANCREATOGRAPHY (ERCP);  Surgeon: [name], MD;  Location: [place];  Service: Gastroenterology;  Laterality: N/A;   ESOPHAGEAL DILATION  2006   ESOPHAGOGASTRODUODENOSCOPY (EGD) WITH PROPOFOL N/A 11/16/2018   Procedure: ESOPHAGOGASTRODUODENOSCOPY (EGD) WITH PROPOFOL;  Surgeon: [name], MD;  Location: [facility] ENDOSCOPY;  Service: Gastroenterology;  Laterality: N/A;   G3 P4 (twins)     IR CHOLANGIOGRAM EXISTING TUBE  08/18/2018   IR EXCHANGE BILIARY DRAIN  11/03/2018   IR EXCHANGE BILIARY DRAIN  12/01/2018   IR EXCHANGE BILIARY DRAIN  05/13/2019   IR EXCHANGE BILIARY DRAIN  06/14/2019   IR EXCHANGE BILIARY DRAIN  07/19/2019   IR EXCHANGE BILIARY DRAIN  08/17/2019   IR EXCHANGE BILIARY DRAIN  09/30/2019   IR EXCHANGE BILIARY DRAIN  12/08/2019   IR EXCHANGE BILIARY DRAIN  01/19/2020   IR EXCHANGE BILIARY DRAIN  03/01/2020   IR EXCHANGE BILIARY DRAIN  04/20/2020   IR EXCHANGE BILIARY DRAIN  07/06/2020   IR EXCHANGE BILIARY DRAIN  10/26/2020   IR EXCHANGE BILIARY DRAIN  01/18/2021  IR EXCHANGE BILIARY DRAIN  03/05/2021   IR PERC CHOLECYSTOSTOMY  08/16/2018   REMOVAL OF STONES  08/21/2018   Procedure: REMOVAL OF STONES;  Surgeon: [name], MD;  Location: [place];  Service: Gastroenterology;;   SKIN CANCER EXCISION     SPHINCTEROTOMY  08/21/2018   Procedure: Joan Mayans;  Surgeon: [name], MD;  Location: [facility] ENDOSCOPY;  Service: Gastroenterology;;    Family History  Problem Relation Age of Onset   Breast cancer Maternal Aunt    Hepatitis Daughter        C   Lung disease Mother        lung tumor   Heart attack Daughter 52       S/P stents   Liver cancer Daughter    Heart  attack Sister 68   Diabetes Neg Hx    Stroke Neg Hx     Social History   Socioeconomic History   Marital status: Widowed    Spouse name: Not on file   Number of children: 3   Years of education: Not on file   Highest education level: Not on file  Occupational History   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  Tobacco Use   Smoking status: Former    Types: Cigarettes    Quit date: 06/04/1951    Years since quitting: 70.0   Smokeless tobacco: Never   Tobacco comments:    Smoked 1945-1953 , up to 2 cigarettes/day  Vaping Use   Vaping Use: Never used  Substance and Sexual Activity   Alcohol use: Yes    Alcohol/week: 1.0 standard drink    Types: 1 Glasses of wine per week    Comment: occassional   Drug use: No   Sexual activity: Not Currently  Other Topics Concern   Not on file  Social History Narrative   Has living will    Would want daughter Santiago Glad to be health care POA   Not sure about DNR--definitely doesn't want prolonged life support   Not sure about tube feeds either (but probably not)         Social Determinants of Health   Financial Resource Strain: Not on file  Food Insecurity: Not on file  Transportation Needs: Not on file  Physical Activity: Not on file  Stress: Not on file  Social Connections: Not on file  Intimate Partner Violence: Not on file    Outpatient Medications Prior to Visit  Medication Sig Dispense Refill   amiodarone (PACERONE) 200 MG tablet Take 1 tablet (200 mg total) by mouth daily. 90 tablet 3   apixaban (ELIQUIS) 2.5 MG TABS tablet Take 1 tablet (2.5 mg total) by mouth 2 (two) times daily. 180 tablet 3   cholecalciferol (VITAMIN D3) 25 MCG (1000 UNIT) tablet Take 1,000 Units by mouth daily.     ferrous sulfate 325 (65 FE) MG EC tablet Take 325 mg by mouth daily.     fluticasone (FLONASE) 50 MCG/ACT nasal spray USE 2 SPRAYS IN EACH NOSTRIL DAILY 16 g 11   levothyroxine (SYNTHROID) 50  MCG tablet Take 1 tablet (50 mcg total) by mouth daily. 90 tablet 3   magnesium chloride (SLOW-MAG) 64 MG TBEC SR tablet Take 2 tablets (128 mg total) by mouth daily. 60 tablet 3   metoprolol tartrate (LOPRESSOR) 25 MG tablet Take 1 tablet (25 mg total) by mouth 2 (two) times daily. 180 tablet 3   omeprazole (PRILOSEC) 40 MG capsule Take 1 capsule (40  mg total) by mouth daily. 90 capsule 3   rosuvastatin (CRESTOR) 20 MG tablet Take 1 tablet (20 mg total) by mouth at bedtime. 90 tablet 3   Sodium Chloride Flush (NORMAL SALINE FLUSH) 0.9 % SOLN FLUSH ONCE DAILY AS DIRECTED 900 mL 1   No facility-administered medications prior to visit.    Allergies  Allergen Reactions   Metronidazole     Other reaction(s): Dizziness and giddiness (finding), Other (qualifier value) made things feel like they were vibrating like the bed and floor,also caused mild dizziness weakness   Penicillin G Rash    Review of Systems  Constitutional:  Positive for fatigue. Negative for activity change, appetite change, chills, diaphoresis, fever and unexpected weight change.  HENT:  Positive for congestion, postnasal drip and rhinorrhea. Negative for ear discharge and ear pain.   Respiratory:  Positive for cough. Negative for apnea, choking, chest tightness, shortness of breath, wheezing and stridor.   Cardiovascular:  Negative for chest pain, palpitations and leg swelling.  Gastrointestinal: Negative.   Genitourinary: Negative.   Musculoskeletal: Negative.   Psychiatric/Behavioral: Negative.        Objective:    Physical Exam Vitals reviewed.  Constitutional:      General: She is not in acute distress.    Appearance: She is well-developed. She is not diaphoretic.     Interventions: She is not intubated.    Comments: Patient is alert and oriented and responsive to questions Engages in eye contact with provider. Speaks in full sentences without any pauses without any shortness of breath or distress.  Mild  congested cough in room.    HENT:     Head: Normocephalic and atraumatic.     Jaw: There is normal jaw occlusion.     Right Ear: External ear normal. A middle ear effusion is present. Tympanic membrane is not perforated or erythematous.     Left Ear: External ear normal. A middle ear effusion is present. Tympanic membrane is not perforated or erythematous.     Nose: Congestion and rhinorrhea present. No mucosal edema. Rhinorrhea is clear.     Right Turbinates: Not enlarged.     Left Turbinates: Not enlarged.     Right Sinus: No maxillary sinus tenderness or frontal sinus tenderness.     Left Sinus: No maxillary sinus tenderness or frontal sinus tenderness.     Mouth/Throat:     Lips: Pink.     Pharynx: Oropharynx is clear. No pharyngeal swelling, oropharyngeal exudate, posterior oropharyngeal erythema or uvula swelling.  Eyes:     General: Lids are normal. No scleral icterus.       Right eye: No discharge.        Left eye: No discharge.     Conjunctiva/sclera: Conjunctivae normal.     Right eye: Right conjunctiva is not injected. No exudate or hemorrhage.    Left eye: Left conjunctiva is not injected. No exudate or hemorrhage.    Pupils: Pupils are equal, round, and reactive to light.  Neck:     Thyroid: No thyroid mass or thyromegaly.     Vascular: Normal carotid pulses. No carotid bruit, hepatojugular reflux or JVD.     Trachea: Trachea and phonation normal. No tracheal tenderness or tracheal deviation.     Meningeal: Brudzinski's sign and Kernig's sign absent.  Cardiovascular:     Rate and Rhythm: Normal rate and regular rhythm.     Pulses:          Radial pulses are 2+ on the  right side and 2+ on the left side.       Dorsalis pedis pulses are 1+ on the right side and 1+ on the left side.       Posterior tibial pulses are 1+ on the right side and 1+ on the left side.     Heart sounds: Normal heart sounds, S1 normal and S2 normal. Heart sounds not distant. No murmur heard.   No  friction rub. No gallop.     Comments: Trace right lower extremity edema.  Pulmonary:     Effort: Pulmonary effort is normal. No tachypnea, bradypnea, accessory muscle usage or respiratory distress. She is not intubated.     Breath sounds: Normal breath sounds. No stridor. No wheezing, rhonchi or rales.  Chest:     Chest wall: No tenderness.     Comments: No adventitious ling sounds noted. Mild bronchial cough.  Abdominal:     General: Bowel sounds are normal. There is no distension or abdominal bruit.     Palpations: Abdomen is soft. There is no shifting dullness, fluid wave, hepatomegaly, splenomegaly, mass or pulsatile mass.     Tenderness: There is no abdominal tenderness. There is no guarding or rebound.     Hernia: No hernia is present.  Musculoskeletal:        General: No tenderness or deformity. Normal range of motion.     Cervical back: Full passive range of motion without pain, normal range of motion and neck supple. No edema, erythema or rigidity. No spinous process tenderness or muscular tenderness. Normal range of motion.     Left lower leg: 1+ Edema present.  Lymphadenopathy:     Head:     Right side of head: No submental, submandibular, tonsillar, preauricular, posterior auricular or occipital adenopathy.     Left side of head: No submental, submandibular, tonsillar, preauricular, posterior auricular or occipital adenopathy.     Cervical: No cervical adenopathy.     Right cervical: No superficial, deep or posterior cervical adenopathy.    Left cervical: No superficial, deep or posterior cervical adenopathy.     Upper Body:     Right upper body: No supraclavicular or pectoral adenopathy.     Left upper body: No supraclavicular or pectoral adenopathy.  Skin:    General: Skin is warm and dry.     Coloration: Skin is not pale.     Findings: No abrasion, bruising, burn, ecchymosis, erythema, lesion, petechiae or rash.     Nails: There is no clubbing.  Neurological:      Mental Status: She is alert and oriented to person, place, and time.     GCS: GCS eye subscore is 4. GCS verbal subscore is 5. GCS motor subscore is 6.     Cranial Nerves: No cranial nerve deficit.     Sensory: No sensory deficit.     Motor: No weakness, tremor, atrophy, abnormal muscle tone or seizure activity.     Coordination: Coordination normal.     Gait: Gait normal.     Deep Tendon Reflexes: Reflexes are normal and symmetric. Reflexes normal. Babinski sign absent on the right side. Babinski sign absent on the left side.     Reflex Scores:      Tricep reflexes are 2+ on the right side and 2+ on the left side.      Bicep reflexes are 2+ on the right side and 2+ on the left side.      Brachioradialis reflexes are 2+ on the right  side and 2+ on the left side.      Patellar reflexes are 2+ on the right side and 2+ on the left side.      Achilles reflexes are 2+ on the right side and 2+ on the left side. Psychiatric:        Speech: Speech normal.        Behavior: Behavior normal.        Thought Content: Thought content normal.        Judgment: Judgment normal.    BP 138/76    Pulse 71    Ht 5' 4.02" (1.626 m)    Wt 98 lb 9.6 oz (44.7 kg)    SpO2 98%    BMI 16.92 kg/m  Wt Readings from Last 3 Encounters:  05/17/21 98 lb 9.6 oz (44.7 kg)  11/15/20 99 lb 6.4 oz (45.1 kg)  06/23/20 99 lb (44.9 kg)    Health Maintenance Due  Topic Date Due   COVID-19 Vaccine (3 - Pfizer risk series) 08/16/2019   INFLUENZA VACCINE  01/01/2021    There are no preventive care reminders to display for this patient.   Lab Results  Component Value Date   TSH 4.29 11/15/2020   Lab Results  Component Value Date   WBC 8.8 09/17/2019   HGB 13.4 09/17/2019   HCT 40.2 09/17/2019   MCV 95.9 09/17/2019   PLT 239 09/17/2019   Lab Results  Component Value Date   NA 132 (L) 11/15/2020   K 4.2 11/15/2020   CO2 29 11/15/2020   GLUCOSE 104 (H) 11/15/2020   BUN 12 11/15/2020   CREATININE 1.01  11/15/2020   BILITOT 0.5 11/15/2020   ALKPHOS 55 11/15/2020   AST 91 (H) 11/15/2020   ALT 99 (H) 11/15/2020   PROT 6.4 11/15/2020   ALBUMIN 3.8 11/15/2020   CALCIUM 9.7 11/15/2020   ANIONGAP 6 08/26/2018   GFR 46.81 (L) 11/15/2020   Lab Results  Component Value Date   CHOL 112 11/15/2020   Lab Results  Component Value Date   HDL 64.80 11/15/2020   Lab Results  Component Value Date   LDLCALC 33 11/15/2020   Lab Results  Component Value Date   TRIG 68.0 11/15/2020   Lab Results  Component Value Date   CHOLHDL 2 11/15/2020   No results found for: HGBA1C     Assessment & Plan:   Problem List Items Addressed This Visit       Cardiovascular and Mediastinum   Chronic diastolic CHF (congestive heart failure) (HCC)   Relevant Orders   B Nat Peptide     Other   Lab test positive for detection of COVID-19 virus   Acute cough - Primary   Relevant Medications   molnupiravir EUA (LAGEVRIO) 200 mg CAPS capsule   Other Relevant Orders   POC COVID-19 (Completed)   POCT Influenza A/B (Completed)   DG Chest 2 View   CBC with Differential/Platelet   Comprehensive metabolic panel   Discussed FDA experimental use  for covid with following antiviral patient and grandson in agreement for treatment. Given age will check CBC and CMP today as well as chest x ray. No signs or symptoms of bacterial infection at this time. Discussed covid is viral infection and to watch for signs of secondary infection. Urgent care / emergency room at anytime if any symptoms worsen or change.  Will also check BNP given CHF she has no signs of fluid overload.  Meds ordered this encounter  Medications   molnupiravir EUA (LAGEVRIO) 200 mg CAPS capsule    Sig: Take 4 capsules (800 mg total) by mouth 2 (two) times daily for 5 days.    Dispense:  40 capsule    Refill:  0    Has follow up with [name], MD next week.    Red Flags discussed. The patient was given clear instructions to go to  ER or return to medical center if any red flags develop, symptoms do not improve, worsen or new problems develop. They verbalized understanding.'  Return in about 1 week (around 05/24/2021), or if symptoms worsen or fail to improve, for at any time for any worsening symptoms, Go to Emergency room/ urgent care if worse.   Marcille Buffy, FNP

## 2021-05-18 ENCOUNTER — Telehealth: Payer: Self-pay | Admitting: Cardiovascular Disease

## 2021-05-18 NOTE — Telephone Encounter (Signed)
Patient need to schedule an appointment for refills. She has not been seen since 04/2020

## 2021-05-18 NOTE — Telephone Encounter (Signed)
°*  STAT* If patient is at the pharmacy, call can be transferred to refill team.   1. Which medications need to be refilled? (please list name of each medication and dose if known) Eliquis 2.5mg  1 tablet twice a day, Rosuvastatin 20mg  1 tablet at bedtime, Lopressor 25mg , 1 tablet twice a day, Amidarone 200mg  1 tablet daily  2. Which pharmacy/location (including street and city if local pharmacy) is medication to be sent to? [facility]  3. Do they need a 30 day or 90 day supply? 90 day

## 2021-05-18 NOTE — Progress Notes (Signed)
CBC and CMP are stable decreased kidney function however is stable.  BNP was slightly elevated, no significant concerns please keep follow-up that you have already scheduled with Dr. Caryl Bis in 1 week for follow-up.  Return to the office sooner should any symptoms change persist or worsen anytime.

## 2021-05-18 NOTE — Progress Notes (Signed)
Chest x-ray shows similar appearance to the last chest x-ray that the patient previously had with no definitive evidence of acute cardiopulmonary disease.  No signs of infection.  Please keep follow-up with Dr. Caryl Bis for routine health maintenance and follow-up.

## 2021-05-20 DIAGNOSIS — Z20822 Contact with and (suspected) exposure to covid-19: Secondary | ICD-10-CM | POA: Diagnosis not present

## 2021-05-21 ENCOUNTER — Encounter: Payer: Self-pay | Admitting: Family Medicine

## 2021-05-21 ENCOUNTER — Ambulatory Visit (HOSPITAL_COMMUNITY): Admission: RE | Admit: 2021-05-21 | Payer: TRICARE For Life (TFL) | Source: Ambulatory Visit

## 2021-05-21 ENCOUNTER — Other Ambulatory Visit: Payer: Self-pay

## 2021-05-21 ENCOUNTER — Telehealth (INDEPENDENT_AMBULATORY_CARE_PROVIDER_SITE_OTHER): Payer: Medicare Other | Admitting: Family Medicine

## 2021-05-21 VITALS — Ht 64.0 in | Wt 98.0 lb

## 2021-05-21 DIAGNOSIS — I480 Paroxysmal atrial fibrillation: Secondary | ICD-10-CM

## 2021-05-21 DIAGNOSIS — E039 Hypothyroidism, unspecified: Secondary | ICD-10-CM | POA: Diagnosis not present

## 2021-05-21 DIAGNOSIS — R7989 Other specified abnormal findings of blood chemistry: Secondary | ICD-10-CM

## 2021-05-21 DIAGNOSIS — Z8719 Personal history of other diseases of the digestive system: Secondary | ICD-10-CM | POA: Diagnosis not present

## 2021-05-21 DIAGNOSIS — U071 COVID-19: Secondary | ICD-10-CM

## 2021-05-21 DIAGNOSIS — E871 Hypo-osmolality and hyponatremia: Secondary | ICD-10-CM | POA: Diagnosis not present

## 2021-05-21 MED ORDER — DOXYCYCLINE HYCLATE 100 MG PO TABS: 100.0000 mg | ORAL_TABLET | Freq: Two times a day (BID) | ORAL | 0 refills | Status: DC

## 2021-05-21 MED ORDER — BENZONATATE 200 MG PO CAPS
200.0000 mg | ORAL_CAPSULE | Freq: Two times a day (BID) | ORAL | 0 refills | Status: DC | PRN
Start: 2021-05-21 — End: 2022-03-29

## 2021-05-21 NOTE — Assessment & Plan Note (Signed)
Continue Synthroid 50 mcg once daily.

## 2021-05-21 NOTE — Assessment & Plan Note (Signed)
They will see the specialist later this week for this.

## 2021-05-21 NOTE — Assessment & Plan Note (Signed)
Recheck in 2 weeks 

## 2021-05-21 NOTE — Assessment & Plan Note (Signed)
Discussed the patient was overdue for follow-up with cardiology.  I encouraged them to contact cardiology to schedule follow-up.

## 2021-05-21 NOTE — Progress Notes (Signed)
Virtual Visit via telephone Note  This visit type was conducted due to national recommendations for restrictions regarding the COVID-19 pandemic (e.g. social distancing).  This format is felt to be most appropriate for this patient at this time.  All issues noted in this document were discussed and addressed.  No physical exam was performed (except for noted visual exam findings with Video Visits).   I connected with Dorien Chihuahua today at  4:00 PM EST by telephone and verified that I am speaking with the correct person using two identifiers. Location patient: home Location provider: work Persons participating in the virtual visit: patient, provider, Hulen Skains, grandson)  I discussed the limitations, risks, security and privacy concerns of performing an evaluation and management service by telephone and the availability of in person appointments. I also discussed with the patient that there may be a patient responsible charge related to this service. The patient expressed understanding and agreed to proceed.  Interactive audio and video telecommunications were attempted between this provider and patient, however failed, due to patient having technical difficulties OR patient did not have access to video capability.  We continued and completed visit with audio only.   Reason for visit: f/u.   HPI: Respiratory illness: Symptoms started on 05/14/2021.  Patient had a positive point-of-care COVID test in the office on 05/17/2021.  The patient's grandson is concerned about this result as she had 3 negative home COVID test shortly after having this positive test and no one else in the household seems to be getting ill.  He took her for a PCR test yesterday though they have not received the results.  She has been taking the molnupiravir.  She has not had any fevers.  She has had congestion and cough that is productive that is not improving.  There is some discoloration to the production.  No shortness of  breath.  She had a chest x-ray on 12/15 that did not reveal any acute changes.  Her pro BNP was age-appropriate.  Elevated LFTs/cholecystectomy tube: Patient still has this tube in place.  They were supposed to follow-up today on this.  Her liver enzymes have trended up very slightly.  They have rescheduled her for Friday.  Hypothyroidism: She is taking Synthroid.  Hyponatremia: Noted to be mildly low on lab work.   ROS: See pertinent positives and negatives per HPI.  Past Medical History:  Diagnosis Date   A-fib ([place])    Acid reflux disease    Arrhythmia    Atrial fibrillation with RVR ([place]) 08/15/2018   Chronic diastolic CHF (congestive heart failure) (HCC)    Compression fracture of lumbar vertebra ([place])    Diverticulitis 01-2008   [facility] [place]   Diverticulosis    Esophageal stricture 2006   Femoral bruit    Right   Fibrocystic breast disease    Hiatal hernia    History of esophageal stricture 06/14/2004   2006 esophageal dilation Schatzki's ring    History of skin cancer 12/29/2008   Basal cell cancer right glabella 07/10/2010 Dr. Evorn Gong, [facility] @ [place] , [place]    Hypercholesterolemia    [place] study LDL goal = < 160   Mitral regurgitation    a. 06/2014 EF 55-60%, elevated end-diastolic pressures, dilated LA at 4.3 cm, mildly dilated RA, severe mitral regurgitation, mild aortic sclerosis without stenosis, mod-severe TR   Mitral valve prolapse    Osteoporosis    Dr Matthew Saras   Pancreatitis 07-2008   Hospitalized    Patella fracture 05/13/2018  Schatzki's ring    Sepsis ([place]) 08/15/2018   Skin cancer    facial x 2. [name]    Past Surgical History:  Procedure Laterality Date   BILIARY STENT PLACEMENT  08/21/2018   Procedure: BILIARY STENT PLACEMENT;  Surgeon: [name], MD;  Location: [facility] ENDOSCOPY;  Service: Gastroenterology;;   COLONOSCOPY  2006   Epidural steroids     x1   ERCP N/A 08/21/2018   Procedure: ENDOSCOPIC RETROGRADE CHOLANGIOPANCREATOGRAPHY (ERCP);   Surgeon: [name], MD;  Location: [place];  Service: Gastroenterology;  Laterality: N/A;   ESOPHAGEAL DILATION  2006   ESOPHAGOGASTRODUODENOSCOPY (EGD) WITH PROPOFOL N/A 11/16/2018   Procedure: ESOPHAGOGASTRODUODENOSCOPY (EGD) WITH PROPOFOL;  Surgeon: [name], MD;  Location: [facility] ENDOSCOPY;  Service: Gastroenterology;  Laterality: N/A;   G3 P4 (twins)     IR CHOLANGIOGRAM EXISTING TUBE  08/18/2018   IR EXCHANGE BILIARY DRAIN  11/03/2018   IR EXCHANGE BILIARY DRAIN  12/01/2018   IR EXCHANGE BILIARY DRAIN  05/13/2019   IR EXCHANGE BILIARY DRAIN  06/14/2019   IR EXCHANGE BILIARY DRAIN  07/19/2019   IR EXCHANGE BILIARY DRAIN  08/17/2019   IR EXCHANGE BILIARY DRAIN  09/30/2019   IR EXCHANGE BILIARY DRAIN  12/08/2019   IR EXCHANGE BILIARY DRAIN  01/19/2020   IR EXCHANGE BILIARY DRAIN  03/01/2020   IR EXCHANGE BILIARY DRAIN  04/20/2020   IR EXCHANGE BILIARY DRAIN  07/06/2020   IR EXCHANGE BILIARY DRAIN  10/26/2020   IR EXCHANGE BILIARY DRAIN  01/18/2021   IR EXCHANGE BILIARY DRAIN  03/05/2021   IR PERC CHOLECYSTOSTOMY  08/16/2018   REMOVAL OF STONES  08/21/2018   Procedure: REMOVAL OF STONES;  Surgeon: [name], MD;  Location: [place];  Service: Gastroenterology;;   SKIN CANCER EXCISION     SPHINCTEROTOMY  08/21/2018   Procedure: Joan Mayans;  Surgeon: [name], MD;  Location: [facility] ENDOSCOPY;  Service: Gastroenterology;;    Family History  Problem Relation Age of Onset   Breast cancer Maternal Aunt    Hepatitis Daughter        C   Lung disease Mother        lung tumor   Heart attack Daughter 72       S/P stents   Liver cancer Daughter    Heart attack Sister 40   Diabetes Neg Hx    Stroke Neg Hx     SOCIAL HX: Former smoker   Current Outpatient Medications:    amiodarone (PACERONE) 200 MG tablet, Take 1 tablet (200 mg total) by mouth daily., Disp: 90 tablet, Rfl: 3   apixaban (ELIQUIS) 2.5 MG TABS tablet, Take 1 tablet (2.5 mg total) by mouth 2 (two) times daily.,  Disp: 180 tablet, Rfl: 3   benzonatate (TESSALON) 200 MG capsule, Take 1 capsule (200 mg total) by mouth 2 (two) times daily as needed for cough., Disp: 20 capsule, Rfl: 0   cholecalciferol (VITAMIN D3) 25 MCG (1000 UNIT) tablet, Take 1,000 Units by mouth daily., Disp: , Rfl:    doxycycline (VIBRA-TABS) 100 MG tablet, Take 1 tablet (100 mg total) by mouth 2 (two) times daily., Disp: 14 tablet, Rfl: 0   ferrous sulfate 325 (65 FE) MG EC tablet, Take 325 mg by mouth daily., Disp: , Rfl:    fluticasone (FLONASE) 50 MCG/ACT nasal spray, USE 2 SPRAYS IN EACH NOSTRIL DAILY, Disp: 16 g, Rfl: 11   levothyroxine (SYNTHROID) 50 MCG tablet, Take 1 tablet (50 mcg total) by mouth daily., Disp:  90 tablet, Rfl: 3   magnesium chloride (SLOW-MAG) 64 MG TBEC SR tablet, Take 2 tablets (128 mg total) by mouth daily., Disp: 60 tablet, Rfl: 3   metoprolol tartrate (LOPRESSOR) 25 MG tablet, Take 1 tablet (25 mg total) by mouth 2 (two) times daily., Disp: 180 tablet, Rfl: 3   molnupiravir EUA (LAGEVRIO) 200 mg CAPS capsule, Take 4 capsules (800 mg total) by mouth 2 (two) times daily for 5 days., Disp: 40 capsule, Rfl: 0   omeprazole (PRILOSEC) 40 MG capsule, Take 1 capsule (40 mg total) by mouth daily., Disp: 90 capsule, Rfl: 3   rosuvastatin (CRESTOR) 20 MG tablet, Take 1 tablet (20 mg total) by mouth at bedtime., Disp: 90 tablet, Rfl: 3   Sodium Chloride Flush (NORMAL SALINE FLUSH) 0.9 % SOLN, FLUSH ONCE DAILY AS DIRECTED, Disp: 900 mL, Rfl: 1  EXAM: This was a telephone visit and thus no exam was completed.  ASSESSMENT AND PLAN:  Discussed the following assessment and plan:  Problem List Items Addressed This Visit     Elevated LFTs    Possibly related to the drain still being in place several years after it was placed.  They will see the specialist for this later this week.      History of cholecystitis    They will see the specialist later this week for this.      Hyponatremia    Recheck in 2 weeks.       Relevant Orders   Basic Metabolic Panel (BMET)   Hypothyroidism    Continue Synthroid 50 mcg once daily.      Lab test positive for detection of COVID-19 virus - Primary    Patient has had respiratory symptoms for the past week.  She did have a positive point-of-care COVID test in the office though has had numerous negative point-of-care COVID test at home.  Advised that she should continue with the antiviral medication at this time.  They will update [place] on the COVID PCR result.  Discussed remaining quarantined through 05/24/2021.  Discussed given the duration of her illness and no significant improvement we will proceed with antibiotic treatment for possible underlying bronchitis.  Doxycycline sent to the pharmacy for this.  Discussed if she is not improving over the next few days we could consider repeat chest x-ray later this week.  If she develops chest pain, shortness of breath, cough productive of blood, or significantly worsening symptoms I will have her evaluated in the emergency department.      Paroxysmal atrial fibrillation [facility])    Discussed the patient was overdue for follow-up with cardiology.  I encouraged them to contact cardiology to schedule follow-up.       Return in about 2 weeks (around 06/04/2021) for Labs, 6 months PCP.  The patient's grandson was advised to contact us if he has not heard from anybody regarding the follow-up lab appointment.   I discussed the assessment and treatment plan with the patient. The patient was provided an opportunity to ask questions and all were answered. The patient agreed with the plan and demonstrated an understanding of the instructions.   The patient was advised to call back or seek an in-person evaluation if the symptoms worsen or if the condition fails to improve as anticipated.  I provided 28 minutes of non-face-to-face time during this encounter.   Tommi Rumps, MD

## 2021-05-21 NOTE — Assessment & Plan Note (Signed)
Possibly related to the drain still being in place several years after it was placed.  They will see the specialist for this later this week.

## 2021-05-21 NOTE — Assessment & Plan Note (Signed)
Patient has had respiratory symptoms for the past week.  She did have a positive point-of-care COVID test in the office though has had numerous negative point-of-care COVID test at home.  Advised that she should continue with the antiviral medication at this time.  They will update [place] on the COVID PCR result.  Discussed remaining quarantined through 05/24/2021.  Discussed given the duration of her illness and no significant improvement we will proceed with antibiotic treatment for possible underlying bronchitis.  Doxycycline sent to the pharmacy for this.  Discussed if she is not improving over the next few days we could consider repeat chest x-ray later this week.  If she develops chest pain, shortness of breath, cough productive of blood, or significantly worsening symptoms I will have her evaluated in the emergency department.

## 2021-05-21 NOTE — Telephone Encounter (Signed)
Attempted to schedule.  

## 2021-05-22 ENCOUNTER — Emergency Department: Payer: Medicare Other

## 2021-05-22 ENCOUNTER — Encounter: Payer: Self-pay | Admitting: Emergency Medicine

## 2021-05-22 ENCOUNTER — Inpatient Hospital Stay
Admission: EM | Admit: 2021-05-22 | Discharge: 2021-05-26 | DRG: 314 | Disposition: A | Discharge status: Home Health | Payer: Medicare Other | Attending: Internal Medicine | Admitting: Internal Medicine

## 2021-05-22 ENCOUNTER — Other Ambulatory Visit: Payer: Self-pay

## 2021-05-22 DIAGNOSIS — F17210 Nicotine dependence, cigarettes, uncomplicated: Secondary | ICD-10-CM | POA: Diagnosis present

## 2021-05-22 DIAGNOSIS — Z434 Encounter for attention to other artificial openings of digestive tract: Secondary | ICD-10-CM | POA: Diagnosis not present

## 2021-05-22 DIAGNOSIS — G9341 Metabolic encephalopathy: Secondary | ICD-10-CM | POA: Diagnosis present

## 2021-05-22 DIAGNOSIS — R6510 Systemic inflammatory response syndrome (SIRS) of non-infectious origin without acute organ dysfunction: Secondary | ICD-10-CM

## 2021-05-22 DIAGNOSIS — Z888 Allergy status to other drugs, medicaments and biological substances status: Secondary | ICD-10-CM

## 2021-05-22 DIAGNOSIS — K219 Gastro-esophageal reflux disease without esophagitis: Secondary | ICD-10-CM | POA: Diagnosis present

## 2021-05-22 DIAGNOSIS — W19XXXA Unspecified fall, initial encounter: Secondary | ICD-10-CM | POA: Diagnosis not present

## 2021-05-22 DIAGNOSIS — T148XXA Other injury of unspecified body region, initial encounter: Secondary | ICD-10-CM | POA: Diagnosis not present

## 2021-05-22 DIAGNOSIS — S62609A Fracture of unspecified phalanx of unspecified finger, initial encounter for closed fracture: Secondary | ICD-10-CM | POA: Diagnosis present

## 2021-05-22 DIAGNOSIS — E222 Syndrome of inappropriate secretion of antidiuretic hormone: Secondary | ICD-10-CM | POA: Diagnosis present

## 2021-05-22 DIAGNOSIS — R918 Other nonspecific abnormal finding of lung field: Secondary | ICD-10-CM | POA: Diagnosis not present

## 2021-05-22 DIAGNOSIS — I700 Atherosclerosis of aorta: Secondary | ICD-10-CM | POA: Diagnosis present

## 2021-05-22 DIAGNOSIS — E878 Other disorders of electrolyte and fluid balance, not elsewhere classified: Secondary | ICD-10-CM | POA: Diagnosis present

## 2021-05-22 DIAGNOSIS — S199XXA Unspecified injury of neck, initial encounter: Secondary | ICD-10-CM | POA: Diagnosis not present

## 2021-05-22 DIAGNOSIS — M79632 Pain in left forearm: Secondary | ICD-10-CM | POA: Insufficient documentation

## 2021-05-22 DIAGNOSIS — I9589 Other hypotension: Secondary | ICD-10-CM | POA: Diagnosis not present

## 2021-05-22 DIAGNOSIS — I341 Nonrheumatic mitral (valve) prolapse: Secondary | ICD-10-CM | POA: Diagnosis present

## 2021-05-22 DIAGNOSIS — I482 Chronic atrial fibrillation, unspecified: Secondary | ICD-10-CM | POA: Diagnosis not present

## 2021-05-22 DIAGNOSIS — Z79890 Hormone replacement therapy (postmenopausal): Secondary | ICD-10-CM

## 2021-05-22 DIAGNOSIS — S62641A Nondisplaced fracture of proximal phalanx of left index finger, initial encounter for closed fracture: Secondary | ICD-10-CM | POA: Diagnosis not present

## 2021-05-22 DIAGNOSIS — Z79899 Other long term (current) drug therapy: Secondary | ICD-10-CM

## 2021-05-22 DIAGNOSIS — H9190 Unspecified hearing loss, unspecified ear: Secondary | ICD-10-CM | POA: Diagnosis present

## 2021-05-22 DIAGNOSIS — I5032 Chronic diastolic (congestive) heart failure: Secondary | ICD-10-CM | POA: Diagnosis present

## 2021-05-22 DIAGNOSIS — S0990XA Unspecified injury of head, initial encounter: Secondary | ICD-10-CM

## 2021-05-22 DIAGNOSIS — R9431 Abnormal electrocardiogram [ECG] [EKG]: Secondary | ICD-10-CM | POA: Diagnosis not present

## 2021-05-22 DIAGNOSIS — S42202A Unspecified fracture of upper end of left humerus, initial encounter for closed fracture: Secondary | ICD-10-CM | POA: Diagnosis not present

## 2021-05-22 DIAGNOSIS — S3991XA Unspecified injury of abdomen, initial encounter: Secondary | ICD-10-CM | POA: Diagnosis not present

## 2021-05-22 DIAGNOSIS — M419 Scoliosis, unspecified: Secondary | ICD-10-CM | POA: Diagnosis not present

## 2021-05-22 DIAGNOSIS — E039 Hypothyroidism, unspecified: Secondary | ICD-10-CM | POA: Diagnosis present

## 2021-05-22 DIAGNOSIS — Z85828 Personal history of other malignant neoplasm of skin: Secondary | ICD-10-CM

## 2021-05-22 DIAGNOSIS — S299XXA Unspecified injury of thorax, initial encounter: Secondary | ICD-10-CM | POA: Diagnosis not present

## 2021-05-22 DIAGNOSIS — A419 Sepsis, unspecified organism: Secondary | ICD-10-CM

## 2021-05-22 DIAGNOSIS — M810 Age-related osteoporosis without current pathological fracture: Secondary | ICD-10-CM | POA: Diagnosis present

## 2021-05-22 DIAGNOSIS — E7800 Pure hypercholesterolemia, unspecified: Secondary | ICD-10-CM | POA: Diagnosis present

## 2021-05-22 DIAGNOSIS — Z23 Encounter for immunization: Secondary | ICD-10-CM

## 2021-05-22 DIAGNOSIS — S01511A Laceration without foreign body of lip, initial encounter: Secondary | ICD-10-CM | POA: Diagnosis not present

## 2021-05-22 DIAGNOSIS — G319 Degenerative disease of nervous system, unspecified: Secondary | ICD-10-CM | POA: Diagnosis not present

## 2021-05-22 DIAGNOSIS — Z20822 Contact with and (suspected) exposure to covid-19: Secondary | ICD-10-CM | POA: Diagnosis present

## 2021-05-22 DIAGNOSIS — S01511S Laceration without foreign body of lip, sequela: Secondary | ICD-10-CM | POA: Diagnosis not present

## 2021-05-22 DIAGNOSIS — Z8249 Family history of ischemic heart disease and other diseases of the circulatory system: Secondary | ICD-10-CM

## 2021-05-22 DIAGNOSIS — I959 Hypotension, unspecified: Secondary | ICD-10-CM | POA: Diagnosis not present

## 2021-05-22 DIAGNOSIS — E871 Hypo-osmolality and hyponatremia: Secondary | ICD-10-CM | POA: Diagnosis not present

## 2021-05-22 DIAGNOSIS — D62 Acute posthemorrhagic anemia: Secondary | ICD-10-CM | POA: Diagnosis present

## 2021-05-22 DIAGNOSIS — Z7901 Long term (current) use of anticoagulants: Secondary | ICD-10-CM

## 2021-05-22 DIAGNOSIS — E861 Hypovolemia: Secondary | ICD-10-CM | POA: Diagnosis not present

## 2021-05-22 DIAGNOSIS — Z882 Allergy status to sulfonamides status: Secondary | ICD-10-CM

## 2021-05-22 MED ORDER — SODIUM CHLORIDE 0.9 % IV BOLUS: 500.0000 mL | Freq: Once | INTRAVENOUS | Status: DC

## 2021-05-22 MED ORDER — TETANUS-DIPHTHERIA TOXOIDS TD 5-2 LFU IM INJ
0.5000 mL | INJECTION | Freq: Once | INTRAMUSCULAR | Status: AC
  Administered 2021-05-22: 21:00:00 0.5 mL via INTRAMUSCULAR
  Filled 2021-05-22: qty 0.5

## 2021-05-22 MED ORDER — SODIUM CHLORIDE 0.9 % IV BOLUS
1000.0000 mL | Freq: Once | INTRAVENOUS | Status: AC
  Administered 2021-05-23: 01:00:00 1000 mL via INTRAVENOUS

## 2021-05-22 NOTE — ED Provider Notes (Signed)
[facility]  ____________________________________________   Event Date/Time   First MD Initiated Contact with Patient 05/22/21 2056     (approximate)  I have reviewed the triage vital signs and the nursing notes.   HISTORY  Chief Complaint Laceration    HPI Kylie May is a 87 y.o. female with past medical history of atrial fibrillation on Eliquis, heart failure, hyperlipidemia, who presents after a fall.  This occurred today that was unwitnessed fall in the bathroom.  Patient was evaluated at [facility] and diagnosed with a proximal left humerus fracture as well as a finger fracture.  She was noted to have a laceration on the lip but nothing was done for it.  They then went to urgent care but urgent care told him to come to the emergency department.  Patient has been acting normally since the fall.  No nausea or vomiting.  She has pain in the left shoulder denies chest pain abdominal pain.  She is on Eliquis.         Past Medical History:  Diagnosis Date   A-fib ([place])    Acid reflux disease    Arrhythmia    Atrial fibrillation with RVR ([place]) 08/15/2018   Chronic diastolic CHF (congestive heart failure) (HCC)    Compression fracture of lumbar vertebra ([place])    Diverticulitis 01-2008   GI [place]   Diverticulosis    Esophageal stricture 2006   Femoral bruit    Right   Fibrocystic breast disease    Hiatal hernia    History of esophageal stricture 06/14/2004   2006 esophageal dilation Schatzki's ring    History of skin cancer 12/29/2008   Basal cell cancer right glabella 07/10/2010 Dr. Evorn Gong, [facility] @ [place] , [place]    Hypercholesterolemia    [place] study LDL goal = < 160   Mitral regurgitation    a. 06/2014 EF 55-60%, elevated end-diastolic pressures, dilated LA at 4.3 cm, mildly dilated RA, severe mitral regurgitation, mild aortic sclerosis without stenosis, mod-severe TR   Mitral valve prolapse    Osteoporosis    Dr Matthew Saras    Pancreatitis 07-2008   Hospitalized    Patella fracture 05/13/2018   Schatzki's ring    Sepsis ([place]) 08/15/2018   Skin cancer    facial x 2. Dr Dasher    Patient Active Problem List   Diagnosis Date Noted   Hyponatremia 05/21/2021   Lab test positive for detection of COVID-19 virus 05/17/2021   Acute cough 05/17/2021   Hypothyroidism 10/05/2019   Allergic rhinitis 09/17/2019   History of cholecystitis 07/16/2019   Skin lesion 07/16/2019   Elevated LFTs 11/19/2018   Anemia 07/13/2018   Fall 10/31/2017   Abdominal aortic atherosclerosis ([place]) 09/16/2017   Rash 09/04/2017   Elevated TSH 09/04/2017   Chronic back pain 01/14/2017   Scoliosis 10/14/2016   Frequent falls 04/18/2016   Paroxysmal atrial fibrillation (HCC)    Mitral regurgitation    Chronic diastolic CHF (congestive heart failure) ([place])    Hearing loss 01/09/2010   Hyperlipidemia 08/10/2007   GERD (gastroesophageal reflux disease) 02/22/2007    Past Surgical History:  Procedure Laterality Date   BILIARY STENT PLACEMENT  08/21/2018   Procedure: BILIARY STENT PLACEMENT;  Surgeon: [name], MD;  Location: [facility] ENDOSCOPY;  Service: Gastroenterology;;   COLONOSCOPY  2006   Epidural steroids     x1   ERCP N/A 08/21/2018   Procedure: ENDOSCOPIC RETROGRADE CHOLANGIOPANCREATOGRAPHY (ERCP);  Surgeon: [name], MD;  Location: [facility]  ENDOSCOPY;  Service: Gastroenterology;  Laterality: N/A;   ESOPHAGEAL DILATION  2006   ESOPHAGOGASTRODUODENOSCOPY (EGD) WITH PROPOFOL N/A 11/16/2018   Procedure: ESOPHAGOGASTRODUODENOSCOPY (EGD) WITH PROPOFOL;  Surgeon: [name], MD;  Location: [facility] ENDOSCOPY;  Service: Gastroenterology;  Laterality: N/A;   G3 P4 (twins)     IR CHOLANGIOGRAM EXISTING TUBE  08/18/2018   IR EXCHANGE BILIARY DRAIN  11/03/2018   IR EXCHANGE BILIARY DRAIN  12/01/2018   IR EXCHANGE BILIARY DRAIN  05/13/2019   IR EXCHANGE BILIARY DRAIN  06/14/2019   IR EXCHANGE BILIARY DRAIN  07/19/2019   IR EXCHANGE BILIARY  DRAIN  08/17/2019   IR EXCHANGE BILIARY DRAIN  09/30/2019   IR EXCHANGE BILIARY DRAIN  12/08/2019   IR EXCHANGE BILIARY DRAIN  01/19/2020   IR EXCHANGE BILIARY DRAIN  03/01/2020   IR EXCHANGE BILIARY DRAIN  04/20/2020   IR EXCHANGE BILIARY DRAIN  07/06/2020   IR EXCHANGE BILIARY DRAIN  10/26/2020   IR EXCHANGE BILIARY DRAIN  01/18/2021   IR EXCHANGE BILIARY DRAIN  03/05/2021   IR PERC CHOLECYSTOSTOMY  08/16/2018   REMOVAL OF STONES  08/21/2018   Procedure: REMOVAL OF STONES;  Surgeon: [name], MD;  Location: [place];  Service: Gastroenterology;;   SKIN CANCER EXCISION     SPHINCTEROTOMY  08/21/2018   Procedure: Joan Mayans;  Surgeon: [name], MD;  Location: [place];  Service: Gastroenterology;;    Prior to Admission medications   Medication Sig Start Date End Date Taking? Authorizing Provider  amiodarone (PACERONE) 200 MG tablet Take 1 tablet (200 mg total) by mouth daily. 04/21/20   Minna Merritts, MD  apixaban (ELIQUIS) 2.5 MG TABS tablet Take 1 tablet (2.5 mg total) by mouth 2 (two) times daily. 04/21/20   Minna Merritts, MD  benzonatate (TESSALON) 200 MG capsule Take 1 capsule (200 mg total) by mouth 2 (two) times daily as needed for cough. 05/21/21   [name], MD  cholecalciferol (VITAMIN D3) 25 MCG (1000 UNIT) tablet Take 1,000 Units by mouth daily.    [provider]  doxycycline (VIBRA-TABS) 100 MG tablet Take 1 tablet (100 mg total) by mouth 2 (two) times daily. 05/21/21   [name], MD  ferrous sulfate 325 (65 FE) MG EC tablet Take 325 mg by mouth daily.    [provider]  fluticasone Asencion Islam) 50 MCG/ACT nasal spray USE 2 SPRAYS IN EACH NOSTRIL DAILY 12/11/20   [name], MD  levothyroxine (SYNTHROID) 50 MCG tablet Take 1 tablet (50 mcg total) by mouth daily. 11/15/20   [name], MD  magnesium chloride (SLOW-MAG) 64 MG TBEC SR tablet Take 2 tablets (128 mg total) by mouth daily. 12/18/17   [name], MD   metoprolol tartrate (LOPRESSOR) 25 MG tablet Take 1 tablet (25 mg total) by mouth 2 (two) times daily. 04/21/20   Minna Merritts, MD  molnupiravir EUA (LAGEVRIO) 200 mg CAPS capsule Take 4 capsules (800 mg total) by mouth 2 (two) times daily for 5 days. 05/17/21 05/22/21  Flinchum, Kelby Aline, FNP  omeprazole (PRILOSEC) 40 MG capsule Take 1 capsule (40 mg total) by mouth daily. 04/21/20   Minna Merritts, MD  rosuvastatin (CRESTOR) 20 MG tablet Take 1 tablet (20 mg total) by mouth at bedtime. 04/21/20   Minna Merritts, MD  Sodium Chloride Flush (NORMAL SALINE FLUSH) 0.9 % SOLN FLUSH ONCE DAILY AS DIRECTED 01/29/21 01/29/22  Criselda Peaches, MD    Allergies Metronidazole and  Penicillin g  Family History  Problem Relation Age of Onset   Breast cancer Maternal Aunt    Hepatitis Daughter        C   Lung disease Mother        lung tumor   Heart attack Daughter 37       S/P stents   Liver cancer Daughter    Heart attack Sister 103   Diabetes Neg Hx    Stroke Neg Hx     Social History Social History   Tobacco Use   Smoking status: Former    Types: Cigarettes    Quit date: 06/04/1951    Years since quitting: 70.0   Smokeless tobacco: Never   Tobacco comments:    Smoked 1945-1953 , up to 2 cigarettes/day  Vaping Use   Vaping Use: Never used  Substance Use Topics   Alcohol use: Yes    Alcohol/week: 1.0 standard drink    Types: 1 Glasses of wine per week    Comment: occassional   Drug use: No    Review of Systems   Review of Systems  Respiratory:  Negative for shortness of breath.   Cardiovascular:  Negative for chest pain.  Gastrointestinal:  Negative for abdominal pain.  Musculoskeletal:  Positive for arthralgias. Negative for back pain.  Skin:  Positive for wound.  Psychiatric/Behavioral:  Negative for confusion.   All other systems reviewed and are negative.  Physical Exam Updated Vital Signs BP (!) 97/58 (BP Location: Right Arm)    Pulse 62    Temp 97.8 F  (36.6 C) (Oral)    Resp 18    Ht 5\' 4"  (1.626 m)    Wt 43.1 kg    SpO2 100%    BMI 16.31 kg/m   Physical Exam Vitals and nursing note reviewed.  Constitutional:      General: She is not in acute distress.    Appearance: Normal appearance.  HENT:     Head: Normocephalic and atraumatic.     Comments: Superficial laceration on the skin under the left lip approximately 1 cm, significant swelling of the lower lip There is a superficial laceration of the labial surface of the lower lip, not gaping Irregular laceration on the upper lip involving the vermilion border, mild oozing No midface tenderness, no trismus Eyes:     General: No scleral icterus.    Extraocular Movements: Extraocular movements intact.     Conjunctiva/sclera: Conjunctivae normal.     Pupils: Pupils are equal, round, and reactive to light.  Cardiovascular:     Rate and Rhythm: Normal rate and regular rhythm.  Pulmonary:     Effort: Pulmonary effort is normal. No respiratory distress.     Breath sounds: No stridor.  Abdominal:     General: Abdomen is flat. There is no distension.     Palpations: Abdomen is soft.     Tenderness: There is no abdominal tenderness. There is no guarding.     Comments: Percutaneous biliary drain in place  Musculoskeletal:        General: No deformity or signs of injury.     Cervical back: Normal range of motion and neck supple. No tenderness.     Comments: Left shoulder with significant swelling, left forearm with splint in place No chest wall tenderness Pelvis is stable nontender  Skin:    General: Skin is dry.     Coloration: Skin is not jaundiced or pale.  Neurological:     Mental Status: She  is alert.     Comments: Is extremely hard of hearing Moves all extremities spontaneously  Psychiatric:        Mood and Affect: Mood normal.        Behavior: Behavior normal.     LABS (all labs ordered are listed, but only abnormal results are displayed)  Labs Reviewed - No data to  display ____________________________________________  EKG   ____________________________________________  RADIOLOGY Almeta Monas, personally viewed and evaluated these images (plain radiographs) as part of my medical decision making, as well as reviewing the written report by the radiologist.  ED MD interpretation:      ____________________________________________   PROCEDURES  Procedure(s) performed (including Critical Care):  Marland KitchenMarland KitchenLaceration Repair  Date/Time: 05/22/2021 9:27 PM Performed by: Rada Hay, MD Authorized by: Rada Hay, MD   Consent:    Consent obtained:  Verbal Universal protocol:    Patient identity confirmed:  Verbally with patient Laceration details:    Location:  Lip   Lip location:  Upper exterior lip   Length (cm):  1 Exploration:    Wound extent: no foreign bodies/material noted and no vascular damage noted     Contaminated: no   Treatment:    Area cleansed with:  Saline   Amount of cleaning:  Standard   Irrigation volume:  30   Irrigation method:  Syringe   Visualized foreign bodies/material removed: no     Debridement:  None   Undermining:  None   Scar revision: no   Skin repair:    Repair method:  Sutures   Suture size:  5-0   Suture material:  Fast-absorbing gut   Suture technique:  Simple interrupted   Number of sutures:  3 Approximation:    Approximation:  Close   [place] border well-aligned: yes   Repair type:    Repair type:  Simple Post-procedure details:    Dressing:  Open (no dressing)   Procedure completion:  Tolerated   ____________________________________________   INITIAL IMPRESSION / ASSESSMENT AND PLAN / ED COURSE     Patient is a 87 year old female on anticoagulation who presents after an unwitnessed fall.  She was seen at [facility] initially diagnosed with a proximal humerus fracture.  She went to urgent care for evaluation of her lip laceration but was told to come to the  emergency department.  On exam she has an irregular macerated laceration of the upper lip involving the vermilion border that has some oozing as well as a superficial laceration to the chin with some swelling of the lower lip.  She has no focal tenderness of the chest wall abdomen pelvis.  Left shoulder is obviously swollen.  Tetanus was updated.  The upper lip laceration was repaired with absorbable sutures and was hemostatic.  We will obtain a CT head and C-spine given her age and the fact that she is anticoagulated.  Patient's daughter notably does not want to wait for imaging results.  I explained that if she does want to leave prior to results being read by radiology she would leaving [place].  Patient was signed out pending imaging.      ____________________________________________   FINAL CLINICAL IMPRESSION(S) / ED DIAGNOSES  Final diagnoses:  None     ED Discharge Orders     None        Note:  This document was prepared using Dragon voice recognition software and may include unintentional dictation errors.    Rada Hay, MD 05/22/21  2128 ° °

## 2021-05-22 NOTE — ED Provider Notes (Addendum)
----------------------------------------- 10:12 PM on 05/22/2021 -----------------------------------------  Blood pressure (!) 97/58, pulse 62, temperature 97.8 F (36.6 C), temperature source Oral, resp. rate 18, height 5\' 4"  (1.626 m), weight 43.1 kg, SpO2 100 %.  Assuming care from Dr. Starleen Blue.  In short, Kylie May is a 87 y.o. female with a chief complaint of Laceration .  Refer to the original H&P for additional details.  The current plan of care is to await CT results.  Patient presents to the emergency department after sustaining a mechanical fall earlier today.  Patient has been evaluated [facility] and has a proximal left humerus fracture as well as a finger fracture.  Patient had a lip laceration and was referred to the ED for further evaluation.  Lip laceration has been repaired prior to handoff.  Awaiting CT results at this time.  CT scan is reassuring with no acute intracranial signs of trauma.[name]  EXAM:  CT HEAD WITHOUT CONTRAST   CT CERVICAL SPINE WITHOUT CONTRAST   TECHNIQUE:  Multidetector CT imaging of the head and cervical spine was  performed following the standard protocol without intravenous  contrast. Multiplanar CT image reconstructions of the cervical spine  were also generated.   COMPARISON:  None.   FINDINGS:  CT HEAD FINDINGS   Brain: No acute intracranial hemorrhage. No focal mass lesion. No CT  evidence of acute infarction. No midline shift or mass effect. No  hydrocephalus. Basilar cisterns are patent.   There are periventricular and subcortical white matter  hypodensities. Generalized cortical atrophy.   Vascular: No hyperdense vessel or unexpected calcification.   Skull: Normal. Negative for fracture or focal lesion.   Sinuses/Orbits: Paranasal sinuses and mastoid air cells are clear.  Orbits are clear.   Other: None.   CT CERVICAL SPINE FINDINGS   Alignment: Normal alignment of the cervical vertebral bodies. Pannus  posterior  to the C2 the dens .   Skull base and vertebrae: Normal craniocervical junction. No loss of  vertebral body height or disc height. Normal facet articulation. No  evidence of fracture.   Soft tissues and spinal canal: No prevertebral soft tissue swelling.  No perispinal or epidural hematoma.   Disc levels: There is joint space narrowing endplate spurring and  sclerosis from C4-C7. No acute findings.   Upper chest: Clear   Other: None   IMPRESSION:  1. No intracranial trauma.  2. Age-appropriate atrophy and microvascular disease.  3. No cervical spine fracture.  4. Multilevel disc osteophytic disease.    Electronically Signed    By: Suzy Bouchard M.D.    On: 05/22/2021 22:04    Current plan was to await imaging, and if this is reassuring the patient may be discharged.  Results are reassuring and patient will be discharged at this time.    Addendum: At time of discharge, patient had her vitals taken and her blood pressure was 58/30.  Recheck with manual blood pressure revealed 60/48.  Patient is very drowsy at this time though this is significant later than her normal bedtime.  She does arouse to verbal stimuli and is able to answer questions at this time.  At this time I will order labs, imaging on the patient.  Patient does have a history of CHF but I will administer 500 mL of fluid as we were evaluating patient.  She does not appear to be fluid overloaded at this time.  Patient is transferred to the major side of the emergency department.  I have handed off care to  attending provider, Dr. Beather Arbour.  I had a lengthy discussion with the patient's family in regards to patient's wishes should she have extensive trauma causing her hypotension or ultimately have a worsening of her condition leading to need for emergent life-saving intervention.  According to the patient's daughter, the patient would like everything tried in lifesaving effort.  If there is significant trauma however, they  understand the risk of significant intervention and would prefer palliative care.   ED diagnosis:  Fall Lip laceration Proximal humerus fracture Finger fracture   Darletta Moll, PA-C 05/22/21 2217    Lace Chenevert, Charline Bills, PA-C 05/22/21 2359    Rada Hay, MD 05/23/21 2039

## 2021-05-22 NOTE — ED Provider Notes (Signed)
----------------------------------------- °  11:26 PM on 05/22/2021 -----------------------------------------   Assumed care of patient who became hypotensive at discharge.  Sepsis workup pending.  Updated patient and daughter of test results thus far.  Systolic blood pressure currently 147.  Daughter states patient still appears altered compared to usual.  Initiating IV fluid resuscitation after IV team places a line. Anticipate hospitalization.   ----------------------------------------- 2:04 AM on 05/23/2021 -----------------------------------------   Purewick applied after unsuccessful in and out attempt for urine.  Lactic acid elevated.  Sepsis protocol initiated.  Discussed with hospitalist services for admission.   Paulette Blanch, MD 05/23/21 [id number]

## 2021-05-22 NOTE — ED Notes (Addendum)
PA  notified noted pt's BP 60/40, 60/38 (manual) prior to discharge

## 2021-05-22 NOTE — ED Triage Notes (Signed)
Pts daughter reports that pt had a fall earlier today, family took her to [facility] they did scans on her, they got her lip to stop bleeding earlier, but it began to bleed again. Daughter reports they did not address her laceration on her lip. Left lower side of lip is swollen and her laceration is oozing blood.

## 2021-05-22 NOTE — ED Notes (Signed)
ED Provider at bedside. 

## 2021-05-22 NOTE — Discharge Instructions (Signed)
You can ice your lip.  The stitches will absorb on their own so you do not have to have them removed.  Please follow-up with orthopedist as scheduled.

## 2021-05-23 DIAGNOSIS — K219 Gastro-esophageal reflux disease without esophagitis: Secondary | ICD-10-CM | POA: Diagnosis present

## 2021-05-23 DIAGNOSIS — E871 Hypo-osmolality and hyponatremia: Secondary | ICD-10-CM | POA: Diagnosis not present

## 2021-05-23 DIAGNOSIS — Z434 Encounter for attention to other artificial openings of digestive tract: Secondary | ICD-10-CM | POA: Diagnosis not present

## 2021-05-23 DIAGNOSIS — E7800 Pure hypercholesterolemia, unspecified: Secondary | ICD-10-CM | POA: Diagnosis present

## 2021-05-23 DIAGNOSIS — I9589 Other hypotension: Secondary | ICD-10-CM | POA: Diagnosis not present

## 2021-05-23 DIAGNOSIS — G9341 Metabolic encephalopathy: Secondary | ICD-10-CM

## 2021-05-23 DIAGNOSIS — F17210 Nicotine dependence, cigarettes, uncomplicated: Secondary | ICD-10-CM | POA: Diagnosis present

## 2021-05-23 DIAGNOSIS — S42202A Unspecified fracture of upper end of left humerus, initial encounter for closed fracture: Secondary | ICD-10-CM | POA: Diagnosis present

## 2021-05-23 DIAGNOSIS — S01511A Laceration without foreign body of lip, initial encounter: Secondary | ICD-10-CM

## 2021-05-23 DIAGNOSIS — Z23 Encounter for immunization: Secondary | ICD-10-CM | POA: Diagnosis present

## 2021-05-23 DIAGNOSIS — W19XXXA Unspecified fall, initial encounter: Secondary | ICD-10-CM | POA: Diagnosis present

## 2021-05-23 DIAGNOSIS — H9190 Unspecified hearing loss, unspecified ear: Secondary | ICD-10-CM | POA: Diagnosis present

## 2021-05-23 DIAGNOSIS — I341 Nonrheumatic mitral (valve) prolapse: Secondary | ICD-10-CM | POA: Diagnosis present

## 2021-05-23 DIAGNOSIS — S62609A Fracture of unspecified phalanx of unspecified finger, initial encounter for closed fracture: Secondary | ICD-10-CM | POA: Diagnosis present

## 2021-05-23 DIAGNOSIS — Z7901 Long term (current) use of anticoagulants: Secondary | ICD-10-CM | POA: Diagnosis not present

## 2021-05-23 DIAGNOSIS — E878 Other disorders of electrolyte and fluid balance, not elsewhere classified: Secondary | ICD-10-CM | POA: Diagnosis present

## 2021-05-23 DIAGNOSIS — I700 Atherosclerosis of aorta: Secondary | ICD-10-CM | POA: Diagnosis present

## 2021-05-23 DIAGNOSIS — E039 Hypothyroidism, unspecified: Secondary | ICD-10-CM | POA: Diagnosis present

## 2021-05-23 DIAGNOSIS — E861 Hypovolemia: Secondary | ICD-10-CM | POA: Diagnosis not present

## 2021-05-23 DIAGNOSIS — Z20822 Contact with and (suspected) exposure to covid-19: Secondary | ICD-10-CM | POA: Diagnosis present

## 2021-05-23 DIAGNOSIS — I959 Hypotension, unspecified: Secondary | ICD-10-CM | POA: Diagnosis present

## 2021-05-23 DIAGNOSIS — I5032 Chronic diastolic (congestive) heart failure: Secondary | ICD-10-CM | POA: Diagnosis present

## 2021-05-23 DIAGNOSIS — S0990XA Unspecified injury of head, initial encounter: Secondary | ICD-10-CM | POA: Diagnosis present

## 2021-05-23 DIAGNOSIS — S01511S Laceration without foreign body of lip, sequela: Secondary | ICD-10-CM | POA: Diagnosis not present

## 2021-05-23 DIAGNOSIS — A419 Sepsis, unspecified organism: Secondary | ICD-10-CM

## 2021-05-23 DIAGNOSIS — E222 Syndrome of inappropriate secretion of antidiuretic hormone: Secondary | ICD-10-CM | POA: Diagnosis present

## 2021-05-23 DIAGNOSIS — D62 Acute posthemorrhagic anemia: Secondary | ICD-10-CM | POA: Diagnosis present

## 2021-05-23 DIAGNOSIS — Z8249 Family history of ischemic heart disease and other diseases of the circulatory system: Secondary | ICD-10-CM | POA: Diagnosis not present

## 2021-05-23 DIAGNOSIS — I482 Chronic atrial fibrillation, unspecified: Secondary | ICD-10-CM | POA: Diagnosis present

## 2021-05-23 DIAGNOSIS — M810 Age-related osteoporosis without current pathological fracture: Secondary | ICD-10-CM | POA: Diagnosis present

## 2021-05-23 DIAGNOSIS — R6510 Systemic inflammatory response syndrome (SIRS) of non-infectious origin without acute organ dysfunction: Secondary | ICD-10-CM | POA: Diagnosis present

## 2021-05-23 LAB — URINALYSIS, COMPLETE (UACMP) WITH MICROSCOPIC
Bacteria, UA: NONE SEEN
Bilirubin Urine: NEGATIVE
Glucose, UA: NEGATIVE mg/dL
Hgb urine dipstick: NEGATIVE
Leukocytes,Ua: NEGATIVE
Nitrite: NEGATIVE
Specific Gravity, Urine: 1.025 (ref 1.005–1.030)
pH: 5 (ref 5.0–8.0)

## 2021-05-23 LAB — CBC WITH DIFFERENTIAL/PLATELET
Abs Immature Granulocytes: 0.17 10*3/uL — ABNORMAL HIGH (ref 0.00–0.07)
Basophils Absolute: 0.1 10*3/uL (ref 0.0–0.1)
Basophils Relative: 0 %
Eosinophils Absolute: 0 10*3/uL (ref 0.0–0.5)
Eosinophils Relative: 0 %
HCT: 32.2 % — ABNORMAL LOW (ref 36.0–46.0)
Hemoglobin: 11.2 g/dL — ABNORMAL LOW (ref 12.0–15.0)
Immature Granulocytes: 1 %
Lymphocytes Relative: 5 %
Lymphs Abs: 1 10*3/uL (ref 0.7–4.0)
MCH: 32.3 pg (ref 26.0–34.0)
MCHC: 34.8 g/dL (ref 30.0–36.0)
MCV: 92.8 fL (ref 80.0–100.0)
Monocytes Absolute: 1.4 10*3/uL — ABNORMAL HIGH (ref 0.1–1.0)
Monocytes Relative: 7 %
Neutro Abs: 18.2 10*3/uL — ABNORMAL HIGH (ref 1.7–7.7)
Neutrophils Relative %: 87 %
Platelets: 255 10*3/uL (ref 150–400)
RBC: 3.47 MIL/uL — ABNORMAL LOW (ref 3.87–5.11)
RDW: 12.1 % (ref 11.5–15.5)
WBC: 20.9 10*3/uL — ABNORMAL HIGH (ref 4.0–10.5)
nRBC: 0 % (ref 0.0–0.2)

## 2021-05-23 LAB — COMPREHENSIVE METABOLIC PANEL
ALT: 103 U/L — ABNORMAL HIGH (ref 0–44)
AST: 90 U/L — ABNORMAL HIGH (ref 15–41)
Albumin: 3.4 g/dL — ABNORMAL LOW (ref 3.5–5.0)
Alkaline Phosphatase: 58 U/L (ref 38–126)
Anion gap: 7 (ref 5–15)
BUN: 18 mg/dL (ref 8–23)
CO2: 25 mmol/L (ref 22–32)
Calcium: 9.2 mg/dL (ref 8.9–10.3)
Chloride: 94 mmol/L — ABNORMAL LOW (ref 98–111)
Creatinine, Ser: 0.96 mg/dL (ref 0.44–1.00)
GFR, Estimated: 54 mL/min — ABNORMAL LOW (ref >60)
Glucose, Bld: 142 mg/dL — ABNORMAL HIGH (ref 70–99)
Potassium: 4.7 mmol/L (ref 3.5–5.1)
Sodium: 126 mmol/L — ABNORMAL LOW (ref 135–145)
Total Bilirubin: 1 mg/dL (ref 0.3–1.2)
Total Protein: 6.1 g/dL — ABNORMAL LOW (ref 6.5–8.1)

## 2021-05-23 LAB — BASIC METABOLIC PANEL
Anion gap: 4 — ABNORMAL LOW (ref 5–15)
BUN: 17 mg/dL (ref 8–23)
CO2: 23 mmol/L (ref 22–32)
Calcium: 8.2 mg/dL — ABNORMAL LOW (ref 8.9–10.3)
Chloride: 99 mmol/L (ref 98–111)
Creatinine, Ser: 0.89 mg/dL (ref 0.44–1.00)
GFR, Estimated: 59 mL/min — ABNORMAL LOW (ref >60)
Glucose, Bld: 129 mg/dL — ABNORMAL HIGH (ref 70–99)
Potassium: 4.6 mmol/L (ref 3.5–5.1)
Sodium: 126 mmol/L — ABNORMAL LOW (ref 135–145)

## 2021-05-23 LAB — CORTISOL-AM, BLOOD: Cortisol - AM: 22 ug/dL (ref 6.7–22.6)

## 2021-05-23 LAB — BRAIN NATRIURETIC PEPTIDE: B Natriuretic Peptide: 190.1 pg/mL — ABNORMAL HIGH (ref 0.0–100.0)

## 2021-05-23 LAB — CBC
HCT: 25.4 % — ABNORMAL LOW (ref 36.0–46.0)
Hemoglobin: 8.9 g/dL — ABNORMAL LOW (ref 12.0–15.0)
MCH: 33.1 pg (ref 26.0–34.0)
MCHC: 35 g/dL (ref 30.0–36.0)
MCV: 94.4 fL (ref 80.0–100.0)
Platelets: 184 10*3/uL (ref 150–400)
RBC: 2.69 MIL/uL — ABNORMAL LOW (ref 3.87–5.11)
RDW: 12.1 % (ref 11.5–15.5)
WBC: 16.7 10*3/uL — ABNORMAL HIGH (ref 4.0–10.5)
nRBC: 0 % (ref 0.0–0.2)

## 2021-05-23 LAB — PROCALCITONIN
Procalcitonin: 0.22 ng/mL
Procalcitonin: 0.18 ng/mL

## 2021-05-23 LAB — RESP PANEL BY RT-PCR (FLU A&B, COVID) ARPGX2
Influenza A by PCR: NEGATIVE
Influenza B by PCR: NEGATIVE
SARS Coronavirus 2 by RT PCR: NEGATIVE

## 2021-05-23 LAB — TROPONIN I (HIGH SENSITIVITY)
Troponin I (High Sensitivity): 9 ng/L (ref <18)
Troponin I (High Sensitivity): 10 ng/L (ref <18)

## 2021-05-23 LAB — LACTIC ACID, PLASMA
Lactic Acid, Venous: 2.3 mmol/L (ref 0.5–1.9)
Lactic Acid, Venous: 2.1 mmol/L (ref 0.5–1.9)

## 2021-05-23 LAB — PROTIME-INR
INR: 1.2 (ref 0.8–1.2)
Prothrombin Time: 15.2 seconds (ref 11.4–15.2)

## 2021-05-23 LAB — OSMOLALITY, URINE: Osmolality, Ur: 490 mOsm/kg (ref 300–900)

## 2021-05-23 LAB — SODIUM, URINE, RANDOM: Sodium, Ur: 13 mmol/L

## 2021-05-23 MED ORDER — SODIUM CHLORIDE 0.9 % IV SOLN: 2.0000 g | Freq: Once | INTRAVENOUS | Status: DC

## 2021-05-23 MED ORDER — SODIUM CHLORIDE 0.9 % IV SOLN
2.0000 g | INTRAVENOUS | Status: DC
  Administered 2021-05-24: 07:00:00 2 g via INTRAVENOUS
  Filled 2021-05-23: qty 2

## 2021-05-23 MED ORDER — TRAMADOL HCL 50 MG PO TABS
50.0000 mg | ORAL_TABLET | Freq: Two times a day (BID) | ORAL | Status: DC | PRN
  Administered 2021-05-23: 23:00:00 50 mg via ORAL
  Filled 2021-05-23: qty 1

## 2021-05-23 MED ORDER — SODIUM CHLORIDE 0.9 % IV BOLUS (SEPSIS)
500.0000 mL | Freq: Once | INTRAVENOUS | Status: AC
  Administered 2021-05-23: 04:00:00 500 mL via INTRAVENOUS

## 2021-05-23 MED ORDER — VANCOMYCIN HCL IN DEXTROSE 1-5 GM/200ML-% IV SOLN
1000.0000 mg | Freq: Once | INTRAVENOUS | Status: AC
  Administered 2021-05-23: 02:00:00 1000 mg via INTRAVENOUS
  Filled 2021-05-23: qty 200

## 2021-05-23 MED ORDER — FLUTICASONE PROPIONATE 50 MCG/ACT NA SUSP
2.0000 | Freq: Every day | NASAL | Status: DC
Start: 2021-05-23 — End: 2021-05-26
  Administered 2021-05-23 – 2021-05-26 (×4): 2 via NASAL
  Filled 2021-05-23: qty 16

## 2021-05-23 MED ORDER — ROSUVASTATIN CALCIUM 10 MG PO TABS
20.0000 mg | ORAL_TABLET | Freq: Every day | ORAL | Status: DC
  Administered 2021-05-23 – 2021-05-25 (×3): 20 mg via ORAL
  Filled 2021-05-23 (×3): qty 2
  Filled 2021-05-23: qty 1

## 2021-05-23 MED ORDER — MAGNESIUM CHLORIDE 64 MG PO TBEC
2.0000 | DELAYED_RELEASE_TABLET | Freq: Every day | ORAL | Status: DC
  Administered 2021-05-23 – 2021-05-26 (×4): 128 mg via ORAL
  Filled 2021-05-23 (×4): qty 2

## 2021-05-23 MED ORDER — ENOXAPARIN SODIUM 30 MG/0.3ML IJ SOSY
30.0000 mg | PREFILLED_SYRINGE | INTRAMUSCULAR | Status: DC
  Filled 2021-05-23: qty 0.3

## 2021-05-23 MED ORDER — LEVOTHYROXINE SODIUM 50 MCG PO TABS
50.0000 ug | ORAL_TABLET | Freq: Every day | ORAL | Status: DC
  Administered 2021-05-23 – 2021-05-26 (×4): 50 ug via ORAL
  Filled 2021-05-23 (×4): qty 1

## 2021-05-23 MED ORDER — IBUPROFEN 400 MG PO TABS
200.0000 mg | ORAL_TABLET | Freq: Four times a day (QID) | ORAL | Status: DC | PRN
  Filled 2021-05-23: qty 1

## 2021-05-23 MED ORDER — ONDANSETRON HCL 4 MG/2ML IJ SOLN: 4.0000 mg | Freq: Four times a day (QID) | INTRAMUSCULAR | Status: DC | PRN

## 2021-05-23 MED ORDER — SODIUM CHLORIDE 0.9 % IV BOLUS (SEPSIS): 1000.0000 mL | Freq: Once | INTRAVENOUS | Status: DC

## 2021-05-23 MED ORDER — VANCOMYCIN HCL IN DEXTROSE 1-5 GM/200ML-% IV SOLN: 1000.0000 mg | Freq: Once | INTRAVENOUS | Status: DC

## 2021-05-23 MED ORDER — METOPROLOL TARTRATE 25 MG PO TABS
25.0000 mg | ORAL_TABLET | Freq: Two times a day (BID) | ORAL | Status: DC
  Administered 2021-05-23 – 2021-05-26 (×6): 25 mg via ORAL
  Filled 2021-05-23 (×6): qty 1

## 2021-05-23 MED ORDER — MAGNESIUM HYDROXIDE 400 MG/5ML PO SUSP: 30.0000 mL | Freq: Every day | ORAL | Status: DC | PRN

## 2021-05-23 MED ORDER — ACETAMINOPHEN 650 MG RE SUPP
650.0000 mg | Freq: Four times a day (QID) | RECTAL | Status: DC | PRN
  Filled 2021-05-23: qty 1

## 2021-05-23 MED ORDER — SODIUM CHLORIDE 0.9 % IV SOLN: INTRAVENOUS | Status: DC

## 2021-05-23 MED ORDER — CEFEPIME HCL 2 G IJ SOLR
2.0000 g | Freq: Once | INTRAMUSCULAR | Status: AC
  Administered 2021-05-23: 05:00:00 2 g via INTRAVENOUS
  Filled 2021-05-23: qty 2

## 2021-05-23 MED ORDER — ENOXAPARIN SODIUM 40 MG/0.4ML IJ SOSY: 40.0000 mg | PREFILLED_SYRINGE | INTRAMUSCULAR | Status: DC

## 2021-05-23 MED ORDER — BENZONATATE 100 MG PO CAPS: 200.0000 mg | ORAL_CAPSULE | Freq: Two times a day (BID) | ORAL | Status: DC | PRN

## 2021-05-23 MED ORDER — TRAZODONE HCL 50 MG PO TABS
25.0000 mg | ORAL_TABLET | Freq: Every evening | ORAL | Status: DC | PRN
  Filled 2021-05-23: qty 1

## 2021-05-23 MED ORDER — ACETAMINOPHEN 325 MG PO TABS
650.0000 mg | ORAL_TABLET | Freq: Four times a day (QID) | ORAL | Status: DC | PRN
  Administered 2021-05-23 (×2): 650 mg via ORAL
  Filled 2021-05-23 (×2): qty 2

## 2021-05-23 MED ORDER — VANCOMYCIN HCL 750 MG/150ML IV SOLN: 750.0000 mg | INTRAVENOUS | Status: DC

## 2021-05-23 MED ORDER — FERROUS SULFATE 325 (65 FE) MG PO TABS
325.0000 mg | ORAL_TABLET | Freq: Every day | ORAL | Status: DC
  Administered 2021-05-23 – 2021-05-26 (×4): 325 mg via ORAL
  Filled 2021-05-23 (×4): qty 1

## 2021-05-23 MED ORDER — APIXABAN 2.5 MG PO TABS
2.5000 mg | ORAL_TABLET | Freq: Two times a day (BID) | ORAL | Status: DC
  Administered 2021-05-23 – 2021-05-26 (×7): 2.5 mg via ORAL
  Filled 2021-05-23 (×9): qty 1

## 2021-05-23 MED ORDER — AMIODARONE HCL 200 MG PO TABS
200.0000 mg | ORAL_TABLET | Freq: Every day | ORAL | Status: DC
  Administered 2021-05-23 – 2021-05-26 (×4): 200 mg via ORAL
  Filled 2021-05-23 (×4): qty 1

## 2021-05-23 MED ORDER — PANTOPRAZOLE SODIUM 40 MG PO TBEC
40.0000 mg | DELAYED_RELEASE_TABLET | Freq: Every day | ORAL | Status: DC
  Administered 2021-05-23 – 2021-05-26 (×4): 40 mg via ORAL
  Filled 2021-05-23 (×4): qty 1

## 2021-05-23 MED ORDER — ONDANSETRON HCL 4 MG PO TABS
4.0000 mg | ORAL_TABLET | Freq: Four times a day (QID) | ORAL | Status: DC | PRN
  Filled 2021-05-23: qty 1

## 2021-05-23 MED ORDER — VITAMIN D 25 MCG (1000 UNIT) PO TABS
1000.0000 [IU] | ORAL_TABLET | Freq: Every day | ORAL | Status: DC
  Administered 2021-05-23 – 2021-05-26 (×4): 1000 [IU] via ORAL
  Filled 2021-05-23 (×4): qty 1

## 2021-05-23 NOTE — Evaluation (Signed)
Physical Therapy Evaluation Patient Details Name: Kylie May MRN: [id number] DOB: 1921-11-18 Today's Date: 05/23/2021  History of Present Illness  CORTASIA SCREWS is a 87 y.o. Caucasian female with medical history significant for atrial fibrillation on Eliquis, GERD, diastolic CHF, diverticulosis, diverticulitis, lumbar compression fracture, dyslipidemia, mitral valve prolapse with regurgitation, history of pancreatitis and history of Esophageal stricture, who presented to the ER with acute onset fall with subsequent left  shoulder injury and lip laceration.   Clinical Impression  Pt received in Semi-Fowler's position and agreeable to therapy.  Pt's grandson, Hulen Skains, present in room throughout session.  Pt is HOH and according to grandson, practically deaf.  He communicates with her utilizing an iPhone for typing messages out for her to read.  Pt currently has residing with family members at home which are able to perform 24/7, round the clock assistance for patient.  Pt has bili tube placed on the R side as well.  Pt currently has L shoulder fx and has been advised to not place any weight through the UE.  Pt typically dresses/cleans/eat independently, but grandson usually cooks for pt.   Pt able to perform bed mobility with minA for UE management.  Pt then stood with CGA and use of SPC for safety and balance.  Pt did require minA for navigation of lines/leads throughout session.  Pt ambulated to other side of bed and then to the toilet where she was able to void.  Pt became somewhat impulsive and requesting to be placed back in bed due to her back pain.  Pt had difficulty understanding that a urine sample and self-care was necessary at this time.  OT was able to perform as pt ambulated to bed and was assisted into bed with modA for LE navigation and L UE.  Pt placed in bed with nursing contacted for mobility performance and concerns with billi tube.  Nursing present in room at conclusion of  session.  Grandson stated that they would not be placing pt in SNF and would take care of her at home.  Discharge plans to home with HHPT and 24/7 support are appropriate at this time.  Pt will continue to benefit from skilled therapy in order to address deficits listed below.       Recommendations for follow up therapy are one component of a multi-disciplinary discharge planning process, led by the attending physician.  Recommendations may be updated based on patient status, additional functional criteria and insurance authorization.  Follow Up Recommendations Home health PT    Assistance Recommended at Discharge Frequent or constant Supervision/Assistance  Functional Status Assessment Patient has had a recent decline in their functional status and demonstrates the ability to make significant improvements in function in a reasonable and predictable amount of time.  Engineer, technical sales    Recommendations for Other Services       Precautions / Restrictions Precautions Precautions: Fall Required Braces or Orthoses: Sling (L Shoulder) Restrictions Weight Bearing Restrictions: Yes LUE Weight Bearing: Non weight bearing      Mobility  Bed Mobility Overal bed mobility: Needs Assistance Bed Mobility: Supine to Sit;Sit to Supine     Supine to sit: Mod assist Sit to supine: Mod assist   General bed mobility comments: Pt required navigation of LE's and L shoulder into/out of bed for safety and pain management, modA +2.    Transfers Overall transfer level: Needs assistance Equipment used: Straight cane Transfers: Sit to/from Stand Sit to Stand: Min guard  General transfer comment: Pt able to perform STS from elevated ED bed prior to attempting ambulation.    Ambulation/Gait Ambulation/Gait assistance: Min guard;Min assist Gait Distance (Feet): 26 Feet Assistive device: Straight cane Gait Pattern/deviations: Step-to pattern;Decreased stride  length;Shuffle;Trunk flexed;Narrow base of support Gait velocity: decreased     General Gait Details: Pt able to ambulate in room with +2 assistance necessary for management of lines/leads, and CGA for safety in navigating to the toilet to void and back to bed.  Stairs            Wheelchair Mobility    Modified Rankin (Stroke Patients Only)       Balance Overall balance assessment: Needs assistance Sitting-balance support: Single extremity supported;Feet unsupported Sitting balance-Leahy Scale: Fair     Standing balance support: Single extremity supported;During functional activity;Reliant on assistive device for balance Standing balance-Leahy Scale: Fair Standing balance comment: Pt utilized SPC in R UE for support, and is able to maintain balance with ambulation, but is very impulsive with movements and is hard of hearing which makes it difficult to communicate with her to give her directions.                             Pertinent Vitals/Pain Pain Assessment: 0-10 Pain Score: 4  Pain Location: Mouth and L Shoulder Pain Descriptors / Indicators: Aching Pain Intervention(s): Limited activity within patient's tolerance;Monitored during session;Repositioned    Home Living Family/patient expects to be discharged to:: Private residence Living Arrangements: Other relatives Available Help at Discharge: Family;Available 24 hours/day;Available PRN/intermittently Type of Home: House Home Access: Stairs to enter Entrance Stairs-Rails: None Entrance Stairs-Number of Steps: 1 Threshold Alternate Level Stairs-Number of Steps: 14 Home Layout: Two level Home Equipment: Conservation officer, nature (2 wheels);Cane - single point Additional Comments: Pt does not use AD according to grandson.    Prior Function Prior Level of Function : Needs assist       Physical Assist : ADLs (physical)       ADLs Comments: Independent with dressing/self-care; Needs assistance with  cooking/cleaning     Hand Dominance   Dominant Hand: Right    Extremity/Trunk Assessment   Upper Extremity Assessment Upper Extremity Assessment: LUE deficits/detail LUE Deficits / Details: Pt's L UE in sling upon arrival and has NWBB orders placed for L UE. LUE: Unable to fully assess due to pain;Unable to fully assess due to immobilization    Lower Extremity Assessment Lower Extremity Assessment: Generalized weakness    Cervical / Trunk Assessment Cervical / Trunk Assessment: Lordotic  Communication   Communication: HOH  Cognition Arousal/Alertness: Awake/alert Behavior During Therapy: WFL for tasks assessed/performed Overall Cognitive Status: Within Functional Limits for tasks assessed                                          General Comments      Exercises Other Exercises Other Exercises: Pt and grandson, Hulen Skains, educated on importance of mobility and exercises in order to maintain strength of LE's and keeping functional mobility.   Assessment/Plan    PT Assessment Patient needs continued PT services  PT Problem List Decreased strength;Decreased activity tolerance;Decreased mobility;Decreased coordination;Decreased cognition;Decreased knowledge of use of DME;Decreased safety awareness;Decreased knowledge of precautions       PT Treatment Interventions DME instruction;Gait training;Functional mobility training;Therapeutic activities;Therapeutic exercise;Balance training;Neuromuscular re-education;Patient/family education    PT  Goals (Current goals can be found in the Care Plan section)  Acute Rehab PT Goals Patient Stated Goal: to go home. PT Goal Formulation: With patient Time For Goal Achievement: 06/06/21 Potential to Achieve Goals: Good    Frequency Min 2X/week   Barriers to discharge        Co-evaluation PT/OT/SLP Co-Evaluation/Treatment: Yes Reason for Co-Treatment: Complexity of the patient's impairments (multi-system  involvement);For patient/therapist safety;To address functional/ADL transfers PT goals addressed during session: Mobility/safety with mobility;Balance;Proper use of DME OT goals addressed during session: ADL's and self-care;Proper use of Adaptive equipment and DME       AM-PAC PT "6 Clicks" Mobility  Outcome Measure Help needed turning from your back to your side while in a flat bed without using bedrails?: A Little Help needed moving from lying on your back to sitting on the side of a flat bed without using bedrails?: A Little Help needed moving to and from a bed to a chair (including a wheelchair)?: A Little Help needed standing up from a chair using your arms (e.g., wheelchair or bedside chair)?: A Lot Help needed to walk in hospital room?: A Lot Help needed climbing 3-5 steps with a railing? : A Lot 6 Click Score: 15    End of Session Equipment Utilized During Treatment: Gait belt Activity Tolerance: Patient limited by pain Patient left: in bed;with family/visitor present Nurse Communication: Mobility status PT Visit Diagnosis: Unsteadiness on feet (R26.81);Other abnormalities of gait and mobility (R26.89);Repeated falls (R29.6);Muscle weakness (generalized) (M62.81);History of falling (Z91.81);Difficulty in walking, not elsewhere classified (R26.2);Pain Pain - part of body:  (Back, Mouth, and L Shoulder)    Time: 7096-2836 PT Time Calculation (min) (ACUTE ONLY): 49 min   Charges:   PT Evaluation $PT Eval High Complexity: 1 High PT Treatments $Gait Training: 8-22 mins $Self Care/Home Management: 8-22        Gwenlyn Saran, PT, DPT 05/23/21, 3:07 PM   Christie Nottingham 05/23/2021, 2:42 PM

## 2021-05-23 NOTE — Progress Notes (Signed)
PHARMACY -  BRIEF ANTIBIOTIC NOTE   Pharmacy has received consult(s) for vancomycin and aztreonam from an ED provider.  The patient's profile has been reviewed for ht/wt/allergies/indication/available labs.  Per chart review, pt has tolerated cephalosporins in the past. Will change aztreonam to cefepime.  One time order(s) placed for: Cefepime 2 g  Vancomycin 1 g  Further antibiotics/pharmacy consults should be ordered by admitting physician if indicated.                       Thank you, Forde Dandy Zera Markwardt 05/23/2021  1:26 AM

## 2021-05-23 NOTE — ED Notes (Signed)
Pt was able to use restroom while being ambulated by PT.

## 2021-05-23 NOTE — Progress Notes (Signed)
After midnight follow-up note   87 year old prior choledocholithiasis with septic shock status post cholecystotomy 08/21/2018, chronic A. fib RVR (since 2016) follows with Dr. Rockey Situ  Diagnosed with respiratory symptoms 12/12 eventually found to have COVID 12/15 which was subsequently felt to be a false positive as all the other testing was   [-]--was on Molnupiravir  Sustained fall 12/20 went to [facility] no fracture, on CT no intracranial bleeding found to have left lip swelling/bleeding--was to be discharged but found to be hypotensive and was felt to possibly have sepsis so was admitted Patient started on broad-spectrum antibiotics vancomycin/cefepime  Sodium on admission 126 lactic acid 2.1 procalcitonin 0.18 WBC 20 Hemoglobin 8.9 Platelet 184 Blood cultures 12/21 obtained CT head, CT neck only showing multilevel osteophytic disc disease C4-C7 no bleeding CT chest chronic compression fracture deformity sternum T10 L1 chronic fractures right superior and right inferior pubic rami and levoscoliosis lumbar spine  S: Patient appears well but very hard of hearing grandson acts as Optometrist She has some discomfort in her left arm She feels a pressure in her abdomen but is not sure if she should pass urine It appears she has not really been eating and drinking  O/e BP (!) 154/61    Pulse 62    Temp 97.8 F (36.6 C) (Oral)    Resp 20    Ht 5\' 4"  (1.626 m)    Wt 43.1 kg    SpO2 100%    BMI 16.31 kg/m  Alert coherent pleasant swelling of the lips Left arm in sling Abdomen soft she has a chronic cholecystotomy which appears to have clean skin around the area sealed There is fluid draining into that area No lower extremity edema ROM is otherwise intact except for extremity I examined the skin tear on her leg which does not appear grossly infected  P Puzzling situation I do not think she has a riproaring infection however with an elevated white count and lactic acid it is reasonable to  continue antibiotics at this time We will await urine analysis and culture which has been ordered For her hyponatremia I think she may have T toast potomania she is not eating and drinking a whole lot-we will get urine studies and reevaluate I discussed with Dr. Sharlet Salina of orthopedics and he states nonweightbearing to the upper extremity  No charge  Verneita Griffes, MD [facility] 1:15 PM

## 2021-05-23 NOTE — Progress Notes (Signed)
PHARMACIST - PHYSICIAN COMMUNICATION  CONCERNING:  Enoxaparin (Lovenox) for DVT Prophylaxis    RECOMMENDATION: Patient was prescribed enoxaprin 40mg  q24 hours for VTE prophylaxis.   Filed Weights   05/22/21 2052  Weight: 43.1 kg (95 lb)    Body mass index is 16.31 kg/m.  Estimated Creatinine Clearance: 22.8 mL/min (by C-G formula based on SCr of 0.96 mg/dL).   Patient is candidate for enoxaparin 30mg  every 24 hours based on CrCl <60ml/min or Weight <45kg  DESCRIPTION: Pharmacy has adjusted enoxaparin dose per [facility] policy.  Patient is now receiving enoxaparin 30 mg every 24 hours    Sherilyn Banker, PharmD Clinical Pharmacist  05/23/2021 5:03 AM

## 2021-05-23 NOTE — ED Notes (Signed)
Pt reoriented to the fact she has a Purwick in place.  Daughter reports staff were unsuccessful w/ an In and Out last night.

## 2021-05-23 NOTE — ED Notes (Signed)
Nonadherent dressing placed over drain.  Pt requesting something for pain.

## 2021-05-23 NOTE — ED Notes (Addendum)
Attempted I&O cath by RN x 2, unsuccessful, MD aware. Pt incontinent of stool and urine, pt cleaned, linens replaced. Pt tolerated well.

## 2021-05-23 NOTE — ED Notes (Signed)
Tara RN aware of assigned bed °

## 2021-05-23 NOTE — Progress Notes (Signed)
CODE SEPSIS - PHARMACY COMMUNICATION  **Broad Spectrum Antibiotics should be administered within 1 hour of Sepsis diagnosis**  Time Code Sepsis Called/Page Received: 0122  Antibiotics Ordered: cefepime and vancomycin  Time of 1st antibiotic administration: 0216   Sherilyn Banker ,PharmD Clinical Pharmacist  05/23/2021  1:24 AM

## 2021-05-23 NOTE — ED Notes (Signed)
Purwick placement and brief checked.  Yolanda Bonine will continue to encourage fluids.

## 2021-05-23 NOTE — Sepsis Progress Note (Signed)
Following per sepsis protocol   

## 2021-05-23 NOTE — Progress Notes (Signed)
Pharmacy Antibiotic Note  Kylie May is a 87 y.o. female admitted on 05/22/2021 with sepsis.  Pharmacy has been consulted for vancomycin and cefepime dosing for 7 days.  Plan: Cefepime 2 g IV q24h Pt received vancomycin 1 g IV x 1 loading dose. Will continue with 750 mg IV q48 hrs Est AUC: 518 Used: Scr 0.96, TBW, Vd 0.72 Monitor renal function and adjust dose as clinically indicated  Height: 5\' 4"  (162.6 cm) Weight: 43.1 kg (95 lb) IBW/kg (Calculated) : 54.7  Temp (24hrs), Avg:97.8 F (36.6 C), Min:97.8 F (36.6 C), Max:97.8 F (36.6 C)  Recent Labs  Lab 05/17/21 1104 05/22/21 2348 05/23/21 0015 05/23/21 0136 05/23/21 0410  WBC 8.3  --  20.9*  --   --   CREATININE 0.98  --   --  0.96  --   LATICACIDVEN  --  2.3*  --   --  2.1*    Estimated Creatinine Clearance: 22.8 mL/min (by C-G formula based on SCr of 0.96 mg/dL).    Allergies  Allergen Reactions   Metronidazole     Other reaction(s): Dizziness and giddiness (finding), Other (qualifier value) made things feel like they were vibrating like the bed and floor,also caused mild dizziness weakness   Penicillin G Rash    Antimicrobials this admission: 12/21 cefepime >>  12/21 vancomycin >>    Microbiology results: 12/21 BCx: sent  Thank you for allowing pharmacy to be a part of this patients care.  Forde Dandy Camerin Jimenez 05/23/2021 4:58 AM

## 2021-05-23 NOTE — ED Notes (Signed)
Non-adherent bandage applied to right lower leg skin tear. Pt's chin/lips cleaned, sutures in place in left upper lift, swelling and discoloration noted to both lips, chin and left cheek.

## 2021-05-23 NOTE — ED Notes (Signed)
Ice removed from left shoulder. Saline soaked gauze applied to right leg wound and chin to clean.

## 2021-05-23 NOTE — H&P (Signed)
[place]   PATIENT NAME: Kylie May    MR#:  [id number]  DATE OF BIRTH:   03, 1923  DATE OF ADMISSION:  05/22/2021  PRIMARY CARE PHYSICIAN: [name], MD   Patient is coming from: Home  REQUESTING/REFERRING PHYSICIAN: Lurline Hare, MD  CHIEF COMPLAINT:   Chief Complaint  Patient presents with   Laceration    HISTORY OF PRESENT ILLNESS:  Kylie May is a 87 y.o. Caucasian female with medical history significant for atrial fibrillation on Eliquis, GERD, diastolic CHF, diverticulosis, diverticulitis, lumbar compression fracture, dyslipidemia, mitral valve prolapse with regurgitation, history of pancreatitis and history of Esophageal stricture, who presented to the ER with acute onset fall with subsequent left  shoulder injury and lip laceration.  She was seen by [facility] and had no fracture or dislocation.  She was then seen in urgent care for her lip laceration for which she was referred to the emergency room for repair and further assessment.  Since her fall she has been acting normally.  She denies any nausea or vomiting or abdominal pain.  No chest pain or palpitations.  She had a sling to the left shoulder and long splint of the left arm as she reportedly had humerus fracture and finger fracture.  No report available from [facility] yet.  No dysuria, oliguria or hematuria or flank pain.  She has been having cough that is worsened over the last week and a half with no wheezing or dyspnea.  She had a positive COVID test on Thursday however to repeat PCR came back negative including 1 here in the ER.  ED Course: During the evening the patient was noted to be hypotensive with a BP of 97/58 EKG as reviewed by me : EKG showed normal sinus rhythm with rate of 58 with poor R wave progression.  Labs revealed hyponatremia 126 and hypochloremia of 94 with blood glucose of 142 and albumin 3.4 AST was slightly elevated at 90 and ALT 103 with troponin of 6.1.   Urinalysis is still pending.  Lactic acid was 2.1 and procalcitonin 0.22.  High-sensitivity troponin I was 9 and later 10.  CBC showed leukocytosis 20.9 with neutrophilia and mild anemia Imaging: Portable chest ray showed no acute cardiopulmonary disease.  Noncontrasted head CT scan revealed no acute intracranial abnormality and age-appropriate atrophy and microvascular disease.  C-spine CT showed no cervical spine fracture and multilevel disc osteophytic disease.  The patient was given IV vancomycin and cefepime as well as 1 L bolus of saline and Td toxoid.  She will be admitted to a medical telemetry bed for further evaluation and management PAST MEDICAL HISTORY:   Past Medical History:  Diagnosis Date   A-fib ([place])    Acid reflux disease    Arrhythmia    Atrial fibrillation with RVR ([place]) 08/15/2018   Chronic diastolic CHF (congestive heart failure) (HCC)    Compression fracture of lumbar vertebra ([place])    Diverticulitis 01-2008   GI [place]   Diverticulosis    Esophageal stricture 2006   Femoral bruit    Right   Fibrocystic breast disease    Hiatal hernia    History of esophageal stricture 06/14/2004   2006 esophageal dilation Schatzki's ring    History of skin cancer 12/29/2008   Basal cell cancer right glabella 07/10/2010 Dr. Evorn Gong, [facility] [place] , [place]    Hypercholesterolemia    [place] study LDL goal = < 160   Mitral regurgitation    a.  06/2014 EF 55-60%, elevated end-diastolic pressures, dilated LA at 4.3 cm, mildly dilated RA, severe mitral regurgitation, mild aortic sclerosis without stenosis, mod-severe TR   Mitral valve prolapse    Osteoporosis    Dr Matthew Saras   Pancreatitis 07-2008   Hospitalized    Patella fracture 05/13/2018   Schatzki's ring    Sepsis ([place]) 08/15/2018   Skin cancer    facial x 2. [name]    PAST SURGICAL HISTORY:   Past Surgical History:  Procedure Laterality Date   BILIARY STENT PLACEMENT  08/21/2018   Procedure: BILIARY STENT  PLACEMENT;  Surgeon: [name], MD;  Location: [facility] ENDOSCOPY;  Service: Gastroenterology;;   COLONOSCOPY  2006   Epidural steroids     x1   ERCP N/A 08/21/2018   Procedure: ENDOSCOPIC RETROGRADE CHOLANGIOPANCREATOGRAPHY (ERCP);  Surgeon: [name], MD;  Location: [place];  Service: Gastroenterology;  Laterality: N/A;   ESOPHAGEAL DILATION  2006   ESOPHAGOGASTRODUODENOSCOPY (EGD) WITH PROPOFOL N/A 11/16/2018   Procedure: ESOPHAGOGASTRODUODENOSCOPY (EGD) WITH PROPOFOL;  Surgeon: [name], MD;  Location: [facility] ENDOSCOPY;  Service: Gastroenterology;  Laterality: N/A;   G3 P4 (twins)     IR CHOLANGIOGRAM EXISTING TUBE  08/18/2018   IR EXCHANGE BILIARY DRAIN  11/03/2018   IR EXCHANGE BILIARY DRAIN  12/01/2018   IR EXCHANGE BILIARY DRAIN  05/13/2019   IR EXCHANGE BILIARY DRAIN  06/14/2019   IR EXCHANGE BILIARY DRAIN  07/19/2019   IR EXCHANGE BILIARY DRAIN  08/17/2019   IR EXCHANGE BILIARY DRAIN  09/30/2019   IR EXCHANGE BILIARY DRAIN  12/08/2019   IR EXCHANGE BILIARY DRAIN  01/19/2020   IR EXCHANGE BILIARY DRAIN  03/01/2020   IR EXCHANGE BILIARY DRAIN  04/20/2020   IR EXCHANGE BILIARY DRAIN  07/06/2020   IR EXCHANGE BILIARY DRAIN  10/26/2020   IR EXCHANGE BILIARY DRAIN  01/18/2021   IR EXCHANGE BILIARY DRAIN  03/05/2021   IR PERC CHOLECYSTOSTOMY  08/16/2018   REMOVAL OF STONES  08/21/2018   Procedure: REMOVAL OF STONES;  Surgeon: [name], MD;  Location: [place];  Service: Gastroenterology;;   SKIN CANCER EXCISION     SPHINCTEROTOMY  08/21/2018   Procedure: Joan [facility]ans;  Surgeon: [name], MD;  Location: [place];  Service: Gastroenterology;;    SOCIAL HISTORY:   Social History   Tobacco Use   Smoking status: Former    Types: Cigarettes    Quit date: 06/04/1951    Years since quitting: 70.0   Smokeless tobacco: Never   Tobacco comments:    Smoked 1945-1953 , up to 2 cigarettes/day  Substance Use Topics   Alcohol use: Yes    Alcohol/week: 1.0 standard drink     Types: 1 Glasses of wine per week    Comment: occassional    FAMILY HISTORY:   Family History  Problem Relation Age of Onset   Breast cancer Maternal Aunt    Hepatitis Daughter        C   Lung disease Mother        lung tumor   Heart attack Daughter 33       S/P stents   Liver cancer Daughter    Heart attack Sister 43   Diabetes Neg Hx    Stroke Neg Hx     DRUG ALLERGIES:   Allergies  Allergen Reactions   Metronidazole     Other reaction(s): Dizziness and giddiness (finding), Other (qualifier value) made things feel like they were vibrating like the bed and floor,also caused  mild dizziness weakness   Penicillin G Rash    REVIEW OF SYSTEMS:   ROS As per history of present illness. All pertinent systems were reviewed above. Constitutional, HEENT, cardiovascular, respiratory, GI, GU, musculoskeletal, neuro, psychiatric, endocrine, integumentary and hematologic systems were reviewed and are otherwise negative/unremarkable except for positive findings mentioned above in the HPI.   MEDICATIONS AT HOME:   Prior to Admission medications   Medication Sig Start Date End Date Taking? Authorizing Provider  amiodarone (PACERONE) 200 MG tablet Take 1 tablet (200 mg total) by mouth daily. 04/21/20  Yes Gollan, Kathlene November, MD  apixaban (ELIQUIS) 2.5 MG TABS tablet Take 1 tablet (2.5 mg total) by mouth 2 (two) times daily. 04/21/20  Yes Gollan, Kathlene November, MD  benzonatate (TESSALON) 200 MG capsule Take 1 capsule (200 mg total) by mouth 2 (two) times daily as needed for cough. 05/21/21  Yes [name], MD  cholecalciferol (VITAMIN D3) 25 MCG (1000 UNIT) tablet Take 1,000 Units by mouth daily.   Yes [provider]  doxycycline (VIBRA-TABS) 100 MG tablet Take 1 tablet (100 mg total) by mouth 2 (two) times daily. 05/21/21  Yes [name], MD  ferrous sulfate 325 (65 FE) MG EC tablet Take 325 mg by mouth daily.   Yes [provider]  fluticasone (FLONASE) 50  MCG/ACT nasal spray USE 2 SPRAYS IN EACH NOSTRIL DAILY 12/11/20  Yes [name], MD  levothyroxine (SYNTHROID) 50 MCG tablet Take 1 tablet (50 mcg total) by mouth daily. 11/15/20  Yes [name], MD  magnesium chloride (SLOW-MAG) 64 MG TBEC SR tablet Take 2 tablets (128 mg total) by mouth daily. 12/18/17  Yes [name], MD  metoprolol tartrate (LOPRESSOR) 25 MG tablet Take 1 tablet (25 mg total) by mouth 2 (two) times daily. 04/21/20  Yes Minna Merritts, MD  omeprazole (PRILOSEC) 40 MG capsule Take 1 capsule (40 mg total) by mouth daily. 04/21/20  Yes Minna Merritts, MD  rosuvastatin (CRESTOR) 20 MG tablet Take 1 tablet (20 mg total) by mouth at bedtime. 04/21/20  Yes Gollan, Kathlene November, MD  Sodium Chloride Flush (NORMAL SALINE FLUSH) 0.9 % SOLN FLUSH ONCE DAILY AS DIRECTED 01/29/21 01/29/22 Yes Criselda Peaches, MD      VITAL SIGNS:  Blood pressure (!) 145/55, pulse (!) 56, temperature 97.8 F (36.6 C), temperature source Oral, resp. rate 15, height 5\' 4"  (1.626 m), weight 43.1 kg, SpO2 100 %.  PHYSICAL EXAMINATION:  Physical Exam  GENERAL:  87 y.o.-year-old Caucasian female patient lying in the bed with no acute distress.  EYES: Pupils equal, round, reactive to light and accommodation. No scleral icterus. Extraocular muscles intact.  HEENT: Head atraumatic, normocephalic. Oropharynx and nasopharynx clear.  NECK:  Supple, no jugular venous distention. No thyroid enlargement, no tenderness.  LUNGS: Normal breath sounds bilaterally, no wheezing, rales,rhonchi or crepitation. No use of accessory muscles of respiration.  CARDIOVASCULAR: Regular rate and rhythm, S1, S2 normal. No murmurs, rubs, or gallops.  ABDOMEN: Soft, nondistended, nontender. Bowel sounds present. No organomegaly or mass.  EXTREMITIES: No pedal edema, cyanosis, or clubbing.  NEUROLOGIC: Cranial nerves II through XII are intact. Muscle strength 5/5 in all extremities. Sensation intact. Gait not  checked.  PSYCHIATRIC: The patient is alert and oriented x 3.  Normal affect and good eye contact. SKIN: No obvious rash, lesion, or ulcer.  Musculoskeletal: Left upper extremity in sling and long splint. LABORATORY PANEL:   CBC Recent Labs  Lab 05/23/21  0015  WBC 20.9*  HGB 11.2*  HCT 32.2*  PLT 255   ------------------------------------------------------------------------------------------------------------------  Chemistries  Recent Labs  Lab 05/23/21 0136  NA 126*  K 4.7  CL 94*  CO2 25  GLUCOSE 142*  BUN 18  CREATININE 0.96  CALCIUM 9.2  AST 90*  ALT 103*  ALKPHOS 58  BILITOT 1.0   ------------------------------------------------------------------------------------------------------------------  Cardiac Enzymes No results for input(s): TROPONINI in the last 168 hours. ------------------------------------------------------------------------------------------------------------------  RADIOLOGY:  CT HEAD WO CONTRAST (5MM)  Result Date: 05/22/2021 CLINICAL DATA:  Fall, blunt trauma. EXAM: CT HEAD WITHOUT CONTRAST CT CERVICAL SPINE WITHOUT CONTRAST TECHNIQUE: Multidetector CT imaging of the head and cervical spine was performed following the standard protocol without intravenous contrast. Multiplanar CT image reconstructions of the cervical spine were also generated. COMPARISON:  None. FINDINGS: CT HEAD FINDINGS Brain: No acute intracranial hemorrhage. No focal mass lesion. No CT evidence of acute infarction. No midline shift or mass effect. No hydrocephalus. Basilar cisterns are patent. There are periventricular and subcortical white matter hypodensities. Generalized cortical atrophy. Vascular: No hyperdense vessel or unexpected calcification. Skull: Normal. Negative for fracture or focal lesion. Sinuses/Orbits: Paranasal sinuses and mastoid air cells are clear. Orbits are clear. Other: None. CT CERVICAL SPINE FINDINGS Alignment: Normal alignment of the cervical vertebral  bodies. Pannus posterior to the C2 the dens . Skull base and vertebrae: Normal craniocervical junction. No loss of vertebral body height or disc height. Normal facet articulation. No evidence of fracture. Soft tissues and spinal canal: No prevertebral soft tissue swelling. No perispinal or epidural hematoma. Disc levels: There is joint space narrowing endplate spurring and sclerosis from C4-C7. No acute findings. Upper chest: Clear Other: None IMPRESSION: 1. No intracranial trauma. 2. Age-appropriate atrophy and microvascular disease. 3. No cervical spine fracture. 4. Multilevel disc osteophytic disease. Electronically Signed   By: Suzy Bouchard M.D.   On: 05/22/2021 22:04   CT Cervical Spine Wo Contrast  Result Date: 05/22/2021 CLINICAL DATA:  Fall, blunt trauma. EXAM: CT HEAD WITHOUT CONTRAST CT CERVICAL SPINE WITHOUT CONTRAST TECHNIQUE: Multidetector CT imaging of the head and cervical spine was performed following the standard protocol without intravenous contrast. Multiplanar CT image reconstructions of the cervical spine were also generated. COMPARISON:  None. FINDINGS: CT HEAD FINDINGS Brain: No acute intracranial hemorrhage. No focal mass lesion. No CT evidence of acute infarction. No midline shift or mass effect. No hydrocephalus. Basilar cisterns are patent. There are periventricular and subcortical white matter hypodensities. Generalized cortical atrophy. Vascular: No hyperdense vessel or unexpected calcification. Skull: Normal. Negative for fracture or focal lesion. Sinuses/Orbits: Paranasal sinuses and mastoid air cells are clear. Orbits are clear. Other: None. CT CERVICAL SPINE FINDINGS Alignment: Normal alignment of the cervical vertebral bodies. Pannus posterior to the C2 the dens . Skull base and vertebrae: Normal craniocervical junction. No loss of vertebral body height or disc height. Normal facet articulation. No evidence of fracture. Soft tissues and spinal canal: No prevertebral soft  tissue swelling. No perispinal or epidural hematoma. Disc levels: There is joint space narrowing endplate spurring and sclerosis from C4-C7. No acute findings. Upper chest: Clear Other: None IMPRESSION: 1. No intracranial trauma. 2. Age-appropriate atrophy and microvascular disease. 3. No cervical spine fracture. 4. Multilevel disc osteophytic disease. Electronically Signed   By: Suzy Bouchard M.D.   On: 05/22/2021 22:04   DG Chest Portable 1 View  Result Date: 05/22/2021 CLINICAL DATA:  fall, hypotensive EXAM: PORTABLE CHEST 1 VIEW COMPARISON:  Chest x-ray 05/17/2021, CT chest 07/05/2018  FINDINGS: The heart and mediastinal contours are unchanged. Aortic calcification. Left base streaky airspace opacity likely represent atelectasis. No focal consolidation. No pulmonary edema. No pleural effusion. No pneumothorax. No acute osseous abnormality.  Scoliosis. Right upper quadrant drain is noted. IMPRESSION: No active disease. Electronically Signed   By: Iven Finn M.D.   On: 05/22/2021 23:19   CT CHEST ABDOMEN PELVIS WO CONTRAST  Result Date: 05/23/2021 CLINICAL DATA:  Status post trauma. EXAM: CT CHEST, ABDOMEN AND PELVIS WITHOUT CONTRAST TECHNIQUE: Multidetector CT imaging of the chest, abdomen and pelvis was performed following the standard protocol without IV contrast. COMPARISON:  August 14, 2018 FINDINGS: CT CHEST FINDINGS Cardiovascular: There is moderate to marked severity calcification of the thoracic aorta. Normal heart size. No pericardial effusion. Mediastinum/Nodes: No enlarged mediastinal, hilar, or axillary lymph nodes. Thyroid gland, trachea, and esophagus demonstrate no significant findings. Lungs/Pleura: Mild linear scarring and/or atelectasis is seen within the posterior aspect of the right upper lobe and bilateral lung bases. There is no evidence of a pleural effusion or pneumothorax. Musculoskeletal: A chronic fracture deformity is seen involving the body of the sternum. Additional  chronic compression fracture deformity of the T10 vertebral body is seen. CT ABDOMEN PELVIS FINDINGS Hepatobiliary: No focal liver abnormality is seen. A percutaneous biliary drainage catheter is noted. Status post cholecystectomy. No biliary dilatation. Pancreas: Unremarkable. No pancreatic ductal dilatation or surrounding inflammatory changes. Spleen: Normal in size without focal abnormality. Adrenals/Urinary Tract: Adrenal glands are unremarkable. Kidneys are normal, without renal calculi, focal lesion, or hydronephrosis. Bladder is unremarkable. Stomach/Bowel: There is a small to moderate sized hiatal hernia. Appendix appears normal. No evidence of bowel wall thickening, distention, or inflammatory changes. Noninflamed diverticula are seen throughout the sigmoid colon. Vascular/Lymphatic: Aortic atherosclerosis. No enlarged abdominal or pelvic lymph nodes. Reproductive: Uterus and bilateral adnexa are unremarkable. Other: No abdominal wall hernia or abnormality. No abdominopelvic ascites. Musculoskeletal: There is a chronic compression fracture deformity of the L1 vertebral body. Additional chronic fractures of the right superior and right inferior pubic rami are seen. There is marked severity levoscoliosis of the lumbar spine. IMPRESSION: 1. No evidence of acute traumatic injury within the chest, abdomen or pelvis. 2. Chronic compression fracture deformities of the sternum, T10 and L1 vertebral bodies. 3. Sigmoid diverticulosis. 4. Small to moderate sized hiatal hernia. 5. Chronic fractures of the right superior and right inferior pubic rami. 6. Marked severity levoscoliosis of the lumbar spine. 7. Aortic atherosclerosis. Aortic Atherosclerosis (ICD10-I70.0). Electronically Signed   By: Virgina Norfolk M.D.   On: 05/23/2021 00:03      IMPRESSION AND PLAN:  Principal Problem:   Hypotension  1.  Altered mental status/metabolic encephalopathy with associated leukocytosis and elevated lactic acid level  and pending UA, concerning for sepsis of questionable etiology.  The patient had a recent fall with subsequent lip laceration status post repair. - The patient will be admitted to a medical telemetry bed. - We will continue antibiotic therapy with IV vancomycin and cefepime. - She will be hydrated with IV normal saline. - We will follow blood and urine culture.  2.  Paroxysmal atrial fibrillation. - We will continue Eliquis and amiodarone.  3.  Hypothyroidism. - We will continue Synthroid.  4.  Dyslipidemia. - We will continue statin therapy.  5.  GERD-continue PPI therapy.  DVT prophylaxis: Eliquis. Code Status: full code. Family Communication:  The plan of care was discussed in details with the patient (and family). I answered all questions. The patient agreed to proceed  with the above mentioned plan. Further management will depend upon hospital course. Disposition Plan: Back to previous home environment Consults called: none. All the records are reviewed and case discussed with ED provider.  Status is: Inpatient  Remains inpatient appropriate because:Ongoing diagnostic testing needed not appropriate for outpatient work up, Unsafe d/c plan, IV treatments appropriate due to intensity of illness or inability to take PO, and Inpatient level of care appropriate due to severity of illness   Dispo: The patient is from: Home              Anticipated d/c is to: Home              Patient currently is not medically stable to d/c.              Difficult to place patient: No     Christel Mormon M.D on 05/23/2021 at 4:46 AM  [facility]   From 7 PM-7 AM, contact night-coverage [email or web address]  CC: Primary care physician; [name], MD

## 2021-05-23 NOTE — ED Notes (Signed)
IV team at bedside 

## 2021-05-23 NOTE — Evaluation (Signed)
Occupational Therapy Evaluation Patient Details Name: Kylie May MRN: [id number] DOB: 01-11-1922 Today's Date: 05/23/2021   History of Present Illness Kylie May is a 87 y.o. Caucasian female with medical history significant for atrial fibrillation on Eliquis, GERD, diastolic CHF, diverticulosis, diverticulitis, lumbar compression fracture, dyslipidemia, mitral valve prolapse with regurgitation, history of pancreatitis and history of Esophageal stricture, who presented to the ER with acute onset fall with subsequent left  shoulder injury and lip laceration.   Clinical Impression   Ms Kylie May was seen for OT evaluation this date. Pt's grandson, Hulen Skains, present in room throughout session.  Pt is HOH and according to grandson, practically deaf.  He communicates with her utilizing an iPhone for typing messages out for her to read.  Pt currently has residing with family members at home which are able to perform 24/7, round the clock assistance for patient.  Pt has bili tube placed on the R side as well.  Pt currently has L shoulder fx and has been advised to not place any weight through the UE.  Pt typically dresses/cleans/eat independently, but grandson usually cooks for pt.   Upon arrival PT at bedside. Pt currently requires MIN A x2 + SPC for toilet t/f, +2 assist for lines mgmt and 2/2 HOH. MAX A perihygiene. Pt has poor insight into deficits. Pt would benefit from skilled OT to address noted impairments and functional limitations (see below for any additional details) in order to maximize safety and independence while minimizing falls risk and caregiver burden. Upon hospital discharge, recommend [place] with 24/7 SUPERVISION.        Recommendations for follow up therapy are one component of a multi-disciplinary discharge planning process, led by the attending physician.  Recommendations may be updated based on patient status, additional functional criteria and insurance authorization.    Follow Up Recommendations  Home health OT    Assistance Recommended at Discharge Frequent or constant Supervision/Assistance  Functional Status Assessment  Patient has had a recent decline in their functional status and demonstrates the ability to make significant improvements in function in a reasonable and predictable amount of time.  Equipment Recommendations  Hospital bed    Recommendations for Other Services       Precautions / Restrictions Precautions Precautions: Fall Required Braces or Orthoses: Sling Restrictions Weight Bearing Restrictions: Yes LUE Weight Bearing: Non weight bearing Other Position/Activity Restrictions: no ortho consult, assumed NWB      Mobility Bed Mobility Overal bed mobility: Needs Assistance Bed Mobility: Supine to Sit;Sit to Supine     Supine to sit: Mod assist Sit to supine: Mod assist   General bed mobility comments: asssit to maintain LUE NWBing    Transfers Overall transfer level: Needs assistance Equipment used: Straight cane Transfers: Sit to/from Stand Sit to Stand: Min assist           General transfer comment: MIN A from toilet height      Balance Overall balance assessment: Needs assistance Sitting-balance support: Single extremity supported;Feet unsupported Sitting balance-Leahy Scale: Fair     Standing balance support: Single extremity supported;During functional activity;Reliant on assistive device for balance Standing balance-Leahy Scale: Fair Standing balance comment: impulsive                           ADL either performed or assessed with clinical judgement   ADL Overall ADL's : Needs assistance/impaired  General ADL Comments: MIN A x2 + SPC for toilet t/f, +2 assist for lines mgmt and 2/2 HOH. MAX A perihygiene.      Pertinent Vitals/Pain Pain Assessment: 0-10 Pain Score: 6  Pain Location: Mouth and L Shoulder Pain Descriptors /  Indicators: Aching Pain Intervention(s): Limited activity within patient's tolerance;Repositioned     Hand Dominance Right   Extremity/Trunk Assessment Upper Extremity Assessment Upper Extremity Assessment: LUE deficits/detail LUE Deficits / Details: Pt's L UE in sling upon arrival and has NWBB orders placed for L UE. LUE: Unable to fully assess due to immobilization   Lower Extremity Assessment Lower Extremity Assessment: Generalized weakness   Cervical / Trunk Assessment Cervical / Trunk Assessment: Lordotic   Communication Communication Communication: HOH   Cognition Arousal/Alertness: Awake/alert Behavior During Therapy: WFL for tasks assessed/performed Overall Cognitive Status: Within Functional Limits for tasks assessed                                       General Comments       Exercises Other Exercises Other Exercises: Pt and grandson, Hulen Skains, educated on importance of mobility and exercises in order to maintain strength of LE's and keeping functional mobility.   Shoulder Instructions      Home Living Family/patient expects to be discharged to:: Private residence Living Arrangements: Other relatives Available Help at Discharge: Family;Available 24 hours/day;Available PRN/intermittently Type of Home: House Home Access: Stairs to enter Entrance Stairs-Number of Steps: 1 Threshold Entrance Stairs-Rails: None Home Layout: Two level Alternate Level Stairs-Number of Steps: 14 Alternate Level Stairs-Rails: Left Bathroom Shower/Tub: Walk-in shower;Tub/shower unit;Sponge bathes at baseline   Bathroom Toilet: Standard     Home Equipment: Conservation officer, nature (2 wheels);Cane - single point   Additional Comments: Pt does not use AD according to grandson.      Prior Functioning/Environment Prior Level of Function : Needs assist       Physical Assist : ADLs (physical)       ADLs Comments: Independent with dressing/self-care; Needs assistance with  cooking/cleaning        OT Problem List: Decreased strength;Decreased range of motion;Decreased activity tolerance;Impaired balance (sitting and/or standing);Decreased safety awareness;Impaired UE functional use      OT Treatment/Interventions: Self-care/ADL training;Therapeutic exercise;Energy conservation;DME and/or AE instruction;Therapeutic activities;Patient/family education;Balance training    OT Goals(Current goals can be found in the care plan section) Acute Rehab OT Goals Patient Stated Goal: to go home OT Goal Formulation: With patient/family Time For Goal Achievement: 06/06/21 Potential to Achieve Goals: Fair ADL Goals Pt Will Perform Grooming: with min guard assist;standing (c LRAD PRN) Pt Will Perform Lower Body Dressing: with min assist (c LRAD PRN) Pt Will Transfer to Toilet: with min guard assist;ambulating;regular height toilet (c LRAD PRN)  OT Frequency: Min 2X/week   Barriers to D/C:            Co-evaluation PT/OT/SLP Co-Evaluation/Treatment: Yes Reason for Co-Treatment: Complexity of the patient's impairments (multi-system involvement);For patient/therapist safety PT goals addressed during session: Mobility/safety with mobility OT goals addressed during session: ADL's and self-care      AM-PAC OT "6 Clicks" Daily Activity     Outcome Measure Help from another person eating meals?: A Little Help from another person taking care of personal grooming?: A Lot Help from another person toileting, which includes using toliet, bedpan, or urinal?: A Lot Help from another person bathing (including washing, rinsing, drying)?: A Little Help  from another person to put on and taking off regular upper body clothing?: A Lot Help from another person to put on and taking off regular lower body clothing?: A Lot 6 Click Score: 14   End of Session Equipment Utilized During Treatment: Gait belt Nurse Communication: Mobility status  Activity Tolerance: Patient tolerated  treatment well Patient left: in bed;with call bell/phone within reach;with bed alarm set;with nursing/sitter in room;with family/visitor present  OT Visit Diagnosis: Unsteadiness on feet (R26.81)                Time: 6503-5465 OT Time Calculation (min): 37 min Charges:  OT General Charges $OT Visit: 1 Visit OT Evaluation $OT Eval Moderate Complexity: 1 Mod OT Treatments $Self Care/Home Management : 8-22 mins  Dessie Coma, M.S. OTR/L  05/23/21, 4:07 PM  ascom [phone]

## 2021-05-23 NOTE — ED Notes (Signed)
Hospitalist at bedside 

## 2021-05-24 ENCOUNTER — Encounter: Payer: Self-pay | Admitting: Family Medicine

## 2021-05-24 LAB — CBC WITH DIFFERENTIAL/PLATELET
Abs Immature Granulocytes: 0.08 10*3/uL — ABNORMAL HIGH (ref 0.00–0.07)
Basophils Absolute: 0 10*3/uL (ref 0.0–0.1)
Basophils Relative: 0 %
Eosinophils Absolute: 0 10*3/uL (ref 0.0–0.5)
Eosinophils Relative: 0 %
HCT: 19.6 % — ABNORMAL LOW (ref 36.0–46.0)
Hemoglobin: 6.7 g/dL — ABNORMAL LOW (ref 12.0–15.0)
Immature Granulocytes: 1 %
Lymphocytes Relative: 16 %
Lymphs Abs: 1.8 10*3/uL (ref 0.7–4.0)
MCH: 32.4 pg (ref 26.0–34.0)
MCHC: 34.2 g/dL (ref 30.0–36.0)
MCV: 94.7 fL (ref 80.0–100.0)
Monocytes Absolute: 1.4 10*3/uL — ABNORMAL HIGH (ref 0.1–1.0)
Monocytes Relative: 13 %
Neutro Abs: 7.7 10*3/uL (ref 1.7–7.7)
Neutrophils Relative %: 70 %
Platelets: 166 10*3/uL (ref 150–400)
RBC: 2.07 MIL/uL — ABNORMAL LOW (ref 3.87–5.11)
RDW: 12.6 % (ref 11.5–15.5)
WBC: 11 10*3/uL — ABNORMAL HIGH (ref 4.0–10.5)
nRBC: 0 % (ref 0.0–0.2)

## 2021-05-24 LAB — BASIC METABOLIC PANEL
Anion gap: 3 — ABNORMAL LOW (ref 5–15)
Anion gap: 3 — ABNORMAL LOW (ref 5–15)
BUN: 19 mg/dL (ref 8–23)
BUN: 25 mg/dL — ABNORMAL HIGH (ref 8–23)
CO2: 22 mmol/L (ref 22–32)
CO2: 22 mmol/L (ref 22–32)
Calcium: 8.3 mg/dL — ABNORMAL LOW (ref 8.9–10.3)
Calcium: 8.9 mg/dL (ref 8.9–10.3)
Chloride: 100 mmol/L (ref 98–111)
Chloride: 101 mmol/L (ref 98–111)
Creatinine, Ser: 0.87 mg/dL (ref 0.44–1.00)
Creatinine, Ser: 1.12 mg/dL — ABNORMAL HIGH (ref 0.44–1.00)
GFR, Estimated: >60 mL/min (ref >60)
GFR, Estimated: 45 mL/min — ABNORMAL LOW (ref >60)
Glucose, Bld: 92 mg/dL (ref 70–99)
Glucose, Bld: 118 mg/dL — ABNORMAL HIGH (ref 70–99)
Potassium: 4.6 mmol/L (ref 3.5–5.1)
Potassium: 4.6 mmol/L (ref 3.5–5.1)
Sodium: 125 mmol/L — ABNORMAL LOW (ref 135–145)
Sodium: 126 mmol/L — ABNORMAL LOW (ref 135–145)

## 2021-05-24 LAB — CREATININE, SERUM
Creatinine, Ser: 0.79 mg/dL (ref 0.44–1.00)
GFR, Estimated: >60 mL/min (ref >60)

## 2021-05-24 LAB — ABO/RH: ABO/RH(D): A POS

## 2021-05-24 LAB — HEMOGLOBIN AND HEMATOCRIT, BLOOD
HCT: 24.3 % — ABNORMAL LOW (ref 36.0–46.0)
Hemoglobin: 8.5 g/dL — ABNORMAL LOW (ref 12.0–15.0)

## 2021-05-24 LAB — PREPARE RBC (CROSSMATCH)

## 2021-05-24 LAB — MAGNESIUM: Magnesium: 1.9 mg/dL (ref 1.7–2.4)

## 2021-05-24 MED ORDER — ACETAMINOPHEN 325 MG PO TABS
650.0000 mg | ORAL_TABLET | Freq: Four times a day (QID) | ORAL | Status: DC | PRN
  Administered 2021-05-24 – 2021-05-25 (×2): 650 mg via ORAL
  Filled 2021-05-24 (×2): qty 2

## 2021-05-24 MED ORDER — ACETAMINOPHEN 325 MG PO TABS
650.0000 mg | ORAL_TABLET | Freq: Once | ORAL | Status: AC
  Administered 2021-05-24: 14:00:00 650 mg via ORAL
  Filled 2021-05-24: qty 2

## 2021-05-24 MED ORDER — FUROSEMIDE 10 MG/ML IJ SOLN
20.0000 mg | Freq: Once | INTRAMUSCULAR | Status: AC
  Administered 2021-05-24: 14:00:00 20 mg via INTRAVENOUS
  Filled 2021-05-24: qty 4

## 2021-05-24 MED ORDER — ACETAMINOPHEN 650 MG RE SUPP
650.0000 mg | RECTAL | Status: DC | PRN
  Filled 2021-05-24: qty 1

## 2021-05-24 MED ORDER — SODIUM CHLORIDE 0.9% IV SOLUTION: Freq: Once | INTRAVENOUS | Status: AC

## 2021-05-24 MED ORDER — SODIUM CHLORIDE 1 G PO TABS
1.0000 g | ORAL_TABLET | Freq: Two times a day (BID) | ORAL | Status: DC
  Administered 2021-05-24 – 2021-05-26 (×4): 1 g via ORAL
  Filled 2021-05-24 (×7): qty 1

## 2021-05-24 MED ORDER — SODIUM CHLORIDE 0.9 % IV SOLN
2.0000 g | Freq: Two times a day (BID) | INTRAVENOUS | Status: DC
  Administered 2021-05-25: 05:00:00 2 g via INTRAVENOUS
  Filled 2021-05-24 (×2): qty 2

## 2021-05-24 MED ORDER — VANCOMYCIN HCL 750 MG/150ML IV SOLN
750.0000 mg | INTRAVENOUS | Status: DC
  Administered 2021-05-24: 18:00:00 750 mg via INTRAVENOUS
  Filled 2021-05-24 (×2): qty 150

## 2021-05-24 MED ORDER — DIPHENHYDRAMINE HCL 25 MG PO CAPS
25.0000 mg | ORAL_CAPSULE | Freq: Once | ORAL | Status: AC
  Administered 2021-05-24: 14:00:00 25 mg via ORAL
  Filled 2021-05-24: qty 1

## 2021-05-24 NOTE — TOC Initial Note (Signed)
Transition of Care [facility]) - Initial/Assessment Note    Patient Details  Name: Kylie May MRN: [id number] Date of Birth: Nov 19, 1921  Transition of [facility]) CM/SW Contact:    Beverly Sessions, RN Phone Number: 05/24/2021, 1:23 PM  Clinical Narrative:                  Patient admitted withy hypotension and hyponatremia Patient hard of hearing, completed assessment with Rich Fuchs who is at bedside   Patient lives at home with 2 grandsons.  He states that family rotates provided 24/7 care.   Pcp Caryl Bis  Denies issues obtaining medications   Patient has standard walker, cane and WC in the home Grandson agreeable to hospital bed, RW, and BSC at discharge.  Referral made to [facility] with [facility].  TOC to update [facility] 2 days prior to discharge.   PT recommending home health.  Grandson in agreement.  He request [facility]. Referral made and accepted by Merleen Nicely with [facility].  Start of care would be Tuesday.  TOC to confirm this is appropriate timeframe closer to discharge    Expected Discharge Plan: [place] Barriers to Discharge: Continued Medical Work up   Patient Goals and CMS Choice        Expected Discharge Plan and Services Expected Discharge Plan: [place]   Discharge Planning Services: CM Consult Post Acute Care Choice: [place] arrangements for the past 2 months: [place]                 DME Arranged: 3-N-1, Hospital bed, Walker rolling DME Agency: [facility] Date DME Agency Contacted: 05/24/21   Representative spoke with at DME Agency: [place] Arranged: RN, PT, OT, Nurse's Aide [place] Agency: [facility] Date [facility] Agency Contacted: 05/24/21   Representative spoke with at [place]: Merleen Nicely  Prior Living Arrangements/Services Living arrangements for the past 2 months: [place] with:: Relatives Patient language and need for interpreter reviewed:: Yes Do you feel safe going back  to the place where you live?: Yes      Need for Family Participation in Patient Care: Yes (Comment) Care giver support system in place?: Yes (comment) Current home services: DME Criminal Activity/Legal Involvement Pertinent to Current Situation/Hospitalization: No - Comment as needed  Activities of Daily Living Home Assistive Devices/Equipment: Eyeglasses, Grab bars around toilet, Hearing aid, Raised toilet seat with rails, Wheelchair, Environmental consultant (specify type) ADL Screening (condition at time of admission) Patient's cognitive ability adequate to safely complete daily activities?: Yes Is the patient deaf or have difficulty hearing?: Yes Does the patient have difficulty seeing, even when wearing glasses/contacts?: Yes Does the patient have difficulty concentrating, remembering, or making decisions?: No Patient able to express need for assistance with ADLs?: Yes Does the patient have difficulty dressing or bathing?: Yes Independently performs ADLs?: Yes (appropriate for developmental age) Does the patient have difficulty walking or climbing stairs?: Yes Weakness of Legs: Both Weakness of Arms/Hands: Left (post fall)  Permission Sought/Granted                  Emotional Assessment Appearance:: Appears stated age       Alcohol / Substance Use: Not Applicable Psych Involvement: No (comment)  Admission diagnosis:  Hypotension [I95.9] Lip laceration, initial encounter [S01.511A] Injury of head, initial encounter [S09.90XA] Fall, initial encounter [W19.XXXA] Closed fracture of proximal end of left humerus, unspecified fracture morphology, initial encounter [S42.202A] Sepsis, due to unspecified organism, unspecified  whether acute organ dysfunction present [facility]) [A41.9] Patient Active Problem List   Diagnosis Date Noted   Hypotension 05/23/2021   Hyponatremia 05/21/2021   Lab test positive for detection of COVID-19 virus 05/17/2021   Acute cough 05/17/2021   Hypothyroidism  10/05/2019   Allergic rhinitis 09/17/2019   History of cholecystitis 07/16/2019   Skin lesion 07/16/2019   Elevated LFTs 11/19/2018   Anemia 07/13/2018   Fall 10/31/2017   Abdominal aortic atherosclerosis ([place]) 09/16/2017   Rash 09/04/2017   Elevated TSH 09/04/2017   Chronic back pain 01/14/2017   Scoliosis 10/14/2016   Frequent falls 04/18/2016   Paroxysmal atrial fibrillation (HCC)    Mitral regurgitation    Chronic diastolic CHF (congestive heart failure) ([place])    Hearing loss 01/09/2010   Hyperlipidemia 08/10/2007   GERD (gastroesophageal reflux disease) 02/22/2007   PCP:  [name], MD Pharmacy:   [facility] [place], [place] [facility] [place] [address] [place] [address] Phone: [phone] Fax: [phone]  [facility] [place] [place] - [address] [place] [address] Phone: [phone] Fax: [phone]  [facility] [place], [place] [address] [place] [address] Phone: [phone] Fax: [phone]     Social Determinants of Health (SDOH) Interventions    Readmission Risk Interventions No flowsheet data found.

## 2021-05-24 NOTE — Progress Notes (Signed)
PROGRESS NOTE   Kylie May  DJM:[id number] DOB: 23-Jun-1921 DOA: 05/22/2021 PCP: [name], MD  Brief Narrative:   87 year old prior choledocholithiasis with septic shock status post cholecystotomy 08/21/2018, chronic A. fib RVR (since 2016) follows with Dr. Rockey Situ  Diagnosed with respiratory symptoms 12/12 eventually found to have COVID 12/15 which was subsequently felt to be a false positive as all the other testing was   [-]--was on Molnupiravir   Sustained fall 12/20 went to [facility] no fracture, on CT no intracranial bleeding found to have left lip swelling/bleeding--was to be discharged but found to be hypotensive and was felt to possibly have sepsis so was admitted Patient started on broad-spectrum antibiotics vancomycin/cefepime   Sodium on admission 126 lactic acid 2.1 procalcitonin 0.18 WBC 20 Hemoglobin 8.9 Platelet 184 Blood cultures 12/21 obtained CT head, CT neck only showing multilevel osteophytic disc disease C4-C7 no bleeding CT chest chronic compression fracture deformity sternum T10 L1 chronic fractures right superior and right inferior pubic rami and levoscoliosis lumbar spine       Hospital-Problem based course  Accidental fall --left humeral fracture Will follow-up with [facility] as per discussion with Dr. Sharlet Salina on 12/21 [facility] is non[facility]bearing to left upper extremity Therapy is recommending home health Tylenol 650 every 4 as needed pain-avoid opiates lip laceration and acute blood loss anemia Patient on Eliquis and had significant bleeding until sutures were placed--hemoglobin down to 6.7 Transfuse 1 unit PRBC after informed consent with family ?  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 Mikey Bussing restricting to 1200 1500 cc, salt tab 1 g twice daily and stop IV fluid Reassess in a.m. Chronic A. fib RVR Continue amiodarone 200, Lopressor 25 twice daily Okay to continue apixaban 2.5 twice daily False positive COVID PCR negative does not need isolation  DVT prophylaxis: Eliquis Code Status: Full Family Communication: Discussed in detail with grandson at the bedside Disposition:  Status is: Inpatient  Remains inpatient appropriate because: Need for transfusion and stabilization       Consultants:  Discussed on phone with Dr. Sharlet Salina orthopedics   Procedures: No  Antimicrobials:   As above  Subjective: Remains well-extremely hard of hearing but no distress No bleeding from anywhere no chest pain Arm pains a little bit as does her chest She has intermittent cough  Objective: Vitals:   05/23/21 2100 05/23/21 2156 05/24/21 0633 05/24/21 0808  BP: (!) 142/55 (!) 106/51 (!) 115/53 (!) 103/42  Pulse: 65 70 75 70  Resp: 18 16 16 16   Temp:  98.5 F (36.9 C) 98.8 F (37.1 C) 97.9 F (36.6 C)  TempSrc:  Oral Oral   SpO2: 94% 97% 94% 96%  Weight:   50.8 kg   Height:        Intake/Output Summary (Last 24 hours) at 05/24/2021 1236 Last data filed at 05/24/2021 2229 Gross per 24 hour  Intake 1198.06 ml  Output --  Net 1198.06 ml   Filed Weights   05/22/21 2052 05/24/21 7989  Weight: 43.1 kg 50.8 kg    Examination:  Awake coherent pleasant white female quite frail S1-S2 irregularly irregular rate controlled Chest clear no rales rhonchi Abdomen soft drain in place in right upper quadrant No lower extremity edema  ROM left upper extremity limited because of sling and pain Neurologically overall is intact  Data Reviewed: personally reviewed   CBC    Component Value Date/Time   WBC 11.0 (H) 05/24/2021 1048   RBC 2.07 (L) 05/24/2021 1048   HGB 6.7 (L) 05/24/2021 1048   HGB 11.6 (L) 09/17/2014 1208   HCT 19.6 (L)  05/24/2021 1048   HCT 39.6 09/16/2014 0032   PLT 166 05/24/2021 1048   PLT 261 09/16/2014 0032   MCV 94.7 05/24/2021 1048   MCV 91 09/16/2014 0032   MCH 32.4 05/24/2021 1048   MCHC 34.2 05/24/2021 1048   RDW 12.6 05/24/2021 1048   RDW 15.6 (H) 09/16/2014 0032   LYMPHSABS 1.8 05/24/2021 1048   MONOABS 1.4 (H) 05/24/2021 1048   EOSABS 0.0 05/24/2021 1048   BASOSABS 0.0 05/24/2021 1048   CMP Latest Ref Rng & Units 05/24/2021 05/24/2021 05/23/2021  Glucose 70 - 99 mg/dL 92 - 129(H)  BUN 8 - 23 mg/dL 19 - 17  Creatinine 0.44 - 1.00 mg/dL 0.87 0.79 0.89  Sodium 135 - 145 mmol/L 125(L) - 126(L)  Potassium 3.5 - 5.1 mmol/L 4.6 - 4.6  Chloride 98 - 111 mmol/L 100 - 99  CO2 22 - 32 mmol/L 22 - 23  Calcium 8.9 - 10.3 mg/dL 8.3(L) - 8.2(L)  Total Protein 6.5 - 8.1 g/dL - - -  Total Bilirubin 0.3 - 1.2 mg/dL - - -  Alkaline Phos 38 - 126 U/L - - -  AST 15 - 41 U/L - - -  ALT 0 - 44 U/L - - -     Radiology Studies: CT HEAD WO CONTRAST (5MM)  Result Date: 05/22/2021 CLINICAL DATA:  Fall, blunt trauma. EXAM: CT HEAD WITHOUT CONTRAST CT CERVICAL SPINE WITHOUT CONTRAST TECHNIQUE: Multidetector CT imaging of the head and cervical spine was performed following the standard protocol without intravenous contrast. Multiplanar CT image reconstructions of the cervical spine were also generated. COMPARISON:  None. FINDINGS: CT HEAD FINDINGS Brain: No acute intracranial hemorrhage. No focal mass lesion. No CT evidence of acute infarction. No midline shift or mass effect. No hydrocephalus. Basilar cisterns are patent. There are periventricular and subcortical white matter hypodensities. Generalized cortical atrophy. Vascular: No hyperdense vessel or unexpected calcification. Skull: Normal. Negative for fracture or focal lesion. Sinuses/Orbits: Paranasal sinuses and mastoid air cells are clear. Orbits are clear. Other: None. CT CERVICAL SPINE FINDINGS Alignment: Normal alignment of the cervical vertebral  bodies. Pannus posterior to the C2 the dens . Skull base and vertebrae: Normal craniocervical junction. No loss of vertebral body height or disc height. Normal facet articulation. No evidence of fracture. Soft tissues and spinal canal: No prevertebral soft tissue swelling. No perispinal or epidural hematoma. Disc levels: There is joint space narrowing endplate spurring and sclerosis from C4-C7. No acute findings. Upper chest: Clear Other: None IMPRESSION: 1. No intracranial trauma. 2. Age-appropriate atrophy and microvascular disease. 3. No cervical spine fracture. 4. Multilevel disc osteophytic disease. Electronically Signed   By: Suzy Bouchard M.D.   On: 05/22/2021 22:04   CT Cervical Spine Wo Contrast  Result Date: 05/22/2021 CLINICAL DATA:  Fall, blunt trauma. EXAM: CT HEAD WITHOUT CONTRAST CT CERVICAL SPINE WITHOUT CONTRAST TECHNIQUE: Multidetector CT imaging of the head and cervical spine was performed following the standard protocol without intravenous contrast. Multiplanar CT image reconstructions of the cervical spine were also generated. COMPARISON:  None. FINDINGS: CT HEAD FINDINGS Brain: No acute intracranial hemorrhage. No focal mass lesion. No CT  evidence of acute infarction. No midline shift or mass effect. No hydrocephalus. Basilar cisterns are patent. There are periventricular and subcortical white matter hypodensities. Generalized cortical atrophy. Vascular: No hyperdense vessel or unexpected calcification. Skull: Normal. Negative for fracture or focal lesion. Sinuses/Orbits: Paranasal sinuses and mastoid air cells are clear. Orbits are clear. Other: None. CT CERVICAL SPINE FINDINGS Alignment: Normal alignment of the cervical vertebral bodies. Pannus posterior to the C2 the dens . Skull base and vertebrae: Normal craniocervical junction. No loss of vertebral body height or disc height. Normal facet articulation. No evidence of fracture. Soft tissues and spinal canal: No prevertebral soft  tissue swelling. No perispinal or epidural hematoma. Disc levels: There is joint space narrowing endplate spurring and sclerosis from C4-C7. No acute findings. Upper chest: Clear Other: None IMPRESSION: 1. No intracranial trauma. 2. Age-appropriate atrophy and microvascular disease. 3. No cervical spine fracture. 4. Multilevel disc osteophytic disease. Electronically Signed   By: Suzy Bouchard M.D.   On: 05/22/2021 22:04   DG Chest Portable 1 View  Result Date: 05/22/2021 CLINICAL DATA:  fall, hypotensive EXAM: PORTABLE CHEST 1 VIEW COMPARISON:  Chest x-ray 05/17/2021, CT chest 07/05/2018 FINDINGS: The heart and mediastinal contours are unchanged. Aortic calcification. Left base streaky airspace opacity likely represent atelectasis. No focal consolidation. No pulmonary edema. No pleural effusion. No pneumothorax. No acute osseous abnormality.  Scoliosis. Right upper quadrant drain is noted. IMPRESSION: No active disease. Electronically Signed   By: Iven Finn M.D.   On: 05/22/2021 23:19   CT CHEST ABDOMEN PELVIS WO CONTRAST  Result Date: 05/23/2021 CLINICAL DATA:  Status post trauma. EXAM: CT CHEST, ABDOMEN AND PELVIS WITHOUT CONTRAST TECHNIQUE: Multidetector CT imaging of the chest, abdomen and pelvis was performed following the standard protocol without IV contrast. COMPARISON:  August 14, 2018 FINDINGS: CT CHEST FINDINGS Cardiovascular: There is moderate to marked severity calcification of the thoracic aorta. Normal heart size. No pericardial effusion. Mediastinum/Nodes: No enlarged mediastinal, hilar, or axillary lymph nodes. Thyroid gland, trachea, and esophagus demonstrate no significant findings. Lungs/Pleura: Mild linear scarring and/or atelectasis is seen within the posterior aspect of the right upper lobe and bilateral lung bases. There is no evidence of a pleural effusion or pneumothorax. Musculoskeletal: A chronic fracture deformity is seen involving the body of the sternum. Additional  chronic compression fracture deformity of the T10 vertebral body is seen. CT ABDOMEN PELVIS FINDINGS Hepatobiliary: No focal liver abnormality is seen. A percutaneous biliary drainage catheter is noted. Status post cholecystectomy. No biliary dilatation. Pancreas: Unremarkable. No pancreatic ductal dilatation or surrounding inflammatory changes. Spleen: Normal in size without focal abnormality. Adrenals/Urinary Tract: Adrenal glands are unremarkable. Kidneys are normal, without renal calculi, focal lesion, or hydronephrosis. Bladder is unremarkable. Stomach/Bowel: There is a small to moderate sized hiatal hernia. Appendix appears normal. No evidence of bowel wall thickening, distention, or inflammatory changes. Noninflamed diverticula are seen throughout the sigmoid colon. Vascular/Lymphatic: Aortic atherosclerosis. No enlarged abdominal or pelvic lymph nodes. Reproductive: Uterus and bilateral adnexa are unremarkable. Other: No abdominal wall hernia or abnormality. No abdominopelvic ascites. Musculoskeletal: There is a chronic compression fracture deformity of the L1 vertebral body. Additional chronic fractures of the right superior and right inferior pubic rami are seen. There is marked severity levoscoliosis of the lumbar spine. IMPRESSION: 1. No evidence of acute traumatic injury within the chest, abdomen or pelvis. 2. Chronic compression fracture deformities of the sternum, T10 and L1 vertebral bodies. 3. Sigmoid diverticulosis. 4. Small to moderate sized hiatal hernia. 5. Chronic fractures  of the right superior and right inferior pubic rami. 6. Marked severity levoscoliosis of the lumbar spine. 7. Aortic atherosclerosis. Aortic Atherosclerosis (ICD10-I70.0). Electronically Signed   By: Virgina Norfolk M.D.   On: 05/23/2021 00:03     Scheduled Meds:  sodium chloride   Intravenous Once   acetaminophen  650 mg Oral Once   amiodarone  200 mg Oral Daily   apixaban  2.5 mg Oral BID   cholecalciferol  1,000  Units Oral Daily   diphenhydrAMINE  25 mg Oral Once   ferrous sulfate  325 mg Oral Daily   fluticasone  2 spray Each Nare Daily   furosemide  20 mg Intravenous Once   levothyroxine  50 mcg Oral Q0600   magnesium chloride  2 tablet Oral Daily   metoprolol tartrate  25 mg Oral BID   pantoprazole  40 mg Oral Daily   rosuvastatin  20 mg Oral QHS   sodium chloride  1 g Oral BID WC   Continuous Infusions:  ceFEPime (MAXIPIME) IV     vancomycin       LOS: 1 day   Time spent: Little Browning, MD [facility] To contact the attending provider between 7A-7P or the covering provider during after hours 7P-7A, please log into the web site [email or web address] and access using universal [place] password for that web site. If you do not have the password, please call the hospital operator.  05/24/2021, 12:36 PM

## 2021-05-24 NOTE — Progress Notes (Cosign Needed Addendum)
°  °  Durable Medical Equipment  (From admission, onward)           Start     Ordered   05/24/21 1332  For home use only DME Bedside commode  Once       Question:  Patient needs a bedside commode to treat with the following condition  Answer:  Weakness   05/24/21 1332   05/24/21 1332  For home use only DME Walker rolling  Once       Question Answer Comment  Walker: With [place]   Patient needs a walker to treat with the following condition Weakness      05/24/21 1332   05/24/21 1332  For home use only DME Hospital bed  Once       Question Answer Comment  Length of Need Lifetime   Patient has (list medical condition): multilevel osteophytic disc disease C4-C7   The above medical condition requires: Patient requires the ability to reposition frequently   Head must be elevated greater than: 30 degrees   Bed type Semi-electric   Hoyer Lift Yes   Support Surface: Gel Overlay      05/24/21 1332

## 2021-05-24 NOTE — Consult Note (Signed)
Pharmacy Antibiotic Note  Kylie May is a 87 y.o. female admitted on 05/22/2021 with  fall / sepsis rule out. Per chart review, source of suspected sepsis remains elusive. Pharmacy has been consulted for vancomycin and cefepime dosing.  Plan:  Adjust cefepime to 2 g IV q12h (CrCl 30 - 60 mL/min)  Adjust vancomycin to 750 mg IV q36h --Calculated AUC: 438, Cmin 10.2 --Daily Scr per protocol --Levels at steady state as clinically indicated  Height: 5\' 4"  (162.6 cm) Weight: 50.8 kg (111 lb 14.4 oz) IBW/kg (Calculated) : 54.7  Temp (24hrs), Avg:98.4 F (36.9 C), Min:97.9 F (36.6 C), Max:98.8 F (37.1 C)  Recent Labs  Lab 05/22/21 2348 05/23/21 0015 05/23/21 0136 05/23/21 0410 05/23/21 0511 05/24/21 0502 05/24/21 1048  WBC  --  20.9*  --   --  16.7*  --  11.0*  CREATININE  --   --  0.96  --  0.89 0.87   0.79  --   LATICACIDVEN 2.3*  --   --  2.1*  --   --   --     Estimated Creatinine Clearance: 32.2 mL/min (by C-G formula based on SCr of 0.79 mg/dL).    Allergies  Allergen Reactions   Metronidazole     Other reaction(s): Dizziness and giddiness (finding), Other (qualifier value) made things feel like they were vibrating like the bed and floor,also caused mild dizziness weakness   Penicillin G Rash    Antimicrobials this admission: Cefepime 12/21 >>  Vancomycin 12/21 >>   Microbiology results: 12/21 BCx: NGTD 12/21 UCx: pending   Thank you for allowing pharmacy to be a part of this patients care.  Benita Gutter 05/24/2021 12:12 PM

## 2021-05-25 ENCOUNTER — Inpatient Hospital Stay: Payer: Medicare Other | Admitting: Radiology

## 2021-05-25 ENCOUNTER — Inpatient Hospital Stay (HOSPITAL_COMMUNITY)
Admission: RE | Admit: 2021-05-25 | Discharge: 2021-05-25 | Disposition: A | Discharge status: Home / Self Care | Payer: TRICARE For Life (TFL) | Source: Ambulatory Visit | Attending: Interventional Radiology | Admitting: Interventional Radiology

## 2021-05-25 HISTORY — PX: IR EXCHANGE BILIARY DRAIN: IMG6046

## 2021-05-25 LAB — CREATININE, SERUM
Creatinine, Ser: 1.05 mg/dL — ABNORMAL HIGH (ref 0.44–1.00)
GFR, Estimated: 48 mL/min — ABNORMAL LOW (ref >60)

## 2021-05-25 LAB — COMPREHENSIVE METABOLIC PANEL
ALT: 88 U/L — ABNORMAL HIGH (ref 0–44)
AST: 97 U/L — ABNORMAL HIGH (ref 15–41)
Albumin: 2.4 g/dL — ABNORMAL LOW (ref 3.5–5.0)
Alkaline Phosphatase: 48 U/L (ref 38–126)
Anion gap: 3 — ABNORMAL LOW (ref 5–15)
BUN: 25 mg/dL — ABNORMAL HIGH (ref 8–23)
CO2: 21 mmol/L — ABNORMAL LOW (ref 22–32)
Calcium: 8.7 mg/dL — ABNORMAL LOW (ref 8.9–10.3)
Chloride: 103 mmol/L (ref 98–111)
Creatinine, Ser: 1.1 mg/dL — ABNORMAL HIGH (ref 0.44–1.00)
GFR, Estimated: 46 mL/min — ABNORMAL LOW (ref >60)
Glucose, Bld: 121 mg/dL — ABNORMAL HIGH (ref 70–99)
Potassium: 4.4 mmol/L (ref 3.5–5.1)
Sodium: 127 mmol/L — ABNORMAL LOW (ref 135–145)
Total Bilirubin: 0.9 mg/dL (ref 0.3–1.2)
Total Protein: 5 g/dL — ABNORMAL LOW (ref 6.5–8.1)

## 2021-05-25 LAB — CBC WITH DIFFERENTIAL/PLATELET
Abs Immature Granulocytes: 0.16 10*3/uL — ABNORMAL HIGH (ref 0.00–0.07)
Basophils Absolute: 0.1 10*3/uL (ref 0.0–0.1)
Basophils Relative: 0 %
Eosinophils Absolute: 0.2 10*3/uL (ref 0.0–0.5)
Eosinophils Relative: 2 %
HCT: 24.4 % — ABNORMAL LOW (ref 36.0–46.0)
Hemoglobin: 8.4 g/dL — ABNORMAL LOW (ref 12.0–15.0)
Immature Granulocytes: 1 %
Lymphocytes Relative: 20 %
Lymphs Abs: 2.2 10*3/uL (ref 0.7–4.0)
MCH: 31.7 pg (ref 26.0–34.0)
MCHC: 34.4 g/dL (ref 30.0–36.0)
MCV: 92.1 fL (ref 80.0–100.0)
Monocytes Absolute: 1.2 10*3/uL — ABNORMAL HIGH (ref 0.1–1.0)
Monocytes Relative: 10 %
Neutro Abs: 7.6 10*3/uL (ref 1.7–7.7)
Neutrophils Relative %: 67 %
Platelets: 177 10*3/uL (ref 150–400)
RBC: 2.65 MIL/uL — ABNORMAL LOW (ref 3.87–5.11)
RDW: 13.6 % (ref 11.5–15.5)
WBC: 11.4 10*3/uL — ABNORMAL HIGH (ref 4.0–10.5)
nRBC: 0 % (ref 0.0–0.2)

## 2021-05-25 LAB — TYPE AND SCREEN
ABO/RH(D): A POS
Antibody Screen: NEGATIVE
Unit division: 0

## 2021-05-25 LAB — URINE CULTURE: Culture: NO GROWTH

## 2021-05-25 LAB — BPAM RBC
Blood Product Expiration Date: 202301192359
ISSUE DATE / TIME: 202212221534
Unit Type and Rh: 6200

## 2021-05-25 MED ORDER — CEFEPIME HCL 2 G IJ SOLR: 2.0000 g | INTRAMUSCULAR | Status: DC

## 2021-05-25 MED ORDER — IOHEXOL 350 MG/ML SOLN
6.0000 mL | Freq: Once | INTRAVENOUS | Status: AC | PRN
  Administered 2021-05-25: 16:00:00 6 mL

## 2021-05-25 MED ORDER — LIDOCAINE HCL 1 % IJ SOLN
INTRAMUSCULAR | Status: AC
  Administered 2021-05-25: 15:00:00 3 mL
  Filled 2021-05-25: qty 20

## 2021-05-25 NOTE — Progress Notes (Signed)
PROGRESS NOTE   Kylie May  XFG:[id number] DOB: [birth year] DOA: 05/22/2021 PCP: [name], MD  Brief Narrative:   87 year old prior choledocholithiasis with septic shock status post cholecystotomy 08/21/2018, chronic A. fib RVR (since 2016) follows with Dr. Rockey Situ  Diagnosed with respiratory symptoms 12/12 eventually found to have COVID 12/15 which was subsequently felt to be a false positive as all the other testing was   [-]--was on Molnupiravir   Sustained fall 12/20 went to [facility] no fracture, on CT no intracranial bleeding found to have left lip swelling/bleeding--was to be discharged but found to be hypotensive and was felt to possibly have sepsis so was admitted Patient started on broad-spectrum antibiotics vancomycin/cefepime   Sodium on admission 126 lactic acid 2.1 procalcitonin 0.18 WBC 20 Hemoglobin 8.9 Platelet 184 Blood cultures 12/21 obtained CT head, CT neck only showing multilevel osteophytic disc disease C4-C7 no bleeding CT chest chronic compression fracture deformity sternum T10 L1 chronic fractures right superior and right inferior pubic rami and levoscoliosis lumbar spine       Hospital-Problem based course  Accidental fall --left humeral fracture Will follow-up [facility] per Dr. Sharlet Salina -has appt is nonweightbearing to left upper extremity Therapy is recommending home health Tylenol 650 every 4 as needed pain-avoid opiates lip laceration and acute blood loss anemia Sutures can be removed at PCP in ~ 14 days from accident Patient on Eliquis and had significant bleeding until sutures were placed Transfuse 1 unit PRBC, hemoglobin 6-->8.4 ?  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 Zollie Scale on  12/22 who gave verbal recs--neprho available for in person consult if no better -liberalize slightly fluid restricting to 1800 cc as slight increase in BUN salt tab 1 g twice daily--IVF stopped Reassess in a.m.--sodium needs to be above 130 to d/c home Chronic A. fib RVR Continue amiodarone 200, Lopressor 25 twice daily Okay to continue apixaban 2.5 twice daily False positive COVID PCR negative does not need isolation  DVT prophylaxis: Eliquis Code Status: Full Family Communication: Discussed in detail with grandson at the bedside on several days Disposition:  Status is: Inpatient  Remains inpatient appropriate because: Need for transfusion and stabilization       Consultants:  Discussed on phone with Dr. Sharlet Salina orthopedics  D/w Dr. Holley Raring on 12/22  Procedures: No  Antimicrobials:   As above  Subjective: Remains well-extremely hard of hearing but no distress  Tired form therapy no n/v Eating minimally--drinking electrolytes Overall no other c/o   Objective: Vitals:   05/24/21 1759 05/25/21 0418 05/25/21 0759 05/25/21 0935  BP: (!) 107/52 (!) 130/49 (!) 157/56 (!) 135/50  Pulse: 75 68 66 67  Resp: 18 20 16    Temp: 98.2 F (36.8 C) 98.1 F (36.7 C) 97.7 F (36.5 C)   TempSrc: Axillary     SpO2: 92% 93% 92%   Weight:      Height:        Intake/Output Summary (Last 24 hours) at 05/25/2021 1344 Last data filed at 05/25/2021 1030 Gross per 24 hour  Intake 387 ml  Output 850 ml  Net -463 ml    Filed Weights   05/22/21 2052 05/24/21 0633  Weight: 43.1 kg 50.8 kg    Examination:  Slight sleepy white female  quite frail-some bruising to mouth with sutures S1-S2 irregularly irregular rate controlled Chest clear no rales rhonchi Abdomen soft , no HSMdrain in place in right upper quadrant, didn't examine skin today No lower extremity edema ROM left upper extremity limited because of sling and pain Neurologically overall is intact  Data Reviewed:  personally reviewed   CBC    Component Value Date/Time   WBC 11.4 (H) 05/25/2021 0813   RBC 2.65 (L) 05/25/2021 0813   HGB 8.4 (L) 05/25/2021 0813   HGB 11.6 (L) 09/17/2014 1208   HCT 24.4 (L) 05/25/2021 0813   HCT 39.6 09/16/2014 0032   PLT 177 05/25/2021 0813   PLT 261 09/16/2014 0032   MCV 92.1 05/25/2021 0813   MCV 91 09/16/2014 0032   MCH 31.7 05/25/2021 0813   MCHC 34.4 05/25/2021 0813   RDW 13.6 05/25/2021 0813   RDW 15.6 (H) 09/16/2014 0032   LYMPHSABS 2.2 05/25/2021 0813   MONOABS 1.2 (H) 05/25/2021 0813   EOSABS 0.2 05/25/2021 0813   BASOSABS 0.1 05/25/2021 0813   CMP Latest Ref Rng & Units 05/25/2021 05/25/2021 05/24/2021  Glucose 70 - 99 mg/dL 121(H) - 118(H)  BUN 8 - 23 mg/dL 25(H) - 25(H)  Creatinine 0.44 - 1.00 mg/dL 1.10(H) 1.05(H) 1.12(H)  Sodium 135 - 145 mmol/L 127(L) - 126(L)  Potassium 3.5 - 5.1 mmol/L 4.4 - 4.6  Chloride 98 - 111 mmol/L 103 - 101  CO2 22 - 32 mmol/L 21(L) - 22  Calcium 8.9 - 10.3 mg/dL 8.7(L) - 8.9  Total Protein 6.5 - 8.1 g/dL 5.0(L) - -  Total Bilirubin 0.3 - 1.2 mg/dL 0.9 - -  Alkaline Phos 38 - 126 U/L 48 - -  AST 15 - 41 U/L 97(H) - -  ALT 0 - 44 U/L 88(H) - -     Radiology Studies: No results found.   Scheduled Meds:  amiodarone  200 mg Oral Daily   apixaban  2.5 mg Oral BID   cholecalciferol  1,000 Units Oral Daily   ferrous sulfate  325 mg Oral Daily   fluticasone  2 spray Each Nare Daily   levothyroxine  50 mcg Oral Q0600   magnesium chloride  2 tablet Oral Daily   metoprolol tartrate  25 mg Oral BID   pantoprazole  40 mg Oral Daily   rosuvastatin  20 mg Oral QHS   sodium chloride  1 g Oral BID WC   Continuous Infusions:     LOS: 2 days   Time spent: 28  Nita Sells, MD [facility] To contact the attending provider between 7A-7P or the covering provider during after hours 7P-7A, please log into the web site [email or web address] and access using universal [place] password for that web site.  If you do not have the password, please call the hospital operator.  05/25/2021, 1:44 PM

## 2021-05-25 NOTE — Consult Note (Signed)
Pharmacy Antibiotic Note  Kylie May is a 87 y.o. female admitted on 05/22/2021 with  fall / sepsis rule out. Per chart review, source of suspected sepsis remains elusive. Pharmacy has been consulted for vancomycin and cefepime dosing.  Plan:  Adjust cefepime to 2 g IV q24h (CrCl < 30 mL/min)  Continue vancomycin at 750 mg IV q36h for now --Calculated AUC: 551, Cmin 14.6 --Daily Scr per protocol; trending up. If therapy continued, dose change may be warranted depending on trend --Levels at steady state as clinically indicated  Height: 5\' 4"  (162.6 cm) Weight: 50.8 kg (111 lb 14.4 oz) IBW/kg (Calculated) : 54.7  Temp (24hrs), Avg:98.8 F (37.1 C), Min:97.9 F (36.6 C), Max:99.7 F (37.6 C)  Recent Labs  Lab 05/22/21 2348 05/23/21 0015 05/23/21 0136 05/23/21 0410 05/23/21 0511 05/24/21 0502 05/24/21 1048 05/24/21 2304 05/25/21 0446  WBC  --  20.9*  --   --  16.7*  --  11.0*  --   --   CREATININE  --   --  0.96  --  0.89 0.87   0.79  --  1.12* 1.05*  LATICACIDVEN 2.3*  --   --  2.1*  --   --   --   --   --      Estimated Creatinine Clearance: 24.6 mL/min (A) (by C-G formula based on SCr of 1.05 mg/dL (H)).    Allergies  Allergen Reactions   Metronidazole     Other reaction(s): Dizziness and giddiness (finding), Other (qualifier value) made things feel like they were vibrating like the bed and floor,also caused mild dizziness weakness   Penicillin G Rash    Antimicrobials this admission: Cefepime 12/21 >>  Vancomycin 12/21 >>   Microbiology results: 12/21 BCx: NGTD 12/21 UCx: pending   Thank you for allowing pharmacy to be a part of this patients care.  Benita Gutter 05/25/2021 7:29 AM

## 2021-05-25 NOTE — Progress Notes (Addendum)
Occupational Therapy Treatment Patient Details Name: Kylie May MRN: [id number] DOB: [birth year] Today's Date: 05/25/2021   History of present illness OZIE DIMARIA is a 87 y.o. Caucasian female with medical history significant for atrial fibrillation on Eliquis, GERD, diastolic CHF, diverticulosis, diverticulitis, lumbar compression fracture, dyslipidemia, mitral valve prolapse with regurgitation, history of pancreatitis and history of Esophageal stricture, who presented to the ER with acute onset fall with subsequent left  shoulder injury and lip laceration.   OT comments  Ms Jain was seen for OT/PT co-treatment on this date. Upon arrival to room pt reclined in bed, grandson at bedside to assist with communication/education, pt agreeable to tx. Pt requires MIN A + HHA for toilet t/f, +2 for lines/leads mgmt. SBA perihygiene in sitting. MAX A don/doff sling in sitting. MAX A don/doff socks at bed level. Pt and grandson, Ysidro Evert, educated on: adapted dressing technqiues, functional application of [employer] pcns, etensive DME recs and d/c recs, home/routines mgmt. Pt making good progress toward goals. Pt continues to benefit from skilled OT services to maximize return to PLOF and minimize risk of future falls, injury, caregiver burden, and readmission. Will continue to follow POC. Discharge recommendation remains appropriate.     Recommendations for follow up therapy are one component of a multi-disciplinary discharge planning process, led by the attending physician.  Recommendations may be updated based on patient status, additional functional criteria and insurance authorization.    Follow Up Recommendations  Home health OT    Assistance Recommended at Discharge Frequent or constant Supervision/Assistance  Equipment Recommendations  BSC/3in1;Hospital bed    Recommendations for Other Services      Precautions / Restrictions Precautions Precautions: Fall Restrictions Weight  Bearing Restrictions: Yes LUE Weight Bearing: Non weight bearing       Mobility Bed Mobility Overal bed mobility: Needs Assistance Bed Mobility: Supine to Sit     Supine to sit: Mod assist          Transfers Overall transfer level: Needs assistance Equipment used: 1 person hand held assist Transfers: Sit to/from Stand Sit to Stand: Min assist           General transfer comment: rises without UE support from elevated toilet height     Balance Overall balance assessment: Needs assistance Sitting-balance support: Single extremity supported;Feet unsupported Sitting balance-Leahy Scale: Fair     Standing balance support: Single extremity supported;During functional activity;Reliant on assistive device for balance Standing balance-Leahy Scale: Fair Standing balance comment: impulsive                           ADL either performed or assessed with clinical judgement   ADL Overall ADL's : Needs assistance/impaired                                       General ADL Comments: MIN A + HHA for toilet t/f, +2 for lines/leads mgmt. SBA perihygiene in sitting. MAX A don/doff sling in sitting. MAX A don/doff socks at bed level.      Cognition Arousal/Alertness: Awake/alert Behavior During Therapy: WFL for tasks assessed/performed Overall Cognitive Status: Within Functional Limits for tasks assessed  Exercises Exercises: Other exercises Other Exercises Other Exercises: Pt and grandson, Ysidro Evert, educated on: adapted dressing technqiues, functional application of [employer] pcns, etensive DME recs and d/c recs, home/routines mgmt           Pertinent Vitals/ Pain       Pain Assessment: 0-10 Pain Score: 6  Pain Location: back and headache Pain Descriptors / Indicators: Aching Pain Intervention(s): Limited activity within patient's tolerance;RN gave pain meds during session   Frequency   Min 2X/week        Progress Toward Goals  OT Goals(current goals can now be found in the care plan section)  Progress towards OT goals: Progressing toward goals  Acute Rehab OT Goals Patient Stated Goal: to go home OT Goal Formulation: With patient/family Time For Goal Achievement: 06/06/21 Potential to Achieve Goals: Fair ADL Goals Pt Will Perform Grooming: with min guard assist;standing Pt Will Perform Lower Body Dressing: with min assist Pt Will Transfer to Toilet: with min guard assist;ambulating;regular height toilet  Plan Discharge plan remains appropriate;Frequency remains appropriate    Co-evaluation    PT/OT/SLP Co-Evaluation/Treatment: Yes Reason for Co-Treatment: Complexity of the patient's impairments (multi-system involvement);For patient/therapist safety;To address functional/ADL transfers PT goals addressed during session: Mobility/safety with mobility OT goals addressed during session: ADL's and self-care      AM-PAC OT "6 Clicks" Daily Activity     Outcome Measure   Help from another person eating meals?: A Little Help from another person taking care of personal grooming?: A Lot Help from another person toileting, which includes using toliet, bedpan, or urinal?: A Lot Help from another person bathing (including washing, rinsing, drying)?: A Little Help from another person to put on and taking off regular upper body clothing?: A Lot Help from another person to put on and taking off regular lower body clothing?: A Lot 6 Click Score: 14    End of Session Equipment Utilized During Treatment: Gait belt  OT Visit Diagnosis: Unsteadiness on feet (R26.81)   Activity Tolerance Patient tolerated treatment well   Patient Left in chair;with call bell/phone within reach;with family/visitor present   Nurse Communication Mobility status        Time: 1287-8676 OT Time Calculation (min): 49 min  Charges: OT General Charges $OT Visit: 1 Visit OT  Treatments $Self Care/Home Management : 23-37 mins  Dessie Coma, M.S. OTR/L  05/25/21, 12:25 PM  ascom [phone]

## 2021-05-25 NOTE — Progress Notes (Signed)
Physical Therapy Treatment Patient Details Name: Kylie May MRN: [id number] DOB: [birth year] Today's Date: 05/25/2021   History of Present Illness JONET MATHIES is a 87 y.o. Caucasian female with medical history significant for atrial fibrillation on Eliquis, GERD, diastolic CHF, diverticulosis, diverticulitis, lumbar compression fracture, dyslipidemia, mitral valve prolapse with regurgitation, history of pancreatitis and history of Esophageal stricture, who presented to the ER with acute onset fall with subsequent left  shoulder injury and lip laceration.    PT Comments    Pt received in Semi-Fowler's position and agreeable to therapy.  Pt's grandson, Ysidro Evert, present in room upon entering and remained in room throughout session.  Pt's grandson coninued to assist with communicating with pt through text message on phone for pt to read.  Pt requested to attempt to use the bathroom and was able to perform transfer with +2 assistance for management of leads and HHA using the R UE.  Pt very quick with mobility due to needing to use the restroom.  Pt also fatigues quickly and performs transfer to the recliner with lateral weight shift with unsafe motion due to fatigue.  Pt's grandson states she normally performs transfer that way and was educated on preventing.  Ysidro Evert also asked several questions about home situations and rugs were advised to be removed, equipment recommendations were mentioned, and all other questions answered.  Pt left in recliner with all needs met and call bell within reach.  Current discharge plans to home with HHPT remain appropriate at this time.  Pt will continue to benefit from skilled therapy in order to address deficits listed below.     Recommendations for follow up therapy are one component of a multi-disciplinary discharge planning process, led by the attending physician.  Recommendations may be updated based on patient status, additional functional criteria and  insurance authorization.  Follow Up Recommendations  Home health PT     Assistance Recommended at Discharge Frequent or constant Supervision/Assistance  Equipment Recommendations  Cane    Recommendations for Other Services       Precautions / Restrictions Precautions Precautions: Fall Restrictions Weight Bearing Restrictions: Yes LUE Weight Bearing: Non weight bearing     Mobility  Bed Mobility Overal bed mobility: Needs Assistance Bed Mobility: Supine to Sit     Supine to sit: Mod assist     General bed mobility comments: asssit to maintain LUE NWBing    Transfers Overall transfer level: Needs assistance Equipment used: 1 person hand held assist Transfers: Sit to/from Stand Sit to Stand: Min assist           General transfer comment: rises without UE support from elevated toilet height    Ambulation/Gait   Gait Distance (Feet): 30 Feet Assistive device: 1 person hand held assist Gait Pattern/deviations: Step-to pattern;Decreased stride length;Shuffle;Trunk flexed;Narrow base of support Gait velocity: decreased     General Gait Details: Pt able to ambulate in room with +2 assistance necessary for management of lines/leads, and CGA for safety in navigating to the toilet to void and back to bed.   Stairs             Wheelchair Mobility    Modified Rankin (Stroke Patients Only)       Balance Overall balance assessment: Needs assistance Sitting-balance support: Single extremity supported;Feet unsupported Sitting balance-Leahy Scale: Fair     Standing balance support: Single extremity supported;During functional activity;Reliant on assistive device for balance Standing balance-Leahy Scale: Fair Standing balance comment: impulsive  Cognition Arousal/Alertness: Awake/alert Behavior During Therapy: WFL for tasks assessed/performed Overall Cognitive Status: Within Functional Limits for tasks assessed                                           Exercises Other Exercises Other Exercises: Pt and grandson, Ysidro Evert, educated on: adapted dressing technqiues, functional application of [employer] pcns, etensive DME recs and d/c recs, home/routines mgmt    General Comments        Pertinent Vitals/Pain Pain Assessment: 0-10 Pain Score: 6  Pain Location: back and headache Pain Descriptors / Indicators: Aching Pain Intervention(s): Limited activity within patient's tolerance;RN gave pain meds during session    Home Living                          Prior Function            PT Goals (current goals can now be found in the care plan section) Acute Rehab PT Goals Patient Stated Goal: to go home. PT Goal Formulation: With patient Time For Goal Achievement: 06/06/21 Potential to Achieve Goals: Good Progress towards PT goals: Progressing toward goals    Frequency    Min 2X/week      PT Plan Current plan remains appropriate    Co-evaluation PT/OT/SLP Co-Evaluation/Treatment: Yes Reason for Co-Treatment: Complexity of the patient's impairments (multi-system involvement);For patient/therapist safety;To address functional/ADL transfers PT goals addressed during session: Mobility/safety with mobility OT goals addressed during session: ADL's and self-care      AM-PAC PT "6 Clicks" Mobility   Outcome Measure  Help needed turning from your back to your side while in a flat bed without using bedrails?: A Little Help needed moving from lying on your back to sitting on the side of a flat bed without using bedrails?: A Little Help needed moving to and from a bed to a chair (including a wheelchair)?: A Little Help needed standing up from a chair using your arms (e.g., wheelchair or bedside chair)?: A Lot Help needed to walk in hospital room?: A Lot Help needed climbing 3-5 steps with a railing? : A Lot 6 Click Score: 15    End of Session Equipment Utilized During  Treatment: Gait belt Activity Tolerance: Patient limited by pain Patient left: in bed;with family/visitor present Nurse Communication: Mobility status PT Visit Diagnosis: Unsteadiness on feet (R26.81);Other abnormalities of gait and mobility (R26.89);Repeated falls (R29.6);Muscle weakness (generalized) (M62.81);History of falling (Z91.81);Difficulty in walking, not elsewhere classified (R26.2);Pain Pain - part of body:  (Back, Mouth, and L Shoulder)     Time: 9147-8295 PT Time Calculation (min) (ACUTE ONLY): 49 min  Charges:  $Therapeutic Activity: 8-22 mins                     Gwenlyn Saran, PT, DPT 05/25/21, 1:02 PM    Christie Nottingham 05/25/2021, 12:51 PM

## 2021-05-25 NOTE — Procedures (Signed)
Pre procedural Dx: Chronic chole tube Post procedural Dx: Same  Technically successful Fluoro guided exchange of 10 Fr chole tube. Chole tube connected to gravity bag.  EBL: None Complications: None immediate  Ronny Bacon, MD Pager #: [phone]

## 2021-05-25 NOTE — Care Management Important Message (Signed)
Important Message  Patient Details  Name: Kylie May MRN: [id number] Date of Birth: Jan 25, 1922   Medicare Important Message Given:  Yes     Juliann Pulse A Tonee Silverstein 05/25/2021, 2:08 PM

## 2021-05-26 DIAGNOSIS — S01511S Laceration without foreign body of lip, sequela: Secondary | ICD-10-CM

## 2021-05-26 DIAGNOSIS — I959 Hypotension, unspecified: Principal | ICD-10-CM

## 2021-05-26 DIAGNOSIS — S42202A Unspecified fracture of upper end of left humerus, initial encounter for closed fracture: Secondary | ICD-10-CM

## 2021-05-26 DIAGNOSIS — I482 Chronic atrial fibrillation, unspecified: Secondary | ICD-10-CM

## 2021-05-26 DIAGNOSIS — R6510 Systemic inflammatory response syndrome (SIRS) of non-infectious origin without acute organ dysfunction: Secondary | ICD-10-CM

## 2021-05-26 DIAGNOSIS — S01511A Laceration without foreign body of lip, initial encounter: Secondary | ICD-10-CM

## 2021-05-26 DIAGNOSIS — E871 Hypo-osmolality and hyponatremia: Secondary | ICD-10-CM

## 2021-05-26 LAB — CBC WITH DIFFERENTIAL/PLATELET
Abs Immature Granulocytes: 0.21 10*3/uL — ABNORMAL HIGH (ref 0.00–0.07)
Basophils Absolute: 0.1 10*3/uL (ref 0.0–0.1)
Basophils Relative: 0 %
Eosinophils Absolute: 0.2 10*3/uL (ref 0.0–0.5)
Eosinophils Relative: 1 %
HCT: 25 % — ABNORMAL LOW (ref 36.0–46.0)
Hemoglobin: 8.4 g/dL — ABNORMAL LOW (ref 12.0–15.0)
Immature Granulocytes: 2 %
Lymphocytes Relative: 19 %
Lymphs Abs: 2.2 10*3/uL (ref 0.7–4.0)
MCH: 30.9 pg (ref 26.0–34.0)
MCHC: 33.6 g/dL (ref 30.0–36.0)
MCV: 91.9 fL (ref 80.0–100.0)
Monocytes Absolute: 1.1 10*3/uL — ABNORMAL HIGH (ref 0.1–1.0)
Monocytes Relative: 10 %
Neutro Abs: 7.9 10*3/uL — ABNORMAL HIGH (ref 1.7–7.7)
Neutrophils Relative %: 68 %
Platelets: 198 10*3/uL (ref 150–400)
RBC: 2.72 MIL/uL — ABNORMAL LOW (ref 3.87–5.11)
RDW: 13.5 % (ref 11.5–15.5)
WBC: 11.7 10*3/uL — ABNORMAL HIGH (ref 4.0–10.5)
nRBC: 0 % (ref 0.0–0.2)

## 2021-05-26 LAB — BASIC METABOLIC PANEL
Anion gap: 3 — ABNORMAL LOW (ref 5–15)
BUN: 25 mg/dL — ABNORMAL HIGH (ref 8–23)
CO2: 21 mmol/L — ABNORMAL LOW (ref 22–32)
Calcium: 9.2 mg/dL (ref 8.9–10.3)
Chloride: 104 mmol/L (ref 98–111)
Creatinine, Ser: 0.92 mg/dL (ref 0.44–1.00)
GFR, Estimated: 57 mL/min — ABNORMAL LOW (ref >60)
Glucose, Bld: 108 mg/dL — ABNORMAL HIGH (ref 70–99)
Potassium: 4.6 mmol/L (ref 3.5–5.1)
Sodium: 128 mmol/L — ABNORMAL LOW (ref 135–145)

## 2021-05-26 LAB — SODIUM, URINE, RANDOM: Sodium, Ur: 79 mmol/L

## 2021-05-26 MED ORDER — ACETAMINOPHEN 325 MG PO TABS: 650.0000 mg | ORAL_TABLET | Freq: Four times a day (QID) | ORAL | Status: AC | PRN

## 2021-05-26 MED ORDER — SODIUM CHLORIDE 1 G PO TABS: 1.0000 g | ORAL_TABLET | Freq: Two times a day (BID) | ORAL | 0 refills | Status: DC

## 2021-05-26 NOTE — Discharge Summary (Signed)
[facility] [place] NAME: Kylie May    MR#:  [id number]  DATE OF BIRTH:  26-Jun-1937  DATE OF ADMISSION:  05/22/2021 ADMITTING PHYSICIAN: Christel Mormon, MD  DATE OF DISCHARGE: 05/26/21  PRIMARY CARE PHYSICIAN: [name], MD    ADMISSION DIAGNOSIS:  Hypotension [I95.9] Lip laceration, initial encounter [E28.003K] Injury of head, initial encounter [S09.90XA] Fall, initial encounter [W19.XXXA] Closed fracture of proximal end of left humerus, unspecified fracture morphology, initial encounter [S42.202A] Sepsis, due to unspecified organism, unspecified whether acute organ dysfunction present ([place]) [A41.9]  DISCHARGE DIAGNOSIS:  Principal Problem:   Hypotension   SECONDARY DIAGNOSIS:   Past Medical History:  Diagnosis Date   A-fib ([facility])    Acid reflux disease    Arrhythmia    Atrial fibrillation with RVR ([place]) 08/15/2018   Chronic diastolic CHF (congestive heart failure) (HCC)    Compression fracture of lumbar vertebra ([place])    Diverticulitis 01-2008   [facility] [place]   Diverticulosis    Esophageal stricture 2006   Femoral bruit    Right   Fibrocystic breast disease    Hiatal hernia    History of esophageal stricture 06/14/2004   2006 esophageal dilation Schatzki's ring    History of skin cancer 12/29/2008   Basal cell cancer right glabella 07/10/2010 Dr. Evorn Gong, [facility] @ [place] , [place]    Hypercholesterolemia    [place] study LDL goal = < 160   Mitral regurgitation    a. 06/2014 EF 55-60%, elevated end-diastolic pressures, dilated LA at 4.3 cm, mildly dilated RA, severe mitral regurgitation, mild aortic sclerosis without stenosis, mod-severe TR   Mitral valve prolapse    Osteoporosis    Dr Matthew Saras   Pancreatitis 07-2008   Hospitalized    Patella fracture 05/13/2018   Schatzki's ring    Sepsis ([place]) 08/15/2018   Skin cancer    facial x 2. [name]    HOSPITAL COURSE:   Hyponatremia, likely SIADH.  Sodium  128 upon discharge.  Patient is mentating well and wants to go home for Christmas.  She is on salt tablets twice daily.  Recommend checking a sodium level as outpatient.  Sodium was as low as 125 during the hospital course. Accidental fall with left humerus fracture.  Arm brace.  Follow-up with orthopedics Dr. Sharlet Salina as outpatient.  As needed Tylenol for pain. Lip laceration.  Sutures will need to be removed in 7 to 10 days which would be anywhere from the 28th through the 31st.  (Looking at ER physician note 3 sutures were placed).  Will need to follow-up with PCP. SIRS on admission.  Patient was started on empiric antibiotics but this was discontinued.  Sepsis ruled out. Chronic atrial fibrillation on amiodarone Lopressor and Eliquis for anticoagulation False positive on coronavirus.  Discontinue antiviral.  Repeat COVID test negative. Chronic cholecystectomy tube.  Changed by interventional radiology on 05/25/2021. Hypotension on presentation. Acute blood loss anemia patient transfused 1 unit of packed red blood cells on a hemoglobin of 6.7.  Hemoglobin upon discharge 8.4  DISCHARGE CONDITIONS:   Satisfactory  CONSULTS OBTAINED:  Orthopedic surgery Interventional radiology  DRUG ALLERGIES:   Allergies  Allergen Reactions   Metronidazole     Other reaction(s): Dizziness and giddiness (finding), Other (qualifier value) made things feel like they were vibrating like the bed and floor,also caused mild dizziness weakness   Penicillin G Rash    DISCHARGE MEDICATIONS:   Allergies as of 05/26/2021  Reactions   Metronidazole    Other reaction(s): Dizziness and giddiness (finding), Other (qualifier value) made things feel like they were vibrating like the bed and floor,also caused mild dizziness weakness   Penicillin G Rash        Medication List     STOP taking these medications    doxycycline 100 MG tablet Commonly known as: VIBRA-TABS   molnupiravir EUA 200 mg  Caps capsule Commonly known as: LAGEVRIO       TAKE these medications    acetaminophen 325 MG tablet Commonly known as: TYLENOL Take 2 tablets (650 mg total) by mouth every 6 (six) hours as needed for mild pain (or Fever >/= 101).   amiodarone 200 MG tablet Commonly known as: PACERONE Take 1 tablet (200 mg total) by mouth daily.   apixaban 2.5 MG Tabs tablet Commonly known as: ELIQUIS Take 1 tablet (2.5 mg total) by mouth 2 (two) times daily.   benzonatate 200 MG capsule Commonly known as: TESSALON Take 1 capsule (200 mg total) by mouth 2 (two) times daily as needed for cough.   cholecalciferol 25 MCG (1000 UNIT) tablet Commonly known as: VITAMIN D3 Take 1,000 Units by mouth daily.   ferrous sulfate 325 (65 FE) MG EC tablet Take 325 mg by mouth daily.   fluticasone 50 MCG/ACT nasal spray Commonly known as: FLONASE USE 2 SPRAYS IN EACH NOSTRIL DAILY   levothyroxine 50 MCG tablet Commonly known as: SYNTHROID Take 1 tablet (50 mcg total) by mouth daily.   magnesium chloride 64 MG Tbec SR tablet Commonly known as: SLOW-MAG Take 2 tablets (128 mg total) by mouth daily.   metoprolol tartrate 25 MG tablet Commonly known as: LOPRESSOR Take 1 tablet (25 mg total) by mouth 2 (two) times daily.   Normal Saline Flush 0.9 % Soln FLUSH ONCE DAILY AS DIRECTED   omeprazole 40 MG capsule Commonly known as: PRILOSEC Take 1 capsule (40 mg total) by mouth daily.   rosuvastatin 20 MG tablet Commonly known as: CRESTOR Take 1 tablet (20 mg total) by mouth at bedtime.   sodium chloride 1 g tablet Take 1 tablet (1 g total) by mouth 2 (two) times daily with a meal.               Durable Medical Equipment  (From admission, onward)           Start     Ordered   05/24/21 1332  For home use only DME Bedside commode  Once       Question:  Patient needs a bedside commode to treat with the following condition  Answer:  Weakness   05/24/21 1332   05/24/21 1332  For home  use only DME Walker rolling  Once       Question Answer Comment  Walker: With [place]   Patient needs a walker to treat with the following condition Weakness      05/24/21 1332   05/24/21 1332  For home use only DME Hospital bed  Once       Question Answer Comment  Length of Need Lifetime   Patient has (list medical condition): multilevel osteophytic disc disease C4-C7   The above medical condition requires: Patient requires the ability to reposition frequently   Head must be elevated greater than: 30 degrees   Bed type Semi-electric   Hoyer Lift Yes   Support Surface: Gel Overlay      05/24/21 1332  DISCHARGE INSTRUCTIONS:   Follow-up PCP for lip suture removal  If you experience worsening of your admission symptoms, develop shortness of breath, life threatening emergency, suicidal or homicidal thoughts you must seek medical attention immediately by calling 911 or calling your MD immediately  if symptoms less severe.  You Must read complete instructions/literature along with all the possible adverse reactions/side effects for all the Medicines you take and that have been prescribed to you. Take any new Medicines after you have completely understood and accept all the possible adverse reactions/side effects.   Please note  You were cared for by a hospitalist during your hospital stay. If you have any questions about your discharge medications or the care you received while you were in the hospital after you are discharged, you can call the unit and asked to speak with the hospitalist on call if the hospitalist that took care of you is not available. Once you are discharged, your primary care physician will handle any further medical issues. Please note that NO REFILLS for any discharge medications will be authorized once you are discharged, as it is imperative that you return to your primary care physician (or establish a relationship with a primary care physician  if you do not have one) for your aftercare needs so that they can reassess your need for medications and monitor your lab values.    Today   CHIEF COMPLAINT:   Chief Complaint  Patient presents with   Laceration    HISTORY OF PRESENT ILLNESS:  Gabriela Irigoyen  is a 87 y.o. female admitted with a laceration and hypotension   VITAL SIGNS:  Blood pressure (!) 119/56, pulse 65, temperature 98.7 F (37.1 C), temperature source Axillary, resp. rate 16, height 5\' 4"  (1.626 m), weight 50.8 kg, SpO2 94 %.  I/O:   Intake/Output Summary (Last 24 hours) at 05/26/2021 1524 Last data filed at 05/26/2021 1037 Gross per 24 hour  Intake 300 ml  Output 480 ml  Net -180 ml    PHYSICAL EXAMINATION:  GENERAL:  87 y.o.-year-old patient lying in the bed with no acute distress.  EYES: Pupils equal, round, reactive to light and accommodation. No scleral icterus.  HEENT: Head atraumatic, normocephalic. Oropharynx and nasopharynx clear.  LUNGS: Normal breath sounds bilaterally, no wheezing, rales,rhonchi or crepitation. No use of accessory muscles of respiration.  CARDIOVASCULAR: S1, S2 normal. No murmurs, rubs, or gallops.  ABDOMEN: Soft, non-tender, non-distended. EXTREMITIES: No pedal edema.  NEUROLOGIC: Cranial nerves II through XII are intact. Muscle strength 5/5 in all extremities. Sensation intact. Gait not checked.  PSYCHIATRIC: The patient is alert and oriented x 3.  SKIN: Bruising chin and lower lip.  Scab on lower lip.  DATA REVIEW:   CBC Recent Labs  Lab 05/26/21 0306  WBC 11.7*  HGB 8.4*  HCT 25.0*  PLT 198    Chemistries  Recent Labs  Lab 05/24/21 2304 05/25/21 0446 05/25/21 0813 05/26/21 0306  NA 126*  --  127* 128*  K 4.6  --  4.4 4.6  CL 101  --  103 104  CO2 22  --  21* 21*  GLUCOSE 118*  --  121* 108*  BUN 25*  --  25* 25*  CREATININE 1.12*   < > 1.10* 0.92  CALCIUM 8.9  --  8.7* 9.2  MG 1.9  --   --   --   AST  --   --  97*  --   ALT  --   --  88*  --    ALKPHOS  --   --  48  --   BILITOT  --   --  0.9  --    < > = values in this interval not displayed.     Microbiology Results  Results for orders placed or performed during the hospital encounter of 05/22/21  Resp Panel by RT-PCR (Flu A&B, Covid) Nasopharyngeal Swab     Status: None   Collection Time: 05/22/21 11:48 PM   Specimen: Nasopharyngeal Swab; Nasopharyngeal(NP) swabs in vial transport medium  Result Value Ref Range Status   SARS Coronavirus 2 by RT PCR NEGATIVE NEGATIVE Final    Comment: (NOTE) SARS-CoV-2 target nucleic acids are NOT DETECTED.  The SARS-CoV-2 RNA is generally detectable in upper respiratory specimens during the acute phase of infection. The lowest concentration of SARS-CoV-2 viral copies this assay can detect is 138 copies/mL. A negative result does not preclude SARS-Cov-2 infection and should not be used as the sole basis for treatment or other patient management decisions. A negative result may occur with  improper specimen collection/handling, submission of specimen other than nasopharyngeal swab, presence of viral mutation(s) within the areas targeted by this assay, and inadequate number of viral copies(<138 copies/mL). A negative result must be combined with clinical observations, patient history, and epidemiological information. The expected result is Negative.  Fact Sheet for Patients:  EntrepreneurPulse.com.au  Fact Sheet for Healthcare Providers:  IncredibleEmployment.be  This test is no t yet approved or cleared by the [place] FDA and  has been authorized for detection and/or diagnosis of SARS-CoV-2 by FDA under an Emergency Use Authorization (EUA). This EUA will remain  in effect (meaning this test can be used) for the duration of the COVID-19 declaration under Section 564(b)(1) of the Act, 21 U.S.C.section 360bbb-3(b)(1), unless the authorization is terminated  or revoked sooner.        Influenza A by PCR NEGATIVE NEGATIVE Final   Influenza B by PCR NEGATIVE NEGATIVE Final    Comment: (NOTE) The Xpert Xpress SARS-CoV-2/FLU/RSV plus assay is intended as an aid in the diagnosis of influenza from Nasopharyngeal swab specimens and should not be used as a sole basis for treatment. Nasal washings and aspirates are unacceptable for Xpert Xpress SARS-CoV-2/FLU/RSV testing.  Fact Sheet for Patients: EntrepreneurPulse.com.au  Fact Sheet for Healthcare Providers: IncredibleEmployment.be  This test is not yet approved or cleared by the [place] FDA and has been authorized for detection and/or diagnosis of SARS-CoV-2 by FDA under an Emergency Use Authorization (EUA). This EUA will remain in effect (meaning this test can be used) for the duration of the COVID-19 declaration under Section 564(b)(1) of the Act, 21 U.S.C. section 360bbb-3(b)(1), unless the authorization is terminated or revoked.  Performed at [facility], [address] [place], [place] [address]   Culture, blood (Routine X 2) w Reflex to ID Panel     Status: None (Preliminary result)   Collection Time: 05/23/21  1:45 AM   Specimen: BLOOD  Result Value Ref Range Status   Specimen Description BLOOD RIGHT ARM  Final   Special Requests   Final    BOTTLES DRAWN AEROBIC AND ANAEROBIC Blood Culture results may not be optimal due to an inadequate volume of blood received in culture bottles   Culture   Final    NO GROWTH 3 DAYS Performed at [facility], [address] [place], [place] [address]    Report Status PENDING  Incomplete  Culture, blood (Routine X  2) w Reflex to ID Panel     Status: None (Preliminary result)   Collection Time: 05/23/21  7:21 AM   Specimen: BLOOD  Result Value Ref Range Status   Specimen Description BLOOD LEFT FA  Final   Special Requests   Final    BOTTLES DRAWN AEROBIC AND ANAEROBIC Blood Culture adequate volume   Culture    Final    NO GROWTH 3 DAYS Performed at [facility], [address], [place], [place] [address]    Report Status PENDING  Incomplete  Urine Culture     Status: None   Collection Time: 05/23/21  1:34 PM   Specimen: In/Out Cath Urine  Result Value Ref Range Status   Specimen Description   Final    IN/OUT CATH URINE Performed at [facility], [address], [place], [place] [address]    Special Requests   Final    NONE Performed at [facility], [address], [place], [place] [address]    Culture   Final    NO GROWTH Performed at [facility], [place] [address] [place],  [address]    Report Status 05/25/2021 FINAL  Final    RADIOLOGY:  IR EXCHANGE BILIARY DRAIN  Result Date: 05/25/2021 INDICATION: Routine cholecystostomy tube exchange. EXAM: FLUOROSCOPIC GUIDED CHOLECYSTOSTOMY TUBE EXCHANGE COMPARISON:  Multiple previous fluoroscopic guided cholecystostomy tube exchanges, most recently on 03/05/2021 MEDICATIONS: None ANESTHESIA/SEDATION: None CONTRAST:  6 mL OMNIPAQUE IOHEXOL 350 MG/ML SOLN - administered into the gallbladder lumen. FLUOROSCOPY TIME:  30 seconds (2.2 mGy) COMPLICATIONS: None immediate. PROCEDURE: The patient was positioned supine on the fluoroscopy table. The external portion of the existing cholecystostomy tube as well as the surrounding skin was prepped and draped in usual sterile fashion. A time-out was performed prior to the initiation of the procedure. A preprocedural spot fluoroscopic image was obtained of the right upper abdominal quadrant existing cholecystostomy tube. The skin surrounding the cholecystostomy tube was anesthetized with 1% lidocaine with epinephrine. The external portion of the cholecystostomy tube was cut and cannulated with a short Amplatz wire which was advanced through the tube and coiled within the gallbladder lumen Next, under intermittent fluoroscopic guidance, the existing 10 French cholecystostomy  tube was exchanged for a new 10 [place] cholecystostomy tube which was repositioned into the more central aspect of the gallbladder lumen. Contrast injection confirms appropriate positioning and functionality of the cholecystomy tube. The cholecystostomy tube was flushed with a small amount of saline and reconnected to a gravity bag. The cholecystostomy tube was secured with an interrupted suture and a Stat Lock device. A dressing was applied. The patient tolerated the procedure well without immediate postprocedural complication. FINDINGS: Preprocedural spot fluoroscopic image demonstrates unchanged positioning of cholecystostomy tube with end coiled and locked over the expected location of the fundus of the gallbladder After fluoroscopic guided exchange, the new 10 French cholecystostomy tube is more ideally positioned with end coiled and locked within the central aspect of the gallbladder lumen. Post exchange cholangiogram demonstrates appropriate positioning and functionality of the new cholecystostomy tube. IMPRESSION: Successful fluoroscopic guided exchange, repositioning and up sizing of now 29 French cholecystostomy tube. PLAN: - The patient's cholecystostomy tube was reconnected to a gravity bag. - Routine cholecystostomy tube exchange in 6-8 weeks. Electronically Signed   By: Sandi Mariscal M.D.   On: 05/25/2021 16:19      Management plans discussed with the patient, family and they are in agreement.  CODE STATUS:  Code Status Orders  (From admission, onward)           Start     Ordered   05/23/21 0444  Full code  Continuous        05/23/21 0446           Code Status History     Date Active Date Inactive Code Status Order ID Comments User Context   08/18/2018 2151 08/26/2018 1544 Full Code [id number]  Schorr, Rhetta Mura, NP Inpatient   08/16/2018 1622 08/18/2018 2151 Partial Code [id number]  Rush Farmer, MD Inpatient   08/15/2018 1953 08/16/2018 1622 Full Code [id number]  Allie Bossier, MD Inpatient   08/15/2018 0352 08/15/2018 1924 Full Code [id number]  Harrie Foreman, MD Inpatient   07/05/2018 2310 07/07/2018 1500 Full Code [id number]  Arta Silence, MD Inpatient       TOTAL TIME TAKING CARE OF THIS PATIENT: 35 minutes.    Loletha Grayer M.D on 05/26/2021 at 3:24 PM   [facility]  CC: Primary care physician; [name], MD

## 2021-05-26 NOTE — TOC Progression Note (Signed)
Transition of Care [facility]) - Progression Note    Patient Details  Name: Kylie May MRN: [id number] Date of Birth: Mar 31, 1922  Transition of Care [facility]) CM/SW [place], LCSW Phone Number: 05/26/2021, 12:39 PM  Clinical Narrative:    Patient has a DC order. Called Jasmine with [employer] to inquire about delivery of DME. Jasmine stated she has to check if it can be delivered to patient's home, will let CSW know.  Updated MD.    Expected Discharge Plan: [place] Barriers to Discharge: Continued Medical Work up  Expected Discharge Plan and Services Expected Discharge Plan: [place]   Discharge Planning Services: CM Consult Post Acute Care Choice: [place] arrangements for the past 2 months: Single Family Home Expected Discharge Date: 05/26/21               DME Arranged: 3-N-1, Hospital bed, Walker rolling DME Agency: [employer] Date DME Agency Contacted: 05/24/21   Representative spoke with at DME Agency: Luis Llorens Torres: RN, PT, OT, Nurse's Aide [place] Agency: [facility] Date [facility] Agency Contacted: 05/24/21   Representative spoke with at [place]: [place] ([place]) Interventions    Readmission Risk Interventions No flowsheet data found.

## 2021-05-26 NOTE — Plan of Care (Addendum)
°  Problem: Clinical Measurements: Goal: Ability to maintain clinical measurements within normal limits will improve Outcome: Progressing Goal: Will remain free from infection Outcome: Progressing Goal: Diagnostic test results will improve Outcome: Progressing Goal: Respiratory complications will improve Outcome: Progressing Goal: Cardiovascular complication will be avoided Outcome: Progressing   Problem: Pain Managment: Goal: General experience of comfort will improve Outcome: Progressing   Pt is alert but confused. V/S stable. Has some pain on her L arm when being repositioned. Daughter at [facility]. Oxygen saturation dropped to 88-89% while sleeping, instructed to take deep breaths & elevated her head then o2 sat goes back up to 91-96%. Pt denies SOB.

## 2021-05-26 NOTE — TOC Transition Note (Addendum)
Transition of Care [facility]) - CM/SW Discharge Note   Patient Details  Name: Kylie May MRN: [id number] Date of Birth: September 21, 1921  Transition of [facility]) CM/SW Contact:  Kylie Ivan, LCSW Phone Number: 05/26/2021, 1:14 PM   Clinical Narrative:    Patient to DC home today. Per MD, patient is ok to DC home without hospital bed and it to be delivered later if family is ok with that. Spoke with grandson Kylie May who says he is not sure if they want the hospital bed or not, but is ok with patient DC home before it (as well as RW and 3 in 1) is delivered. CSW spoke with [facility] with [employer] and requested an [employer] Rep call Kylie May to answer his questions about the hospital bed and schedule a delivery for DME, she stated someone will call Kylie May about this today. All DME to be delivered to the home after DC. Kylie May stated family will transport patient home and he has lots of family to help her get in and out of the car.  They already have a w/c for patient that they will use.  Notified Kylie May with [facility] of DC via phone. Start of care for [facility] should be Tuesday and family is aware.  1:24[name] with [facility] unable to staff nursing. Called grandson Kylie May to inquire if he wants to use [facility] with no nursing or wants to use another agency. He stated he doesn't think they need a RN but does feel they need help with bathing, asking about Aide. Spoke with Kylie May with [facility] who confirmed they can do Aide services. Updated grandson and asked MD to update HH orders.    Final next level of care: [place] Barriers to Discharge: Barriers Resolved   Patient Goals and CMS Choice Patient states their goals for this hospitalization and ongoing recovery are:: home with home health CMS Medicare.gov Compare Post Acute Care list provided to:: Patient Represenative (must comment) Choice offered to / list presented to : NA  Discharge Placement                Patient to be  transferred to facility by: family Name of family member notified: Kylie May Patient and family notified of of transfer: 05/26/21  Discharge Plan and Services   Discharge Planning Services: CM Consult Post Acute Care Choice: Home Health          DME Arranged: 3-N-1, Hospital bed, Walker rolling DME Agency: [employer] Date DME Agency Contacted: 05/26/21   Representative spoke with at DME Agency: Kylie May: RN, PT, OT [facility] Agency: [facility] Date [place]: 05/26/21   Representative spoke with at [place]: Kylie May ([place]) Interventions     Readmission Risk Interventions No flowsheet data found.

## 2021-05-26 NOTE — Progress Notes (Addendum)
Discharge instructions and scripts reviewed with grandson and sent home with grandson. Follow up appointments discussed with grandson. Patient denies pain or distress at time of discharge. All patient belongings sent home with family.

## 2021-05-28 LAB — CULTURE, BLOOD (ROUTINE X 2)
Culture: NO GROWTH
Culture: NO GROWTH
Special Requests: ADEQUATE

## 2021-05-29 ENCOUNTER — Other Ambulatory Visit (HOSPITAL_COMMUNITY): Payer: TRICARE For Life (TFL)

## 2021-05-29 ENCOUNTER — Telehealth: Payer: Self-pay

## 2021-05-29 ENCOUNTER — Telehealth: Payer: Self-pay | Admitting: Family Medicine

## 2021-05-29 MED ORDER — APIXABAN 2.5 MG PO TABS
2.5000 mg | ORAL_TABLET | Freq: Two times a day (BID) | ORAL | 0 refills | Status: DC
Start: 2021-05-29 — End: 2021-06-12

## 2021-05-29 MED ORDER — ROSUVASTATIN CALCIUM 20 MG PO TABS: 20.0000 mg | ORAL_TABLET | Freq: Every day | ORAL | 0 refills | Status: DC

## 2021-05-29 MED ORDER — METOPROLOL TARTRATE 25 MG PO TABS: 25.0000 mg | ORAL_TABLET | Freq: Two times a day (BID) | ORAL | 0 refills | Status: DC

## 2021-05-29 MED ORDER — AMIODARONE HCL 200 MG PO TABS: 200.0000 mg | ORAL_TABLET | Freq: Every day | ORAL | 0 refills | Status: DC

## 2021-05-29 NOTE — Addendum Note (Signed)
Addended by: Brynda Peon on: 05/29/2021 03:12 PM   Modules accepted: Orders

## 2021-05-29 NOTE — Telephone Encounter (Signed)
Transition Care Management Follow-up Telephone Call Date of discharge and from where: 05/28/2021-[facility] How have you been since you were released from the hospital? Per grandson, she is doing ok. She is spending most of her time in the recliner & gets up & walks for very short distances with assistance from family. Awaiting call from home health for PT. Any questions or concerns? No  Items Reviewed: Did the pt receive and understand the discharge instructions provided? Yes  Medications obtained and verified? Yes  Other? Yes  Any new allergies since your discharge? No  Dietary orders reviewed? Yes Do you have support at home? Yes   Home Care and Equipment/Supplies: Were home health services ordered? yes If so, what is the name of the agency? [facility]  Has the agency set up a time to come to the patient's home? no Were any new equipment or medical supplies ordered?  Yes: Bedside commode, hospital bed & walker What is the name of the medical supply agency? Unknown-not in discharge summary & grandson does not know. Were you able to get the supplies/equipment? no Do you have any questions related to the use of the equipment or supplies? No  Functional Questionnaire: (I = Independent and D = Dependent) ADLs: D  Bathing/Dressing- D  Meal Prep- D  Eating- I  Maintaining continence- D  Transferring/Ambulation- D  Managing Meds- D  Follow up appointments reviewed:  PCP Hospital f/u appt confirmed? Yes  Scheduled to see Laverna Peace on 05/31/21 @ 11:00. [facility] f/u appt confirmed? Yes  Scheduled to see Dr. Sharlet Salina on 06/01/21 @ 11:00. Are transportation arrangements needed? No  If their condition worsens, is the pt aware to call PCP or go to the Emergency Dept.? Yes Was the patient provided with contact information for the PCP's office or ED? Yes Was to pt encouraged to call back with questions or concerns? Yes

## 2021-05-29 NOTE — Telephone Encounter (Signed)
Patient's Kylie May called, [phone]. Would like a call back from [place]. Can not make the Friday appointment, patient has another appointment with another provider.

## 2021-05-29 NOTE — Telephone Encounter (Signed)
Reviewed

## 2021-05-29 NOTE — Telephone Encounter (Signed)
Kelly from [facility] called in regards to physical therapy for pt. Kylie May is just requesting verbal ok to began PT for pt. Kylie May can be reached at [phone] and the line is secure to leave VM

## 2021-05-29 NOTE — Telephone Encounter (Signed)
Noted  

## 2021-05-29 NOTE — Telephone Encounter (Signed)
I called and LVM for the well care nurse Merleen Nicely and informed her that we do not take verbal orders she needed to fax the order to [place] and I left the fax number.  Deeric Cruise,cma

## 2021-05-29 NOTE — Telephone Encounter (Signed)
Refill request for Eliquis-please see below.  Patient overdue for 6 month F/U appointment. Last seen 04/2020. Will send further refills for following medications at upcoming appointment.   Requested Prescriptions   Signed Prescriptions Disp Refills   rosuvastatin (CRESTOR) 20 MG tablet 30 tablet 0    Sig: Take 1 tablet (20 mg total) by mouth at bedtime.    Authorizing Provider: Minna Merritts    Ordering User: Raelene Bott, Bhavika Schnider L   amiodarone (PACERONE) 200 MG tablet 30 tablet 0    Sig: Take 1 tablet (200 mg total) by mouth daily.    Authorizing Provider: Minna Merritts    Ordering User: Raelene Bott, Kimmie Doren L   metoprolol tartrate (LOPRESSOR) 25 MG tablet 60 tablet 0    Sig: Take 1 tablet (25 mg total) by mouth 2 (two) times daily.    Authorizing Provider: Minna Merritts    Ordering User: Raelene Bott, Rasheena Talmadge L

## 2021-05-29 NOTE — Telephone Encounter (Signed)
I called and spoke with grandson and patient is scheduled for Thursday for a hospital f/up.  Ismar Yabut,cma

## 2021-05-29 NOTE — Telephone Encounter (Signed)
Pt last saw Dr Rockey Situ on 04/21/20, pt is overdue for follow-up.  Pt has an upcoming appt scheduled for 06/12/21 with Dr Rockey Situ.  Last labs 05/26/21 Creat 0.92, age 87, weight 50.8kg, based on specified criteria pt is on appropriate dosage of Eliquis 2.5mg  BID for afib.  Will refill Eliquis to get pt to upcoming appt on 06/12/21 with Dr Rockey Situ.

## 2021-05-29 NOTE — Telephone Encounter (Signed)
Patient scheduled 01/10

## 2021-05-29 NOTE — Telephone Encounter (Signed)
° ° °  I called the patient to see how she was doing I scheduled her with Dr. Olivia Mackie for a hospital follow up on 06/01/2021, I left a detailed message about the appointment on the patients voicemail about the appointment.  Ajay Strubel,cma

## 2021-05-30 DIAGNOSIS — I5032 Chronic diastolic (congestive) heart failure: Secondary | ICD-10-CM | POA: Diagnosis not present

## 2021-05-30 DIAGNOSIS — M810 Age-related osteoporosis without current pathological fracture: Secondary | ICD-10-CM | POA: Diagnosis not present

## 2021-05-30 DIAGNOSIS — Z85828 Personal history of other malignant neoplasm of skin: Secondary | ICD-10-CM | POA: Diagnosis not present

## 2021-05-30 DIAGNOSIS — Z9049 Acquired absence of other specified parts of digestive tract: Secondary | ICD-10-CM | POA: Diagnosis not present

## 2021-05-30 DIAGNOSIS — Z434 Encounter for attention to other artificial openings of digestive tract: Secondary | ICD-10-CM | POA: Diagnosis not present

## 2021-05-30 DIAGNOSIS — N6019 Diffuse cystic mastopathy of unspecified breast: Secondary | ICD-10-CM | POA: Diagnosis not present

## 2021-05-30 DIAGNOSIS — Z7951 Long term (current) use of inhaled steroids: Secondary | ICD-10-CM | POA: Diagnosis not present

## 2021-05-30 DIAGNOSIS — E7800 Pure hypercholesterolemia, unspecified: Secondary | ICD-10-CM | POA: Diagnosis not present

## 2021-05-30 DIAGNOSIS — Z8781 Personal history of (healed) traumatic fracture: Secondary | ICD-10-CM | POA: Diagnosis not present

## 2021-05-30 DIAGNOSIS — Z9181 History of falling: Secondary | ICD-10-CM | POA: Diagnosis not present

## 2021-05-30 DIAGNOSIS — S42202D Unspecified fracture of upper end of left humerus, subsequent encounter for fracture with routine healing: Secondary | ICD-10-CM | POA: Diagnosis not present

## 2021-05-30 DIAGNOSIS — D62 Acute posthemorrhagic anemia: Secondary | ICD-10-CM | POA: Diagnosis not present

## 2021-05-30 DIAGNOSIS — Z7901 Long term (current) use of anticoagulants: Secondary | ICD-10-CM | POA: Diagnosis not present

## 2021-05-30 DIAGNOSIS — K219 Gastro-esophageal reflux disease without esophagitis: Secondary | ICD-10-CM | POA: Diagnosis not present

## 2021-05-30 DIAGNOSIS — E871 Hypo-osmolality and hyponatremia: Secondary | ICD-10-CM | POA: Diagnosis not present

## 2021-05-30 DIAGNOSIS — S01511D Laceration without foreign body of lip, subsequent encounter: Secondary | ICD-10-CM | POA: Diagnosis not present

## 2021-05-30 DIAGNOSIS — S0990XD Unspecified injury of head, subsequent encounter: Secondary | ICD-10-CM | POA: Diagnosis not present

## 2021-05-30 DIAGNOSIS — K5790 Diverticulosis of intestine, part unspecified, without perforation or abscess without bleeding: Secondary | ICD-10-CM | POA: Diagnosis not present

## 2021-05-30 DIAGNOSIS — I081 Rheumatic disorders of both mitral and tricuspid valves: Secondary | ICD-10-CM | POA: Diagnosis not present

## 2021-05-30 DIAGNOSIS — K449 Diaphragmatic hernia without obstruction or gangrene: Secondary | ICD-10-CM | POA: Diagnosis not present

## 2021-05-30 DIAGNOSIS — I482 Chronic atrial fibrillation, unspecified: Secondary | ICD-10-CM | POA: Diagnosis not present

## 2021-05-31 ENCOUNTER — Other Ambulatory Visit: Payer: Self-pay | Admitting: Cardiovascular Disease

## 2021-05-31 ENCOUNTER — Ambulatory Visit: Payer: TRICARE For Life (TFL) | Admitting: Adult Health

## 2021-05-31 ENCOUNTER — Ambulatory Visit (INDEPENDENT_AMBULATORY_CARE_PROVIDER_SITE_OTHER): Payer: Medicare Other | Admitting: Adult Health

## 2021-05-31 DIAGNOSIS — U071 COVID-19: Secondary | ICD-10-CM

## 2021-05-31 DIAGNOSIS — E038 Other specified hypothyroidism: Secondary | ICD-10-CM

## 2021-05-31 DIAGNOSIS — E871 Hypo-osmolality and hyponatremia: Secondary | ICD-10-CM | POA: Diagnosis not present

## 2021-05-31 DIAGNOSIS — I5032 Chronic diastolic (congestive) heart failure: Secondary | ICD-10-CM | POA: Diagnosis not present

## 2021-05-31 DIAGNOSIS — Z9289 Personal history of other medical treatment: Secondary | ICD-10-CM

## 2021-05-31 DIAGNOSIS — Z9181 History of falling: Secondary | ICD-10-CM | POA: Insufficient documentation

## 2021-05-31 DIAGNOSIS — R296 Repeated falls: Secondary | ICD-10-CM

## 2021-05-31 DIAGNOSIS — I480 Paroxysmal atrial fibrillation: Secondary | ICD-10-CM | POA: Diagnosis not present

## 2021-05-31 DIAGNOSIS — S42295D Other nondisplaced fracture of upper end of left humerus, subsequent encounter for fracture with routine healing: Secondary | ICD-10-CM

## 2021-05-31 DIAGNOSIS — R0600 Dyspnea, unspecified: Secondary | ICD-10-CM

## 2021-05-31 DIAGNOSIS — R051 Acute cough: Secondary | ICD-10-CM | POA: Diagnosis not present

## 2021-05-31 DIAGNOSIS — R7989 Other specified abnormal findings of blood chemistry: Secondary | ICD-10-CM

## 2021-05-31 DIAGNOSIS — Z4889 Encounter for other specified surgical aftercare: Secondary | ICD-10-CM | POA: Diagnosis not present

## 2021-05-31 DIAGNOSIS — R6510 Systemic inflammatory response syndrome (SIRS) of non-infectious origin without acute organ dysfunction: Secondary | ICD-10-CM | POA: Diagnosis not present

## 2021-05-31 NOTE — Patient Instructions (Signed)
Given hyponatremia, and now covid test point of care positive at home and recent blood transfusion, viist is not appropriate for telephone. I advise you to take patient back to the emergency room for hands on evaluation and suture removal. Not comfortable repeating antiviral without positive PCR and  needs medical evaluation in person today 05/31/21.  Follow treatment plan from office  if not improving or any worsening within 72 hours and also return to office or open medical facility at ANYTIME if any symptoms persist, change, or worsen or you have any further concerns or questions. Call 911 immediately for emergencies.   10 Things You Can Do to Manage Your COVID-19 Symptoms at Home If you have possible or confirmed COVID-19 Stay home except to get medical care. Monitor your symptoms carefully. If your symptoms get worse, call your healthcare provider immediately. Get rest and stay hydrated. If you have a medical appointment, call the healthcare provider ahead of time and tell them that you have or may have COVID-19. For medical emergencies, call 911 and notify the dispatch personnel that you have or may have COVID-19. Cover your cough and sneezes with a tissue or use the inside of your elbow. Wash your hands often with soap and water for at least 20 seconds or clean your hands with an alcohol-based hand sanitizer that contains at least 60% alcohol. As much as possible, stay in a specific room and away from other people in your home. Also, you should use a separate bathroom, if available. If you need to be around other people in or outside of the home, wear a mask. Avoid sharing personal items with other people in your household, like dishes, towels, and bedding. Clean all surfaces that are touched often, like counters, tabletops, and doorknobs. Use household cleaning sprays or wipes according to the label instructions. [email or web address] 12/17/2019 This information is not intended to replace  advice given to you by your health care provider. Make sure you discuss any questions you have with your health care provider. Document Revised: 02/09/2021 Document Reviewed: 02/09/2021 Elsevier Patient Education  2022 [place].   Upper Respiratory Infection, Adult An upper respiratory infection (URI) affects the nose, throat, and upper airways that lead to the lungs. The most common type of URI is often called the common cold. URIs usually get better on their own, without medical treatment. What are the causes? A URI is caused by a germ (virus). You may catch these germs by: Breathing in droplets from an infected person's cough or sneeze. Touching something that has the germ on it (is contaminated) and then touching your mouth, nose, or eyes. What increases the risk? You are more likely to get a URI if: You are very young or very old. You have close contact with others, such as at work, school, or a health care facility. You smoke. You have long-term (chronic) heart or lung disease. You have a weakened disease-fighting system (immune system). You have nasal allergies or asthma. You have a lot of stress. You have poor nutrition. What are the signs or symptoms? Runny or stuffy (congested) nose. Cough. Sneezing. Sore throat. Headache. Feeling tired (fatigue). Fever. Not wanting to eat as much as usual. Pain in your forehead, behind your eyes, and over your cheekbones (sinus pain). Muscle aches. Redness or irritation of the eyes. Pressure in the ears or face. How is this treated? URIs usually get better on their own within 7-10 days. Medicines cannot cure URIs, but your doctor may recommend certain medicines  to help relieve symptoms, such as: Over-the-counter cold medicines. Medicines to reduce coughing (cough suppressants). Coughing is a type of defense against infection that helps to clear the nose, throat, windpipe, and lungs (respiratory system). Take these medicines only  as told by your doctor. Medicines to lower your fever. Follow these instructions at home: Activity Rest as needed. If you have a fever, stay home from work or school until your fever is gone, or until your doctor says you may return to work or school. You should stay home until you cannot spread the infection anymore (you are not contagious). Your doctor may have you wear a face mask so you have less risk of spreading the infection. Relieving symptoms Rinse your mouth often with salt water. To make salt water, dissolve -1 tsp (3-6 g) of salt in 1 cup (237 mL) of warm water. Use a cool-mist humidifier to add moisture to the air. This can help you breathe more easily. Eating and drinking  Drink enough fluid to keep your pee (urine) pale yellow. Eat soups and other clear broths. General instructions  Take over-the-counter and prescription medicines only as told by your doctor. Do not smoke or use any products that contain nicotine or tobacco. If you need help quitting, ask your doctor. Avoid being where people are smoking (avoid secondhand smoke). Stay up to date on all your shots (immunizations), and get the flu shot every year. Keep all follow-up visits. How to prevent the spread of infection to others  Wash your hands with soap and water for at least 20 seconds. If you cannot use soap and water, use hand sanitizer. Avoid touching your mouth, face, eyes, or nose. Cough or sneeze into a tissue or your sleeve or elbow. Do not cough or sneeze into your hand or into the air. Contact a doctor if: You are getting worse, not better. You have any of these: A fever or chills. Brown or red mucus in your nose. Yellow or brown fluid (discharge)coming from your nose. Pain in your face, especially when you bend forward. Swollen neck glands. Pain when you swallow. White areas in the back of your throat. Get help right away if: You have shortness of breath that gets worse. You have very bad or  constant: Headache. Ear pain. Pain in your forehead, behind your eyes, and over your cheekbones (sinus pain). Chest pain. You have long-lasting (chronic) lung disease along with any of these: Making high-pitched whistling sounds when you breathe, most often when you breathe out (wheezing). Long-lasting cough (more than 14 days). Coughing up blood. A change in your usual mucus. You have a stiff neck. You have changes in your: Vision. Hearing. Thinking. Mood. These symptoms may be an emergency. Get help right away. Call 911. Do not wait to see if the symptoms will go away. Do not drive yourself to the hospital. Summary An upper respiratory infection (URI) is caused by a germ (virus). The most common type of URI is often called the common cold. URIs usually get better within 7-10 days. Take over-the-counter and prescription medicines only as told by your doctor. This information is not intended to replace advice given to you by your health care provider. Make sure you discuss any questions you have with your health care provider. Document Revised: 12/20/2020 Document Reviewed: 12/20/2020 Elsevier Patient Education  [place].

## 2021-05-31 NOTE — Progress Notes (Signed)
Virtual Visit via Telephone Note  I connected with CHANDY TARMAN on 05/31/21 at 11:00 AM EST by telephone and verified that I am speaking with the correct person using two identifiers.  Location: Patient: at home  Provider: Provider: Provider's office at  [facility], [place].   I discussed the limitations, risks, security and privacy concerns of performing an evaluation and management service by telephone and the availability of in person appointments. I also discussed with the patient that there may be a patient responsible charge related to this service. The patient expressed understanding and agreed to proceed.   History of Present Illness:  No vitals available.  Wyatt grandson on phone Rachel. PCR test returned negative. Questionable false positive. He reports the entire family now has covid. She has tested positive today.Grandson reports she fell due to pants down to knees. Denies any dizziness.   05/17/21 seen for cough. Recheck in one week had with Dr. Caryl Bis. BNP was appropriate for age. Symptom started on 05/14/21 point of care test positive in office 05/17/21. Took Molnupiravir started 05/17/21  she completed. He reports he she felt better in  after Discharged with sodium of 125, low. Given sodium tablets at the hospital.   She had to have stitches to her lip.  Had fall closed fracture left humerus and closed fracture left finger hand.  Admitted for SIRS,   Needs sutures removed 12/31.follow up with [place]. 06/04/2021    Observations/Objective:  Patient not on phone.  Patients grandson is on phone.    Assessment and Plan:  The primary encounter diagnosis was Positive self-administered antigen test for COVID-19. Diagnoses of Hyponatremia, History of recent fall, Other closed nondisplaced fracture of proximal end of left humerus with routine healing, subsequent encounter, Paroxysmal atrial fibrillation (HCC),  and Chronic diastolic CHF (congestive heart failure) ([place]) were also pertinent to this visit.   Not comfortable prescribing antiviral again given patients recent labs and recent fall and side effects of worsening dizziness.  She was POCT positve for covid on 05/17/21 and treated with Molnupiravir. Recommend in person evaluation for patient due to complex medical history. Strongly advise ER now.needs suture removal.   Follow Up Instructions:    I discussed the assessment and treatment plan with the patient. The patient was provided an opportunity to ask questions and all were answered. The patient agreed with the plan and demonstrated an understanding of the instructions.   The patient was advised to call back or seek an in-person evaluation if the symptoms worsen or if the condition fails to improve as anticipated. Patient grandson verbalized understanding of all instructions given and denies any further questions at this time.    I provided 30 minutes of non-face-to-face time during this encounter.   Marcille Buffy, FNP

## 2021-06-01 ENCOUNTER — Inpatient Hospital Stay: Payer: TRICARE For Life (TFL) | Admitting: Internal Medicine

## 2021-06-01 ENCOUNTER — Telehealth: Payer: Self-pay | Admitting: Family Medicine

## 2021-06-01 ENCOUNTER — Ambulatory Visit: Payer: Medicare Other | Admitting: Family

## 2021-06-01 DIAGNOSIS — U071 COVID-19: Secondary | ICD-10-CM | POA: Diagnosis not present

## 2021-06-01 NOTE — Telephone Encounter (Signed)
I called spoke with Stepahnie of [facility] and informed her that it was okay to move the appointment.  Nia,cma

## 2021-06-01 NOTE — Telephone Encounter (Signed)
Colletta Maryland from [facility] called in stating that the pt canceled her OT evaluation appt because her family was sick and pt want to move it to next week. Colletta Maryland stated that she need permission to move the appt.

## 2021-06-03 ENCOUNTER — Encounter: Payer: Self-pay | Admitting: Adult Health

## 2021-06-05 DIAGNOSIS — S01511D Laceration without foreign body of lip, subsequent encounter: Secondary | ICD-10-CM | POA: Diagnosis not present

## 2021-06-05 DIAGNOSIS — I482 Chronic atrial fibrillation, unspecified: Secondary | ICD-10-CM | POA: Diagnosis not present

## 2021-06-05 DIAGNOSIS — S42202D Unspecified fracture of upper end of left humerus, subsequent encounter for fracture with routine healing: Secondary | ICD-10-CM | POA: Diagnosis not present

## 2021-06-05 DIAGNOSIS — D62 Acute posthemorrhagic anemia: Secondary | ICD-10-CM | POA: Diagnosis not present

## 2021-06-05 DIAGNOSIS — I5032 Chronic diastolic (congestive) heart failure: Secondary | ICD-10-CM | POA: Diagnosis not present

## 2021-06-05 DIAGNOSIS — S0990XD Unspecified injury of head, subsequent encounter: Secondary | ICD-10-CM | POA: Diagnosis not present

## 2021-06-06 ENCOUNTER — Telehealth: Payer: Self-pay | Admitting: Family Medicine

## 2021-06-06 DIAGNOSIS — I482 Chronic atrial fibrillation, unspecified: Secondary | ICD-10-CM | POA: Diagnosis not present

## 2021-06-06 DIAGNOSIS — S01511D Laceration without foreign body of lip, subsequent encounter: Secondary | ICD-10-CM | POA: Diagnosis not present

## 2021-06-06 DIAGNOSIS — S0990XD Unspecified injury of head, subsequent encounter: Secondary | ICD-10-CM | POA: Diagnosis not present

## 2021-06-06 DIAGNOSIS — S42202D Unspecified fracture of upper end of left humerus, subsequent encounter for fracture with routine healing: Secondary | ICD-10-CM | POA: Diagnosis not present

## 2021-06-06 DIAGNOSIS — I5032 Chronic diastolic (congestive) heart failure: Secondary | ICD-10-CM | POA: Diagnosis not present

## 2021-06-06 DIAGNOSIS — D62 Acute posthemorrhagic anemia: Secondary | ICD-10-CM | POA: Diagnosis not present

## 2021-06-06 NOTE — Telephone Encounter (Signed)
[facility] stated Patient verbal order approval occupational therapy for once a week for 6 weeks.

## 2021-06-07 NOTE — Telephone Encounter (Signed)
I called stephanie and LVM informing her that we do not do verbal orders and she is to fax the orders to me to ne signed and sent back.  Adarius Tigges,cma

## 2021-06-08 DIAGNOSIS — S42202A Unspecified fracture of upper end of left humerus, initial encounter for closed fracture: Secondary | ICD-10-CM | POA: Diagnosis not present

## 2021-06-08 DIAGNOSIS — S01511D Laceration without foreign body of lip, subsequent encounter: Secondary | ICD-10-CM | POA: Diagnosis not present

## 2021-06-08 DIAGNOSIS — D62 Acute posthemorrhagic anemia: Secondary | ICD-10-CM | POA: Diagnosis not present

## 2021-06-08 DIAGNOSIS — S42202D Unspecified fracture of upper end of left humerus, subsequent encounter for fracture with routine healing: Secondary | ICD-10-CM | POA: Diagnosis not present

## 2021-06-08 DIAGNOSIS — I5032 Chronic diastolic (congestive) heart failure: Secondary | ICD-10-CM | POA: Diagnosis not present

## 2021-06-08 DIAGNOSIS — S0990XD Unspecified injury of head, subsequent encounter: Secondary | ICD-10-CM | POA: Diagnosis not present

## 2021-06-08 DIAGNOSIS — I482 Chronic atrial fibrillation, unspecified: Secondary | ICD-10-CM | POA: Diagnosis not present

## 2021-06-08 DIAGNOSIS — S62641A Nondisplaced fracture of proximal phalanx of left index finger, initial encounter for closed fracture: Secondary | ICD-10-CM | POA: Diagnosis not present

## 2021-06-08 DIAGNOSIS — M79632 Pain in left forearm: Secondary | ICD-10-CM | POA: Diagnosis not present

## 2021-06-11 DIAGNOSIS — S0990XD Unspecified injury of head, subsequent encounter: Secondary | ICD-10-CM | POA: Diagnosis not present

## 2021-06-11 DIAGNOSIS — I482 Chronic atrial fibrillation, unspecified: Secondary | ICD-10-CM | POA: Diagnosis not present

## 2021-06-11 DIAGNOSIS — I5032 Chronic diastolic (congestive) heart failure: Secondary | ICD-10-CM | POA: Diagnosis not present

## 2021-06-11 DIAGNOSIS — S42202D Unspecified fracture of upper end of left humerus, subsequent encounter for fracture with routine healing: Secondary | ICD-10-CM | POA: Diagnosis not present

## 2021-06-11 DIAGNOSIS — S01511D Laceration without foreign body of lip, subsequent encounter: Secondary | ICD-10-CM | POA: Diagnosis not present

## 2021-06-11 DIAGNOSIS — D62 Acute posthemorrhagic anemia: Secondary | ICD-10-CM | POA: Diagnosis not present

## 2021-06-11 NOTE — Progress Notes (Signed)
**Note De-Identified Kylie Obfuscation** Date:  06/12/2021   ID:  Kylie May, DOB 22-Feb-1922, MRN [id number]  Patient Location:  [place] [address]   Provider location:   [facility], [place] office  PCP:  [name], MD  Cardiologist:  [name] [facility]   Chief Complaint  Patient presents with   6 month follow up     Patient took a fall on 05/22/2021 and was diagnosed with COVID 05/31/21. Patient having LE edema. Medications reviewed by the patient verbally.     History of Present Illness:    Kylie May is a 88 y.o. female  past medical history of paroxysmal asymptomatic  a-fib,  HTN,  hiatal hernia,  osteopenia,  degenerative joint disease,  Scoliosis Long history of falls, 2018 prior fall with pelvic fracture  admitted to [facility] 1/7-1/10/16 for new onset a-fib  And mild diastolic CHF, who presents today for follow-up of her atrial fibrillation, aortic atherosclerosis  LOV 10/2019 Hospitalized December 2022 Hypotension [I95.9] Lip laceration, initial encounter [S01.511A] Injury of head, initial encounter [S09.90XA] Fall, initial encounter [W19.XXXA] Closed fracture of proximal end of left humerus, unspecified fracture morphology, initial encounter [S42.202A] Sepsis, due to unspecified organism, unspecified whether acute organ dysfunction present ([place]  Diagnosed with hyponatremia, SIADH, sodium 128 on discharge Was started on salt tabs twice a day Sodium as low as 125 during hospital course Anemia: HGB 6.7 up To 8.4 on discharge  Albumin 2.4 HGB 8.4  very hard of hearing,  Family with her today  Drain is still in place, family prefers to keep it in place for now "tube changed"  Weight 98 pounds  EKG personally reviewed by myself on todays visit Shows sinus bradycardia rate 57 bpm poor R wave progression to the anterior leads, unable to exclude old inferior MI  Other past medical history reviewed  from 2020, severe epigastric pain  from 08/15/2018-08/26/2018  CT of her abdomen and pelvis which showed intra-and extrahepatic ductal dilatation and gallbladder wall thickening but no stones    transferred to [facility] for ERCP.  She underwent cholecystostomy tube placement by interventional radiology and subsequently underwent ERCP which showed dilatation of the bile duct and cholelithiasis.  She had the stone removed and had a biliary sphincterectomy performed and a plastic stent was placed in the pancreatic duct   A. fib with RVR complicated by decompensated heart failure with fluid retention.  She was on anticoagulation with apixaban amiodarone metoprolol placed on oral Lasix  Suffered severe calorie protein malnutrition Lower extremity edema, anemia On last clinic visit recommended to decrease amiodarone dosing, continue metoprolol, on anticoagulation, continue Lasix as needed for leg swelling -She had a cholecystostomy bag.,  Biliary drain was in place Scheduled to see IR on last visit   Past Medical History:  Diagnosis Date   A-fib ([place])    Acid reflux disease    Arrhythmia    Atrial fibrillation with RVR ([place]) 08/15/2018   Chronic diastolic CHF (congestive heart failure) ([place])    Compression fracture of lumbar vertebra ([place])    Diverticulitis 01-2008   GI [place]   Diverticulosis    Esophageal stricture 2006   Femoral bruit    Right   Fibrocystic breast disease    Hiatal hernia    History of esophageal stricture 06/14/2004   2006 esophageal dilation Schatzki's ring    History of skin cancer 12/29/2008   Basal cell cancer right glabella 07/10/2010 Dr. Evorn Gong,  [facility] @ [place] , [place]    Hypercholesterolemia    [place] study LDL goal = < 160   Mitral regurgitation    a. 06/2014 EF 55-60%, elevated end-diastolic pressures, dilated LA at 4.3 cm, mildly dilated RA, severe mitral regurgitation, mild aortic sclerosis without stenosis, mod-severe TR   Mitral valve prolapse    Osteoporosis    Dr  Matthew Saras   Pancreatitis 07-2008   Hospitalized    Patella fracture 05/13/2018   Schatzki's ring    Sepsis ([place]) 08/15/2018   Skin cancer    facial x 2. [name]   Past Surgical History:  Procedure Laterality Date   BILIARY STENT PLACEMENT  08/21/2018   Procedure: BILIARY STENT PLACEMENT;  Surgeon: [name], MD;  Location: [facility] ENDOSCOPY;  Service: Gastroenterology;;   COLONOSCOPY  2006   Epidural steroids     x1   ERCP N/A 08/21/2018   Procedure: ENDOSCOPIC RETROGRADE CHOLANGIOPANCREATOGRAPHY (ERCP);  Surgeon: [name], MD;  Location: [place];  Service: Gastroenterology;  Laterality: N/A;   ESOPHAGEAL DILATION  2006   ESOPHAGOGASTRODUODENOSCOPY (EGD) WITH PROPOFOL N/A 11/16/2018   Procedure: ESOPHAGOGASTRODUODENOSCOPY (EGD) WITH PROPOFOL;  Surgeon: [name], MD;  Location: [facility] ENDOSCOPY;  Service: Gastroenterology;  Laterality: N/A;   G3 P4 (twins)     IR CHOLANGIOGRAM EXISTING TUBE  08/18/2018   IR EXCHANGE BILIARY DRAIN  11/03/2018   IR EXCHANGE BILIARY DRAIN  12/01/2018   IR EXCHANGE BILIARY DRAIN  05/13/2019   IR EXCHANGE BILIARY DRAIN  06/14/2019   IR EXCHANGE BILIARY DRAIN  07/19/2019   IR EXCHANGE BILIARY DRAIN  08/17/2019   IR EXCHANGE BILIARY DRAIN  09/30/2019   IR EXCHANGE BILIARY DRAIN  12/08/2019   IR EXCHANGE BILIARY DRAIN  01/19/2020   IR EXCHANGE BILIARY DRAIN  03/01/2020   IR EXCHANGE BILIARY DRAIN  04/20/2020   IR EXCHANGE BILIARY DRAIN  07/06/2020   IR EXCHANGE BILIARY DRAIN  10/26/2020   IR EXCHANGE BILIARY DRAIN  01/18/2021   IR EXCHANGE BILIARY DRAIN  03/05/2021   IR EXCHANGE BILIARY DRAIN  05/25/2021   IR PERC CHOLECYSTOSTOMY  08/16/2018   REMOVAL OF STONES  08/21/2018   Procedure: REMOVAL OF STONES;  Surgeon: [name], MD;  Location: [place];  Service: Gastroenterology;;   SKIN CANCER EXCISION     SPHINCTEROTOMY  08/21/2018   Procedure: SPHINCTEROTOMY;  Surgeon: [name], MD;  Location: [place];  Service: Gastroenterology;;     Current  Meds  Medication Sig   acetaminophen (TYLENOL) 325 MG tablet Take 2 tablets (650 mg total) by mouth every 6 (six) hours as needed for mild pain (or Fever >/= 101).   amiodarone (PACERONE) 200 MG tablet Take 1 tablet (200 mg total) by mouth daily.   apixaban (ELIQUIS) 2.5 MG TABS tablet Take 1 tablet (2.5 mg total) by mouth 2 (two) times daily. Pt is overdue for follow-up, MUST see MD for FUTURE refills.   benzonatate (TESSALON) 200 MG capsule Take 1 capsule (200 mg total) by mouth 2 (two) times daily as needed for cough.   cholecalciferol (VITAMIN D3) 25 MCG (1000 UNIT) tablet Take 1,000 Units by mouth daily.   ferrous sulfate 325 (65 FE) MG EC tablet Take 325 mg by mouth daily.   fluticasone (FLONASE) 50 MCG/ACT nasal spray USE 2 SPRAYS IN EACH NOSTRIL DAILY   levothyroxine (SYNTHROID) 50 MCG tablet Take 1 tablet (50 mcg total) by mouth daily.   magnesium chloride (SLOW-MAG) 64 MG TBEC SR tablet Take 2 tablets (  128 mg total) by mouth daily.   metoprolol tartrate (LOPRESSOR) 25 MG tablet Take 1 tablet (25 mg total) by mouth 2 (two) times daily.   omeprazole (PRILOSEC) 40 MG capsule TAKE 1 CAPSULE DAILY   rosuvastatin (CRESTOR) 20 MG tablet Take 1 tablet (20 mg total) by mouth at bedtime.   sodium chloride 1 g tablet Take 1 tablet (1 g total) by mouth 2 (two) times daily with a meal.   Sodium Chloride Flush (NORMAL SALINE FLUSH) 0.9 % SOLN FLUSH ONCE DAILY AS DIRECTED     Allergies:   Metronidazole and Penicillin g   Social History   Tobacco Use   Smoking status: Former    Types: Cigarettes    Quit date: 06/04/1951    Years since quitting: 70.0   Smokeless tobacco: Never   Tobacco comments:    Smoked 1945-1953 , up to 2 cigarettes/day  Vaping Use   Vaping Use: Never used  Substance Use Topics   Alcohol use: Yes    Alcohol/week: 1.0 standard drink    Types: 1 Glasses of wine per week    Comment: occassional   Drug use: No     Family Hx: The patient's family history includes  Breast cancer in her maternal aunt; Heart attack (age of onset: 55) in her daughter; Heart attack (age of onset: 29) in her sister; Hepatitis in her daughter; Liver cancer in her daughter; Lung disease in her mother. There is no history of Diabetes or Stroke.  ROS:   Please see the history of present illness.    Review of Systems  Constitutional:  Positive for malaise/fatigue.  HENT: Negative.    Respiratory: Negative.    Cardiovascular: Negative.   Gastrointestinal: Negative.   Musculoskeletal:  Positive for back pain.  Neurological: Negative.   Psychiatric/Behavioral: Negative.    All other systems reviewed and are negative.   Labs/Other Tests and Data Reviewed:    Recent Labs: 11/15/2020: TSH 4.29 05/17/2021: Pro B Natriuretic peptide (BNP) 244.0 05/22/2021: B Natriuretic Peptide 190.1 05/24/2021: Magnesium 1.9 05/25/2021: ALT 88 05/26/2021: BUN 25; Creatinine, Ser 0.92; Hemoglobin 8.4; Platelets 198; Potassium 4.6; Sodium 128   Recent Lipid Panel Lab Results  Component Value Date/Time   CHOL 112 11/15/2020 04:42 PM   TRIG 68.0 11/15/2020 04:42 PM   HDL 64.80 11/15/2020 04:42 PM   CHOLHDL 2 11/15/2020 04:42 PM   LDLCALC 33 11/15/2020 04:42 PM   LDLCALC 47 09/17/2019 03:15 PM   LDLDIRECT 121.4 06/17/2011 11:46 AM    Wt Readings from Last 3 Encounters:  06/12/21 98 lb (44.5 kg)  05/24/21 111 lb 14.4 oz (50.8 kg)  05/21/21 98 lb (44.5 kg)     Exam:    BP (!) 138/52 (BP Location: Left Arm, Patient Position: Sitting, Cuff Size: Normal)    Pulse (!) 57    Ht 5' (1.524 m)    Wt 98 lb (44.5 kg)    SpO2 96%    BMI 19.14 kg/m   Constitutional:  oriented to person, place, and time. No distress.  Thin, frail HENT:  Head: Grossly normal Eyes:  no discharge. No scleral icterus.  Neck: No JVD, no carotid bruits  Cardiovascular: Regular rate and rhythm, no murmurs appreciated Pulmonary/Chest: Clear to auscultation bilaterally, no wheezes or rails Abdominal: Soft.  no  distension.  no tenderness.  Musculoskeletal: Normal range of motion Neurological:  normal muscle tone. Coordination normal. No atrophy Skin: Skin warm and dry Psychiatric: normal affect, pleasant   ASSESSMENT & PLAN:  Paroxysmal atrial fibrillation (HCC) on metoprolol 25 twice daily, amiodarone 200 daily Maintaining normal sinus rhythm   Chronic diastolic CHF (congestive heart failure) (HCC) Appears relatively euvolemic, Lasix as needed   Abdominal aortic atherosclerosis ([place])  No further work-up needed given age   Pure hypercholesterolemia No changes to medications Lab work pending   Severe protein-calorie malnutrition ([place]) Weight continues to run low, 98 pounds though relatively stable over the past year     Total encounter time more than 25 minutes  Greater than 50% was spent in counseling and coordination of care with the patient    Signed, Ida Rogue, MD  06/12/2021 11:49 AM    [place] Office [address], [place], [place] [address]

## 2021-06-12 ENCOUNTER — Other Ambulatory Visit: Payer: Self-pay

## 2021-06-12 ENCOUNTER — Ambulatory Visit (INDEPENDENT_AMBULATORY_CARE_PROVIDER_SITE_OTHER): Payer: Medicare Other | Admitting: Cardiovascular Disease

## 2021-06-12 ENCOUNTER — Encounter: Payer: Self-pay | Admitting: Cardiovascular Disease

## 2021-06-12 VITALS — BP 138/52 | HR 57 | Ht 60.0 in | Wt 98.0 lb

## 2021-06-12 DIAGNOSIS — E7800 Pure hypercholesterolemia, unspecified: Secondary | ICD-10-CM | POA: Diagnosis not present

## 2021-06-12 DIAGNOSIS — Z79899 Other long term (current) drug therapy: Secondary | ICD-10-CM

## 2021-06-12 DIAGNOSIS — R600 Localized edema: Secondary | ICD-10-CM | POA: Diagnosis not present

## 2021-06-12 DIAGNOSIS — I4891 Unspecified atrial fibrillation: Secondary | ICD-10-CM | POA: Diagnosis not present

## 2021-06-12 DIAGNOSIS — I700 Atherosclerosis of aorta: Secondary | ICD-10-CM | POA: Diagnosis not present

## 2021-06-12 DIAGNOSIS — E559 Vitamin D deficiency, unspecified: Secondary | ICD-10-CM

## 2021-06-12 DIAGNOSIS — I5032 Chronic diastolic (congestive) heart failure: Secondary | ICD-10-CM

## 2021-06-12 DIAGNOSIS — E038 Other specified hypothyroidism: Secondary | ICD-10-CM

## 2021-06-12 DIAGNOSIS — E43 Unspecified severe protein-calorie malnutrition: Secondary | ICD-10-CM | POA: Diagnosis not present

## 2021-06-12 MED ORDER — AMIODARONE HCL 200 MG PO TABS: 200.0000 mg | ORAL_TABLET | Freq: Every day | ORAL | 3 refills | Status: DC

## 2021-06-12 MED ORDER — METOPROLOL TARTRATE 25 MG PO TABS: 25.0000 mg | ORAL_TABLET | Freq: Two times a day (BID) | ORAL | 3 refills | Status: DC

## 2021-06-12 MED ORDER — APIXABAN 2.5 MG PO TABS: 2.5000 mg | ORAL_TABLET | Freq: Two times a day (BID) | ORAL | 3 refills | Status: DC

## 2021-06-12 MED ORDER — ROSUVASTATIN CALCIUM 20 MG PO TABS: 20.0000 mg | ORAL_TABLET | Freq: Every day | ORAL | 3 refills | Status: DC

## 2021-06-12 NOTE — Patient Instructions (Addendum)
Medication Instructions:  No changes  If you need a refill on your cardiac medications before your next appointment, please call your pharmacy.   Lab work: CMP, CBC, Vit D , lipids today  Testing/Procedures: No new testing needed  Follow-Up: At [facility], you and your health needs are our priority.  As part of our continuing mission to provide you with exceptional heart care, we have created designated Provider Care Teams.  These Care Teams include your primary Cardiologist (physician) and Advanced Practice Providers (APPs -  Physician Assistants and Nurse Practitioners) who all work together to provide you with the care you need, when you need it.  You will need a follow up appointment in 12 months  Providers on your designated Care Team:   Murray Hodgkins, NP Christell Faith, PA-C Cadence Kathlen Mody, [place]  COVID-19 Vaccine Information can be found at: ShippingScam.co.uk For questions related to vaccine distribution or appointments, please email vaccine@[email or web address] .[email or web address] or call [phone])[phone].

## 2021-06-13 DIAGNOSIS — S42202D Unspecified fracture of upper end of left humerus, subsequent encounter for fracture with routine healing: Secondary | ICD-10-CM | POA: Diagnosis not present

## 2021-06-13 DIAGNOSIS — I5032 Chronic diastolic (congestive) heart failure: Secondary | ICD-10-CM | POA: Diagnosis not present

## 2021-06-13 DIAGNOSIS — S01511D Laceration without foreign body of lip, subsequent encounter: Secondary | ICD-10-CM | POA: Diagnosis not present

## 2021-06-13 DIAGNOSIS — I482 Chronic atrial fibrillation, unspecified: Secondary | ICD-10-CM | POA: Diagnosis not present

## 2021-06-13 DIAGNOSIS — D62 Acute posthemorrhagic anemia: Secondary | ICD-10-CM | POA: Diagnosis not present

## 2021-06-13 DIAGNOSIS — S0990XD Unspecified injury of head, subsequent encounter: Secondary | ICD-10-CM | POA: Diagnosis not present

## 2021-06-13 LAB — LIPID PANEL
Chol/HDL Ratio: 2 ratio (ref 0.0–4.4)
Cholesterol, Total: 102 mg/dL (ref 100–199)
HDL: 50 mg/dL (ref >39)
LDL Chol Calc (NIH): 35 mg/dL (ref 0–99)
Triglycerides: 86 mg/dL (ref 0–149)
VLDL Cholesterol Cal: 17 mg/dL (ref 5–40)

## 2021-06-13 LAB — CBC
Hematocrit: 31.6 % — ABNORMAL LOW (ref 34.0–46.6)
Hemoglobin: 10.3 g/dL — ABNORMAL LOW (ref 11.1–15.9)
MCH: 31.6 pg (ref 26.6–33.0)
MCHC: 32.6 g/dL (ref 31.5–35.7)
MCV: 97 fL (ref 79–97)
Platelets: 265 10*3/uL (ref 150–450)
RBC: 3.26 x10E6/uL — ABNORMAL LOW (ref 3.77–5.28)
RDW: 13.5 % (ref 11.7–15.4)
WBC: 6.6 10*3/uL (ref 3.4–10.8)

## 2021-06-13 LAB — COMPREHENSIVE METABOLIC PANEL
ALT: 135 IU/L — ABNORMAL HIGH (ref 0–32)
AST: 102 IU/L — ABNORMAL HIGH (ref 0–40)
Albumin/Globulin Ratio: 1.5 (ref 1.2–2.2)
Albumin: 3.3 g/dL — ABNORMAL LOW (ref 3.5–4.6)
Alkaline Phosphatase: 112 IU/L (ref 44–121)
BUN/Creatinine Ratio: 23 (ref 12–28)
BUN: 19 mg/dL (ref 10–36)
Bilirubin Total: 0.6 mg/dL (ref 0.0–1.2)
CO2: 22 mmol/L (ref 20–29)
Calcium: 8.7 mg/dL (ref 8.7–10.3)
Chloride: 100 mmol/L (ref 96–106)
Creatinine, Ser: 0.83 mg/dL (ref 0.57–1.00)
Globulin, Total: 2.2 g/dL (ref 1.5–4.5)
Glucose: 89 mg/dL (ref 70–99)
Potassium: 5.2 mmol/L (ref 3.5–5.2)
Sodium: 137 mmol/L (ref 134–144)
Total Protein: 5.5 g/dL — ABNORMAL LOW (ref 6.0–8.5)
eGFR: 64 mL/min/{1.73_m2} (ref >59)

## 2021-06-13 LAB — VITAMIN D 25 HYDROXY (VIT D DEFICIENCY, FRACTURES): Vit D, 25-Hydroxy: 251.6 ng/mL — ABNORMAL HIGH (ref 30.0–100.0)

## 2021-06-14 ENCOUNTER — Encounter: Payer: Self-pay | Admitting: Cardiovascular Disease

## 2021-06-14 DIAGNOSIS — S01511D Laceration without foreign body of lip, subsequent encounter: Secondary | ICD-10-CM | POA: Diagnosis not present

## 2021-06-14 DIAGNOSIS — S42202D Unspecified fracture of upper end of left humerus, subsequent encounter for fracture with routine healing: Secondary | ICD-10-CM | POA: Diagnosis not present

## 2021-06-14 DIAGNOSIS — S0990XD Unspecified injury of head, subsequent encounter: Secondary | ICD-10-CM | POA: Diagnosis not present

## 2021-06-14 DIAGNOSIS — I5032 Chronic diastolic (congestive) heart failure: Secondary | ICD-10-CM | POA: Diagnosis not present

## 2021-06-14 DIAGNOSIS — D62 Acute posthemorrhagic anemia: Secondary | ICD-10-CM | POA: Diagnosis not present

## 2021-06-14 DIAGNOSIS — I482 Chronic atrial fibrillation, unspecified: Secondary | ICD-10-CM | POA: Diagnosis not present

## 2021-06-18 ENCOUNTER — Telehealth: Payer: Self-pay

## 2021-06-18 DIAGNOSIS — S01511D Laceration without foreign body of lip, subsequent encounter: Secondary | ICD-10-CM | POA: Diagnosis not present

## 2021-06-18 DIAGNOSIS — Z9229 Personal history of other drug therapy: Secondary | ICD-10-CM

## 2021-06-18 DIAGNOSIS — S0990XD Unspecified injury of head, subsequent encounter: Secondary | ICD-10-CM | POA: Diagnosis not present

## 2021-06-18 DIAGNOSIS — R7989 Other specified abnormal findings of blood chemistry: Secondary | ICD-10-CM

## 2021-06-18 DIAGNOSIS — S42202D Unspecified fracture of upper end of left humerus, subsequent encounter for fracture with routine healing: Secondary | ICD-10-CM | POA: Diagnosis not present

## 2021-06-18 DIAGNOSIS — I482 Chronic atrial fibrillation, unspecified: Secondary | ICD-10-CM | POA: Diagnosis not present

## 2021-06-18 DIAGNOSIS — D62 Acute posthemorrhagic anemia: Secondary | ICD-10-CM | POA: Diagnosis not present

## 2021-06-18 DIAGNOSIS — I5032 Chronic diastolic (congestive) heart failure: Secondary | ICD-10-CM | POA: Diagnosis not present

## 2021-06-18 DIAGNOSIS — R7401 Elevation of levels of liver transaminase levels: Secondary | ICD-10-CM

## 2021-06-18 NOTE — Telephone Encounter (Signed)
Able to reach pt's daughter Butch Penny [facility] approved) regarding Mrs. Severs recent lab work, Dr. Rockey Situ had a chance to review their results and advised   "Labs  Overall okay,  Would stay on the sodium tabs as she is currently doing  Would stop the statin given elevated LFTs  Would recheck LFTs in several weeks time"  Butch Penny is wanting Hulen Skains to be added to [place], pt lives with Hulen Skains is primary caregiver, however he is not listed as DPR on file. Will need to be added.   Otherwise all questions or concerns were address and no additional concerns at this time. Agreeable to have pt come back 1/30 for repeat LFTs after stopping Crestor (rosuvastatin) and will call back for anything further.

## 2021-06-18 NOTE — Telephone Encounter (Signed)
Kelina from [facility] called in regards to Kylie May. She is treating Kylie May for physical therapy. Johnny Bridge has noticed a few bed sores on Kylie May. She states they are a stage 2 at this moment. Johnny Bridge was advised we do not accept verbal orders and she would need to fax. Johnny Bridge is concerned because she doesn't want pts current stage to progress before she can be treated.

## 2021-06-18 NOTE — Telephone Encounter (Signed)
I called and spoke Merleen Nicely and I informed her that we do not do verbal orders and if she faxed the information would have it signed and faxed back and she understood.  Avarie Tavano,cma

## 2021-06-20 DIAGNOSIS — D62 Acute posthemorrhagic anemia: Secondary | ICD-10-CM | POA: Diagnosis not present

## 2021-06-20 DIAGNOSIS — S01511D Laceration without foreign body of lip, subsequent encounter: Secondary | ICD-10-CM | POA: Diagnosis not present

## 2021-06-20 DIAGNOSIS — S42202D Unspecified fracture of upper end of left humerus, subsequent encounter for fracture with routine healing: Secondary | ICD-10-CM | POA: Diagnosis not present

## 2021-06-20 DIAGNOSIS — S0990XD Unspecified injury of head, subsequent encounter: Secondary | ICD-10-CM | POA: Diagnosis not present

## 2021-06-20 DIAGNOSIS — I5032 Chronic diastolic (congestive) heart failure: Secondary | ICD-10-CM | POA: Diagnosis not present

## 2021-06-20 DIAGNOSIS — I482 Chronic atrial fibrillation, unspecified: Secondary | ICD-10-CM | POA: Diagnosis not present

## 2021-06-25 DIAGNOSIS — S42202A Unspecified fracture of upper end of left humerus, initial encounter for closed fracture: Secondary | ICD-10-CM | POA: Diagnosis not present

## 2021-06-26 ENCOUNTER — Ambulatory Visit (INDEPENDENT_AMBULATORY_CARE_PROVIDER_SITE_OTHER): Payer: Medicare Other

## 2021-06-26 ENCOUNTER — Ambulatory Visit
Admission: RE | Admit: 2021-06-26 | Discharge: 2021-06-26 | Disposition: A | Discharge status: Home / Self Care | Payer: Medicare Other | Attending: Adult Health | Admitting: Adult Health

## 2021-06-26 ENCOUNTER — Ambulatory Visit
Admission: RE | Admit: 2021-06-26 | Discharge: 2021-06-26 | Disposition: A | Discharge status: Home / Self Care | Payer: Medicare Other | Source: Ambulatory Visit | Attending: Adult Health | Admitting: Adult Health

## 2021-06-26 ENCOUNTER — Encounter: Payer: Self-pay | Admitting: Adult Health

## 2021-06-26 ENCOUNTER — Other Ambulatory Visit: Payer: Self-pay

## 2021-06-26 ENCOUNTER — Ambulatory Visit (INDEPENDENT_AMBULATORY_CARE_PROVIDER_SITE_OTHER): Payer: Medicare Other | Admitting: Adult Health

## 2021-06-26 VITALS — Ht 60.0 in | Wt 103.0 lb

## 2021-06-26 VITALS — BP 130/82 | HR 58 | Temp 97.7°F | Resp 12 | Ht 60.0 in | Wt 103.6 lb

## 2021-06-26 DIAGNOSIS — Z0000 Encounter for general adult medical examination without abnormal findings: Secondary | ICD-10-CM | POA: Diagnosis not present

## 2021-06-26 DIAGNOSIS — B379 Candidiasis, unspecified: Secondary | ICD-10-CM | POA: Diagnosis not present

## 2021-06-26 DIAGNOSIS — L89301 Pressure ulcer of unspecified buttock, stage 1: Secondary | ICD-10-CM

## 2021-06-26 DIAGNOSIS — K644 Residual hemorrhoidal skin tags: Secondary | ICD-10-CM

## 2021-06-26 DIAGNOSIS — K648 Other hemorrhoids: Secondary | ICD-10-CM | POA: Insufficient documentation

## 2021-06-26 DIAGNOSIS — Z9181 History of falling: Secondary | ICD-10-CM

## 2021-06-26 DIAGNOSIS — K5900 Constipation, unspecified: Secondary | ICD-10-CM | POA: Insufficient documentation

## 2021-06-26 DIAGNOSIS — S42295D Other nondisplaced fracture of upper end of left humerus, subsequent encounter for fracture with routine healing: Secondary | ICD-10-CM | POA: Diagnosis not present

## 2021-06-26 LAB — CBC WITH DIFFERENTIAL/PLATELET
Basophils Absolute: 0 10*3/uL (ref 0.0–0.1)
Basophils Relative: 0.6 % (ref 0.0–3.0)
Eosinophils Absolute: 0.2 10*3/uL (ref 0.0–0.7)
Eosinophils Relative: 1.9 % (ref 0.0–5.0)
HCT: 34.4 % — ABNORMAL LOW (ref 36.0–46.0)
Hemoglobin: 11.1 g/dL — ABNORMAL LOW (ref 12.0–15.0)
Lymphocytes Relative: 17.4 % (ref 12.0–46.0)
Lymphs Abs: 1.4 10*3/uL (ref 0.7–4.0)
MCHC: 32.3 g/dL (ref 30.0–36.0)
MCV: 99.7 fl (ref 78.0–100.0)
Monocytes Absolute: 0.6 10*3/uL (ref 0.1–1.0)
Monocytes Relative: 8.2 % (ref 3.0–12.0)
Neutro Abs: 5.7 10*3/uL (ref 1.4–7.7)
Neutrophils Relative %: 71.9 % (ref 43.0–77.0)
Platelets: 237 10*3/uL (ref 150.0–400.0)
RBC: 3.45 Mil/uL — ABNORMAL LOW (ref 3.87–5.11)
RDW: 16.6 % — ABNORMAL HIGH (ref 11.5–15.5)
WBC: 7.9 10*3/uL (ref 4.0–10.5)

## 2021-06-26 LAB — COMPREHENSIVE METABOLIC PANEL
ALT: 62 U/L — ABNORMAL HIGH (ref 0–35)
AST: 43 U/L — ABNORMAL HIGH (ref 0–37)
Albumin: 3.6 g/dL (ref 3.5–5.2)
Alkaline Phosphatase: 83 U/L (ref 39–117)
BUN: 30 mg/dL — ABNORMAL HIGH (ref 6–23)
CO2: 27 mEq/L (ref 19–32)
Calcium: 10.3 mg/dL (ref 8.4–10.5)
Chloride: 102 mEq/L (ref 96–112)
Creatinine, Ser: 0.87 mg/dL (ref 0.40–1.20)
GFR: 55.75 mL/min — ABNORMAL LOW (ref >60.00)
Glucose, Bld: 111 mg/dL — ABNORMAL HIGH (ref 70–99)
Potassium: 4.2 mEq/L (ref 3.5–5.1)
Sodium: 137 mEq/L (ref 135–145)
Total Bilirubin: 0.4 mg/dL (ref 0.2–1.2)
Total Protein: 6.5 g/dL (ref 6.0–8.3)

## 2021-06-26 LAB — TSH: TSH: 4.68 u[IU]/mL (ref 0.35–5.50)

## 2021-06-26 LAB — MAGNESIUM: Magnesium: 1.9 mg/dL (ref 1.5–2.5)

## 2021-06-26 MED ORDER — NYSTATIN 100000 UNIT/GM EX CREA: 1.0000 "application " | TOPICAL_CREAM | Freq: Two times a day (BID) | CUTANEOUS | 0 refills | Status: DC

## 2021-06-26 MED ORDER — HYDROCORTISONE (PERIANAL) 2.5 % EX CREA: 1.0000 "application " | TOPICAL_CREAM | Freq: Two times a day (BID) | CUTANEOUS | 0 refills | Status: DC

## 2021-06-26 NOTE — Progress Notes (Signed)
Acute Office Visit  Subjective:    Patient ID: Kylie May, female    DOB: 01-Mar-1922, 88 y.o.   MRN: [id number]  Chief Complaint  Patient presents with   [facility] f/u, They would like to discuss GI issues which include pressure sores,hemorrhoids since she hasn't had much mobility.     HPI Patient is in today for follow up visit for hospital follow up 05/24/21 and was discharged 05/26/21. She has had follow up with orthopedics Dr. Sharlet Salina 06/04/21.She had hypotension, lip laceration, SIRS , atrial fibrillation and closed fracture of left proximal humerus.  Seen Dr. Rockey Situ for hospital follow up as well on 06/12/21 denies any worsening shortness of breath or new or changing cardiac symptoms since this viist.   Ct head and spine at er.  Reviewed as below:  IMPRESSION:  1. No intracranial trauma.  2. Age-appropriate atrophy and microvascular disease.  3. No cervical spine fracture.  4. Multilevel disc osteophytic disease. Electronically Signed    By: Suzy Bouchard M.D.    On: 05/22/2021 22:04   Er NOTE REVIEWED:  as follows  05/22/21 patient had her vitals taken and her blood pressure was 58/30.  Recheck with manual blood pressure revealed 60/48.  Patient is very drowsy at this time though this is significant later than her normal bedtime.  She does arouse to verbal stimuli and is able to answer questions at this time.  At this time I will order labs, imaging on the patient.  Patient does have a history of CHF but I will administer 500 mL of fluid as we were evaluating patient.  She does not appear to be fluid overloaded at this time.     Yolanda Bonine and daughter present.  Home health is coming in PT and nursing coming once a week. Health aide for bathing.   Had orthopedics yesterday, she has broken finger, was given permission to move arm as needed.   Concerns for pressure sores on buttocks.  Hemorrhoids. Family reports sores improving using padded gel  bandages.  Yeast rash they used nystatin got from orthopedics.   History of chronic anemia, denies any blood loss or dark tarry stools. Still having some constipation, as above spoke with Dr. Vicente Males [facility] will keep same follow up.   Denies any other falls.  Grandson and daughter present for visit.   Past Medical History:  Diagnosis Date   A-fib ([place])    Acid reflux disease    Arrhythmia    Atrial fibrillation with RVR ([place]) 08/15/2018   Chronic diastolic CHF (congestive heart failure) ([facility])    Compression fracture of lumbar vertebra ([place])    Diverticulitis 01-2008   [facility]   Diverticulosis    Esophageal stricture 2006   Femoral bruit    Right   Fibrocystic breast disease    Hiatal hernia    History of esophageal stricture 06/14/2004   2006 esophageal dilation Schatzki's ring    History of skin cancer 12/29/2008   Basal cell cancer right glabella 07/10/2010 Dr. Evorn Gong, [facility] @ [place] , [place]    Hypercholesterolemia    [place] study LDL goal = < 160   Mitral regurgitation    a. 06/2014 EF 55-60%, elevated end-diastolic pressures, dilated LA at 4.3 cm, mildly dilated RA, severe mitral regurgitation, mild aortic sclerosis without stenosis, mod-severe TR   Mitral valve prolapse    Osteoporosis    Dr Matthew Saras   Pancreatitis 07-2008   Hospitalized  Patella fracture 05/13/2018   Schatzki's ring    Sepsis ([place]) 08/15/2018   Skin cancer    facial x 2. [name]    Past Surgical History:  Procedure Laterality Date   BILIARY STENT PLACEMENT  08/21/2018   Procedure: BILIARY STENT PLACEMENT;  Surgeon: [name], MD;  Location: [facility] ENDOSCOPY;  Service: Gastroenterology;;   COLONOSCOPY  2006   Epidural steroids     x1   ERCP N/A 08/21/2018   Procedure: ENDOSCOPIC RETROGRADE CHOLANGIOPANCREATOGRAPHY (ERCP);  Surgeon: [name], MD;  Location: [facility];  Service: Gastroenterology;  Laterality: N/A;   ESOPHAGEAL DILATION  2006   ESOPHAGOGASTRODUODENOSCOPY (EGD) WITH PROPOFOL  N/A 11/16/2018   Procedure: ESOPHAGOGASTRODUODENOSCOPY (EGD) WITH PROPOFOL;  Surgeon: [name], MD;  Location: [facility] ENDOSCOPY;  Service: Gastroenterology;  Laterality: N/A;   G3 P4 (twins)     IR CHOLANGIOGRAM EXISTING TUBE  08/18/2018   IR EXCHANGE BILIARY DRAIN  11/03/2018   IR EXCHANGE BILIARY DRAIN  12/01/2018   IR EXCHANGE BILIARY DRAIN  05/13/2019   IR EXCHANGE BILIARY DRAIN  06/14/2019   IR EXCHANGE BILIARY DRAIN  07/19/2019   IR EXCHANGE BILIARY DRAIN  08/17/2019   IR EXCHANGE BILIARY DRAIN  09/30/2019   IR EXCHANGE BILIARY DRAIN  12/08/2019   IR EXCHANGE BILIARY DRAIN  01/19/2020   IR EXCHANGE BILIARY DRAIN  03/01/2020   IR EXCHANGE BILIARY DRAIN  04/20/2020   IR EXCHANGE BILIARY DRAIN  07/06/2020   IR EXCHANGE BILIARY DRAIN  10/26/2020   IR EXCHANGE BILIARY DRAIN  01/18/2021   IR EXCHANGE BILIARY DRAIN  03/05/2021   IR EXCHANGE BILIARY DRAIN  05/25/2021   IR PERC CHOLECYSTOSTOMY  08/16/2018   REMOVAL OF STONES  08/21/2018   Procedure: REMOVAL OF STONES;  Surgeon: [name], MD;  Location: [facility];  Service: Gastroenterology;;   SKIN CANCER EXCISION     SPHINCTEROTOMY  08/21/2018   Procedure: Joan Mayans;  Surgeon: [name], MD;  Location: [facility] ENDOSCOPY;  Service: Gastroenterology;;    Family History  Problem Relation Age of Onset   Breast cancer Maternal Aunt    Hepatitis Daughter        C   Lung disease Mother        lung tumor   Heart attack Daughter 87       S/P stents   Liver cancer Daughter    Heart attack Sister 53   Diabetes Neg Hx    Stroke Neg Hx     Social History   Socioeconomic History   Marital status: Widowed    Spouse name: Not on file   Number of children: 3   Years of education: Not on file   Highest education level: Not on file  Occupational History   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  Tobacco Use   Smoking status: Former    Types: Cigarettes    Quit date: 06/04/1951    Years  since quitting: 70.1   Smokeless tobacco: Never   Tobacco comments:    Smoked 1945-1953 , up to 2 cigarettes/day  Vaping Use   Vaping Use: Never used  Substance and Sexual Activity   Alcohol use: Yes    Alcohol/week: 1.0 standard drink    Types: 1 Glasses of wine per week    Comment: occassional   Drug use: No   Sexual activity: Not Currently  Other Topics Concern   Not on file  Social History Narrative   Has living  will    Would want daughter Santiago Glad to be health care POA   Not sure about DNR--definitely doesn't want prolonged life support   Not sure about tube feeds either (but probably not)         Social Determinants of Health   Financial Resource Strain: Low Risk    Difficulty of Paying Living Expenses: Not hard at all  Food Insecurity: No Food Insecurity   Worried About Charity fundraiser in the Last Year: Never true   [place] in the Last Year: Never true  Transportation Needs: No Transportation Needs   Lack of Transportation (Medical): No   Lack of Transportation (Non-Medical): No  Physical Activity: Insufficiently Active   Days of Exercise per Week: 2 days   Minutes of Exercise per Session: 10 min  Stress: Not on file  Social Connections: Moderately Integrated   Frequency of Communication with Friends and Family: More than three times a week   Frequency of Social Gatherings with Friends and Family: More than three times a week   Attends Religious Services: More than 4 times per year   Active Member of [employer] or Organizations: Yes   Attends Archivist Meetings: 1 to 4 times per year   Marital Status: Widowed  Human resources officer Violence: Not At Risk   Fear of Current or Ex-Partner: No   Emotionally Abused: No   Physically Abused: No   Sexually Abused: No    Outpatient Medications Prior to Visit  Medication Sig Dispense Refill   acetaminophen (TYLENOL) 325 MG tablet Take 2 tablets (650 mg total) by mouth every 6 (six) hours as needed for mild  pain (or Fever >/= 101).     amiodarone (PACERONE) 200 MG tablet Take 1 tablet (200 mg total) by mouth daily. 90 tablet 3   apixaban (ELIQUIS) 2.5 MG TABS tablet Take 1 tablet (2.5 mg total) by mouth 2 (two) times daily. Pt is overdue for follow-up, MUST see MD for FUTURE refills. 180 tablet 3   benzonatate (TESSALON) 200 MG capsule Take 1 capsule (200 mg total) by mouth 2 (two) times daily as needed for cough. 20 capsule 0   ferrous sulfate 325 (65 FE) MG EC tablet Take 325 mg by mouth daily.     fluticasone (FLONASE) 50 MCG/ACT nasal spray USE 2 SPRAYS IN EACH NOSTRIL DAILY 16 g 11   levothyroxine (SYNTHROID) 50 MCG tablet Take 1 tablet (50 mcg total) by mouth daily. 90 tablet 3   magnesium chloride (SLOW-MAG) 64 MG TBEC SR tablet Take 2 tablets (128 mg total) by mouth daily. 60 tablet 3   metoprolol tartrate (LOPRESSOR) 25 MG tablet Take 1 tablet (25 mg total) by mouth 2 (two) times daily. 180 tablet 3   omeprazole (PRILOSEC) 40 MG capsule TAKE 1 CAPSULE DAILY 90 capsule 3   sodium chloride 1 g tablet Take 1 tablet (1 g total) by mouth 2 (two) times daily with a meal. 60 tablet 0   Sodium Chloride Flush (NORMAL SALINE FLUSH) 0.9 % SOLN FLUSH ONCE DAILY AS DIRECTED 900 mL 1   rosuvastatin (CRESTOR) 20 MG tablet Take 1 tablet (20 mg total) by mouth at bedtime. (Patient not taking: Reported on 06/26/2021) 90 tablet 3   cholecalciferol (VITAMIN D3) 25 MCG (1000 UNIT) tablet Take 1,000 Units by mouth daily. (Patient not taking: Reported on 06/26/2021)     No facility-administered medications prior to visit.    Allergies  Allergen Reactions   Metronidazole  Other reaction(s): Dizziness and giddiness (finding), Other (qualifier value) made things feel like they were vibrating like the bed and floor,also caused mild dizziness weakness   Penicillin G Rash    Review of Systems  Constitutional:  Positive for fatigue.  HENT: Negative.    Respiratory: Negative.    Cardiovascular: Negative.    Gastrointestinal:  Positive for constipation (is still having bowel movement every 3 days, no bleeding or dark stools reported.). Negative for abdominal distention, abdominal pain, anal bleeding, blood in stool, diarrhea, nausea and rectal pain.  Genitourinary: Negative.   Musculoskeletal:  Positive for arthralgias.  Skin:  Positive for wound. Negative for color change, pallor and rash.  Neurological:  Positive for weakness. Negative for dizziness and light-headedness.  Hematological: Negative.   Psychiatric/Behavioral: Negative.        Objective:    Physical Exam Vitals reviewed.  Constitutional:      Appearance: Normal appearance. She is well-developed.  HENT:     Head: Normocephalic and atraumatic.     Comments: Very hard of hearing.     Right Ear: External ear normal.     Left Ear: External ear normal.     Nose: Nose normal.     Mouth/Throat:     Pharynx: Oropharynx is clear.  Eyes:     Conjunctiva/sclera: Conjunctivae normal.  Cardiovascular:     Rate and Rhythm: Regular rhythm. Bradycardia present.     Pulses: Normal pulses.     Heart sounds: Normal heart sounds.  Pulmonary:     Effort: Pulmonary effort is normal. No respiratory distress.     Breath sounds: Normal breath sounds. No stridor. No wheezing, rhonchi or rales.  Chest:     Chest wall: No tenderness.  Abdominal:     General: Bowel sounds are normal. There is no distension.     Palpations: Abdomen is soft. Abdomen is not rigid. There is no fluid wave or mass.     Tenderness: There is no abdominal tenderness. There is no guarding or rebound.     Hernia: No hernia is present.  Musculoskeletal:        General: Signs of injury (left arm in sling followed with orthopedics) present.     Cervical back: Neck supple.     Right lower leg: No edema.     Left lower leg: No edema.  Skin:    General: Skin is warm and dry.     Findings: No erythema.       Neurological:     Mental Status: She is alert and oriented to  person, place, and time.     Motor: No weakness.     Gait: Gait normal.  Psychiatric:        Speech: Speech normal.        Behavior: Behavior normal.        Thought Content: Thought content normal.    BP 130/82 (BP Location: Left Arm, Patient Position: Sitting, Cuff Size: Small)    Pulse (!) 58    Temp 97.7 F (36.5 C) (Oral)    Resp 12    Ht 5' (1.524 m)    Wt 103 lb 9.6 oz (47 kg)    SpO2 99%    BMI 20.23 kg/m  Wt Readings from Last 3 Encounters:  06/26/21 103 lb (46.7 kg)  06/26/21 103 lb 9.6 oz (47 kg)  06/12/21 98 lb (44.5 kg)    There are no preventive care reminders to display for this patient.   There  are no preventive care reminders to display for this patient.   Lab Results  Component Value Date   TSH 4.68 06/26/2021   Lab Results  Component Value Date   WBC 7.9 06/26/2021   HGB 11.1 (L) 06/26/2021   HCT 34.4 (L) 06/26/2021   MCV 99.7 06/26/2021   PLT 237.0 06/26/2021   Lab Results  Component Value Date   NA 137 06/26/2021   K 4.2 06/26/2021   CO2 27 06/26/2021   GLUCOSE 111 (H) 06/26/2021   BUN 30 (H) 06/26/2021   CREATININE 0.87 06/26/2021   BILITOT 0.4 06/26/2021   ALKPHOS 83 06/26/2021   AST 43 (H) 06/26/2021   ALT 62 (H) 06/26/2021   PROT 6.5 06/26/2021   ALBUMIN 3.6 06/26/2021   CALCIUM 10.3 06/26/2021   ANIONGAP 3 (L) 05/26/2021   EGFR 64 06/12/2021   GFR 55.75 (L) 06/26/2021   Lab Results  Component Value Date   CHOL 102 06/12/2021   Lab Results  Component Value Date   HDL 50 06/12/2021   Lab Results  Component Value Date   LDLCALC 35 06/12/2021   Lab Results  Component Value Date   TRIG 86 06/12/2021   Lab Results  Component Value Date   CHOLHDL 2.0 06/12/2021   No results found for: HGBA1C     Assessment & Plan:   Problem List Items Addressed This Visit       Cardiovascular and Mediastinum   Hemorrhoid prolapse   Relevant Medications   hydrocortisone (ANUSOL-HC) 2.5 % rectal cream     Musculoskeletal and  Integument   Closed fracture of left proximal humerus    Able to move arm now per orthopedics - still in sling. Denies any changes.         Other   History of recent fall    No fall since most recent documented ER visit in 05/2021 per family.       Constipation - Primary   Relevant Orders   Comprehensive metabolic panel (Completed)   TSH (Completed)   Magnesium (Completed)   Ambulatory referral to Gastroenterology   DG Abd 1 View (Completed)   CBC w/Diff (Completed)   Yeast infection    Was on gluteal folds per family, was treated with nystatin cream by orthopedics and now is resolved.       Relevant Medications   nystatin cream (MYCOSTATIN)   Pressure injury of buttock, stage 1    See physical exam note, stage 1 bilateral buttocks wounds. Wound care instructions given. referral to wound care clinic for management given her decreased mobility and increase risk for pressure sores. Preventative measures discussed turning position  Every 1- 2 hours, doughnut pillow, egg crate foam to prevent pressure sores. Signs of infection reviewed and to return if any wound worsening.       Relevant Orders   Ambulatory referral to Wound Clinic     Red Flags discussed. The patient was given clear instructions to go to ER or return to medical center if any red flags develop, symptoms do not improve, worsen or new problems develop. They verbalized understanding.   Meds ordered this encounter  Medications   nystatin cream (MYCOSTATIN)    Sig: Apply 1 application topically 2 (two) times daily.    Dispense:  30 g    Refill:  0   hydrocortisone (ANUSOL-HC) 2.5 % rectal cream    Sig: Place 1 application rectally 2 (two) times daily.    Dispense:  30 g  Refill:  0  Return in about 1 month (around 07/27/2021), or if symptoms worsen or fail to improve, for at any time for any worsening symptoms, Go to Emergency room/ urgent care if worse.   Marcille Buffy, FNP

## 2021-06-26 NOTE — Patient Instructions (Addendum)
Ms. Bieser , Thank you for taking time to come for your Medicare Wellness Visit. I appreciate your ongoing commitment to your health goals. Please review the following plan we discussed and let me know if I can assist you in the future.   These are the goals we discussed:  Goals      Healthy Lifestyle     Healthy diet Stay hydrated Stay active        This is a list of the screening recommended for you and due dates:  Health Maintenance  Topic Date Due   COVID-19 Vaccine (3 - Pfizer risk series) 07/12/2021*   Tetanus Vaccine  05/23/2031   Pneumonia Vaccine  Completed   Flu Shot  Completed   DEXA scan (bone density measurement)  Completed   Zoster (Shingles) Vaccine  Completed   HPV Vaccine  Aged Out  *Topic was postponed. The date shown is not the original due date.    Advanced directives: End of life planning; Advance aging; Advanced directives discussed.  Copy of current HCPOA/Living Will requested.    Conditions/risks identified: none new. Seen by physician in office this morning. All new issues addressed at morning appointment.   Follow up in one year for your annual wellness visit    Preventive Care 65 Years and Older, Female Preventive care refers to lifestyle choices and visits with your health care provider that can promote health and wellness. What does preventive care include? A yearly physical exam. This is also called an annual well check. Dental exams once or twice a year. Routine eye exams. Ask your health care provider how often you should have your eyes checked. Personal lifestyle choices, including: Daily care of your teeth and gums. Regular physical activity. Eating a healthy diet. Avoiding tobacco and drug use. Limiting alcohol use. Practicing safe sex. Taking low-dose aspirin every day. Taking vitamin and mineral supplements as recommended by your health care provider. What happens during an annual well check? The services and screenings done by  your health care provider during your annual well check will depend on your age, overall health, lifestyle risk factors, and family history of disease. Counseling  Your health care provider may ask you questions about your: Alcohol use. Tobacco use. Drug use. Emotional well-being. Home and relationship well-being. Sexual activity. Eating habits. History of falls. Memory and ability to understand (cognition). Work and work Statistician. Reproductive health. Screening  You may have the following tests or measurements: Height, weight, and BMI. Blood pressure. Lipid and cholesterol levels. These may be checked every 5 years, or more frequently if you are over 90 years old. Skin check. Lung cancer screening. You may have this screening every year starting at age 42 if you have a 30-pack-year history of smoking and currently smoke or have quit within the past 15 years. Fecal occult blood test (FOBT) of the stool. You may have this test every year starting at age 49. Flexible sigmoidoscopy or colonoscopy. You may have a sigmoidoscopy every 5 years or a colonoscopy every 10 years starting at age 29. Hepatitis C blood test. Hepatitis B blood test. Sexually transmitted disease (STD) testing. Diabetes screening. This is done by checking your blood sugar (glucose) after you have not eaten for a while (fasting). You may have this done every 1-3 years. Bone density scan. This is done to screen for osteoporosis. You may have this done starting at age 16. Mammogram. This may be done every 1-2 years. Talk to your health care provider about how  often you should have regular mammograms. Talk with your health care provider about your test results, treatment options, and if necessary, the need for more tests. Vaccines  Your health care provider may recommend certain vaccines, such as: Influenza vaccine. This is recommended every year. Tetanus, diphtheria, and acellular pertussis (Tdap, Td) vaccine. You  may need a Td booster every 10 years. Zoster vaccine. You may need this after age 58. Pneumococcal 13-valent conjugate (PCV13) vaccine. One dose is recommended after age 68. Pneumococcal polysaccharide (PPSV23) vaccine. One dose is recommended after age 64. Talk to your health care provider about which screenings and vaccines you need and how often you need them. This information is not intended to replace advice given to you by your health care provider. Make sure you discuss any questions you have with your health care provider. Document Released: 06/16/2015 Document Revised: 02/07/2016 Document Reviewed: 03/21/2015 Elsevier Interactive Patient Education  2017 [place] Prevention in the Home Falls can cause injuries. They can happen to people of all ages. There are many things you can do to make your home safe and to help prevent falls. What can I do on the outside of my home? Regularly fix the edges of walkways and driveways and fix any cracks. Remove anything that might make you trip as you walk through a door, such as a raised step or threshold. Trim any bushes or trees on the path to your home. Use bright outdoor lighting. Clear any walking paths of anything that might make someone trip, such as rocks or tools. Regularly check to see if handrails are loose or broken. Make sure that both sides of any steps have handrails. Any raised decks and porches should have guardrails on the edges. Have any leaves, snow, or ice cleared regularly. Use sand or salt on walking paths during winter. Clean up any spills in your garage right away. This includes oil or grease spills. What can I do in the bathroom? Use night lights. Install grab bars by the toilet and in the tub and shower. Do not use towel bars as grab bars. Use non-skid mats or decals in the tub or shower. If you need to sit down in the shower, use a plastic, non-slip stool. Keep the floor dry. Clean up any water that spills  on the floor as soon as it happens. Remove soap buildup in the tub or shower regularly. Attach bath mats securely with double-sided non-slip rug tape. Do not have throw rugs and other things on the floor that can make you trip. What can I do in the bedroom? Use night lights. Make sure that you have a light by your bed that is easy to reach. Do not use any sheets or blankets that are too big for your bed. They should not hang down onto the floor. Have a firm chair that has side arms. You can use this for support while you get dressed. Do not have throw rugs and other things on the floor that can make you trip. What can I do in the kitchen? Clean up any spills right away. Avoid walking on wet floors. Keep items that you use a lot in easy-to-reach places. If you need to reach something above you, use a strong step stool that has a grab bar. Keep electrical cords out of the way. Do not use floor polish or wax that makes floors slippery. If you must use wax, use non-skid floor wax. Do not have throw rugs and other things  on the floor that can make you trip. What can I do with my stairs? Do not leave any items on the stairs. Make sure that there are handrails on both sides of the stairs and use them. Fix handrails that are broken or loose. Make sure that handrails are as long as the stairways. Check any carpeting to make sure that it is firmly attached to the stairs. Fix any carpet that is loose or worn. Avoid having throw rugs at the top or bottom of the stairs. If you do have throw rugs, attach them to the floor with carpet tape. Make sure that you have a light switch at the top of the stairs and the bottom of the stairs. If you do not have them, ask someone to add them for you. What else can I do to help prevent falls? Wear shoes that: Do not have high heels. Have rubber bottoms. Are comfortable and fit you well. Are closed at the toe. Do not wear sandals. If you use a stepladder: Make  sure that it is fully opened. Do not climb a closed stepladder. Make sure that both sides of the stepladder are locked into place. Ask someone to hold it for you, if possible. Clearly mark and make sure that you can see: Any grab bars or handrails. First and last steps. Where the edge of each step is. Use tools that help you move around (mobility aids) if they are needed. These include: Canes. Walkers. Scooters. Crutches. Turn on the lights when you go into a dark area. Replace any light bulbs as soon as they burn out. Set up your furniture so you have a clear path. Avoid moving your furniture around. If any of your floors are uneven, fix them. If there are any pets around you, be aware of where they are. Review your medicines with your doctor. Some medicines can make you feel dizzy. This can increase your chance of falling. Ask your doctor what other things that you can do to help prevent falls. This information is not intended to replace advice given to you by your health care provider. Make sure you discuss any questions you have with your health care provider. Document Released: 03/16/2009 Document Revised: 10/26/2015 Document Reviewed: 06/24/2014 Elsevier Interactive Patient Education  2017 [employer].

## 2021-06-26 NOTE — Progress Notes (Signed)
Subjective:   TAJE TONDREAU is a 88 y.o. female who presents for Medicare Annual (Subsequent) preventive examination.  Review of Systems    No ROS.  Medicare Wellness Virtual Visit.  Visual/audio telehealth visit, UTA vital signs.   See social history for additional risk factors.   Cardiac Risk Factors include: advanced age (>35men, >53 women)     Objective:    Today's Vitals   06/26/21 1319  Weight: 103 lb (46.7 kg)  Height: 5' (1.524 m)   Body mass index is 20.12 kg/m.  Advanced Directives 06/26/2021 05/24/2021 05/22/2021 06/23/2020 06/22/2019 11/16/2018 08/18/2018  Does Patient Have a Medical Advance Directive? Yes - No Yes Yes Yes Yes  Type of Paramedic of [place];Living will - - [place];Living will Healthcare Power of [place]  Does patient want to make changes to medical advance directive? No - Patient declined - - No - Patient declined No - Patient declined - No - Patient declined  Copy of [place] in Chart? No - copy requested - - No - copy requested No - copy requested - No - copy requested  Would patient like information on creating a medical advance directive? - No - Patient declined;No - Guardian declined - - - - No - Patient declined    Current Medications (verified) Outpatient Encounter Medications as of 06/26/2021  Medication Sig   acetaminophen (TYLENOL) 325 MG tablet Take 2 tablets (650 mg total) by mouth every 6 (six) hours as needed for mild pain (or Fever >/= 101).   amiodarone (PACERONE) 200 MG tablet Take 1 tablet (200 mg total) by mouth daily.   apixaban (ELIQUIS) 2.5 MG TABS tablet Take 1 tablet (2.5 mg total) by mouth 2 (two) times daily. Pt is overdue for follow-up, MUST see MD for FUTURE refills.   benzonatate (TESSALON) 200 MG capsule Take 1 capsule (200 mg total) by mouth 2 (two) times daily as needed for cough.   cholecalciferol (VITAMIN D3) 25 MCG  (1000 UNIT) tablet Take 1,000 Units by mouth daily. (Patient not taking: Reported on 06/26/2021)   ferrous sulfate 325 (65 FE) MG EC tablet Take 325 mg by mouth daily.   fluticasone (FLONASE) 50 MCG/ACT nasal spray USE 2 SPRAYS IN EACH NOSTRIL DAILY   hydrocortisone (ANUSOL-HC) 2.5 % rectal cream Place 1 application rectally 2 (two) times daily.   levothyroxine (SYNTHROID) 50 MCG tablet Take 1 tablet (50 mcg total) by mouth daily.   magnesium chloride (SLOW-MAG) 64 MG TBEC SR tablet Take 2 tablets (128 mg total) by mouth daily.   metoprolol tartrate (LOPRESSOR) 25 MG tablet Take 1 tablet (25 mg total) by mouth 2 (two) times daily.   nystatin cream (MYCOSTATIN) Apply 1 application topically 2 (two) times daily.   omeprazole (PRILOSEC) 40 MG capsule TAKE 1 CAPSULE DAILY   rosuvastatin (CRESTOR) 20 MG tablet Take 1 tablet (20 mg total) by mouth at bedtime. (Patient not taking: Reported on 06/26/2021)   sodium chloride 1 g tablet Take 1 tablet (1 g total) by mouth 2 (two) times daily with a meal.   Sodium Chloride Flush (NORMAL SALINE FLUSH) 0.9 % SOLN FLUSH ONCE DAILY AS DIRECTED   No facility-administered encounter medications on file as of 06/26/2021.    Allergies (verified) Metronidazole and Penicillin g   History: Past Medical History:  Diagnosis Date   A-fib (HCC)    Acid reflux disease    Arrhythmia    Atrial  fibrillation with RVR (HCC) 08/15/2018   Chronic diastolic CHF (congestive heart failure) (HCC)    Compression fracture of lumbar vertebra (HCC)    Diverticulitis 01-2008   GI [place]   Diverticulosis    Esophageal stricture 2006   Femoral bruit    Right   Fibrocystic breast disease    Hiatal hernia    History of esophageal stricture 06/14/2004   2006 esophageal dilation Schatzki's ring    History of skin cancer 12/29/2008   Basal cell cancer right glabella 07/10/2010 Dr. Evorn Gong, [facility] @ [place] , [place]    Hypercholesterolemia    [place] study LDL goal = < 160   Mitral  regurgitation    a. 06/2014 EF 55-60%, elevated end-diastolic pressures, dilated LA at 4.3 cm, mildly dilated RA, severe mitral regurgitation, mild aortic sclerosis without stenosis, mod-severe TR   Mitral valve prolapse    Osteoporosis    Dr Matthew Saras   Pancreatitis 07-2008   Hospitalized    Patella fracture 05/13/2018   Schatzki's ring    Sepsis ([place]) 08/15/2018   Skin cancer    facial x 2. [name]   Past Surgical History:  Procedure Laterality Date   BILIARY STENT PLACEMENT  08/21/2018   Procedure: BILIARY STENT PLACEMENT;  Surgeon: [name], MD;  Location: [facility] ENDOSCOPY;  Service: Gastroenterology;;   COLONOSCOPY  2006   Epidural steroids     x1   ERCP N/A 08/21/2018   Procedure: ENDOSCOPIC RETROGRADE CHOLANGIOPANCREATOGRAPHY (ERCP);  Surgeon: [name], MD;  Location: [facility];  Service: Gastroenterology;  Laterality: N/A;   ESOPHAGEAL DILATION  2006   ESOPHAGOGASTRODUODENOSCOPY (EGD) WITH PROPOFOL N/A 11/16/2018   Procedure: ESOPHAGOGASTRODUODENOSCOPY (EGD) WITH PROPOFOL;  Surgeon: [name], MD;  Location: [facility] ENDOSCOPY;  Service: Gastroenterology;  Laterality: N/A;   G3 P4 (twins)     IR CHOLANGIOGRAM EXISTING TUBE  08/18/2018   IR EXCHANGE BILIARY DRAIN  11/03/2018   IR EXCHANGE BILIARY DRAIN  12/01/2018   IR EXCHANGE BILIARY DRAIN  05/13/2019   IR EXCHANGE BILIARY DRAIN  06/14/2019   IR EXCHANGE BILIARY DRAIN  07/19/2019   IR EXCHANGE BILIARY DRAIN  08/17/2019   IR EXCHANGE BILIARY DRAIN  09/30/2019   IR EXCHANGE BILIARY DRAIN  12/08/2019   IR EXCHANGE BILIARY DRAIN  01/19/2020   IR EXCHANGE BILIARY DRAIN  03/01/2020   IR EXCHANGE BILIARY DRAIN  04/20/2020   IR EXCHANGE BILIARY DRAIN  07/06/2020   IR EXCHANGE BILIARY DRAIN  10/26/2020   IR EXCHANGE BILIARY DRAIN  01/18/2021   IR EXCHANGE BILIARY DRAIN  03/05/2021   IR EXCHANGE BILIARY DRAIN  05/25/2021   IR PERC CHOLECYSTOSTOMY  08/16/2018   REMOVAL OF STONES  08/21/2018   Procedure: REMOVAL OF STONES;  Surgeon:  [name], MD;  Location: [place];  Service: Gastroenterology;;   SKIN CANCER EXCISION     SPHINCTEROTOMY  08/21/2018   Procedure: Joan Mayans;  Surgeon: [name], MD;  Location: [facility] ENDOSCOPY;  Service: Gastroenterology;;   Family History  Problem Relation Age of Onset   Breast cancer Maternal Aunt    Hepatitis Daughter        C   Lung disease Mother        lung tumor   Heart attack Daughter 53       S/P stents   Liver cancer Daughter    Heart attack Sister 59   Diabetes Neg Hx    Stroke Neg Hx    Social History   Socioeconomic History  Marital status: Widowed    Spouse name: Not on file   Number of children: 3   Years of education: Not on file   Highest education level: Not on file  Occupational History   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  Tobacco Use   Smoking status: Former    Types: Cigarettes    Quit date: 06/04/1951    Years since quitting: 70.1   Smokeless tobacco: Never   Tobacco comments:    Smoked 1945-1953 , up to 2 cigarettes/day  Vaping Use   Vaping Use: Never used  Substance and Sexual Activity   Alcohol use: Yes    Alcohol/week: 1.0 standard drink    Types: 1 Glasses of wine per week    Comment: occassional   Drug use: No   Sexual activity: Not Currently  Other Topics Concern   Not on file  Social History Narrative   Has living will    Would want daughter Santiago Glad to be health care POA   Not sure about DNR--definitely doesn't want prolonged life support   Not sure about tube feeds either (but probably not)         Social Determinants of Health   Financial Resource Strain: Low Risk    Difficulty of Paying Living Expenses: Not hard at all  Food Insecurity: No Food Insecurity   Worried About Charity fundraiser in the Last Year: Never true   Ran Out of Food in the Last Year: Never true  Transportation Needs: No Transportation Needs   Lack of Transportation (Medical): No    Lack of Transportation (Non-Medical): No  Physical Activity: Insufficiently Active   Days of Exercise per Week: 2 days   Minutes of Exercise per Session: 10 min  Stress: Not on file  Social Connections: Moderately Integrated   Frequency of Communication with Friends and Family: More than three times a week   Frequency of Social Gatherings with Friends and Family: More than three times a week   Attends Religious Services: More than 4 times per year   Active Member of [employer] or Organizations: Yes   Attends Archivist Meetings: 1 to 4 times per year   Marital Status: Widowed    Tobacco Counseling Counseling given: Not Answered Tobacco comments: Smoked [phone] , up to 2 cigarettes/day   Clinical Intake:  Pre-visit preparation completed: Yes        Diabetes: No  How often do you need to have someone help you when you read instructions, pamphlets, or other written materials from your doctor or pharmacy?: [place] Needed?: No      Activities of Daily Living In your present state of health, do you have any difficulty performing the following activities: 06/26/2021 05/24/2021  Hearing? Y Y  Comment Hearing aids -  Vision? N Y  Difficulty concentrating or making decisions? N N  Comment Age appropriate. -  Walking or climbing stairs? Y Y  Comment Unsteady gait. Walker in use ambulatory -  Dressing or bathing? Tempie Donning  Comment Family assist -  Doing errands, shopping? Y N  Comment She does not drive or run errands Facilities manager and eating ? Y -  Comment Family assist with meal prep. Self feeds. -  Using the Toilet? Y -  Comment L shoulder break/fracture -  In the past six months, have you accidently leaked urine? Y -  Comment Managed with depend brief -  Do you  have problems with loss of bowel control? N -  Managing your Medications? Y -  Comment Family assist -  Managing your Finances? Y -  Comment Family assist -  Housekeeping or managing your  Housekeeping? Y -  Comment Family assist -  Some recent data might be hidden    Patient Care Team: [name], MD as PCP - General (Family Medicine) Rockey Situ, Kathlene November, MD as PCP - Cardiology (Cardiology) Minna Merritts, MD as Consulting Physician (Cardiology)  Indicate any recent Medical Services you may have received from other than [facility] providers in the past year (date may be approximate).     Assessment:   This is a routine wellness examination for [place].  Virtual Visit via Telephone Note  I connected with  RAIDEN YEARWOOD on 06/26/21 at  1:15 PM EST by telephone and verified that I am speaking with the correct person using two identifiers.  Persons participating in the virtual visit: patient/grandson/Nurse Health Advisor   I discussed the limitations, risks, security and privacy concerns of performing an evaluation and management service by telephone and the availability of in person appointments. The patient expressed understanding and agreed to proceed.  Interactive audio and video telecommunications were attempted between this nurse and patient, however failed, due to patient having technical difficulties OR patient did not have access to video capability.  We continued and completed visit with audio only.  Some vital signs may be absent or patient reported.   Hearing/Vision screen Hearing Screening - Comments:: Hearing aid, bilateral Vision Screening - Comments:: Followed by [facility]  Sometimes wears corrective lenses  Cataract extraction, bilateral  They have regular follow up with the ophthalmologist  Dietary issues and exercise activities discussed: Current Exercise Habits: Home exercise routine, Type of exercise: walking;strength training/weights (Physical Therapy), Time (Minutes): 60, Frequency (Times/Week): 1, Weekly Exercise (Minutes/Week): 60, Intensity: Mild Regular diet   Goals Addressed             This Visit's Progress     COMPLETED: DIET - INCREASE WATER INTAKE       Stay hydrated     Healthy Lifestyle       Healthy diet Stay hydrated Stay active       Depression Screen PHQ 2/9 Scores 06/26/2021 05/21/2021 11/15/2020 06/23/2020 05/10/2020 09/17/2019 06/22/2019  PHQ - 2 Score 0 0 0 0 0 0 0  Exception Documentation Other- indicate reason in comment box - - - - - -  Not completed Did not fill out - - - - - -    Fall Risk Fall Risk  06/26/2021 05/17/2021 11/15/2020 06/23/2020 05/10/2020  Falls in the past year? 1 0 0 1 1  Comment - - - - -  Number falls in past yr: 1 0 0 1 0  Injury with Fall? 1 0 - 0 0  Risk Factor Category  - - - - -  Risk for fall due to : History of fall(s) - - Impaired balance/gait;History of fall(s) -  Follow up Falls evaluation completed Falls evaluation completed Falls evaluation completed Falls evaluation completed Falls evaluation completed    [place]: Home free of loose throw rugs in walkways, pet beds, electrical cords, etc? Yes  Adequate lighting in your home to reduce risk of falls? Yes   ASSISTIVE DEVICES UTILIZED TO PREVENT FALLS: Life alert? No  Use of a cane, walker or w/c? Yes   TIMED UP AND GO: Was the test performed?  No .   Cognitive Function: Patient is alert. MMSE - Mini Mental State Exam 06/22/2019 05/30/2016  Not completed: Unable to complete -  Orientation to time - 5  Orientation to Place - 5  Registration - 3  Attention/ Calculation - 5  Recall - 3  Language- name 2 objects - 2  Language- repeat - 1  Language- follow 3 step command - 3  Language- read & follow direction - 1  Write a sentence - 1  Copy design - 1  Total score - 30     6CIT Screen 06/11/2018 05/30/2017  What Year? 0 points 0 points  What month? 0 points 0 points  What time? 0 points 0 points  Count back from 20 0 points 0 points  Months in reverse 0 points 0 points  Repeat phrase 0 points 0 points  Total Score 0 0    Immunizations Immunization History  Administered Date(s) Administered   Fluad Quad(high Dose 65+) 02/26/2019, 03/17/2021   H1N1 08/08/2008   Influenza, High Dose Seasonal PF 02/24/2018   Influenza,inj,Quad PF,6+ Mos 02/17/2014   Influenza-Unspecified 03/03/2016, 02/24/2017, 03/29/2020   PFIZER(Purple Top)SARS-COV-2 Vaccination 06/21/2019, 07/19/2019   Pneumococcal Conjugate-13 02/17/2014   Pneumococcal Polysaccharide-23 11/10/2012   Td 01/09/2010, 05/22/2021   Tdap 04/18/2016   Zoster Recombinat (Shingrix) 11/25/2016, 04/12/2017   Covid vaccine- Agrees to update immunization record.   Screening Tests Health Maintenance  Topic Date Due   COVID-19 Vaccine (3 - Pfizer risk series) 07/12/2021 (Originally 08/16/2019)   TETANUS/TDAP  05/23/2031   Pneumonia Vaccine 59+ Years old  Completed   INFLUENZA VACCINE  Completed   DEXA SCAN  Completed   Zoster Vaccines- Shingrix  Completed   HPV VACCINES  Aged Out   Health Maintenance There are no preventive care reminders to display for this patient.  Lung Cancer Screening: (Low Dose CT Chest recommended if Age 61-80 years, 30 pack-year currently smoking OR have quit w/in 15years.) does not qualify.   Hepatitis C Screening: does not qualify  Vision Screening: Recommended annual ophthalmology exams for early detection of glaucoma and other disorders of the eye.  Dental Screening: Recommended annual dental exams for proper oral hygiene  Community Resource Referral / Chronic Care Management: CRR required this visit?  No   CCM required this visit?  No      Plan:   Keep all routine maintenance appointments.   I have personally reviewed and noted the following in the patients chart:   Medical and social history Use of alcohol, tobacco or illicit drugs  Current medications and supplements including opioid prescriptions.  Functional ability and status Nutritional status Physical activity Advanced directives List of other  physicians Hospitalizations, surgeries, and ER visits in previous 12 months Vitals Screenings to include cognitive, depression, and falls Referrals and appointments  In addition, I have reviewed and discussed with patient certain preventive protocols, quality metrics, and best practice recommendations. A written personalized care plan for preventive services as well as general preventive health recommendations were provided to patient.     Varney Biles, LPN   [id number]

## 2021-06-26 NOTE — Patient Instructions (Signed)
Constipation, Adult Constipation is when a person has trouble pooping (having a bowel movement). When you have this condition, you may poop fewer than 3 times a week. Your poop (stool) may also be dry, hard, or bigger than normal. Follow these instructions at home: Eating and drinking  Eat foods that have a lot of fiber, such as: Fresh fruits and vegetables. Whole grains. Beans. Eat less of foods that are low in fiber and high in fat and sugar, such as: [place] fries. Hamburgers. Cookies. Candy. Soda. Drink enough fluid to keep your pee (urine) pale yellow. General instructions Exercise regularly or as told by your doctor. Try to do 150 minutes of exercise each week. Go to the restroom when you feel like you need to poop. Do not hold it in. Take over-the-counter and prescription medicines only as told by your doctor. These include any fiber supplements. When you poop: Do deep breathing while relaxing your lower belly (abdomen). Relax your pelvic floor. The pelvic floor is a group of muscles that support the rectum, bladder, and intestines (as well as the uterus in women). Watch your condition for any changes. Tell your doctor if you notice any. Keep all follow-up visits as told by your doctor. This is important. Contact a doctor if: You have pain that gets worse. You have a fever. You have not pooped for 4 days. You vomit. You are not hungry. You lose weight. You are bleeding from the opening of the butt (anus). You have thin, pencil-like poop. Get help right away if: You have a fever, and your symptoms suddenly get worse. You leak poop or have blood in your poop. Your belly feels hard or bigger than normal (bloated). You have very bad belly pain. You feel dizzy or you faint. Summary Constipation is when a person poops fewer than 3 times a week, has trouble pooping, or has poop that is dry, hard, or bigger than normal. Eat foods that have a lot of fiber. Drink enough fluid  to keep your pee (urine) pale yellow. Take over-the-counter and prescription medicines only as told by your doctor. These include any fiber supplements. This information is not intended to replace advice given to you by your health care provider. Make sure you discuss any questions you have with your health care provider. Document Revised: 04/07/2019 Document Reviewed: 04/07/2019 Elsevier Patient Education  2022 [place], Adult Taking care of your wound properly can help to prevent pain, infection, and scarring. It can also help your wound heal more quickly. Follow instructions from your health care provider about how to care for your wound. Supplies needed: Soap and water. Wound cleanser, saline, or germ-free (sterile) water. Gauze. If needed, a clean bandage (dressing) or other type of wound dressing material to cover or place in the wound. Follow your health care provider's instructions about what dressing supplies to use. Cream or topical ointment to apply to the wound, if told by your health care provider. How to care for your wound Cleaning the wound Ask your health care provider how to clean the wound. This may include: Using mild soap and water, a wound cleanser, saline, or sterile water. Using a clean gauze to pat the wound dry after cleaning it. Do not rub or scrub the wound. Dressing care Wash your hands with soap and water for at least 20 seconds before and after you change the dressing. If soap and water are not available, use hand sanitizer. Change your dressing as told by your  health care provider. This may include: Cleaning or rinsing out (irrigating) the wound. Application of cream or topical ointment, if told by your health care provider. Placing a dressing over the wound or in the wound (packing). Covering the wound with an outer dressing. Leave stitches (sutures), staples, skin glue, or adhesive strips in place. These skin closures may need to stay in  place for 2 weeks or longer. If adhesive strip edges start to loosen and curl up, you may trim the loose edges. Do not remove adhesive strips completely unless your health care provider tells you to do that. Ask your health care provider when you can leave the wound uncovered. Checking for infection Check your wound area every day for signs of infection. Check for: More redness, swelling, or pain. Fluid or blood. Warmth. Pus or a bad smell.  Follow these instructions at home Medicines If you were prescribed an antibiotic medicine, cream, or ointment, take or apply it as told by your health care provider. Do not stop using the antibiotic even if your condition improves. If you were prescribed pain medicine, take it 30 minutes before you do any wound care or as told by your health care provider. Take over-the-counter and prescription medicines only as told by your health care provider. Eating and drinking Eat a diet that includes protein, vitamin A, vitamin C, and other nutrient-rich foods to help the wound heal. Foods rich in protein include meat, fish, eggs, dairy, beans, and nuts. Foods rich in vitamin A include carrots and dark green, leafy vegetables. Foods rich in vitamin C include citrus fruits, tomatoes, broccoli, and peppers. Drink enough fluid to keep your urine pale yellow. General instructions Do not take baths, swim, or use a hot tub until your health care provider approves. Ask your health care provider if you may take showers. You may only be allowed to take sponge baths. Do not scratch or pick at the wound. Keep it covered as told by your health care provider. Return to your normal activities as told by your health care provider. Ask your health care provider what activities are safe for you. Protect your wound from the sun when you are outside for the first 6 months, or for as long as told by your health care provider. Cover up the scar area or apply sunscreen that has an SPF  of at least 84. Do not use any products that contain nicotine or tobacco. These products include cigarettes, chewing tobacco, and vaping devices, such as e-cigarettes. If you need help quitting, ask your health care provider. Keep all follow-up visits. This is important. Contact a health care provider if: You received a tetanus shot and you have swelling, severe pain, redness, or bleeding at the injection site. Your pain is not controlled with medicine. You have any of these signs of infection: More redness, swelling, or pain around the wound. Fluid or blood coming from the wound. Warmth coming from the wound. A fever or chills. You are nauseous or you vomit. You are dizzy. You have a new rash or hardness around the wound. Get help right away if: You have a red streak of skin near the area around your wound. Pus or a bad smell coming from the wound. Your wound has been closed with staples, sutures, skin glue, or adhesive strips and it begins to open up and separate. Your wound is bleeding, and the bleeding does not stop with gentle pressure. These symptoms may represent a serious problem that is an emergency.  Do not wait to see if the symptoms will go away. Get medical help right away. Call your local emergency services (911 in the [place] Do not drive yourself to the hospital. Summary Always wash your hands with soap and water for at least 20 seconds before and after changing your dressing. Change your dressing as told by your health care provider. To help with healing, eat foods that are rich in protein, vitamin A, vitamin C, and other nutrients. Check your wound every day for signs of infection. Contact your health care provider if you think that your wound is infected. This information is not intended to replace advice given to you by your health care provider. Make sure you discuss any questions you have with your health care provider. Document Revised: 09/26/2020 Document Reviewed:  09/26/2020 Elsevier Patient Education  [place].

## 2021-06-26 NOTE — Progress Notes (Signed)
CBC still with chronic anemia, I am going to recommend hematology/oncology  consult with this and the weight loss for management of anemia let me know preference for location - [facility] clinic  ? [name] BUN is elevated slightly more, please watch for any signs of blood in stool or dark tarry stools. ALT and AST elevated however is improved. GFR kidney function stable but decreased.  Please keep gastroenterology evaluation as well.  Sodium is stable, continue as ordered keep follow up with cardiology.  TSH and magnesium is within normal limits.  Also reviewed Vitamin D levels done at Dr. Debria Garret office - need to hold vitamin D if not doing already. Schedule follow up with [name], MD  CC Dr. Marius Ditch to see if GI office can see patient sooner.

## 2021-06-27 ENCOUNTER — Other Ambulatory Visit: Payer: Self-pay | Admitting: Adult Health

## 2021-06-27 DIAGNOSIS — S42202D Unspecified fracture of upper end of left humerus, subsequent encounter for fracture with routine healing: Secondary | ICD-10-CM | POA: Diagnosis not present

## 2021-06-27 DIAGNOSIS — I482 Chronic atrial fibrillation, unspecified: Secondary | ICD-10-CM | POA: Diagnosis not present

## 2021-06-27 DIAGNOSIS — S01511D Laceration without foreign body of lip, subsequent encounter: Secondary | ICD-10-CM | POA: Diagnosis not present

## 2021-06-27 DIAGNOSIS — L89301 Pressure ulcer of unspecified buttock, stage 1: Secondary | ICD-10-CM | POA: Insufficient documentation

## 2021-06-27 DIAGNOSIS — S0990XD Unspecified injury of head, subsequent encounter: Secondary | ICD-10-CM | POA: Diagnosis not present

## 2021-06-27 DIAGNOSIS — E43 Unspecified severe protein-calorie malnutrition: Secondary | ICD-10-CM

## 2021-06-27 DIAGNOSIS — D62 Acute posthemorrhagic anemia: Secondary | ICD-10-CM | POA: Diagnosis not present

## 2021-06-27 DIAGNOSIS — B379 Candidiasis, unspecified: Secondary | ICD-10-CM | POA: Insufficient documentation

## 2021-06-27 DIAGNOSIS — I5032 Chronic diastolic (congestive) heart failure: Secondary | ICD-10-CM | POA: Diagnosis not present

## 2021-06-27 DIAGNOSIS — D649 Anemia, unspecified: Secondary | ICD-10-CM

## 2021-06-27 NOTE — Progress Notes (Signed)
Orders Placed This Encounter  Procedures   Iron, TIBC and Ferritin Panel    Standing Status:   Future    Standing Expiration Date:   06/27/2022   B12 and Folate Panel    Standing Status:   Future    Standing Expiration Date:   06/27/2022

## 2021-06-27 NOTE — Assessment & Plan Note (Signed)
Able to move arm now per orthopedics - still in sling. Denies any changes.

## 2021-06-27 NOTE — Assessment & Plan Note (Signed)
Was on gluteal folds per family, was treated with nystatin cream by orthopedics and now is resolved.

## 2021-06-27 NOTE — Progress Notes (Signed)
Mild to moderate stool burden, no significant stool burden, would continue colace per package instructions.  Cholecystomy tube remains in place right upper quadrant.  Has follow up with gastroenterology.  No bowel obstruction seen.   Of note numerous compression deformities of lumbar spine not changed since previous CT. Likely age related, orthopedics always available if they prefer a referral.

## 2021-06-27 NOTE — Assessment & Plan Note (Signed)
See physical exam note, stage 1 bilateral buttocks wounds. Wound care instructions given. referral to wound care clinic for management given her decreased mobility and increase risk for pressure sores. Preventative measures discussed turning position  Every 1- 2 hours, doughnut pillow, egg crate foam to prevent pressure sores. Signs of infection reviewed and to return if any wound worsening.

## 2021-06-27 NOTE — Progress Notes (Signed)
Noted hematology consult placed. Should hear within 2 weeks and if not let office know.

## 2021-06-27 NOTE — Assessment & Plan Note (Signed)
No fall since most recent documented ER visit in 05/2021 per family.

## 2021-06-27 NOTE — Progress Notes (Signed)
Add on labs have patient schedule.  Orders Placed This Encounter Procedures  Iron, TIBC and Ferritin Panel   Standing Status:   Future   Standing Expiration Date:   06/27/2022  B12 and Folate Panel   Standing Status:   Future   Standing Expiration Date:   06/27/2022

## 2021-06-28 DIAGNOSIS — I5032 Chronic diastolic (congestive) heart failure: Secondary | ICD-10-CM | POA: Diagnosis not present

## 2021-06-28 DIAGNOSIS — I482 Chronic atrial fibrillation, unspecified: Secondary | ICD-10-CM | POA: Diagnosis not present

## 2021-06-28 DIAGNOSIS — S01511D Laceration without foreign body of lip, subsequent encounter: Secondary | ICD-10-CM | POA: Diagnosis not present

## 2021-06-28 DIAGNOSIS — S42202D Unspecified fracture of upper end of left humerus, subsequent encounter for fracture with routine healing: Secondary | ICD-10-CM | POA: Diagnosis not present

## 2021-06-28 DIAGNOSIS — D62 Acute posthemorrhagic anemia: Secondary | ICD-10-CM | POA: Diagnosis not present

## 2021-06-28 DIAGNOSIS — S0990XD Unspecified injury of head, subsequent encounter: Secondary | ICD-10-CM | POA: Diagnosis not present

## 2021-06-29 ENCOUNTER — Telehealth: Payer: Self-pay | Admitting: Family Medicine

## 2021-06-29 DIAGNOSIS — Z7901 Long term (current) use of anticoagulants: Secondary | ICD-10-CM | POA: Diagnosis not present

## 2021-06-29 DIAGNOSIS — N6019 Diffuse cystic mastopathy of unspecified breast: Secondary | ICD-10-CM | POA: Diagnosis not present

## 2021-06-29 DIAGNOSIS — Z434 Encounter for attention to other artificial openings of digestive tract: Secondary | ICD-10-CM | POA: Diagnosis not present

## 2021-06-29 DIAGNOSIS — I5032 Chronic diastolic (congestive) heart failure: Secondary | ICD-10-CM | POA: Diagnosis not present

## 2021-06-29 DIAGNOSIS — K219 Gastro-esophageal reflux disease without esophagitis: Secondary | ICD-10-CM | POA: Diagnosis not present

## 2021-06-29 DIAGNOSIS — Z85828 Personal history of other malignant neoplasm of skin: Secondary | ICD-10-CM | POA: Diagnosis not present

## 2021-06-29 DIAGNOSIS — M810 Age-related osteoporosis without current pathological fracture: Secondary | ICD-10-CM | POA: Diagnosis not present

## 2021-06-29 DIAGNOSIS — S01511D Laceration without foreign body of lip, subsequent encounter: Secondary | ICD-10-CM | POA: Diagnosis not present

## 2021-06-29 DIAGNOSIS — S0990XD Unspecified injury of head, subsequent encounter: Secondary | ICD-10-CM | POA: Diagnosis not present

## 2021-06-29 DIAGNOSIS — Z8781 Personal history of (healed) traumatic fracture: Secondary | ICD-10-CM | POA: Diagnosis not present

## 2021-06-29 DIAGNOSIS — Z7951 Long term (current) use of inhaled steroids: Secondary | ICD-10-CM | POA: Diagnosis not present

## 2021-06-29 DIAGNOSIS — D62 Acute posthemorrhagic anemia: Secondary | ICD-10-CM | POA: Diagnosis not present

## 2021-06-29 DIAGNOSIS — I081 Rheumatic disorders of both mitral and tricuspid valves: Secondary | ICD-10-CM | POA: Diagnosis not present

## 2021-06-29 DIAGNOSIS — K449 Diaphragmatic hernia without obstruction or gangrene: Secondary | ICD-10-CM | POA: Diagnosis not present

## 2021-06-29 DIAGNOSIS — E871 Hypo-osmolality and hyponatremia: Secondary | ICD-10-CM | POA: Diagnosis not present

## 2021-06-29 DIAGNOSIS — S42202D Unspecified fracture of upper end of left humerus, subsequent encounter for fracture with routine healing: Secondary | ICD-10-CM | POA: Diagnosis not present

## 2021-06-29 DIAGNOSIS — I482 Chronic atrial fibrillation, unspecified: Secondary | ICD-10-CM | POA: Diagnosis not present

## 2021-06-29 DIAGNOSIS — Z9049 Acquired absence of other specified parts of digestive tract: Secondary | ICD-10-CM | POA: Diagnosis not present

## 2021-06-29 DIAGNOSIS — K5790 Diverticulosis of intestine, part unspecified, without perforation or abscess without bleeding: Secondary | ICD-10-CM | POA: Diagnosis not present

## 2021-06-29 DIAGNOSIS — Z9181 History of falling: Secondary | ICD-10-CM | POA: Diagnosis not present

## 2021-06-29 DIAGNOSIS — E7800 Pure hypercholesterolemia, unspecified: Secondary | ICD-10-CM | POA: Diagnosis not present

## 2021-06-29 NOTE — Telephone Encounter (Signed)
Shelia called from wound center and she spoke to [place] the grandson and he stated that there is no open wounds on the pt at this time, but he would like to keep the referral open  Alene Bergerson,cma

## 2021-06-29 NOTE — Telephone Encounter (Signed)
Shelia called from wound center and she spoke to [place] the grandson and he stated that there is no open wounds on the pt at this time, but he would like to keep the referral open

## 2021-06-29 NOTE — Telephone Encounter (Signed)
Noted  

## 2021-07-02 ENCOUNTER — Other Ambulatory Visit: Payer: Medicare Other

## 2021-07-02 DIAGNOSIS — I5032 Chronic diastolic (congestive) heart failure: Secondary | ICD-10-CM | POA: Diagnosis not present

## 2021-07-02 DIAGNOSIS — I482 Chronic atrial fibrillation, unspecified: Secondary | ICD-10-CM | POA: Diagnosis not present

## 2021-07-02 DIAGNOSIS — S42202D Unspecified fracture of upper end of left humerus, subsequent encounter for fracture with routine healing: Secondary | ICD-10-CM | POA: Diagnosis not present

## 2021-07-02 DIAGNOSIS — S01511D Laceration without foreign body of lip, subsequent encounter: Secondary | ICD-10-CM | POA: Diagnosis not present

## 2021-07-02 DIAGNOSIS — S0990XD Unspecified injury of head, subsequent encounter: Secondary | ICD-10-CM | POA: Diagnosis not present

## 2021-07-02 DIAGNOSIS — D62 Acute posthemorrhagic anemia: Secondary | ICD-10-CM | POA: Diagnosis not present

## 2021-07-04 ENCOUNTER — Other Ambulatory Visit: Payer: Medicare Other

## 2021-07-04 DIAGNOSIS — I482 Chronic atrial fibrillation, unspecified: Secondary | ICD-10-CM | POA: Diagnosis not present

## 2021-07-04 DIAGNOSIS — S42202D Unspecified fracture of upper end of left humerus, subsequent encounter for fracture with routine healing: Secondary | ICD-10-CM | POA: Diagnosis not present

## 2021-07-04 DIAGNOSIS — S01511D Laceration without foreign body of lip, subsequent encounter: Secondary | ICD-10-CM | POA: Diagnosis not present

## 2021-07-04 DIAGNOSIS — S0990XD Unspecified injury of head, subsequent encounter: Secondary | ICD-10-CM | POA: Diagnosis not present

## 2021-07-04 DIAGNOSIS — D62 Acute posthemorrhagic anemia: Secondary | ICD-10-CM | POA: Diagnosis not present

## 2021-07-04 DIAGNOSIS — I5032 Chronic diastolic (congestive) heart failure: Secondary | ICD-10-CM | POA: Diagnosis not present

## 2021-07-05 ENCOUNTER — Other Ambulatory Visit: Payer: Self-pay

## 2021-07-05 DIAGNOSIS — E43 Unspecified severe protein-calorie malnutrition: Secondary | ICD-10-CM

## 2021-07-05 DIAGNOSIS — D649 Anemia, unspecified: Secondary | ICD-10-CM

## 2021-07-05 DIAGNOSIS — D62 Acute posthemorrhagic anemia: Secondary | ICD-10-CM | POA: Diagnosis not present

## 2021-07-05 DIAGNOSIS — S01511D Laceration without foreign body of lip, subsequent encounter: Secondary | ICD-10-CM | POA: Diagnosis not present

## 2021-07-05 DIAGNOSIS — I5032 Chronic diastolic (congestive) heart failure: Secondary | ICD-10-CM | POA: Diagnosis not present

## 2021-07-05 DIAGNOSIS — I482 Chronic atrial fibrillation, unspecified: Secondary | ICD-10-CM | POA: Diagnosis not present

## 2021-07-05 DIAGNOSIS — S42202D Unspecified fracture of upper end of left humerus, subsequent encounter for fracture with routine healing: Secondary | ICD-10-CM | POA: Diagnosis not present

## 2021-07-05 DIAGNOSIS — S0990XD Unspecified injury of head, subsequent encounter: Secondary | ICD-10-CM | POA: Diagnosis not present

## 2021-07-05 NOTE — Progress Notes (Signed)
PCP, Laverna Peace, FNP sent fax request to have additional labs drawn at pt's upcoming lab appt on 2/6.  Had to cancel and reorder labs, as our office would not be able to collect the labs under their order as they placed the resulting agency as "[facility]".   Orders need results agency as "[facility]" in order to link the lab rec and be able to collect the specimens.   New orders placed and link to pt's appt at this time. Results will be forwarded to Laverna Peace, FNP

## 2021-07-06 DIAGNOSIS — S01511D Laceration without foreign body of lip, subsequent encounter: Secondary | ICD-10-CM | POA: Diagnosis not present

## 2021-07-06 DIAGNOSIS — D62 Acute posthemorrhagic anemia: Secondary | ICD-10-CM | POA: Diagnosis not present

## 2021-07-06 DIAGNOSIS — S42202D Unspecified fracture of upper end of left humerus, subsequent encounter for fracture with routine healing: Secondary | ICD-10-CM | POA: Diagnosis not present

## 2021-07-06 DIAGNOSIS — S0990XD Unspecified injury of head, subsequent encounter: Secondary | ICD-10-CM | POA: Diagnosis not present

## 2021-07-06 DIAGNOSIS — I5032 Chronic diastolic (congestive) heart failure: Secondary | ICD-10-CM | POA: Diagnosis not present

## 2021-07-06 DIAGNOSIS — I482 Chronic atrial fibrillation, unspecified: Secondary | ICD-10-CM | POA: Diagnosis not present

## 2021-07-09 ENCOUNTER — Other Ambulatory Visit (INDEPENDENT_AMBULATORY_CARE_PROVIDER_SITE_OTHER): Payer: Medicare Other

## 2021-07-09 ENCOUNTER — Telehealth: Payer: Self-pay

## 2021-07-09 ENCOUNTER — Other Ambulatory Visit: Payer: Self-pay

## 2021-07-09 DIAGNOSIS — E43 Unspecified severe protein-calorie malnutrition: Secondary | ICD-10-CM | POA: Diagnosis not present

## 2021-07-09 DIAGNOSIS — E038 Other specified hypothyroidism: Secondary | ICD-10-CM

## 2021-07-09 DIAGNOSIS — E559 Vitamin D deficiency, unspecified: Secondary | ICD-10-CM | POA: Diagnosis not present

## 2021-07-09 DIAGNOSIS — D649 Anemia, unspecified: Secondary | ICD-10-CM

## 2021-07-09 DIAGNOSIS — R7401 Elevation of levels of liver transaminase levels: Secondary | ICD-10-CM

## 2021-07-09 DIAGNOSIS — R7989 Other specified abnormal findings of blood chemistry: Secondary | ICD-10-CM

## 2021-07-09 DIAGNOSIS — I5032 Chronic diastolic (congestive) heart failure: Secondary | ICD-10-CM | POA: Diagnosis not present

## 2021-07-09 DIAGNOSIS — Z9229 Personal history of other drug therapy: Secondary | ICD-10-CM

## 2021-07-09 DIAGNOSIS — S42202D Unspecified fracture of upper end of left humerus, subsequent encounter for fracture with routine healing: Secondary | ICD-10-CM | POA: Diagnosis not present

## 2021-07-09 DIAGNOSIS — D62 Acute posthemorrhagic anemia: Secondary | ICD-10-CM | POA: Diagnosis not present

## 2021-07-09 DIAGNOSIS — S0990XD Unspecified injury of head, subsequent encounter: Secondary | ICD-10-CM | POA: Diagnosis not present

## 2021-07-09 DIAGNOSIS — S01511D Laceration without foreign body of lip, subsequent encounter: Secondary | ICD-10-CM | POA: Diagnosis not present

## 2021-07-09 DIAGNOSIS — I482 Chronic atrial fibrillation, unspecified: Secondary | ICD-10-CM | POA: Diagnosis not present

## 2021-07-09 NOTE — Telephone Encounter (Signed)
Patient came in for lab draw today. The two family members who accompanied her today are requesting to have a Vitamin D level added to the order because her previous level was elevated. He states he has been holding her Vitamin D supplement so he would like to see where he level is at now. Please advise/order if appropriate. Thank you!

## 2021-07-09 NOTE — Addendum Note (Signed)
Addended by: Raelene Bott, Juneau Doughman L on: 07/09/2021 12:35 PM   Modules accepted: Orders

## 2021-07-09 NOTE — Telephone Encounter (Signed)
Received one time verbal to speak with Hulen Skains who also accompanied patient to her appointment today. Advised him that Laverna Peace, NP did give verbal order to add Vitamin D level onto lab work that was drawn in our office today as he requested. Informed him that Sharyn Lull also requested that patient/caregiver call to make an appointment with Dr. Caryl Bis to follow-up with chronic issues/discuss labs.

## 2021-07-09 NOTE — Progress Notes (Signed)
vitamin

## 2021-07-09 NOTE — Addendum Note (Signed)
Addended by: Raelene Bott, Arial Galligan L on: 07/09/2021 12:34 PM   Modules accepted: Orders

## 2021-07-09 NOTE — Addendum Note (Signed)
Addended by: Raelene Bott, Cheryllynn Sarff L on: 07/09/2021 12:28 PM   Modules accepted: Orders

## 2021-07-10 LAB — IRON,TIBC AND FERRITIN PANEL
Ferritin: 282 ng/mL — ABNORMAL HIGH (ref 15–150)
Iron Saturation: 26 % (ref 15–55)
Iron: 99 ug/dL (ref 27–139)
Total Iron Binding Capacity: 380 ug/dL (ref 250–450)
UIBC: 281 ug/dL (ref 118–369)

## 2021-07-10 LAB — VITAMIN D 25 HYDROXY (VIT D DEFICIENCY, FRACTURES): Vit D, 25-Hydroxy: 206.8 ng/mL — ABNORMAL HIGH (ref 30.0–100.0)

## 2021-07-10 LAB — HEPATIC FUNCTION PANEL
ALT: 54 IU/L — ABNORMAL HIGH (ref 0–32)
AST: 43 IU/L — ABNORMAL HIGH (ref 0–40)
Albumin: 4.2 g/dL (ref 3.5–4.6)
Alkaline Phosphatase: 96 IU/L (ref 44–121)
Bilirubin Total: 0.3 mg/dL (ref 0.0–1.2)
Bilirubin, Direct: 0.15 mg/dL (ref 0.00–0.40)
Total Protein: 7.2 g/dL (ref 6.0–8.5)

## 2021-07-10 LAB — B12 AND FOLATE PANEL
Folate: 14.1 ng/mL (ref >3.0)
Vitamin B-12: >2000 pg/mL — ABNORMAL HIGH (ref 232–1245)

## 2021-07-10 NOTE — Progress Notes (Signed)
Vitamin D is still elevated, should hold all vitamin D supplements.

## 2021-07-10 NOTE — Progress Notes (Signed)
Advise to hold B12 until appointment with Kylie Haven, MD this month.  Ferritin is elevated, will discuss at follow up could be elevated given history of recent blood transfusion. She should keep follow up with PCP.  Vitamin D requested is still pending result.

## 2021-07-11 DIAGNOSIS — S42202D Unspecified fracture of upper end of left humerus, subsequent encounter for fracture with routine healing: Secondary | ICD-10-CM | POA: Diagnosis not present

## 2021-07-11 DIAGNOSIS — D62 Acute posthemorrhagic anemia: Secondary | ICD-10-CM | POA: Diagnosis not present

## 2021-07-11 DIAGNOSIS — S0990XD Unspecified injury of head, subsequent encounter: Secondary | ICD-10-CM | POA: Diagnosis not present

## 2021-07-11 DIAGNOSIS — I482 Chronic atrial fibrillation, unspecified: Secondary | ICD-10-CM | POA: Diagnosis not present

## 2021-07-11 DIAGNOSIS — I5032 Chronic diastolic (congestive) heart failure: Secondary | ICD-10-CM | POA: Diagnosis not present

## 2021-07-11 DIAGNOSIS — S01511D Laceration without foreign body of lip, subsequent encounter: Secondary | ICD-10-CM | POA: Diagnosis not present

## 2021-07-12 DIAGNOSIS — D62 Acute posthemorrhagic anemia: Secondary | ICD-10-CM | POA: Diagnosis not present

## 2021-07-12 DIAGNOSIS — S0990XD Unspecified injury of head, subsequent encounter: Secondary | ICD-10-CM | POA: Diagnosis not present

## 2021-07-12 DIAGNOSIS — I5032 Chronic diastolic (congestive) heart failure: Secondary | ICD-10-CM | POA: Diagnosis not present

## 2021-07-12 DIAGNOSIS — I482 Chronic atrial fibrillation, unspecified: Secondary | ICD-10-CM | POA: Diagnosis not present

## 2021-07-12 DIAGNOSIS — S01511D Laceration without foreign body of lip, subsequent encounter: Secondary | ICD-10-CM | POA: Diagnosis not present

## 2021-07-12 DIAGNOSIS — S42202D Unspecified fracture of upper end of left humerus, subsequent encounter for fracture with routine healing: Secondary | ICD-10-CM | POA: Diagnosis not present

## 2021-07-13 DIAGNOSIS — S0990XD Unspecified injury of head, subsequent encounter: Secondary | ICD-10-CM | POA: Diagnosis not present

## 2021-07-13 DIAGNOSIS — I5032 Chronic diastolic (congestive) heart failure: Secondary | ICD-10-CM | POA: Diagnosis not present

## 2021-07-13 DIAGNOSIS — S01511D Laceration without foreign body of lip, subsequent encounter: Secondary | ICD-10-CM | POA: Diagnosis not present

## 2021-07-13 DIAGNOSIS — D62 Acute posthemorrhagic anemia: Secondary | ICD-10-CM | POA: Diagnosis not present

## 2021-07-13 DIAGNOSIS — I482 Chronic atrial fibrillation, unspecified: Secondary | ICD-10-CM | POA: Diagnosis not present

## 2021-07-13 DIAGNOSIS — S42202D Unspecified fracture of upper end of left humerus, subsequent encounter for fracture with routine healing: Secondary | ICD-10-CM | POA: Diagnosis not present

## 2021-07-17 ENCOUNTER — Other Ambulatory Visit: Payer: Self-pay

## 2021-07-17 ENCOUNTER — Encounter: Payer: Self-pay | Admitting: Gastroenterology

## 2021-07-17 ENCOUNTER — Ambulatory Visit (INDEPENDENT_AMBULATORY_CARE_PROVIDER_SITE_OTHER): Payer: Medicare Other | Admitting: Gastroenterology

## 2021-07-17 VITALS — BP 138/77 | HR 77 | Temp 98.6°F

## 2021-07-17 DIAGNOSIS — R198 Other specified symptoms and signs involving the digestive system and abdomen: Secondary | ICD-10-CM | POA: Diagnosis not present

## 2021-07-17 DIAGNOSIS — E43 Unspecified severe protein-calorie malnutrition: Secondary | ICD-10-CM

## 2021-07-17 DIAGNOSIS — K642 Third degree hemorrhoids: Secondary | ICD-10-CM | POA: Diagnosis not present

## 2021-07-17 DIAGNOSIS — K449 Diaphragmatic hernia without obstruction or gangrene: Secondary | ICD-10-CM

## 2021-07-17 DIAGNOSIS — R140 Abdominal distension (gaseous): Secondary | ICD-10-CM | POA: Diagnosis not present

## 2021-07-17 DIAGNOSIS — K219 Gastro-esophageal reflux disease without esophagitis: Secondary | ICD-10-CM

## 2021-07-17 MED ORDER — HYDROCORTISONE ACETATE 25 MG RE SUPP: 25.0000 mg | Freq: Two times a day (BID) | RECTAL | 1 refills | Status: DC | PRN

## 2021-07-17 MED ORDER — OLANZAPINE 2.5 MG PO TABS: 2.5000 mg | ORAL_TABLET | Freq: Every day | ORAL | 0 refills | Status: DC

## 2021-07-17 NOTE — Progress Notes (Signed)
Cephas Darby, MD [address]  [place], [place] [address]  Main: [phone]  Fax: [phone]    Gastroenterology Consultation  Referring Provider:     [name], MD Primary Care Physician:  [name], MD Primary Gastroenterologist:  Dr. Cephas Darby Reason for Consultation:     Abdominal discomfort, abdominal bloating, constipation, regurgitation        HPI:   Kylie May is a 88 y.o. female referred by Dr. Caryl Bis, Angela Adam, MD  for consultation & management of constellation of GI symptoms including abdominal bloating, irregular bowel habits, reflux, regurgitation, possible trouble swallowing and abdominal discomfort.  Patient had a history of acute cholecystitis and choledocholithiasis complicated by severe sepsis and septic shock in 08/2018, underwent emergent cholecystostomy tube placement by IR and subsequently ERCP with biliary sphincterotomy and stone extraction, pancreatic duct stent placement after resolution of shock.  Patient was also consulted by general surgery at [facility] and [facility].  Patient was seen by the surgeon, Dr. Nadeen Landau at [facility] as outpatient during a tele-visit who did not recommend cholecystectomy due to her age and recommended removal of cholecystostomy tube.  Patient had pancreatic duct stent removed by me in 11/2018.  She also had seek consultation by the surgeon at [facility] who did not recommend cholecystectomy given her age and comorbidities.  She was recommended again either to keep the drain or remove the drain.  She also has a history of medium size hiatal hernia  Patient had a fall at her home in 05/2021, has been wheelchair-bound since then, has been slowly recuperating, appetite has been slowly making.  Patient has significant hearing deficit.  Patient is accompanied by her grandson who lives with her as well as her daughter.  According to them, she has been constipated for which she is given Colace  3 pills daily, she reports a lot of gas, bloating and discomfort.  Her family members expressed that every time she eats, she has some reflux of yellow gastric contents.  She is currently on omeprazole 40 mg once a day.  She does have medium sized hiatal hernia based on EGD in 2020.  She also had prolapsed hemorrhoids for which been applying hydrocortisone cream with some improvement.  Patient is not complaining of any rectal pain or discomfort.  She is managing to eat very small amounts of soft food.  Currently, she is totally dependent on her ADLs.  She has home health services once a week only.  Family members provide her care.  Patient's son also wants to know if we can try medication to help improve appetite.  Patient is has been taking sodium tablets twice daily since December.   NSAIDs: None  Antiplts/Anticoagulants/Anti thrombotics: Eliquis for history of A-fib  GI Procedures:  Upper endoscopy 11/16/2018 - Normal ampulla and second portion of the duodenum. - Non-bleeding duodenal diverticulum. - 2 cm hiatal hernia. - Normal stomach. - Normal gastroesophageal junction and esophagus. - No specimens collected.  Past Medical History:  Diagnosis Date   A-fib ([place])    Acid reflux disease    Arrhythmia    Atrial fibrillation with RVR ([place]) 08/15/2018   Chronic diastolic CHF (congestive heart failure) (H[facility])    Compression fracture of lumbar vertebra ([place])    Diverticulitis 01-2008   [facility]   Diverticulosis    Esophageal stricture 2006   Femoral bruit    Right   Fibrocystic breast disease  Hiatal hernia    History of esophageal stricture 06/14/2004   2006 esophageal dilation Schatzki's ring    History of skin cancer 12/29/2008   Basal cell cancer right glabella 07/10/2010 Dr. Evorn Gong, [facility] [place] , [place]    Hypercholesterolemia    [place] study LDL goal = < 160   Mitral regurgitation    a. 06/2014 EF 55-60%, elevated end-diastolic pressures, dilated LA at 4.3 cm, mildly  dilated RA, severe mitral regurgitation, mild aortic sclerosis without stenosis, mod-severe TR   Mitral valve prolapse    Osteoporosis    Dr Matthew Saras   Pancreatitis 07-2008   Hospitalized    Patella fracture 05/13/2018   Schatzki's ring    Sepsis ([place]) 08/15/2018   Skin cancer    facial x 2. [name]    Past Surgical History:  Procedure Laterality Date   BILIARY STENT PLACEMENT  08/21/2018   Procedure: BILIARY STENT PLACEMENT;  Surgeon: [name], MD;  Location: [facility] ENDOSCOPY;  Service: Gastroenterology;;   COLONOSCOPY  2006   Epidural steroids     x1   ERCP N/A 08/21/2018   Procedure: ENDOSCOPIC RETROGRADE CHOLANGIOPANCREATOGRAPHY (ERCP);  Surgeon: [name], MD;  Location: [place];  Service: Gastroenterology;  Laterality: N/A;   ESOPHAGEAL DILATION  2006   ESOPHAGOGASTRODUODENOSCOPY (EGD) WITH PROPOFOL N/A 11/16/2018   Procedure: ESOPHAGOGASTRODUODENOSCOPY (EGD) WITH PROPOFOL;  Surgeon: [name], MD;  Location: [facility] ENDOSCOPY;  Service: Gastroenterology;  Laterality: N/A;   G3 P4 (twins)     IR CHOLANGIOGRAM EXISTING TUBE  08/18/2018   IR EXCHANGE BILIARY DRAIN  11/03/2018   IR EXCHANGE BILIARY DRAIN  12/01/2018   IR EXCHANGE BILIARY DRAIN  05/13/2019   IR EXCHANGE BILIARY DRAIN  06/14/2019   IR EXCHANGE BILIARY DRAIN  07/19/2019   IR EXCHANGE BILIARY DRAIN  08/17/2019   IR EXCHANGE BILIARY DRAIN  09/30/2019   IR EXCHANGE BILIARY DRAIN  12/08/2019   IR EXCHANGE BILIARY DRAIN  01/19/2020   IR EXCHANGE BILIARY DRAIN  03/01/2020   IR EXCHANGE BILIARY DRAIN  04/20/2020   IR EXCHANGE BILIARY DRAIN  07/06/2020   IR EXCHANGE BILIARY DRAIN  10/26/2020   IR EXCHANGE BILIARY DRAIN  01/18/2021   IR EXCHANGE BILIARY DRAIN  03/05/2021   IR EXCHANGE BILIARY DRAIN  05/25/2021   IR PERC CHOLECYSTOSTOMY  08/16/2018   REMOVAL OF STONES  08/21/2018   Procedure: REMOVAL OF STONES;  Surgeon: [name], MD;  Location: [place];  Service: Gastroenterology;;   SKIN CANCER EXCISION      SPHINCTEROTOMY  08/21/2018   Procedure: SPHINCTEROTOMY;  Surgeon: [name], MD;  Location: [place];  Service: Gastroenterology;;    Current Outpatient Medications:    acetaminophen (TYLENOL) 325 MG tablet, Take 2 tablets (650 mg total) by mouth every 6 (six) hours as needed for mild pain (or Fever >/= 101)., Disp: , Rfl:    amiodarone (PACERONE) 200 MG tablet, Take 1 tablet (200 mg total) by mouth daily., Disp: 90 tablet, Rfl: 3   apixaban (ELIQUIS) 2.5 MG TABS tablet, Take 1 tablet (2.5 mg total) by mouth 2 (two) times daily. Pt is overdue for follow-up, MUST see MD for FUTURE refills., Disp: 180 tablet, Rfl: 3   benzonatate (TESSALON) 200 MG capsule, Take 1 capsule (200 mg total) by mouth 2 (two) times daily as needed for cough., Disp: 20 capsule, Rfl: 0   ferrous sulfate 325 (65 FE) MG EC tablet, Take 325 mg by mouth daily., Disp: , Rfl:    fluticasone (  FLONASE) 50 MCG/ACT nasal spray, USE 2 SPRAYS IN EACH NOSTRIL DAILY, Disp: 16 g, Rfl: 11   hydrocortisone (ANUSOL-HC) 2.5 % rectal cream, Place 1 application rectally 2 (two) times daily., Disp: 30 g, Rfl: 0   hydrocortisone (ANUSOL-HC) 25 MG suppository, Place 1 suppository (25 mg total) rectally 2 (two) times daily as needed for hemorrhoids or anal itching., Disp: 14 suppository, Rfl: 1   levothyroxine (SYNTHROID) 50 MCG tablet, Take 1 tablet (50 mcg total) by mouth daily., Disp: 90 tablet, Rfl: 3   magnesium chloride (SLOW-MAG) 64 MG TBEC SR tablet, Take 2 tablets (128 mg total) by mouth daily., Disp: 60 tablet, Rfl: 3   metoprolol tartrate (LOPRESSOR) 25 MG tablet, Take 1 tablet (25 mg total) by mouth 2 (two) times daily., Disp: 180 tablet, Rfl: 3   nystatin cream (MYCOSTATIN), nystatin 100,000 unit/gram topical cream  APPLY TOPICALLY TO THE AFFECTED AREA TWICE DAILY, Disp: , Rfl:    OLANZapine (ZYPREXA) 2.5 MG tablet, Take 1 tablet (2.5 mg total) by mouth at bedtime., Disp: 30 tablet, Rfl: 0   omeprazole (PRILOSEC) 40 MG capsule, TAKE  1 CAPSULE DAILY, Disp: 90 capsule, Rfl: 3   rosuvastatin (CRESTOR) 20 MG tablet, Take 1 tablet (20 mg total) by mouth at bedtime., Disp: 90 tablet, Rfl: 3   sodium chloride 1 g tablet, Take 1 tablet (1 g total) by mouth 2 (two) times daily with a meal., Disp: 60 tablet, Rfl: 0   Sodium Chloride Flush (NORMAL SALINE FLUSH) 0.9 % SOLN, FLUSH ONCE DAILY AS DIRECTED, Disp: 900 mL, Rfl: 1   traMADol (ULTRAM) 50 MG tablet, Take 50 mg by mouth every 6 (six) hours as needed., Disp: , Rfl:     Family History  Problem Relation Age of Onset   Breast cancer Maternal Aunt    Hepatitis Daughter        C   Lung disease Mother        lung tumor   Heart attack Daughter 20       S/P stents   Liver cancer Daughter    Heart attack Sister 42   Diabetes Neg Hx    Stroke Neg Hx      Social History   Tobacco Use   Smoking status: Former    Types: Cigarettes    Quit date: 06/04/1951    Years since quitting: 70.1   Smokeless tobacco: Never   Tobacco comments:    Smoked 1945-1953 , up to 2 cigarettes/day  Vaping Use   Vaping Use: Never used  Substance Use Topics   Alcohol use: Yes    Alcohol/week: 1.0 standard drink    Types: 1 Glasses of wine per week    Comment: occassional   Drug use: No    Allergies as of 07/17/2021 - Review Complete 07/17/2021  Allergen Reaction Noted   Metronidazole  11/11/2006   Penicillin g Rash 08/11/2015    Review of Systems:    All systems reviewed and negative except where noted in HPI.   Physical Exam:  BP 138/77 (BP Location: Left Arm, Patient Position: Sitting, Cuff Size: Normal)    Pulse 77    Temp 98.6 F (37 C) (Oral)  No LMP recorded. Patient is postmenopausal.  General:   Alert, thin built, poorly nourished, pleasant and cooperative in NAD Head:  Normocephalic and atraumatic, bitemporal wasting. Eyes:  Sclera clear, no icterus.   Conjunctiva pink. Ears: Decreased auditory acuity. Nose:  No deformity, discharge, or lesions. Mouth:  No deformity  or  lesions,oropharynx pink & moist. Neck:  Supple; no masses or thyromegaly. Lungs:  Respirations even and unlabored.  Clear throughout to auscultation.   No wheezes, crackles, or rhonchi. No acute distress. Heart:  Regular rate and rhythm; no murmurs, clicks, rubs, or gallops. Abdomen:  Normal bowel sounds.  Cholecystostomy drain in place, soft, non-tender and mildly distended without masses, hepatosplenomegaly or hernias noted.  No guarding or rebound tenderness.   Rectal: Not performed Msk: Limited mobility, generalized muscle wasting Pulses:  Normal pulses noted. Extremities:  No clubbing, mild edema.  No cyanosis. Neurologic:  Alert and oriented x2 Skin:  Intact without significant lesions or rashes. No jaundice. Psych:  Alert and cooperative. Normal mood and affect.  Imaging Studies: Reviewed  Assessment and Plan:   Kylie May is a 88 y.o. female with history of A-fib on Eliquis, medium size hiatal hernia, significant hearing loss with history of acute calculus cholecystitis, recurrent choledocholithiasis, complicated by ascending cholangitis, septic shock, emergent cholecystostomy drain [facility] in 08/2018 [facility], followed by ERCP with biliary sphincterotomy and stone extraction, PD stent placement, s/p removal in 11/2018, cholecystostomy drain in place, had COVID in 05/2021, s/p fall in 05/2021 resulting in significant impairment in mobility, wheelchair-bound is seen in consultation for irregular bowel habits, regurgitation, abdominal bloating, symptomatic hemorrhoids, failure to thrive  Regurgitation, reflux in setting of medium size hiatal hernia Continue omeprazole 40 mg daily Start Pepcid 20 mg at bedtime Small portion meals Do not recommend repeat EGD at this time  Irregular bowel habits with abdominal bloating and discomfort Stop Colace Start MiraLAX 17 g or half the dose daily  Symptomatic grade 3 hemorrhoids: Fairly controlled Responding to Anusol  cream Trial of Anusol suppository if develops another flareup Do not recommend hemorrhoid ligation or hemorrhoidectomy given her age and comorbidities  Severe protein calorie malnutrition Trial of Zyprexa 2.5 mg at bedtime for 1 month Discussed about high-protein diet, small frequent meals   Follow up as needed  Cephas Darby, MD

## 2021-07-18 DIAGNOSIS — S0990XD Unspecified injury of head, subsequent encounter: Secondary | ICD-10-CM | POA: Diagnosis not present

## 2021-07-18 DIAGNOSIS — I5032 Chronic diastolic (congestive) heart failure: Secondary | ICD-10-CM | POA: Diagnosis not present

## 2021-07-18 DIAGNOSIS — S01511D Laceration without foreign body of lip, subsequent encounter: Secondary | ICD-10-CM | POA: Diagnosis not present

## 2021-07-18 DIAGNOSIS — I482 Chronic atrial fibrillation, unspecified: Secondary | ICD-10-CM | POA: Diagnosis not present

## 2021-07-18 DIAGNOSIS — D62 Acute posthemorrhagic anemia: Secondary | ICD-10-CM | POA: Diagnosis not present

## 2021-07-18 DIAGNOSIS — S42202D Unspecified fracture of upper end of left humerus, subsequent encounter for fracture with routine healing: Secondary | ICD-10-CM | POA: Diagnosis not present

## 2021-07-19 DIAGNOSIS — I482 Chronic atrial fibrillation, unspecified: Secondary | ICD-10-CM | POA: Diagnosis not present

## 2021-07-19 DIAGNOSIS — I5032 Chronic diastolic (congestive) heart failure: Secondary | ICD-10-CM | POA: Diagnosis not present

## 2021-07-19 DIAGNOSIS — S0990XD Unspecified injury of head, subsequent encounter: Secondary | ICD-10-CM | POA: Diagnosis not present

## 2021-07-19 DIAGNOSIS — S01511D Laceration without foreign body of lip, subsequent encounter: Secondary | ICD-10-CM | POA: Diagnosis not present

## 2021-07-19 DIAGNOSIS — D62 Acute posthemorrhagic anemia: Secondary | ICD-10-CM | POA: Diagnosis not present

## 2021-07-19 DIAGNOSIS — S42202D Unspecified fracture of upper end of left humerus, subsequent encounter for fracture with routine healing: Secondary | ICD-10-CM | POA: Diagnosis not present

## 2021-07-20 DIAGNOSIS — S01511D Laceration without foreign body of lip, subsequent encounter: Secondary | ICD-10-CM | POA: Diagnosis not present

## 2021-07-20 DIAGNOSIS — I482 Chronic atrial fibrillation, unspecified: Secondary | ICD-10-CM | POA: Diagnosis not present

## 2021-07-20 DIAGNOSIS — D62 Acute posthemorrhagic anemia: Secondary | ICD-10-CM | POA: Diagnosis not present

## 2021-07-20 DIAGNOSIS — S42202D Unspecified fracture of upper end of left humerus, subsequent encounter for fracture with routine healing: Secondary | ICD-10-CM | POA: Diagnosis not present

## 2021-07-20 DIAGNOSIS — S0990XD Unspecified injury of head, subsequent encounter: Secondary | ICD-10-CM | POA: Diagnosis not present

## 2021-07-20 DIAGNOSIS — I5032 Chronic diastolic (congestive) heart failure: Secondary | ICD-10-CM | POA: Diagnosis not present

## 2021-07-23 DIAGNOSIS — S42202D Unspecified fracture of upper end of left humerus, subsequent encounter for fracture with routine healing: Secondary | ICD-10-CM | POA: Diagnosis not present

## 2021-07-23 DIAGNOSIS — I482 Chronic atrial fibrillation, unspecified: Secondary | ICD-10-CM | POA: Diagnosis not present

## 2021-07-23 DIAGNOSIS — S42202A Unspecified fracture of upper end of left humerus, initial encounter for closed fracture: Secondary | ICD-10-CM | POA: Diagnosis not present

## 2021-07-23 DIAGNOSIS — S52502A Unspecified fracture of the lower end of left radius, initial encounter for closed fracture: Secondary | ICD-10-CM | POA: Diagnosis not present

## 2021-07-23 DIAGNOSIS — D62 Acute posthemorrhagic anemia: Secondary | ICD-10-CM | POA: Diagnosis not present

## 2021-07-23 DIAGNOSIS — S0990XD Unspecified injury of head, subsequent encounter: Secondary | ICD-10-CM | POA: Diagnosis not present

## 2021-07-23 DIAGNOSIS — S62641A Nondisplaced fracture of proximal phalanx of left index finger, initial encounter for closed fracture: Secondary | ICD-10-CM | POA: Diagnosis not present

## 2021-07-23 DIAGNOSIS — S01511D Laceration without foreign body of lip, subsequent encounter: Secondary | ICD-10-CM | POA: Diagnosis not present

## 2021-07-23 DIAGNOSIS — I5032 Chronic diastolic (congestive) heart failure: Secondary | ICD-10-CM | POA: Diagnosis not present

## 2021-07-24 DIAGNOSIS — S0990XD Unspecified injury of head, subsequent encounter: Secondary | ICD-10-CM | POA: Diagnosis not present

## 2021-07-24 DIAGNOSIS — S42202D Unspecified fracture of upper end of left humerus, subsequent encounter for fracture with routine healing: Secondary | ICD-10-CM | POA: Diagnosis not present

## 2021-07-24 DIAGNOSIS — S01511D Laceration without foreign body of lip, subsequent encounter: Secondary | ICD-10-CM | POA: Diagnosis not present

## 2021-07-24 DIAGNOSIS — I482 Chronic atrial fibrillation, unspecified: Secondary | ICD-10-CM | POA: Diagnosis not present

## 2021-07-24 DIAGNOSIS — I5032 Chronic diastolic (congestive) heart failure: Secondary | ICD-10-CM | POA: Diagnosis not present

## 2021-07-24 DIAGNOSIS — D62 Acute posthemorrhagic anemia: Secondary | ICD-10-CM | POA: Diagnosis not present

## 2021-07-25 DIAGNOSIS — S01511D Laceration without foreign body of lip, subsequent encounter: Secondary | ICD-10-CM | POA: Diagnosis not present

## 2021-07-25 DIAGNOSIS — S0990XD Unspecified injury of head, subsequent encounter: Secondary | ICD-10-CM | POA: Diagnosis not present

## 2021-07-25 DIAGNOSIS — S42202D Unspecified fracture of upper end of left humerus, subsequent encounter for fracture with routine healing: Secondary | ICD-10-CM | POA: Diagnosis not present

## 2021-07-25 DIAGNOSIS — D62 Acute posthemorrhagic anemia: Secondary | ICD-10-CM | POA: Diagnosis not present

## 2021-07-25 DIAGNOSIS — I5032 Chronic diastolic (congestive) heart failure: Secondary | ICD-10-CM | POA: Diagnosis not present

## 2021-07-25 DIAGNOSIS — I482 Chronic atrial fibrillation, unspecified: Secondary | ICD-10-CM | POA: Diagnosis not present

## 2021-07-26 ENCOUNTER — Telehealth: Payer: Self-pay | Admitting: Family Medicine

## 2021-07-26 DIAGNOSIS — I482 Chronic atrial fibrillation, unspecified: Secondary | ICD-10-CM | POA: Diagnosis not present

## 2021-07-26 DIAGNOSIS — S0990XD Unspecified injury of head, subsequent encounter: Secondary | ICD-10-CM | POA: Diagnosis not present

## 2021-07-26 DIAGNOSIS — I5032 Chronic diastolic (congestive) heart failure: Secondary | ICD-10-CM | POA: Diagnosis not present

## 2021-07-26 DIAGNOSIS — D62 Acute posthemorrhagic anemia: Secondary | ICD-10-CM | POA: Diagnosis not present

## 2021-07-26 DIAGNOSIS — S42202D Unspecified fracture of upper end of left humerus, subsequent encounter for fracture with routine healing: Secondary | ICD-10-CM | POA: Diagnosis not present

## 2021-07-26 DIAGNOSIS — S01511D Laceration without foreign body of lip, subsequent encounter: Secondary | ICD-10-CM | POA: Diagnosis not present

## 2021-07-26 NOTE — Telephone Encounter (Signed)
[place] called in requesting verbal order for Pt home health aid. Advise Colletta Maryland to fax orders over we are no longer taking verbal order.   Colletta Maryland is requesting verbal order for home health aid. 2x a week for 2 weeks and 1x a week for 2 weeks.

## 2021-07-27 ENCOUNTER — Encounter: Payer: Self-pay | Admitting: Family Medicine

## 2021-07-27 ENCOUNTER — Other Ambulatory Visit: Payer: Self-pay

## 2021-07-27 ENCOUNTER — Ambulatory Visit (INDEPENDENT_AMBULATORY_CARE_PROVIDER_SITE_OTHER): Payer: Medicare Other | Admitting: Family Medicine

## 2021-07-27 VITALS — BP 100/60 | HR 62 | Temp 98.5°F | Ht 60.0 in | Wt 96.8 lb

## 2021-07-27 DIAGNOSIS — E871 Hypo-osmolality and hyponatremia: Secondary | ICD-10-CM | POA: Diagnosis not present

## 2021-07-27 DIAGNOSIS — D649 Anemia, unspecified: Secondary | ICD-10-CM

## 2021-07-27 DIAGNOSIS — E673 Hypervitaminosis D: Secondary | ICD-10-CM

## 2021-07-27 DIAGNOSIS — R1310 Dysphagia, unspecified: Secondary | ICD-10-CM | POA: Diagnosis not present

## 2021-07-27 DIAGNOSIS — Z8719 Personal history of other diseases of the digestive system: Secondary | ICD-10-CM | POA: Diagnosis not present

## 2021-07-27 DIAGNOSIS — S42295D Other nondisplaced fracture of upper end of left humerus, subsequent encounter for fracture with routine healing: Secondary | ICD-10-CM | POA: Diagnosis not present

## 2021-07-27 DIAGNOSIS — R7989 Other specified abnormal findings of blood chemistry: Secondary | ICD-10-CM | POA: Diagnosis not present

## 2021-07-27 DIAGNOSIS — E43 Unspecified severe protein-calorie malnutrition: Secondary | ICD-10-CM | POA: Diagnosis not present

## 2021-07-27 LAB — COMPREHENSIVE METABOLIC PANEL
AG Ratio: 1.2 (calc) (ref 1.0–2.5)
ALT: 39 U/L — ABNORMAL HIGH (ref 6–29)
AST: 34 U/L (ref 10–35)
Albumin: 3.7 g/dL (ref 3.6–5.1)
Alkaline phosphatase (APISO): 74 U/L (ref 37–153)
BUN/Creatinine Ratio: 38 (calc) — ABNORMAL HIGH (ref 6–22)
BUN: 37 mg/dL — ABNORMAL HIGH (ref 7–25)
CO2: 25 mmol/L (ref 20–32)
Calcium: 10.3 mg/dL (ref 8.6–10.4)
Chloride: 103 mmol/L (ref 98–110)
Creat: 0.98 mg/dL — ABNORMAL HIGH (ref 0.60–0.95)
Globulin: 3 g/dL (calc) (ref 1.9–3.7)
Glucose, Bld: 100 mg/dL — ABNORMAL HIGH (ref 65–99)
Potassium: 4.7 mmol/L (ref 3.5–5.3)
Sodium: 137 mmol/L (ref 135–146)
Total Bilirubin: 0.4 mg/dL (ref 0.2–1.2)
Total Protein: 6.7 g/dL (ref 6.1–8.1)

## 2021-07-27 LAB — VITAMIN D 25 HYDROXY (VIT D DEFICIENCY, FRACTURES): Vit D, 25-Hydroxy: 145 ng/mL — ABNORMAL HIGH (ref 30–100)

## 2021-07-27 LAB — CBC
HCT: 38.9 % (ref 35.0–45.0)
Hemoglobin: 12.8 g/dL (ref 11.7–15.5)
MCH: 32.6 pg (ref 27.0–33.0)
MCHC: 32.9 g/dL (ref 32.0–36.0)
MCV: 99 fL (ref 80.0–100.0)
MPV: 11.3 fL (ref 7.5–12.5)
Platelets: 281 10*3/uL (ref 140–400)
RBC: 3.93 10*6/uL (ref 3.80–5.10)
RDW: 12.2 % (ref 11.0–15.0)
WBC: 11.4 10*3/uL — ABNORMAL HIGH (ref 3.8–10.8)

## 2021-07-27 NOTE — Telephone Encounter (Signed)
I called Colletta Maryland and informed her that the provider is giving verbal orders for PT but she needed to fax the order also and she agreed to fax the order.  Marica Trentham,cma

## 2021-07-27 NOTE — Telephone Encounter (Signed)
Verbal orders can be given though we do need orders faxed to be signed.

## 2021-07-27 NOTE — Patient Instructions (Signed)
Nice to see you. Please eat whatever you want. Please get two protein shakes per day.  We will contact you with the lab results.  Please see a dentist.

## 2021-07-29 DIAGNOSIS — E7800 Pure hypercholesterolemia, unspecified: Secondary | ICD-10-CM | POA: Diagnosis not present

## 2021-07-29 DIAGNOSIS — Z7901 Long term (current) use of anticoagulants: Secondary | ICD-10-CM | POA: Diagnosis not present

## 2021-07-29 DIAGNOSIS — Z9181 History of falling: Secondary | ICD-10-CM | POA: Diagnosis not present

## 2021-07-29 DIAGNOSIS — M810 Age-related osteoporosis without current pathological fracture: Secondary | ICD-10-CM | POA: Diagnosis not present

## 2021-07-29 DIAGNOSIS — I482 Chronic atrial fibrillation, unspecified: Secondary | ICD-10-CM | POA: Diagnosis not present

## 2021-07-29 DIAGNOSIS — Z85828 Personal history of other malignant neoplasm of skin: Secondary | ICD-10-CM | POA: Diagnosis not present

## 2021-07-29 DIAGNOSIS — K219 Gastro-esophageal reflux disease without esophagitis: Secondary | ICD-10-CM | POA: Diagnosis not present

## 2021-07-29 DIAGNOSIS — Z434 Encounter for attention to other artificial openings of digestive tract: Secondary | ICD-10-CM | POA: Diagnosis not present

## 2021-07-29 DIAGNOSIS — H9190 Unspecified hearing loss, unspecified ear: Secondary | ICD-10-CM | POA: Diagnosis not present

## 2021-07-29 DIAGNOSIS — Z9049 Acquired absence of other specified parts of digestive tract: Secondary | ICD-10-CM | POA: Diagnosis not present

## 2021-07-29 DIAGNOSIS — D62 Acute posthemorrhagic anemia: Secondary | ICD-10-CM | POA: Diagnosis not present

## 2021-07-29 DIAGNOSIS — K5790 Diverticulosis of intestine, part unspecified, without perforation or abscess without bleeding: Secondary | ICD-10-CM | POA: Diagnosis not present

## 2021-07-29 DIAGNOSIS — Z79899 Other long term (current) drug therapy: Secondary | ICD-10-CM | POA: Diagnosis not present

## 2021-07-29 DIAGNOSIS — S42202D Unspecified fracture of upper end of left humerus, subsequent encounter for fracture with routine healing: Secondary | ICD-10-CM | POA: Diagnosis not present

## 2021-07-29 DIAGNOSIS — S52502D Unspecified fracture of the lower end of left radius, subsequent encounter for closed fracture with routine healing: Secondary | ICD-10-CM | POA: Diagnosis not present

## 2021-07-29 DIAGNOSIS — K449 Diaphragmatic hernia without obstruction or gangrene: Secondary | ICD-10-CM | POA: Diagnosis not present

## 2021-07-29 DIAGNOSIS — I5032 Chronic diastolic (congestive) heart failure: Secondary | ICD-10-CM | POA: Diagnosis not present

## 2021-07-29 DIAGNOSIS — S62601D Fracture of unspecified phalanx of left index finger, subsequent encounter for fracture with routine healing: Secondary | ICD-10-CM | POA: Diagnosis not present

## 2021-07-29 DIAGNOSIS — N6019 Diffuse cystic mastopathy of unspecified breast: Secondary | ICD-10-CM | POA: Diagnosis not present

## 2021-07-29 DIAGNOSIS — I081 Rheumatic disorders of both mitral and tricuspid valves: Secondary | ICD-10-CM | POA: Diagnosis not present

## 2021-07-30 DIAGNOSIS — Z20822 Contact with and (suspected) exposure to covid-19: Secondary | ICD-10-CM | POA: Diagnosis not present

## 2021-07-30 NOTE — Assessment & Plan Note (Signed)
She continues to have a drain in place.  There are no signs of infection.  They will follow-up with the specialist that is managing this.

## 2021-07-30 NOTE — Progress Notes (Addendum)
Tommi Rumps, MD Phone: [phone]  Kylie May is a 88 y.o. female who presents today for follow-up.  The patient's grandson and daughter gives most of the history as the patient is very hard of hearing.  Malnutrition: Patient saw GI.  They placed her on Zyprexa though the patient's grandson notes he did not start that after he looked up that it was an antipsychotic.  She has not been eating as much since she had her prior GI issues with issues swallowing and regurgitation.  GI noted an EGD was not indicated.  The grandson noted that they were told that the trouble swallowing was not related to an issue with esophageal narrowing.  She does occasionally have nausea.  She did have a fall previously where she broke several teeth.  She has not seen a dentist for that.  They try to get 1 protein shakes in a day.  The patient notes not much tastes good to her.  She notes no depression.  GI did recommend that she add Pepcid 20 mg nightly and continue omeprazole 40 mg daily to see if that would help with her reflux/regurgitation.  Humerus fracture: The patient had a fall in December that resulted in a closed fracture of the proximal end of the left humerus.  She has been following with orthopedics for this.  Her family members report that this seems to be healing relatively well and she is doing significantly better than she was after the fall.  They note orthopedics gave her the all clear to use as she is able to.  They note occupational therapy is coming to the house.  Hyponatremia: This was noted in the hospital.  She was discharged on salt tablets twice daily though they note she has not been using this recently.  Social History   Tobacco Use  Smoking Status Former   Types: Cigarettes   Quit date: 06/04/1951   Years since quitting: 70.2  Smokeless Tobacco Never  Tobacco Comments   Smoked 1945-1953 , up to 2 cigarettes/day    Current Outpatient Medications on File Prior to Visit   Medication Sig Dispense Refill   acetaminophen (TYLENOL) 325 MG tablet Take 2 tablets (650 mg total) by mouth every 6 (six) hours as needed for mild pain (or Fever >/= 101).     amiodarone (PACERONE) 200 MG tablet Take 1 tablet (200 mg total) by mouth daily. 90 tablet 3   apixaban (ELIQUIS) 2.5 MG TABS tablet Take 1 tablet (2.5 mg total) by mouth 2 (two) times daily. Pt is overdue for follow-up, MUST see MD for FUTURE refills. 180 tablet 3   ferrous sulfate 325 (65 FE) MG EC tablet Take 325 mg by mouth daily.     fluticasone (FLONASE) 50 MCG/ACT nasal spray USE 2 SPRAYS IN EACH NOSTRIL DAILY 16 g 11   hydrocortisone (ANUSOL-HC) 2.5 % rectal cream Place 1 application rectally 2 (two) times daily. 30 g 0   levothyroxine (SYNTHROID) 50 MCG tablet Take 1 tablet (50 mcg total) by mouth daily. 90 tablet 3   magnesium chloride (SLOW-MAG) 64 MG TBEC SR tablet Take 2 tablets (128 mg total) by mouth daily. 60 tablet 3   metoprolol tartrate (LOPRESSOR) 25 MG tablet Take 1 tablet (25 mg total) by mouth 2 (two) times daily. 180 tablet 3   nystatin cream (MYCOSTATIN) nystatin 100,000 unit/gram topical cream  APPLY TOPICALLY TO THE AFFECTED AREA TWICE DAILY     omeprazole (PRILOSEC) 40 MG capsule TAKE 1 CAPSULE DAILY  90 capsule 3   benzonatate (TESSALON) 200 MG capsule Take 1 capsule (200 mg total) by mouth 2 (two) times daily as needed for cough. (Patient not taking: Reported on 07/26/2021) 20 capsule 0   hydrocortisone (ANUSOL-HC) 25 MG suppository Place 1 suppository (25 mg total) rectally 2 (two) times daily as needed for hemorrhoids or anal itching. (Patient not taking: Reported on 07/26/2021) 14 suppository 1   OLANZapine (ZYPREXA) 2.5 MG tablet Take 1 tablet (2.5 mg total) by mouth at bedtime. 30 tablet 0   rosuvastatin (CRESTOR) 20 MG tablet Take 1 tablet (20 mg total) by mouth at bedtime. (Patient not taking: Reported on 07/26/2021) 90 tablet 3   sodium chloride 1 g tablet Take 1 tablet (1  g total) by mouth 2 (two) times daily with a meal. (Patient not taking: Reported on 07/26/2021) 60 tablet 0   Sodium Chloride Flush (NORMAL SALINE FLUSH) 0.9 % SOLN FLUSH ONCE DAILY AS DIRECTED (Patient not taking: Reported on 07/26/2021) 900 mL 1   traMADol (ULTRAM) 50 MG tablet Take 50 mg by mouth every 6 (six) hours as needed. (Patient not taking: Reported on 07/26/2021)     No current facility-administered medications on file prior to visit.     ROS see history of present illness  Objective  Physical Exam Vitals:   07/27/21 1359  BP: 100/60  Pulse: 62  Temp: 98.5 F (36.9 C)  SpO2: 92%    BP Readings from Last 3 Encounters:  07/27/21 100/60  07/17/21 138/77  06/26/21 130/82   Wt Readings from Last 3 Encounters:  07/27/21 96 lb 12.8 oz (43.9 kg)  06/26/21 103 lb (46.7 kg)  06/26/21 103 lb 9.6 oz (47 kg)    Physical Exam Constitutional:      General: She is not in acute distress.    Appearance: She is not diaphoretic.  HENT:     Mouth/Throat:     Comments: Numerous broken teeth Cardiovascular:     Rate and Rhythm: Normal rate and regular rhythm.     Heart sounds: Normal heart sounds.  Pulmonary:     Effort: Pulmonary effort is normal.     Breath sounds: Normal breath sounds.  Abdominal:     Comments: Right upper quadrant drain in place, difficult to evaluate given the bandage though there appears to be no surrounding erythema on the visible skin  Skin:    General: Skin is warm and dry.  Neurological:     Mental Status: She is alert.     Assessment/Plan: Please see individual problem list.  Problem List Items Addressed This Visit     Anemia   Relevant Orders   CBC (Completed)   Closed fracture of left proximal humerus    Reportedly healing well.  They will continue to follow the recommendations of orthopedics and continue to see occupational therapy.      Dysphagia - Primary    Discussed she should continue omeprazole 40 mg once daily.  She will  continue Pepcid 20 mg nightly.  We will get a swallow study to determine if there is any underlying issue that could be contributing to her reflux/regurgitation.      Relevant Orders   SLP modified barium swallow   Elevated LFTs   Relevant Orders   Comp Met (CMET) (Completed)   History of cholecystitis    She continues to have a drain in place.  There are no signs of infection.  They will follow-up with the specialist that is managing this.  Hyponatremia    Recheck sodium level.  Advised to defer any further salt tablets.      Protein-calorie malnutrition ([place])    Discussed that the patient needs to eat high calorie foods.  Discussed that I would not recommend Zyprexa for an appetite stimulant.  Discussed that the risk of the medication likely outweighs any benefit.  Discussed there were other medications that we could try to help increase her appetite though they all carry some risk.  These were deferred at this time.  They will get her to drink 2 protein shakes per day.  They will allow her to eat essentially what ever she wants to eat but is a high calorie food.      Other Visit Diagnoses     Hypervitaminosis D       Relevant Orders   Vitamin D (25 hydroxy) (Completed)       Return in about 6 weeks (around 09/07/2021) for weight.  This visit occurred during the SARS-CoV-2 public health emergency.  Safety protocols were in place, including screening questions prior to the visit, additional usage of staff PPE, and extensive cleaning of exam room while observing appropriate contact time as indicated for disinfecting solutions.   I have spent 33 minutes in the care of this patient on the day of her visit regarding History taking, review of recent GI note, placing orders, discussion of plan.   Tommi Rumps, MD [place]

## 2021-07-30 NOTE — Assessment & Plan Note (Signed)
Recheck sodium level.  Advised to defer any further salt tablets.

## 2021-07-30 NOTE — Assessment & Plan Note (Signed)
Reportedly healing well.  They will continue to follow the recommendations of orthopedics and continue to see occupational therapy.

## 2021-07-30 NOTE — Assessment & Plan Note (Signed)
Discussed that the patient needs to eat high calorie foods.  Discussed that I would not recommend Zyprexa for an appetite stimulant.  Discussed that the risk of the medication likely outweighs any benefit.  Discussed there were other medications that we could try to help increase her appetite though they all carry some risk.  These were deferred at this time.  They will get her to drink 2 protein shakes per day.  They will allow her to eat essentially what ever she wants to eat but is a high calorie food.

## 2021-07-30 NOTE — Assessment & Plan Note (Signed)
Discussed she should continue omeprazole 40 mg once daily.  She will continue Pepcid 20 mg nightly.  We will get a swallow study to determine if there is any underlying issue that could be contributing to her reflux/regurgitation.

## 2021-07-31 ENCOUNTER — Other Ambulatory Visit: Payer: Self-pay | Admitting: Interventional Radiology

## 2021-07-31 ENCOUNTER — Telehealth: Payer: Self-pay | Admitting: Family Medicine

## 2021-07-31 DIAGNOSIS — K819 Cholecystitis, unspecified: Secondary | ICD-10-CM

## 2021-07-31 NOTE — Telephone Encounter (Signed)
Kylie May from [facility], [phone]. She needs some diagnosis confirmed,  left forearm fracture, left index finger fracture, and Cataracts, and  hard of hearing.

## 2021-07-31 NOTE — Telephone Encounter (Signed)
I called stephanie and I gave her the codes for the fractures and I informed her that the cataract is not on our problem list that the patient may have that diagnosis from her eye provider and she understood.  Lasya Vetter,cma

## 2021-08-01 DIAGNOSIS — D62 Acute posthemorrhagic anemia: Secondary | ICD-10-CM | POA: Diagnosis not present

## 2021-08-01 DIAGNOSIS — S62601D Fracture of unspecified phalanx of left index finger, subsequent encounter for fracture with routine healing: Secondary | ICD-10-CM | POA: Diagnosis not present

## 2021-08-01 DIAGNOSIS — I5032 Chronic diastolic (congestive) heart failure: Secondary | ICD-10-CM | POA: Diagnosis not present

## 2021-08-01 DIAGNOSIS — S52502D Unspecified fracture of the lower end of left radius, subsequent encounter for closed fracture with routine healing: Secondary | ICD-10-CM | POA: Diagnosis not present

## 2021-08-01 DIAGNOSIS — S42202D Unspecified fracture of upper end of left humerus, subsequent encounter for fracture with routine healing: Secondary | ICD-10-CM | POA: Diagnosis not present

## 2021-08-01 DIAGNOSIS — I482 Chronic atrial fibrillation, unspecified: Secondary | ICD-10-CM | POA: Diagnosis not present

## 2021-08-02 ENCOUNTER — Encounter: Payer: Self-pay | Admitting: Anesthesiology

## 2021-08-02 DIAGNOSIS — S42202D Unspecified fracture of upper end of left humerus, subsequent encounter for fracture with routine healing: Secondary | ICD-10-CM | POA: Diagnosis not present

## 2021-08-02 DIAGNOSIS — D62 Acute posthemorrhagic anemia: Secondary | ICD-10-CM | POA: Diagnosis not present

## 2021-08-02 DIAGNOSIS — I5032 Chronic diastolic (congestive) heart failure: Secondary | ICD-10-CM | POA: Diagnosis not present

## 2021-08-02 DIAGNOSIS — I482 Chronic atrial fibrillation, unspecified: Secondary | ICD-10-CM | POA: Diagnosis not present

## 2021-08-02 DIAGNOSIS — S62601D Fracture of unspecified phalanx of left index finger, subsequent encounter for fracture with routine healing: Secondary | ICD-10-CM | POA: Diagnosis not present

## 2021-08-02 DIAGNOSIS — S52502D Unspecified fracture of the lower end of left radius, subsequent encounter for closed fracture with routine healing: Secondary | ICD-10-CM | POA: Diagnosis not present

## 2021-08-03 ENCOUNTER — Other Ambulatory Visit: Payer: Self-pay | Admitting: Interventional Radiology

## 2021-08-03 ENCOUNTER — Ambulatory Visit
Admission: RE | Admit: 2021-08-03 | Discharge: 2021-08-03 | Disposition: A | Discharge status: Home / Self Care | Payer: Medicare Other | Source: Ambulatory Visit | Attending: Interventional Radiology | Admitting: Interventional Radiology

## 2021-08-03 DIAGNOSIS — Z435 Encounter for attention to cystostomy: Secondary | ICD-10-CM | POA: Insufficient documentation

## 2021-08-03 DIAGNOSIS — Z434 Encounter for attention to other artificial openings of digestive tract: Secondary | ICD-10-CM | POA: Diagnosis not present

## 2021-08-03 DIAGNOSIS — K819 Cholecystitis, unspecified: Secondary | ICD-10-CM

## 2021-08-03 HISTORY — PX: IR EXCHANGE BILIARY DRAIN: IMG6046

## 2021-08-03 MED ORDER — LIDOCAINE HCL 1 % IJ SOLN
INTRAMUSCULAR | Status: AC
  Administered 2021-08-03: 6 mL
  Filled 2021-08-03: qty 20

## 2021-08-03 MED ORDER — IOHEXOL 350 MG/ML SOLN
3.0000 mL | Freq: Once | INTRAVENOUS | Status: AC | PRN
  Administered 2021-08-03: 5 mL
  Filled 2021-08-03: qty 5

## 2021-08-06 ENCOUNTER — Other Ambulatory Visit: Payer: Self-pay

## 2021-08-06 ENCOUNTER — Other Ambulatory Visit (HOSPITAL_COMMUNITY): Payer: Self-pay

## 2021-08-06 MED ORDER — NORMAL SALINE FLUSH 0.9 % IV SOLN
INTRAVENOUS | 3 refills | Status: DC
  Filled 2021-08-06: qty 500, 50d supply, fill #0
  Filled 2021-09-24: qty 500, 50d supply, fill #1
  Filled 2021-11-13: qty 500, 50d supply, fill #2
  Filled 2022-01-08: qty 500, 50d supply, fill #3

## 2021-08-07 ENCOUNTER — Telehealth: Payer: Self-pay | Admitting: *Deleted

## 2021-08-07 ENCOUNTER — Other Ambulatory Visit (HOSPITAL_COMMUNITY): Payer: Self-pay

## 2021-08-07 NOTE — Telephone Encounter (Signed)
I called to speak with family member after someone called today to schedule a lab appt. On 08/10/21. Last lab note from 07/27/21 states to recheck CBC in 6wks at F/U visit. ? ?I believe I spoke with the grandson & he just wanted her to continue to have maintenance labs done. ? ?Her F/U visit is scheduled on 09/10/21. ? ?Please place future orders if any are needed or have CMA contact family member.  ? ?Thanks ?

## 2021-08-07 NOTE — Telephone Encounter (Signed)
I called the patients grandson and  he stated he  just wanted to check her WBC to see if it is okay, he is just worried. I informed him that the provider stated that she just had labs and needed to wait until her visit in 1 month, I informed the grandson that if she seems to  have any symptoms that are unexplainable to give is a call and he understood.  Lylian Sanagustin,cma  ?

## 2021-08-07 NOTE — Telephone Encounter (Signed)
The patient had lab work less than 2 weeks ago.  There is no indication for follow-up lab work at this time.  We can plan on checking labs at her visit in 1 month. ?

## 2021-08-08 DIAGNOSIS — S42202D Unspecified fracture of upper end of left humerus, subsequent encounter for fracture with routine healing: Secondary | ICD-10-CM | POA: Diagnosis not present

## 2021-08-08 DIAGNOSIS — I482 Chronic atrial fibrillation, unspecified: Secondary | ICD-10-CM | POA: Diagnosis not present

## 2021-08-08 DIAGNOSIS — I5032 Chronic diastolic (congestive) heart failure: Secondary | ICD-10-CM | POA: Diagnosis not present

## 2021-08-08 DIAGNOSIS — S62601D Fracture of unspecified phalanx of left index finger, subsequent encounter for fracture with routine healing: Secondary | ICD-10-CM | POA: Diagnosis not present

## 2021-08-08 DIAGNOSIS — S52502D Unspecified fracture of the lower end of left radius, subsequent encounter for closed fracture with routine healing: Secondary | ICD-10-CM | POA: Diagnosis not present

## 2021-08-08 DIAGNOSIS — D62 Acute posthemorrhagic anemia: Secondary | ICD-10-CM | POA: Diagnosis not present

## 2021-08-10 ENCOUNTER — Other Ambulatory Visit: Payer: Medicare Other

## 2021-08-10 ENCOUNTER — Ambulatory Visit: Payer: Medicare Other | Admitting: Family Medicine

## 2021-08-10 DIAGNOSIS — S62601D Fracture of unspecified phalanx of left index finger, subsequent encounter for fracture with routine healing: Secondary | ICD-10-CM | POA: Diagnosis not present

## 2021-08-10 DIAGNOSIS — I482 Chronic atrial fibrillation, unspecified: Secondary | ICD-10-CM | POA: Diagnosis not present

## 2021-08-10 DIAGNOSIS — I5032 Chronic diastolic (congestive) heart failure: Secondary | ICD-10-CM | POA: Diagnosis not present

## 2021-08-10 DIAGNOSIS — S42202D Unspecified fracture of upper end of left humerus, subsequent encounter for fracture with routine healing: Secondary | ICD-10-CM | POA: Diagnosis not present

## 2021-08-10 DIAGNOSIS — S52502D Unspecified fracture of the lower end of left radius, subsequent encounter for closed fracture with routine healing: Secondary | ICD-10-CM | POA: Diagnosis not present

## 2021-08-10 DIAGNOSIS — D62 Acute posthemorrhagic anemia: Secondary | ICD-10-CM | POA: Diagnosis not present

## 2021-08-10 NOTE — Addendum Note (Signed)
Addended by: Leeanne Rio on: 08/10/2021 02:50 PM   Modules accepted: Orders

## 2021-08-10 NOTE — Addendum Note (Signed)
Addended by: Neta Ehlers on: 08/10/2021 01:56 PM   Modules accepted: Orders

## 2021-08-13 DIAGNOSIS — Z85828 Personal history of other malignant neoplasm of skin: Secondary | ICD-10-CM | POA: Diagnosis not present

## 2021-08-13 DIAGNOSIS — D225 Melanocytic nevi of trunk: Secondary | ICD-10-CM | POA: Diagnosis not present

## 2021-08-13 DIAGNOSIS — L853 Xerosis cutis: Secondary | ICD-10-CM | POA: Diagnosis not present

## 2021-08-13 DIAGNOSIS — D2261 Melanocytic nevi of right upper limb, including shoulder: Secondary | ICD-10-CM | POA: Diagnosis not present

## 2021-08-13 DIAGNOSIS — D2272 Melanocytic nevi of left lower limb, including hip: Secondary | ICD-10-CM | POA: Diagnosis not present

## 2021-08-13 DIAGNOSIS — D2262 Melanocytic nevi of left upper limb, including shoulder: Secondary | ICD-10-CM | POA: Diagnosis not present

## 2021-08-13 DIAGNOSIS — L570 Actinic keratosis: Secondary | ICD-10-CM | POA: Diagnosis not present

## 2021-08-15 DIAGNOSIS — I482 Chronic atrial fibrillation, unspecified: Secondary | ICD-10-CM | POA: Diagnosis not present

## 2021-08-15 DIAGNOSIS — S42202D Unspecified fracture of upper end of left humerus, subsequent encounter for fracture with routine healing: Secondary | ICD-10-CM | POA: Diagnosis not present

## 2021-08-15 DIAGNOSIS — D62 Acute posthemorrhagic anemia: Secondary | ICD-10-CM | POA: Diagnosis not present

## 2021-08-15 DIAGNOSIS — I5032 Chronic diastolic (congestive) heart failure: Secondary | ICD-10-CM | POA: Diagnosis not present

## 2021-08-15 DIAGNOSIS — S62601D Fracture of unspecified phalanx of left index finger, subsequent encounter for fracture with routine healing: Secondary | ICD-10-CM | POA: Diagnosis not present

## 2021-08-15 DIAGNOSIS — S52502D Unspecified fracture of the lower end of left radius, subsequent encounter for closed fracture with routine healing: Secondary | ICD-10-CM | POA: Diagnosis not present

## 2021-08-17 DIAGNOSIS — S62601D Fracture of unspecified phalanx of left index finger, subsequent encounter for fracture with routine healing: Secondary | ICD-10-CM | POA: Diagnosis not present

## 2021-08-17 DIAGNOSIS — S52502D Unspecified fracture of the lower end of left radius, subsequent encounter for closed fracture with routine healing: Secondary | ICD-10-CM | POA: Diagnosis not present

## 2021-08-17 DIAGNOSIS — I5032 Chronic diastolic (congestive) heart failure: Secondary | ICD-10-CM | POA: Diagnosis not present

## 2021-08-17 DIAGNOSIS — S42202D Unspecified fracture of upper end of left humerus, subsequent encounter for fracture with routine healing: Secondary | ICD-10-CM | POA: Diagnosis not present

## 2021-08-17 DIAGNOSIS — D62 Acute posthemorrhagic anemia: Secondary | ICD-10-CM | POA: Diagnosis not present

## 2021-08-17 DIAGNOSIS — I482 Chronic atrial fibrillation, unspecified: Secondary | ICD-10-CM | POA: Diagnosis not present

## 2021-08-20 ENCOUNTER — Other Ambulatory Visit: Payer: Self-pay

## 2021-08-20 MED ORDER — ROSUVASTATIN CALCIUM 20 MG PO TABS: 20.0000 mg | ORAL_TABLET | Freq: Every day | ORAL | 3 refills | Status: DC

## 2021-08-20 MED ORDER — APIXABAN 2.5 MG PO TABS: 2.5000 mg | ORAL_TABLET | Freq: Two times a day (BID) | ORAL | 2 refills | Status: DC

## 2021-08-20 NOTE — Telephone Encounter (Signed)
*  STAT* If patient is at the pharmacy, call can be transferred to refill team.   1. Which medications need to be refilled? (please list name of each medication and dose if known) Eliquis, Crestor  2. Which pharmacy/location (including street and city if local pharmacy) is medication to be sent to?[facility]  3. Do they need a 30 day or 90 day supply? [place]

## 2021-08-20 NOTE — Telephone Encounter (Signed)
Please review

## 2021-08-20 NOTE — Telephone Encounter (Signed)
Pt last saw Dr Rockey Situ 06/12/21, last labs 07/27/21 Creat 0.98, age 88, weight 43.9kg, based on specified criteria pt is on appropriate dosage of Eliquis 2.'5mg'$  BID for afib.  Will refill rx.  ?

## 2021-08-21 DIAGNOSIS — D62 Acute posthemorrhagic anemia: Secondary | ICD-10-CM | POA: Diagnosis not present

## 2021-08-21 DIAGNOSIS — S42202D Unspecified fracture of upper end of left humerus, subsequent encounter for fracture with routine healing: Secondary | ICD-10-CM | POA: Diagnosis not present

## 2021-08-21 DIAGNOSIS — I5032 Chronic diastolic (congestive) heart failure: Secondary | ICD-10-CM | POA: Diagnosis not present

## 2021-08-21 DIAGNOSIS — S52502D Unspecified fracture of the lower end of left radius, subsequent encounter for closed fracture with routine healing: Secondary | ICD-10-CM | POA: Diagnosis not present

## 2021-08-21 DIAGNOSIS — I482 Chronic atrial fibrillation, unspecified: Secondary | ICD-10-CM | POA: Diagnosis not present

## 2021-08-21 DIAGNOSIS — S62601D Fracture of unspecified phalanx of left index finger, subsequent encounter for fracture with routine healing: Secondary | ICD-10-CM | POA: Diagnosis not present

## 2021-08-23 ENCOUNTER — Other Ambulatory Visit: Payer: Self-pay

## 2021-08-23 MED ORDER — AMIODARONE HCL 200 MG PO TABS: 200.0000 mg | ORAL_TABLET | Freq: Every day | ORAL | 2 refills | Status: DC

## 2021-08-24 DIAGNOSIS — Z20822 Contact with and (suspected) exposure to covid-19: Secondary | ICD-10-CM | POA: Diagnosis not present

## 2021-08-27 DIAGNOSIS — S62601D Fracture of unspecified phalanx of left index finger, subsequent encounter for fracture with routine healing: Secondary | ICD-10-CM | POA: Diagnosis not present

## 2021-08-27 DIAGNOSIS — S52502D Unspecified fracture of the lower end of left radius, subsequent encounter for closed fracture with routine healing: Secondary | ICD-10-CM | POA: Diagnosis not present

## 2021-08-27 DIAGNOSIS — D62 Acute posthemorrhagic anemia: Secondary | ICD-10-CM | POA: Diagnosis not present

## 2021-08-27 DIAGNOSIS — S42202D Unspecified fracture of upper end of left humerus, subsequent encounter for fracture with routine healing: Secondary | ICD-10-CM | POA: Diagnosis not present

## 2021-08-27 DIAGNOSIS — I482 Chronic atrial fibrillation, unspecified: Secondary | ICD-10-CM | POA: Diagnosis not present

## 2021-08-27 DIAGNOSIS — I5032 Chronic diastolic (congestive) heart failure: Secondary | ICD-10-CM | POA: Diagnosis not present

## 2021-08-28 DIAGNOSIS — S62601D Fracture of unspecified phalanx of left index finger, subsequent encounter for fracture with routine healing: Secondary | ICD-10-CM | POA: Diagnosis not present

## 2021-08-28 DIAGNOSIS — I5032 Chronic diastolic (congestive) heart failure: Secondary | ICD-10-CM | POA: Diagnosis not present

## 2021-08-28 DIAGNOSIS — K5790 Diverticulosis of intestine, part unspecified, without perforation or abscess without bleeding: Secondary | ICD-10-CM | POA: Diagnosis not present

## 2021-08-28 DIAGNOSIS — D62 Acute posthemorrhagic anemia: Secondary | ICD-10-CM | POA: Diagnosis not present

## 2021-08-28 DIAGNOSIS — I081 Rheumatic disorders of both mitral and tricuspid valves: Secondary | ICD-10-CM | POA: Diagnosis not present

## 2021-08-28 DIAGNOSIS — S52502D Unspecified fracture of the lower end of left radius, subsequent encounter for closed fracture with routine healing: Secondary | ICD-10-CM | POA: Diagnosis not present

## 2021-08-28 DIAGNOSIS — Z9049 Acquired absence of other specified parts of digestive tract: Secondary | ICD-10-CM | POA: Diagnosis not present

## 2021-08-28 DIAGNOSIS — I482 Chronic atrial fibrillation, unspecified: Secondary | ICD-10-CM | POA: Diagnosis not present

## 2021-08-28 DIAGNOSIS — E7800 Pure hypercholesterolemia, unspecified: Secondary | ICD-10-CM | POA: Diagnosis not present

## 2021-08-28 DIAGNOSIS — N6019 Diffuse cystic mastopathy of unspecified breast: Secondary | ICD-10-CM | POA: Diagnosis not present

## 2021-08-28 DIAGNOSIS — Z434 Encounter for attention to other artificial openings of digestive tract: Secondary | ICD-10-CM | POA: Diagnosis not present

## 2021-08-28 DIAGNOSIS — M810 Age-related osteoporosis without current pathological fracture: Secondary | ICD-10-CM | POA: Diagnosis not present

## 2021-08-28 DIAGNOSIS — Z79899 Other long term (current) drug therapy: Secondary | ICD-10-CM | POA: Diagnosis not present

## 2021-08-28 DIAGNOSIS — Z7901 Long term (current) use of anticoagulants: Secondary | ICD-10-CM | POA: Diagnosis not present

## 2021-08-28 DIAGNOSIS — Z85828 Personal history of other malignant neoplasm of skin: Secondary | ICD-10-CM | POA: Diagnosis not present

## 2021-08-28 DIAGNOSIS — H9190 Unspecified hearing loss, unspecified ear: Secondary | ICD-10-CM | POA: Diagnosis not present

## 2021-08-28 DIAGNOSIS — S42202D Unspecified fracture of upper end of left humerus, subsequent encounter for fracture with routine healing: Secondary | ICD-10-CM | POA: Diagnosis not present

## 2021-08-28 DIAGNOSIS — K449 Diaphragmatic hernia without obstruction or gangrene: Secondary | ICD-10-CM | POA: Diagnosis not present

## 2021-08-28 DIAGNOSIS — K219 Gastro-esophageal reflux disease without esophagitis: Secondary | ICD-10-CM | POA: Diagnosis not present

## 2021-08-28 DIAGNOSIS — Z9181 History of falling: Secondary | ICD-10-CM | POA: Diagnosis not present

## 2021-08-31 DIAGNOSIS — Z20822 Contact with and (suspected) exposure to covid-19: Secondary | ICD-10-CM | POA: Diagnosis not present

## 2021-09-03 DIAGNOSIS — I5032 Chronic diastolic (congestive) heart failure: Secondary | ICD-10-CM | POA: Diagnosis not present

## 2021-09-03 DIAGNOSIS — D62 Acute posthemorrhagic anemia: Secondary | ICD-10-CM | POA: Diagnosis not present

## 2021-09-03 DIAGNOSIS — S62601D Fracture of unspecified phalanx of left index finger, subsequent encounter for fracture with routine healing: Secondary | ICD-10-CM | POA: Diagnosis not present

## 2021-09-03 DIAGNOSIS — S52502D Unspecified fracture of the lower end of left radius, subsequent encounter for closed fracture with routine healing: Secondary | ICD-10-CM | POA: Diagnosis not present

## 2021-09-03 DIAGNOSIS — S42202D Unspecified fracture of upper end of left humerus, subsequent encounter for fracture with routine healing: Secondary | ICD-10-CM | POA: Diagnosis not present

## 2021-09-03 DIAGNOSIS — I482 Chronic atrial fibrillation, unspecified: Secondary | ICD-10-CM | POA: Diagnosis not present

## 2021-09-07 ENCOUNTER — Other Ambulatory Visit: Payer: Self-pay | Admitting: *Deleted

## 2021-09-07 MED ORDER — METOPROLOL TARTRATE 25 MG PO TABS: 25.0000 mg | ORAL_TABLET | Freq: Two times a day (BID) | ORAL | 3 refills | Status: DC

## 2021-09-10 ENCOUNTER — Encounter: Payer: Self-pay | Admitting: Family Medicine

## 2021-09-10 ENCOUNTER — Ambulatory Visit (INDEPENDENT_AMBULATORY_CARE_PROVIDER_SITE_OTHER): Payer: Medicare Other | Admitting: Family Medicine

## 2021-09-10 VITALS — BP 100/60 | HR 59 | Temp 97.7°F | Ht 60.0 in | Wt 101.4 lb

## 2021-09-10 DIAGNOSIS — E43 Unspecified severe protein-calorie malnutrition: Secondary | ICD-10-CM

## 2021-09-10 DIAGNOSIS — M79605 Pain in left leg: Secondary | ICD-10-CM | POA: Diagnosis not present

## 2021-09-10 DIAGNOSIS — R1319 Other dysphagia: Secondary | ICD-10-CM

## 2021-09-10 DIAGNOSIS — D72829 Elevated white blood cell count, unspecified: Secondary | ICD-10-CM | POA: Diagnosis not present

## 2021-09-10 DIAGNOSIS — N179 Acute kidney failure, unspecified: Secondary | ICD-10-CM | POA: Diagnosis not present

## 2021-09-10 DIAGNOSIS — E782 Mixed hyperlipidemia: Secondary | ICD-10-CM

## 2021-09-10 LAB — CBC WITH DIFFERENTIAL/PLATELET
Basophils Absolute: 0.1 10*3/uL (ref 0.0–0.1)
Basophils Relative: 0.8 % (ref 0.0–3.0)
Eosinophils Absolute: 0.1 10*3/uL (ref 0.0–0.7)
Eosinophils Relative: 1.4 % (ref 0.0–5.0)
HCT: 39.7 % (ref 36.0–46.0)
Hemoglobin: 13.2 g/dL (ref 12.0–15.0)
Lymphocytes Relative: 21.6 % (ref 12.0–46.0)
Lymphs Abs: 2 10*3/uL (ref 0.7–4.0)
MCHC: 33.3 g/dL (ref 30.0–36.0)
MCV: 95.6 fl (ref 78.0–100.0)
Monocytes Absolute: 0.8 10*3/uL (ref 0.1–1.0)
Monocytes Relative: 8.5 % (ref 3.0–12.0)
Neutro Abs: 6.2 10*3/uL (ref 1.4–7.7)
Neutrophils Relative %: 67.7 % (ref 43.0–77.0)
Platelets: 260 10*3/uL (ref 150.0–400.0)
RBC: 4.16 Mil/uL (ref 3.87–5.11)
RDW: 12.6 % (ref 11.5–15.5)
WBC: 9.1 10*3/uL (ref 4.0–10.5)

## 2021-09-10 LAB — BASIC METABOLIC PANEL
BUN: 27 mg/dL — ABNORMAL HIGH (ref 6–23)
CO2: 28 mEq/L (ref 19–32)
Calcium: 10 mg/dL (ref 8.4–10.5)
Chloride: 102 mEq/L (ref 96–112)
Creatinine, Ser: 0.9 mg/dL (ref 0.40–1.20)
GFR: 53.45 mL/min — ABNORMAL LOW (ref >60.00)
Glucose, Bld: 95 mg/dL (ref 70–99)
Potassium: 4.7 mEq/L (ref 3.5–5.1)
Sodium: 136 mEq/L (ref 135–145)

## 2021-09-10 NOTE — Assessment & Plan Note (Signed)
Recheck today. 

## 2021-09-10 NOTE — Progress Notes (Signed)
?Tommi Rumps, MD ?Phone: [phone] ? ?Kylie May is a 88 y.o. female who presents today for f/u. ? ?Dysphagia: The patient's family notes this might be somewhat better after being on omeprazole and Pepcid on an ongoing basis.  They decided not to go through the swallow study given the difficulty it might of posed for the patient to complete this. ? ?Protein calorie malnutrition: Patient's weight has increased.  They are doing protein shakes.  She is eating a larger variety of foods now. ? ?Left leg pain: They note this has been going on recently.  She had some pain that started in her calf and has moved up her leg.  It occurs intermittently.  They describe it as intense.  At times her leg will be numb with this.  She had some swelling though they are unsure if this is much different than chronic mild swelling.  She does have chronic back issues.  She is on Eliquis. ? ?Social History  ? ?Tobacco Use  ?Smoking Status Former  ? Types: Cigarettes  ? Quit date: 06/04/1951  ? Years since quitting: 70.3  ?Smokeless Tobacco Never  ?Tobacco Comments  ? Smoked 1945-1953 , up to 2 cigarettes/day  ? ? ?Current Outpatient Medications on File Prior to Visit  ?Medication Sig Dispense Refill  ? acetaminophen (TYLENOL) 325 MG tablet Take 2 tablets (650 mg total) by mouth every 6 (six) hours as needed for mild pain (or Fever >/= 101).    ? amiodarone (PACERONE) 200 MG tablet Take 1 tablet (200 mg total) by mouth daily. 90 tablet 2  ? apixaban (ELIQUIS) 2.5 MG TABS tablet Take 1 tablet (2.5 mg total) by mouth 2 (two) times daily. 180 tablet 2  ? ferrous sulfate 325 (65 FE) MG EC tablet Take 325 mg by mouth daily.    ? fluticasone (FLONASE) 50 MCG/ACT nasal spray USE 2 SPRAYS IN EACH NOSTRIL DAILY 16 g 11  ? hydrocortisone (ANUSOL-HC) 2.5 % rectal cream Place 1 application rectally 2 (two) times daily. 30 g 0  ? levothyroxine (SYNTHROID) 50 MCG tablet Take 1 tablet (50 mcg total) by mouth daily. 90 tablet 3  ? magnesium  chloride (SLOW-MAG) 64 MG TBEC SR tablet Take 2 tablets (128 mg total) by mouth daily. 60 tablet 3  ? metoprolol tartrate (LOPRESSOR) 25 MG tablet Take 1 tablet (25 mg total) by mouth 2 (two) times daily. 180 tablet 3  ? nystatin cream (MYCOSTATIN) nystatin 100,000 unit/gram topical cream ? APPLY TOPICALLY TO THE AFFECTED AREA TWICE DAILY    ? omeprazole (PRILOSEC) 40 MG capsule TAKE 1 CAPSULE DAILY 90 capsule 3  ? rosuvastatin (CRESTOR) 20 MG tablet Take 1 tablet (20 mg total) by mouth at bedtime. 90 tablet 3  ? sodium chloride 1 g tablet Take 1 tablet (1 g total) by mouth 2 (two) times daily with a meal. 60 tablet 0  ? benzonatate (TESSALON) 200 MG capsule Take 1 capsule (200 mg total) by mouth 2 (two) times daily as needed for cough. (Patient not taking: Reported on 09/10/2021) 20 capsule 0  ? hydrocortisone (ANUSOL-HC) 25 MG suppository Place 1 suppository (25 mg total) rectally 2 (two) times daily as needed for hemorrhoids or anal itching. (Patient not taking: Reported on 09/10/2021) 14 suppository 1  ? OLANZapine (ZYPREXA) 2.5 MG tablet Take 1 tablet (2.5 mg total) by mouth at bedtime. (Patient not taking: Reported on 09/10/2021) 30 tablet 0  ? Sodium Chloride Flush (NORMAL SALINE FLUSH) 0.9 % SOLN FLUSH ONCE  DAILY AS DIRECTED (Patient not taking: Reported on 09/10/2021) 500 mL 3  ? traMADol (ULTRAM) 50 MG tablet Take 50 mg by mouth every 6 (six) hours as needed. (Patient not taking: Reported on 09/10/2021)    ? ?No current facility-administered medications on file prior to visit.  ? ? ? ?ROS see history of present illness ? ?Objective ? ?Physical Exam ?Vitals:  ? 09/10/21 1148  ?BP: 100/60  ?Pulse: (!) 59  ?Temp: 97.7 ?F (36.5 ?C)  ?SpO2: 95%  ? ? ?BP Readings from Last 3 Encounters:  ?09/10/21 100/60  ?07/27/21 100/60  ?07/17/21 138/77  ? ?Wt Readings from Last 3 Encounters:  ?09/10/21 101 lb 6.4 oz (46 kg)  ?07/27/21 96 lb 12.8 oz (43.9 kg)  ?06/26/21 103 lb (46.7 kg)  ? ? ?Physical Exam ?Constitutional:   ?    General: She is not in acute distress. ?   Appearance: She is not diaphoretic.  ?Cardiovascular:  ?   Rate and Rhythm: Normal rate and regular rhythm.  ?   Heart sounds: Normal heart sounds.  ?Pulmonary:  ?   Effort: Pulmonary effort is normal.  ?   Breath sounds: Normal breath sounds.  ?Musculoskeletal:  ?   Comments: No tenderness of her left lower extremity, no swelling in her lower extremities, 5/5 strength bilateral quads, hamstrings, plantarflexion, and dorsiflexion, sensation light touch intact bilateral lower extremities  ?Skin: ?   General: Skin is warm and dry.  ?Neurological:  ?   Mental Status: She is alert.  ? ? ? ?Assessment/Plan: Please see individual problem list. ? ?Problem List Items Addressed This Visit   ? ? Dysphagia (Chronic)  ?  Somewhat improved.  This could be related to her reflux.  She did see GI who did not recommend an EGD.  At this time she will continue omeprazole 40 mg once daily and Pepcid 20 mg daily. ?  ?  ? Protein-calorie malnutrition ([facility]) (Chronic)  ?  Weight has trended up.  I encouraged continued protein shakes and larger variety of foods. ?  ?  ? AKI (acute kidney injury) ([place])  ?  Recheck today. ?  ?  ? Relevant Orders  ? Basic Metabolic Panel (BMET)  ? Hyperlipidemia  ?  Notes no history of stroke or heart attack.  Crestor was recently held in the setting of elevated liver enzymes.  Given her lack of a cardiovascular history we will not resume her Crestor at this time as the risk likely outweighs any benefit given her age. ?  ?  ? Left leg pain - Primary  ?  Discussed that this could indicate a DVT or a pinched nerve in her back.  We will get an ultrasound to rule out DVT.  If that is negative we will treat with prednisone to see if that will help with the pinched nerve possibility.  They are advised to seek medical attention if the patient develop chest pain or shortness of breath. ?  ?  ? Relevant Orders  ? US Venous Img Lower Unilateral Left  ? Leukocytosis  ?  Mild  elevation.  We will recheck today. ?  ?  ? Relevant Orders  ? CBC w/Diff  ? ? ? ?Return in about 3 months (around 12/10/2021). ? ?This visit occurred during the SARS-CoV-2 public health emergency.  Safety protocols were in place, including screening questions prior to the visit, additional usage of staff PPE, and extensive cleaning of exam room while observing appropriate contact time as indicated  for disinfecting solutions.  ? ? ?Tommi Rumps, MD ?[place] ? ?

## 2021-09-10 NOTE — Assessment & Plan Note (Signed)
Weight has trended up.  I encouraged continued protein shakes and larger variety of foods. ?

## 2021-09-10 NOTE — Assessment & Plan Note (Signed)
Mild elevation.  We will recheck today. ?

## 2021-09-10 NOTE — Assessment & Plan Note (Addendum)
Discussed that this could indicate a DVT or a pinched nerve in her back.  We will get an ultrasound to rule out DVT.  If that is negative we will treat with prednisone to see if that will help with the pinched nerve possibility.  They are advised to seek medical attention if the patient develop chest pain or shortness of breath. ?

## 2021-09-10 NOTE — Assessment & Plan Note (Signed)
Somewhat improved.  This could be related to her reflux.  She did see GI who did not recommend an EGD.  At this time she will continue omeprazole 40 mg once daily and Pepcid 20 mg daily. ?

## 2021-09-10 NOTE — Assessment & Plan Note (Signed)
Notes no history of stroke or heart attack.  Crestor was recently held in the setting of elevated liver enzymes.  Given her lack of a cardiovascular history we will not resume her Crestor at this time as the risk likely outweighs any benefit given her age. ?

## 2021-09-10 NOTE — Patient Instructions (Signed)
Nice to see you. ?We will get an ultrasound today and contact you with the results.  If you develop chest pain or shortness of breath please go to the emergency room. ?Please continue with high calorie foods and your protein shakes. ?

## 2021-09-11 ENCOUNTER — Ambulatory Visit
Admission: RE | Admit: 2021-09-11 | Discharge: 2021-09-11 | Disposition: A | Discharge status: Home / Self Care | Payer: Medicare Other | Source: Ambulatory Visit | Attending: Family Medicine | Admitting: Family Medicine

## 2021-09-11 DIAGNOSIS — R600 Localized edema: Secondary | ICD-10-CM | POA: Diagnosis not present

## 2021-09-11 DIAGNOSIS — M79605 Pain in left leg: Secondary | ICD-10-CM | POA: Diagnosis not present

## 2021-09-11 DIAGNOSIS — M7989 Other specified soft tissue disorders: Secondary | ICD-10-CM | POA: Diagnosis not present

## 2021-09-14 DIAGNOSIS — S52502D Unspecified fracture of the lower end of left radius, subsequent encounter for closed fracture with routine healing: Secondary | ICD-10-CM | POA: Diagnosis not present

## 2021-09-14 DIAGNOSIS — I482 Chronic atrial fibrillation, unspecified: Secondary | ICD-10-CM | POA: Diagnosis not present

## 2021-09-14 DIAGNOSIS — I5032 Chronic diastolic (congestive) heart failure: Secondary | ICD-10-CM | POA: Diagnosis not present

## 2021-09-14 DIAGNOSIS — D62 Acute posthemorrhagic anemia: Secondary | ICD-10-CM | POA: Diagnosis not present

## 2021-09-14 DIAGNOSIS — S42202D Unspecified fracture of upper end of left humerus, subsequent encounter for fracture with routine healing: Secondary | ICD-10-CM | POA: Diagnosis not present

## 2021-09-14 DIAGNOSIS — S62601D Fracture of unspecified phalanx of left index finger, subsequent encounter for fracture with routine healing: Secondary | ICD-10-CM | POA: Diagnosis not present

## 2021-09-17 DIAGNOSIS — Z20822 Contact with and (suspected) exposure to covid-19: Secondary | ICD-10-CM | POA: Diagnosis not present

## 2021-09-18 ENCOUNTER — Telehealth: Payer: Self-pay | Admitting: Family Medicine

## 2021-09-18 NOTE — Telephone Encounter (Signed)
Stephanie From [facility] called in stating that Pt will be discharge this week from home health... Colletta Maryland stated that all pt long term goals have not been meet... Colletta Maryland stated that the plan is to discharge pt from home health... Colletta Maryland stated that pt maximum potential have been reach... Colletta Maryland phone number is [phone] ?

## 2021-09-19 DIAGNOSIS — S52502D Unspecified fracture of the lower end of left radius, subsequent encounter for closed fracture with routine healing: Secondary | ICD-10-CM | POA: Diagnosis not present

## 2021-09-19 DIAGNOSIS — I482 Chronic atrial fibrillation, unspecified: Secondary | ICD-10-CM | POA: Diagnosis not present

## 2021-09-19 DIAGNOSIS — S62601D Fracture of unspecified phalanx of left index finger, subsequent encounter for fracture with routine healing: Secondary | ICD-10-CM | POA: Diagnosis not present

## 2021-09-19 DIAGNOSIS — I5032 Chronic diastolic (congestive) heart failure: Secondary | ICD-10-CM | POA: Diagnosis not present

## 2021-09-19 DIAGNOSIS — D62 Acute posthemorrhagic anemia: Secondary | ICD-10-CM | POA: Diagnosis not present

## 2021-09-19 DIAGNOSIS — S42202D Unspecified fracture of upper end of left humerus, subsequent encounter for fracture with routine healing: Secondary | ICD-10-CM | POA: Diagnosis not present

## 2021-09-19 NOTE — Telephone Encounter (Signed)
Noted  

## 2021-09-24 ENCOUNTER — Other Ambulatory Visit (HOSPITAL_COMMUNITY): Payer: Self-pay

## 2021-09-26 DIAGNOSIS — R051 Acute cough: Secondary | ICD-10-CM | POA: Diagnosis not present

## 2021-09-26 DIAGNOSIS — R059 Cough, unspecified: Secondary | ICD-10-CM | POA: Diagnosis not present

## 2021-09-26 DIAGNOSIS — Z20822 Contact with and (suspected) exposure to covid-19: Secondary | ICD-10-CM | POA: Diagnosis not present

## 2021-09-27 NOTE — Progress Notes (Signed)
Spoke with patient's daughter Santiago Glad 09-27-21 @ 12:25 pm to remind her of patient's appointment and need to arrive at 10:30 for 11:00 appointment. Daughter verbalized understanding. ?

## 2021-09-28 ENCOUNTER — Other Ambulatory Visit: Payer: Self-pay | Admitting: Physician Assistant

## 2021-09-28 ENCOUNTER — Other Ambulatory Visit: Payer: Self-pay | Admitting: Interventional Radiology

## 2021-09-28 ENCOUNTER — Ambulatory Visit
Admission: RE | Admit: 2021-09-28 | Discharge: 2021-09-28 | Disposition: A | Discharge status: Home / Self Care | Payer: Medicare Other | Source: Ambulatory Visit | Attending: Interventional Radiology | Admitting: Interventional Radiology

## 2021-09-28 DIAGNOSIS — Z434 Encounter for attention to other artificial openings of digestive tract: Secondary | ICD-10-CM | POA: Diagnosis not present

## 2021-09-28 DIAGNOSIS — K819 Cholecystitis, unspecified: Secondary | ICD-10-CM | POA: Diagnosis not present

## 2021-09-28 DIAGNOSIS — Z8719 Personal history of other diseases of the digestive system: Secondary | ICD-10-CM

## 2021-09-28 HISTORY — PX: IR EXCHANGE BILIARY DRAIN: IMG6046

## 2021-09-28 MED ORDER — IOHEXOL 350 MG/ML SOLN
5.0000 mL | Freq: Once | INTRAVENOUS | Status: AC | PRN
  Administered 2021-09-28: 5 mL
  Filled 2021-09-28: qty 5

## 2021-09-28 MED ORDER — LIDOCAINE HCL 1 % IJ SOLN
INTRAMUSCULAR | Status: AC
  Administered 2021-09-28: 1 mL
  Filled 2021-09-28: qty 20

## 2021-09-28 NOTE — Procedures (Signed)
Vascular and Interventional Radiology Procedure Note ? ?Patient: Kylie May ?DOB: Oct 15, 1921 ?Medical Record Number: [id number] ?Note Date/Time: 09/28/21 12:07 PM  ? ?Performing Physician: Michaelle Birks, MD ?Assistant(s): None ? ?Diagnosis: Chronic indwelling cholecystostomy. ? ?Procedure:  ?CHOLECYSTOSTOMY TUBE EXCHANGE ?ANTEROGRADE CHOLANGIOGRAM ? ?Anesthesia: Local Anesthetic ?Complications: None ?Estimated Blood Loss:  0 mL ?Specimens:  None ? ?Findings:  ?Patent cystic duct. ?Successful placement of 66F cholecystostomy tube. ? ?Plan:  ?Continue routine drain care. Follow up for routine tube evaluation in 2 month(s).  ? ?I had a conversation with the Pt's Grandson [employer]) and Daughter Nicholaus Corolla) Re her chronic indwelling tube that was placed in 08/16/2018. Pt with patent cystic duct and has previously passed a capping trial however they are fearful of tube removal should she suffer another bout of cholecystitis / choledocholithiasis, given her age and comorbidities. She is not a surgical candidate.  ? ?I briefly discussed percutaneous cholangioscopy "SpyGlass" and removal / lithotripsy of her gallstones in an effort to rid her of her chronic tube. I offered a clinic visit for a formal conversation about the procedure should they be interested in learning more. ? ?See detailed procedure note with images in PACS. ?The patient tolerated the procedure well without incident or complication and was returned to Recovery in stable condition.  ? ? ?Michaelle Birks, MD ?[facility] ?[facility] ? ? ?Pager. [phone] ?Clinic. [phone]  ?

## 2021-10-08 DIAGNOSIS — Z20822 Contact with and (suspected) exposure to covid-19: Secondary | ICD-10-CM | POA: Diagnosis not present

## 2021-11-07 ENCOUNTER — Other Ambulatory Visit: Payer: Self-pay | Admitting: Cardiovascular Disease

## 2021-11-08 ENCOUNTER — Ambulatory Visit
Admission: RE | Admit: 2021-11-08 | Discharge: 2021-11-08 | Disposition: A | Discharge status: Home / Self Care | Payer: Medicare Other | Source: Ambulatory Visit | Attending: Interventional Radiology | Admitting: Interventional Radiology

## 2021-11-08 ENCOUNTER — Telehealth: Payer: Self-pay | Admitting: Radiology

## 2021-11-08 ENCOUNTER — Other Ambulatory Visit: Payer: Self-pay | Admitting: Interventional Radiology

## 2021-11-08 DIAGNOSIS — T85590A Other mechanical complication of bile duct prosthesis, initial encounter: Secondary | ICD-10-CM | POA: Diagnosis not present

## 2021-11-08 DIAGNOSIS — K819 Cholecystitis, unspecified: Secondary | ICD-10-CM

## 2021-11-08 HISTORY — PX: IR RADIOLOGIST EVAL & MGMT: IMG5224

## 2021-11-08 MED ORDER — CEPHALEXIN 500 MG PO CAPS
500.0000 mg | ORAL_CAPSULE | Freq: Two times a day (BID) | ORAL | Status: DC
  Filled 2021-11-08: qty 1

## 2021-11-08 MED ORDER — CEPHALEXIN 500 MG PO CAPS: 500.0000 mg | ORAL_CAPSULE | Freq: Two times a day (BID) | ORAL | 0 refills | Status: AC

## 2021-11-08 MED ORDER — LIDOCAINE HCL 1 % IJ SOLN
INTRAMUSCULAR | Status: AC
  Filled 2021-11-08: qty 20

## 2021-11-08 NOTE — Progress Notes (Signed)
Supervising Physician: Michaelle Birks  Chief Complaint: The patient is seen in follow up today for concerns at her cholecystostomy tube skin site.  History of present illness: This is a 88 year old female with PMHx significant for choledocholithiasis s/p percutaneous cholecystostomy catheter placed by IR 08/16/2018, patient has underwent routine cholecystostomy tube exchanges for years with our service, family is aware of spyglass procedure option and is thinking it over still. The patient presented today with complaints of increased redness and pain with an odor at catheter site. The patient's family members perform drainage catheter dressing care and change it as needed currently. They have been using the nystatin cream twice daily however still concerned about the site.    Past Medical History:  Diagnosis Date   A-fib ([place])    Acid reflux disease    Arrhythmia    Atrial fibrillation with RVR ([place]) 08/15/2018   Chronic diastolic CHF (congestive heart failure) (H[facility])    Compression fracture of lumbar vertebra ([place])    Diverticulitis 01-2008   GI [place]   Diverticulosis    Esophageal stricture 2006   Femoral bruit    Right   Fibrocystic breast disease    Hiatal hernia    History of esophageal stricture 06/14/2004   2006 esophageal dilation Schatzki's ring    History of skin cancer 12/29/2008   Basal cell cancer right glabella 07/10/2010 Dr. Evorn Gong, [facility] @ [place] , [place]    Hypercholesterolemia    [place] study LDL goal = < 160   Mitral regurgitation    a. 06/2014 EF 55-60%, elevated end-diastolic pressures, dilated LA at 4.3 cm, mildly dilated RA, severe mitral regurgitation, mild aortic sclerosis without stenosis, mod-severe TR   Mitral valve prolapse    Osteoporosis    Dr Matthew Saras   Pancreatitis 07-2008   Hospitalized    Patella fracture 05/13/2018   Schatzki's ring    Sepsis ([place]) 08/15/2018   Skin cancer    facial x 2. [name]    Past Surgical History:  Procedure  Laterality Date   BILIARY STENT PLACEMENT  08/21/2018   Procedure: BILIARY STENT PLACEMENT;  Surgeon: [name], MD;  Location: [facility] ENDOSCOPY;  Service: Gastroenterology;;   COLONOSCOPY  2006   Epidural steroids     x1   ERCP N/A 08/21/2018   Procedure: ENDOSCOPIC RETROGRADE CHOLANGIOPANCREATOGRAPHY (ERCP);  Surgeon: [name], MD;  Location: [place];  Service: Gastroenterology;  Laterality: N/A;   ESOPHAGEAL DILATION  2006   ESOPHAGOGASTRODUODENOSCOPY (EGD) WITH PROPOFOL N/A 11/16/2018   Procedure: ESOPHAGOGASTRODUODENOSCOPY (EGD) WITH PROPOFOL;  Surgeon: [name], MD;  Location: [facility] ENDOSCOPY;  Service: Gastroenterology;  Laterality: N/A;   G3 P4 (twins)     IR CHOLANGIOGRAM EXISTING TUBE  08/18/2018   IR EXCHANGE BILIARY DRAIN  11/03/2018   IR EXCHANGE BILIARY DRAIN  12/01/2018   IR EXCHANGE BILIARY DRAIN  05/13/2019   IR EXCHANGE BILIARY DRAIN  06/14/2019   IR EXCHANGE BILIARY DRAIN  07/19/2019   IR EXCHANGE BILIARY DRAIN  08/17/2019   IR EXCHANGE BILIARY DRAIN  09/30/2019   IR EXCHANGE BILIARY DRAIN  12/08/2019   IR EXCHANGE BILIARY DRAIN  01/19/2020   IR EXCHANGE BILIARY DRAIN  03/01/2020   IR EXCHANGE BILIARY DRAIN  04/20/2020   IR EXCHANGE BILIARY DRAIN  07/06/2020   IR EXCHANGE BILIARY DRAIN  10/26/2020   IR EXCHANGE BILIARY DRAIN  01/18/2021   IR EXCHANGE BILIARY DRAIN  03/05/2021   IR EXCHANGE BILIARY DRAIN  05/25/2021  IR EXCHANGE BILIARY DRAIN  08/03/2021   IR EXCHANGE BILIARY DRAIN  09/28/2021   IR PERC CHOLECYSTOSTOMY  08/16/2018   REMOVAL OF STONES  08/21/2018   Procedure: REMOVAL OF STONES;  Surgeon: [name], MD;  Location: [place];  Service: Gastroenterology;;   SKIN CANCER EXCISION     SPHINCTEROTOMY  08/21/2018   Procedure: Joan Mayans;  Surgeon: [name], MD;  Location: [facility] ENDOSCOPY;  Service: Gastroenterology;;    Allergies: Metronidazole and Penicillin g  Medications: Prior to Admission medications   Medication Sig Start Date End Date  Taking? Authorizing Provider  acetaminophen (TYLENOL) 325 MG tablet Take 2 tablets (650 mg total) by mouth every 6 (six) hours as needed for mild pain (or Fever >/= 101). 05/26/21   Loletha Grayer, MD  apixaban (ELIQUIS) 2.5 MG TABS tablet Take 1 tablet (2.5 mg total) by mouth 2 (two) times daily. 08/20/21   Minna Merritts, MD  benzonatate (TESSALON) 200 MG capsule Take 1 capsule (200 mg total) by mouth 2 (two) times daily as needed for cough. Patient not taking: Reported on 09/10/2021 05/21/21   [name], MD  cephALEXin (KEFLEX) 500 MG capsule Take 1 capsule (500 mg total) by mouth 2 (two) times daily for 7 days. 11/08/21 11/15/21  Tsosie Billing D, PA-C  ferrous sulfate 325 (65 FE) MG EC tablet Take 325 mg by mouth daily.    [provider]  fluticasone Asencion Islam) 50 MCG/ACT nasal spray USE 2 SPRAYS IN EACH NOSTRIL DAILY 12/11/20   [name], MD  hydrocortisone (ANUSOL-HC) 2.5 % rectal cream Place 1 application rectally 2 (two) times daily. 06/26/21   Flinchum, Kelby Aline, FNP  hydrocortisone (ANUSOL-HC) 25 MG suppository Place 1 suppository (25 mg total) rectally 2 (two) times daily as needed for hemorrhoids or anal itching. Patient not taking: Reported on 09/10/2021 07/17/21   [name], MD  levothyroxine (SYNTHROID) 50 MCG tablet Take 1 tablet (50 mcg total) by mouth daily. 11/15/20   [name], MD  magnesium chloride (SLOW-MAG) 64 MG TBEC SR tablet Take 2 tablets (128 mg total) by mouth daily. 12/18/17   [name], MD  metoprolol tartrate (LOPRESSOR) 25 MG tablet Take 1 tablet (25 mg total) by mouth 2 (two) times daily. 09/07/21   Minna Merritts, MD  nystatin cream (MYCOSTATIN) nystatin 100,000 unit/gram topical cream  APPLY TOPICALLY TO THE AFFECTED AREA TWICE DAILY    [provider]  OLANZapine (ZYPREXA) 2.5 MG tablet Take 1 tablet (2.5 mg total) by mouth at bedtime. Patient not taking: Reported on 09/10/2021 07/17/21   [name], MD  omeprazole (PRILOSEC) 40 MG capsule TAKE 1 CAPSULE DAILY 05/31/21   Minna Merritts, MD  PACERONE 200 MG tablet TAKE 1 TABLET DAILY 11/08/21   Minna Merritts, MD  rosuvastatin (CRESTOR) 20 MG tablet Take 1 tablet (20 mg total) by mouth at bedtime. 08/20/21   Minna Merritts, MD  sodium chloride 1 g tablet Take 1 tablet (1 g total) by mouth 2 (two) times daily with a meal. 05/26/21   Leslye Peer, Richard, MD  Sodium Chloride Flush (NORMAL SALINE FLUSH) 0.9 % SOLN FLUSH ONCE DAILY AS DIRECTED Patient not taking: Reported on 09/10/2021 08/06/21 08/06/22  Criselda Peaches, MD  traMADol (ULTRAM) 50 MG tablet Take 50 mg by mouth every 6 (six) hours as needed. 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  Tobacco Use   Smoking status: Former    Types: Cigarettes    Quit date: 06/04/1951    Years since quitting: 70.4   Smokeless tobacco: Never   Tobacco comments:    Smoked 1945-1953 , up to 2 cigarettes/day  Vaping Use   Vaping Use: Never used  Substance and Sexual Activity   Alcohol use: Yes    Alcohol/week: 1.0 standard drink of alcohol    Types: 1 Glasses of wine per week    Comment: occassional   Drug use: No   Sexual activity: Not Currently  Other Topics Concern   Not on file  Social History Narrative   Has living will    Would want daughter  Santiago Glad to be health care POA   Not sure about DNR--definitely doesn't want prolonged life support   Not sure about tube feeds either (but probably not)         Social Determinants of Health   Financial Resource Strain: Low Risk  (06/26/2021)   Overall Financial Resource Strain (CARDIA)    Difficulty of Paying Living Expenses: Not hard at all  Food Insecurity: No Food Insecurity (06/26/2021)   Hunger Vital Sign    Worried About Running Out of Food in the Last Year: Never true    Ran Out of Food in the Last Year: Never true  Transportation Needs: No Transportation Needs (06/26/2021)   PRAPARE - Hydrologist (Medical): No    Lack of Transportation (Non-Medical): No  Physical Activity: Insufficiently Active (06/26/2021)   Exercise Vital Sign    Days of Exercise per Week: 2 days    Minutes of Exercise per Session: 10 min  Stress: Not on file  Social Connections: Moderately Integrated (06/26/2021)   Social Connection and Isolation Panel [NHANES]    Frequency of Communication with Friends and Family: More than three times a week    Frequency of Social Gatherings with Friends and Family: More than three times a week    Attends Religious Services: More than 4 times per year    Active Member of [employer] or Organizations: Yes    Attends Archivist Meetings: 1 to 4 times per year    Marital Status: Widowed   Vital Signs: There were no vitals taken for this visit.  Physical Exam  General: Alert, sitting in chair, NAD, family bedside Abd: RUQ dressing soiled- removed today and replaced new dressing and new drainage bag. Site with erythematous changes around catheter in circular pattern, no visualized drainage at exit site seen, no purulence or skin breakdown with lighter appearing yeast skin changes more midline and inferior to breast. Bilious fluid in drainage bag without purulence.  Imaging: No  results found.  Labs:  CBC: Recent Labs    06/12/21 1215  06/26/21 1035 07/27/21 1438 09/10/21 1243  WBC 6.6 7.9 11.4* 9.1  HGB 10.3* 11.1* 12.8 13.2  HCT 31.6* 34.4* 38.9 39.7  PLT 265 237.0 281 260.0    COAGS: Recent Labs    05/23/21 0511  INR 1.2    BMP: Recent Labs    05/24/21 2304 05/25/21 0446 05/25/21 0813 05/26/21 0306 06/12/21 1215 06/26/21 1032 07/27/21 1438 09/10/21 1243  NA 126*  --  127* 128* 137 137 137 136  K 4.6  --  4.4 4.6 5.2 4.2 4.7 4.7  CL 101  --  103 104 100 102 103 102  CO2 22  --  21* 21* '22 27 25 28  '$ GLUCOSE 118*  --  121* 108* 89 111* 100* 95  BUN 25*  --  25* 25* 19 30* 37* 27*  CALCIUM 8.9  --  8.7* 9.2 8.7 10.3 10.3 10.0  CREATININE 1.12* 1.05* 1.10* 0.92 0.83 0.87 0.98* 0.90  GFRNONAA 45* 48* 46* 57*  --   --   --   --     LIVER FUNCTION TESTS: Recent Labs    05/25/21 0813 06/12/21 1215 06/26/21 1032 07/09/21 0955 07/27/21 1438  BILITOT 0.9 0.6 0.4 0.3 0.4  AST 97* 102* 43* 43* 34  ALT 88* 135* 62* 54* 39*  ALKPHOS 48 112 83 96  --   PROT 5.0* 5.5* 6.5 7.2 6.7  ALBUMIN 2.4* 3.3* 3.6 4.2  --     Assessment and Plan: This is a 88 year old female with PMHx significant for choledocholithiasis s/p percutaneous cholecystostomy catheter placed by IR 08/16/2018, patient has underwent routine cholecystostomy tube exchanges and family is aware of spyglass procedure option and is thinking it over still. The patient presented today with complaints of increased redness and pain with an odor at catheter site. Site with signs of cellulitis and fungal infection. The patient and family were instructed to continue to put nystatin cream which they have at home on site once daily and keep area dry with daily dressing changes. Keflex '500mg'$  Q12Hrs x 7 days was called into her pharmacy and they were instructed to start this medication today. Patient and family deny any known reactions to Keflex or knowledge of previous PCN rash history. Family instructed to call us back if symptoms worsening or no improvement.  They verbalize understanding of the plan today.  Electronically Signed: Tsosie Billing D 11/08/2021, 1:11 PM   I spent a total of 10 Minutes in face to face in clinical consultation, greater than 50% of which was counseling/coordinating care for cholecystostomy tube site redness.

## 2021-11-08 NOTE — Progress Notes (Signed)
Patient presented today with complaints of redness and odor at GB drain site. Patient was instructed to continue to put nystatin cream on site once daily and keep area dry with daily dressing changes. Keflex '500mg'$  Q12Hrs x 7 days was called into her pharmacy. Patient and family deny any known reactions to Keflex or knowledge of previous PCN rash history. Family instructed to call us back if symptoms worsening or no improvement. They verbalize understanding of the plan today.   Hedy Jacob, PA-C 11/08/2021, 11:39 AM

## 2021-11-13 ENCOUNTER — Other Ambulatory Visit (HOSPITAL_COMMUNITY): Payer: Self-pay

## 2021-11-16 ENCOUNTER — Ambulatory Visit (INDEPENDENT_AMBULATORY_CARE_PROVIDER_SITE_OTHER): Payer: Medicare Other

## 2021-11-16 ENCOUNTER — Encounter: Payer: Self-pay | Admitting: Emergency Medicine

## 2021-11-16 ENCOUNTER — Ambulatory Visit
Admission: EM | Admit: 2021-11-16 | Discharge: 2021-11-16 | Disposition: A | Discharge status: Home / Self Care | Payer: Medicare Other | Attending: Emergency Medicine | Admitting: Emergency Medicine

## 2021-11-16 DIAGNOSIS — R509 Fever, unspecified: Secondary | ICD-10-CM | POA: Diagnosis not present

## 2021-11-16 DIAGNOSIS — R058 Other specified cough: Secondary | ICD-10-CM

## 2021-11-16 DIAGNOSIS — R059 Cough, unspecified: Secondary | ICD-10-CM | POA: Diagnosis not present

## 2021-11-16 DIAGNOSIS — J189 Pneumonia, unspecified organism: Secondary | ICD-10-CM

## 2021-11-16 LAB — POCT RAPID STREP A (OFFICE): Rapid Strep A Screen: NEGATIVE

## 2021-11-16 MED ORDER — ACETAMINOPHEN 325 MG PO TABS
650.0000 mg | ORAL_TABLET | Freq: Once | ORAL | Status: AC
  Administered 2021-11-16: 650 mg via ORAL

## 2021-11-16 MED ORDER — CEFDINIR 300 MG PO CAPS: 300.0000 mg | ORAL_CAPSULE | Freq: Two times a day (BID) | ORAL | 0 refills | Status: AC

## 2021-11-16 MED ORDER — DOXYCYCLINE HYCLATE 100 MG PO CAPS: 100.0000 mg | ORAL_CAPSULE | Freq: Two times a day (BID) | ORAL | 0 refills | Status: AC

## 2021-11-16 NOTE — ED Provider Notes (Signed)
Roderic Palau    CSN: [id number] Arrival date & time: 11/16/21  1221      History   Chief Complaint Chief Complaint  Patient presents with   Fever   Chest Congestion   Cough   Diarrhea   Nausea    HPI Kylie May is a 88 y.o. female.  Accompanied by her daughter and grandson, patient presents with fever, headache, sore throat, congestion, productive cough x2 days.  Her cough is productive of clear sputum.  Tmax 100.6.  Tylenol last given yesterday.  She also had 1 episode of emesis yesterday which was phlegm only.  She has had 2 loose stools today.  Patient is currently on nystatin cream and cephalexin for an infection around her gallbladder drain; cephalexin started on 11/08/2021 x 7 day course.  Patient denies chest pain, shortness of breath, or other symptoms.  Her medical history includes atrial fibrillation, congestive heart failure, dysphagia, GERD.   The history is provided by the patient, a relative and medical records.    Past Medical History:  Diagnosis Date   A-fib ([place])    Acid reflux disease    Arrhythmia    Atrial fibrillation with RVR ([place]) 08/15/2018   Chronic diastolic CHF (congestive heart failure) (HCC)    Compression fracture of lumbar vertebra ([place])    Diverticulitis 01-2008   [facility] [place]   Diverticulosis    Esophageal stricture 2006   Femoral bruit    Right   Fibrocystic breast disease    Hiatal hernia    History of esophageal stricture 06/14/2004   2006 esophageal dilation Schatzki's ring    History of skin cancer 12/29/2008   Basal cell cancer right glabella 07/10/2010 Dr. Evorn Gong, [facility] @ [place] , [place]    Hypercholesterolemia    [place] study LDL goal = < 160   Mitral regurgitation    a. 06/2014 EF 55-60%, elevated end-diastolic pressures, dilated LA at 4.3 cm, mildly dilated RA, severe mitral regurgitation, mild aortic sclerosis without stenosis, mod-severe TR   Mitral valve prolapse    Osteoporosis    Dr Matthew Saras    Pancreatitis 07-2008   Hospitalized    Patella fracture 05/13/2018   Schatzki's ring    Sepsis ([place]) 08/15/2018   Skin cancer    facial x 2. [name]    Patient Active Problem List   Diagnosis Date Noted   Left leg pain 09/10/2021   AKI (acute kidney injury) ([place]) 09/10/2021   Leukocytosis 09/10/2021   Yeast infection 06/27/2021   Pressure injury of buttock, stage 1 06/27/2021   Hemorrhoid prolapse 06/26/2021   Constipation 06/26/2021   Elevated serum creatinine 05/31/2021   Closed fracture of left proximal humerus    Lip laceration    Atrial fibrillation, chronic (HCC)    Hypotension 05/23/2021   Closed fracture of phalanx of finger of left hand 05/22/2021   Hyponatremia 05/21/2021   Hypothyroidism 10/05/2019   Allergic rhinitis 09/17/2019   History of cholecystitis 07/16/2019   Skin lesion 07/16/2019   Contusion of left knee 04/07/2019   Sprain of interphalangeal joint of finger 04/07/2019   Closed fracture of distal end of radius 01/06/2019   Elevated LFTs 11/19/2018   Protein-calorie malnutrition ([place]) 09/02/2018   Anemia 07/13/2018   Fall 10/31/2017   Abdominal aortic atherosclerosis ([place]) 09/16/2017   Rash 09/04/2017   Elevated TSH 09/04/2017   Dysphagia 08/18/2017   Chronic back pain 01/14/2017   Scoliosis 10/14/2016   Frequent falls 04/18/2016   Paroxysmal atrial  fibrillation [facility])    Mitral regurgitation    Chronic diastolic CHF (congestive heart failure) ([place])    Hearing loss 01/09/2010   Hyperlipidemia 08/10/2007   GERD (gastroesophageal reflux disease) 02/22/2007    Past Surgical History:  Procedure Laterality Date   BILIARY STENT PLACEMENT  08/21/2018   Procedure: BILIARY STENT PLACEMENT;  Surgeon: [name], MD;  Location: [facility] ENDOSCOPY;  Service: Gastroenterology;;   COLONOSCOPY  2006   Epidural steroids     x1   ERCP N/A 08/21/2018   Procedure: ENDOSCOPIC RETROGRADE CHOLANGIOPANCREATOGRAPHY (ERCP);  Surgeon: [name], MD;  Location: [place];  Service: Gastroenterology;  Laterality: N/A;   ESOPHAGEAL DILATION  2006   ESOPHAGOGASTRODUODENOSCOPY (EGD) WITH PROPOFOL N/A 11/16/2018   Procedure: ESOPHAGOGASTRODUODENOSCOPY (EGD) WITH PROPOFOL;  Surgeon: [name], MD;  Location[facility] ENDOSCOPY;  Service: Gastroenterology;  Laterality: N/A;   G3 P4 (twins)     IR CHOLANGIOGRAM EXISTING TUBE  08/18/2018   IR EXCHANGE BILIARY DRAIN  11/03/2018   IR EXCHANGE BILIARY DRAIN  12/01/2018   IR EXCHANGE BILIARY DRAIN  05/13/2019   IR EXCHANGE BILIARY DRAIN  06/14/2019   IR EXCHANGE BILIARY DRAIN  07/19/2019   IR EXCHANGE BILIARY DRAIN  08/17/2019   IR EXCHANGE BILIARY DRAIN  09/30/2019   IR EXCHANGE BILIARY DRAIN  12/08/2019   IR EXCHANGE BILIARY DRAIN  01/19/2020   IR EXCHANGE BILIARY DRAIN  03/01/2020   IR EXCHANGE BILIARY DRAIN  04/20/2020   IR EXCHANGE BILIARY DRAIN  07/06/2020   IR EXCHANGE BILIARY DRAIN  10/26/2020   IR EXCHANGE BILIARY DRAIN  01/18/2021   IR EXCHANGE BILIARY DRAIN  03/05/2021   IR EXCHANGE BILIARY DRAIN  05/25/2021   IR EXCHANGE BILIARY DRAIN  08/03/2021   IR EXCHANGE BILIARY DRAIN  09/28/2021   IR PERC CHOLECYSTOSTOMY  08/16/2018   IR RADIOLOGIST EVAL & MGMT  11/08/2021   REMOVAL OF STONES  08/21/2018   Procedure: REMOVAL OF STONES;  Surgeon: [name], MD;  Location: [place];  Service: Gastroenterology;;   SKIN CANCER EXCISION     SPHINCTEROTOMY  08/21/2018   Procedure: Joan Mayans;  Surgeon: [name], MD;  Location: [facility] ENDOSCOPY;  Service: Gastroenterology;;    OB History   No obstetric history on file.      Home Medications    Prior to Admission medications   Medication Sig Start Date End Date Taking? Authorizing Provider  cefdinir (OMNICEF) 300 MG capsule Take 1 capsule (300 mg total) by mouth 2 (two) times daily for 7 days. 11/16/21 11/23/21 Yes Sharion Balloon, NP  doxycycline (VIBRAMYCIN) 100 MG capsule Take 1 capsule (100 mg total) by mouth 2 (two) times daily for 7 days. 11/16/21 11/23/21  Yes Sharion Balloon, NP  acetaminophen (TYLENOL) 325 MG tablet Take 2 tablets (650 mg total) by mouth every 6 (six) hours as needed for mild pain (or Fever >/= 101). 05/26/21   Loletha Grayer, MD  apixaban (ELIQUIS) 2.5 MG TABS tablet Take 1 tablet (2.5 mg total) by mouth 2 (two) times daily. 08/20/21   Minna Merritts, MD  benzonatate (TESSALON) 200 MG capsule Take 1 capsule (200 mg total) by mouth 2 (two) times daily as needed for cough. Patient not taking: Reported on 09/10/2021 05/21/21   [name], MD  ferrous sulfate 325 (65 FE) MG EC tablet Take 325 mg by mouth daily.    [provider]  fluticasone (FLONASE) 50 MCG/ACT nasal spray USE 2 SPRAYS IN EACH NOSTRIL DAILY 12/11/20  [name], MD  hydrocortisone (ANUSOL-HC) 2.5 % rectal cream Place 1 application rectally 2 (two) times daily. 06/26/21   Flinchum, Kelby Aline, FNP  hydrocortisone (ANUSOL-HC) 25 MG suppository Place 1 suppository (25 mg total) rectally 2 (two) times daily as needed for hemorrhoids or anal itching. Patient not taking: Reported on 09/10/2021 07/17/21   [name], MD  levothyroxine (SYNTHROID) 50 MCG tablet Take 1 tablet (50 mcg total) by mouth daily. 11/15/20   [name], MD  magnesium chloride (SLOW-MAG) 64 MG TBEC SR tablet Take 2 tablets (128 mg total) by mouth daily. 12/18/17   [name], MD  metoprolol tartrate (LOPRESSOR) 25 MG tablet Take 1 tablet (25 mg total) by mouth 2 (two) times daily. 09/07/21   Minna Merritts, MD  nystatin cream (MYCOSTATIN) nystatin 100,000 unit/gram topical cream  APPLY TOPICALLY TO THE AFFECTED AREA TWICE DAILY    [provider]  OLANZapine (ZYPREXA) 2.5 MG tablet Take 1 tablet (2.5 mg total) by mouth at bedtime. Patient not taking: Reported on 09/10/2021 07/17/21   [name], MD  omeprazole (PRILOSEC) 40 MG capsule TAKE 1 CAPSULE DAILY 05/31/21   Minna Merritts, MD  PACERONE 200 MG tablet TAKE 1 TABLET DAILY 11/08/21    Minna Merritts, MD  rosuvastatin (CRESTOR) 20 MG tablet Take 1 tablet (20 mg total) by mouth at bedtime. 08/20/21   Minna Merritts, MD  sodium chloride 1 g tablet Take 1 tablet (1 g total) by mouth 2 (two) times daily with a meal. 05/26/21   Leslye Peer, Richard, MD  Sodium Chloride Flush (NORMAL SALINE FLUSH) 0.9 % SOLN FLUSH ONCE DAILY AS DIRECTED Patient not taking: Reported on 09/10/2021 08/06/21 08/06/22  Criselda Peaches, MD  traMADol (ULTRAM) 50 MG tablet Take 50 mg by mouth every 6 (six) hours as needed. Patient not taking: Reported on 09/10/2021 05/22/21   [provider]    Family History Family History  Problem Relation Age of Onset   Breast cancer Maternal Aunt    Hepatitis Daughter        C   Lung disease Mother        lung tumor   Heart attack Daughter 70       S/P stents   Liver cancer Daughter    Heart attack Sister 83   Diabetes Neg Hx    Stroke Neg Hx     Social History Social History   Tobacco Use   Smoking status: Former    Types: Cigarettes    Quit date: 06/04/1951    Years since quitting: 70.5   Smokeless tobacco: Never   Tobacco comments:    Smoked 1945-1953 , up to 2 cigarettes/day  Vaping Use   Vaping Use: Never used  Substance Use Topics   Alcohol use: Yes    Alcohol/week: 1.0 standard drink of alcohol    Types: 1 Glasses of wine per week    Comment: occassional   Drug use: No     Allergies   Metronidazole and Penicillin g   Review of Systems Review of Systems  Constitutional:  Positive for fever. Negative for chills.  HENT:  Positive for congestion and sore throat. Negative for ear pain.   Respiratory:  Positive for cough. Negative for shortness of breath.   Cardiovascular:  Negative for chest pain and palpitations.  Gastrointestinal:  Positive for diarrhea and vomiting. Negative for abdominal pain.  Neurological:  Positive for headaches.  All other systems reviewed  and are negative.    Physical Exam Triage Vital  Signs ED Triage Vitals  Enc Vitals Group     BP      Pulse      Resp      Temp      Temp src      SpO2      Weight      Height      Head Circumference      Peak Flow      Pain Score      Pain Loc      Pain Edu?      Excl. in [place]?    No data found.  Updated Vital Signs BP 125/73   Pulse 63   Temp 98.5 F (36.9 C)   Resp 18   SpO2 93%   Visual Acuity Right Eye Distance:   Left Eye Distance:   Bilateral Distance:    Right Eye Near:   Left Eye Near:    Bilateral Near:     Physical Exam Vitals and nursing note reviewed.  Constitutional:      General: She is not in acute distress.    Appearance: She is well-developed. She is not ill-appearing.  HENT:     Right Ear: Tympanic membrane normal.     Left Ear: Tympanic membrane normal.     Nose: Nose normal.     Mouth/Throat:     Mouth: Mucous membranes are moist.     Pharynx: Oropharynx is clear.  Cardiovascular:     Rate and Rhythm: Normal rate and regular rhythm.     Heart sounds: Normal heart sounds.  Pulmonary:     Effort: Pulmonary effort is normal. No respiratory distress.     Breath sounds: Normal breath sounds.     Comments: No respiratory distress.  Abdominal:     Palpations: Abdomen is soft.     Tenderness: There is no abdominal tenderness.  Musculoskeletal:     Cervical back: Neck supple.  Skin:    General: Skin is warm and dry.  Neurological:     Mental Status: She is alert.     Comments: Ambulatory with walker.  Psychiatric:        Mood and Affect: Mood normal.        Behavior: Behavior normal.      UC Treatments / Results  Labs (all labs ordered are listed, but only abnormal results are displayed) Labs Reviewed  COVID-19, FLU A+B AND RSV  POCT RAPID STREP A (OFFICE)    EKG   Radiology DG Chest 2 View  Result Date: 11/16/2021 CLINICAL DATA:  Cough, fever EXAM: CHEST - 2 VIEW COMPARISON:  05/22/2021 FINDINGS: The heart size and mediastinal contours are within normal limits. Aortic  atherosclerosis. Streaky opacity in the left lung base. Scoliotic spinal curvature. Chronic posttraumatic deformity of the proximal left humerus. IMPRESSION: Streaky opacity in the left lung base, which may represent atelectasis versus pneumonia. Electronically Signed   By: Davina Poke D.O.   On: 11/16/2021 13:14    Procedures Procedures (including critical care time)  Medications Ordered in UC Medications  acetaminophen (TYLENOL) tablet 650 mg (650 mg Oral Given 11/16/21 1300)    Initial Impression / Assessment and Plan / UC Course  I have reviewed the triage vital signs and the nursing notes.  Pertinent labs & imaging results that were available during my care of the patient were reviewed by me and considered in my medical decision making (see chart for  details).   LLL pneumonia, productive cough, fever.  Patient and her family decline transfer to the ED.  Chest x-ray shows left lower lobe pneumonia.  COVID, Flu, RSV pending.  Rapid strep negative.  Patient is currently on cephalexin for a skin infection at the site of her gallbladder drain; grandson reports she has one day left of this medication.  Instructed patient and her family to stop the cephalexin.  Starting doxycycline and cefdinir for treatment of pneumonia.  Strict ED precautions discussed at length.  Education provided on room air.  Instructed patient and her family to follow-up with her PCP on Monday.  They agree to plan of care.   Final Clinical Impressions(s) / UC Diagnoses   Final diagnoses:  Productive cough  Fever, unspecified  Pneumonia of left lower lobe due to infectious organism     Discharge Instructions      Stop the cephalexin.    Start the doxycycline and cefdinir.  Follow up with your doctor on Monday.    Go to the emergency department if you have shortness of breath, high fever, or other concerning symptoms.    Your blood pressure is elevated today at 169/80; recheck 151/74; recheck again 125/73.   Please have this rechecked by your primary care provider.            ED Prescriptions     Medication Sig Dispense Auth. Provider   cefdinir (OMNICEF) 300 MG capsule Take 1 capsule (300 mg total) by mouth 2 (two) times daily for 7 days. 14 capsule Sharion Balloon, NP   doxycycline (VIBRAMYCIN) 100 MG capsule Take 1 capsule (100 mg total) by mouth 2 (two) times daily for 7 days. 14 capsule Sharion Balloon, NP      PDMP not reviewed this encounter.   Sharion Balloon, NP 11/16/21 1339

## 2021-11-16 NOTE — Discharge Instructions (Addendum)
Stop the cephalexin.    Start the doxycycline and cefdinir.  Follow up with your doctor on Monday.    Go to the emergency department if you have shortness of breath, high fever, or other concerning symptoms.    Your blood pressure is elevated today at 169/80; recheck 151/74; recheck again 125/73.  Please have this rechecked by your primary care provider.

## 2021-11-16 NOTE — ED Triage Notes (Signed)
Pt presents with cough, chest congestion, fever, HA and diarrhea x 2 days.

## 2021-11-18 LAB — COVID-19, FLU A+B AND RSV
Influenza A, NAA: NOT DETECTED
Influenza B, NAA: NOT DETECTED
RSV, NAA: NOT DETECTED
SARS-CoV-2, NAA: NOT DETECTED

## 2021-11-19 ENCOUNTER — Telehealth: Payer: Self-pay | Admitting: Family Medicine

## 2021-11-19 ENCOUNTER — Ambulatory Visit (INDEPENDENT_AMBULATORY_CARE_PROVIDER_SITE_OTHER): Payer: Medicare Other | Admitting: Family Medicine

## 2021-11-19 VITALS — BP 124/74 | HR 66 | Temp 98.8°F | Resp 14 | Ht 60.0 in | Wt 106.6 lb

## 2021-11-19 DIAGNOSIS — J189 Pneumonia, unspecified organism: Secondary | ICD-10-CM | POA: Diagnosis not present

## 2021-11-19 NOTE — Progress Notes (Signed)
Tommi Rumps, MD Phone: [phone]  Kylie May is a 88 y.o. female who presents today for follow-up.  Pneumonia: Patient was evaluated at urgent care on 11/16/2021 for headache, sore throat, fever, congestion, and cough.  She had a chest x-ray that was concerning for a left lower lobe pneumonia.  They started her on cefdinir and doxycycline.  Her family reports she has been improving since starting antibiotics.  She has not had any recurrent fevers.  She has no shortness of breath.  She is generally feeling better though continues to cough quite a bit.  They have been monitoring her oxygen and note that has been okay.  She has been using a spirometer as well.  Prior to starting on antibiotics she was also having some dry heaving though that has resolved as well.  The family reports that the patient has been sleeping in a recliner for the last 6 or so months since she fractured her left proximal humerus.  Once she gets over her current respiratory illness they plan to transition her to a bed and wonder about getting a hospital bed.  They note they may prefer to use a normal bed that is able to elevate the head of the bed.  The family notes that the patient was approved for a hospital bed after her hospitalization in December 2022.  Social History   Tobacco Use  Smoking Status Former   Types: Cigarettes   Quit date: 06/04/1951   Years since quitting: 70.5  Smokeless Tobacco Never  Tobacco Comments   Smoked 1945-1953 , up to 2 cigarettes/day    Current Outpatient Medications on File Prior to Visit  Medication Sig Dispense Refill   acetaminophen (TYLENOL) 325 MG tablet Take 2 tablets (650 mg total) by mouth every 6 (six) hours as needed for mild pain (or Fever >/= 101).     apixaban (ELIQUIS) 2.5 MG TABS tablet Take 1 tablet (2.5 mg total) by mouth 2 (two) times daily. 180 tablet 2   cefdinir (OMNICEF) 300 MG capsule Take 1 capsule (300 mg total) by mouth 2 (two) times daily for 7  days. 14 capsule 0   doxycycline (VIBRAMYCIN) 100 MG capsule Take 1 capsule (100 mg total) by mouth 2 (two) times daily for 7 days. 14 capsule 0   ferrous sulfate 325 (65 FE) MG EC tablet Take 325 mg by mouth daily.     fluticasone (FLONASE) 50 MCG/ACT nasal spray USE 2 SPRAYS IN EACH NOSTRIL DAILY 16 g 11   hydrocortisone (ANUSOL-HC) 2.5 % rectal cream Place 1 application rectally 2 (two) times daily. 30 g 0   magnesium chloride (SLOW-MAG) 64 MG TBEC SR tablet Take 2 tablets (128 mg total) by mouth daily. 60 tablet 3   metoprolol tartrate (LOPRESSOR) 25 MG tablet Take 1 tablet (25 mg total) by mouth 2 (two) times daily. 180 tablet 3   nystatin cream (MYCOSTATIN) nystatin 100,000 unit/gram topical cream  APPLY TOPICALLY TO THE AFFECTED AREA TWICE DAILY     omeprazole (PRILOSEC) 40 MG capsule TAKE 1 CAPSULE DAILY 90 capsule 3   PACERONE 200 MG tablet TAKE 1 TABLET DAILY 30 tablet 7   sodium chloride 1 g tablet Take 1 tablet (1 g total) by mouth 2 (two) times daily with a meal. 60 tablet 0   benzonatate (TESSALON) 200 MG capsule Take 1 capsule (200 mg total) by mouth 2 (two) times daily as needed for cough. (Patient not taking: Reported on 09/10/2021) 20 capsule 0  hydrocortisone (ANUSOL-HC) 25 MG suppository Place 1 suppository (25 mg total) rectally 2 (two) times daily as needed for hemorrhoids or anal itching. (Patient not taking: Reported on 09/10/2021) 14 suppository 1   levothyroxine (SYNTHROID) 50 MCG tablet Take 1 tablet (50 mcg total) by mouth daily. (Patient not taking: Reported on 11/19/2021) 90 tablet 3   OLANZapine (ZYPREXA) 2.5 MG tablet Take 1 tablet (2.5 mg total) by mouth at bedtime. (Patient not taking: Reported on 09/10/2021) 30 tablet 0   rosuvastatin (CRESTOR) 20 MG tablet Take 1 tablet (20 mg total) by mouth at bedtime. (Patient not taking: Reported on 11/19/2021) 90 tablet 3   Sodium Chloride Flush (NORMAL SALINE FLUSH) 0.9 % SOLN FLUSH ONCE DAILY AS DIRECTED (Patient not taking:  Reported on 09/10/2021) 500 mL 3   traMADol (ULTRAM) 50 MG tablet Take 50 mg by mouth every 6 (six) hours as needed. (Patient not taking: Reported on 09/10/2021)     No current facility-administered medications on file prior to visit.     ROS see history of present illness  Objective  Physical Exam Vitals:   11/19/21 1120  BP: 124/74  Pulse: 66  Resp: 14  Temp: 98.8 F (37.1 C)  SpO2: 97%    BP Readings from Last 3 Encounters:  11/19/21 124/74  11/16/21 125/73  09/10/21 100/60   Wt Readings from Last 3 Encounters:  11/19/21 106 lb 9.6 oz (48.4 kg)  09/10/21 101 lb 6.4 oz (46 kg)  07/27/21 96 lb 12.8 oz (43.9 kg)    Physical Exam Constitutional:      General: She is not in acute distress.    Appearance: She is not diaphoretic.  Cardiovascular:     Rate and Rhythm: Normal rate and regular rhythm.     Heart sounds: Normal heart sounds.  Pulmonary:     Effort: Pulmonary effort is normal.     Comments: Scattered crackles bilateral mid and lower lungs Skin:    General: Skin is warm and dry.  Neurological:     Mental Status: She is alert.      Assessment/Plan: Please see individual problem list.  Problem List Items Addressed This Visit     Pneumonia of left lower lobe due to infectious organism - Primary    Patient is currently improving on antibiotics.  Discussed that she needs to finish the cefdinir and doxycycline as prescribed even if she feels completely better.  Discussed it may take a number of weeks for the cough to fully resolve.  Discussed the need for follow-up chest x-ray to ensure that the area of concern has resolved.  We did discuss that if it has not resolved we would proceed with more advanced imaging.  They were advised to contact us if she starts to feel worse again.  Discussed if she develops shortness of breath, cough productive of blood, fever of 103 F or higher or any significantly worsening symptoms she should be evaluated in the emergency  department.      Relevant Orders   DG Chest 2 View   I advised that when the family decides whether or not they want to proceed with a hospital bed they should let us know.   Return in about 4 weeks (around 12/17/2021) for Chest x-ray.   Tommi Rumps, MD [place]

## 2021-11-19 NOTE — Patient Instructions (Signed)
Nice to see you. Please finish your antibiotics. We will get a chest x-ray in about 4 weeks to recheck the area of your pneumonia. If you start to have worsening cough, shortness of breath, fevers, or cough productive of blood please contact us or seek medical attention.

## 2021-11-19 NOTE — Assessment & Plan Note (Signed)
Patient is currently improving on antibiotics.  Discussed that she needs to finish the cefdinir and doxycycline as prescribed even if she feels completely better.  Discussed it may take a number of weeks for the cough to fully resolve.  Discussed the need for follow-up chest x-ray to ensure that the area of concern has resolved.  We did discuss that if it has not resolved we would proceed with more advanced imaging.  They were advised to contact us if she starts to feel worse again.  Discussed if she develops shortness of breath, cough productive of blood, fever of 103 F or higher or any significantly worsening symptoms she should be evaluated in the emergency department.

## 2021-11-19 NOTE — Telephone Encounter (Signed)
I forgot to put in follow-up for this patient to see me back in 3 months.  They scheduled her for chest x-ray in a month as requested though can you contact her or have the front office contact her and get her scheduled for 31-monthfollow-up.  Thanks.

## 2021-11-20 NOTE — Telephone Encounter (Signed)
Message sent to [facility] at the front to call and schedule the patients for a 3 month follow up.  Kylie May,cma

## 2021-11-23 ENCOUNTER — Other Ambulatory Visit: Payer: Self-pay | Admitting: Interventional Radiology

## 2021-11-23 ENCOUNTER — Other Ambulatory Visit: Payer: Medicare Other | Admitting: Radiology

## 2021-11-23 ENCOUNTER — Ambulatory Visit
Admission: RE | Admit: 2021-11-23 | Discharge: 2021-11-23 | Disposition: A | Discharge status: Home / Self Care | Payer: Medicare Other | Source: Ambulatory Visit | Attending: Interventional Radiology | Admitting: Interventional Radiology

## 2021-11-23 DIAGNOSIS — K819 Cholecystitis, unspecified: Secondary | ICD-10-CM

## 2021-11-23 DIAGNOSIS — K8010 Calculus of gallbladder with chronic cholecystitis without obstruction: Secondary | ICD-10-CM | POA: Diagnosis not present

## 2021-11-23 HISTORY — PX: IR EXCHANGE BILIARY DRAIN: IMG6046

## 2021-11-23 MED ORDER — LIDOCAINE HCL 1 % IJ SOLN
INTRAMUSCULAR | Status: AC
Start: 2021-11-23 — End: 2021-11-23
  Administered 2021-11-23: 10 mL
  Filled 2021-11-23: qty 20

## 2021-11-23 MED ORDER — IOHEXOL 350 MG/ML SOLN
5.0000 mL | Freq: Once | INTRAVENOUS | Status: AC | PRN
  Administered 2021-11-23: 5 mL

## 2021-12-10 ENCOUNTER — Ambulatory Visit: Payer: Medicare Other | Admitting: Family Medicine

## 2021-12-17 ENCOUNTER — Ambulatory Visit (INDEPENDENT_AMBULATORY_CARE_PROVIDER_SITE_OTHER): Payer: Medicare Other

## 2021-12-17 ENCOUNTER — Other Ambulatory Visit: Payer: Medicare Other

## 2021-12-17 DIAGNOSIS — J189 Pneumonia, unspecified organism: Secondary | ICD-10-CM | POA: Diagnosis not present

## 2021-12-27 ENCOUNTER — Ambulatory Visit
Admission: RE | Admit: 2021-12-27 | Discharge: 2021-12-27 | Disposition: A | Discharge status: Home / Self Care | Payer: Medicare Other | Source: Ambulatory Visit | Attending: Physician Assistant | Admitting: Physician Assistant

## 2021-12-27 ENCOUNTER — Other Ambulatory Visit: Payer: Self-pay | Admitting: Physician Assistant

## 2021-12-27 DIAGNOSIS — Z8719 Personal history of other diseases of the digestive system: Secondary | ICD-10-CM | POA: Insufficient documentation

## 2021-12-27 DIAGNOSIS — Z4659 Encounter for fitting and adjustment of other gastrointestinal appliance and device: Secondary | ICD-10-CM | POA: Diagnosis not present

## 2021-12-27 DIAGNOSIS — Z434 Encounter for attention to other artificial openings of digestive tract: Secondary | ICD-10-CM | POA: Diagnosis not present

## 2021-12-27 HISTORY — PX: IR EXCHANGE BILIARY DRAIN: IMG6046

## 2021-12-27 MED ORDER — IOHEXOL 350 MG/ML SOLN
6.0000 mL | Freq: Once | INTRAVENOUS | Status: AC | PRN
  Administered 2021-12-27: 6 mL

## 2021-12-27 MED ORDER — LIDOCAINE HCL 1 % IJ SOLN
INTRAMUSCULAR | Status: AC
Start: 2021-12-27 — End: 2021-12-27
  Administered 2021-12-27: 2 mL
  Filled 2021-12-27: qty 20

## 2021-12-27 NOTE — Procedures (Signed)
Pre procedural Dx: Chronic chole tube Post procedural Dx: Same  Successful fluoroscopic guided exchange, repositioning and up sizing of now 12 French cholecystostomy tube.   EBL: None Complications: None immediate  PLAN:   - The patient's cholecystostomy tube was reconnected to a gravity bag.   - Routine exchange in 8 weeks with consideration for continued up sizing as indicated.   Ronny Bacon, MD Pager #: [phone]

## 2022-01-08 ENCOUNTER — Other Ambulatory Visit (HOSPITAL_COMMUNITY): Payer: Self-pay

## 2022-01-25 ENCOUNTER — Ambulatory Visit: Payer: Medicare Other | Admitting: Radiology

## 2022-02-04 ENCOUNTER — Other Ambulatory Visit: Payer: Self-pay | Admitting: Family Medicine

## 2022-02-04 DIAGNOSIS — E039 Hypothyroidism, unspecified: Secondary | ICD-10-CM

## 2022-02-18 DIAGNOSIS — D2262 Melanocytic nevi of left upper limb, including shoulder: Secondary | ICD-10-CM | POA: Diagnosis not present

## 2022-02-18 DIAGNOSIS — B372 Candidiasis of skin and nail: Secondary | ICD-10-CM | POA: Diagnosis not present

## 2022-02-18 DIAGNOSIS — D225 Melanocytic nevi of trunk: Secondary | ICD-10-CM | POA: Diagnosis not present

## 2022-02-18 DIAGNOSIS — L570 Actinic keratosis: Secondary | ICD-10-CM | POA: Diagnosis not present

## 2022-02-18 DIAGNOSIS — D2261 Melanocytic nevi of right upper limb, including shoulder: Secondary | ICD-10-CM | POA: Diagnosis not present

## 2022-02-18 DIAGNOSIS — D2272 Melanocytic nevi of left lower limb, including hip: Secondary | ICD-10-CM | POA: Diagnosis not present

## 2022-02-18 DIAGNOSIS — Z85828 Personal history of other malignant neoplasm of skin: Secondary | ICD-10-CM | POA: Diagnosis not present

## 2022-02-20 ENCOUNTER — Other Ambulatory Visit: Payer: Self-pay | Admitting: Interventional Radiology

## 2022-02-20 ENCOUNTER — Ambulatory Visit
Admission: RE | Admit: 2022-02-20 | Discharge: 2022-02-20 | Disposition: A | Discharge status: Home / Self Care | Payer: Medicare Other | Source: Ambulatory Visit | Attending: Interventional Radiology | Admitting: Interventional Radiology

## 2022-02-20 DIAGNOSIS — Z434 Encounter for attention to other artificial openings of digestive tract: Secondary | ICD-10-CM | POA: Diagnosis not present

## 2022-02-20 DIAGNOSIS — K819 Cholecystitis, unspecified: Secondary | ICD-10-CM

## 2022-02-20 HISTORY — PX: IR EXCHANGE BILIARY DRAIN: IMG6046

## 2022-02-20 MED ORDER — IOHEXOL 300 MG/ML  SOLN
4.0000 mL | Freq: Once | INTRAMUSCULAR | Status: AC | PRN
  Administered 2022-02-20: 4 mL

## 2022-02-20 MED ORDER — LIDOCAINE HCL 1 % IJ SOLN
INTRAMUSCULAR | Status: AC
  Administered 2022-02-20: 2 mL
  Filled 2022-02-20: qty 20

## 2022-02-21 ENCOUNTER — Ambulatory Visit: Payer: Medicare Other | Admitting: Radiology

## 2022-02-22 ENCOUNTER — Other Ambulatory Visit (HOSPITAL_COMMUNITY): Payer: Self-pay

## 2022-02-22 MED ORDER — NORMAL SALINE FLUSH 0.9 % IV SOLN
INTRAVENOUS | 3 refills | Status: DC
  Filled 2022-02-22: qty 900, 90d supply, fill #0
  Filled 2022-05-24: qty 900, 90d supply, fill #1

## 2022-03-29 ENCOUNTER — Ambulatory Visit (INDEPENDENT_AMBULATORY_CARE_PROVIDER_SITE_OTHER): Payer: Medicare Other | Admitting: Family

## 2022-03-29 ENCOUNTER — Encounter: Payer: Self-pay | Admitting: Family

## 2022-03-29 ENCOUNTER — Ambulatory Visit (INDEPENDENT_AMBULATORY_CARE_PROVIDER_SITE_OTHER): Payer: Medicare Other

## 2022-03-29 VITALS — BP 116/80 | HR 88 | Temp 98.1°F | Ht 59.0 in | Wt 108.6 lb

## 2022-03-29 DIAGNOSIS — J40 Bronchitis, not specified as acute or chronic: Secondary | ICD-10-CM | POA: Insufficient documentation

## 2022-03-29 DIAGNOSIS — J189 Pneumonia, unspecified organism: Secondary | ICD-10-CM | POA: Diagnosis not present

## 2022-03-29 DIAGNOSIS — R059 Cough, unspecified: Secondary | ICD-10-CM | POA: Diagnosis not present

## 2022-03-29 MED ORDER — BENZONATATE 100 MG PO CAPS: 100.0000 mg | ORAL_CAPSULE | Freq: Three times a day (TID) | ORAL | 1 refills | Status: DC | PRN

## 2022-03-29 MED ORDER — DOXYCYCLINE HYCLATE 100 MG PO TABS: 100.0000 mg | ORAL_TABLET | Freq: Two times a day (BID) | ORAL | 0 refills | Status: AC

## 2022-03-29 NOTE — Progress Notes (Signed)
Subjective:    Patient ID: Kylie May, female    DOB: 16-Oct-1921, 88 y.o.   MRN: [id number]  CC: Kylie May is a 88 y.o. female who presents today for an acute visit.    HPI: Accompanied by her daughter  Complains of productive cough 6 days, worsening  Endorses nasal congestion  DOE at baseline. She is using walker No fever, SOB, cp, leg swelling  Home covid test negative    She lives at home, family cares for her.      Takes probiotic daily   History of pneumonia, hypothyroidism, GERD  History of atrial fibrillation with RVR and compliant with Eliquis 2.5 mg, metoprolol '25mg'$ , pacerone '200mg'$   Remote smoking history.  No h/o  lung disease  Crt 0.90 , 6 months ago  Chest x-ray 12/2021 to follow-up pneumonia, no active cardiopulmonary disease Treated with doxycycline, cefdinir 11/2021  HISTORY:  Past Medical History:  Diagnosis Date   A-fib ([place])    Acid reflux disease    Arrhythmia    Atrial fibrillation with RVR ([place]) 08/15/2018   Chronic diastolic CHF (congestive heart failure) (HCC)    Compression fracture of lumbar vertebra ([place])    Diverticulitis 01-2008   GI [place]   Diverticulosis    Esophageal stricture 2006   Femoral bruit    Right   Fibrocystic breast disease    Hiatal hernia    History of esophageal stricture 06/14/2004   2006 esophageal dilation Schatzki's ring    History of skin cancer 12/29/2008   Basal cell cancer right glabella 07/10/2010 Dr. Evorn Gong, [facility] @ [place] , [place]    Hypercholesterolemia    [place] study LDL goal = < 160   Mitral regurgitation    a. 06/2014 EF 55-60%, elevated end-diastolic pressures, dilated LA at 4.3 cm, mildly dilated RA, severe mitral regurgitation, mild aortic sclerosis without stenosis, mod-severe TR   Mitral valve prolapse    Osteoporosis    Dr Matthew Saras   Pancreatitis 07-2008   Hospitalized    Patella fracture 05/13/2018   Schatzki's ring    Sepsis ([place]) 08/15/2018   Skin cancer     facial x 2. [name]   Past Surgical History:  Procedure Laterality Date   BILIARY STENT PLACEMENT  08/21/2018   Procedure: BILIARY STENT PLACEMENT;  Surgeon: [name], MD;  Location: [facility] ENDOSCOPY;  Service: Gastroenterology;;   COLONOSCOPY  2006   Epidural steroids     x1   ERCP N/A 08/21/2018   Procedure: ENDOSCOPIC RETROGRADE CHOLANGIOPANCREATOGRAPHY (ERCP);  Surgeon: [name], MD;  Location: [facility];  Service: Gastroenterology;  Laterality: N/A;   ESOPHAGEAL DILATION  2006   ESOPHAGOGASTRODUODENOSCOPY (EGD) WITH PROPOFOL N/A 11/16/2018   Procedure: ESOPHAGOGASTRODUODENOSCOPY (EGD) WITH PROPOFOL;  Surgeon: [name], MD;  Location: [facility] ENDOSCOPY;  Service: Gastroenterology;  Laterality: N/A;   G3 P4 (twins)     IR CHOLANGIOGRAM EXISTING TUBE  08/18/2018   IR EXCHANGE BILIARY DRAIN  11/03/2018   IR EXCHANGE BILIARY DRAIN  12/01/2018   IR EXCHANGE BILIARY DRAIN  05/13/2019   IR EXCHANGE BILIARY DRAIN  06/14/2019   IR EXCHANGE BILIARY DRAIN  07/19/2019   IR EXCHANGE BILIARY DRAIN  08/17/2019   IR EXCHANGE BILIARY DRAIN  09/30/2019   IR EXCHANGE BILIARY DRAIN  12/08/2019   IR EXCHANGE BILIARY DRAIN  01/19/2020   IR EXCHANGE BILIARY DRAIN  03/01/2020   IR EXCHANGE BILIARY DRAIN  04/20/2020   IR EXCHANGE BILIARY DRAIN  07/06/2020   IR  EXCHANGE BILIARY DRAIN  10/26/2020   IR EXCHANGE BILIARY DRAIN  01/18/2021   IR EXCHANGE BILIARY DRAIN  03/05/2021   IR EXCHANGE BILIARY DRAIN  05/25/2021   IR EXCHANGE BILIARY DRAIN  08/03/2021   IR EXCHANGE BILIARY DRAIN  09/28/2021   IR EXCHANGE BILIARY DRAIN  11/23/2021   IR EXCHANGE BILIARY DRAIN  12/27/2021   IR EXCHANGE BILIARY DRAIN  02/20/2022   IR PERC CHOLECYSTOSTOMY  08/16/2018   IR RADIOLOGIST EVAL & MGMT  11/08/2021   REMOVAL OF STONES  08/21/2018   Procedure: REMOVAL OF STONES;  Surgeon: [name], MD;  Location: [facility];  Service: Gastroenterology;;   SKIN CANCER EXCISION     SPHINCTEROTOMY  08/21/2018   Procedure: SPHINCTEROTOMY;   Surgeon: [name], MD;  Location: [facility] ENDOSCOPY;  Service: Gastroenterology;;   Family History  Problem Relation Age of Onset   Breast cancer Maternal Aunt    Hepatitis Daughter        C   Lung disease Mother        lung tumor   Heart attack Daughter 77       S/P stents   Liver cancer Daughter    Heart attack Sister 59   Diabetes Neg Hx    Stroke Neg Hx     Allergies: Metronidazole and Penicillin g Current Outpatient Medications on File Prior to Visit  Medication Sig Dispense Refill   acetaminophen (TYLENOL) 325 MG tablet Take 2 tablets (650 mg total) by mouth every 6 (six) hours as needed for mild pain (or Fever >/= 101).     apixaban (ELIQUIS) 2.5 MG TABS tablet Take 1 tablet (2.5 mg total) by mouth 2 (two) times daily. 180 tablet 2   ferrous sulfate 325 (65 FE) MG EC tablet Take 325 mg by mouth daily.     fluticasone (FLONASE) 50 MCG/ACT nasal spray USE 2 SPRAYS IN EACH NOSTRIL DAILY 16 g 11   hydrocortisone (ANUSOL-HC) 2.5 % rectal cream Place 1 application rectally 2 (two) times daily. 30 g 0   levothyroxine (SYNTHROID) 50 MCG tablet TAKE 1 TABLET DAILY 90 tablet 3   magnesium chloride (SLOW-MAG) 64 MG TBEC SR tablet Take 2 tablets (128 mg total) by mouth daily. 60 tablet 3   metoprolol tartrate (LOPRESSOR) 25 MG tablet Take 1 tablet (25 mg total) by mouth 2 (two) times daily. 180 tablet 3   nystatin cream (MYCOSTATIN) nystatin 100,000 unit/gram topical cream  APPLY TOPICALLY TO THE AFFECTED AREA TWICE DAILY     omeprazole (PRILOSEC) 40 MG capsule TAKE 1 CAPSULE DAILY 90 capsule 3   PACERONE 200 MG tablet TAKE 1 TABLET DAILY 30 tablet 7   rosuvastatin (CRESTOR) 20 MG tablet Take 1 tablet (20 mg total) by mouth at bedtime. 90 tablet 3   sodium chloride 1 g tablet Take 1 tablet (1 g total) by mouth 2 (two) times daily with a meal. 60 tablet 0   Sodium Chloride Flush (NORMAL SALINE FLUSH) 0.9 % SOLN FLUSH ONCE DAILY AS DIRECTED 1000 mL 3   hydrocortisone (ANUSOL-HC) 25 MG  suppository Place 1 suppository (25 mg total) rectally 2 (two) times daily as needed for hemorrhoids or anal itching. (Patient not taking: Reported on 09/10/2021) 14 suppository 1   OLANZapine (ZYPREXA) 2.5 MG tablet Take 1 tablet (2.5 mg total) by mouth at bedtime. (Patient not taking: Reported on 09/10/2021) 30 tablet 0   traMADol (ULTRAM) 50 MG tablet Take 50 mg by mouth every 6 (six) hours as needed. (Patient  not taking: Reported on 09/10/2021)     No current facility-administered medications on file prior to visit.    Social History   Tobacco Use   Smoking status: Former    Types: Cigarettes    Quit date: 06/04/1951    Years since quitting: 70.8   Smokeless tobacco: Never   Tobacco comments:    Smoked 1945-1953 , up to 2 cigarettes/day  Vaping Use   Vaping Use: Never used  Substance Use Topics   Alcohol use: Yes    Alcohol/week: 1.0 standard drink of alcohol    Types: 1 Glasses of wine per week    Comment: occassional   Drug use: No    Review of Systems  Constitutional:  Negative for chills and fever.  HENT:  Positive for congestion. Negative for sinus pressure and sore throat.   Respiratory:  Negative for cough (doe at baseline), shortness of breath and wheezing.   Cardiovascular:  Negative for chest pain, palpitations and leg swelling.  Gastrointestinal:  Negative for nausea and vomiting.      Objective:    BP 116/80 (BP Location: Left Arm, Patient Position: Sitting, Cuff Size: Normal)   Pulse 88   Temp 98.1 F (36.7 C) (Oral)   Ht '4\' 11"'$  (1.499 m)   Wt 108 lb 9.6 oz (49.3 kg)   SpO2 98%   BMI 21.93 kg/m    Physical Exam Vitals reviewed.  Constitutional:      Appearance: She is well-developed.  HENT:     Head: Normocephalic and atraumatic.     Right Ear: Hearing, tympanic membrane, ear canal and external ear normal. No decreased hearing noted. No drainage, swelling or tenderness. No middle ear effusion. No foreign body. Tympanic membrane is not erythematous or  bulging.     Left Ear: Hearing, tympanic membrane, ear canal and external ear normal. No decreased hearing noted. No drainage, swelling or tenderness.  No middle ear effusion. No foreign body. Tympanic membrane is not erythematous or bulging.     Nose: Nose normal. No rhinorrhea.     Right Sinus: No maxillary sinus tenderness or frontal sinus tenderness.     Left Sinus: No maxillary sinus tenderness or frontal sinus tenderness.     Mouth/Throat:     Pharynx: Uvula midline. No oropharyngeal exudate or posterior oropharyngeal erythema.     Tonsils: No tonsillar abscesses.  Eyes:     Conjunctiva/sclera: Conjunctivae normal.  Cardiovascular:     Rate and Rhythm: Regular rhythm.     Pulses: Normal pulses.     Heart sounds: Normal heart sounds.  Pulmonary:     Effort: Pulmonary effort is normal.     Breath sounds: Normal breath sounds. No wheezing, rhonchi or rales.  Lymphadenopathy:     Head:     Right side of head: No submental, submandibular, tonsillar, preauricular, posterior auricular or occipital adenopathy.     Left side of head: No submental, submandibular, tonsillar, preauricular, posterior auricular or occipital adenopathy.     Cervical: No cervical adenopathy.  Skin:    General: Skin is warm and dry.  Neurological:     Mental Status: She is alert.  Psychiatric:        Speech: Speech normal.        Behavior: Behavior normal.        Thought Content: Thought content normal.        Assessment & Plan:   Problem List Items Addressed This Visit       Respiratory  Bronchitis - Primary    Patient well-appearing today and nontoxic in appearance.  She is afebrile.  Duration 6 days and cough and congestion worsening.  Daughter, patient , and Iagreed to start antibiotic therapy for concern for bacterial URI.  Pending chest x-ray as she is recently had pneumonia.  Will start doxycycline.  Advised continued use of probiotics.  Have also provided Ladona Ridgel and she will use  Mucinex over-the-counter.  Daughter will let me know how she is doing      Relevant Medications   benzonatate (TESSALON) 100 MG capsule   doxycycline (VIBRA-TABS) 100 MG tablet   Other Relevant Orders   DG Chest 2 View      I have discontinued Karris F. Woodle's benzonatate. I am also having her start on benzonatate and doxycycline. Additionally, I am having her maintain her magnesium chloride, ferrous sulfate, fluticasone, acetaminophen, sodium chloride, omeprazole, hydrocortisone, traMADol, nystatin cream, OLANZapine, hydrocortisone, rosuvastatin, apixaban, metoprolol tartrate, Pacerone, levothyroxine, and Normal Saline Flush.   Meds ordered this encounter  Medications   benzonatate (TESSALON) 100 MG capsule    Sig: Take 1 capsule (100 mg total) by mouth 3 (three) times daily as needed for cough.    Dispense:  20 capsule    Refill:  1    Order Specific Question:   Supervising Provider    Answer:   Deborra Medina L [[id number]]   doxycycline (VIBRA-TABS) 100 MG tablet    Sig: Take 1 tablet (100 mg total) by mouth 2 (two) times daily for 7 days.    Dispense:  14 tablet    Refill:  0    Order Specific Question:   Supervising Provider    Answer:   Crecencio Mc [[id number]]    Return precautions given.   Risks, benefits, and alternatives of the medications and treatment plan prescribed today were discussed, and patient expressed understanding.   Education regarding symptom management and diagnosis given to patient on AVS.  Continue to follow with [name], MD for routine health maintenance.   [name] and I agreed with plan.   Mable Paris, FNP

## 2022-03-29 NOTE — Assessment & Plan Note (Signed)
Patient well-appearing today and nontoxic in appearance.  She is afebrile.  Duration 6 days and cough and congestion worsening.  Daughter, patient , and Iagreed to start antibiotic therapy for concern for bacterial URI.  Pending chest x-ray as she is recently had pneumonia.  Will start doxycycline.  Advised continued use of probiotics.  Have also provided Ladona Ridgel and she will use Mucinex over-the-counter.  Daughter will let me know how she is doing

## 2022-03-29 NOTE — Patient Instructions (Signed)
Very nice meeting you today. I am concerned that you may have had a viral upper respiratory infection which may be starting to be a bacterial upper respiratory infection.  I have sent in doxycycline which is an antibiotic for you to start.  Most importantly continue probiotics daily as you are doing to avoid diarrheal infections while on antibiotic.  You may take over-the-counter plain Mucinex (guaifenesin) to thin thick congestion  I have also prescribed a cough" called Tessalon which she could take 3 times a day as needed.   If you develop shortness of breath chest pain fever or worsening cough, please not hesitate to present to the emergency over the weekend

## 2022-04-08 ENCOUNTER — Telehealth: Payer: Self-pay | Admitting: Cardiovascular Disease

## 2022-04-08 ENCOUNTER — Telehealth: Payer: Self-pay | Admitting: Family Medicine

## 2022-04-08 ENCOUNTER — Other Ambulatory Visit: Payer: Self-pay

## 2022-04-08 DIAGNOSIS — E039 Hypothyroidism, unspecified: Secondary | ICD-10-CM

## 2022-04-08 MED ORDER — LEVOTHYROXINE SODIUM 50 MCG PO TABS: 50.0000 ug | ORAL_TABLET | Freq: Every day | ORAL | 3 refills | Status: DC

## 2022-04-08 NOTE — Telephone Encounter (Signed)
*  STAT* If patient is at the pharmacy, call can be transferred to refill team.   1. Which medications need to be refilled? (please list name of each medication and dose if known)  Eliquis, Omeprazole, Metoprolol, and Pacerone  2. Which pharmacy/location (including street and city if local pharmacy) is medication to be sent to?[facility]  3. Do they need a 30 day or 90 day supply? 90 days and refillks

## 2022-04-08 NOTE — Telephone Encounter (Signed)
Medication sent to pharmacy.  Hershel Corkery,cma  ?

## 2022-04-08 NOTE — Telephone Encounter (Signed)
Pt need refill on SYNTHROID [facility] to [facility]

## 2022-04-10 ENCOUNTER — Other Ambulatory Visit: Payer: Self-pay

## 2022-04-10 DIAGNOSIS — Z23 Encounter for immunization: Secondary | ICD-10-CM | POA: Diagnosis not present

## 2022-04-10 MED ORDER — APIXABAN 2.5 MG PO TABS: 2.5000 mg | ORAL_TABLET | Freq: Two times a day (BID) | ORAL | 2 refills | Status: DC

## 2022-04-10 MED ORDER — AMIODARONE HCL 200 MG PO TABS: 200.0000 mg | ORAL_TABLET | Freq: Every day | ORAL | 0 refills | Status: DC

## 2022-04-10 MED ORDER — METOPROLOL TARTRATE 25 MG PO TABS: 25.0000 mg | ORAL_TABLET | Freq: Two times a day (BID) | ORAL | 0 refills | Status: DC

## 2022-04-10 MED ORDER — OMEPRAZOLE 40 MG PO CPDR: 40.0000 mg | DELAYED_RELEASE_CAPSULE | Freq: Every day | ORAL | 0 refills | Status: DC

## 2022-04-10 NOTE — Telephone Encounter (Signed)
Prescription refill request for Eliquis received. Indication:afib Last office visit:1/23 Scr:0.9 Age: 88 Weight:49.3  kg  Prescription refilled

## 2022-04-10 NOTE — Telephone Encounter (Signed)
Requested Prescriptions   Signed Prescriptions Disp Refills   amiodarone (PACERONE) 200 MG tablet 90 tablet 0    Sig: Take 1 tablet (200 mg total) by mouth daily. PLEASE SCHEDULE OFFICE VISIT FOR FURTHER REFILLS. THANK YOU!    Authorizing Provider: Minna Merritts    Ordering User: Raelene Bott, Xochitl Egle L   metoprolol tartrate (LOPRESSOR) 25 MG tablet 180 tablet 0    Sig: Take 1 tablet (25 mg total) by mouth 2 (two) times daily. PLEASE SCHEDULE OFFICE VISIT FOR FURTHER REFILLS. THANK YOU!    Authorizing Provider: Minna Merritts    Ordering User: Raelene Bott, Sherica Paternostro L   omeprazole (PRILOSEC) 40 MG capsule 90 capsule 0    Sig: Take 1 capsule (40 mg total) by mouth daily. PLEASE SCHEDULE OFFICE VISIT FOR FURTHER REFILLS. THANK YOU!    Authorizing Provider: Minna Merritts    Ordering User: Raelene Bott, Mayling Aber L   Refill request for Eliquis sent to [facility]

## 2022-04-10 NOTE — Telephone Encounter (Signed)
Daughter called to follow-up on these refills.  Daughter stated the patient is running low and will need to have these prescriptions sent to [facility].

## 2022-04-16 NOTE — Progress Notes (Signed)
Patient for IR Biliary Drain Exchange on Wed 04/17/2022, I called and spoke with the patient's daughter on the phone and gave pre-procedure instructions. Pt's daughter was made aware to be here at 11a at the new entrance. Pt's daughter stated understanding.  Called 04/16/2022

## 2022-04-17 ENCOUNTER — Ambulatory Visit
Admission: RE | Admit: 2022-04-17 | Discharge: 2022-04-17 | Disposition: A | Discharge status: Home / Self Care | Payer: Medicare Other | Source: Ambulatory Visit | Attending: Interventional Radiology | Admitting: Interventional Radiology

## 2022-04-17 ENCOUNTER — Other Ambulatory Visit: Payer: Self-pay | Admitting: Interventional Radiology

## 2022-04-17 DIAGNOSIS — Z434 Encounter for attention to other artificial openings of digestive tract: Secondary | ICD-10-CM | POA: Diagnosis not present

## 2022-04-17 DIAGNOSIS — K819 Cholecystitis, unspecified: Secondary | ICD-10-CM

## 2022-04-17 HISTORY — PX: IR EXCHANGE BILIARY DRAIN: IMG6046

## 2022-04-17 MED ORDER — LIDOCAINE HCL 1 % IJ SOLN
INTRAMUSCULAR | Status: AC
  Administered 2022-04-17: 4 mL
  Filled 2022-04-17: qty 20

## 2022-04-17 MED ORDER — IOHEXOL 300 MG/ML  SOLN
10.0000 mL | Freq: Once | INTRAMUSCULAR | Status: AC | PRN
  Administered 2022-04-17: 10 mL

## 2022-05-24 ENCOUNTER — Other Ambulatory Visit (HOSPITAL_COMMUNITY): Payer: Self-pay

## 2022-06-11 NOTE — Progress Notes (Signed)
Patient for IR Biliary Drain Exc on Wed 06/12/2022, I called and spoke with patient's daughter  on the phone and gave pre-procedure instructions.. Pt's daughter was made aware to have the patient here at 11a at the new entrance. Pt's daughter stated understanding. Called 06/11/2022

## 2022-06-12 ENCOUNTER — Ambulatory Visit
Admission: RE | Admit: 2022-06-12 | Discharge: 2022-06-12 | Disposition: A | Discharge status: Home / Self Care | Payer: Medicare Other | Source: Ambulatory Visit | Attending: Interventional Radiology | Admitting: Interventional Radiology

## 2022-06-12 ENCOUNTER — Ambulatory Visit (INDEPENDENT_AMBULATORY_CARE_PROVIDER_SITE_OTHER): Payer: Medicare Other | Admitting: Family Medicine

## 2022-06-12 ENCOUNTER — Encounter: Payer: Self-pay | Admitting: Family Medicine

## 2022-06-12 VITALS — BP 115/70 | HR 72 | Temp 98.7°F | Ht 59.0 in | Wt 111.0 lb

## 2022-06-12 DIAGNOSIS — N3001 Acute cystitis with hematuria: Secondary | ICD-10-CM

## 2022-06-12 DIAGNOSIS — R309 Painful micturition, unspecified: Secondary | ICD-10-CM | POA: Diagnosis not present

## 2022-06-12 DIAGNOSIS — N390 Urinary tract infection, site not specified: Secondary | ICD-10-CM | POA: Insufficient documentation

## 2022-06-12 LAB — POCT URINALYSIS DIPSTICK
Bilirubin, UA: NEGATIVE
Glucose, UA: NEGATIVE
Ketones, UA: NEGATIVE
Nitrite, UA: POSITIVE
Protein, UA: POSITIVE — AB
Spec Grav, UA: 1.02 (ref 1.010–1.025)
Urobilinogen, UA: 1 E.U./dL
pH, UA: 5.5 (ref 5.0–8.0)

## 2022-06-12 MED ORDER — CEPHALEXIN 500 MG PO CAPS: 500.0000 mg | ORAL_CAPSULE | Freq: Two times a day (BID) | ORAL | 0 refills | Status: DC

## 2022-06-12 NOTE — Progress Notes (Signed)
Tommi Rumps, MD Phone: [phone]  Kylie May is a 89 y.o. female who presents today for f/u.  Urinary frequency: Patient presents with one of her family members she notes she has had increased urinary frequency over the last week.  They are unsure if she has had dysuria or urgency.  She is felt achy and not felt good over this timeframe.  She has had some back aching.  No hematuria or abdominal pain.  She had a negative home COVID test today.  Social History   Tobacco Use  Smoking Status Former   Types: Cigarettes   Quit date: 06/04/1951   Years since quitting: 71.0  Smokeless Tobacco Never  Tobacco Comments   Smoked 1945-1953 , up to 2 cigarettes/day    Current Outpatient Medications on File Prior to Visit  Medication Sig Dispense Refill   acetaminophen (TYLENOL) 325 MG tablet Take 2 tablets (650 mg total) by mouth every 6 (six) hours as needed for mild pain (or Fever >/= 101).     amiodarone (PACERONE) 200 MG tablet Take 1 tablet (200 mg total) by mouth daily. PLEASE SCHEDULE OFFICE VISIT FOR FURTHER REFILLS. THANK YOU! 90 tablet 0   apixaban (ELIQUIS) 2.5 MG TABS tablet Take 1 tablet (2.5 mg total) by mouth 2 (two) times daily. 180 tablet 2   benzonatate (TESSALON) 100 MG capsule Take 1 capsule (100 mg total) by mouth 3 (three) times daily as needed for cough. 20 capsule 1   ferrous sulfate 325 (65 FE) MG EC tablet Take 325 mg by mouth daily.     fluticasone (FLONASE) 50 MCG/ACT nasal spray USE 2 SPRAYS IN EACH NOSTRIL DAILY 16 g 11   hydrocortisone (ANUSOL-HC) 2.5 % rectal cream Place 1 application rectally 2 (two) times daily. 30 g 0   hydrocortisone (ANUSOL-HC) 25 MG suppository Place 1 suppository (25 mg total) rectally 2 (two) times daily as needed for hemorrhoids or anal itching. 14 suppository 1   levothyroxine (SYNTHROID) 50 MCG tablet Take 1 tablet (50 mcg total) by mouth daily. 90 tablet 3   magnesium chloride (SLOW-MAG) 64 MG TBEC SR tablet Take 2 tablets  (128 mg total) by mouth daily. 60 tablet 3   metoprolol tartrate (LOPRESSOR) 25 MG tablet Take 1 tablet (25 mg total) by mouth 2 (two) times daily. PLEASE SCHEDULE OFFICE VISIT FOR FURTHER REFILLS. THANK YOU! 180 tablet 0   nystatin cream (MYCOSTATIN) nystatin 100,000 unit/gram topical cream  APPLY TOPICALLY TO THE AFFECTED AREA TWICE DAILY     OLANZapine (ZYPREXA) 2.5 MG tablet Take 1 tablet (2.5 mg total) by mouth at bedtime. 30 tablet 0   omeprazole (PRILOSEC) 40 MG capsule Take 1 capsule (40 mg total) by mouth daily. PLEASE SCHEDULE OFFICE VISIT FOR FURTHER REFILLS. THANK YOU! 90 capsule 0   rosuvastatin (CRESTOR) 20 MG tablet Take 1 tablet (20 mg total) by mouth at bedtime. 90 tablet 3   sodium chloride 1 g tablet Take 1 tablet (1 g total) by mouth 2 (two) times daily with a meal. 60 tablet 0   Sodium Chloride Flush (NORMAL SALINE FLUSH) 0.9 % SOLN FLUSH ONCE DAILY AS DIRECTED 1000 mL 3   traMADol (ULTRAM) 50 MG tablet Take 50 mg by mouth every 6 (six) hours as needed.     No current facility-administered medications on file prior to visit.     ROS see history of present illness  Objective  Physical Exam Vitals:   06/12/22 1509  BP: 115/70  Pulse: 72  Temp: 98.7 F (37.1 C)  SpO2: 99%    BP Readings from Last 3 Encounters:  06/12/22 115/70  03/29/22 116/80  11/19/21 124/74   Wt Readings from Last 3 Encounters:  06/12/22 111 lb (50.3 kg)  03/29/22 108 lb 9.6 oz (49.3 kg)  11/19/21 106 lb 9.6 oz (48.4 kg)    Physical Exam Constitutional:      General: She is not in acute distress.    Appearance: She is not diaphoretic.  Cardiovascular:     Rate and Rhythm: Normal rate and regular rhythm.     Heart sounds: Normal heart sounds.  Pulmonary:     Effort: Pulmonary effort is normal.     Breath sounds: Normal breath sounds.  Abdominal:     General: Bowel sounds are normal. There is no distension.     Palpations: Abdomen is soft.     Tenderness: There is abdominal  tenderness (Slight discomfort on palpation of lower abdomen).  Skin:    General: Skin is warm and dry.  Neurological:     Mental Status: She is alert.      Assessment/Plan: Please see individual problem list.  Pain with urination -     POCT urinalysis dipstick -     Urine Culture -     Urine Microscopic  Acute cystitis with hematuria Assessment & Plan: Patient's urinalysis is consistent with UTI.  We will treat with Keflex 500 mg twice daily for 7 days.  Will send urine for culture microscopy.  If she has any worsening symptoms or if she is not improving she will contact us right away or get reevaluated.  Orders: -     Cephalexin; Take 1 capsule (500 mg total) by mouth 2 (two) times daily.  Dispense: 14 capsule; Refill: 0    Return if symptoms worsen or fail to improve.   Tommi Rumps, MD [place]

## 2022-06-12 NOTE — Patient Instructions (Signed)
We are treating you with Keflex 500 mg twice daily for your UTI.  We will contact you with your urine culture results.  If you have any worsening symptoms or you are not improving please let us know right away or get evaluated again.

## 2022-06-12 NOTE — Assessment & Plan Note (Signed)
Patient's urinalysis is consistent with UTI.  We will treat with Keflex 500 mg twice daily for 7 days.  Will send urine for culture microscopy.  If she has any worsening symptoms or if she is not improving she will contact us right away or get reevaluated.

## 2022-06-13 LAB — URINALYSIS, MICROSCOPIC ONLY

## 2022-06-15 LAB — URINE CULTURE
MICRO NUMBER:: 14413304
SPECIMEN QUALITY:: ADEQUATE

## 2022-06-17 ENCOUNTER — Ambulatory Visit
Admission: RE | Admit: 2022-06-17 | Discharge: 2022-06-17 | Disposition: A | Discharge status: Home / Self Care | Payer: Medicare Other | Source: Ambulatory Visit | Attending: Interventional Radiology | Admitting: Interventional Radiology

## 2022-06-17 ENCOUNTER — Other Ambulatory Visit: Payer: Self-pay | Admitting: Interventional Radiology

## 2022-06-17 DIAGNOSIS — K819 Cholecystitis, unspecified: Secondary | ICD-10-CM | POA: Diagnosis not present

## 2022-06-17 DIAGNOSIS — Z434 Encounter for attention to other artificial openings of digestive tract: Secondary | ICD-10-CM | POA: Insufficient documentation

## 2022-06-17 HISTORY — PX: IR EXCHANGE BILIARY DRAIN: IMG6046

## 2022-06-17 MED ORDER — IOHEXOL 300 MG/ML  SOLN
8.0000 mL | Freq: Once | INTRAMUSCULAR | Status: AC | PRN
  Administered 2022-06-17: 8 mL

## 2022-06-24 ENCOUNTER — Telehealth: Payer: Self-pay | Admitting: Family Medicine

## 2022-06-24 NOTE — Telephone Encounter (Signed)
Can they come in a give a urine sample? I would like her urine checked prior to starting an antibiotic.

## 2022-06-24 NOTE — Telephone Encounter (Signed)
Pt daughter called in staying that her mom still has those UTI symptoms, vaginal discomfort, urine its off color, is theres anything they can do for now? Maybe put a urine order in? I told them that they have an appt Dr. Ponciano Ort next Monday.

## 2022-06-25 ENCOUNTER — Other Ambulatory Visit: Payer: Self-pay

## 2022-06-25 ENCOUNTER — Other Ambulatory Visit (INDEPENDENT_AMBULATORY_CARE_PROVIDER_SITE_OTHER): Payer: Medicare Other

## 2022-06-25 DIAGNOSIS — R8290 Unspecified abnormal findings in urine: Secondary | ICD-10-CM | POA: Diagnosis not present

## 2022-06-25 DIAGNOSIS — R309 Painful micturition, unspecified: Secondary | ICD-10-CM | POA: Diagnosis not present

## 2022-06-25 LAB — POCT URINALYSIS DIPSTICK
Bilirubin, UA: NEGATIVE
Glucose, UA: NEGATIVE
Ketones, UA: NEGATIVE
Nitrite, UA: NEGATIVE
Protein, UA: POSITIVE — AB
Spec Grav, UA: 1.02 (ref 1.010–1.025)
Urobilinogen, UA: 0.2 E.U./dL
pH, UA: 5.5 (ref 5.0–8.0)

## 2022-06-25 NOTE — Telephone Encounter (Signed)
I called the patient and informed her that she can do a U/a today and she is scheduled.  Kylie May,cma

## 2022-06-26 ENCOUNTER — Telehealth: Payer: Self-pay | Admitting: Family Medicine

## 2022-06-26 DIAGNOSIS — R309 Painful micturition, unspecified: Secondary | ICD-10-CM | POA: Diagnosis not present

## 2022-06-26 LAB — URINALYSIS, MICROSCOPIC ONLY

## 2022-06-26 MED ORDER — CEFDINIR 300 MG PO CAPS: 300.0000 mg | ORAL_CAPSULE | Freq: Every day | ORAL | 0 refills | Status: DC

## 2022-06-26 NOTE — Telephone Encounter (Signed)
Patient's daugther Kylie May called. She wanted Dr Caryl Bis to know that her mother seems a little warm today, she is complaining of nausea, and pain. She wanted to know what she could do for her until the urine culture comes in.

## 2022-06-26 NOTE — Telephone Encounter (Signed)
I went ahead and sent in an antibiotic for her to start on. We will contact them once the culture returns.

## 2022-06-26 NOTE — Telephone Encounter (Signed)
I called and spoke with the patients daughter and informed  her that the provider sent in an antibiotic to start on and we will call her when the culture comes in and she understood.  Lena Gores,cma

## 2022-06-26 NOTE — Addendum Note (Signed)
Addended by: [name] on: 06/26/2022 04:31 PM   Modules accepted: Orders

## 2022-06-26 NOTE — Addendum Note (Signed)
Addended by: Leeanne Rio on: 06/26/2022 10:16 AM   Modules accepted: Orders

## 2022-06-28 ENCOUNTER — Telehealth: Payer: Self-pay

## 2022-06-28 LAB — URINE CULTURE
MICRO NUMBER:: 14467068
SPECIMEN QUALITY:: ADEQUATE

## 2022-06-28 NOTE — Telephone Encounter (Signed)
Lm for pt to cb  Kylie Haven, MD  [name] Please let the patients family know that the bacteria in her urine should be sensitive to the antibiotic I sent in earlier this week. If he symptoms are not improving they need to let us know. She needs a recheck of her urine in 4 weeks. Thanks.

## 2022-06-29 ENCOUNTER — Other Ambulatory Visit: Payer: Self-pay | Admitting: Cardiovascular Disease

## 2022-07-01 ENCOUNTER — Encounter: Payer: Self-pay | Admitting: Family Medicine

## 2022-07-01 ENCOUNTER — Telehealth: Payer: Self-pay | Admitting: Family Medicine

## 2022-07-01 ENCOUNTER — Ambulatory Visit (INDEPENDENT_AMBULATORY_CARE_PROVIDER_SITE_OTHER): Payer: Medicare Other | Admitting: Family Medicine

## 2022-07-01 VITALS — BP 110/70 | HR 60 | Temp 98.4°F | Resp 16 | Ht 59.0 in | Wt 110.5 lb

## 2022-07-01 DIAGNOSIS — M419 Scoliosis, unspecified: Secondary | ICD-10-CM

## 2022-07-01 DIAGNOSIS — J309 Allergic rhinitis, unspecified: Secondary | ICD-10-CM | POA: Diagnosis not present

## 2022-07-01 DIAGNOSIS — I5032 Chronic diastolic (congestive) heart failure: Secondary | ICD-10-CM | POA: Diagnosis not present

## 2022-07-01 DIAGNOSIS — L989 Disorder of the skin and subcutaneous tissue, unspecified: Secondary | ICD-10-CM

## 2022-07-01 DIAGNOSIS — G629 Polyneuropathy, unspecified: Secondary | ICD-10-CM

## 2022-07-01 DIAGNOSIS — R7989 Other specified abnormal findings of blood chemistry: Secondary | ICD-10-CM

## 2022-07-01 DIAGNOSIS — E782 Mixed hyperlipidemia: Secondary | ICD-10-CM

## 2022-07-01 DIAGNOSIS — E039 Hypothyroidism, unspecified: Secondary | ICD-10-CM

## 2022-07-01 DIAGNOSIS — Z8639 Personal history of other endocrine, nutritional and metabolic disease: Secondary | ICD-10-CM | POA: Diagnosis not present

## 2022-07-01 DIAGNOSIS — N3001 Acute cystitis with hematuria: Secondary | ICD-10-CM

## 2022-07-01 DIAGNOSIS — E559 Vitamin D deficiency, unspecified: Secondary | ICD-10-CM | POA: Diagnosis not present

## 2022-07-01 LAB — LIPID PANEL
Cholesterol: 208 mg/dL — ABNORMAL HIGH (ref 0–200)
HDL: 57.4 mg/dL (ref >39.00)
LDL Cholesterol: 124 mg/dL — ABNORMAL HIGH (ref 0–99)
NonHDL: 150.57
Total CHOL/HDL Ratio: 4
Triglycerides: 135 mg/dL (ref 0.0–149.0)
VLDL: 27 mg/dL (ref 0.0–40.0)

## 2022-07-01 LAB — CBC
HCT: 41.9 % (ref 36.0–46.0)
Hemoglobin: 14.2 g/dL (ref 12.0–15.0)
MCHC: 33.9 g/dL (ref 30.0–36.0)
MCV: 95.7 fl (ref 78.0–100.0)
Platelets: 331 10*3/uL (ref 150.0–400.0)
RBC: 4.38 Mil/uL (ref 3.87–5.11)
RDW: 13.3 % (ref 11.5–15.5)
WBC: 9.7 10*3/uL (ref 4.0–10.5)

## 2022-07-01 LAB — COMPREHENSIVE METABOLIC PANEL
ALT: 22 U/L (ref 0–35)
AST: 23 U/L (ref 0–37)
Albumin: 4 g/dL (ref 3.5–5.2)
Alkaline Phosphatase: 52 U/L (ref 39–117)
BUN: 13 mg/dL (ref 6–23)
CO2: 28 mEq/L (ref 19–32)
Calcium: 9.8 mg/dL (ref 8.4–10.5)
Chloride: 101 mEq/L (ref 96–112)
Creatinine, Ser: 1.03 mg/dL (ref 0.40–1.20)
GFR: 45.21 mL/min — ABNORMAL LOW (ref >60.00)
Glucose, Bld: 87 mg/dL (ref 70–99)
Potassium: 4.7 mEq/L (ref 3.5–5.1)
Sodium: 138 mEq/L (ref 135–145)
Total Bilirubin: 0.4 mg/dL (ref 0.2–1.2)
Total Protein: 7 g/dL (ref 6.0–8.3)

## 2022-07-01 LAB — TSH: TSH: 5.89 u[IU]/mL — ABNORMAL HIGH (ref 0.35–5.50)

## 2022-07-01 LAB — VITAMIN D 25 HYDROXY (VIT D DEFICIENCY, FRACTURES): VITD: 88.26 ng/mL (ref 30.00–100.00)

## 2022-07-01 LAB — VITAMIN B12: Vitamin B-12: 1036 pg/mL — ABNORMAL HIGH (ref 211–911)

## 2022-07-01 MED ORDER — AZELASTINE HCL 0.1 % NA SOLN: 2.0000 | Freq: Two times a day (BID) | NASAL | 12 refills | Status: DC

## 2022-07-01 MED ORDER — FLUTICASONE PROPIONATE 50 MCG/ACT NA SUSP: 2.0000 | Freq: Every day | NASAL | 11 refills | Status: DC

## 2022-07-01 MED ORDER — OMEPRAZOLE 40 MG PO CPDR: DELAYED_RELEASE_CAPSULE | ORAL | 2 refills | Status: DC

## 2022-07-01 NOTE — Assessment & Plan Note (Signed)
Symptoms in her feet are likely related to neuropathy.  Will check a B12 and a glucose.

## 2022-07-01 NOTE — Assessment & Plan Note (Signed)
History of this in the past.  We will recheck today.

## 2022-07-01 NOTE — Assessment & Plan Note (Signed)
High on most recent labs.  Recheck today.

## 2022-07-01 NOTE — Assessment & Plan Note (Signed)
Chronic issue.  Back pain is likely related to this.  She will monitor and can take Tylenol over-the-counter for this.

## 2022-07-01 NOTE — Assessment & Plan Note (Signed)
Undetermined cause of the sore area on her left ankle.  We will request notes from dermatology to see what they thought about this lesion.

## 2022-07-01 NOTE — Progress Notes (Signed)
Tommi Rumps, MD Phone: [phone]  Kylie May is a 89 y.o. female who presents today for follow-up.  The patient is deaf and communicates by typing on her phone and reading the typed messages on her phone.  UTI: The patient has been on antibiotics and her daughter notes she has 2 days left.  She has been pushing fluids.  The patient notes she feels better.  She has had no hematuria.  No significant increase in urinary frequency that is not accounted for by the increased fluids.  She has had some mild increase in edema with the increased fluids.  Sore spot on left ankle: This has been present for a long time.  She notes she previously saw dermatology for this and they told her that it would take quite some time for this to heal.  It has healed up though it remains sore.  She does see dermatology periodically.  Hypothyroidism: Due for TSH check.  Neuropathy: Patient reports at times it feels as though she has shoes on when she does not.  She has some numbness in her feet bilaterally.  At times her feet feel cold.  She notes no other numbness elsewhere.  Back pain: Patient reports chronic issues with back pain and occasionally aching in her right side.  This mostly bothers her when she gets up in the morning and is a little better if she moves around.  She does take some Tylenol for this.  Allergic rhinitis: Patient continues to have some postnasal drip and congestion in her throat as well as rhinorrhea.  Her cough has improved.  No sore throat.  They request a refill of Flonase.  Social History   Tobacco Use  Smoking Status Former   Types: Cigarettes   Quit date: 06/04/1951   Years since quitting: 71.1  Smokeless Tobacco Never  Tobacco Comments   Smoked 1945-1953 , up to 2 cigarettes/day    Current Outpatient Medications on File Prior to Visit  Medication Sig Dispense Refill   acetaminophen (TYLENOL) 325 MG tablet Take 2 tablets (650 mg total) by mouth every 6 (six) hours  as needed for mild pain (or Fever >/= 101).     amiodarone (PACERONE) 200 MG tablet Take 1 tablet (200 mg total) by mouth daily. PLEASE SCHEDULE OFFICE VISIT FOR FURTHER REFILLS. THANK YOU! 90 tablet 0   apixaban (ELIQUIS) 2.5 MG TABS tablet Take 1 tablet (2.5 mg total) by mouth 2 (two) times daily. 180 tablet 2   benzonatate (TESSALON) 100 MG capsule Take 1 capsule (100 mg total) by mouth 3 (three) times daily as needed for cough. 20 capsule 1   cefdinir (OMNICEF) 300 MG capsule Take 1 capsule (300 mg total) by mouth daily. 7 capsule 0   ferrous sulfate 325 (65 FE) MG EC tablet Take 325 mg by mouth daily.     hydrocortisone (ANUSOL-HC) 2.5 % rectal cream Place 1 application rectally 2 (two) times daily. 30 g 0   hydrocortisone (ANUSOL-HC) 25 MG suppository Place 1 suppository (25 mg total) rectally 2 (two) times daily as needed for hemorrhoids or anal itching. 14 suppository 1   levothyroxine (SYNTHROID) 50 MCG tablet Take 1 tablet (50 mcg total) by mouth daily. 90 tablet 3   magnesium chloride (SLOW-MAG) 64 MG TBEC SR tablet Take 2 tablets (128 mg total) by mouth daily. 60 tablet 3   metoprolol tartrate (LOPRESSOR) 25 MG tablet Take 1 tablet (25 mg total) by mouth 2 (two) times daily. PLEASE SCHEDULE OFFICE VISIT  FOR FURTHER REFILLS. THANK YOU! 180 tablet 0   nystatin cream (MYCOSTATIN) nystatin 100,000 unit/gram topical cream  APPLY TOPICALLY TO THE AFFECTED AREA TWICE DAILY     OLANZapine (ZYPREXA) 2.5 MG tablet Take 1 tablet (2.5 mg total) by mouth at bedtime. 30 tablet 0   rosuvastatin (CRESTOR) 20 MG tablet Take 1 tablet (20 mg total) by mouth at bedtime. 90 tablet 3   sodium chloride 1 g tablet Take 1 tablet (1 g total) by mouth 2 (two) times daily with a meal. 60 tablet 0   Sodium Chloride Flush (NORMAL SALINE FLUSH) 0.9 % SOLN FLUSH ONCE DAILY AS DIRECTED 1000 mL 3   traMADol (ULTRAM) 50 MG tablet Take 50 mg by mouth every 6 (six) hours as needed.     No current facility-administered  medications on file prior to visit.     ROS see history of present illness  Objective  Physical Exam Vitals:   07/01/22 0933  BP: 110/70  Pulse: 60  Resp: 16  Temp: 98.4 F (36.9 C)  SpO2: 97%    BP Readings from Last 3 Encounters:  07/01/22 110/70  06/12/22 115/70  03/29/22 116/80   Wt Readings from Last 3 Encounters:  07/01/22 110 lb 8 oz (50.1 kg)  06/12/22 111 lb (50.3 kg)  03/29/22 108 lb 9.6 oz (49.3 kg)    Physical Exam Constitutional:      General: She is not in acute distress.    Appearance: She is not diaphoretic.  Cardiovascular:     Rate and Rhythm: Normal rate and regular rhythm.     Heart sounds: Normal heart sounds.  Pulmonary:     Effort: Pulmonary effort is normal.     Breath sounds: Normal breath sounds.  Musculoskeletal:       Back:       Feet:     Comments: Trace edema bilateral lower legs  Skin:    General: Skin is warm and dry.  Neurological:     Mental Status: She is alert.      Assessment/Plan: Please see individual problem list.  Allergic rhinitis, unspecified seasonality, unspecified trigger Assessment & Plan: Chronic issue.  Patient will continue with Flonase 2 sprays each nostril once daily.  Will add Astelin 2 sprays each nostril twice daily.  Orders: -     Azelastine HCl; Place 2 sprays into both nostrils 2 (two) times daily. Use in each nostril as directed  Dispense: 30 mL; Refill: 12 -     Fluticasone Propionate; Place 2 sprays into both nostrils daily.  Dispense: 16 g; Refill: 11  Hypothyroidism, unspecified type Assessment & Plan: Chronic issue.  Check TSH.  Continue Synthroid 50 mcg daily.  Orders: -     TSH  Acute cystitis with hematuria Assessment & Plan: Seems to have been improving.  Discussed she should finish her antibiotics and we would recheck her urine in 3 weeks.  If she has recurrence of symptoms prior to then she should let us know.  Discussed adequate cleaning.  Discussed they could try cranberry  juice or a cranberry supplement to see if that would make any difference.   Mixed hyperlipidemia -     Comprehensive metabolic panel -     Lipid panel  Neuropathy Assessment & Plan: Symptoms in her feet are likely related to neuropathy.  Will check a B12 and a glucose.  Orders: -     Vitamin B12 -     CBC  High serum vitamin D Assessment &  Plan: High on most recent labs.  Recheck today.  Orders: -     VITAMIN D 25 Hydroxy (Vit-D Deficiency, Fractures)  Vitamin D deficiency Assessment & Plan: History of this in the past.  We will recheck today.  Orders: -     VITAMIN D 25 Hydroxy (Vit-D Deficiency, Fractures)  Scoliosis of lumbar spine, unspecified scoliosis type Assessment & Plan: Chronic issue.  Back pain is likely related to this.  She will monitor and can take Tylenol over-the-counter for this.   Skin lesion Assessment & Plan: Undetermined cause of the sore area on her left ankle.  We will request notes from dermatology to see what they thought about this lesion.   Chronic diastolic CHF (congestive heart failure) (HCC) Assessment & Plan: Edema could be related to this or venous insufficiency.  We will check lab work to evaluate for other underlying causes and then consider treatment with Lasix if needed.   History of vitamin D deficiency Assessment & Plan: History of this in the past.  We will recheck today.   Other orders -     Omeprazole; TAKE 1 CAPSULE DAILY (PLEASE SCHEDULE OFFICE VISIT FOR FURTHER REFILLS)  Dispense: 90 capsule; Refill: 2    Return in about 3 weeks (around 07/22/2022) for Urine recheck and Prevnar 20 with nursing, 3 months PCP.  I have spent 49 minutes in the care of this patient regarding history taking, documentation, completion of exam, discussion of plan, placing orders, communicating with the patient via text on her phone.   Tommi Rumps, MD [place]

## 2022-07-01 NOTE — Assessment & Plan Note (Signed)
Seems to have been improving.  Discussed she should finish her antibiotics and we would recheck her urine in 3 weeks.  If she has recurrence of symptoms prior to then she should let us know.  Discussed adequate cleaning.  Discussed they could try cranberry juice or a cranberry supplement to see if that would make any difference.

## 2022-07-01 NOTE — Telephone Encounter (Signed)
Noted  

## 2022-07-01 NOTE — Assessment & Plan Note (Signed)
Chronic issue.  Check TSH.  Continue Synthroid 50 mcg daily. 

## 2022-07-01 NOTE — Assessment & Plan Note (Addendum)
Chronic issue.  Patient will continue with Flonase 2 sprays each nostril once daily.  Will add Astelin 2 sprays each nostril twice daily.

## 2022-07-01 NOTE — Telephone Encounter (Signed)
During checkout today, pt daughter said that when results are back(lab work from today) she would like a phone call. She's available [phone].

## 2022-07-01 NOTE — Assessment & Plan Note (Signed)
Edema could be related to this or venous insufficiency.  We will check lab work to evaluate for other underlying causes and then consider treatment with Lasix if needed.

## 2022-07-01 NOTE — Patient Instructions (Signed)
Nice to see you. Will get some lab work today and contact with the results. Please consider getting the RSV vaccine in a couple of weeks once you have recovered from your UTI. We can do the Prevnar 20 pneumonia vaccine when you follow-up to have your urine rechecked in 3 to 4 weeks. Please try the Flonase and the Astelin for your postnasal drip issues.

## 2022-07-04 ENCOUNTER — Encounter: Payer: Self-pay | Admitting: Family Medicine

## 2022-07-05 ENCOUNTER — Other Ambulatory Visit: Payer: Self-pay

## 2022-07-12 ENCOUNTER — Telehealth: Payer: Self-pay | Admitting: Student

## 2022-07-12 ENCOUNTER — Ambulatory Visit
Admission: RE | Admit: 2022-07-12 | Discharge: 2022-07-12 | Disposition: A | Discharge status: Home / Self Care | Payer: Medicare Other | Source: Ambulatory Visit | Attending: Interventional Radiology | Admitting: Interventional Radiology

## 2022-07-12 ENCOUNTER — Other Ambulatory Visit: Payer: Self-pay | Admitting: Interventional Radiology

## 2022-07-12 ENCOUNTER — Other Ambulatory Visit: Payer: Self-pay | Admitting: Student

## 2022-07-12 DIAGNOSIS — K810 Acute cholecystitis: Secondary | ICD-10-CM

## 2022-07-12 DIAGNOSIS — Z434 Encounter for attention to other artificial openings of digestive tract: Secondary | ICD-10-CM | POA: Diagnosis not present

## 2022-07-12 DIAGNOSIS — T85520A Displacement of bile duct prosthesis, initial encounter: Secondary | ICD-10-CM | POA: Diagnosis not present

## 2022-07-12 HISTORY — PX: IR EXCHANGE BILIARY DRAIN: IMG6046

## 2022-07-12 MED ORDER — LIDOCAINE HCL 1 % IJ SOLN
INTRAMUSCULAR | Status: AC
  Administered 2022-07-12: 5 mL
  Filled 2022-07-12: qty 20

## 2022-07-12 MED ORDER — IOHEXOL 300 MG/ML  SOLN: 8.0000 mL | Freq: Once | INTRAMUSCULAR | Status: DC | PRN

## 2022-07-12 NOTE — Telephone Encounter (Signed)
Patient's daughter called regarding concern for malfunctioning perc chole drain. She states that the drain has significant leakage of bile from the drain insertion site and that there has been minimal drainage into the bag. It was decided with the patient's daughter to bring the patient to [facility] for further evaluation and possible intervention.

## 2022-07-12 NOTE — Telephone Encounter (Signed)
Duplicate encounter, please see other telephone encounter

## 2022-07-16 ENCOUNTER — Other Ambulatory Visit: Payer: Self-pay | Admitting: Interventional Radiology

## 2022-07-16 ENCOUNTER — Ambulatory Visit
Admission: RE | Admit: 2022-07-16 | Discharge: 2022-07-16 | Disposition: A | Discharge status: Home / Self Care | Payer: Medicare Other | Source: Ambulatory Visit | Attending: Interventional Radiology | Admitting: Interventional Radiology

## 2022-07-16 DIAGNOSIS — Z8719 Personal history of other diseases of the digestive system: Secondary | ICD-10-CM

## 2022-07-16 DIAGNOSIS — Z434 Encounter for attention to other artificial openings of digestive tract: Secondary | ICD-10-CM | POA: Diagnosis not present

## 2022-07-16 HISTORY — PX: IR CHOLANGIOGRAM EXISTING TUBE: IMG6040

## 2022-07-16 MED ORDER — LIDOCAINE HCL 1 % IJ SOLN
INTRAMUSCULAR | Status: AC
  Filled 2022-07-16: qty 20

## 2022-07-16 MED ORDER — IOHEXOL 300 MG/ML  SOLN
8.0000 mL | Freq: Once | INTRAMUSCULAR | Status: AC | PRN
  Administered 2022-07-16: 8 mL

## 2022-07-22 ENCOUNTER — Ambulatory Visit: Payer: Medicare Other

## 2022-07-23 ENCOUNTER — Telehealth: Payer: Self-pay | Admitting: Family Medicine

## 2022-07-23 ENCOUNTER — Ambulatory Visit (INDEPENDENT_AMBULATORY_CARE_PROVIDER_SITE_OTHER): Payer: Medicare Other

## 2022-07-23 DIAGNOSIS — Z23 Encounter for immunization: Secondary | ICD-10-CM | POA: Diagnosis not present

## 2022-07-23 DIAGNOSIS — R309 Painful micturition, unspecified: Secondary | ICD-10-CM | POA: Diagnosis not present

## 2022-07-23 NOTE — Telephone Encounter (Signed)
During check out today, pt daughter mentioned that any results after today visit, she give her a call to F/U.

## 2022-07-23 NOTE — Addendum Note (Signed)
Addended by: Neta Ehlers on: 07/23/2022 03:24 PM   Modules accepted: Orders

## 2022-07-23 NOTE — Progress Notes (Signed)
Patient presented for prevnar 20 injection to left deltoid, patient voiced no concerns nor showed any signs of distress during injection

## 2022-07-24 ENCOUNTER — Encounter: Payer: Self-pay | Admitting: Family Medicine

## 2022-07-24 LAB — URINALYSIS, ROUTINE W REFLEX MICROSCOPIC
Bilirubin Urine: NEGATIVE
Hgb urine dipstick: NEGATIVE
Ketones, ur: NEGATIVE
Nitrite: POSITIVE — AB
RBC / HPF: NONE SEEN (ref 0–?)
Specific Gravity, Urine: 1.01 (ref 1.000–1.030)
Total Protein, Urine: NEGATIVE
Urine Glucose: NEGATIVE
Urobilinogen, UA: 0.2 (ref 0.0–1.0)
pH: 6 (ref 5.0–8.0)

## 2022-07-24 NOTE — Telephone Encounter (Signed)
Kylie May keenly would like to be called regarding pt urine results [phone]

## 2022-07-25 NOTE — Telephone Encounter (Signed)
Attempted to call Patient-no answer or voicemail

## 2022-07-31 NOTE — Progress Notes (Signed)
Date:  08/02/2022   ID:  Kylie May, DOB 14-Oct-1921, MRN [id number]  Patient Location:  [place] [address]   Provider location:   [facility], [place] office  PCP:  [name], MD  Cardiologist:  [name] [facility]   Chief Complaint  Patient presents with   Other    12 month f/u no complaints today. Meds reviewed verbally with pt.    History of Present Illness:    Kylie May is a 89 y.o. female  past medical history of paroxysmal asymptomatic  a-fib,  HTN,  hiatal hernia,  osteopenia,  degenerative joint disease,  Scoliosis Long history of falls, 2018 prior fall with pelvic fracture  admitted to [facility] 1/7-1/10/16 for new onset a-fib  And mild diastolic CHF, who presents today for follow-up of her atrial fibrillation, aortic atherosclerosis  Last seen by myself in clinic January 2023 Presents today with family, additional family on the phone for further discussion Still with drain in gall bladder  Had drain changed out numerous times Drain in for 4 years now, initially placed 3/20  Has UTI, reports she is slowly getting better with treatment  Denies tachypalpitations concerning for atrial fibrillation Remains on low-dose amiodarone with metoprolol, Eliquis 2.5 twice daily  Significant assistance at home from family Hard of hearing, communicate through typing on smart phone  No longer on Crestor, total cholesterol up to 200  Weight stable, trending upwards  EKG personally reviewed by myself on todays visit Shows sinus bradycardia rate 63 bpm poor R wave progression to the anterior leads, unable to exclude old inferior MI  Other past medical history reviewed Event from hospitalization December 2022 Diagnosed with fall, Hypotension [I95.9] Lip laceration, initial encounter [S01.511A] Injury of head, initial encounter [S09.90XA] Closed fracture of proximal end of left humerus, unspecified  fracture morphology, initial encounter [S42.202A] Sepsis, due to unspecified organism, unspecified whether acute organ dysfunction present ([place]  Diagnosed with hyponatremia, SIADH, sodium 128 on discharge Was started on salt tabs twice a day Sodium as low as 125 during hospital course Anemia: HGB 6.7 up To 8.4 on discharge   from 2020, severe epigastric pain from 08/15/2018-08/26/2018  CT of her abdomen and pelvis which showed intra-and extrahepatic ductal dilatation and gallbladder wall thickening but no stones    transferred to [facility] for ERCP.  She underwent cholecystostomy tube placement by interventional radiology and subsequently underwent ERCP which showed dilatation of the bile duct and cholelithiasis.  She had the stone removed and had a biliary sphincterectomy performed and a plastic stent was placed in the pancreatic duct   A. fib with RVR complicated by decompensated heart failure with fluid retention.  She was on anticoagulation with apixaban amiodarone metoprolol placed on oral Lasix  Suffered severe calorie protein malnutrition Lower extremity edema, anemia On last clinic visit recommended to decrease amiodarone dosing, continue metoprolol, on anticoagulation, continue Lasix as needed for leg swelling -She had a cholecystostomy bag.,  Biliary drain was in place Scheduled to see IR on last visit   Past Medical History:  Diagnosis Date   A-fib ([place])    Acid reflux disease    Arrhythmia    Atrial fibrillation with RVR ([place]) 08/15/2018   Chronic diastolic CHF (congestive heart failure) ([place])    Compression fracture of lumbar vertebra ([place])    Diverticulitis 01-2008   GI [place]   Diverticulosis    Esophageal stricture 2006  Femoral bruit    Right   Fibrocystic breast disease    Hiatal hernia    History of esophageal stricture 06/14/2004   2006 esophageal dilation Schatzki's ring    History of skin cancer 12/29/2008   Basal cell cancer right glabella  07/10/2010 Dr. Evorn Gong, [facility] @ [place] , [place]    Hypercholesterolemia    [place] study LDL goal = < 160   Mitral regurgitation    a. 06/2014 EF 55-60%, elevated end-diastolic pressures, dilated LA at 4.3 cm, mildly dilated RA, severe mitral regurgitation, mild aortic sclerosis without stenosis, mod-severe TR   Mitral valve prolapse    Osteoporosis    Dr Matthew Saras   Pancreatitis 07-2008   Hospitalized    Patella fracture 05/13/2018   Schatzki's ring    Sepsis ([place]) 08/15/2018   Skin cancer    facial x 2. [name]   Past Surgical History:  Procedure Laterality Date   BILIARY STENT PLACEMENT  08/21/2018   Procedure: BILIARY STENT PLACEMENT;  Surgeon: [name], MD;  Location: [facility] ENDOSCOPY;  Service: Gastroenterology;;   COLONOSCOPY  2006   Epidural steroids     x1   ERCP N/A 08/21/2018   Procedure: ENDOSCOPIC RETROGRADE CHOLANGIOPANCREATOGRAPHY (ERCP);  Surgeon: [name], MD;  Location: [facility];  Service: Gastroenterology;  Laterality: N/A;   ESOPHAGEAL DILATION  2006   ESOPHAGOGASTRODUODENOSCOPY (EGD) WITH PROPOFOL N/A 11/16/2018   Procedure: ESOPHAGOGASTRODUODENOSCOPY (EGD) WITH PROPOFOL;  Surgeon: [name], MD;  Location[facility] ENDOSCOPY;  Service: Gastroenterology;  Laterality: N/A;   G3 P4 (twins)     IR CHOLANGIOGRAM EXISTING TUBE  08/18/2018   IR CHOLANGIOGRAM EXISTING TUBE  07/16/2022   IR EXCHANGE BILIARY DRAIN  11/03/2018   IR EXCHANGE BILIARY DRAIN  12/01/2018   IR EXCHANGE BILIARY DRAIN  05/13/2019   IR EXCHANGE BILIARY DRAIN  06/14/2019   IR EXCHANGE BILIARY DRAIN  07/19/2019   IR EXCHANGE BILIARY DRAIN  08/17/2019   IR EXCHANGE BILIARY DRAIN  09/30/2019   IR EXCHANGE BILIARY DRAIN  12/08/2019   IR EXCHANGE BILIARY DRAIN  01/19/2020   IR EXCHANGE BILIARY DRAIN  03/01/2020   IR EXCHANGE BILIARY DRAIN  04/20/2020   IR EXCHANGE BILIARY DRAIN  07/06/2020   IR EXCHANGE BILIARY DRAIN  10/26/2020   IR EXCHANGE BILIARY DRAIN  01/18/2021   IR EXCHANGE BILIARY DRAIN   03/05/2021   IR EXCHANGE BILIARY DRAIN  05/25/2021   IR EXCHANGE BILIARY DRAIN  08/03/2021   IR EXCHANGE BILIARY DRAIN  09/28/2021   IR EXCHANGE BILIARY DRAIN  11/23/2021   IR EXCHANGE BILIARY DRAIN  12/27/2021   IR EXCHANGE BILIARY DRAIN  02/20/2022   IR EXCHANGE BILIARY DRAIN  04/17/2022   IR EXCHANGE BILIARY DRAIN  06/17/2022   IR EXCHANGE BILIARY DRAIN  07/12/2022   IR PERC CHOLECYSTOSTOMY  08/16/2018   IR RADIOLOGIST EVAL & MGMT  11/08/2021   REMOVAL OF STONES  08/21/2018   Procedure: REMOVAL OF STONES;  Surgeon: [name], MD;  Location: [facility];  Service: Gastroenterology;;   SKIN CANCER EXCISION     SPHINCTEROTOMY  08/21/2018   Procedure: SPHINCTEROTOMY;  Surgeon: [name], MD;  Location: [facility];  Service: Gastroenterology;;     Current Meds  Medication Sig   acetaminophen (TYLENOL) 325 MG tablet Take 2 tablets (650 mg total) by mouth every 6 (six) hours as needed for mild pain (or Fever >/= 101).   amiodarone (PACERONE) 200 MG tablet Take 1 tablet (200 mg total) by mouth daily.  PLEASE SCHEDULE OFFICE VISIT FOR FURTHER REFILLS. THANK YOU!   apixaban (ELIQUIS) 2.5 MG TABS tablet Take 1 tablet (2.5 mg total) by mouth 2 (two) times daily.   azelastine (ASTELIN) 0.1 % nasal spray Place 2 sprays into both nostrils 2 (two) times daily. Use in each nostril as directed   benzonatate (TESSALON) 100 MG capsule Take 1 capsule (100 mg total) by mouth 3 (three) times daily as needed for cough.   cefdinir (OMNICEF) 300 MG capsule Take 1 capsule (300 mg total) by mouth daily.   Famotidine (PEPCID PO) Take by mouth daily at 2 am.   ferrous sulfate 325 (65 FE) MG EC tablet Take 325 mg by mouth daily.   fluticasone (FLONASE) 50 MCG/ACT nasal spray Place 2 sprays into both nostrils daily.   hydrocortisone (ANUSOL-HC) 2.5 % rectal cream Place 1 application rectally 2 (two) times daily.   hydrocortisone (ANUSOL-HC) 25 MG suppository Place 1 suppository (25 mg total) rectally 2 (two) times daily as  needed for hemorrhoids or anal itching.   levothyroxine (SYNTHROID) 50 MCG tablet Take 1 tablet (50 mcg total) by mouth daily.   magnesium chloride (SLOW-MAG) 64 MG TBEC SR tablet Take 2 tablets (128 mg total) by mouth daily.   metoprolol tartrate (LOPRESSOR) 25 MG tablet Take 1 tablet (25 mg total) by mouth 2 (two) times daily. PLEASE SCHEDULE OFFICE VISIT FOR FURTHER REFILLS. THANK YOU!   nystatin cream (MYCOSTATIN) nystatin 100,000 unit/gram topical cream  APPLY TOPICALLY TO THE AFFECTED AREA TWICE DAILY   OLANZapine (ZYPREXA) 2.5 MG tablet Take 1 tablet (2.5 mg total) by mouth at bedtime.   omeprazole (PRILOSEC) 40 MG capsule TAKE 1 CAPSULE DAILY (PLEASE SCHEDULE OFFICE VISIT FOR FURTHER REFILLS)   Probiotic Product (PROBIOTIC PO) Take by mouth daily at 2 am.   sodium chloride 1 g tablet Take 1 tablet (1 g total) by mouth 2 (two) times daily with a meal.   Sodium Chloride Flush (NORMAL SALINE FLUSH) 0.9 % SOLN FLUSH ONCE DAILY AS DIRECTED   traMADol (ULTRAM) 50 MG tablet Take 50 mg by mouth every 6 (six) hours as needed.     Allergies:   Metronidazole and Penicillin g   Social History   Tobacco Use   Smoking status: Former    Types: Cigarettes    Quit date: 06/04/1951    Years since quitting: 71.2   Smokeless tobacco: Never   Tobacco comments:    Smoked 1945-1953 , up to 2 cigarettes/day  Vaping Use   Vaping Use: Never used  Substance Use Topics   Alcohol use: Yes    Alcohol/week: 1.0 standard drink of alcohol    Types: 1 Glasses of wine per week    Comment: occassional   Drug use: No     Family Hx: The patient's family history includes Breast cancer in her maternal aunt; Heart attack (age of onset: 65) in her daughter; Heart attack (age of onset: 91) in her sister; Hepatitis in her daughter; Liver cancer in her daughter; Lung disease in her mother. There is no history of Diabetes or Stroke.  ROS:   Please see the history of present illness.    Review of Systems   Constitutional:  Positive for malaise/fatigue.  HENT: Negative.    Respiratory: Negative.    Cardiovascular: Negative.   Gastrointestinal: Negative.   Musculoskeletal:  Positive for back pain.  Neurological: Negative.   Psychiatric/Behavioral: Negative.    All other systems reviewed and are negative.    Labs/Other Tests  and Data Reviewed:    Recent Labs: 07/01/2022: ALT 22; BUN 13; Creatinine, Ser 1.03; Hemoglobin 14.2; Platelets 331.0; Potassium 4.7; Sodium 138; TSH 5.89   Recent Lipid Panel Lab Results  Component Value Date/Time   CHOL 208 (H) 07/01/2022 10:22 AM   CHOL 102 06/12/2021 12:15 PM   TRIG 135.0 07/01/2022 10:22 AM   HDL 57.40 07/01/2022 10:22 AM   HDL 50 06/12/2021 12:15 PM   CHOLHDL 4 07/01/2022 10:22 AM   LDLCALC 124 (H) 07/01/2022 10:22 AM   LDLCALC 35 06/12/2021 12:15 PM   LDLCALC 47 09/17/2019 03:15 PM   LDLDIRECT 121.4 06/17/2011 11:46 AM    Wt Readings from Last 3 Encounters:  08/02/22 112 lb (50.8 kg)  07/01/22 110 lb 8 oz (50.1 kg)  06/12/22 111 lb (50.3 kg)     Exam:    BP 110/70 (BP Location: Right Arm, Patient Position: Sitting, Cuff Size: Normal)   Pulse 63   Ht '4\' 11"'$  (1.499 m)   Wt 112 lb (50.8 kg)   SpO2 94%   BMI 22.62 kg/m   Constitutional:  oriented to person, place, and time. No distress.  Thin, frail HENT:  Head: Grossly normal Eyes:  no discharge. No scleral icterus.  Neck: No JVD, no carotid bruits  Cardiovascular: Regular rate and rhythm, no murmurs appreciated Pulmonary/Chest: Clear to auscultation bilaterally, no wheezes or rails Abdominal: Soft.  no distension.  no tenderness.  Musculoskeletal: Normal range of motion Neurological:  normal muscle tone. Coordination normal. No atrophy Skin: Skin warm and dry Psychiatric: normal affect, pleasant   ASSESSMENT & PLAN:    Paroxysmal atrial fibrillation (HCC) Maintaining normal sinus rhythm, no symptoms concerning for paroxysmal A-fib Recommend she stay on metoprolol  25 twice daily, amiodarone 200 daily Eliquis 2.5 twice daily   Chronic diastolic CHF (congestive heart failure) (HCC) Appears relatively euvolemic No further medication changes made   Abdominal aortic atherosclerosis (HCC)  No further work-up needed given age Off Crestor per family   Pure hypercholesterolemia No changes to medications Lab work pending   Severe protein-calorie malnutrition ([place]) Weight improved, stable  Long discussion with family who presents with her today and family on the phone, all questions answered  Total encounter time more than 40 minutes  Greater than 50% was spent in counseling and coordination of care with the patient    Signed, Ida Rogue, MD  08/02/2022 4:50 PM    [place] [address], [place], [place] [address]

## 2022-08-01 ENCOUNTER — Other Ambulatory Visit: Payer: Self-pay

## 2022-08-01 ENCOUNTER — Telehealth: Payer: Self-pay | Admitting: Family Medicine

## 2022-08-01 DIAGNOSIS — E039 Hypothyroidism, unspecified: Secondary | ICD-10-CM

## 2022-08-01 NOTE — Telephone Encounter (Signed)
Patient has a lab appt 08/06/2022, there are No orders in.

## 2022-08-02 ENCOUNTER — Ambulatory Visit: Payer: Medicare Other | Attending: Cardiovascular Disease | Admitting: Cardiovascular Disease

## 2022-08-02 ENCOUNTER — Encounter: Payer: Self-pay | Admitting: Cardiovascular Disease

## 2022-08-02 VITALS — BP 110/70 | HR 63 | Ht 59.0 in | Wt 112.0 lb

## 2022-08-02 DIAGNOSIS — E7800 Pure hypercholesterolemia, unspecified: Secondary | ICD-10-CM | POA: Insufficient documentation

## 2022-08-02 DIAGNOSIS — I700 Atherosclerosis of aorta: Secondary | ICD-10-CM | POA: Diagnosis not present

## 2022-08-02 DIAGNOSIS — I4891 Unspecified atrial fibrillation: Secondary | ICD-10-CM | POA: Insufficient documentation

## 2022-08-02 DIAGNOSIS — R600 Localized edema: Secondary | ICD-10-CM | POA: Diagnosis not present

## 2022-08-02 DIAGNOSIS — D649 Anemia, unspecified: Secondary | ICD-10-CM | POA: Diagnosis not present

## 2022-08-02 DIAGNOSIS — E43 Unspecified severe protein-calorie malnutrition: Secondary | ICD-10-CM | POA: Diagnosis not present

## 2022-08-02 DIAGNOSIS — I5032 Chronic diastolic (congestive) heart failure: Secondary | ICD-10-CM | POA: Diagnosis not present

## 2022-08-02 MED ORDER — AMIODARONE HCL 200 MG PO TABS: 200.0000 mg | ORAL_TABLET | Freq: Every day | ORAL | 3 refills | Status: DC

## 2022-08-02 MED ORDER — APIXABAN 2.5 MG PO TABS: 2.5000 mg | ORAL_TABLET | Freq: Two times a day (BID) | ORAL | 3 refills | Status: DC

## 2022-08-02 MED ORDER — METOPROLOL TARTRATE 25 MG PO TABS: 25.0000 mg | ORAL_TABLET | Freq: Two times a day (BID) | ORAL | 0 refills | Status: DC

## 2022-08-02 NOTE — Patient Instructions (Signed)
Medication Instructions:  No changes  If you need a refill on your cardiac medications before your next appointment, please call your pharmacy.   Lab work: No new labs needed  Testing/Procedures: No new testing needed  Follow-Up: At [facility], you and your health needs are our priority.  As part of our continuing mission to provide you with exceptional heart care, we have created designated Provider Care Teams.  These Care Teams include your primary Cardiologist (physician) and Advanced Practice Providers (APPs -  Physician Assistants and Nurse Practitioners) who all work together to provide you with the care you need, when you need it.  You will need a follow up appointment in 12 months  Providers on your designated Care Team:   Christopher Berge, NP Ryan Dunn, PA-C Cadence Furth, PA-C  COVID-19 Vaccine Information can be found at: [email or web address] For questions related to vaccine distribution or appointments, please email [email or web address] or call [phone].   

## 2022-08-06 ENCOUNTER — Other Ambulatory Visit (INDEPENDENT_AMBULATORY_CARE_PROVIDER_SITE_OTHER): Payer: Medicare Other

## 2022-08-06 DIAGNOSIS — E039 Hypothyroidism, unspecified: Secondary | ICD-10-CM | POA: Diagnosis not present

## 2022-08-06 LAB — TSH: TSH: 4.7 u[IU]/mL (ref 0.35–5.50)

## 2022-08-06 NOTE — Telephone Encounter (Signed)
TSH was collected today

## 2022-08-12 ENCOUNTER — Other Ambulatory Visit: Payer: Self-pay | Admitting: Interventional Radiology

## 2022-08-12 ENCOUNTER — Ambulatory Visit
Admission: RE | Admit: 2022-08-12 | Discharge: 2022-08-12 | Disposition: A | Discharge status: Home / Self Care | Payer: Medicare Other | Source: Ambulatory Visit | Attending: Interventional Radiology | Admitting: Interventional Radiology

## 2022-08-12 DIAGNOSIS — K819 Cholecystitis, unspecified: Secondary | ICD-10-CM

## 2022-08-12 DIAGNOSIS — Z434 Encounter for attention to other artificial openings of digestive tract: Secondary | ICD-10-CM | POA: Diagnosis not present

## 2022-08-12 DIAGNOSIS — K8020 Calculus of gallbladder without cholecystitis without obstruction: Secondary | ICD-10-CM | POA: Diagnosis not present

## 2022-08-12 HISTORY — PX: IR EXCHANGE BILIARY DRAIN: IMG6046

## 2022-08-12 MED ORDER — IOHEXOL 300 MG/ML  SOLN
10.0000 mL | Freq: Once | INTRAMUSCULAR | Status: AC | PRN
  Administered 2022-08-12: 10 mL

## 2022-08-12 MED ORDER — LIDOCAINE HCL 1 % IJ SOLN
INTRAMUSCULAR | Status: AC
  Administered 2022-08-12: 5 mL
  Filled 2022-08-12: qty 20

## 2022-08-12 NOTE — Procedures (Signed)
Vascular and Interventional Radiology Procedure Note  Patient: Kylie May DOB: 07/22/1921 Medical Record Number: [id number] Note Date/Time: 08/12/22 12:19 PM   Performing Physician: Michaelle Birks, MD Assistant(s): None  Diagnosis: Comorbid with a chronic indwelling cholecystostomy. IR cholecystostomy placed 08/16/2018  Procedure:  CHOLECYSTOSTOMY TUBE EXCHANGE ANTEROGRADE CHOLANGIOGRAM  Anesthesia: Local Anesthetic Complications: None Estimated Blood Loss:  0 mL Specimens:  None  Findings:  Successful exchange for a new 19F cholecystostomy tube. Anterograde cholangiogram demonstrating a decompressed GB with a patent cystic duct.   Plan: Continue routine drain care. Consider capping trial, though family seems apprehensive to proceed with this. Follow up for routine tube evaluation in 6-8 week(s).   See detailed procedure note with images in PACS. The patient tolerated the procedure well without incident or complication and was returned to Recovery in stable condition.    Michaelle Birks, MD [facility]   Pager. [place]

## 2022-08-20 DIAGNOSIS — X32XXXA Exposure to sunlight, initial encounter: Secondary | ICD-10-CM | POA: Diagnosis not present

## 2022-08-20 DIAGNOSIS — L570 Actinic keratosis: Secondary | ICD-10-CM | POA: Diagnosis not present

## 2022-08-20 DIAGNOSIS — Z85828 Personal history of other malignant neoplasm of skin: Secondary | ICD-10-CM | POA: Diagnosis not present

## 2022-08-20 DIAGNOSIS — B078 Other viral warts: Secondary | ICD-10-CM | POA: Diagnosis not present

## 2022-08-20 DIAGNOSIS — D225 Melanocytic nevi of trunk: Secondary | ICD-10-CM | POA: Diagnosis not present

## 2022-08-20 DIAGNOSIS — D485 Neoplasm of uncertain behavior of skin: Secondary | ICD-10-CM | POA: Diagnosis not present

## 2022-08-20 DIAGNOSIS — C44629 Squamous cell carcinoma of skin of left upper limb, including shoulder: Secondary | ICD-10-CM | POA: Diagnosis not present

## 2022-08-20 DIAGNOSIS — C44519 Basal cell carcinoma of skin of other part of trunk: Secondary | ICD-10-CM | POA: Diagnosis not present

## 2022-08-20 DIAGNOSIS — L821 Other seborrheic keratosis: Secondary | ICD-10-CM | POA: Diagnosis not present

## 2022-08-20 DIAGNOSIS — D2262 Melanocytic nevi of left upper limb, including shoulder: Secondary | ICD-10-CM | POA: Diagnosis not present

## 2022-08-20 DIAGNOSIS — D0439 Carcinoma in situ of skin of other parts of face: Secondary | ICD-10-CM | POA: Diagnosis not present

## 2022-08-26 ENCOUNTER — Telehealth: Payer: Self-pay

## 2022-08-26 DIAGNOSIS — N390 Urinary tract infection, site not specified: Secondary | ICD-10-CM | POA: Diagnosis not present

## 2022-08-26 NOTE — Telephone Encounter (Signed)
Noted  

## 2022-08-26 NOTE — Telephone Encounter (Signed)
This patient needs to be seen today. I would suggest urgent care given lack of open clinic appointments today.

## 2022-08-26 NOTE — Telephone Encounter (Signed)
Spoke to Santiago Glad the Patient's other daughter( the original caller was Hassan Rowan the daughter). Santiago Glad states her Mom's concern is the UTI being back. Santiago Glad states since Dr. Caryl Bis stated she needs to go to Urgent Care today she will handle that but she is not sure that the correct symptoms was called in by Hassan Rowan the 1st caller.

## 2022-08-26 NOTE — Telephone Encounter (Signed)
Patient's daughter, Zackery Barefoot, called to state patient recently had a UTI and was told to call us if she was still having symptoms.  Hassan Rowan states patient has been feeling weak, light headed, and her blood pressure was a little high.  Hassan Rowan states patient asked her to call us.  Hassan Rowan states she is not sure if these symptoms are related to the UTI.  I transferred call to Access Nurse.

## 2022-08-27 NOTE — Telephone Encounter (Signed)
Attempted to call Patient 2 x and no answer so I sent a my chart message.

## 2022-08-27 NOTE — Telephone Encounter (Signed)
Called and left message for Patient to call the office

## 2022-08-27 NOTE — Telephone Encounter (Signed)
Please call and see how she is doing.  Please confirm what medication she was given.  It is ok to take tylenol with abx, but need a little more information regarding symptoms and how she is doing.  Is she having increased pain, etc?

## 2022-08-27 NOTE — Telephone Encounter (Signed)
Pt daughter called wanting to know if the pt can take a tylenol because she was given an antibiotic by [facility] when she was diagnosed with a UTI

## 2022-08-28 NOTE — Telephone Encounter (Signed)
Noted. Plan to discuss at follow-up though if she has worsening symptoms she should be reevaluated.

## 2022-08-28 NOTE — Telephone Encounter (Signed)
I told the daughter if she develop severe pain like 8 or higher, fever or any new symptoms then she would need to be seen right away. Daughter verbalized understanding and is agreeable.

## 2022-08-28 NOTE — Telephone Encounter (Signed)
Spoke to daughter they did give Tylenol for some discomfort. Pain level around a 3-4 per daughter but she also has other health issues that cause pain too. She is scheduled for a follow up with DR. Sonnenberg on 09/04/22 at 3:30

## 2022-09-04 ENCOUNTER — Ambulatory Visit (INDEPENDENT_AMBULATORY_CARE_PROVIDER_SITE_OTHER): Payer: Medicare Other | Admitting: Family Medicine

## 2022-09-04 VITALS — BP 112/66 | HR 57 | Ht 59.0 in | Wt 111.0 lb

## 2022-09-04 DIAGNOSIS — N3000 Acute cystitis without hematuria: Secondary | ICD-10-CM | POA: Diagnosis not present

## 2022-09-04 DIAGNOSIS — R5383 Other fatigue: Secondary | ICD-10-CM

## 2022-09-04 DIAGNOSIS — J309 Allergic rhinitis, unspecified: Secondary | ICD-10-CM

## 2022-09-04 DIAGNOSIS — K219 Gastro-esophageal reflux disease without esophagitis: Secondary | ICD-10-CM | POA: Diagnosis not present

## 2022-09-04 DIAGNOSIS — N390 Urinary tract infection, site not specified: Secondary | ICD-10-CM

## 2022-09-04 DIAGNOSIS — R1319 Other dysphagia: Secondary | ICD-10-CM

## 2022-09-04 MED ORDER — AZELASTINE HCL 0.1 % NA SOLN: 2.0000 | Freq: Two times a day (BID) | NASAL | 12 refills | Status: AC

## 2022-09-04 MED ORDER — OMEPRAZOLE 40 MG PO CPDR: 40.0000 mg | DELAYED_RELEASE_CAPSULE | Freq: Two times a day (BID) | ORAL | 1 refills | Status: DC

## 2022-09-04 NOTE — Patient Instructions (Signed)
Nice to see you. We will have you collect a urine at home an bring it to [place].  Please try the Astelin nasal spray to see if that helps with your allergy symptoms. You will take omeprazole 40 mg twice daily to see if that helps with some of your reflux symptoms. Please try taking Lactaid before you eat any dairy products.

## 2022-09-05 DIAGNOSIS — R5383 Other fatigue: Secondary | ICD-10-CM | POA: Insufficient documentation

## 2022-09-05 DIAGNOSIS — N390 Urinary tract infection, site not specified: Secondary | ICD-10-CM | POA: Diagnosis not present

## 2022-09-05 NOTE — Assessment & Plan Note (Signed)
This has been an ongoing issue.  We did lab work at her last visit that did not reveal a specific cause for her fatigue.  It is very possible that if she has had a UTI this entire time that can make her feel poorly.  This also could be the aging process.  Discussed monitoring at this time.

## 2022-09-05 NOTE — Progress Notes (Signed)
Tommi Rumps, MD Phone: [phone]  Kylie May is a 89 y.o. female who presents today for follow-up.  The patient is extremely hard of hearing so her daughter provides much of the history.  UTI: Patient was recently evaluated at urgent care for UTI.  She was given Bactrim.  She finished her antibiotics this past Monday.  The patient notes no dysuria or urinary frequency.  She does wear depends so it is a little difficult to determine how frequently she is urinating.    Fatigue: The patient has not been feeling all that well for some time now.  She has had some tiredness and weakness and lightheadedness at times.  She has had some decreased appetite.  She has had some coughing with sneezing over the last few days.  It seems as though she has been out of her Astelin.  When she coughs she spits up and gag some.  They do report some chronic dysphagia with having more difficulty getting stuff to go down but nothing does stick in her chest.  No reflux or abdominal pain.  She does have a hiatal hernia.  They report a history of dairy intolerance as well as a history of rash with Zyrtec use.  Social History   Tobacco Use  Smoking Status Former   Types: Cigarettes   Quit date: 06/04/1951   Years since quitting: 71.3  Smokeless Tobacco Never  Tobacco Comments   Smoked 1945-1953 , up to 2 cigarettes/day    Current Outpatient Medications on File Prior to Visit  Medication Sig Dispense Refill   acetaminophen (TYLENOL) 325 MG tablet Take 2 tablets (650 mg total) by mouth every 6 (six) hours as needed for mild pain (or Fever >/= 101).     amiodarone (PACERONE) 200 MG tablet Take 1 tablet (200 mg total) by mouth daily. 90 tablet 3   apixaban (ELIQUIS) 2.5 MG TABS tablet Take 1 tablet (2.5 mg total) by mouth 2 (two) times daily. 180 tablet 3   Famotidine (PEPCID PO) Take by mouth daily at 2 am.     ferrous sulfate 325 (65 FE) MG EC tablet Take 325 mg by mouth daily.     fluticasone (FLONASE)  50 MCG/ACT nasal spray Place 2 sprays into both nostrils daily. 16 g 11   hydrocortisone (ANUSOL-HC) 2.5 % rectal cream Place 1 application rectally 2 (two) times daily. 30 g 0   hydrocortisone (ANUSOL-HC) 25 MG suppository Place 1 suppository (25 mg total) rectally 2 (two) times daily as needed for hemorrhoids or anal itching. 14 suppository 1   levothyroxine (SYNTHROID) 50 MCG tablet Take 1 tablet (50 mcg total) by mouth daily. 90 tablet 3   magnesium chloride (SLOW-MAG) 64 MG TBEC SR tablet Take 2 tablets (128 mg total) by mouth daily. 60 tablet 3   metoprolol tartrate (LOPRESSOR) 25 MG tablet Take 1 tablet (25 mg total) by mouth 2 (two) times daily. 180 tablet 0   nystatin cream (MYCOSTATIN) nystatin 100,000 unit/gram topical cream  APPLY TOPICALLY TO THE AFFECTED AREA TWICE DAILY     Probiotic Product (PROBIOTIC PO) Take by mouth daily at 2 am.     benzonatate (TESSALON) 100 MG capsule Take 1 capsule (100 mg total) by mouth 3 (three) times daily as needed for cough. 20 capsule 1   cefdinir (OMNICEF) 300 MG capsule Take 1 capsule (300 mg total) by mouth daily. 7 capsule 0   OLANZapine (ZYPREXA) 2.5 MG tablet Take 1 tablet (2.5 mg total) by mouth at  bedtime. 30 tablet 0   sodium chloride 1 g tablet Take 1 tablet (1 g total) by mouth 2 (two) times daily with a meal. 60 tablet 0   Sodium Chloride Flush (NORMAL SALINE FLUSH) 0.9 % SOLN FLUSH ONCE DAILY AS DIRECTED 1000 mL 3   traMADol (ULTRAM) 50 MG tablet Take 50 mg by mouth every 6 (six) hours as needed.     No current facility-administered medications on file prior to visit.     ROS see history of present illness  Objective  Physical Exam Vitals:   09/04/22 1607  BP: 112/66  Pulse: (!) 57  SpO2: 96%    BP Readings from Last 3 Encounters:  09/04/22 112/66  08/02/22 110/70  07/01/22 110/70   Wt Readings from Last 3 Encounters:  09/04/22 111 lb (50.3 kg)  08/02/22 112 lb (50.8 kg)  07/01/22 110 lb 8 oz (50.1 kg)    Physical  Exam Constitutional:      General: She is not in acute distress.    Appearance: She is not diaphoretic.  Cardiovascular:     Rate and Rhythm: Normal rate and regular rhythm.     Heart sounds: Normal heart sounds.  Pulmonary:     Effort: Pulmonary effort is normal.     Breath sounds: Normal breath sounds.  Abdominal:     General: Bowel sounds are normal. There is no distension.     Palpations: Abdomen is soft.     Tenderness: There is no abdominal tenderness.  Skin:    General: Skin is warm and dry.  Neurological:     Mental Status: She is alert.      Assessment/Plan: Please see individual problem list.  Gastroesophageal reflux disease, unspecified whether esophagitis present -     Omeprazole; Take 1 capsule (40 mg total) by mouth in the morning and at bedtime. TAKE 1 CAPSULE DAILY (PLEASE SCHEDULE OFFICE VISIT FOR FURTHER REFILLS)  Dispense: 180 capsule; Refill: 1  Allergic rhinitis, unspecified seasonality, unspecified trigger Assessment & Plan: Chronic issue.  I suspect the patient's recent symptoms are allergy related with sneezing and coughing.  She will continue Flonase.  We will refill Astelin for her to restart that.  Oral antihistamines are not an option given history of rash with Zyrtec.  Orders: -     Azelastine HCl; Place 2 sprays into both nostrils 2 (two) times daily. Use in each nostril as directed  Dispense: 30 mL; Refill: 12  Recurrent UTI -     Ambulatory referral to Urology -     POCT urinalysis dipstick; Future -     Urine Culture; Future -     Urine Microscopic; Future  Esophageal dysphagia Assessment & Plan: This is a chronic intermittent issue.  Continues to have some issues with this as well as possible reflux.  Symptoms could be related to her hiatal hernia.  We will try omeprazole 40 mg twice daily along with Pepcid at night to see if that makes any difference.  We also discussed using Lactaid pills to see if that helps with some of her GI  issues.   Acute cystitis without hematuria Assessment & Plan: Patient recently treated for UTI.  Discussed it is a little difficult to say if she truly has a UTI or not given it is somewhat difficult to quantify her urinary symptoms.  I had a long discussion with the patient's daughter noting that typically to have a UTI you have to have a certain amount of bacteria in  the urine and have urinary symptoms.  If patient has bacteria in the urine and does not have urinary symptoms then they are likely colonized with bacteria.  We attempted to get a urine today though this became contaminated with stool.  They will be sent home with urine collection materials and were advised to bring the urine back as quickly as possible after it is collected.  Given issues with UTIs since the beginning of January we will refer to urology to get their input.  Discussed that the duration of antibiotics that she has had on each treatment is typically enough to adequately treat a UTI.   Other fatigue Assessment & Plan: This has been an ongoing issue.  We did lab work at her last visit that did not reveal a specific cause for her fatigue.  It is very possible that if she has had a UTI this entire time that can make her feel poorly.  This also could be the aging process.  Discussed monitoring at this time.     Return in about 1 month (around 10/04/2022).   Tommi Rumps, MD [place]

## 2022-09-05 NOTE — Addendum Note (Signed)
Addended by: Leeanne Rio on: 09/05/2022 04:47 PM   Modules accepted: Orders

## 2022-09-05 NOTE — Assessment & Plan Note (Addendum)
Patient recently treated for UTI.  Discussed it is a little difficult to say if she truly has a UTI or not given it is somewhat difficult to quantify her urinary symptoms.  I had a long discussion with the patient's daughter noting that typically to have a UTI you have to have a certain amount of bacteria in the urine and have urinary symptoms.  If patient has bacteria in the urine and does not have urinary symptoms then they are likely colonized with bacteria.  We attempted to get a urine today though this became contaminated with stool.  They will be sent home with urine collection materials and were advised to bring the urine back as quickly as possible after it is collected.  Given issues with UTIs since the beginning of January we will refer to urology to get their input.  Discussed that the duration of antibiotics that she has had on each treatment is typically enough to adequately treat a UTI.

## 2022-09-05 NOTE — Assessment & Plan Note (Addendum)
Chronic issue.  I suspect the patient's recent symptoms are allergy related with sneezing and coughing.  She will continue Flonase.  We will refill Astelin for her to restart that.  Oral antihistamines are not an option given history of rash with Zyrtec.

## 2022-09-05 NOTE — Assessment & Plan Note (Addendum)
This is a chronic intermittent issue.  Continues to have some issues with this as well as possible reflux.  Symptoms could be related to her hiatal hernia.  We will try omeprazole 40 mg twice daily along with Pepcid at night to see if that makes any difference.  We also discussed using Lactaid pills to see if that helps with some of her GI issues.

## 2022-09-06 LAB — URINALYSIS, ROUTINE W REFLEX MICROSCOPIC
Bilirubin Urine: NEGATIVE
Hgb urine dipstick: NEGATIVE
Ketones, ur: NEGATIVE
Leukocytes,Ua: NEGATIVE
Nitrite: NEGATIVE
Specific Gravity, Urine: 1.02 (ref 1.000–1.030)
Total Protein, Urine: NEGATIVE
Urine Glucose: NEGATIVE
Urobilinogen, UA: 0.2 (ref 0.0–1.0)
pH: 6 (ref 5.0–8.0)

## 2022-09-07 LAB — URINE CULTURE
MICRO NUMBER:: 14783070
SPECIMEN QUALITY:: ADEQUATE

## 2022-09-09 ENCOUNTER — Other Ambulatory Visit: Payer: Self-pay | Admitting: Family Medicine

## 2022-09-09 MED ORDER — CEFDINIR 300 MG PO CAPS: 300.0000 mg | ORAL_CAPSULE | Freq: Every day | ORAL | 0 refills | Status: DC

## 2022-09-09 NOTE — Telephone Encounter (Signed)
Pt daughter called stating someone called her from the office stating the pt was going to get ten pills of cefdinir but when she went to the pharmacy it was only seven

## 2022-09-10 NOTE — Telephone Encounter (Signed)
Let Patient's daughter Clydie Braun know that 3 more days of antibiotics was called in

## 2022-09-19 NOTE — Progress Notes (Signed)
Patient for IR Bil Drain exchange on Friday 09/20/2022, I called and spoke with the patient's daughter, Clydie Braun on the phone and gave pre-procedure instructions. Clydie Braun was made aware to have the patient here at 11:30a. Clydie Braun stated understanding.  Called 09/19/2022

## 2022-09-20 ENCOUNTER — Other Ambulatory Visit: Payer: Self-pay | Admitting: Interventional Radiology

## 2022-09-20 ENCOUNTER — Ambulatory Visit
Admission: RE | Admit: 2022-09-20 | Discharge: 2022-09-20 | Disposition: A | Discharge status: Home / Self Care | Payer: Medicare Other | Source: Ambulatory Visit | Attending: Interventional Radiology | Admitting: Interventional Radiology

## 2022-09-20 DIAGNOSIS — Z434 Encounter for attention to other artificial openings of digestive tract: Secondary | ICD-10-CM | POA: Insufficient documentation

## 2022-09-20 DIAGNOSIS — K819 Cholecystitis, unspecified: Secondary | ICD-10-CM

## 2022-09-20 HISTORY — PX: IR EXCHANGE BILIARY DRAIN: IMG6046

## 2022-09-20 MED ORDER — LIDOCAINE HCL 1 % IJ SOLN: 5.0000 mL | Freq: Once | INTRAMUSCULAR | Status: DC

## 2022-09-20 MED ORDER — IOHEXOL 300 MG/ML  SOLN
8.0000 mL | Freq: Once | INTRAMUSCULAR | Status: AC | PRN
  Administered 2022-09-20: 8 mL

## 2022-09-20 MED ORDER — LIDOCAINE HCL 1 % IJ SOLN
INTRAMUSCULAR | Status: AC
  Filled 2022-09-20: qty 20

## 2022-09-20 NOTE — Procedures (Signed)
Interventional Radiology Procedure:   Indications: Chronic cholecystostomy tube, needs routine exchange  Procedure: Cholecystostomy tube exchange  Findings: Old tube was partially occluded. New 12 Fr drain in gallbladder.  No filling of cystic duct or CBD.  Complications: None     EBL: Minimal, less than 10 ml  Plan: Keep drain to gravity bag and continue with flushes twice a day.   Bretton Tandy R. Lowella Dandy, MD  Pager: [phone]

## 2022-09-25 ENCOUNTER — Other Ambulatory Visit (HOSPITAL_COMMUNITY): Payer: Self-pay

## 2022-10-02 ENCOUNTER — Ambulatory Visit (INDEPENDENT_AMBULATORY_CARE_PROVIDER_SITE_OTHER): Payer: Medicare Other | Admitting: Family Medicine

## 2022-10-02 ENCOUNTER — Encounter: Payer: Self-pay | Admitting: Family Medicine

## 2022-10-02 VITALS — BP 128/82 | HR 57 | Temp 97.7°F | Ht 59.0 in | Wt 112.0 lb

## 2022-10-02 DIAGNOSIS — R5383 Other fatigue: Secondary | ICD-10-CM | POA: Diagnosis not present

## 2022-10-02 DIAGNOSIS — R1319 Other dysphagia: Secondary | ICD-10-CM | POA: Diagnosis not present

## 2022-10-02 DIAGNOSIS — M7989 Other specified soft tissue disorders: Secondary | ICD-10-CM | POA: Insufficient documentation

## 2022-10-02 DIAGNOSIS — M419 Scoliosis, unspecified: Secondary | ICD-10-CM

## 2022-10-02 DIAGNOSIS — N390 Urinary tract infection, site not specified: Secondary | ICD-10-CM | POA: Diagnosis not present

## 2022-10-02 DIAGNOSIS — J309 Allergic rhinitis, unspecified: Secondary | ICD-10-CM

## 2022-10-02 LAB — POCT URINALYSIS DIPSTICK
Bilirubin, UA: NEGATIVE
Blood, UA: NEGATIVE
Glucose, UA: NEGATIVE
Ketones, UA: NEGATIVE
Leukocytes, UA: NEGATIVE
Nitrite, UA: NEGATIVE
Protein, UA: NEGATIVE
Spec Grav, UA: 1.015 (ref 1.010–1.025)
Urobilinogen, UA: 0.2 E.U./dL
pH, UA: 5.5 (ref 5.0–8.0)

## 2022-10-02 MED ORDER — MONTELUKAST SODIUM 10 MG PO TABS
10.0000 mg | ORAL_TABLET | Freq: Every day | ORAL | 3 refills | Status: DC
Start: 2022-10-02 — End: 2023-01-30

## 2022-10-02 MED ORDER — CLARITIN 5 MG PO CHEW
5.0000 mg | CHEWABLE_TABLET | Freq: Every day | ORAL | 1 refills | Status: DC
Start: 2022-10-02 — End: 2023-01-30

## 2022-10-02 NOTE — Progress Notes (Signed)
Marikay Alar, MD Phone: [phone]  Kylie May is a 89 y.o. female who presents today for f/u.  Recurrent UTI: Patient notes no dysuria.  No change in urinary frequency.  No urinary urgency.  Her daughter does note yesterday that they were walking around a plant nursery and the patient felt fairly fatigued with this.  She noted no chest pain or shortness of breath.  The daughter wonders if this could be related to her developing UTI symptoms again.  Chronic leg swelling: This occurs in both legs the left greater than right.  It has occurred off and on for quite a while.  It does go down overnight.  No orthopnea.  Patient reported an odd sensation to her family earlier today and request her blood pressure and oxygen being checked.  Her blood pressure was in the 130s over 80s.  Right now the patient has a hard time describing how she felt though she notes she does not feel as lightheaded and does not feel funny as she did earlier in the day.  Reflux/dysphagia: Patient notes intermittent issues with nausea and spitting up after eating.  She notes no abdominal pain.  She has seen GI for this in the past and they noted an endoscopy was not indicated at that time.  She is taking omeprazole.  Allergic rhinitis: Patient has chronic cough possibly from allergies.  She reports stress incontinence related to the cough.  She is not able to take antihistamines given having a rash and other bad reaction with an antihistamine in the past.  Currently she is using Flonase daily.  Social History   Tobacco Use  Smoking Status Former   Types: Cigarettes   Quit date: 06/04/1951   Years since quitting: 71.3  Smokeless Tobacco Never  Tobacco Comments   Smoked 1945-1953 , up to 2 cigarettes/day    Current Outpatient Medications on File Prior to Visit  Medication Sig Dispense Refill   acetaminophen (TYLENOL) 325 MG tablet Take 2 tablets (650 mg total) by mouth every 6 (six) hours as needed for mild  pain (or Fever >/= 101).     amiodarone (PACERONE) 200 MG tablet Take 1 tablet (200 mg total) by mouth daily. 90 tablet 3   apixaban (ELIQUIS) 2.5 MG TABS tablet Take 1 tablet (2.5 mg total) by mouth 2 (two) times daily. 180 tablet 3   azelastine (ASTELIN) 0.1 % nasal spray Place 2 sprays into both nostrils 2 (two) times daily. Use in each nostril as directed 30 mL 12   Famotidine (PEPCID PO) Take by mouth daily at 2 am.     ferrous sulfate 325 (65 FE) MG EC tablet Take 325 mg by mouth daily.     fluticasone (FLONASE) 50 MCG/ACT nasal spray Place 2 sprays into both nostrils daily. 16 g 11   hydrocortisone (ANUSOL-HC) 2.5 % rectal cream Place 1 application rectally 2 (two) times daily. 30 g 0   hydrocortisone (ANUSOL-HC) 25 MG suppository Place 1 suppository (25 mg total) rectally 2 (two) times daily as needed for hemorrhoids or anal itching. 14 suppository 1   levothyroxine (SYNTHROID) 50 MCG tablet Take 1 tablet (50 mcg total) by mouth daily. 90 tablet 3   magnesium chloride (SLOW-MAG) 64 MG TBEC SR tablet Take 2 tablets (128 mg total) by mouth daily. 60 tablet 3   metoprolol tartrate (LOPRESSOR) 25 MG tablet Take 1 tablet (25 mg total) by mouth 2 (two) times daily. 180 tablet 0   nystatin cream (MYCOSTATIN) nystatin 100,000  unit/gram topical cream  APPLY TOPICALLY TO THE AFFECTED AREA TWICE DAILY     omeprazole (PRILOSEC) 40 MG capsule Take 1 capsule (40 mg total) by mouth in the morning and at bedtime. TAKE 1 CAPSULE DAILY (PLEASE SCHEDULE OFFICE VISIT FOR FURTHER REFILLS) 180 capsule 1   Probiotic Product (PROBIOTIC PO) Take by mouth daily at 2 am.     No current facility-administered medications on file prior to visit.     ROS see history of present illness  Objective  Physical Exam Vitals:   10/02/22 1452  BP: 128/82  Pulse: (!) 57  Temp: 97.7 F (36.5 C)  SpO2: 99%    BP Readings from Last 3 Encounters:  10/02/22 128/82  09/04/22 112/66  08/02/22 110/70   Wt Readings  from Last 3 Encounters:  10/02/22 112 lb (50.8 kg)  09/04/22 111 lb (50.3 kg)  08/02/22 112 lb (50.8 kg)    Physical Exam Constitutional:      General: She is not in acute distress.    Appearance: She is not diaphoretic.  Cardiovascular:     Rate and Rhythm: Normal rate and regular rhythm.     Heart sounds: Normal heart sounds.  Pulmonary:     Effort: Pulmonary effort is normal.     Breath sounds: Normal breath sounds.  Musculoskeletal:     Comments: 1+ pitting edema at her left ankle and foot, trace pitting edema right foot and ankle  Skin:    General: Skin is warm and dry.  Neurological:     Mental Status: She is alert.      Assessment/Plan: Please see individual problem list.  Recurrent UTI Assessment & Plan: Patient with no urinary symptoms though in the past she has had more global symptoms with her UTIs.  Will check a urinalysis today.  She will see urology as planned.  Orders: -     POCT urinalysis dipstick  Allergic rhinitis, unspecified seasonality, unspecified trigger Assessment & Plan: Chronic issue.  She will continue Flonase 2 spray each nostril twice daily.  We will trial Singulair to see if that helps with her symptoms.  Orders: -     Claritin; Chew 1 tablet (5 mg total) by mouth daily.  Dispense: 90 tablet; Refill: 1 -     Montelukast Sodium; Take 1 tablet (10 mg total) by mouth at bedtime.  Dispense: 30 tablet; Refill: 3  Esophageal dysphagia Assessment & Plan: Chronic intermittent issue.  She will continue omeprazole 40 mg twice daily.  I did discuss having her do a speech therapy swallow study to see if we can have a little better understanding of what is going on with her swallowing.  Her daughter will discuss with family members and we can place the referral if they would like.   Scoliosis of lumbar spine, unspecified scoliosis type Assessment & Plan: Patient reports chronic back pain.  This is likely related to her scoliosis.  It is unchanged.   She can monitor and take Tylenol over-the-counter for this.   Leg swelling Assessment & Plan: I suspect related to venous insufficiency.  She does not have any cardiac symptoms.  Recent TSH, CBC, and CMP unrevealing for cause.  They will try to keep her legs elevated.   Other fatigue Assessment & Plan: Chronic issue.  Prior lab work did not reveal a cause for her symptoms.  This is likely related to the aging process.  We will monitor at this time.     Return in about 3  months (around 01/02/2023).  I have spent 43 minutes in the care of this patient regarding history taking, documentation, completion of exam, discussion of plan, placing orders.   Marikay Alar, MD [facility]

## 2022-10-02 NOTE — Assessment & Plan Note (Signed)
I suspect related to venous insufficiency.  She does not have any cardiac symptoms.  Recent TSH, CBC, and CMP unrevealing for cause.  They will try to keep her legs elevated.

## 2022-10-02 NOTE — Assessment & Plan Note (Signed)
Patient with no urinary symptoms though in the past she has had more global symptoms with her UTIs.  Will check a urinalysis today.  She will see urology as planned.

## 2022-10-02 NOTE — Assessment & Plan Note (Signed)
Chronic issue.  She will continue Flonase 2 spray each nostril twice daily.  We will trial Singulair to see if that helps with her symptoms.

## 2022-10-02 NOTE — Assessment & Plan Note (Signed)
Chronic intermittent issue.  She will continue omeprazole 40 mg twice daily.  I did discuss having her do a speech therapy swallow study to see if we can have a little better understanding of what is going on with her swallowing.  Her daughter will discuss with family members and we can place the referral if they would like.

## 2022-10-02 NOTE — Assessment & Plan Note (Signed)
Patient reports chronic back pain.  This is likely related to her scoliosis.  It is unchanged.  She can monitor and take Tylenol over-the-counter for this.

## 2022-10-02 NOTE — Patient Instructions (Addendum)
Nice to see you. I sent singulair in for you to try for your cough as it could be related to allergies. We will check your urine and see what this reveals. Please see the urologist as planned. You have seen GI in the past for the spitting up issue and the swallowing issue they noted it was not related to a stricture in your esophagus. If you would like we could refer you for a swallowing study to evaluate your swallowing issue further.

## 2022-10-04 ENCOUNTER — Ambulatory Visit: Payer: Self-pay | Admitting: Urology

## 2022-10-04 NOTE — Assessment & Plan Note (Signed)
Chronic issue.  Prior lab work did not reveal a cause for her symptoms.  This is likely related to the aging process.  We will monitor at this time.

## 2022-10-05 ENCOUNTER — Other Ambulatory Visit: Payer: Self-pay | Admitting: Family Medicine

## 2022-10-05 ENCOUNTER — Other Ambulatory Visit (HOSPITAL_COMMUNITY): Payer: Self-pay

## 2022-10-07 ENCOUNTER — Other Ambulatory Visit (HOSPITAL_COMMUNITY): Payer: Self-pay

## 2022-10-07 ENCOUNTER — Other Ambulatory Visit: Payer: Self-pay | Admitting: Family Medicine

## 2022-10-07 ENCOUNTER — Ambulatory Visit: Payer: Medicare Other | Admitting: Radiology

## 2022-10-07 NOTE — Telephone Encounter (Signed)
What is this for? It is not something that I have prescribed for her previously and is not something that I typically prescribe. Is this something that was coming from her surgeon/GI person for her drain?

## 2022-10-07 NOTE — Telephone Encounter (Signed)
Prescription Request  10/07/2022  LOV: 10/02/2022  What is the name of the medication or equipment? Sodium Chloride Flush 0.9%  Have you contacted your pharmacy to request a refill? Yes   Which pharmacy would you like this sent to?   [facility] - [place], [place] - [address] [place] [address] Phone: [phone] Fax: [phone]    Patient notified that their request is being sent to the clinical staff for review and that they should receive a response within 2 business days.   Please advise at [facility] [phone]   As per pt daughter, she's running low on this flush. Any questions she's available @[phone]

## 2022-10-08 NOTE — Telephone Encounter (Signed)
This flush is used to flush her cholecystostomy tube out daily.

## 2022-10-09 ENCOUNTER — Other Ambulatory Visit: Payer: Self-pay

## 2022-10-09 ENCOUNTER — Other Ambulatory Visit (HOSPITAL_COMMUNITY): Payer: Self-pay

## 2022-10-09 MED ORDER — NORMAL SALINE FLUSH 0.9 % IV SOLN
INTRAVENOUS | 3 refills | Status: DC
  Filled 2022-10-09: qty 500, 50d supply, fill #0
  Filled 2022-11-28: qty 500, 50d supply, fill #1
  Filled 2023-01-21: qty 500, 50d supply, fill #2

## 2022-10-10 ENCOUNTER — Other Ambulatory Visit (HOSPITAL_COMMUNITY): Payer: Self-pay

## 2022-10-14 DIAGNOSIS — D0462 Carcinoma in situ of skin of left upper limb, including shoulder: Secondary | ICD-10-CM | POA: Diagnosis not present

## 2022-10-14 DIAGNOSIS — C44629 Squamous cell carcinoma of skin of left upper limb, including shoulder: Secondary | ICD-10-CM | POA: Diagnosis not present

## 2022-10-18 ENCOUNTER — Encounter: Payer: Self-pay | Admitting: Urology

## 2022-10-18 ENCOUNTER — Ambulatory Visit (INDEPENDENT_AMBULATORY_CARE_PROVIDER_SITE_OTHER): Payer: Medicare Other | Admitting: Urology

## 2022-10-18 DIAGNOSIS — Z87440 Personal history of urinary (tract) infections: Secondary | ICD-10-CM | POA: Diagnosis not present

## 2022-10-18 DIAGNOSIS — N390 Urinary tract infection, site not specified: Secondary | ICD-10-CM | POA: Diagnosis not present

## 2022-10-18 DIAGNOSIS — R8271 Bacteriuria: Secondary | ICD-10-CM | POA: Diagnosis not present

## 2022-10-18 LAB — URINALYSIS, COMPLETE
Bilirubin, UA: NEGATIVE
Glucose, UA: NEGATIVE
Ketones, UA: NEGATIVE
Leukocytes,UA: NEGATIVE
Nitrite, UA: NEGATIVE
Protein,UA: NEGATIVE
RBC, UA: NEGATIVE
Specific Gravity, UA: 1.015 (ref 1.005–1.030)
Urobilinogen, Ur: 0.2 mg/dL (ref 0.2–1.0)
pH, UA: 5.5 (ref 5.0–7.5)

## 2022-10-18 LAB — MICROSCOPIC EXAMINATION: RBC, Urine: NONE SEEN /hpf (ref 0–2)

## 2022-10-18 NOTE — Patient Instructions (Signed)
Take over the counter D-Mannose and cranberry tablets daily for UTI prevention.

## 2022-10-18 NOTE — Progress Notes (Signed)
I, Kylie May,acting as a scribe for Kylie Altes, MD.,have documented all relevant documentation on the behalf of Kylie Altes, MD,as directed by  Kylie Altes, MD while in the presence of Kylie Altes, MD.  10/18/2022 10:45 AM   Kylie May 11-09-1921 [id number]  Referring provider: [name], MD [address] [place],  [place] [address]  Chief Complaint  Patient presents with   Recurrent UTI   New Patient (Initial Visit)    HPI: Kylie May is a 89 y.o. female for evaluation of recurrent UTIs.  Has had 5 positive cultures for E. coli since January 2024. Presents today with her daughter who provided the history. Has not had classic UTI symptoms with primarily complaints of fatigue and lightheadedness. No febrile UTIs or pyelonephritis. No gross hematuria. UA 10/02/22 was negative. No complaints today.   PMH: Past Medical History:  Diagnosis Date   A-fib (HCC)    Acid reflux disease    Arrhythmia    Atrial fibrillation with RVR (HCC) 08/15/2018   Chronic diastolic CHF (congestive heart failure) (HCC)    Compression fracture of lumbar vertebra (HCC)    Diverticulitis 01-2008   [facility]   Diverticulosis    Esophageal stricture 2006   Femoral bruit    Right   Fibrocystic breast disease    Hiatal hernia    History of esophageal stricture 06/14/2004   2006 esophageal dilation Schatzki's ring    History of skin cancer 12/29/2008   Basal cell cancer right glabella 07/10/2010 Dr. Adolphus Birchwood, [facility] @ [place] , [place]    Hypercholesterolemia    [place] study LDL goal = < 160   Mitral regurgitation    a. 06/2014 EF 55-60%, elevated end-diastolic pressures, dilated LA at 4.3 cm, mildly dilated RA, severe mitral regurgitation, mild aortic sclerosis without stenosis, mod-severe TR   Mitral valve prolapse    Osteoporosis    Dr Marcelle Overlie   Pancreatitis 07-2008   Hospitalized    Patella fracture 05/13/2018   Schatzki's ring     Sepsis (HCC) 08/15/2018   Skin cancer    facial x 2. Dr Adolphus Birchwood    Surgical History: Past Surgical History:  Procedure Laterality Date   BILIARY STENT PLACEMENT  08/21/2018   Procedure: BILIARY STENT PLACEMENT;  Surgeon: Kerin Salen, MD;  Location: [facility] ENDOSCOPY;  Service: Gastroenterology;;   COLONOSCOPY  2006   Epidural steroids     x1   ERCP N/A 08/21/2018   Procedure: ENDOSCOPIC RETROGRADE CHOLANGIOPANCREATOGRAPHY (ERCP);  Surgeon: Kerin Salen, MD;  Location: [facility] ENDOSCOPY;  Service: Gastroenterology;  Laterality: N/A;   ESOPHAGEAL DILATION  2006   ESOPHAGOGASTRODUODENOSCOPY (EGD) WITH PROPOFOL N/A 11/16/2018   Procedure: ESOPHAGOGASTRODUODENOSCOPY (EGD) WITH PROPOFOL;  Surgeon: Toney Reil, MD;  Location: [facility] ENDOSCOPY;  Service: Gastroenterology;  Laterality: N/A;   G3 P4 (twins)     IR CHOLANGIOGRAM EXISTING TUBE  08/18/2018   IR CHOLANGIOGRAM EXISTING TUBE  07/16/2022   IR EXCHANGE BILIARY DRAIN  11/03/2018   IR EXCHANGE BILIARY DRAIN  12/01/2018   IR EXCHANGE BILIARY DRAIN  05/13/2019   IR EXCHANGE BILIARY DRAIN  06/14/2019   IR EXCHANGE BILIARY DRAIN  07/19/2019   IR EXCHANGE BILIARY DRAIN  08/17/2019   IR EXCHANGE BILIARY DRAIN  09/30/2019   IR EXCHANGE BILIARY DRAIN  12/08/2019   IR EXCHANGE BILIARY DRAIN  01/19/2020   IR EXCHANGE BILIARY DRAIN  03/01/2020   IR EXCHANGE BILIARY DRAIN  04/20/2020  IR EXCHANGE BILIARY DRAIN  07/06/2020   IR EXCHANGE BILIARY DRAIN  10/26/2020   IR EXCHANGE BILIARY DRAIN  01/18/2021   IR EXCHANGE BILIARY DRAIN  03/05/2021   IR EXCHANGE BILIARY DRAIN  05/25/2021   IR EXCHANGE BILIARY DRAIN  08/03/2021   IR EXCHANGE BILIARY DRAIN  09/28/2021   IR EXCHANGE BILIARY DRAIN  11/23/2021   IR EXCHANGE BILIARY DRAIN  12/27/2021   IR EXCHANGE BILIARY DRAIN  02/20/2022   IR EXCHANGE BILIARY DRAIN  04/17/2022   IR EXCHANGE BILIARY DRAIN  06/17/2022   IR EXCHANGE BILIARY DRAIN  07/12/2022   IR EXCHANGE BILIARY DRAIN  08/12/2022   IR EXCHANGE BILIARY DRAIN  09/20/2022    IR PERC CHOLECYSTOSTOMY  08/16/2018   IR RADIOLOGIST EVAL & MGMT  11/08/2021   REMOVAL OF STONES  08/21/2018   Procedure: REMOVAL OF STONES;  Surgeon: Kerin Salen, MD;  Location: [facility] ENDOSCOPY;  Service: Gastroenterology;;   SKIN CANCER EXCISION     SPHINCTEROTOMY  08/21/2018   Procedure: Dennison Mascot;  Surgeon: Kerin Salen, MD;  Location: [facility] ENDOSCOPY;  Service: Gastroenterology;;    Home Medications:  Allergies as of 10/18/2022       Reactions   Metronidazole    Other reaction(s): Dizziness and giddiness (finding), Other (qualifier value) made things feel like they were vibrating like the bed and floor,also caused mild dizziness weakness   Penicillin G Rash        Medication List        Accurate as of Oct 18, 2022 10:45 AM. If you have any questions, ask your nurse or doctor.          STOP taking these medications    hydrocortisone 2.5 % rectal cream Commonly known as: Anusol-HC Stopped by: Kylie Altes, MD   PEPCID PO Stopped by: Kylie Altes, MD       TAKE these medications    acetaminophen 325 MG tablet Commonly known as: TYLENOL Take 2 tablets (650 mg total) by mouth every 6 (six) hours as needed for mild pain (or Fever >/= 101).   amiodarone 200 MG tablet Commonly known as: Pacerone Take 1 tablet (200 mg total) by mouth daily.   apixaban 2.5 MG Tabs tablet Commonly known as: ELIQUIS Take 1 tablet (2.5 mg total) by mouth 2 (two) times daily.   azelastine 0.1 % nasal spray Commonly known as: ASTELIN Place 2 sprays into both nostrils 2 (two) times daily. Use in each nostril as directed   Claritin 5 MG chewable tablet Generic drug: loratadine Chew 1 tablet (5 mg total) by mouth daily.   ferrous sulfate 325 (65 FE) MG EC tablet Take 325 mg by mouth daily.   fluticasone 50 MCG/ACT nasal spray Commonly known as: FLONASE Place 2 sprays into both nostrils daily.   hydrocortisone 25 MG suppository Commonly known as: ANUSOL-HC Place 1  suppository (25 mg total) rectally 2 (two) times daily as needed for hemorrhoids or anal itching.   levothyroxine 50 MCG tablet Commonly known as: SYNTHROID Take 1 tablet (50 mcg total) by mouth daily.   magnesium chloride 64 MG Tbec SR tablet Commonly known as: SLOW-MAG Take 2 tablets (128 mg total) by mouth daily.   metoprolol tartrate 25 MG tablet Commonly known as: LOPRESSOR Take 1 tablet (25 mg total) by mouth 2 (two) times daily.   montelukast 10 MG tablet Commonly known as: SINGULAIR Take 1 tablet (10 mg total) by mouth at bedtime.   Normal Saline Flush 0.9 % Soln  FLUSH ONCE DAILY AS DIRECTED   nystatin cream Commonly known as: MYCOSTATIN nystatin 100,000 unit/gram topical cream  APPLY TOPICALLY TO THE AFFECTED AREA TWICE DAILY   omeprazole 40 MG capsule Commonly known as: PRILOSEC Take 1 capsule (40 mg total) by mouth in the morning and at bedtime. TAKE 1 CAPSULE DAILY (PLEASE SCHEDULE OFFICE VISIT FOR FURTHER REFILLS)   PROBIOTIC PO Take by mouth daily at 2 am.        Allergies:  Allergies  Allergen Reactions   Metronidazole     Other reaction(s): Dizziness and giddiness (finding), Other (qualifier value) made things feel like they were vibrating like the bed and floor,also caused mild dizziness weakness   Penicillin G Rash    Family History: Family History  Problem Relation Age of Onset   Breast cancer Maternal Aunt    Hepatitis Daughter        C   Lung disease Mother        lung tumor   Heart attack Daughter 41       S/P stents   Liver cancer Daughter    Heart attack Sister 47   Diabetes Neg Hx    Stroke Neg Hx     Social History:  reports that she quit smoking about 71 years ago. Her smoking use included cigarettes. She has been exposed to tobacco smoke. She has never used smokeless tobacco. She reports current alcohol use of about 1.0 standard drink of alcohol per week. She reports that she does not use drugs.   Physical Exam: There were  no vitals taken for this visit.  Constitutional:  Alert and oriented, No acute distress. HEENT: [place] AT, moist mucus membranes.  Trachea midline, no masses. Cardiovascular: No clubbing, cyanosis, or edema. Respiratory: Normal respiratory effort, no increased work of breathing. GI: Abdomen is soft, nontender, nondistended, no abdominal masses Skin: No rashes, bruises or suspicious lesions. Neurologic: Grossly intact, no focal deficits, moving all 4 extremities. Psychiatric: Normal mood and affect.   Urinalysis Dipstick/microscopy negative   Assessment & Plan:    1. Recurrent bacteriuria  She does not have classic UTI symptoms and unclear if her previous presenting symptoms are related to UTI versus having asymptomatic bacteriuria  PVR today*** Recommend renal ultrasound to evaluate for any anatomic abnormalities that would predispose to bacteria- order placed and we'll call with results.  We discuss oral supplements, which may be effective in UTI prevention, including cranberry and D-mannose.  Her daughter asked about a low-dose antibiotic prophylaxis nda since it is unclear if she has symptomatic infections versus asymptomatic bacteria, I would not recommend low-dose antibiotic suppression at this time.  [facility] [address] [place], [place] [address] [phone]

## 2022-10-24 ENCOUNTER — Ambulatory Visit
Admission: RE | Admit: 2022-10-24 | Discharge: 2022-10-24 | Disposition: A | Discharge status: Home / Self Care | Payer: Medicare Other | Source: Ambulatory Visit | Attending: Urology | Admitting: Urology

## 2022-10-24 DIAGNOSIS — N200 Calculus of kidney: Secondary | ICD-10-CM | POA: Diagnosis not present

## 2022-10-24 DIAGNOSIS — N390 Urinary tract infection, site not specified: Secondary | ICD-10-CM | POA: Diagnosis not present

## 2022-10-29 ENCOUNTER — Encounter: Payer: Self-pay | Admitting: *Deleted

## 2022-10-31 NOTE — Progress Notes (Signed)
Patient for IR Bil Drain Exc on Fri 11/01/2022, I called and spoke with the patient's daughter, Clydie Braun on the phone and gave pre-procedure instructions. Clydie Braun was made aware to have the patient here at 11a. Clydie Braun stated understanding.  Called 10/31/2022

## 2022-11-01 ENCOUNTER — Other Ambulatory Visit (HOSPITAL_COMMUNITY): Payer: Medicare Other

## 2022-11-01 ENCOUNTER — Ambulatory Visit
Admission: RE | Admit: 2022-11-01 | Discharge: 2022-11-01 | Disposition: A | Discharge status: Home / Self Care | Payer: Medicare Other | Source: Ambulatory Visit | Attending: Diagnostic Radiology | Admitting: Diagnostic Radiology

## 2022-11-01 DIAGNOSIS — Z434 Encounter for attention to other artificial openings of digestive tract: Secondary | ICD-10-CM | POA: Diagnosis not present

## 2022-11-01 DIAGNOSIS — K819 Cholecystitis, unspecified: Secondary | ICD-10-CM | POA: Insufficient documentation

## 2022-11-01 HISTORY — PX: IR EXCHANGE BILIARY DRAIN: IMG6046

## 2022-11-01 MED ORDER — LIDOCAINE HCL 1 % IJ SOLN
INTRAMUSCULAR | Status: AC
  Filled 2022-11-01: qty 20

## 2022-11-01 MED ORDER — IOHEXOL 300 MG/ML  SOLN
50.0000 mL | Freq: Once | INTRAMUSCULAR | Status: AC | PRN
  Administered 2022-11-01: 5 mL

## 2022-11-01 MED ORDER — LIDOCAINE HCL 1 % IJ SOLN
10.0000 mL | Freq: Once | INTRAMUSCULAR | Status: AC
  Administered 2022-11-01: 2 mL via INTRADERMAL

## 2022-11-08 ENCOUNTER — Other Ambulatory Visit: Payer: Medicare Other | Admitting: Radiology

## 2022-11-28 ENCOUNTER — Other Ambulatory Visit (HOSPITAL_COMMUNITY): Payer: Self-pay

## 2022-12-13 ENCOUNTER — Other Ambulatory Visit: Payer: Self-pay | Admitting: Interventional Radiology

## 2022-12-13 DIAGNOSIS — K819 Cholecystitis, unspecified: Secondary | ICD-10-CM

## 2022-12-23 ENCOUNTER — Other Ambulatory Visit: Payer: Self-pay | Admitting: Cardiovascular Disease

## 2022-12-30 NOTE — Progress Notes (Signed)
Patient for IR Biliary Drain Exchange on Tuesday 12/31/2022, I called and spoke with the patient's daughter, Clydie Braun on the phone and gave pre-procedure instructions. Clydie Braun was made aware to have the patient here at 11a. Clydie Braun stated understanding.  Called 12/13/2022

## 2022-12-31 ENCOUNTER — Ambulatory Visit
Admission: RE | Admit: 2022-12-31 | Discharge: 2022-12-31 | Disposition: A | Discharge status: Home / Self Care | Payer: Medicare Other | Source: Ambulatory Visit | Attending: Interventional Radiology | Admitting: Interventional Radiology

## 2022-12-31 DIAGNOSIS — K819 Cholecystitis, unspecified: Secondary | ICD-10-CM | POA: Insufficient documentation

## 2022-12-31 DIAGNOSIS — Z434 Encounter for attention to other artificial openings of digestive tract: Secondary | ICD-10-CM | POA: Insufficient documentation

## 2022-12-31 HISTORY — PX: IR EXCHANGE BILIARY DRAIN: IMG6046

## 2022-12-31 MED ORDER — LIDOCAINE HCL 1 % IJ SOLN
5.0000 mL | Freq: Once | INTRAMUSCULAR | Status: AC
  Administered 2022-12-31: 5 mL via INTRADERMAL

## 2022-12-31 MED ORDER — IOHEXOL 300 MG/ML  SOLN
8.0000 mL | Freq: Once | INTRAMUSCULAR | Status: AC | PRN
  Administered 2022-12-31: 8 mL

## 2022-12-31 MED ORDER — LIDOCAINE HCL 1 % IJ SOLN
INTRAMUSCULAR | Status: AC
  Filled 2022-12-31: qty 20

## 2022-12-31 NOTE — Procedures (Signed)
Interventional Radiology Procedure Note  Procedure: Image guided drain exchange, perc chole.  New 57F  Complications: None    Recommendations: - Routine drain care,    - routine wound care  Signed,  Yvone Neu. Loreta Ave, DO

## 2023-01-03 ENCOUNTER — Ambulatory Visit: Payer: Medicare Other | Admitting: Family Medicine

## 2023-01-07 ENCOUNTER — Telehealth: Payer: Self-pay

## 2023-01-08 ENCOUNTER — Other Ambulatory Visit: Payer: Self-pay | Admitting: Interventional Radiology

## 2023-01-08 ENCOUNTER — Ambulatory Visit: Payer: Medicare Other | Admitting: Family Medicine

## 2023-01-08 DIAGNOSIS — K810 Acute cholecystitis: Secondary | ICD-10-CM

## 2023-01-08 NOTE — Progress Notes (Signed)
Patient for IR Biliary Drain check and possible Exchange on Thurs 01/09/23, I called and spoke with the patient's daughter, Kylie May on the phone and gave pre-procedure instructions. Kylie May was made aware to have the patient here at 2:45p. Kylie May stated understanding.  Called 01/08/2023

## 2023-01-09 ENCOUNTER — Ambulatory Visit
Admission: RE | Admit: 2023-01-09 | Discharge: 2023-01-09 | Disposition: A | Discharge status: Home / Self Care | Payer: Medicare Other | Source: Ambulatory Visit | Attending: Interventional Radiology | Admitting: Interventional Radiology

## 2023-01-10 ENCOUNTER — Ambulatory Visit
Admission: RE | Admit: 2023-01-10 | Discharge: 2023-01-10 | Disposition: A | Discharge status: Home / Self Care | Payer: Medicare Other | Source: Ambulatory Visit | Attending: Interventional Radiology | Admitting: Interventional Radiology

## 2023-01-10 ENCOUNTER — Other Ambulatory Visit: Payer: Self-pay | Admitting: Interventional Radiology

## 2023-01-10 DIAGNOSIS — Z434 Encounter for attention to other artificial openings of digestive tract: Secondary | ICD-10-CM | POA: Diagnosis not present

## 2023-01-10 DIAGNOSIS — K810 Acute cholecystitis: Secondary | ICD-10-CM

## 2023-01-10 HISTORY — PX: IR RADIOLOGIST EVAL & MGMT: IMG5224

## 2023-01-14 ENCOUNTER — Telehealth: Payer: Self-pay | Admitting: Family Medicine

## 2023-01-14 ENCOUNTER — Telehealth: Payer: Self-pay | Admitting: Cardiovascular Disease

## 2023-01-14 NOTE — Telephone Encounter (Signed)
Clindamycin does not interact with Eliquis or any of her other cardiac medications, ok for her to take. Can take the medication with water.

## 2023-01-14 NOTE — Telephone Encounter (Signed)
Patients daughter advised of providers message and verbalized understanding. Steward Drone notified that clearance for dental extraction received and our office will update her once its complete.

## 2023-01-14 NOTE — Telephone Encounter (Signed)
Pt daughter called back asking to speak to a nurse to explain previous message. Note below was read to her. She stated that pt cardiologist also would like to get involved. She would like a call ack [phone]

## 2023-01-14 NOTE — Telephone Encounter (Signed)
Patient's daughter Steward Drone, [phone]. Patient has a lose tooth and it is infected. The dentist wanted daughter to call to see if she should go off her  apixaban (ELIQUIS) 2.5 MG TABS tablet for the procedure next week.

## 2023-01-14 NOTE — Telephone Encounter (Signed)
I need to know when the procedure is.  It would help if the dentist sent a formal request on this so we can send back to the dentist.  Thanks.

## 2023-01-14 NOTE — Telephone Encounter (Signed)
    Primary Cardiologist: Julien Nordmann, MD  Chart reviewed as part of pre-operative protocol coverage. Simple dental extractions are considered low risk procedures per guidelines and generally do not require any specific cardiac clearance. It is also generally accepted that for simple extractions and dental cleanings, there is no need to interrupt blood thinner therapy.   SBE prophylaxis is not required for the patient.  I will route this recommendation to the requesting party via Epic fax function and remove from pre-op pool.  Please call with questions.  Jonita Albee, PA-C 01/14/2023, 4:40 PM

## 2023-01-14 NOTE — Telephone Encounter (Signed)
   Pre-operative Risk Assessment    Patient Name: Kylie May  DOB: 1921-11-27 MRN: [id number]     Request for Surgical Clearance    Procedure:  Dental Extraction - Amount of Teeth to be Pulled:  1 tooth   Date of Surgery:  Clearance TBD                                 Surgeon:  Dr. Karilyn Cota  Surgeon's Group or Practice Name:  Dr. Patty Sermons office  Phone number:  [phone] Fax number:  [phone]   Type of Clearance Requested:   - Pharmacy:  Hold Apixaban (Eliquis) Needs to know    Type of Anesthesia:  Local    Additional requests/questions:  Please advise surgeon/provider what medications should be held.  Signed, April Henson   01/14/2023, 4:19 PM

## 2023-01-14 NOTE — Telephone Encounter (Signed)
Pt c/o medication issue:  1. Name of Medication: Clindamycin, 150 mg  2. How are you currently taking this medication (dosage and times per day)? Every 6 hours  3. Are you having a reaction (difficulty breathing--STAT)?   4. What is your medication issue?   Daughter stated patient has been prescribed this medication for 7 days and wants to know if this will affect her taking Eliquis and what fluid she should take this medication with.

## 2023-01-16 NOTE — Telephone Encounter (Signed)
Noted  

## 2023-01-21 ENCOUNTER — Other Ambulatory Visit (HOSPITAL_COMMUNITY): Payer: Self-pay

## 2023-01-21 NOTE — Telephone Encounter (Signed)
Error

## 2023-01-22 ENCOUNTER — Other Ambulatory Visit (HOSPITAL_COMMUNITY): Payer: Self-pay

## 2023-01-30 ENCOUNTER — Encounter: Payer: Self-pay | Admitting: Family Medicine

## 2023-01-30 ENCOUNTER — Ambulatory Visit (INDEPENDENT_AMBULATORY_CARE_PROVIDER_SITE_OTHER): Payer: Medicare Other

## 2023-01-30 ENCOUNTER — Ambulatory Visit (INDEPENDENT_AMBULATORY_CARE_PROVIDER_SITE_OTHER): Payer: Medicare Other | Admitting: Family Medicine

## 2023-01-30 VITALS — BP 112/62 | HR 64 | Temp 98.0°F | Ht 59.0 in | Wt 112.2 lb

## 2023-01-30 DIAGNOSIS — K047 Periapical abscess without sinus: Secondary | ICD-10-CM | POA: Diagnosis not present

## 2023-01-30 DIAGNOSIS — R053 Chronic cough: Secondary | ICD-10-CM

## 2023-01-30 DIAGNOSIS — E039 Hypothyroidism, unspecified: Secondary | ICD-10-CM

## 2023-01-30 DIAGNOSIS — R911 Solitary pulmonary nodule: Secondary | ICD-10-CM | POA: Diagnosis not present

## 2023-01-30 DIAGNOSIS — J309 Allergic rhinitis, unspecified: Secondary | ICD-10-CM

## 2023-01-30 DIAGNOSIS — R918 Other nonspecific abnormal finding of lung field: Secondary | ICD-10-CM | POA: Diagnosis not present

## 2023-01-30 MED ORDER — NORMAL SALINE FLUSH 0.9 % IV SOLN
INTRAVENOUS | 3 refills | Status: DC
  Filled 2023-02-07: qty 600, 60d supply, fill #0
  Filled 2023-05-31 – 2023-06-02 (×2): qty 600, 60d supply, fill #1

## 2023-01-30 MED ORDER — MONTELUKAST SODIUM 10 MG PO TABS
10.0000 mg | ORAL_TABLET | Freq: Every day | ORAL | 3 refills | Status: DC
Start: 2023-01-30 — End: 2023-05-27

## 2023-01-31 ENCOUNTER — Other Ambulatory Visit: Payer: Self-pay | Admitting: Family Medicine

## 2023-01-31 DIAGNOSIS — K047 Periapical abscess without sinus: Secondary | ICD-10-CM | POA: Insufficient documentation

## 2023-01-31 DIAGNOSIS — E039 Hypothyroidism, unspecified: Secondary | ICD-10-CM

## 2023-01-31 DIAGNOSIS — R053 Chronic cough: Secondary | ICD-10-CM | POA: Insufficient documentation

## 2023-01-31 LAB — TSH: TSH: 6.75 u[IU]/mL — ABNORMAL HIGH (ref 0.35–5.50)

## 2023-01-31 NOTE — Progress Notes (Signed)
Marikay Alar, MD Phone: [phone]  Kylie May is a 89 y.o. female who presents today for follow-up.  Hypothyroidism: Taking Synthroid.  Due for TSH check.  Chronic cough: Continues to have cough.  She spits up mucus with this.  Does not specifically occur with eating.  She is on allergy medication and reflux medication with little benefit to her cough.  Tooth abscess: Patient has been on clindamycin for several weeks.  She has had some vomiting with this and has had some increased urination though they have increased her water intake with this.  They are requesting a larger volume of saline flushes as they are running out quicker than 90 days.  Social History   Tobacco Use  Smoking Status Former   Current packs/day: 0.00   Types: Cigarettes   Quit date: 06/04/1951   Years since quitting: 71.7   Passive exposure: Past  Smokeless Tobacco Never  Tobacco Comments   Smoked 1945-1953 , up to 2 cigarettes/day    Current Outpatient Medications on File Prior to Visit  Medication Sig Dispense Refill   acetaminophen (TYLENOL) 325 MG tablet Take 2 tablets (650 mg total) by mouth every 6 (six) hours as needed for mild pain (or Fever >/= 101).     amiodarone (PACERONE) 200 MG tablet Take 1 tablet (200 mg total) by mouth daily. 90 tablet 3   apixaban (ELIQUIS) 2.5 MG TABS tablet Take 1 tablet (2.5 mg total) by mouth 2 (two) times daily. 180 tablet 3   azelastine (ASTELIN) 0.1 % nasal spray Place 2 sprays into both nostrils 2 (two) times daily. Use in each nostril as directed 30 mL 12   ferrous sulfate 325 (65 FE) MG EC tablet Take 325 mg by mouth daily.     fluticasone (FLONASE) 50 MCG/ACT nasal spray Place 2 sprays into both nostrils daily. 16 g 11   hydrocortisone (ANUSOL-HC) 25 MG suppository Place 1 suppository (25 mg total) rectally 2 (two) times daily as needed for hemorrhoids or anal itching. 14 suppository 1   levothyroxine (SYNTHROID) 50 MCG tablet Take 1 tablet (50 mcg  total) by mouth daily. 90 tablet 3   magnesium chloride (SLOW-MAG) 64 MG TBEC SR tablet Take 2 tablets (128 mg total) by mouth daily. 60 tablet 3   metoprolol tartrate (LOPRESSOR) 25 MG tablet TAKE 1 TABLET TWICE A DAY 180 tablet 3   nystatin cream (MYCOSTATIN) nystatin 100,000 unit/gram topical cream  APPLY TOPICALLY TO THE AFFECTED AREA TWICE DAILY     omeprazole (PRILOSEC) 40 MG capsule Take 1 capsule (40 mg total) by mouth in the morning and at bedtime. TAKE 1 CAPSULE DAILY (PLEASE SCHEDULE OFFICE VISIT FOR FURTHER REFILLS) 180 capsule 1   Probiotic Product (PROBIOTIC PO) Take by mouth daily at 2 am.     No current facility-administered medications on file prior to visit.     ROS see history of present illness  Objective  Physical Exam Vitals:   01/30/23 1527  BP: 112/62  Pulse: 64  Temp: 98 F (36.7 C)  SpO2: 98%    BP Readings from Last 3 Encounters:  01/30/23 112/62  10/02/22 128/82  09/04/22 112/66   Wt Readings from Last 3 Encounters:  01/30/23 112 lb 3.2 oz (50.9 kg)  10/02/22 112 lb (50.8 kg)  09/04/22 111 lb (50.3 kg)    Physical Exam Constitutional:      General: She is not in acute distress.    Appearance: She is not diaphoretic.  Cardiovascular:  Rate and Rhythm: Normal rate and regular rhythm.     Heart sounds: Normal heart sounds.  Pulmonary:     Effort: Pulmonary effort is normal.     Breath sounds: Normal breath sounds.  Skin:    General: Skin is warm and dry.  Neurological:     Mental Status: She is alert.      Assessment/Plan: Please see individual problem list.  Chronic cough Assessment & Plan: Possibly related to allergies versus reflux.  Given chronicity we will get a chest x-ray today.  Orders: -     DG Chest 2 View; Future  Hypothyroidism, unspecified type Assessment & Plan: Chronic issue.  Check TSH.  Continue Synthroid 50 mcg daily.  Orders: -     TSH  Allergic rhinitis, unspecified seasonality, unspecified  trigger Assessment & Plan: Chronic issue.  Cough could be related to allergies.  She will continue Singulair 10 mg daily and Flonase 2 sprays each nostril twice daily.  Orders: -     Montelukast Sodium; Take 1 tablet (10 mg total) by mouth at bedtime.  Dispense: 30 tablet; Refill: 3  Tooth abscess Assessment & Plan: Patient will continue treatment through her dentist.   Other orders -     Normal Saline Flush; FLUSH ONCE DAILY AS DIRECTED  Dispense: 3215 mL; Refill: 3     Return in about 3 months (around 05/02/2023).   Marikay Alar, MD [facility]

## 2023-01-31 NOTE — Assessment & Plan Note (Signed)
Patient will continue treatment through her dentist.

## 2023-01-31 NOTE — Assessment & Plan Note (Signed)
Chronic issue.  Check TSH.  Continue Synthroid 50 mcg daily. 

## 2023-01-31 NOTE — Assessment & Plan Note (Signed)
Chronic issue.  Cough could be related to allergies.  She will continue Singulair 10 mg daily and Flonase 2 sprays each nostril twice daily.

## 2023-01-31 NOTE — Assessment & Plan Note (Signed)
Possibly related to allergies versus reflux.  Given chronicity we will get a chest x-ray today.

## 2023-02-07 ENCOUNTER — Telehealth: Payer: Self-pay | Admitting: Family Medicine

## 2023-02-07 ENCOUNTER — Other Ambulatory Visit (HOSPITAL_COMMUNITY): Payer: Self-pay

## 2023-02-07 NOTE — Telephone Encounter (Signed)
Pt daughter called about chest xray results

## 2023-02-07 NOTE — Telephone Encounter (Signed)
Spoke to pt's daughter notified results were not back, informed pt's daughter to give it another week. Also informed pt's daughter we would give her call back once we receive the results

## 2023-02-08 ENCOUNTER — Other Ambulatory Visit (HOSPITAL_COMMUNITY): Payer: Self-pay

## 2023-02-10 ENCOUNTER — Other Ambulatory Visit: Payer: Self-pay | Admitting: Interventional Radiology

## 2023-02-10 ENCOUNTER — Other Ambulatory Visit: Payer: Self-pay | Admitting: Family Medicine

## 2023-02-10 DIAGNOSIS — R911 Solitary pulmonary nodule: Secondary | ICD-10-CM

## 2023-02-10 DIAGNOSIS — K810 Acute cholecystitis: Secondary | ICD-10-CM

## 2023-02-18 ENCOUNTER — Other Ambulatory Visit: Payer: Medicare Other

## 2023-02-20 ENCOUNTER — Ambulatory Visit
Admission: RE | Admit: 2023-02-20 | Discharge: 2023-02-20 | Disposition: A | Discharge status: Home / Self Care | Payer: Medicare Other | Source: Ambulatory Visit | Attending: Family Medicine | Admitting: Family Medicine

## 2023-02-20 DIAGNOSIS — R911 Solitary pulmonary nodule: Secondary | ICD-10-CM | POA: Insufficient documentation

## 2023-02-20 DIAGNOSIS — J9811 Atelectasis: Secondary | ICD-10-CM | POA: Diagnosis not present

## 2023-02-20 DIAGNOSIS — J90 Pleural effusion, not elsewhere classified: Secondary | ICD-10-CM | POA: Diagnosis not present

## 2023-02-24 NOTE — Progress Notes (Signed)
Patient for IR Biliary Drain Exchange on Tuesday 02/25/23, I called and spoke with the patient's daughter, Clydie Braun on the phone and gave pre-procedure instructions. Clydie Braun was made aware to have the patient here at 11a. Clydie Braun stated understanding.  Called 02/10/23

## 2023-02-25 ENCOUNTER — Other Ambulatory Visit: Payer: Self-pay | Admitting: Interventional Radiology

## 2023-02-25 ENCOUNTER — Ambulatory Visit
Admission: RE | Admit: 2023-02-25 | Discharge: 2023-02-25 | Disposition: A | Discharge status: Home / Self Care | Payer: Medicare Other | Source: Ambulatory Visit | Attending: Interventional Radiology | Admitting: Interventional Radiology

## 2023-02-25 DIAGNOSIS — K810 Acute cholecystitis: Secondary | ICD-10-CM | POA: Insufficient documentation

## 2023-02-25 DIAGNOSIS — Z434 Encounter for attention to other artificial openings of digestive tract: Secondary | ICD-10-CM | POA: Insufficient documentation

## 2023-02-25 HISTORY — PX: IR EXCHANGE BILIARY DRAIN: IMG6046

## 2023-02-25 MED ORDER — IOHEXOL 300 MG/ML  SOLN
10.0000 mL | Freq: Once | INTRAMUSCULAR | Status: AC | PRN
  Administered 2023-02-25: 4 mL

## 2023-02-25 MED ORDER — LIDOCAINE HCL 1 % IJ SOLN
INTRAMUSCULAR | Status: AC
  Filled 2023-02-25: qty 20

## 2023-02-25 NOTE — Procedures (Signed)
Interventional Radiology Procedure Note  Procedure: FLUORO 12FR CHOLECYSTOSTOMY TUBE EXCHG    Complications: None  Estimated Blood Loss:  0  Findings: 12 FR TUBE    M. Ruel Favors, MD

## 2023-02-28 ENCOUNTER — Telehealth: Payer: Self-pay

## 2023-02-28 ENCOUNTER — Other Ambulatory Visit: Payer: Medicare Other

## 2023-02-28 NOTE — Telephone Encounter (Signed)
Patient's daughter, Glyn Ade, called to reschedule patient's lab visit.  Steward Drone states she would like to know if we have received the results of patient's CT scan.  Steward Drone states she would like to know if the results can be given to them over the phone or will they need to schedule a visit with Dr. Marikay Alar to discuss.  Steward Drone states if they are coming in to see Dr. Birdie Sons, she thinks it may be a good idea to have patient's lab visit at the same time.

## 2023-02-28 NOTE — Telephone Encounter (Signed)
Kylie May also would like for [place] to please call her back today.  Steward Drone states she will wait until she hears from [place] before she reschedules patient's lab visit.

## 2023-02-28 NOTE — Telephone Encounter (Signed)
Patient's daughter, Glyn Ade, called to follow-up on her previous call.  I spoke with Prince Solian, CMA.  Lanora Manis states they will call them with the results when they become available, so there is no need to schedule an appointment with Dr. Marikay Alar.  I relayed message to [place].  I will go ahead and schedule the lab visit for patient.

## 2023-03-03 ENCOUNTER — Other Ambulatory Visit: Payer: Medicare Other

## 2023-03-04 ENCOUNTER — Other Ambulatory Visit (INDEPENDENT_AMBULATORY_CARE_PROVIDER_SITE_OTHER): Payer: Medicare Other

## 2023-03-04 DIAGNOSIS — E039 Hypothyroidism, unspecified: Secondary | ICD-10-CM | POA: Diagnosis not present

## 2023-03-05 LAB — TSH: TSH: 4.23 u[IU]/mL (ref 0.35–5.50)

## 2023-03-06 NOTE — Telephone Encounter (Signed)
Called Patient's daughter to let her know about the Radiologists shortage and that we have not received the results but when we receive them we will call her.

## 2023-03-06 NOTE — Telephone Encounter (Signed)
Spoke to daughter and let her know we have not received the results.

## 2023-03-06 NOTE — Telephone Encounter (Signed)
Patient's daughter just called and wants to know the update about the CT scan. She said it has been over two weeks since her mom has it done, and they haven't heard anything about it yet. Could someone call her. Her number is [phone].

## 2023-03-07 ENCOUNTER — Other Ambulatory Visit: Payer: Self-pay | Admitting: Family Medicine

## 2023-03-07 DIAGNOSIS — K219 Gastro-esophageal reflux disease without esophagitis: Secondary | ICD-10-CM

## 2023-03-10 ENCOUNTER — Emergency Department: Payer: Medicare Other

## 2023-03-10 ENCOUNTER — Telehealth: Payer: Self-pay | Admitting: Family Medicine

## 2023-03-10 ENCOUNTER — Inpatient Hospital Stay
Admission: EM | Admit: 2023-03-10 | Discharge: 2023-03-12 | DRG: 871 | Disposition: A | Discharge status: Home / Self Care | Payer: Medicare Other | Attending: Obstetrics and Gynecology | Admitting: Obstetrics and Gynecology

## 2023-03-10 ENCOUNTER — Other Ambulatory Visit: Payer: Self-pay

## 2023-03-10 DIAGNOSIS — R079 Chest pain, unspecified: Secondary | ICD-10-CM | POA: Diagnosis not present

## 2023-03-10 DIAGNOSIS — A414 Sepsis due to anaerobes: Principal | ICD-10-CM | POA: Diagnosis present

## 2023-03-10 DIAGNOSIS — Z79899 Other long term (current) drug therapy: Secondary | ICD-10-CM | POA: Diagnosis not present

## 2023-03-10 DIAGNOSIS — H9190 Unspecified hearing loss, unspecified ear: Secondary | ICD-10-CM | POA: Diagnosis present

## 2023-03-10 DIAGNOSIS — K5730 Diverticulosis of large intestine without perforation or abscess without bleeding: Secondary | ICD-10-CM | POA: Diagnosis not present

## 2023-03-10 DIAGNOSIS — Z87891 Personal history of nicotine dependence: Secondary | ICD-10-CM

## 2023-03-10 DIAGNOSIS — Z880 Allergy status to penicillin: Secondary | ICD-10-CM

## 2023-03-10 DIAGNOSIS — E876 Hypokalemia: Secondary | ICD-10-CM | POA: Diagnosis not present

## 2023-03-10 DIAGNOSIS — K219 Gastro-esophageal reflux disease without esophagitis: Secondary | ICD-10-CM | POA: Diagnosis present

## 2023-03-10 DIAGNOSIS — Z1152 Encounter for screening for COVID-19: Secondary | ICD-10-CM

## 2023-03-10 DIAGNOSIS — E7800 Pure hypercholesterolemia, unspecified: Secondary | ICD-10-CM | POA: Diagnosis present

## 2023-03-10 DIAGNOSIS — A419 Sepsis, unspecified organism: Secondary | ICD-10-CM | POA: Diagnosis not present

## 2023-03-10 DIAGNOSIS — N1831 Chronic kidney disease, stage 3a: Secondary | ICD-10-CM | POA: Diagnosis present

## 2023-03-10 DIAGNOSIS — E039 Hypothyroidism, unspecified: Secondary | ICD-10-CM | POA: Diagnosis present

## 2023-03-10 DIAGNOSIS — I480 Paroxysmal atrial fibrillation: Secondary | ICD-10-CM | POA: Diagnosis not present

## 2023-03-10 DIAGNOSIS — Z79890 Hormone replacement therapy (postmenopausal): Secondary | ICD-10-CM

## 2023-03-10 DIAGNOSIS — Z85828 Personal history of other malignant neoplasm of skin: Secondary | ICD-10-CM

## 2023-03-10 DIAGNOSIS — K449 Diaphragmatic hernia without obstruction or gangrene: Secondary | ICD-10-CM | POA: Diagnosis not present

## 2023-03-10 DIAGNOSIS — K563 Gallstone ileus: Secondary | ICD-10-CM | POA: Diagnosis not present

## 2023-03-10 DIAGNOSIS — Z800 Family history of malignant neoplasm of digestive organs: Secondary | ICD-10-CM

## 2023-03-10 DIAGNOSIS — R509 Fever, unspecified: Secondary | ICD-10-CM | POA: Diagnosis not present

## 2023-03-10 DIAGNOSIS — Z888 Allergy status to other drugs, medicaments and biological substances status: Secondary | ICD-10-CM | POA: Diagnosis not present

## 2023-03-10 DIAGNOSIS — J189 Pneumonia, unspecified organism: Secondary | ICD-10-CM | POA: Diagnosis present

## 2023-03-10 DIAGNOSIS — J9601 Acute respiratory failure with hypoxia: Secondary | ICD-10-CM

## 2023-03-10 DIAGNOSIS — Z803 Family history of malignant neoplasm of breast: Secondary | ICD-10-CM

## 2023-03-10 DIAGNOSIS — K8020 Calculus of gallbladder without cholecystitis without obstruction: Secondary | ICD-10-CM | POA: Diagnosis not present

## 2023-03-10 DIAGNOSIS — I051 Rheumatic mitral insufficiency: Secondary | ICD-10-CM | POA: Diagnosis not present

## 2023-03-10 DIAGNOSIS — A0472 Enterocolitis due to Clostridium difficile, not specified as recurrent: Secondary | ICD-10-CM | POA: Diagnosis present

## 2023-03-10 DIAGNOSIS — M810 Age-related osteoporosis without current pathological fracture: Secondary | ICD-10-CM | POA: Diagnosis not present

## 2023-03-10 DIAGNOSIS — K819 Cholecystitis, unspecified: Secondary | ICD-10-CM | POA: Diagnosis not present

## 2023-03-10 DIAGNOSIS — R109 Unspecified abdominal pain: Secondary | ICD-10-CM | POA: Diagnosis not present

## 2023-03-10 DIAGNOSIS — I959 Hypotension, unspecified: Secondary | ICD-10-CM | POA: Diagnosis present

## 2023-03-10 DIAGNOSIS — Z8249 Family history of ischemic heart disease and other diseases of the circulatory system: Secondary | ICD-10-CM | POA: Diagnosis not present

## 2023-03-10 DIAGNOSIS — Z7901 Long term (current) use of anticoagulants: Secondary | ICD-10-CM | POA: Diagnosis not present

## 2023-03-10 DIAGNOSIS — I5032 Chronic diastolic (congestive) heart failure: Secondary | ICD-10-CM | POA: Diagnosis present

## 2023-03-10 DIAGNOSIS — K529 Noninfective gastroenteritis and colitis, unspecified: Secondary | ICD-10-CM | POA: Diagnosis not present

## 2023-03-10 DIAGNOSIS — E871 Hypo-osmolality and hyponatremia: Secondary | ICD-10-CM | POA: Diagnosis not present

## 2023-03-10 DIAGNOSIS — K5989 Other specified functional intestinal disorders: Secondary | ICD-10-CM | POA: Diagnosis not present

## 2023-03-10 DIAGNOSIS — R6520 Severe sepsis without septic shock: Secondary | ICD-10-CM | POA: Diagnosis not present

## 2023-03-10 DIAGNOSIS — R1084 Generalized abdominal pain: Secondary | ICD-10-CM | POA: Diagnosis not present

## 2023-03-10 DIAGNOSIS — R918 Other nonspecific abnormal finding of lung field: Secondary | ICD-10-CM | POA: Diagnosis not present

## 2023-03-10 DIAGNOSIS — R1111 Vomiting without nausea: Secondary | ICD-10-CM | POA: Diagnosis not present

## 2023-03-10 DIAGNOSIS — R9431 Abnormal electrocardiogram [ECG] [EKG]: Secondary | ICD-10-CM | POA: Diagnosis not present

## 2023-03-10 DIAGNOSIS — T85520A Displacement of bile duct prosthesis, initial encounter: Secondary | ICD-10-CM | POA: Diagnosis not present

## 2023-03-10 DIAGNOSIS — J90 Pleural effusion, not elsewhere classified: Secondary | ICD-10-CM | POA: Diagnosis not present

## 2023-03-10 LAB — LACTIC ACID, PLASMA: Lactic Acid, Venous: 1.4 mmol/L (ref 0.5–1.9)

## 2023-03-10 LAB — CBC
HCT: 35.6 % — ABNORMAL LOW (ref 36.0–46.0)
Hemoglobin: 12.5 g/dL (ref 12.0–15.0)
MCH: 31.6 pg (ref 26.0–34.0)
MCHC: 35.1 g/dL (ref 30.0–36.0)
MCV: 89.9 fL (ref 80.0–100.0)
Platelets: 274 10*3/uL (ref 150–400)
RBC: 3.96 MIL/uL (ref 3.87–5.11)
RDW: 12.2 % (ref 11.5–15.5)
WBC: 19.7 10*3/uL — ABNORMAL HIGH (ref 4.0–10.5)
nRBC: 0 % (ref 0.0–0.2)

## 2023-03-10 LAB — LIPASE, BLOOD: Lipase: 20 U/L (ref 11–51)

## 2023-03-10 LAB — COMPREHENSIVE METABOLIC PANEL
ALT: 25 U/L (ref 0–44)
AST: 31 U/L (ref 15–41)
Albumin: 2.7 g/dL — ABNORMAL LOW (ref 3.5–5.0)
Alkaline Phosphatase: 49 U/L (ref 38–126)
Anion gap: 10 (ref 5–15)
BUN: 17 mg/dL (ref 8–23)
CO2: 20 mmol/L — ABNORMAL LOW (ref 22–32)
Calcium: 8.6 mg/dL — ABNORMAL LOW (ref 8.9–10.3)
Chloride: 100 mmol/L (ref 98–111)
Creatinine, Ser: 0.98 mg/dL (ref 0.44–1.00)
GFR, Estimated: 52 mL/min — ABNORMAL LOW (ref >60)
Glucose, Bld: 140 mg/dL — ABNORMAL HIGH (ref 70–99)
Potassium: 3.6 mmol/L (ref 3.5–5.1)
Sodium: 130 mmol/L — ABNORMAL LOW (ref 135–145)
Total Bilirubin: 0.6 mg/dL (ref 0.3–1.2)
Total Protein: 6.3 g/dL — ABNORMAL LOW (ref 6.5–8.1)

## 2023-03-10 LAB — GASTROINTESTINAL PANEL BY PCR, STOOL (REPLACES STOOL CULTURE)

## 2023-03-10 LAB — C DIFFICILE QUICK SCREEN W PCR REFLEX
C Diff antigen: POSITIVE — AB
C Diff interpretation: DETECTED
C Diff toxin: POSITIVE — AB

## 2023-03-10 LAB — PROCALCITONIN: Procalcitonin: 0.15 ng/mL

## 2023-03-10 LAB — BRAIN NATRIURETIC PEPTIDE: B Natriuretic Peptide: 327.6 pg/mL — ABNORMAL HIGH (ref 0.0–100.0)

## 2023-03-10 MED ORDER — ENSURE ENLIVE PO LIQD: 237.0000 mL | Freq: Two times a day (BID) | ORAL | Status: DC

## 2023-03-10 MED ORDER — IOHEXOL 300 MG/ML  SOLN
75.0000 mL | Freq: Once | INTRAMUSCULAR | Status: AC | PRN
  Administered 2023-03-10: 75 mL via INTRAVENOUS

## 2023-03-10 MED ORDER — SODIUM CHLORIDE 0.9 % IV BOLUS
1000.0000 mL | Freq: Once | INTRAVENOUS | Status: AC
  Administered 2023-03-10: 1000 mL via INTRAVENOUS

## 2023-03-10 MED ORDER — APIXABAN 2.5 MG PO TABS
2.5000 mg | ORAL_TABLET | Freq: Two times a day (BID) | ORAL | Status: DC
  Administered 2023-03-11 – 2023-03-12 (×4): 2.5 mg via ORAL
  Filled 2023-03-10 (×5): qty 1

## 2023-03-10 MED ORDER — AMIODARONE HCL 200 MG PO TABS
200.0000 mg | ORAL_TABLET | Freq: Every day | ORAL | Status: DC
  Administered 2023-03-11 – 2023-03-12 (×2): 200 mg via ORAL
  Filled 2023-03-10 (×2): qty 1

## 2023-03-10 MED ORDER — FAMOTIDINE 20 MG PO TABS
20.0000 mg | ORAL_TABLET | Freq: Every day | ORAL | Status: DC
  Administered 2023-03-11 – 2023-03-12 (×2): 20 mg via ORAL
  Filled 2023-03-10 (×2): qty 1

## 2023-03-10 MED ORDER — VANCOMYCIN 50 MG/ML ORAL SOLUTION
125.0000 mg | Freq: Four times a day (QID) | ORAL | Status: DC
  Administered 2023-03-11 (×4): 125 mg
  Filled 2023-03-10 (×7): qty 2.5

## 2023-03-10 MED ORDER — DM-GUAIFENESIN ER 30-600 MG PO TB12: 1.0000 | ORAL_TABLET | Freq: Two times a day (BID) | ORAL | Status: DC | PRN

## 2023-03-10 MED ORDER — SODIUM CHLORIDE 0.9 % IV BOLUS
500.0000 mL | Freq: Once | INTRAVENOUS | Status: AC
  Administered 2023-03-10: 500 mL via INTRAVENOUS

## 2023-03-10 MED ORDER — FERROUS SULFATE 325 (65 FE) MG PO TABS
325.0000 mg | ORAL_TABLET | Freq: Every day | ORAL | Status: DC
  Administered 2023-03-11 – 2023-03-12 (×2): 325 mg via ORAL
  Filled 2023-03-10 (×2): qty 1

## 2023-03-10 MED ORDER — MONTELUKAST SODIUM 10 MG PO TABS
10.0000 mg | ORAL_TABLET | Freq: Every day | ORAL | Status: DC
  Administered 2023-03-11 (×2): 10 mg via ORAL
  Filled 2023-03-10 (×2): qty 1

## 2023-03-10 MED ORDER — ONDANSETRON HCL 4 MG/2ML IJ SOLN
4.0000 mg | Freq: Three times a day (TID) | INTRAMUSCULAR | Status: DC | PRN
  Administered 2023-03-12: 4 mg via INTRAVENOUS
  Filled 2023-03-10: qty 2

## 2023-03-10 MED ORDER — PIPERACILLIN-TAZOBACTAM 3.375 G IVPB 30 MIN
3.3750 g | Freq: Once | INTRAVENOUS | Status: AC
  Administered 2023-03-10: 3.375 g via INTRAVENOUS
  Filled 2023-03-10: qty 50

## 2023-03-10 MED ORDER — ACETAMINOPHEN 325 MG PO TABS
650.0000 mg | ORAL_TABLET | Freq: Four times a day (QID) | ORAL | Status: DC | PRN
  Administered 2023-03-12 (×3): 650 mg via ORAL
  Filled 2023-03-10 (×4): qty 2

## 2023-03-10 MED ORDER — PANTOPRAZOLE SODIUM 40 MG PO TBEC
40.0000 mg | DELAYED_RELEASE_TABLET | Freq: Every day | ORAL | Status: DC
  Administered 2023-03-11 – 2023-03-12 (×2): 40 mg via ORAL
  Filled 2023-03-10 (×2): qty 1

## 2023-03-10 MED ORDER — LEVOTHYROXINE SODIUM 50 MCG PO TABS
50.0000 ug | ORAL_TABLET | Freq: Every day | ORAL | Status: DC
  Administered 2023-03-11 – 2023-03-12 (×2): 50 ug via ORAL
  Filled 2023-03-10 (×2): qty 1

## 2023-03-10 MED ORDER — ALBUTEROL SULFATE HFA 108 (90 BASE) MCG/ACT IN AERS: 2.0000 | INHALATION_SPRAY | RESPIRATORY_TRACT | Status: DC | PRN

## 2023-03-10 MED ORDER — ONDANSETRON HCL 4 MG/2ML IJ SOLN
4.0000 mg | Freq: Once | INTRAMUSCULAR | Status: AC
  Administered 2023-03-10: 4 mg via INTRAVENOUS
  Filled 2023-03-10: qty 2

## 2023-03-10 NOTE — ED Provider Notes (Signed)
[facility] Provider Note    Event Date/Time   First MD Initiated Contact with Patient 03/10/23 1730     (approximate)   History   Diarrhea   HPI  Kylie May is a 89 y.o. female with acute cholecystitis with known biliary drain who comes in with concerns for diarrhea.  Family is concerned that the patient has had some diarrhea and some vomiting with some low-grade temperatures.  They state that she was on antibiotics recently for a tooth infection.  They are also concerned that her biliary drain has not had much drainage coming out of it and that it could not be in place.  Patient is very hard of hearing so they have to type and she has to read it on the phone.     Physical Exam   Triage Vital Signs: ED Triage Vitals  Encounter Vitals Group     BP 03/10/23 1448 (!) 106/53     Systolic BP Percentile --      Diastolic BP Percentile --      Pulse Rate 03/10/23 1448 76     Resp 03/10/23 1448 15     Temp 03/10/23 1448 97.9 F (36.6 C)     Temp Source 03/10/23 1448 Oral     SpO2 03/10/23 1900 95 %     Weight 03/10/23 1448 112 lb (50.8 kg)     Height 03/10/23 1448 4\' 11"  (1.499 m)     Head Circumference --      Peak Flow --      Pain Score 03/10/23 1431 4     Pain Loc --      Pain Education --      Exclude from Growth Chart --     Most recent vital signs: Vitals:   03/10/23 1448 03/10/23 1900  BP: (!) 106/53 (!) 113/54  Pulse: 76 68  Resp: 15 (!) 28  Temp: 97.9 F (36.6 C)   SpO2:  95%     General: Awake, no distress.  CV:  Good peripheral perfusion.  Resp:  Normal effort.  Abd:  No distention.  Slightly tender in the lower abdomen.  No rebound or guarding Other:     ED Results / Procedures / Treatments   Labs (all labs ordered are listed, but only abnormal results are displayed) Labs Reviewed  COMPREHENSIVE METABOLIC PANEL - Abnormal; Notable for the following components:      Result Value   Sodium 130 (*)    CO2 20  (*)    Glucose, Bld 140 (*)    Calcium 8.6 (*)    Total Protein 6.3 (*)    Albumin 2.7 (*)    GFR, Estimated 52 (*)    All other components within normal limits  CBC - Abnormal; Notable for the following components:   WBC 19.7 (*)    HCT 35.6 (*)    All other components within normal limits  CULTURE, BLOOD (ROUTINE X 2)  CULTURE, BLOOD (ROUTINE X 2)  URINE CULTURE  GASTROINTESTINAL PANEL BY PCR, STOOL (REPLACES STOOL CULTURE)  C DIFFICILE QUICK SCREEN W PCR REFLEX    SARS CORONAVIRUS 2 BY RT PCR  LIPASE, BLOOD  LACTIC ACID, PLASMA  URINALYSIS, ROUTINE W REFLEX MICROSCOPIC      RADIOLOGY I have reviewed the CT personally and interpreted and the cutaneous drain appears to be out of place   PROCEDURES:  Critical Care performed: Yes, see critical care procedure note(s)  .Critical Care  Performed by: Concha Se, MD Authorized by: Concha Se, MD   Critical care provider statement:    Critical care time (minutes):  30   Critical care was necessary to treat or prevent imminent or life-threatening deterioration of the following conditions:  Sepsis   Critical care was time spent personally by me on the following activities:  Development of treatment plan with patient or surrogate, discussions with consultants, evaluation of patient's response to treatment, examination of patient, ordering and review of laboratory studies, ordering and review of radiographic studies, ordering and performing treatments and interventions, pulse oximetry, re-evaluation of patient's condition and review of old charts    MEDICATIONS ORDERED IN ED: Medications  ondansetron (ZOFRAN) injection 4 mg (4 mg Intravenous Given 03/10/23 1852)  sodium chloride 0.9 % bolus 500 mL (500 mLs Intravenous New Bag/Given 03/10/23 1854)  iohexol (OMNIPAQUE) 300 MG/ML solution 75 mL (75 mLs Intravenous Contrast Given 03/10/23 1909)     IMPRESSION / MDM / ASSESSMENT AND PLAN / ED COURSE  I reviewed the triage vital  signs and the nursing notes.   Patient's presentation is most consistent with acute presentation with potential threat to life or bodily function.   Patient comes in with abdominal pain.  Respiratory rate slightly elevated.  Labs ordered CT ordered evaluate for obstruction, biliary drain issues, perforation, diverticulitis.  Patient given some fluids, nausea medicine  I did have a discussion with family about goals of care and they would like full scope of care at this time  White count was elevated.  CMP shows slightly low sodium but LFTs are reassuring lactate normal lipase normal  Patient did have episode where she did not has satted down to the 70s while she was sleeping.  If you woke her up her sats came back up to the low 90s but patient was placed on 2 L of oxygen.  Given elevated white count patient meets sepsis criteria therefore blood cultures, lactate drawn CT scan concerning for colitis.   IR drain is out of place but she can follow this up as an inpatient given no significant new fluid collection or abscess it seems like her symptoms are more likely from the colitis.   9:00 PM discussed with radiology and they do not feel that there is any stool in the vault that would be amenable to rectal exam.   This  could be from C. difficile given patient's history of recent antibiotics so will cover with oral vancomycin given she is allergic to IV Flagyl also cover for sepsis with Zosyn.  Patient has tolerated Zosyn previously even though she has allergy noted to penicillin that was when she was a kid and was just a rash.   Patient had a liter and a half of fluids and blood pressure and heart rate have stabilized.  Patient was admitted to the hospital team      FINAL CLINICAL IMPRESSION(S) / ED DIAGNOSES   Final diagnoses:  Gastroenteritis  Sepsis, due to unspecified organism, unspecified whether acute organ dysfunction present (HCC)  Acute respiratory failure with hypoxia (HCC)      Rx / DC Orders   ED Discharge Orders     None        Note:  This document was prepared using Dragon voice recognition software and may include unintentional dictation errors.   Concha Se, MD 03/10/23 [phone]

## 2023-03-10 NOTE — Telephone Encounter (Signed)
Patient appears to be in the emergency department.

## 2023-03-10 NOTE — ED Triage Notes (Signed)
Pt comes via EMS from home with diarrhea and some vomiting. Pt has had dec oral intake. Pt states belly pain.  Temp-100 BP-112/40  Pt did drink some water and threw it up this morning.

## 2023-03-10 NOTE — H&P (Signed)
History and Physical    Kylie May ZOX:[id number] DOB: 07-16-1921 DOA: 03/10/2023  Referring MD/NP/PA:   PCP: [name], MD   Patient coming from:  The patient is coming from home.     Chief Complaint: Nausea, vomiting, diarrhea and abdominal pain  HPI: Kylie May is a 89 y.o. female with medical history significant of hyperlipidemia, diastolic CHF, hypothyroidism, depression, CKD-3A, anemia, A-fib on Eliquis, diverticulitis, pancreatitis, esophageal stricture, cholecystitis with biliary drainage in place, who presents with nausea, vomiting, diarrhea and abdominal pain.  For her daughter at the bedside, patient symptoms have been going on for more than 5 days.  Patient has had nausea, vomited few times.  Patient has multiple episodes of watery diarrhea.  Patient has abdominal pain which is located in lower abdomen, constant, mild to moderate, aching, nonradiating.  Not aggravated or alleviated by any known factors.  Patient does not have fever or chills.  Patient does not have chest pain.  Patient has dry cough and mild shortness of breath. Denies symptoms of UTI.  Of note, patient oxygen saturation is 95% on room air, which dropped to 77% on sleeping.  No respiratory distress. Patient blood pressure is intermittently low with SBP down to 70s. After giving 500 cc normal saline, blood pressure improved to 108/56.  Data reviewed independently and ED Course: pt was found to have positive for C. difficile antigen and toxin, GI pathogen panel negative.  WBC 19.7, stable renal function, lactic acid 1.4, temperature normal, heart rate 76, RR 28. Pt is admitted to PCU as inpatient.   CT abdomen/pelvis: 1. Infectious or inflammatory proctocolitis. No evidence of obstruction or perforation. 2. Interval removal of previously noted percutaneous cholecystostomy. Cholelithiasis without superimposed pericholecystic inflammatory change. Trace perihepatic fluid adjacent to the  tract is unchanged, nonspecific. No loculated perihepatic fluid collections. 3. Moderate hiatal hernia. 4. Mild cardiomegaly.   Aortic Atherosclerosis (ICD10-I70.0).   CXR:  1. Left lower lobe airspace opacity that may represent a combination of atelectasis versus infection. 2.  Developing right base airspace opacity. 3. Trace left pleural effusion. 4. At least moderate volume hiatal hernia.   EKG:  Not done in ED, will get one.     Review of Systems:   General: no fevers, chills, no body weight gain, has poor appetite, has fatigue HEENT: no blurry vision, hearing changes or sore throat Respiratory: has dyspnea, coughing,  no wheezing CV: no chest pain, no palpitations GI: has nausea, vomiting, abdominal pain, diarrhea, no constipation. Has biliary drainage in place GU: no dysuria, burning on urination, increased urinary frequency, hematuria  Ext:  no leg edema Neuro: no unilateral weakness, numbness, or tingling, no vision change or hearing loss Skin: no rash, no skin tear. MSK: No muscle spasm, no deformity, no limitation of range of movement in spin Heme: No easy bruising.  Travel history: No recent long distant travel.   Allergy:  Allergies  Allergen Reactions   Metronidazole     Other reaction(s): Dizziness and giddiness (finding), Other (qualifier value) made things feel like they were vibrating like the bed and floor,also caused mild dizziness weakness   Penicillin G Rash    Past Medical History:  Diagnosis Date   A-fib (HCC)    Acid reflux disease    Arrhythmia    Atrial fibrillation with RVR (HCC) 08/15/2018   Chronic diastolic CHF (congestive heart failure) (HCC)    Compression fracture of lumbar vertebra (HCC)    Diverticulitis 01-2008   [facility] [place]  Diverticulosis    Esophageal stricture 2006   Femoral bruit    Right   Fibrocystic breast disease    Hiatal hernia    History of esophageal stricture 06/14/2004   2006 esophageal dilation Schatzki's  ring    History of skin cancer 12/29/2008   Basal cell cancer right glabella 07/10/2010 Dr. Adolphus Birchwood, [facility] @ [place] , [place]    Hypercholesterolemia    [place] study LDL goal = < 160   Mitral regurgitation    a. 06/2014 EF 55-60%, elevated end-diastolic pressures, dilated LA at 4.3 cm, mildly dilated RA, severe mitral regurgitation, mild aortic sclerosis without stenosis, mod-severe TR   Mitral valve prolapse    Osteoporosis    Dr Marcelle Overlie   Pancreatitis 07-2008   Hospitalized    Patella fracture 05/13/2018   Schatzki's ring    Sepsis (HCC) 08/15/2018   Skin cancer    facial x 2. Dr Adolphus Birchwood    Past Surgical History:  Procedure Laterality Date   BILIARY STENT PLACEMENT  08/21/2018   Procedure: BILIARY STENT PLACEMENT;  Surgeon: Kerin Salen, MD;  Location: [facility] ENDOSCOPY;  Service: Gastroenterology;;   COLONOSCOPY  2006   Epidural steroids     x1   ERCP N/A 08/21/2018   Procedure: ENDOSCOPIC RETROGRADE CHOLANGIOPANCREATOGRAPHY (ERCP);  Surgeon: Kerin Salen, MD;  Location: [facility] ENDOSCOPY;  Service: Gastroenterology;  Laterality: N/A;   ESOPHAGEAL DILATION  2006   ESOPHAGOGASTRODUODENOSCOPY (EGD) WITH PROPOFOL N/A 11/16/2018   Procedure: ESOPHAGOGASTRODUODENOSCOPY (EGD) WITH PROPOFOL;  Surgeon: Toney Reil, MD;  Location: [facility] ENDOSCOPY;  Service: Gastroenterology;  Laterality: N/A;   G3 P4 (twins)     IR CHOLANGIOGRAM EXISTING TUBE  08/18/2018   IR CHOLANGIOGRAM EXISTING TUBE  07/16/2022   IR EXCHANGE BILIARY DRAIN  11/03/2018   IR EXCHANGE BILIARY DRAIN  12/01/2018   IR EXCHANGE BILIARY DRAIN  05/13/2019   IR EXCHANGE BILIARY DRAIN  06/14/2019   IR EXCHANGE BILIARY DRAIN  07/19/2019   IR EXCHANGE BILIARY DRAIN  08/17/2019   IR EXCHANGE BILIARY DRAIN  09/30/2019   IR EXCHANGE BILIARY DRAIN  12/08/2019   IR EXCHANGE BILIARY DRAIN  01/19/2020   IR EXCHANGE BILIARY DRAIN  03/01/2020   IR EXCHANGE BILIARY DRAIN  04/20/2020   IR EXCHANGE BILIARY DRAIN  07/06/2020   IR EXCHANGE BILIARY DRAIN   10/26/2020   IR EXCHANGE BILIARY DRAIN  01/18/2021   IR EXCHANGE BILIARY DRAIN  03/05/2021   IR EXCHANGE BILIARY DRAIN  05/25/2021   IR EXCHANGE BILIARY DRAIN  08/03/2021   IR EXCHANGE BILIARY DRAIN  09/28/2021   IR EXCHANGE BILIARY DRAIN  11/23/2021   IR EXCHANGE BILIARY DRAIN  12/27/2021   IR EXCHANGE BILIARY DRAIN  02/20/2022   IR EXCHANGE BILIARY DRAIN  04/17/2022   IR EXCHANGE BILIARY DRAIN  06/17/2022   IR EXCHANGE BILIARY DRAIN  07/12/2022   IR EXCHANGE BILIARY DRAIN  08/12/2022   IR EXCHANGE BILIARY DRAIN  09/20/2022   IR EXCHANGE BILIARY DRAIN  11/01/2022   IR EXCHANGE BILIARY DRAIN  12/31/2022   IR EXCHANGE BILIARY DRAIN  02/25/2023   IR PERC CHOLECYSTOSTOMY  08/16/2018   IR RADIOLOGIST EVAL & MGMT  11/08/2021   IR RADIOLOGIST EVAL & MGMT  01/10/2023   REMOVAL OF STONES  08/21/2018   Procedure: REMOVAL OF STONES;  Surgeon: Kerin Salen, MD;  Location: [facility] ENDOSCOPY;  Service: Gastroenterology;;   SKIN CANCER EXCISION     SPHINCTEROTOMY  08/21/2018   Procedure: Dennison Mascot;  Surgeon: Kerin Salen, MD;  Location: [facility] ENDOSCOPY;  Service: Gastroenterology;;    Social History:  reports that she quit smoking about 71 years ago. Her smoking use included cigarettes. She has been exposed to tobacco smoke. She has never used smokeless tobacco. She reports current alcohol use of about 1.0 standard drink of alcohol per week. She reports that she does not use drugs.  Family History:  Family History  Problem Relation Age of Onset   Breast cancer Maternal Aunt    Hepatitis Daughter        C   Lung disease Mother        lung tumor   Heart attack Daughter 36       S/P stents   Liver cancer Daughter    Heart attack Sister 56   Diabetes Neg Hx    Stroke Neg Hx      Prior to Admission medications   Medication Sig Start Date End Date Taking? Authorizing Provider  acetaminophen (TYLENOL) 325 MG tablet Take 2 tablets (650 mg total) by mouth every 6 (six) hours as needed for mild pain (or Fever >/= 101).  05/26/21   Alford Highland, MD  amiodarone (PACERONE) 200 MG tablet Take 1 tablet (200 mg total) by mouth daily. 08/02/22   Antonieta Iba, MD  apixaban (ELIQUIS) 2.5 MG TABS tablet Take 1 tablet (2.5 mg total) by mouth 2 (two) times daily. 08/02/22   Antonieta Iba, MD  azelastine (ASTELIN) 0.1 % nasal spray Place 2 sprays into both nostrils 2 (two) times daily. Use in each nostril as directed 09/04/22   [name], MD  ferrous sulfate 325 (65 FE) MG EC tablet Take 325 mg by mouth daily.    [provider]  fluticasone (FLONASE) 50 MCG/ACT nasal spray Place 2 sprays into both nostrils daily. 07/01/22   [name], MD  hydrocortisone (ANUSOL-HC) 25 MG suppository Place 1 suppository (25 mg total) rectally 2 (two) times daily as needed for hemorrhoids or anal itching. 07/17/21   Toney Reil, MD  levothyroxine (SYNTHROID) 50 MCG tablet Take 1 tablet (50 mcg total) by mouth daily. 04/08/22   [name], MD  magnesium chloride (SLOW-MAG) 64 MG TBEC SR tablet Take 2 tablets (128 mg total) by mouth daily. 12/18/17   [name], MD  metoprolol tartrate (LOPRESSOR) 25 MG tablet TAKE 1 TABLET TWICE A DAY 12/23/22   Antonieta Iba, MD  montelukast (SINGULAIR) 10 MG tablet Take 1 tablet (10 mg total) by mouth at bedtime. 01/30/23   [name], MD  nystatin cream (MYCOSTATIN) nystatin 100,000 unit/gram topical cream  APPLY TOPICALLY TO THE AFFECTED AREA TWICE DAILY    [provider]  omeprazole (PRILOSEC) 40 MG capsule TAKE ONE CAPSULE BY MOUTH EVERY MORNING AND 1 AT BEDTIME AS DIRECTED 03/07/23   [name], MD  Probiotic Product (PROBIOTIC PO) Take by mouth daily at 2 am.    [provider]  Sodium Chloride Flush (NORMAL SALINE FLUSH) 0.9 % SOLN FLUSH ONCE DAILY AS DIRECTED 01/30/23   [name], MD    Physical Exam: Vitals:   03/10/23 2147 03/10/23 2200 03/10/23 2210 03/10/23 2215  BP: (!) 87/39 (!) 77/36 (!) 108/54  106/63  Pulse: 61 (!) 57 64 60  Resp: (!) 22 (!) 21 18 (!) 21  Temp:      TempSrc:      SpO2: 98% 99% 100% 99%  Weight:      Height:  General: Not in acute distress HEENT:       Eyes: PERRL, EOMI, no jaundice       ENT: No discharge from the ears and nose, no pharynx injection, no tonsillar enlargement.        Neck: No JVD, no bruit, no mass felt. Heme: No neck lymph node enlargement. Cardiac: S1/S2, RRR, No murmurs, No gallops or rubs. Respiratory: No rales, wheezing, rhonchi or rubs. GI: Soft, nondistended, has tenderness in lower abdomen, no rebound pain, no organomegaly, BS present. Has biliary drainage in place GU: No hematuria Ext: No pitting leg edema bilaterally. 1+DP/PT pulse bilaterally. Musculoskeletal: No joint deformities, No joint redness or warmth, no limitation of ROM in spin. Skin: No rashes.  Neuro: Alert, oriented X3, cranial nerves II-XII grossly intact, moves all extremities normally.  Psych: Patient is not psychotic, no suicidal or hemocidal ideation.  Labs on Admission: I have personally reviewed following labs and imaging studies  CBC: Recent Labs  Lab 03/10/23 1414  WBC 19.7*  HGB 12.5  HCT 35.6*  MCV 89.9  PLT 274   Basic Metabolic Panel: Recent Labs  Lab 03/10/23 1414  NA 130*  K 3.6  CL 100  CO2 20*  GLUCOSE 140*  BUN 17  CREATININE 0.98  CALCIUM 8.6*   GFR: Estimated Creatinine Clearance: 21.3 mL/min (by C-G formula based on SCr of 0.98 mg/dL). Liver Function Tests: Recent Labs  Lab 03/10/23 1414  AST 31  ALT 25  ALKPHOS 49  BILITOT 0.6  PROT 6.3*  ALBUMIN 2.7*   Recent Labs  Lab 03/10/23 1414  LIPASE 20   No results for input(s): "AMMONIA" in the last 168 hours. Coagulation Profile: No results for input(s): "INR", "PROTIME" in the last 168 hours. Cardiac Enzymes: No results for input(s): "CKTOTAL", "CKMB", "CKMBINDEX", "TROPONINI" in the last 168 hours. BNP (last 3 results) No results for input(s): "PROBNP"  in the last 8760 hours. HbA1C: No results for input(s): "HGBA1C" in the last 72 hours. CBG: No results for input(s): "GLUCAP" in the last 168 hours. Lipid Profile: No results for input(s): "CHOL", "HDL", "LDLCALC", "TRIG", "CHOLHDL", "LDLDIRECT" in the last 72 hours. Thyroid Function Tests: No results for input(s): "TSH", "T4TOTAL", "FREET4", "T3FREE", "THYROIDAB" in the last 72 hours. Anemia Panel: No results for input(s): "VITAMINB12", "FOLATE", "FERRITIN", "TIBC", "IRON", "RETICCTPCT" in the last 72 hours. Urine analysis:    Component Value Date/Time   COLORURINE YELLOW 09/05/2022 1647   APPEARANCEUR Hazy (A) 10/18/2022 1000   LABSPEC 1.020 09/05/2022 1647   LABSPEC 1.016 09/16/2014 0859   PHURINE 6.0 09/05/2022 1647   GLUCOSEU Negative 10/18/2022 1000   GLUCOSEU NEGATIVE 09/05/2022 1647   HGBUR NEGATIVE 09/05/2022 1647   HGBUR negative 09/15/2008 1336   BILIRUBINUR Negative 10/18/2022 1000   BILIRUBINUR Negative 09/16/2014 0859   KETONESUR NEGATIVE 09/05/2022 1647   PROTEINUR Negative 10/18/2022 1000   PROTEINUR TRACE (A) 05/23/2021 1334   UROBILINOGEN 0.2 10/02/2022 1610   UROBILINOGEN 0.2 09/05/2022 1647   NITRITE Negative 10/18/2022 1000   NITRITE NEGATIVE 09/05/2022 1647   LEUKOCYTESUR Negative 10/18/2022 1000   LEUKOCYTESUR NEGATIVE 09/05/2022 1647   LEUKOCYTESUR Negative 09/16/2014 0859   Sepsis Labs: @LABRCNTIP (procalcitonin:4,lacticidven:4) ) Recent Results (from the past 240 hour(s))  Gastrointestinal Panel by PCR , Stool     Status: None   Collection Time: 03/10/23  9:46 PM   Specimen: Stool  Result Value Ref Range Status   Campylobacter species NOT DETECTED NOT DETECTED Final   Plesimonas shigelloides NOT DETECTED NOT DETECTED  Final   Salmonella species NOT DETECTED NOT DETECTED Final   Yersinia enterocolitica NOT DETECTED NOT DETECTED Final   Vibrio species NOT DETECTED NOT DETECTED Final   Vibrio cholerae NOT DETECTED NOT DETECTED Final    Enteroaggregative E coli (EAEC) NOT DETECTED NOT DETECTED Final   Enteropathogenic E coli (EPEC) NOT DETECTED NOT DETECTED Final   Enterotoxigenic E coli (ETEC) NOT DETECTED NOT DETECTED Final   Shiga like toxin producing E coli (STEC) NOT DETECTED NOT DETECTED Final   Shigella/Enteroinvasive E coli (EIEC) NOT DETECTED NOT DETECTED Final   Cryptosporidium NOT DETECTED NOT DETECTED Final   Cyclospora cayetanensis NOT DETECTED NOT DETECTED Final   Entamoeba histolytica NOT DETECTED NOT DETECTED Final   Giardia lamblia NOT DETECTED NOT DETECTED Final   Adenovirus F40/41 NOT DETECTED NOT DETECTED Final   Astrovirus NOT DETECTED NOT DETECTED Final   Norovirus GI/GII NOT DETECTED NOT DETECTED Final   Rotavirus A NOT DETECTED NOT DETECTED Final   Sapovirus (I, II, IV, and V) NOT DETECTED NOT DETECTED Final    Comment: Performed at [facility], [address] [place], [place] [address]  C Difficile Quick Screen w PCR reflex     Status: Abnormal   Collection Time: 03/10/23  9:46 PM   Specimen: STOOL  Result Value Ref Range Status   C Diff antigen POSITIVE (A) NEGATIVE Final   C Diff toxin POSITIVE (A) NEGATIVE Final   C Diff interpretation Toxin producing C. difficile detected.  Final    Comment: RESULT CALLED TO, READ BACK BY AND VERIFIED WITH: MICHAEL MILES@[email or web address]  03/10/23 RH Performed at [facility], [address], [place], [place] [address]      Radiological Exams on Admission: DG Chest Portable 1 View  Result Date: 03/10/2023 CLINICAL DATA:  Sob Pt has had dec oral intake. Pt states belly pain. Temp-100 BP-112/40 Pt did drink some water and threw it up this morning EXAM: PORTABLE CHEST 1 VIEW COMPARISON:  Chest x-ray 01/30/2023, CT chest 02/20/2023, CT abdomen pelvis 03/10/2023 FINDINGS: The heart and mediastinal contours are within normal limits. Retrocardiac gas density consistent with known at least moderate volume hiatal hernia. Developing right base airspace  opacity. Left lower lobe airspace opacity. No pulmonary edema. Possible trace left pleural effusion. No pneumothorax. No acute osseous abnormality. IMPRESSION: 1. Left lower lobe airspace opacity that may represent a combination of atelectasis versus infection. 2.  Developing right base airspace opacity. 3. Trace left pleural effusion. 4. At least moderate volume hiatal hernia. Electronically Signed   By: Tish Frederickson M.D.   On: 03/10/2023 22:05   CT ABDOMEN PELVIS W CONTRAST  Result Date: 03/10/2023 CLINICAL DATA:  Acute nonlocalized abdominal pain. EXAM: CT ABDOMEN AND PELVIS WITH CONTRAST TECHNIQUE: Multidetector CT imaging of the abdomen and pelvis was performed using the standard protocol following bolus administration of intravenous contrast. RADIATION DOSE REDUCTION: This exam was performed according to the departmental dose-optimization program which includes automated exposure control, adjustment of the mA and/or kV according to patient size and/or use of iterative reconstruction technique. CONTRAST:  75mL OMNIPAQUE IOHEXOL 300 MG/ML  SOLN COMPARISON:  05/22/2021 FINDINGS: Lower chest: No acute abnormality. Bibasilar atelectasis. Cardiac size is mildly enlarged. Moderate hiatal hernia Hepatobiliary: Previously noted percutaneous cholecystostomy has been removed. Cholelithiasis noted without superimposed pericholecystic inflammatory change. Infiltration adjacent to the gallbladder fundus represents the residual cholecystostomy tract. Trace perihepatic fluid adjacent to the tract is unchanged, nonspecific. No loculated perihepatic fluid collections. Mild pneumobilia may relate to changes of  sphincterotomy. No intra or extrahepatic biliary ductal dilation. Liver otherwise unremarkable. Pancreas: Unremarkable Spleen: Unremarkable Adrenals/Urinary Tract: Adrenal glands are unremarkable. Kidneys are normal, without renal calculi, focal lesion, or hydronephrosis. Bladder is unremarkable. Stomach/Bowel:  There is circumferential wall thickening, pericolonic perirectal inflammatory stranding, and mucosal hyperemia involving the distal sigmoid colon and rectum in keeping with changes of infectious or inflammatory proctocolitis. No evidence of obstruction or perforation. Superimposed moderate sigmoid diverticulosis. Stomach, small bowel, and large bowel are otherwise unremarkable. Appendix normal. No free intraperitoneal gas or fluid. Vascular/Lymphatic: Extensive aortoiliac atherosclerotic calcification. No aortic aneurysm. No pathologic adenopathy within the abdomen and pelvis. Reproductive: Uterus and bilateral adnexa are unremarkable. Other: No abdominal wall hernia or abnormality. No abdominopelvic ascites. Musculoskeletal: The osseous structures are diffusely osteopenic. Healed remote fractures are noted within the right superior inferior pubic rami. No acute bone abnormality. Marked lumbar levoscoliosis, apex left at L2. Superimposed remote anterior wedge compression deformity of T9, T10, and L1. IMPRESSION: 1. Infectious or inflammatory proctocolitis. No evidence of obstruction or perforation. 2. Interval removal of previously noted percutaneous cholecystostomy. Cholelithiasis without superimposed pericholecystic inflammatory change. Trace perihepatic fluid adjacent to the tract is unchanged, nonspecific. No loculated perihepatic fluid collections. 3. Moderate hiatal hernia. 4. Mild cardiomegaly. Aortic Atherosclerosis (ICD10-I70.0). Electronically Signed   By: Helyn Numbers M.D.   On: 03/10/2023 20:24      Assessment/Plan Principal Problem:   C. difficile colitis Active Problems:   CAP (community acquired pneumonia)   Sepsis (HCC)   Paroxysmal atrial fibrillation (HCC)   Chronic diastolic CHF (congestive heart failure) (HCC)   Hypothyroidism   Chronic kidney disease, stage 3a (HCC)   Cholecystitis   Assessment and Plan:  C. difficile colitis: Patient has positive C. difficile antigen and  positive toxin.  Patient meets criteria for sepsis as below.  Patient received 1 dose of Zosyn before C. difficile test came back.  -Admitted to PCU as inpatient -Oral vancomycin 125 mg 4 times per day -As needed Zofran -IV fluid  CAP (community acquired pneumonia): Patient has oxygen desaturation to 77% when sleeping, no respiratory distress.  Chest x-ray showed possible pneumonia. -IV Rocephin and azithromycin  -Mucinex for cough  - Bronchodilators - Urine legionella and S. pneumococcal antigen - Follow up blood culture x2, sputum culture  Sepsis due to C. difficile and CAP: Patient meets criteria for sepsis with WBC 19.7, RR 28.  Lactic acid normal 1.4.  Blood pressure intermittently hypotensive. -Antibiotics as above -IV fluid: Fluid 2 over 1.5 L normal saline, then 75 cc/h -Started midodrine 10 mg 3 times daily  Paroxysmal atrial fibrillation (HCC): Heart rate 70s -Eliquis -Hold metoprolol due to hypotension -Amiodarone  Chronic diastolic CHF (congestive heart failure) (HCC): 2D echo on 08/17/2018 showed EF 60 to 65%.  Patient has elevated BNP of 327, but no leg edema JVD.  Does not seem to have CHF exacerbation. -Hold off diuretics due to sepsis  Hypothyroidism -Synthroid  Chronic kidney disease, stage 3a (HCC): Renal function stable\ -Monitor with BMP  History of cholecystitis: -Has chronic biliary drainage in place    DVT ppx: on Eliquis  Code Status: Full code per her daughter  Family Communication:  Yes, patient's daughter  at bed side.   Disposition Plan:  Anticipate discharge back to previous environment  Consults called:  none  Admission status and Level of care: Progressive:   as inpt       Dispo: The patient is from: Home  Anticipated d/c is to: Home              Anticipated d/c date is: 2 days              Patient currently is not medically stable to d/c.    Severity of Illness:  The appropriate patient status for this patient is  INPATIENT. Inpatient status is judged to be reasonable and necessary in order to provide the required intensity of service to ensure the patient's safety. The patient's presenting symptoms, physical exam findings, and initial radiographic and laboratory data in the context of their chronic comorbidities is felt to place them at high risk for further clinical deterioration. Furthermore, it is not anticipated that the patient will be medically stable for discharge from the hospital within 2 midnights of admission.   * I certify that at the point of admission it is my clinical judgment that the patient will require inpatient hospital care spanning beyond 2 midnights from the point of admission due to high intensity of service, high risk for further deterioration and high frequency of surveillance required.*       Date of Service 03/11/2023    Lorretta Harp [facility]   If 7PM-7AM, please contact night-coverage [email or web address] 03/11/2023, 12:42 AM

## 2023-03-10 NOTE — Telephone Encounter (Signed)
  Symptoms: Diarrhea a lot, abdominal pain, weakness, nausea 2 rounds of antibiotics daughter is worried about possible UTI  Has not eaten in a few days or drink   Attributing factors (medication changes, positional changes, etc. )  No changes in medication. Did give her something for gas    Duration :     Pain Scale?  On 1-10 how woiuld you rate your pain? What makes it better or worse?  8     Blood pressure            Pulse             Temp           Patients daughter called. I triaged patient. No appointments were available for today.

## 2023-03-11 ENCOUNTER — Encounter: Payer: Self-pay | Admitting: Internal Medicine

## 2023-03-11 ENCOUNTER — Encounter: Payer: Medicare Other | Admitting: *Deleted

## 2023-03-11 DIAGNOSIS — A419 Sepsis, unspecified organism: Secondary | ICD-10-CM | POA: Diagnosis not present

## 2023-03-11 DIAGNOSIS — K819 Cholecystitis, unspecified: Secondary | ICD-10-CM | POA: Diagnosis present

## 2023-03-11 DIAGNOSIS — A0472 Enterocolitis due to Clostridium difficile, not specified as recurrent: Secondary | ICD-10-CM | POA: Diagnosis present

## 2023-03-11 DIAGNOSIS — I5032 Chronic diastolic (congestive) heart failure: Secondary | ICD-10-CM | POA: Diagnosis not present

## 2023-03-11 DIAGNOSIS — J189 Pneumonia, unspecified organism: Secondary | ICD-10-CM | POA: Diagnosis not present

## 2023-03-11 LAB — SARS CORONAVIRUS 2 BY RT PCR: SARS Coronavirus 2 by RT PCR: NEGATIVE

## 2023-03-11 LAB — CBC
HCT: 33.6 % — ABNORMAL LOW (ref 36.0–46.0)
Hemoglobin: 11.2 g/dL — ABNORMAL LOW (ref 12.0–15.0)
MCH: 31.7 pg (ref 26.0–34.0)
MCHC: 33.3 g/dL (ref 30.0–36.0)
MCV: 95.2 fL (ref 80.0–100.0)
Platelets: 268 10*3/uL (ref 150–400)
RBC: 3.53 MIL/uL — ABNORMAL LOW (ref 3.87–5.11)
RDW: 12.7 % (ref 11.5–15.5)
WBC: 16.8 10*3/uL — ABNORMAL HIGH (ref 4.0–10.5)
nRBC: 0 % (ref 0.0–0.2)

## 2023-03-11 LAB — BASIC METABOLIC PANEL
Anion gap: 4 — ABNORMAL LOW (ref 5–15)
BUN: 12 mg/dL (ref 8–23)
CO2: 20 mmol/L — ABNORMAL LOW (ref 22–32)
Calcium: 7.1 mg/dL — ABNORMAL LOW (ref 8.9–10.3)
Chloride: 111 mmol/L (ref 98–111)
Creatinine, Ser: 0.78 mg/dL (ref 0.44–1.00)
GFR, Estimated: >60 mL/min (ref >60)
Glucose, Bld: 90 mg/dL (ref 70–99)
Potassium: 3.1 mmol/L — ABNORMAL LOW (ref 3.5–5.1)
Sodium: 135 mmol/L (ref 135–145)

## 2023-03-11 LAB — MAGNESIUM: Magnesium: 1.7 mg/dL (ref 1.7–2.4)

## 2023-03-11 MED ORDER — POTASSIUM CHLORIDE 20 MEQ PO PACK
40.0000 meq | PACK | Freq: Once | ORAL | Status: AC
  Administered 2023-03-11: 40 meq via ORAL
  Filled 2023-03-11: qty 2

## 2023-03-11 MED ORDER — SODIUM CHLORIDE 0.9 % IV SOLN
500.0000 mg | INTRAVENOUS | Status: DC
  Administered 2023-03-11: 500 mg via INTRAVENOUS
  Filled 2023-03-11: qty 5

## 2023-03-11 MED ORDER — SODIUM CHLORIDE 0.9% FLUSH
3.0000 mL | Freq: Two times a day (BID) | INTRAVENOUS | Status: DC
  Administered 2023-03-11 – 2023-03-12 (×3): 3 mL via INTRAVENOUS

## 2023-03-11 MED ORDER — MIDODRINE HCL 5 MG PO TABS
10.0000 mg | ORAL_TABLET | Freq: Three times a day (TID) | ORAL | Status: DC
  Administered 2023-03-11 – 2023-03-12 (×5): 10 mg via ORAL
  Filled 2023-03-11 (×5): qty 2

## 2023-03-11 MED ORDER — SODIUM CHLORIDE 0.9 % IV SOLN: INTRAVENOUS | Status: DC

## 2023-03-11 MED ORDER — SODIUM CHLORIDE 0.9 % IV BOLUS
1000.0000 mL | Freq: Once | INTRAVENOUS | Status: AC
  Administered 2023-03-11: 1000 mL via INTRAVENOUS

## 2023-03-11 MED ORDER — SODIUM CHLORIDE 0.9 % IV SOLN
1.0000 g | INTRAVENOUS | Status: DC
  Administered 2023-03-11: 1 g via INTRAVENOUS
  Filled 2023-03-11: qty 10

## 2023-03-11 NOTE — Assessment & Plan Note (Signed)
Patient has positive C. difficile antigen and positive toxin.  Started on p.o. vancomycin-clinically seems improving. -Continue with p.o. vancomycin for 10 days -Continue with supportive care

## 2023-03-11 NOTE — Assessment & Plan Note (Signed)
2D echo on 08/17/2018 showed EF 60 to 65%.  Patient has elevated BNP of 327, but no leg edema JVD.  Does not seem to have CHF exacerbation. -Hold off diuretics due to sepsis

## 2023-03-11 NOTE — Assessment & Plan Note (Signed)
No acute concern, patient has an history of cholecystitis s/p chronic drain in place.

## 2023-03-11 NOTE — Progress Notes (Signed)
Progress Note   Patient: Kylie May:[id number] DOB: 1921-09-24 DOA: 03/10/2023     1 DOS: the patient was seen and examined on 03/11/2023   Brief hospital course: Taken from H&P.   Kylie May is a 89 y.o. female with medical history significant of hyperlipidemia, diastolic CHF, hypothyroidism, depression, CKD-3A, anemia, A-fib on Eliquis, diverticulitis, pancreatitis, esophageal stricture, cholecystitis with biliary drainage in place, who presents with nausea, vomiting, diarrhea and abdominal pain, for about 5 days. ROS was also positive for dry cough and mild shortness of breath.  No urinary symptoms  On arrival patient had softer blood pressure which improved with 500 cc of normal saline.  She was tested positive for C. difficile antigen and toxin, GI pathogen panel negative.  WBC 19.7, stable renal function and no lactic acidosis. CT abdomen and pelvis with infectious or inflammatory proctocolitis.  Cholelithiasis without superimposed pericholecystic inflammatory change. CXR with left lower lobe airspace opacity which may represent atelectasis versus infection.  Patient was started on p.o. vancomycin.  10/8: Vital stable.  Improving leukocytosis, potassium 3.1, stable bicarb at 20, COVID-19 PCR negative, BNP 327.  Continue to have some diarrhea but abdominal pain improving.  Started tolerating p.o. Procalcitonin 0.15, not much upper respiratory symptoms so less likely any pneumonia, stopping ceftriaxone and Zithromax.    Assessment and Plan: * C. difficile colitis Patient has positive C. difficile antigen and positive toxin.  Started on p.o. vancomycin-clinically seems improving. -Continue with p.o. vancomycin for 10 days -Continue with supportive care  CAP (community acquired pneumonia) There was some concern of transient desaturation, no respiratory symptoms or distress.  CT chest with no significant infiltrate and barely positive procalcitonin but patient also  has C. difficile colitis. -Stopping ceftriaxone and Zithromax -Preliminary blood cultures negative -Continue to monitor  Sepsis [facility]) Patient met sepsis criteria with leukocytosis, tachypnea.  Borderline hypotension.  Received IV fluid and started on midodrine. Likely secondary to C. difficile colitis. -Management as above  Chronic diastolic CHF (congestive heart failure) (HCC)  2D echo on 08/17/2018 showed EF 60 to 65%.  Patient has elevated BNP of 327, but no leg edema JVD.  Does not seem to have CHF exacerbation. -Hold off diuretics due to sepsis  Paroxysmal atrial fibrillation (HCC) -Continue Eliquis and amiodarone -Holding home metoprolol due to initial hypotension  Hypothyroidism -Continue home Synthroid  Chronic kidney disease, stage 3a (HCC) Seems stable. -Monitor renal function -Avoid nephrotoxins  Cholecystitis No acute concern, patient has an history of cholecystitis s/p chronic drain in place.   Subjective: Patient was feeling much improved, no more abdominal pain.  Continue to have watery diarrhea but frequency is decreasing.  Physical Exam: Vitals:   03/11/23 1100 03/11/23 1130 03/11/23 1315 03/11/23 1320  BP: (!) 115/55 (!) 124/53  (!) 123/55  Pulse: 63 60  69  Resp: 20 16 (!) 22   Temp:      TempSrc:      SpO2: 91% 90%  94%  Weight:      Height:       General.  Pleasant elderly lady, in no acute distress. Pulmonary.  Lungs clear bilaterally, normal respiratory effort. CV.  Regular rate and rhythm, no JVD, rub or murmur. Abdomen.  Soft, nontender, nondistended, BS positive. CNS.  Alert and oriented .  No focal neurologic deficit. Extremities.  No edema, no cyanosis, pulses intact and symmetrical.  Data Reviewed: Prior data reviewed  Family Communication: Discussed with daughter and grandson at bedside.  Disposition: Status is: Inpatient  Remains inpatient appropriate because: Severity of illness  Planned Discharge Destination: Home  DVT  prophylaxis.  Eliquis Time spent: 50 minutes  This record has been created using Conservation officer, historic buildings. Errors have been sought and corrected,but may not always be located. Such creation errors do not reflect on the standard of care.   Author: Arnetha Courser, MD 03/11/2023 1:58 PM  For on call review [email or web address].

## 2023-03-11 NOTE — Assessment & Plan Note (Signed)
Seems stable. ?-Monitor renal function ?-Avoid nephrotoxins ?

## 2023-03-11 NOTE — Hospital Course (Addendum)
Taken from H&P.   Kylie May is a 89 y.o. female with medical history significant of hyperlipidemia, diastolic CHF, hypothyroidism, depression, CKD-3A, anemia, A-fib on Eliquis, diverticulitis, pancreatitis, esophageal stricture, cholecystitis with biliary drainage in place, who presents with nausea, vomiting, diarrhea and abdominal pain, for about 5 days. ROS was also positive for dry cough and mild shortness of breath.  No urinary symptoms  On arrival patient had softer blood pressure which improved with 500 cc of normal saline.  She was tested positive for C. difficile antigen and toxin, GI pathogen panel negative.  WBC 19.7, stable renal function and no lactic acidosis. CT abdomen and pelvis with infectious or inflammatory proctocolitis.  Cholelithiasis without superimposed pericholecystic inflammatory change. CXR with left lower lobe airspace opacity which may represent atelectasis versus infection.  Patient was started on p.o. vancomycin.  10/8: Vital stable.  Improving leukocytosis, potassium 3.1, stable bicarb at 20, COVID-19 PCR negative, BNP 327.  Continue to have some diarrhea but abdominal pain improving.  Started tolerating p.o. Procalcitonin 0.15, not much upper respiratory symptoms so less likely any pneumonia, stopping ceftriaxone and Zithromax.

## 2023-03-11 NOTE — Assessment & Plan Note (Addendum)
There was some concern of transient desaturation, no respiratory symptoms or distress.  CT chest with no significant infiltrate and barely positive procalcitonin but patient also has C. difficile colitis. -Stopping ceftriaxone and Zithromax -Preliminary blood cultures negative -Continue to monitor

## 2023-03-11 NOTE — ED Notes (Signed)
This RN cleaned pt up, family at bedside. Denies other needs at this time

## 2023-03-11 NOTE — ED Notes (Signed)
Pt had a bowel movement,this tech cleaned pt up. No other needs at the moment.

## 2023-03-11 NOTE — Assessment & Plan Note (Signed)
Patient met sepsis criteria with leukocytosis, tachypnea.  Borderline hypotension.  Received IV fluid and started on midodrine. Likely secondary to C. difficile colitis. -Management as above

## 2023-03-11 NOTE — Assessment & Plan Note (Signed)
-  Continue Eliquis and amiodarone -Holding home metoprolol due to initial hypotension

## 2023-03-11 NOTE — Assessment & Plan Note (Signed)
-

## 2023-03-12 ENCOUNTER — Other Ambulatory Visit (HOSPITAL_COMMUNITY): Payer: Self-pay

## 2023-03-12 ENCOUNTER — Inpatient Hospital Stay: Payer: Medicare Other | Admitting: Radiology

## 2023-03-12 DIAGNOSIS — A0472 Enterocolitis due to Clostridium difficile, not specified as recurrent: Secondary | ICD-10-CM | POA: Diagnosis not present

## 2023-03-12 HISTORY — PX: IR EXCHANGE BILIARY DRAIN: IMG6046

## 2023-03-12 LAB — CBC
HCT: 33 % — ABNORMAL LOW (ref 36.0–46.0)
Hemoglobin: 11.1 g/dL — ABNORMAL LOW (ref 12.0–15.0)
MCH: 31.7 pg (ref 26.0–34.0)
MCHC: 33.6 g/dL (ref 30.0–36.0)
MCV: 94.3 fL (ref 80.0–100.0)
Platelets: 313 10*3/uL (ref 150–400)
RBC: 3.5 MIL/uL — ABNORMAL LOW (ref 3.87–5.11)
RDW: 13 % (ref 11.5–15.5)
WBC: 13.9 10*3/uL — ABNORMAL HIGH (ref 4.0–10.5)
nRBC: 0 % (ref 0.0–0.2)

## 2023-03-12 LAB — BASIC METABOLIC PANEL
Anion gap: 6 (ref 5–15)
BUN: 17 mg/dL (ref 8–23)
CO2: 19 mmol/L — ABNORMAL LOW (ref 22–32)
Calcium: 8.2 mg/dL — ABNORMAL LOW (ref 8.9–10.3)
Chloride: 107 mmol/L (ref 98–111)
Creatinine, Ser: 0.81 mg/dL (ref 0.44–1.00)
GFR, Estimated: >60 mL/min (ref >60)
Glucose, Bld: 106 mg/dL — ABNORMAL HIGH (ref 70–99)
Potassium: 3.9 mmol/L (ref 3.5–5.1)
Sodium: 132 mmol/L — ABNORMAL LOW (ref 135–145)

## 2023-03-12 MED ORDER — ADULT MULTIVITAMIN W/MINERALS CH
1.0000 | ORAL_TABLET | Freq: Every day | ORAL | Status: DC
  Administered 2023-03-12: 1 via ORAL
  Filled 2023-03-12: qty 1

## 2023-03-12 MED ORDER — VANCOMYCIN HCL 125 MG PO CAPS
125.0000 mg | ORAL_CAPSULE | Freq: Four times a day (QID) | ORAL | Status: DC
  Administered 2023-03-12 (×2): 125 mg via ORAL
  Filled 2023-03-12 (×3): qty 1

## 2023-03-12 MED ORDER — VANCOMYCIN 50 MG/ML ORAL SOLUTION
125.0000 mg | Freq: Four times a day (QID) | ORAL | Status: DC
  Administered 2023-03-12: 125 mg via ORAL
  Filled 2023-03-12 (×3): qty 2.5

## 2023-03-12 MED ORDER — LIDOCAINE HCL 1 % IJ SOLN
INTRAMUSCULAR | Status: AC
  Filled 2023-03-12: qty 20

## 2023-03-12 MED ORDER — VANCOMYCIN HCL 125 MG PO CAPS
125.0000 mg | ORAL_CAPSULE | Freq: Once | ORAL | Status: AC
  Administered 2023-03-12: 125 mg via ORAL
  Filled 2023-03-12 (×2): qty 1

## 2023-03-12 MED ORDER — IOHEXOL 300 MG/ML  SOLN: 8.0000 mL | Freq: Once | INTRAMUSCULAR | Status: DC | PRN

## 2023-03-12 MED ORDER — VANCOMYCIN HCL 125 MG PO CAPS: 125.0000 mg | ORAL_CAPSULE | Freq: Four times a day (QID) | ORAL | 0 refills | Status: AC

## 2023-03-12 MED ORDER — ENSURE ENLIVE PO LIQD: 237.0000 mL | Freq: Two times a day (BID) | ORAL | Status: DC

## 2023-03-12 MED ORDER — ONDANSETRON HCL 4 MG PO TABS: 4.0000 mg | ORAL_TABLET | Freq: Every day | ORAL | 1 refills | Status: AC | PRN

## 2023-03-12 MED ORDER — LIDOCAINE HCL 1 % IJ SOLN
2.0000 mL | Freq: Once | INTRAMUSCULAR | Status: AC
  Administered 2023-03-12: 2 mL via INTRADERMAL
  Filled 2023-03-12: qty 2

## 2023-03-12 NOTE — Progress Notes (Signed)
Initial Nutrition Assessment  DOCUMENTATION CODES:   Not applicable  INTERVENTION:   -D/c Ensure Enlive -MVI with minerals daily -Liberalize diet to regular for widest variety of meal selections  NUTRITION DIAGNOSIS:   Increased nutrient needs related to acute illness as evidenced by estimated needs.  GOAL:   Patient will meet greater than or equal to 90% of their needs  MONITOR:   PO intake, Supplement acceptance  REASON FOR ASSESSMENT:   Malnutrition Screening Tool    ASSESSMENT:   Kylie May with medical history significant of hyperlipidemia, diastolic CHF, hypothyroidism, depression, CKD-3A, anemia, A-fib on Eliquis, diverticulitis, pancreatitis, esophageal stricture, cholecystitis with biliary drainage in place, who presents with nausea, vomiting, diarrhea and abdominal pain.  Kylie May admitted with c-diff.   Reviewed I/O's: +1.2 L x 24 hours and +4.2 L since admission  Spoke with Kylie May and grandson at bedside. Kylie May is hard of hearting, so history deferred to grandson. Kylie May lives in her home and family members rotate to check on her. Per grandson, Kylie May is a selective eater and family members have been trying to get creative to get nutrition in her. She generally consumes 3 meals per day, but she is not a big eater. Frequently consumed foods include cereal, grits, and sandwiches. Kylie May also consumes 1-2 [employer] Drinks with 30 grams of protein. Lucila Maine shares that multiple supplemental drinks have been trialed and this is the only one she will drink. Family members have also been bringing in outside food to encourage Kylie May to eat.   Observed breakfast tray; Kylie May consumed 100% of cheerios and 100% of [employer] drink.   Kylie May denies any weight loss. Noted wt has been stable over the past 9 months. Kylie May ambulates with a walker; denies any falls of weakness.   Discussed importance of good meal and supplement intake to promote healing. RD will liberalize diet for wider variety of meal  selections. Family agreeable to this as Kylie May has limited selection on heart healthy diet. Encouraged family to bring in outside food as desired.   Medications reviewed and include ferrous sulfate.   No results found for: "HGBA1C" PTA DM medications are none.   Labs reviewed: Na: 132, CBGS: 76 (inpatient orders for glycemic control are none).    NUTRITION - FOCUSED PHYSICAL EXAM:  Flowsheet Row Most Recent Value  Orbital Region No depletion  Upper Arm Region Mild depletion  Thoracic and Lumbar Region No depletion  Buccal Region No depletion  Temple Region No depletion  Clavicle Bone Region Mild depletion  Clavicle and Acromion Bone Region Mild depletion  Scapular Bone Region Mild depletion  Dorsal Hand Mild depletion  Patellar Region No depletion  Anterior Thigh Region No depletion  Posterior Calf Region No depletion  Edema (RD Assessment) None  Hair Reviewed  Eyes Reviewed  Mouth Reviewed  Skin Reviewed  Nails Reviewed       Diet Order:   Diet Order             Diet regular Room service appropriate? Yes; Fluid consistency: Thin  Diet effective now                   EDUCATION NEEDS:   Education needs have been addressed  Skin:  Skin Assessment: Reviewed RN Assessment  Last BM:  03/12/23 (type 6)  Height:   Ht Readings from Last 1 Encounters:  03/10/23 4\' 11"  (1.499 m)    Weight:   Wt Readings from Last 1 Encounters:  03/10/23 50.8 kg  Ideal Body Weight:  44.6 kg  BMI:  Body mass index is 22.62 kg/m.  Estimated Nutritional Needs:   Kcal:  1300-1500  Protein:  60-75 grams  Fluid:  > 1.3 L    Levada Schilling, RD, LDN, CDCES Registered Dietitian III Certified Diabetes Care and Education Specialist Please refer to [facility] for RD and/or RD on-call/weekend/after hours pager

## 2023-03-12 NOTE — Discharge Summary (Signed)
Kylie May Kylie May:[id number] DOB: August 18, 1921 DOA: 03/10/2023  PCP: [name], MD  Admit date: 03/10/2023 Discharge date: 03/12/2023  Time spent: 35 minutes  Recommendations for Outpatient Follow-up:  Pcp f/u 1 week     Discharge Diagnoses:  Principal Problem:   C. difficile colitis Active Problems:   CAP (community acquired pneumonia)   Sepsis (HCC)   Paroxysmal atrial fibrillation (HCC)   Chronic diastolic CHF (congestive heart failure) (HCC)   Hypothyroidism   Chronic kidney disease, stage 3a (HCC)   Cholecystitis   Discharge Condition: improved  Diet recommendation: regular  Filed Weights   03/10/23 1448  Weight: 50.8 kg    History of present illness:  From admission h and p Kylie May is a 89 y.o. female with medical history significant of hyperlipidemia, diastolic CHF, hypothyroidism, depression, CKD-3A, anemia, A-fib on Eliquis, diverticulitis, pancreatitis, esophageal stricture, cholecystitis with biliary drainage in place, who presents with nausea, vomiting, diarrhea and abdominal pain.   For her daughter at the bedside, patient symptoms have been going on for more than 5 days.  Patient has had nausea, vomited few times.  Patient has multiple episodes of watery diarrhea.  Patient has abdominal pain which is located in lower abdomen, constant, mild to moderate, aching, nonradiating.  Not aggravated or alleviated by any known factors.  Patient does not have fever or chills.  Patient does not have chest pain.  Patient has dry cough and mild shortness of breath. Denies symptoms of UTI.  Hospital Course:  Patient presents with diarrhea in the setting of two recent rounds of antibiotics for dental infection. C diff positive, this is first occurrence. Severe by WBCs greater than 15k. No complications seen on CT, and no AKI. Patient is tolerating PO. Started on oral vancomycin and diarrhea much improved. Plan will be to complete a 10-day course of oral  vancomycin. Unrelated to the above, the patient's chronic cholecystostomy drain came out day of discharge. IR was consulted and a new drain was placed and confirmed to be functional. They advise continue prior 6-8 week drain changes. For soft BPs and daughter's concern about BP we advise holding metoprolol until PCP f/u.   Procedures: IR cholecystostomy drain exchange  Consultations: IR  Discharge Exam: Vitals:   03/12/23 1218 03/12/23 1557  BP: (!) 119/48 (!) 151/62  Pulse: 71 77  Resp:    Temp: 98.2 F (36.8 C) 97.9 F (36.6 C)  SpO2: 97% 100%    General.  Pleasant elderly lady, in no acute distress. Pulmonary.  Lungs clear bilaterally, normal respiratory effort. CV.  Regular rate and rhythm, no JVD, rub or murmur. Abdomen.  Soft, nontender, nondistended, BS positive. CNS.  Alert and oriented .  No focal neurologic deficit. Extremities.  No edema, no cyanosis, pulses intact and symmetrical.  Discharge Instructions   Discharge Instructions     Diet general   Complete by: As directed    Increase activity slowly   Complete by: As directed       Allergies as of 03/12/2023       Reactions   Metronidazole    Other reaction(s): Dizziness and giddiness (finding), Other (qualifier value) made things feel like they were vibrating like the bed and floor,also caused mild dizziness weakness   Penicillin G Rash        Medication List     STOP taking these medications    metoprolol tartrate 25 MG tablet Commonly known as: LOPRESSOR       TAKE these  medications    acetaminophen 325 MG tablet Commonly known as: TYLENOL Take 2 tablets (650 mg total) by mouth every 6 (six) hours as needed for mild pain (or Fever >/= 101).   amiodarone 200 MG tablet Commonly known as: Pacerone Take 1 tablet (200 mg total) by mouth daily.   apixaban 2.5 MG Tabs tablet Commonly known as: ELIQUIS Take 1 tablet (2.5 mg total) by mouth 2 (two) times daily.   azelastine 0.1 % nasal  spray Commonly known as: ASTELIN Place 2 sprays into both nostrils 2 (two) times daily. Use in each nostril as directed   CRANBERRY PO Take 2 tablets by mouth daily.   famotidine 20 MG tablet Commonly known as: PEPCID Take 20 mg by mouth at bedtime.   ferrous sulfate 325 (65 FE) MG EC tablet Take 325 mg by mouth daily.   fluticasone 50 MCG/ACT nasal spray Commonly known as: FLONASE Place 2 sprays into both nostrils daily.   hydrocortisone 25 MG suppository Commonly known as: ANUSOL-HC Place 1 suppository (25 mg total) rectally 2 (two) times daily as needed for hemorrhoids or anal itching.   levothyroxine 50 MCG tablet Commonly known as: SYNTHROID Take 1 tablet (50 mcg total) by mouth daily.   magnesium chloride 64 MG Tbec SR tablet Commonly known as: SLOW-MAG Take 2 tablets (128 mg total) by mouth daily.   montelukast 10 MG tablet Commonly known as: SINGULAIR Take 1 tablet (10 mg total) by mouth at bedtime.   Normal Saline Flush 0.9 % Soln FLUSH ONCE DAILY AS DIRECTED   nystatin cream Commonly known as: MYCOSTATIN nystatin 100,000 unit/gram topical cream  APPLY TOPICALLY TO THE AFFECTED AREA TWICE DAILY   omeprazole 40 MG capsule Commonly known as: PRILOSEC TAKE ONE CAPSULE BY MOUTH EVERY MORNING AND 1 AT BEDTIME AS DIRECTED   PROBIOTIC PO Take 1 capsule by mouth 2 (two) times daily.   vancomycin 125 MG capsule Commonly known as: VANCOCIN Take 1 capsule (125 mg total) by mouth 4 (four) times daily for 8 days.       Allergies  Allergen Reactions   Metronidazole     Other reaction(s): Dizziness and giddiness (finding), Other (qualifier value) made things feel like they were vibrating like the bed and floor,also caused mild dizziness weakness   Penicillin G Rash    Follow-up Information     [name], MD Follow up.   Specialty: Family Medicine Contact information: [address] [place] [place] [address] [phone]                   The results of significant diagnostics from this hospitalization (including imaging, microbiology, ancillary and laboratory) are listed below for reference.    Significant Diagnostic Studies: DG Chest Portable 1 View  Result Date: 03/10/2023 CLINICAL DATA:  Sob Pt has had dec oral intake. Pt states belly pain. Temp-100 BP-112/40 Pt did drink some water and threw it up this morning EXAM: PORTABLE CHEST 1 VIEW COMPARISON:  Chest x-ray 01/30/2023, CT chest 02/20/2023, CT abdomen pelvis 03/10/2023 FINDINGS: The heart and mediastinal contours are within normal limits. Retrocardiac gas density consistent with known at least moderate volume hiatal hernia. Developing right base airspace opacity. Left lower lobe airspace opacity. No pulmonary edema. Possible trace left pleural effusion. No pneumothorax. No acute osseous abnormality. IMPRESSION: 1. Left lower lobe airspace opacity that may represent a combination of atelectasis versus infection. 2.  Developing right base airspace opacity. 3. Trace left pleural effusion. 4. At least  moderate volume hiatal hernia. Electronically Signed   By: Tish Frederickson M.D.   On: 03/10/2023 22:05   CT Chest Wo Contrast  Result Date: 03/10/2023 CLINICAL DATA:  Pulmonary nodule EXAM: CT CHEST WITHOUT CONTRAST TECHNIQUE: Multidetector CT imaging of the chest was performed following the standard protocol without IV contrast. RADIATION DOSE REDUCTION: This exam was performed according to the departmental dose-optimization program which includes automated exposure control, adjustment of the mA and/or kV according to patient size and/or use of iterative reconstruction technique. COMPARISON:  05/22/2021 FINDINGS: Cardiovascular: No significant coronary artery calcification. Mild cardiomegaly. No pericardial effusion. Central pulmonary arteries are enlarged in keeping with changes of pulmonary arterial hypertension. Moderate atherosclerotic calcification within the thoracic  aorta no aortic aneurysm. Mediastinum/Nodes: Visualized thyroid is unremarkable. No pathologic thoracic adenopathy. Moderate hiatal hernia. Esophagus unremarkable. Lungs/Pleura: Trace left pleural effusion. Bibasilar atelectasis. No confluent pulmonary infiltrate. No suspicious or indeterminate focal pulmonary nodules identified. No pneumothorax. No central obstructing lesion. Upper Abdomen: Percutaneous cholecystostomy in place with associated pneumobilia. Cholelithiasis noted. No acute abnormality. Musculoskeletal: The osseous structures are diffusely osteopenic. Marked thoracolumbar sigmoid scoliosis, apex right at T6 and apex left at L2. Remote anterior wedge compression deformities of T9, T10, and L1 are noted. No acute bone abnormality. Remote healed sternal fracture. IMPRESSION: 1. No suspicious or indeterminate focal pulmonary nodules identified. 2. Trace left pleural effusion with bibasilar atelectasis. 3. Mild cardiomegaly. 4. Moderate hiatal hernia. 5. Percutaneous cholecystostomy in place with associated pneumobilia. Cholelithiasis. Aortic Atherosclerosis (ICD10-I70.0). Electronically Signed   By: Helyn Numbers M.D.   On: 03/10/2023 20:34   CT ABDOMEN PELVIS W CONTRAST  Result Date: 03/10/2023 CLINICAL DATA:  Acute nonlocalized abdominal pain. EXAM: CT ABDOMEN AND PELVIS WITH CONTRAST TECHNIQUE: Multidetector CT imaging of the abdomen and pelvis was performed using the standard protocol following bolus administration of intravenous contrast. RADIATION DOSE REDUCTION: This exam was performed according to the departmental dose-optimization program which includes automated exposure control, adjustment of the mA and/or kV according to patient size and/or use of iterative reconstruction technique. CONTRAST:  75mL OMNIPAQUE IOHEXOL 300 MG/ML  SOLN COMPARISON:  05/22/2021 FINDINGS: Lower chest: No acute abnormality. Bibasilar atelectasis. Cardiac size is mildly enlarged. Moderate hiatal hernia  Hepatobiliary: Previously noted percutaneous cholecystostomy has been removed. Cholelithiasis noted without superimposed pericholecystic inflammatory change. Infiltration adjacent to the gallbladder fundus represents the residual cholecystostomy tract. Trace perihepatic fluid adjacent to the tract is unchanged, nonspecific. No loculated perihepatic fluid collections. Mild pneumobilia may relate to changes of sphincterotomy. No intra or extrahepatic biliary ductal dilation. Liver otherwise unremarkable. Pancreas: Unremarkable Spleen: Unremarkable Adrenals/Urinary Tract: Adrenal glands are unremarkable. Kidneys are normal, without renal calculi, focal lesion, or hydronephrosis. Bladder is unremarkable. Stomach/Bowel: There is circumferential wall thickening, pericolonic perirectal inflammatory stranding, and mucosal hyperemia involving the distal sigmoid colon and rectum in keeping with changes of infectious or inflammatory proctocolitis. No evidence of obstruction or perforation. Superimposed moderate sigmoid diverticulosis. Stomach, small bowel, and large bowel are otherwise unremarkable. Appendix normal. No free intraperitoneal gas or fluid. Vascular/Lymphatic: Extensive aortoiliac atherosclerotic calcification. No aortic aneurysm. No pathologic adenopathy within the abdomen and pelvis. Reproductive: Uterus and bilateral adnexa are unremarkable. Other: No abdominal wall hernia or abnormality. No abdominopelvic ascites. Musculoskeletal: The osseous structures are diffusely osteopenic. Healed remote fractures are noted within the right superior inferior pubic rami. No acute bone abnormality. Marked lumbar levoscoliosis, apex left at L2. Superimposed remote anterior wedge compression deformity of T9, T10, and L1. IMPRESSION: 1. Infectious or inflammatory proctocolitis. No evidence  of obstruction or perforation. 2. Interval removal of previously noted percutaneous cholecystostomy. Cholelithiasis without superimposed  pericholecystic inflammatory change. Trace perihepatic fluid adjacent to the tract is unchanged, nonspecific. No loculated perihepatic fluid collections. 3. Moderate hiatal hernia. 4. Mild cardiomegaly. Aortic Atherosclerosis (ICD10-I70.0). Electronically Signed   By: Helyn Numbers M.D.   On: 03/10/2023 20:24   IR EXCHANGE BILIARY DRAIN  Result Date: 02/25/2023 INDICATION: CHRONIC PERCUTANEOUS CHOLECYSTOSTOMY EXCHANGE EXAM: FLUOROSCOPIC EXCHANGE OF THE 12 FRENCH CHOLECYSTOSTOMY MEDICATIONS: NONE. ANESTHESIA/SEDATION: NONE. FLUOROSCOPY: Radiation Exposure Index (as provided by the fluoroscopic device): 6.4 mGy Kerma COMPLICATIONS: None immediate. PROCEDURE: Informed written consent was obtained from the patient after a thorough discussion of the procedural risks, benefits and alternatives. All questions were addressed. Maximal Sterile Barrier Technique was utilized including caps, mask, sterile gowns, sterile gloves, sterile drape, hand hygiene and skin antiseptic. A timeout was performed prior to the initiation of the procedure. under sterile conditions, the existing 12 french cholecystostomy was successfully exchanged over a short amplatz guidewire. retention loop formed and the gallbladder lumen. contrast injection confirms position. images obtained for documentation. catheter secured externally and connected to external gravity drainage bag. sterile dressing applied. no immediate complication. patient tolerated the procedure well. IMPRESSION: Successful 12 [place] cholecystostomy exchange Electronically Signed   By: Judie Petit.  Shick M.D.   On: 02/25/2023 14:01    Microbiology: Recent Results (from the past 240 hour(s))  Blood culture (routine x 2)     Status: None (Preliminary result)   Collection Time: 03/10/23  6:19 PM   Specimen: BLOOD  Result Value Ref Range Status   Specimen Description BLOOD BLOOD RIGHT ARM  Final   Special Requests   Final    BOTTLES DRAWN AEROBIC AND ANAEROBIC Blood Culture  adequate volume   Culture   Final    NO GROWTH < 12 HOURS Performed at [facility], [address] [place], [place] [address]    Report Status PENDING  Incomplete  Blood culture (routine x 2)     Status: None (Preliminary result)   Collection Time: 03/10/23  6:36 PM   Specimen: BLOOD  Result Value Ref Range Status   Specimen Description BLOOD BLOOD LEFT HAND  Final   Special Requests   Final    BOTTLES DRAWN AEROBIC AND ANAEROBIC Blood Culture adequate volume   Culture   Final    NO GROWTH < 12 HOURS Performed at [facility], [address] [place], [place] [address]    Report Status PENDING  Incomplete  Gastrointestinal Panel by PCR , Stool     Status: None   Collection Time: 03/10/23  9:46 PM   Specimen: Stool  Result Value Ref Range Status   Campylobacter species NOT DETECTED NOT DETECTED Final   Plesimonas shigelloides NOT DETECTED NOT DETECTED Final   Salmonella species NOT DETECTED NOT DETECTED Final   Yersinia enterocolitica NOT DETECTED NOT DETECTED Final   Vibrio species NOT DETECTED NOT DETECTED Final   Vibrio cholerae NOT DETECTED NOT DETECTED Final   Enteroaggregative E coli (EAEC) NOT DETECTED NOT DETECTED Final   Enteropathogenic E coli (EPEC) NOT DETECTED NOT DETECTED Final   Enterotoxigenic E coli (ETEC) NOT DETECTED NOT DETECTED Final   Shiga like toxin producing E coli (STEC) NOT DETECTED NOT DETECTED Final   Shigella/Enteroinvasive E coli (EIEC) NOT DETECTED NOT DETECTED Final   Cryptosporidium NOT DETECTED NOT DETECTED Final   Cyclospora cayetanensis NOT DETECTED NOT DETECTED Final   Entamoeba histolytica NOT DETECTED NOT DETECTED Final  Giardia lamblia NOT DETECTED NOT DETECTED Final   Adenovirus F40/41 NOT DETECTED NOT DETECTED Final   Astrovirus NOT DETECTED NOT DETECTED Final   Norovirus GI/GII NOT DETECTED NOT DETECTED Final   Rotavirus A NOT DETECTED NOT DETECTED Final   Sapovirus (I, II, IV, and V) NOT DETECTED NOT DETECTED  Final    Comment: Performed at [facility], [address] [place], [place] [address]  C Difficile Quick Screen w PCR reflex     Status: Abnormal   Collection Time: 03/10/23  9:46 PM   Specimen: STOOL  Result Value Ref Range Status   C Diff antigen POSITIVE (A) NEGATIVE Final   C Diff toxin POSITIVE (A) NEGATIVE Final   C Diff interpretation Toxin producing C. difficile detected.  Final    Comment: RESULT CALLED TO, READ BACK BY AND VERIFIED WITH: MICHAEL [email or web address]  03/10/23 RH Performed at [facility], [address] [place], [place] [address]   SARS Coronavirus 2 by RT PCR (hospital order, performed in [facility] hospital lab) *cepheid single result test* Anterior Nasal Swab     Status: None   Collection Time: 03/11/23  5:00 AM   Specimen: Anterior Nasal Swab  Result Value Ref Range Status   SARS Coronavirus 2 by RT PCR NEGATIVE NEGATIVE Final    Comment: (NOTE) SARS-CoV-2 target nucleic acids are NOT DETECTED.  The SARS-CoV-2 RNA is generally detectable in upper and lower respiratory specimens during the acute phase of infection. The lowest concentration of SARS-CoV-2 viral copies this assay can detect is 250 copies / mL. A negative result does not preclude SARS-CoV-2 infection and should not be used as the sole basis for treatment or other patient management decisions.  A negative result may occur with improper specimen collection / handling, submission of specimen other than nasopharyngeal swab, presence of viral mutation(s) within the areas targeted by this assay, and inadequate number of viral copies (<250 copies / mL). A negative result must be combined with clinical observations, patient history, and epidemiological information.  Fact Sheet for Patients:   RoadLapTop.co.za  Fact Sheet for Healthcare Providers: http://kim-miller.com/  This test is not yet approved or  cleared by the [place] FDA  and has been authorized for detection and/or diagnosis of SARS-CoV-2 by FDA under an Emergency Use Authorization (EUA).  This EUA will remain in effect (meaning this test can be used) for the duration of the COVID-19 declaration under Section 564(b)(1) of the Act, 21 U.S.C. section 360bbb-3(b)(1), unless the authorization is terminated or revoked sooner.  Performed at [facility], [address] [place], [place] [address]      Labs: Basic Metabolic Panel: Recent Labs  Lab 03/10/23 1414 03/11/23 0500 03/12/23 0411  NA 130* 135 132*  K 3.6 3.1* 3.9  CL 100 111 107  CO2 20* 20* 19*  GLUCOSE 140* 90 106*  BUN 17 12 17   CREATININE 0.98 0.78 0.81  CALCIUM 8.6* 7.1* 8.2*  MG  --  1.7  --    Liver Function Tests: Recent Labs  Lab 03/10/23 1414  AST 31  ALT 25  ALKPHOS 49  BILITOT 0.6  PROT 6.3*  ALBUMIN 2.7*   Recent Labs  Lab 03/10/23 1414  LIPASE 20   No results for input(s): "AMMONIA" in the last 168 hours. CBC: Recent Labs  Lab 03/10/23 1414 03/11/23 0500 03/12/23 0411  WBC 19.7* 16.8* 13.9*  HGB 12.5 11.2* 11.1*  HCT 35.6* 33.6* 33.0*  MCV 89.9 95.2 94.3  PLT 274 268 313   Cardiac Enzymes: No results for input(s): "CKTOTAL", "CKMB", "CKMBINDEX", "TROPONINI" in the last 168 hours. BNP: BNP (last 3 results) Recent Labs    03/10/23 1925  BNP 327.6*    ProBNP (last 3 results) No results for input(s): "PROBNP" in the last 8760 hours.  CBG: No results for input(s): "GLUCAP" in the last 168 hours.     Signed:  Silvano Bilis MD.  [facility] 03/12/2023, 4:48 PM

## 2023-03-12 NOTE — TOC Initial Note (Addendum)
Transition of Care [facility]) - Initial/Assessment Note    Patient Details  Name: Kylie May MRN: [id number] Date of Birth: 1922/04/22  Transition of Care [facility]) CM/SW Contact:    Darolyn Rua, LCSW Phone Number: 03/12/2023, 2:58 PM  Clinical Narrative:                  CSW spoke with patient's daughter due to patient's hearing deficits, regarding HH recommendations.   She reports they are agreeable to [facility] , prefer who they had in the past which is [facility] for PT OT and aide.   She reports she and family provide 24/7 supervision, no dme needs as patient has walker, wheel chair and 3in1.   [facility] referral sent to [facility] with [facility], they accepted..   Expected Discharge Plan: Home w Home Health Services Barriers to Discharge: Continued Medical Work up   Patient Goals and CMS Choice Patient states their goals for this hospitalization and ongoing recovery are:: to go home CMS Medicare.gov Compare Post Acute Care list provided to:: Patient Represenative (must comment) (daughter) Choice offered to / list presented to : Adult Children      Expected Discharge Plan and Services       Living arrangements for the past 2 months: Single Family Home                                      Prior Living Arrangements/Services Living arrangements for the past 2 months: Single Family Home Lives with:: Adult Children                   Activities of Daily Living   ADL Screening (condition at time of admission) Independently performs ADLs?: No Does the patient have a NEW difficulty with bathing/dressing/toileting/self-feeding that is expected to last >3 days?: No Does the patient have a NEW difficulty with getting in/out of bed, walking, or climbing stairs that is expected to last >3 days?: No Does the patient have a NEW difficulty with communication that is expected to last >3 days?: No Is the patient deaf or have difficulty hearing?: Yes Does the patient have difficulty  seeing, even when wearing glasses/contacts?: No Does the patient have difficulty concentrating, remembering, or making decisions?: No  Permission Sought/Granted                  Emotional Assessment              Admission diagnosis:  Proctocolitis [K52.9] Gastroenteritis [K52.9] Acute respiratory failure with hypoxia (HCC) [J96.01] Sepsis, due to unspecified organism, unspecified whether acute organ dysfunction present [facility]) [A41.9] Patient Active Problem List   Diagnosis Date Noted   C. difficile colitis 03/11/2023   CAP (community acquired pneumonia) 03/11/2023   Cholecystitis 03/11/2023   Proctocolitis 03/10/2023   Chronic kidney disease, stage 3a (HCC) 03/10/2023   Chronic cough 01/31/2023   Tooth abscess 01/31/2023   Leg swelling 10/02/2022   Fatigue 09/05/2022   Neuropathy 07/01/2022   High serum vitamin D 07/01/2022   History of vitamin D deficiency 07/01/2022   Recurrent UTI 06/12/2022   Bronchitis 03/29/2022   Left leg pain 09/10/2021   Leukocytosis 09/10/2021   Yeast infection 06/27/2021   Pressure injury of buttock, stage 1 06/27/2021   Constipation 06/26/2021   Elevated serum creatinine 05/31/2021   Closed fracture of left proximal humerus    Lip laceration    Closed fracture of  phalanx of finger of left hand 05/22/2021   Hyponatremia 05/21/2021   Hypothyroidism 10/05/2019   Allergic rhinitis 09/17/2019   History of cholecystitis 07/16/2019   Skin lesion 07/16/2019   Contusion of left knee 04/07/2019   Sprain of interphalangeal joint of finger 04/07/2019   Closed fracture of distal end of radius 01/06/2019   Elevated LFTs 11/19/2018   Protein-calorie malnutrition (HCC) 09/02/2018   Sepsis (HCC) 08/15/2018   Anemia 07/13/2018   Fall 10/31/2017   Abdominal aortic atherosclerosis (HCC) 09/16/2017   Rash 09/04/2017   Dysphagia 08/18/2017   Chronic back pain 01/14/2017   Scoliosis 10/14/2016   Frequent falls 04/18/2016   Paroxysmal atrial  fibrillation (HCC)    Mitral regurgitation    Chronic diastolic CHF (congestive heart failure) (HCC)    Hearing loss 01/09/2010   Hyperlipidemia 08/10/2007   GERD (gastroesophageal reflux disease) 02/22/2007   PCP:  [name], MD Pharmacy:   [id number] - [place], [place] - [address] AT [facility] OF [address] [facility] [address] & [address] [place] [address] Phone: [phone] Fax: [phone]  [facility] - [place], [place] - [address] [place] [address] Phone: [phone] Fax: [phone]  [facility] - [place], [place] - [address] [place] [address] Phone: [phone] Fax: [phone]     Social Determinants of Health (SDOH) Social History: SDOH Screenings   Food Insecurity: No Food Insecurity (03/11/2023)  Housing: Low Risk  (03/11/2023)  Transportation Needs: No Transportation Needs (03/11/2023)  Utilities: Not At Risk (03/11/2023)  Alcohol Screen: Low Risk  (06/22/2019)  Depression (PHQ2-9): Low Risk  (10/02/2022)  Recent Concern: Depression (PHQ2-9) - Medium Risk (09/04/2022)  Financial Resource Strain: Low Risk  (06/26/2021)  Physical Activity: Unknown (09/04/2022)  Social Connections: Unknown (09/04/2022)  Stress: Patient Declined (09/04/2022)  Tobacco Use: Medium Risk (03/11/2023)   SDOH Interventions:     Readmission Risk Interventions     No data to display

## 2023-03-12 NOTE — Plan of Care (Signed)

## 2023-03-12 NOTE — Care Management Important Message (Signed)
Important Message  Patient Details  Name: Kylie May MRN: [id number] Date of Birth: 1922/05/30   Important Message Given:  N/A - LOS <3 / Initial given by admissions     Olegario Messier A [name] 03/12/2023, 2:50 PM

## 2023-03-12 NOTE — Plan of Care (Signed)

## 2023-03-12 NOTE — Evaluation (Signed)
Physical Therapy Evaluation Patient Details Name: Kylie May MRN: [id number] DOB: 11/29/1921 Today's Date: 03/12/2023  History of Present Illness  Pt is a 89 y.o. female with medical history significant of hyperlipidemia, diastolic CHF, hypothyroidism, depression, CKD-3A, anemia, A-fib on Eliquis, diverticulitis, pancreatitis, esophageal stricture, cholecystitis with biliary drainage in place, who presents to ED on 03/10/23 with nausea, vomiting, diarrhea and abdominal pain.  Clinical Impression   Pt is received in bed with daughter Clydie Braun at bedside, she is agreeable to PT session. Pt's daughter assist with communication due to Pt having severe hearing impairment-daughter uses text to communicate to Pt. At baseline, Pt's daughter reports Pt amb using RW, requires assist from family for ADL/IADLs, and increased weakness. Pt performs bed mobility mod I with increase time, transfers and amb CGA for safety. Pt able to amb approx 20 ft using RW requiring frequent cuing for maintain AD closer to trunk. Pt is slightly impulsive throughout session requires CGA for safety and AD management. Educated Pt and Pt's daughter in maintain AD closer to trunk during amb activities-both verbalized understanding. Pt would benefit from skilled PT to address above deficits and promote optimal return to PLOF.      If plan is discharge home, recommend the following: A little help with walking and/or transfers;A lot of help with bathing/dressing/bathroom;Assist for transportation;Help with stairs or ramp for entrance;Assistance with cooking/housework   Can travel by private vehicle        Equipment Recommendations None recommended by PT  Recommendations for Other Services       Functional Status Assessment Patient has had a recent decline in their functional status and demonstrates the ability to make significant improvements in function in a reasonable and predictable amount of time.     Precautions /  Restrictions Precautions Precautions: Fall Restrictions Weight Bearing Restrictions: No      Mobility  Bed Mobility Overal bed mobility: Needs Assistance Bed Mobility: Supine to Sit, Sit to Supine     Supine to sit: Modified independent (Device/Increase time), HOB elevated, Used rails Sit to supine: Modified independent (Device/Increase time), Used rails   General bed mobility comments: able to perform bed mobility mod I with use of hand rails; increase time required    Transfers Overall transfer level: Needs assistance Equipment used: Rolling walker (2 wheels) Transfers: Sit to/from Stand Sit to Stand: Contact guard assist           General transfer comment: able to perform 2x STS CGA for safety due to Pt slighlty impulsive and communication barrier    Ambulation/Gait Ambulation/Gait assistance: Contact guard assist Gait Distance (Feet): 20 Feet Assistive device: Rolling walker (2 wheels) Gait Pattern/deviations: WFL(Within Functional Limits), Step-through pattern, Decreased stride length, Trunk flexed Gait velocity: slight decreased     General Gait Details: able to amb approx 20 ft using RW; slighlty impulsive requiring facilitation from PT to maintain AD closer to trunk  Stairs            Wheelchair Mobility     Tilt Bed    Modified Rankin (Stroke Patients Only)       Balance Overall balance assessment: Needs assistance Sitting-balance support: Feet supported Sitting balance-Leahy Scale: Good Sitting balance - Comments: able to maintain seated EOB balance during functional activities   Standing balance support: During functional activity, Bilateral upper extremity supported Standing balance-Leahy Scale: Good Standing balance comment: able to maintain static and dynamic standing balance using RW  Pertinent Vitals/Pain Pain Assessment Pain Assessment: No/denies pain    Home Living Family/patient  expects to be discharged to:: Private residence Living Arrangements: Other relatives Available Help at Discharge: Family;Available 24 hours/day Type of Home: House Home Access: Stairs to enter Entrance Stairs-Rails: None Entrance Stairs-Number of Steps: 1 threshold Alternate Level Stairs-Number of Steps: 14 Home Layout: Two level;Able to live on main level with bedroom/bathroom Home Equipment: Rolling Walker (2 wheels);Cane - single point;Wheelchair - manual Additional Comments: Pt uses RW to amb    Prior Function Prior Level of Function : Needs assist       Physical Assist : ADLs (physical)   ADLs (physical): Grooming;Bathing;Dressing;Toileting;IADLs Mobility Comments: uses RW to amb household distance ADLs Comments: Pt requires assist for IADLs (cooking, cleaning) and bathing from family     Extremity/Trunk Assessment   Upper Extremity Assessment Upper Extremity Assessment: Defer to OT evaluation;Generalized weakness    Lower Extremity Assessment Lower Extremity Assessment: Generalized weakness       Communication   Communication Communication: Other (comment) (HOH) Cueing Techniques: Verbal cues;Gestural cues  Cognition Arousal: Alert Behavior During Therapy: WFL for tasks assessed/performed Overall Cognitive Status: Within Functional Limits for tasks assessed                                 General Comments: Pt requires daughter to assist with communication due to being [facility] and does not wear hearing aides        General Comments      Exercises Other Exercises Other Exercises: Edu on benefits of PT for mobility and strengthening   Assessment/Plan    PT Assessment Patient needs continued PT services  PT Problem List Decreased strength;Decreased mobility;Decreased range of motion;Decreased coordination;Decreased activity tolerance;Decreased balance       PT Treatment Interventions DME instruction;Gait training;Functional mobility  training;Therapeutic activities;Therapeutic exercise;Balance training    PT Goals (Current goals can be found in the Care Plan section)  Acute Rehab PT Goals Patient Stated Goal: to go home PT Goal Formulation: With patient/family Time For Goal Achievement: 03/26/23 Potential to Achieve Goals: Good    Frequency Min 1X/week     Co-evaluation               AM-PAC PT "6 Clicks" Mobility  Outcome Measure Help needed turning from your back to your side while in a flat bed without using bedrails?: None Help needed moving from lying on your back to sitting on the side of a flat bed without using bedrails?: None Help needed moving to and from a bed to a chair (including a wheelchair)?: A Little Help needed standing up from a chair using your arms (e.g., wheelchair or bedside chair)?: A Little Help needed to walk in hospital room?: A Little Help needed climbing 3-5 steps with a railing? : A Lot 6 Click Score: 19    End of Session   Activity Tolerance: Patient tolerated treatment well Patient left: in bed;with call bell/phone within reach;with bed alarm set;with family/visitor present Nurse Communication: Mobility status PT Visit Diagnosis: Unsteadiness on feet (R26.81);Muscle weakness (generalized) (M62.81)    Time: 1610-9604 PT Time Calculation (min) (ACUTE ONLY): 19 min   Charges:                 Elmon Else, SPT   Zeba Luby 03/12/2023, 2:31 PM

## 2023-03-12 NOTE — Progress Notes (Signed)
Progress Note   Patient: Kylie May ZOX:[id number] DOB: Jun 21, 1921 DOA: 03/10/2023     2 DOS: the patient was seen and examined on 03/12/2023   Brief hospital course: Taken from H&P.   Kylie May is a 89 y.o. female with medical history significant of hyperlipidemia, diastolic CHF, hypothyroidism, depression, CKD-3A, anemia, A-fib on Eliquis, diverticulitis, pancreatitis, esophageal stricture, cholecystitis with biliary drainage in place, who presents with nausea, vomiting, diarrhea and abdominal pain, for about 5 days. ROS was also positive for dry cough and mild shortness of breath.  No urinary symptoms  On arrival patient had softer blood pressure which improved with 500 cc of normal saline.  She was tested positive for C. difficile antigen and toxin, GI pathogen panel negative.  WBC 19.7, stable renal function and no lactic acidosis. CT abdomen and pelvis with infectious or inflammatory proctocolitis.  Cholelithiasis without superimposed pericholecystic inflammatory change. CXR with left lower lobe airspace opacity which may represent atelectasis versus infection.  Patient was started on p.o. vancomycin.  10/8: Vital stable.  Improving leukocytosis, potassium 3.1, stable bicarb at 20, COVID-19 PCR negative, BNP 327.  Continue to have some diarrhea but abdominal pain improving.  Started tolerating p.o. Procalcitonin 0.15, not much upper respiratory symptoms so less likely any pneumonia, stopping ceftriaxone and Zithromax.    Assessment and Plan: * C. difficile colitis Patient has positive C. difficile antigen and positive toxin. Severe by wbcs Started on p.o. vancomycin-clinically seems improving. This preceded by two rounds oral abx for dental infection. No prior hx of c diff. Abdomen is benign; CT on admission w/o signs of complication -Continue with p.o. vancomycin for 10 days -Continue with supportive care  Cholecystitis Cholecystostomy drain malfunction patient  has an history of cholecystitis s/p chronic drain in place. Drain has now become dislodged - IR consult for replacement  Pulmonary infiltrate on CXR There was some concern of transient desaturation, no respiratory symptoms or distress.  CT chest with no significant infiltrate and barely positive procalcitonin but patient also has C. difficile colitis. -now off ceftriaxone and Zithromax -Preliminary blood cultures negative -Continue to monitor  Sepsis [facility]) Patient met sepsis criteria with leukocytosis, tachypnea.  Borderline hypotension.  Received IV fluid and started on midodrine. Likely secondary to C. difficile colitis. -Management as above  Chronic diastolic CHF (congestive heart failure) (HCC)  2D echo on 08/17/2018 showed EF 60 to 65%.  Patient has elevated BNP of 327, but no leg edema JVD.  Does not seem to have CHF exacerbation. -Hold off diuretics due to sepsis  Paroxysmal atrial fibrillation (HCC) -Continue Eliquis and amiodarone -Holding home metoprolol due to initial hypotension  Hypothyroidism -Continue home Synthroid  Chronic kidney disease, stage 3a (HCC) Seems stable. -Monitor renal function -Avoid nephrotoxins    Subjective: Patient was feeling much improved, no more abdominal pain.  Decreased frequency of diarrhea, less watery. Tolerating diet  Physical Exam: Vitals:   03/11/23 2317 03/12/23 0356 03/12/23 0803 03/12/23 1218  BP: (!) 118/55 (!) 107/52 (!) 131/57 (!) 119/48  Pulse: 72 64 68 71  Resp: 18 18 16    Temp: 97.9 F (36.6 C) (!) 97.5 F (36.4 C) 98.1 F (36.7 C) 98.2 F (36.8 C)  TempSrc:   Oral Oral  SpO2: 94% 95% 95% 97%  Weight:      Height:       General.  Pleasant elderly lady, in no acute distress. Pulmonary.  Lungs clear bilaterally, normal respiratory effort. CV.  Regular rate and rhythm, no JVD,  rub or murmur. Abdomen.  Soft, nontender, nondistended, BS positive. CNS.  Alert and oriented .  No focal neurologic  deficit. Extremities.  No edema, no cyanosis, pulses intact and symmetrical.  Data Reviewed: Prior data reviewed  Family Communication: Discussed with daughter and grandson at bedside.  Disposition: Status is: Inpatient Remains inpatient appropriate because: Severity of illness  Planned Discharge Destination: Home  DVT prophylaxis.  Eliquis Time spent: 35 minutes   Author: Silvano Bilis, MD 03/12/2023 1:48 PM  For on call review [email or web address].

## 2023-03-12 NOTE — Progress Notes (Signed)
Approximately 0928--This RN entered patient's room to complete AM assessment. Upon assessment, pt's Biliary tube found to be dislodged. Dressing still in place. Biliary tube site with small amount of green drainage. This RN cleansed site and covered with gauze for protection.   MD Wouk notified. Pt denies any pain or discomfort at site.

## 2023-03-13 ENCOUNTER — Telehealth: Payer: Self-pay | Admitting: Family Medicine

## 2023-03-13 ENCOUNTER — Telehealth: Payer: Self-pay

## 2023-03-13 ENCOUNTER — Encounter: Payer: Self-pay | Admitting: *Deleted

## 2023-03-13 DIAGNOSIS — A0472 Enterocolitis due to Clostridium difficile, not specified as recurrent: Secondary | ICD-10-CM

## 2023-03-13 NOTE — Telephone Encounter (Signed)
Adelina Mings from [facility] called needing pt to have a referral for PT, OT and home health aid. Pt was discharged from th hospital yesterday. Pt doesn't need an appt just the referral. Please advise and Thank you!

## 2023-03-13 NOTE — Transitions of Care (Post Inpatient/ED Visit) (Signed)
03/13/2023  Name: Kylie May MRN: [id number] DOB: November 21, 1921  Today's TOC FU Call Status: Today's TOC FU Call Status:: Successful TOC FU Call Completed TOC FU Call Complete Date: 03/13/23 Patient's Name and Date of Birth confirmed.  Transition Care Management Follow-up Telephone Call Date of Discharge: 03/12/23 Discharge Facility: [facility]) Type of Discharge: Inpatient Admission Primary Inpatient Discharge Diagnosis:: C diff How have you been since you were released from the hospital?: Same (vomitting after discharge last night.  diarrhea continues) Any questions or concerns?: No  Items Reviewed: Did you receive and understand the discharge instructions provided?: Yes Medications obtained,verified, and reconciled?: Yes (Medications Reviewed) Any new allergies since your discharge?: No Dietary orders reviewed?: Yes Type of Diet Ordered:: general diet Do you have support at home?: Yes People in Home: child(ren), adult Name of Support/Comfort Primary Source: daughters are staying with patient 24/7  Medications Reviewed Today: Medications Reviewed Today   Medications were not reviewed in this encounter     Home Care and Equipment/Supplies: Were Home Health Services Ordered?: No Any new equipment or medical supplies ordered?: No  Functional Questionnaire: Do you need assistance with bathing/showering or dressing?: Yes Do you need assistance with meal preparation?: Yes Do you need assistance with eating?: Yes Do you have difficulty maintaining continence: Yes Do you need assistance with getting out of bed/getting out of a chair/moving?: Yes Do you have difficulty managing or taking your medications?: Yes  Follow up appointments reviewed: PCP Follow-up appointment confirmed?: No (daughter states that she will call MD today) MD Provider Line Number:[phone] Given: No Specialist Hospital Follow-up appointment confirmed?: No Reason  Specialist Follow-Up Not Confirmed: Patient has Specialist Provider Number and will Call for Appointment Do you need transportation to your follow-up appointment?: No Do you understand care options if your condition(s) worsen?: Yes-patient verbalized understanding  SDOH Interventions Today    Flowsheet Row Most Recent Value  SDOH Interventions   Food Insecurity Interventions Intervention Not Indicated  Transportation Interventions Intervention Not Indicated      Daughters reports many many questions about C diff and transmission. Daughters agreed to 30 day TOC program. .      Goals Addressed               This Visit's Progress     Patient will report no readmission to the hospital in the next 30 days. (pt-stated)        Current Barriers:  Understanding c diff and how to keep dog and caregivers safe.  RNCM Clinical Goal(s):  Patient will work with the Care Management team over the next 30 days to address Transition of Care Barriers: understanding of C diff and how to manage at home.  take all medications exactly as prescribed and will call provider for medication related questions as evidenced by patient and family report. attend all scheduled medical appointments: PCP as evidenced by patient and family report  through collaboration with RN Care manager, provider, and care team.   Interventions: Evaluation of current treatment plan related to  self management and patient's adherence to plan as established by provider  Transitions of Care:  New goal. Sick Day Rules Reviewed Doctor Visits  - discussed the importance of doctor visits Reviewed Signs and symptoms of infection Reviewed post discharge instructions. Reviewed C diff and answered questions.  Family with  many questions about how to protect themselves as they take care of patient.  I reviewed that patient is contagious for 48 hours post relief  of symptoms. Reviewed importance of hydration. Family has questions about the  dog and if the dog might get sick because the dog is with patient around the clock including sleeping.  Encouraged family to call MD as I did not know the answer about the dog. Offered to assist with scheduling follow up with MD and daughter states that she will call.  Reviewed 30 day program. Scheduled week 2 TOC call with assigned TOC Nurse.   Patient Goals/Self-Care Activities: Participate in Transition of Care Program/Attend [facility] scheduled calls Notify RN Care Manager of TOC call rescheduling needs Take all medications as prescribed Attend all scheduled provider appointments  Follow Up Plan:  Telephone follow up appointment with care management team member scheduled for:  03/18/2023         Lonia Chimera, RN, BSN, CEN [facility] Community Care Coordinator [phone]  Lonia Chimera, RN, BSN, CEN [employer] [facility] Coordinator [phone]

## 2023-03-14 NOTE — Telephone Encounter (Signed)
Spoke to Clydie Braun and she understands and is agreeable.

## 2023-03-14 NOTE — Telephone Encounter (Signed)
Spoke to Patient's daughter Clydie Braun and scheduled a hospital follow up for 03/25/23. Clydie Braun would like to know if there are any precautions like changing the bed sheets daily or washing the clothes in hot water that they need to do for the C-diff.

## 2023-03-14 NOTE — Telephone Encounter (Signed)
Her family should wear gloves when dealing with the patient or any of her clothes.  They need to wash her hands in hot water with soap after dealing with her or her clothes.  They should cleanse any surface that she touches with a bleach and water mixture with 1 part bleach to 9 parts water.  This should wipe the area that she has touched with this solution and let it stand for 5 minutes.

## 2023-03-14 NOTE — Telephone Encounter (Signed)
Order placed

## 2023-03-15 LAB — CULTURE, BLOOD (ROUTINE X 2)
Culture: NO GROWTH
Culture: NO GROWTH
Special Requests: ADEQUATE
Special Requests: ADEQUATE

## 2023-03-18 DIAGNOSIS — E039 Hypothyroidism, unspecified: Secondary | ICD-10-CM | POA: Diagnosis not present

## 2023-03-18 DIAGNOSIS — F32A Depression, unspecified: Secondary | ICD-10-CM | POA: Diagnosis not present

## 2023-03-18 DIAGNOSIS — D631 Anemia in chronic kidney disease: Secondary | ICD-10-CM | POA: Diagnosis not present

## 2023-03-18 DIAGNOSIS — E785 Hyperlipidemia, unspecified: Secondary | ICD-10-CM | POA: Diagnosis not present

## 2023-03-18 DIAGNOSIS — K5790 Diverticulosis of intestine, part unspecified, without perforation or abscess without bleeding: Secondary | ICD-10-CM | POA: Diagnosis not present

## 2023-03-18 DIAGNOSIS — Z87891 Personal history of nicotine dependence: Secondary | ICD-10-CM | POA: Diagnosis not present

## 2023-03-18 DIAGNOSIS — I480 Paroxysmal atrial fibrillation: Secondary | ICD-10-CM | POA: Diagnosis not present

## 2023-03-18 DIAGNOSIS — N1831 Chronic kidney disease, stage 3a: Secondary | ICD-10-CM | POA: Diagnosis not present

## 2023-03-18 DIAGNOSIS — I5032 Chronic diastolic (congestive) heart failure: Secondary | ICD-10-CM | POA: Diagnosis not present

## 2023-03-18 DIAGNOSIS — K222 Esophageal obstruction: Secondary | ICD-10-CM | POA: Diagnosis not present

## 2023-03-18 DIAGNOSIS — K859 Acute pancreatitis without necrosis or infection, unspecified: Secondary | ICD-10-CM | POA: Diagnosis not present

## 2023-03-18 DIAGNOSIS — J189 Pneumonia, unspecified organism: Secondary | ICD-10-CM | POA: Diagnosis not present

## 2023-03-18 DIAGNOSIS — K810 Acute cholecystitis: Secondary | ICD-10-CM | POA: Diagnosis not present

## 2023-03-18 DIAGNOSIS — A0472 Enterocolitis due to Clostridium difficile, not specified as recurrent: Secondary | ICD-10-CM | POA: Diagnosis not present

## 2023-03-21 DIAGNOSIS — K810 Acute cholecystitis: Secondary | ICD-10-CM | POA: Diagnosis not present

## 2023-03-21 DIAGNOSIS — J189 Pneumonia, unspecified organism: Secondary | ICD-10-CM | POA: Diagnosis not present

## 2023-03-21 DIAGNOSIS — I480 Paroxysmal atrial fibrillation: Secondary | ICD-10-CM | POA: Diagnosis not present

## 2023-03-21 DIAGNOSIS — I5032 Chronic diastolic (congestive) heart failure: Secondary | ICD-10-CM | POA: Diagnosis not present

## 2023-03-21 DIAGNOSIS — N1831 Chronic kidney disease, stage 3a: Secondary | ICD-10-CM | POA: Diagnosis not present

## 2023-03-21 DIAGNOSIS — A0472 Enterocolitis due to Clostridium difficile, not specified as recurrent: Secondary | ICD-10-CM | POA: Diagnosis not present

## 2023-03-25 ENCOUNTER — Ambulatory Visit: Payer: Medicare Other

## 2023-03-25 ENCOUNTER — Ambulatory Visit: Payer: Medicare Other | Admitting: Family Medicine

## 2023-03-25 ENCOUNTER — Ambulatory Visit
Admission: RE | Admit: 2023-03-25 | Discharge: 2023-03-25 | Disposition: A | Discharge status: Home / Self Care | Payer: Medicare Other | Source: Ambulatory Visit | Attending: Interventional Radiology | Admitting: Radiology

## 2023-03-25 ENCOUNTER — Encounter: Payer: Self-pay | Admitting: Family Medicine

## 2023-03-25 VITALS — BP 112/70 | HR 75 | Temp 97.7°F | Ht 59.0 in | Wt 108.8 lb

## 2023-03-25 DIAGNOSIS — A0472 Enterocolitis due to Clostridium difficile, not specified as recurrent: Secondary | ICD-10-CM | POA: Diagnosis not present

## 2023-03-25 DIAGNOSIS — K810 Acute cholecystitis: Secondary | ICD-10-CM | POA: Insufficient documentation

## 2023-03-25 DIAGNOSIS — R059 Cough, unspecified: Secondary | ICD-10-CM | POA: Diagnosis not present

## 2023-03-25 DIAGNOSIS — I5032 Chronic diastolic (congestive) heart failure: Secondary | ICD-10-CM | POA: Diagnosis not present

## 2023-03-25 DIAGNOSIS — K449 Diaphragmatic hernia without obstruction or gangrene: Secondary | ICD-10-CM | POA: Diagnosis not present

## 2023-03-25 DIAGNOSIS — I480 Paroxysmal atrial fibrillation: Secondary | ICD-10-CM | POA: Diagnosis not present

## 2023-03-25 DIAGNOSIS — J90 Pleural effusion, not elsewhere classified: Secondary | ICD-10-CM | POA: Diagnosis not present

## 2023-03-25 DIAGNOSIS — N1831 Chronic kidney disease, stage 3a: Secondary | ICD-10-CM | POA: Diagnosis not present

## 2023-03-25 DIAGNOSIS — J9811 Atelectasis: Secondary | ICD-10-CM | POA: Diagnosis not present

## 2023-03-25 DIAGNOSIS — Z434 Encounter for attention to other artificial openings of digestive tract: Secondary | ICD-10-CM | POA: Insufficient documentation

## 2023-03-25 DIAGNOSIS — T85520A Displacement of bile duct prosthesis, initial encounter: Secondary | ICD-10-CM | POA: Diagnosis not present

## 2023-03-25 DIAGNOSIS — R918 Other nonspecific abnormal finding of lung field: Secondary | ICD-10-CM | POA: Diagnosis not present

## 2023-03-25 DIAGNOSIS — J189 Pneumonia, unspecified organism: Secondary | ICD-10-CM | POA: Diagnosis not present

## 2023-03-25 HISTORY — PX: IR EXCHANGE BILIARY DRAIN: IMG6046

## 2023-03-25 LAB — COMPREHENSIVE METABOLIC PANEL
ALT: 18 U/L (ref 0–35)
AST: 21 U/L (ref 0–37)
Albumin: 3.8 g/dL (ref 3.5–5.2)
Alkaline Phosphatase: 44 U/L (ref 39–117)
BUN: 17 mg/dL (ref 6–23)
CO2: 28 meq/L (ref 19–32)
Calcium: 9.3 mg/dL (ref 8.4–10.5)
Chloride: 99 meq/L (ref 96–112)
Creatinine, Ser: 0.94 mg/dL (ref 0.40–1.20)
GFR: 50.19 mL/min — ABNORMAL LOW (ref >60.00)
Glucose, Bld: 109 mg/dL — ABNORMAL HIGH (ref 70–99)
Potassium: 4.5 meq/L (ref 3.5–5.1)
Sodium: 135 meq/L (ref 135–145)
Total Bilirubin: 0.4 mg/dL (ref 0.2–1.2)
Total Protein: 7 g/dL (ref 6.0–8.3)

## 2023-03-25 LAB — CBC WITH DIFFERENTIAL/PLATELET
Basophils Absolute: 0.1 10*3/uL (ref 0.0–0.1)
Basophils Relative: 1.3 % (ref 0.0–3.0)
Eosinophils Absolute: 0.1 10*3/uL (ref 0.0–0.7)
Eosinophils Relative: 1.4 % (ref 0.0–5.0)
HCT: 40.3 % (ref 36.0–46.0)
Hemoglobin: 13.1 g/dL (ref 12.0–15.0)
Lymphocytes Relative: 27.6 % (ref 12.0–46.0)
Lymphs Abs: 2.3 10*3/uL (ref 0.7–4.0)
MCHC: 32.5 g/dL (ref 30.0–36.0)
MCV: 96.5 fL (ref 78.0–100.0)
Monocytes Absolute: 0.8 10*3/uL (ref 0.1–1.0)
Monocytes Relative: 9.5 % (ref 3.0–12.0)
Neutro Abs: 5 10*3/uL (ref 1.4–7.7)
Neutrophils Relative %: 60.2 % (ref 43.0–77.0)
Platelets: 448 10*3/uL — ABNORMAL HIGH (ref 150.0–400.0)
RBC: 4.18 Mil/uL (ref 3.87–5.11)
RDW: 13.9 % (ref 11.5–15.5)
WBC: 8.3 10*3/uL (ref 4.0–10.5)

## 2023-03-25 MED ORDER — LIDOCAINE HCL 1 % IJ SOLN
INTRAMUSCULAR | Status: AC
  Filled 2023-03-25: qty 20

## 2023-03-25 MED ORDER — LIDOCAINE HCL 1 % IJ SOLN: 10.0000 mL | Freq: Once | INTRAMUSCULAR | Status: DC

## 2023-03-25 MED ORDER — IOHEXOL 300 MG/ML  SOLN
50.0000 mL | Freq: Once | INTRAMUSCULAR | Status: AC | PRN
  Administered 2023-03-25: 10 mL

## 2023-03-26 DIAGNOSIS — L981 Factitial dermatitis: Secondary | ICD-10-CM | POA: Diagnosis not present

## 2023-03-26 DIAGNOSIS — D225 Melanocytic nevi of trunk: Secondary | ICD-10-CM | POA: Diagnosis not present

## 2023-03-26 DIAGNOSIS — K449 Diaphragmatic hernia without obstruction or gangrene: Secondary | ICD-10-CM | POA: Insufficient documentation

## 2023-03-26 DIAGNOSIS — D2261 Melanocytic nevi of right upper limb, including shoulder: Secondary | ICD-10-CM | POA: Diagnosis not present

## 2023-03-26 DIAGNOSIS — J90 Pleural effusion, not elsewhere classified: Secondary | ICD-10-CM | POA: Insufficient documentation

## 2023-03-26 DIAGNOSIS — D2262 Melanocytic nevi of left upper limb, including shoulder: Secondary | ICD-10-CM | POA: Diagnosis not present

## 2023-03-26 DIAGNOSIS — D2272 Melanocytic nevi of left lower limb, including hip: Secondary | ICD-10-CM | POA: Diagnosis not present

## 2023-03-26 DIAGNOSIS — L821 Other seborrheic keratosis: Secondary | ICD-10-CM | POA: Diagnosis not present

## 2023-03-26 DIAGNOSIS — C44519 Basal cell carcinoma of skin of other part of trunk: Secondary | ICD-10-CM | POA: Diagnosis not present

## 2023-03-26 DIAGNOSIS — D2271 Melanocytic nevi of right lower limb, including hip: Secondary | ICD-10-CM | POA: Diagnosis not present

## 2023-03-26 NOTE — Assessment & Plan Note (Signed)
Suspect this is the cause of her chest aching.  Advised monitoring for now.

## 2023-03-26 NOTE — Assessment & Plan Note (Signed)
Symptoms have resolved.  They will monitor for any recurrent symptoms and contact us immediately if those occur.  Advised to seek medical attention if she becomes severely ill.

## 2023-03-26 NOTE — Assessment & Plan Note (Signed)
Trace pleural effusion on CT imaging.  Will get a chest x-ray given that she has had some cough recently.  Discussed that this is small enough that I would not recommend to her have it drained or tested unless it gets bigger.  If resolved on chest x-ray we will monitor for any progressive or recurrent symptoms.

## 2023-03-26 NOTE — Progress Notes (Signed)
Marikay Alar, MD Phone: [phone]  Kylie May is a 89 y.o. female who presents today for follow-up.  Patient's daughter provides much of the history.  Patient is deaf and communicates by text on her phone.  C. difficile colitis: Patient was hospitalized with C. difficile.  She had significant diarrhea and abdominal pain for 5 days leading into the hospitalization.  She had some vomiting as well.  She was found to have C. difficile and elevated white blood cell count.  She was started on oral vancomycin and her diarrhea improved.  She completed a 10-day course of oral vancomycin.  She notes no abdominal pain, blood in her stool, nausea, or vomiting at this time.  No fevers.  The diarrhea has resolved.  Chest ache: Patient notes lower chest aching at times when she is sitting around.  It occurs at the bottom of her sternum.  She takes Tylenol and this improves.  No radiation.  No shortness of breath.  She does have a known hiatal hernia that is fairly significant.  Pleural effusion: Noted on recent CT scan.  This was trace in volume.  She has had some mild cough recently.  No worrisome nodules were noted.  Social History   Tobacco Use  Smoking Status Former   Current packs/day: 0.00   Types: Cigarettes   Quit date: 06/04/1951   Years since quitting: 71.8   Passive exposure: Past  Smokeless Tobacco Never  Tobacco Comments   Smoked 1945-1953 , up to 2 cigarettes/day    Current Outpatient Medications on File Prior to Visit  Medication Sig Dispense Refill   acetaminophen (TYLENOL) 325 MG tablet Take 2 tablets (650 mg total) by mouth every 6 (six) hours as needed for mild pain (or Fever >/= 101).     amiodarone (PACERONE) 200 MG tablet Take 1 tablet (200 mg total) by mouth daily. 90 tablet 3   apixaban (ELIQUIS) 2.5 MG TABS tablet Take 1 tablet (2.5 mg total) by mouth 2 (two) times daily. 180 tablet 3   azelastine (ASTELIN) 0.1 % nasal spray Place 2 sprays into both nostrils 2  (two) times daily. Use in each nostril as directed 30 mL 12   CRANBERRY PO Take 2 tablets by mouth daily.     famotidine (PEPCID) 20 MG tablet Take 20 mg by mouth at bedtime.     ferrous sulfate 325 (65 FE) MG EC tablet Take 325 mg by mouth daily.     fluticasone (FLONASE) 50 MCG/ACT nasal spray Place 2 sprays into both nostrils daily. 16 g 11   hydrocortisone (ANUSOL-HC) 25 MG suppository Place 1 suppository (25 mg total) rectally 2 (two) times daily as needed for hemorrhoids or anal itching. 14 suppository 1   levothyroxine (SYNTHROID) 50 MCG tablet Take 1 tablet (50 mcg total) by mouth daily. 90 tablet 3   magnesium chloride (SLOW-MAG) 64 MG TBEC SR tablet Take 2 tablets (128 mg total) by mouth daily. 60 tablet 3   montelukast (SINGULAIR) 10 MG tablet Take 1 tablet (10 mg total) by mouth at bedtime. 30 tablet 3   nystatin cream (MYCOSTATIN)      omeprazole (PRILOSEC) 40 MG capsule TAKE ONE CAPSULE BY MOUTH EVERY MORNING AND 1 AT BEDTIME AS DIRECTED 180 capsule 1   ondansetron (ZOFRAN) 4 MG tablet Take 1 tablet (4 mg total) by mouth daily as needed for nausea or vomiting. 30 tablet 1   Probiotic Product (PROBIOTIC PO) Take 1 capsule by mouth 2 (two) times daily.  Sodium Chloride Flush (NORMAL SALINE FLUSH) 0.9 % SOLN FLUSH ONCE DAILY AS DIRECTED 3215 mL 3   No current facility-administered medications on file prior to visit.     ROS see history of present illness  Objective  Physical Exam Vitals:   03/25/23 1103  BP: 112/70  Pulse: 75  Temp: 97.7 F (36.5 C)  SpO2: 98%    BP Readings from Last 3 Encounters:  03/25/23 112/70  03/12/23 (!) 151/62  01/30/23 112/62   Wt Readings from Last 3 Encounters:  03/25/23 108 lb 12.8 oz (49.4 kg)  03/10/23 112 lb (50.8 kg)  01/30/23 112 lb 3.2 oz (50.9 kg)    Physical Exam Constitutional:      General: She is not in acute distress.    Appearance: She is not diaphoretic.  Cardiovascular:     Rate and Rhythm: Normal rate and  regular rhythm.     Heart sounds: Normal heart sounds.  Pulmonary:     Effort: Pulmonary effort is normal.     Breath sounds: Normal breath sounds.  Abdominal:     General: Bowel sounds are normal. There is no distension.     Palpations: Abdomen is soft.     Tenderness: There is no abdominal tenderness.  Skin:    General: Skin is warm and dry.  Neurological:     Mental Status: She is alert.      Assessment/Plan: Please see individual problem list.  Pleural effusion Assessment & Plan: Trace pleural effusion on CT imaging.  Will get a chest x-ray given that she has had some cough recently.  Discussed that this is small enough that I would not recommend to her have it drained or tested unless it gets bigger.  If resolved on chest x-ray we will monitor for any progressive or recurrent symptoms.  Orders: -     DG Chest 2 View; Future  C. difficile colitis Assessment & Plan: Symptoms have resolved.  They will monitor for any recurrent symptoms and contact us immediately if those occur.  Advised to seek medical attention if she becomes severely ill.  Orders: -     Comprehensive metabolic panel -     CBC with Differential/Platelet  Hiatal hernia Assessment & Plan: Suspect this is the cause of her chest aching.  Advised monitoring for now.      Return in about 3 months (around 06/25/2023) for transfer of care with [facility].  I have spent 35 minutes in the care of this patient regarding history taking, completion of exam, discussion of plan, extensive time taken communicating with patient given that she is deaf.Marikay Alar, MD [facility]

## 2023-03-27 DIAGNOSIS — I5032 Chronic diastolic (congestive) heart failure: Secondary | ICD-10-CM | POA: Diagnosis not present

## 2023-03-27 DIAGNOSIS — J189 Pneumonia, unspecified organism: Secondary | ICD-10-CM | POA: Diagnosis not present

## 2023-03-27 DIAGNOSIS — I480 Paroxysmal atrial fibrillation: Secondary | ICD-10-CM | POA: Diagnosis not present

## 2023-03-27 DIAGNOSIS — K810 Acute cholecystitis: Secondary | ICD-10-CM | POA: Diagnosis not present

## 2023-03-27 DIAGNOSIS — A0472 Enterocolitis due to Clostridium difficile, not specified as recurrent: Secondary | ICD-10-CM | POA: Diagnosis not present

## 2023-03-27 DIAGNOSIS — N1831 Chronic kidney disease, stage 3a: Secondary | ICD-10-CM | POA: Diagnosis not present

## 2023-04-01 ENCOUNTER — Telehealth: Payer: Self-pay | Admitting: Family Medicine

## 2023-04-01 DIAGNOSIS — A0472 Enterocolitis due to Clostridium difficile, not specified as recurrent: Secondary | ICD-10-CM | POA: Diagnosis not present

## 2023-04-01 DIAGNOSIS — N1831 Chronic kidney disease, stage 3a: Secondary | ICD-10-CM | POA: Diagnosis not present

## 2023-04-01 DIAGNOSIS — I480 Paroxysmal atrial fibrillation: Secondary | ICD-10-CM | POA: Diagnosis not present

## 2023-04-01 DIAGNOSIS — J189 Pneumonia, unspecified organism: Secondary | ICD-10-CM | POA: Diagnosis not present

## 2023-04-01 DIAGNOSIS — K810 Acute cholecystitis: Secondary | ICD-10-CM | POA: Diagnosis not present

## 2023-04-01 DIAGNOSIS — I5032 Chronic diastolic (congestive) heart failure: Secondary | ICD-10-CM | POA: Diagnosis not present

## 2023-04-01 NOTE — Telephone Encounter (Signed)
Pt daughter would like to be called concerting the pt

## 2023-04-02 NOTE — Telephone Encounter (Signed)
Patient's daughter Steward Drone called and states on 02/25/23 Patient was doing OT with a therapist at home and the right side of her body popped. Since then the Patient has complained of right side back and right side chest pain when taking a deep breath. Home health, the therapists and family believe she pulled a muscle so they have been treating her with Tylenol and something similar to [place]. Steward Drone states Patient does seem to be doing better but wanted to schedule an appointment for 04/04/23 in case they need it. Patient is scheduled to see Charan on 04/04/23 at 1:20.

## 2023-04-03 NOTE — Telephone Encounter (Signed)
Noted  

## 2023-04-04 ENCOUNTER — Encounter: Payer: Self-pay | Admitting: Nurse Practitioner

## 2023-04-04 ENCOUNTER — Ambulatory Visit (INDEPENDENT_AMBULATORY_CARE_PROVIDER_SITE_OTHER): Payer: Medicare Other

## 2023-04-04 ENCOUNTER — Ambulatory Visit (INDEPENDENT_AMBULATORY_CARE_PROVIDER_SITE_OTHER): Payer: Medicare Other | Admitting: Nurse Practitioner

## 2023-04-04 VITALS — BP 112/70 | HR 84 | Temp 98.0°F | Ht 59.0 in | Wt 105.8 lb

## 2023-04-04 DIAGNOSIS — K810 Acute cholecystitis: Secondary | ICD-10-CM | POA: Diagnosis not present

## 2023-04-04 DIAGNOSIS — N1831 Chronic kidney disease, stage 3a: Secondary | ICD-10-CM | POA: Diagnosis not present

## 2023-04-04 DIAGNOSIS — R0781 Pleurodynia: Secondary | ICD-10-CM

## 2023-04-04 DIAGNOSIS — A0472 Enterocolitis due to Clostridium difficile, not specified as recurrent: Secondary | ICD-10-CM | POA: Diagnosis not present

## 2023-04-04 DIAGNOSIS — R0989 Other specified symptoms and signs involving the circulatory and respiratory systems: Secondary | ICD-10-CM | POA: Diagnosis not present

## 2023-04-04 DIAGNOSIS — Z23 Encounter for immunization: Secondary | ICD-10-CM

## 2023-04-04 DIAGNOSIS — I480 Paroxysmal atrial fibrillation: Secondary | ICD-10-CM | POA: Diagnosis not present

## 2023-04-04 DIAGNOSIS — J189 Pneumonia, unspecified organism: Secondary | ICD-10-CM | POA: Diagnosis not present

## 2023-04-04 DIAGNOSIS — I5032 Chronic diastolic (congestive) heart failure: Secondary | ICD-10-CM | POA: Diagnosis not present

## 2023-04-04 DIAGNOSIS — J9811 Atelectasis: Secondary | ICD-10-CM | POA: Diagnosis not present

## 2023-04-04 DIAGNOSIS — I517 Cardiomegaly: Secondary | ICD-10-CM | POA: Diagnosis not present

## 2023-04-04 NOTE — Progress Notes (Signed)
Established Patient Office Visit  Subjective:  Patient ID: Kylie May, female    DOB: [birth year]  Age: 89 y.o. MRN: [id number]  CC:  Chief Complaint  Patient presents with   Back Pain    Chest pain    HPI  Kylie May presents for pain in the right side of rib after doing the physical therapy last week.  She has as been using arthritic cream, tylenol, heat and cold pack with some improvement.  Pt daughter states that she has been doing better compared to the last week.   She was treated with 10-day course of oral vancomycin due to C. diff recent CT scan showed trace volume of pleural effusion.  The daughter would like to perform imaging to rule out rib fracture.  Patient has history of scoliosis and chronic back pain.  HPI   Past Medical History:  Diagnosis Date   A-fib (HCC)    Acid reflux disease    Arrhythmia    Atrial fibrillation with RVR (HCC) 08/15/2018   Chronic diastolic CHF (congestive heart failure) (HCC)    Compression fracture of lumbar vertebra (HCC)    Diverticulitis 01-2008   [facility] [place]   Diverticulosis    Esophageal stricture 2006   Femoral bruit    Right   Fibrocystic breast disease    Hiatal hernia    History of esophageal stricture 06/14/2004   2006 esophageal dilation Schatzki's ring    History of skin cancer 12/29/2008   Basal cell cancer right glabella 07/10/2010 Dr. Adolphus Birchwood, [facility] [place] , [place]    Hypercholesterolemia    [place] study LDL goal = < 160   Mitral regurgitation    a. 06/2014 EF 55-60%, elevated end-diastolic pressures, dilated LA at 4.3 cm, mildly dilated RA, severe mitral regurgitation, mild aortic sclerosis without stenosis, mod-severe TR   Mitral valve prolapse    Osteoporosis    Dr Marcelle Overlie   Pancreatitis 07-2008   Hospitalized    Patella fracture 05/13/2018   Schatzki's ring    Sepsis (HCC) 08/15/2018   Skin cancer    facial x 2. Dr Adolphus Birchwood    Past Surgical History:  Procedure Laterality Date   BILIARY  STENT PLACEMENT  08/21/2018   Procedure: BILIARY STENT PLACEMENT;  Surgeon: Kerin Salen, MD;  Location: [facility] ENDOSCOPY;  Service: Gastroenterology;;   COLONOSCOPY  2006   Epidural steroids     x1   ERCP N/A 08/21/2018   Procedure: ENDOSCOPIC RETROGRADE CHOLANGIOPANCREATOGRAPHY (ERCP);  Surgeon: Kerin Salen, MD;  Location: [facility] ENDOSCOPY;  Service: Gastroenterology;  Laterality: N/A;   ESOPHAGEAL DILATION  2006   ESOPHAGOGASTRODUODENOSCOPY (EGD) WITH PROPOFOL N/A 11/16/2018   Procedure: ESOPHAGOGASTRODUODENOSCOPY (EGD) WITH PROPOFOL;  Surgeon: Toney Reil, MD;  Location: [facility] ENDOSCOPY;  Service: Gastroenterology;  Laterality: N/A;   G3 P4 (twins)     IR CHOLANGIOGRAM EXISTING TUBE  08/18/2018   IR CHOLANGIOGRAM EXISTING TUBE  07/16/2022   IR EXCHANGE BILIARY DRAIN  11/03/2018   IR EXCHANGE BILIARY DRAIN  12/01/2018   IR EXCHANGE BILIARY DRAIN  05/13/2019   IR EXCHANGE BILIARY DRAIN  06/14/2019   IR EXCHANGE BILIARY DRAIN  07/19/2019   IR EXCHANGE BILIARY DRAIN  08/17/2019   IR EXCHANGE BILIARY DRAIN  09/30/2019   IR EXCHANGE BILIARY DRAIN  12/08/2019   IR EXCHANGE BILIARY DRAIN  01/19/2020   IR EXCHANGE BILIARY DRAIN  03/01/2020   IR EXCHANGE BILIARY DRAIN  04/20/2020   IR EXCHANGE BILIARY DRAIN  07/06/2020  IR EXCHANGE BILIARY DRAIN  10/26/2020   IR EXCHANGE BILIARY DRAIN  01/18/2021   IR EXCHANGE BILIARY DRAIN  03/05/2021   IR EXCHANGE BILIARY DRAIN  05/25/2021   IR EXCHANGE BILIARY DRAIN  08/03/2021   IR EXCHANGE BILIARY DRAIN  09/28/2021   IR EXCHANGE BILIARY DRAIN  11/23/2021   IR EXCHANGE BILIARY DRAIN  12/27/2021   IR EXCHANGE BILIARY DRAIN  02/20/2022   IR EXCHANGE BILIARY DRAIN  04/17/2022   IR EXCHANGE BILIARY DRAIN  06/17/2022   IR EXCHANGE BILIARY DRAIN  07/12/2022   IR EXCHANGE BILIARY DRAIN  08/12/2022   IR EXCHANGE BILIARY DRAIN  09/20/2022   IR EXCHANGE BILIARY DRAIN  11/01/2022   IR EXCHANGE BILIARY DRAIN  12/31/2022   IR EXCHANGE BILIARY DRAIN  02/25/2023   IR EXCHANGE BILIARY DRAIN   03/12/2023   IR EXCHANGE BILIARY DRAIN  03/25/2023   IR PERC CHOLECYSTOSTOMY  08/16/2018   IR RADIOLOGIST EVAL & MGMT  11/08/2021   IR RADIOLOGIST EVAL & MGMT  01/10/2023   REMOVAL OF STONES  08/21/2018   Procedure: REMOVAL OF STONES;  Surgeon: Kerin Salen, MD;  Location: [facility] ENDOSCOPY;  Service: Gastroenterology;;   SKIN CANCER EXCISION     SPHINCTEROTOMY  08/21/2018   Procedure: Dennison Mascot;  Surgeon: Kerin Salen, MD;  Location: [facility] ENDOSCOPY;  Service: Gastroenterology;;    Family History  Problem Relation Age of Onset   Breast cancer Maternal Aunt    Hepatitis Daughter        C   Lung disease Mother        lung tumor   Heart attack Daughter 32       S/P stents   Liver cancer Daughter    Heart attack Sister 70   Diabetes Neg Hx    Stroke Neg Hx     Social History   Socioeconomic History   Marital status: Widowed    Spouse name: Not on file   Number of children: 3   Years of education: Not on file   Highest education level: Bachelor's degree (e.g., BA, AB, BS)  Occupational History   Occupation: Manufacturing engineer --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  Tobacco Use   Smoking status: Former    Current packs/day: 0.00    Types: Cigarettes    Quit date: 06/04/1951    Years since quitting: 71.9    Passive exposure: Past   Smokeless tobacco: Never   Tobacco comments:    Smoked 1945-1953 , up to 2 cigarettes/day  Vaping Use   Vaping status: Never Used  Substance and Sexual Activity   Alcohol use: Yes    Alcohol/week: 1.0 standard drink of alcohol    Types: 1 Glasses of wine per week    Comment: occassional   Drug use: No   Sexual activity: Not Currently  Other Topics Concern   Not on file  Social History Narrative   Has living will    Would want daughter Clydie Braun to be health care POA   Not sure about DNR--definitely doesn't want prolonged life support   Not sure about tube feeds either (but probably not)         Social Determinants of Health    Financial Resource Strain: Low Risk  (03/24/2023)   Overall Financial Resource Strain (CARDIA)    Difficulty of Paying Living Expenses: Not hard at all  Food Insecurity: No Food Insecurity (03/24/2023)   Hunger Vital Sign    Worried About Running Out of Food  in the Last Year: Never true    Ran Out of Food in the Last Year: Never true  Transportation Needs: No Transportation Needs (03/24/2023)   PRAPARE - Administrator, Civil Service (Medical): No    Lack of Transportation (Non-Medical): No  Physical Activity: Unknown (03/24/2023)   Exercise Vital Sign    Days of Exercise per Week: Patient declined    Minutes of Exercise per Session: Not on file  Stress: No Stress Concern Present (03/24/2023)   [employer] of Occupational Health - Occupational Stress Questionnaire    Feeling of Stress : Only a little  Social Connections: Unknown (03/24/2023)   Social Connection and Isolation Panel [NHANES]    Frequency of Communication with Friends and Family: More than three times a week    Frequency of Social Gatherings with Friends and Family: More than three times a week    Attends Religious Services: Patient declined    Database administrator or Organizations: Patient declined    Attends Banker Meetings: Not on file    Marital Status: Widowed  Intimate Partner Violence: Not At Risk (03/11/2023)   Humiliation, Afraid, Rape, and Kick questionnaire    Fear of Current or Ex-Partner: No    Emotionally Abused: No    Physically Abused: No    Sexually Abused: No     Outpatient Medications Prior to Visit  Medication Sig Dispense Refill   acetaminophen (TYLENOL) 325 MG tablet Take 2 tablets (650 mg total) by mouth every 6 (six) hours as needed for mild pain (or Fever >/= 101).     amiodarone (PACERONE) 200 MG tablet Take 1 tablet (200 mg total) by mouth daily. 90 tablet 3   apixaban (ELIQUIS) 2.5 MG TABS tablet Take 1 tablet (2.5 mg total) by mouth 2 (two) times  daily. 180 tablet 3   azelastine (ASTELIN) 0.1 % nasal spray Place 2 sprays into both nostrils 2 (two) times daily. Use in each nostril as directed 30 mL 12   CRANBERRY PO Take 2 tablets by mouth daily.     famotidine (PEPCID) 20 MG tablet Take 20 mg by mouth at bedtime.     ferrous sulfate 325 (65 FE) MG EC tablet Take 325 mg by mouth daily.     fluticasone (FLONASE) 50 MCG/ACT nasal spray Place 2 sprays into both nostrils daily. 16 g 11   hydrocortisone (ANUSOL-HC) 25 MG suppository Place 1 suppository (25 mg total) rectally 2 (two) times daily as needed for hemorrhoids or anal itching. 14 suppository 1   levothyroxine (SYNTHROID) 50 MCG tablet Take 1 tablet (50 mcg total) by mouth daily. 90 tablet 3   magnesium chloride (SLOW-MAG) 64 MG TBEC SR tablet Take 2 tablets (128 mg total) by mouth daily. 60 tablet 3   montelukast (SINGULAIR) 10 MG tablet Take 1 tablet (10 mg total) by mouth at bedtime. 30 tablet 3   nystatin cream (MYCOSTATIN)      omeprazole (PRILOSEC) 40 MG capsule TAKE ONE CAPSULE BY MOUTH EVERY MORNING AND 1 AT BEDTIME AS DIRECTED 180 capsule 1   ondansetron (ZOFRAN) 4 MG tablet Take 1 tablet (4 mg total) by mouth daily as needed for nausea or vomiting. 30 tablet 1   Probiotic Product (PROBIOTIC PO) Take 1 capsule by mouth 2 (two) times daily.     Sodium Chloride Flush (NORMAL SALINE FLUSH) 0.9 % SOLN FLUSH ONCE DAILY AS DIRECTED 3215 mL 3   No facility-administered medications prior to visit.  Allergies  Allergen Reactions   Metronidazole     Other reaction(s): Dizziness and giddiness (finding), Other (qualifier value) made things feel like they were vibrating like the bed and floor,also caused mild dizziness weakness   Penicillin G Rash    ROS Review of Systems Negative unless indicated in HPI.    Objective:    Physical Exam Constitutional:      Appearance: Normal appearance.  HENT:     Mouth/Throat:     Mouth: Mucous membranes are moist.  Eyes:      Conjunctiva/sclera: Conjunctivae normal.     Pupils: Pupils are equal, round, and reactive to light.  Cardiovascular:     Rate and Rhythm: Normal rate and regular rhythm.     Pulses: Normal pulses.     Heart sounds: Normal heart sounds.  Pulmonary:     Effort: Pulmonary effort is normal.     Breath sounds: Normal breath sounds.  Musculoskeletal:        General: Tenderness (Right-sided rib pain) present.     Cervical back: Normal range of motion. No tenderness.  Skin:    General: Skin is warm.     Findings: No bruising.  Neurological:     General: No focal deficit present.     Mental Status: She is alert and oriented to person, place, and time. Mental status is at baseline.  Psychiatric:        Mood and Affect: Mood normal.        Behavior: Behavior normal.        Thought Content: Thought content normal.        Judgment: Judgment normal.     BP 112/70   Pulse 84   Temp 98 F (36.7 C) (Oral)   Ht 4\' 11"  (1.499 m)   Wt 105 lb 12.8 oz (48 kg)   SpO2 97%   BMI 21.37 kg/m  Wt Readings from Last 3 Encounters:  04/04/23 105 lb 12.8 oz (48 kg)  03/25/23 108 lb 12.8 oz (49.4 kg)  03/10/23 112 lb (50.8 kg)     Health Maintenance  Topic Date Due   COVID-19 Vaccine (3 - Pfizer risk series) 08/16/2019   Medicare Annual Wellness (AWV)  06/26/2022   INFLUENZA VACCINE  09/01/2023 (Originally 01/02/2023)   DTaP/Tdap/Td (4 - Td or Tdap) 05/23/2031   Pneumonia Vaccine 52+ Years old  Completed   DEXA SCAN  Completed   Zoster Vaccines- Shingrix  Completed   HPV VACCINES  Aged Out    There are no preventive care reminders to display for this patient.  Lab Results  Component Value Date   TSH 4.23 03/04/2023   Lab Results  Component Value Date   WBC 8.3 03/25/2023   HGB 13.1 03/25/2023   HCT 40.3 03/25/2023   MCV 96.5 03/25/2023   PLT 448.0 (H) 03/25/2023   Lab Results  Component Value Date   NA 135 03/25/2023   K 4.5 03/25/2023   CO2 28 03/25/2023   GLUCOSE 109 (H)  03/25/2023   BUN 17 03/25/2023   CREATININE 0.94 03/25/2023   BILITOT 0.4 03/25/2023   ALKPHOS 44 03/25/2023   AST 21 03/25/2023   ALT 18 03/25/2023   PROT 7.0 03/25/2023   ALBUMIN 3.8 03/25/2023   CALCIUM 9.3 03/25/2023   ANIONGAP 6 03/12/2023   EGFR 64 06/12/2021   GFR 50.19 (L) 03/25/2023   Lab Results  Component Value Date   CHOL 208 (H) 07/01/2022   Lab Results  Component Value Date  HDL 57.40 07/01/2022   Lab Results  Component Value Date   LDLCALC 124 (H) 07/01/2022   Lab Results  Component Value Date   TRIG 135.0 07/01/2022   Lab Results  Component Value Date   CHOLHDL 4 07/01/2022   No results found for: "HGBA1C"    Assessment & Plan:  Rib pain Assessment & Plan: Advised to perform deep breathing exercises. Continue to use Tylenol, alternate hot and cold pack and arthritic creams.  The chest x-ray shows low lung volumes with mild left basilar atelectasis, patient was treated with vancomycin. No displaced rib fracture is identified. Patient would let us know if symptoms not improving.   Orders: -     DG Chest 2 View; Future  Need for influenza vaccination -     Flu Vaccine Trivalent High Dose (Fluad)    Follow-up: No follow-ups on file.   Kara Dies, NP

## 2023-04-04 NOTE — Progress Notes (Signed)
No rib fracture identified. Mild left atelectasis and do not require antibiotic treatment. Keep [place] posted regarding the symptoms.

## 2023-04-08 ENCOUNTER — Telehealth: Payer: Self-pay

## 2023-04-08 DIAGNOSIS — J189 Pneumonia, unspecified organism: Secondary | ICD-10-CM | POA: Diagnosis not present

## 2023-04-08 DIAGNOSIS — I480 Paroxysmal atrial fibrillation: Secondary | ICD-10-CM | POA: Diagnosis not present

## 2023-04-08 DIAGNOSIS — A0472 Enterocolitis due to Clostridium difficile, not specified as recurrent: Secondary | ICD-10-CM | POA: Diagnosis not present

## 2023-04-08 DIAGNOSIS — I5032 Chronic diastolic (congestive) heart failure: Secondary | ICD-10-CM | POA: Diagnosis not present

## 2023-04-08 DIAGNOSIS — K810 Acute cholecystitis: Secondary | ICD-10-CM | POA: Diagnosis not present

## 2023-04-08 DIAGNOSIS — N1831 Chronic kidney disease, stage 3a: Secondary | ICD-10-CM | POA: Diagnosis not present

## 2023-04-08 NOTE — Telephone Encounter (Signed)
Spoke to pt daughter Clydie Braun. Clydie Braun stated that pt was not complaining of any sob just in a lot of pain. Advise Clydie Braun that pt should be seen. Clydie Braun declined. Informed Clydie Braun to see how pt does tomorrow and that if she is in that much pain she should be seen asap. Informed Clydie Braun I would call tomorrow to see how pt is doing

## 2023-04-08 NOTE — Telephone Encounter (Signed)
Patient's daughter, Chandra Batch, called to state that since patient was seen on 04/04/2023 by Kara Dies, NP, they have been giving her Tylenol Extended Release for muscle aches and pains.  Clydie Braun states they have been putting a menthol-type balm on patient's back and chest where patient states it hurts and they have been using a heating pad.  Clydie Braun states it has been two weeks and patient states she is in extreme pain.  Clydie Braun states she would like to talk with [place] to find out what was discussed during the visit on 04/04/2023 since she was unable to be there.  Clydie Braun states her sister came with their Mom, and she doesn't usually ask as many questions as Clydie Braun would ask.  Clydie Braun states patient states she would like to know if there is anything else she can take for pain or anything else they can do or be looking for.  Clydie Braun states she would like to know if there are movements patient should be trying to do or not be trying to do?    Clydie Braun states patient states she does not know if she can get through this.  Clydie Braun states patient told her occupational therapist that she never understood until now why people would shoot themselves because of pain, but now she understands.  Patient states she would never do that, but she now understands.  Clydie Braun states patient is not suicidal.

## 2023-04-08 NOTE — Telephone Encounter (Signed)
Need to confirm if sob.  She is already using tylenol.  Unsure if she has tried lidocaine patch. If having extreme pain and nothing is helping and pain getting worse, she needs to be evaluated.

## 2023-04-09 ENCOUNTER — Ambulatory Visit: Payer: Medicare Other | Admitting: Radiology

## 2023-04-09 NOTE — Telephone Encounter (Signed)
Spoke to pt's daughter Clydie Braun. Stated that pt is doing good this morning and hasn't had any complaints of pain. Advise daughter that if she does complaining of pain to to tak ept to ED or Urgent care

## 2023-04-09 NOTE — Telephone Encounter (Signed)
Patient called office back

## 2023-04-09 NOTE — Telephone Encounter (Signed)
  Lvm for pt's daughter to give office a call back

## 2023-04-09 NOTE — Telephone Encounter (Signed)
FYI. Just wanted you to be aware.  

## 2023-04-11 DIAGNOSIS — I480 Paroxysmal atrial fibrillation: Secondary | ICD-10-CM | POA: Diagnosis not present

## 2023-04-11 DIAGNOSIS — N1831 Chronic kidney disease, stage 3a: Secondary | ICD-10-CM | POA: Diagnosis not present

## 2023-04-11 DIAGNOSIS — I5032 Chronic diastolic (congestive) heart failure: Secondary | ICD-10-CM | POA: Diagnosis not present

## 2023-04-11 DIAGNOSIS — A0472 Enterocolitis due to Clostridium difficile, not specified as recurrent: Secondary | ICD-10-CM | POA: Diagnosis not present

## 2023-04-11 DIAGNOSIS — J189 Pneumonia, unspecified organism: Secondary | ICD-10-CM | POA: Diagnosis not present

## 2023-04-11 DIAGNOSIS — K810 Acute cholecystitis: Secondary | ICD-10-CM | POA: Diagnosis not present

## 2023-04-14 NOTE — Assessment & Plan Note (Signed)
Advised to perform deep breathing exercises. Continue to use Tylenol, alternate hot and cold pack and arthritic creams.  The chest x-ray shows low lung volumes with mild left basilar atelectasis, patient was treated with vancomycin. No displaced rib fracture is identified. Patient would let us know if symptoms not improving.

## 2023-04-15 DIAGNOSIS — I5032 Chronic diastolic (congestive) heart failure: Secondary | ICD-10-CM | POA: Diagnosis not present

## 2023-04-15 DIAGNOSIS — N1831 Chronic kidney disease, stage 3a: Secondary | ICD-10-CM | POA: Diagnosis not present

## 2023-04-15 DIAGNOSIS — J189 Pneumonia, unspecified organism: Secondary | ICD-10-CM | POA: Diagnosis not present

## 2023-04-15 DIAGNOSIS — K810 Acute cholecystitis: Secondary | ICD-10-CM | POA: Diagnosis not present

## 2023-04-15 DIAGNOSIS — I480 Paroxysmal atrial fibrillation: Secondary | ICD-10-CM | POA: Diagnosis not present

## 2023-04-15 DIAGNOSIS — A0472 Enterocolitis due to Clostridium difficile, not specified as recurrent: Secondary | ICD-10-CM | POA: Diagnosis not present

## 2023-04-16 DIAGNOSIS — N1831 Chronic kidney disease, stage 3a: Secondary | ICD-10-CM | POA: Diagnosis not present

## 2023-04-16 DIAGNOSIS — I5032 Chronic diastolic (congestive) heart failure: Secondary | ICD-10-CM | POA: Diagnosis not present

## 2023-04-16 DIAGNOSIS — J189 Pneumonia, unspecified organism: Secondary | ICD-10-CM | POA: Diagnosis not present

## 2023-04-16 DIAGNOSIS — A0472 Enterocolitis due to Clostridium difficile, not specified as recurrent: Secondary | ICD-10-CM | POA: Diagnosis not present

## 2023-04-16 DIAGNOSIS — I480 Paroxysmal atrial fibrillation: Secondary | ICD-10-CM | POA: Diagnosis not present

## 2023-04-16 DIAGNOSIS — K810 Acute cholecystitis: Secondary | ICD-10-CM | POA: Diagnosis not present

## 2023-04-17 DIAGNOSIS — E039 Hypothyroidism, unspecified: Secondary | ICD-10-CM | POA: Diagnosis not present

## 2023-04-17 DIAGNOSIS — F32A Depression, unspecified: Secondary | ICD-10-CM | POA: Diagnosis not present

## 2023-04-17 DIAGNOSIS — K5790 Diverticulosis of intestine, part unspecified, without perforation or abscess without bleeding: Secondary | ICD-10-CM | POA: Diagnosis not present

## 2023-04-17 DIAGNOSIS — K222 Esophageal obstruction: Secondary | ICD-10-CM | POA: Diagnosis not present

## 2023-04-17 DIAGNOSIS — E785 Hyperlipidemia, unspecified: Secondary | ICD-10-CM | POA: Diagnosis not present

## 2023-04-17 DIAGNOSIS — K859 Acute pancreatitis without necrosis or infection, unspecified: Secondary | ICD-10-CM | POA: Diagnosis not present

## 2023-04-17 DIAGNOSIS — J189 Pneumonia, unspecified organism: Secondary | ICD-10-CM | POA: Diagnosis not present

## 2023-04-17 DIAGNOSIS — Z87891 Personal history of nicotine dependence: Secondary | ICD-10-CM | POA: Diagnosis not present

## 2023-04-17 DIAGNOSIS — A0472 Enterocolitis due to Clostridium difficile, not specified as recurrent: Secondary | ICD-10-CM | POA: Diagnosis not present

## 2023-04-17 DIAGNOSIS — I480 Paroxysmal atrial fibrillation: Secondary | ICD-10-CM | POA: Diagnosis not present

## 2023-04-17 DIAGNOSIS — K810 Acute cholecystitis: Secondary | ICD-10-CM | POA: Diagnosis not present

## 2023-04-17 DIAGNOSIS — N1831 Chronic kidney disease, stage 3a: Secondary | ICD-10-CM | POA: Diagnosis not present

## 2023-04-17 DIAGNOSIS — I5032 Chronic diastolic (congestive) heart failure: Secondary | ICD-10-CM | POA: Diagnosis not present

## 2023-04-17 DIAGNOSIS — D631 Anemia in chronic kidney disease: Secondary | ICD-10-CM | POA: Diagnosis not present

## 2023-04-18 DIAGNOSIS — K222 Esophageal obstruction: Secondary | ICD-10-CM | POA: Diagnosis not present

## 2023-04-18 DIAGNOSIS — K810 Acute cholecystitis: Secondary | ICD-10-CM | POA: Diagnosis not present

## 2023-04-18 DIAGNOSIS — N1831 Chronic kidney disease, stage 3a: Secondary | ICD-10-CM | POA: Diagnosis not present

## 2023-04-18 DIAGNOSIS — D631 Anemia in chronic kidney disease: Secondary | ICD-10-CM | POA: Diagnosis not present

## 2023-04-18 DIAGNOSIS — I5032 Chronic diastolic (congestive) heart failure: Secondary | ICD-10-CM | POA: Diagnosis not present

## 2023-04-18 DIAGNOSIS — J189 Pneumonia, unspecified organism: Secondary | ICD-10-CM | POA: Diagnosis not present

## 2023-04-18 DIAGNOSIS — K859 Acute pancreatitis without necrosis or infection, unspecified: Secondary | ICD-10-CM | POA: Diagnosis not present

## 2023-04-18 DIAGNOSIS — E039 Hypothyroidism, unspecified: Secondary | ICD-10-CM | POA: Diagnosis not present

## 2023-04-18 DIAGNOSIS — I480 Paroxysmal atrial fibrillation: Secondary | ICD-10-CM | POA: Diagnosis not present

## 2023-04-18 DIAGNOSIS — F32A Depression, unspecified: Secondary | ICD-10-CM | POA: Diagnosis not present

## 2023-04-18 DIAGNOSIS — K5790 Diverticulosis of intestine, part unspecified, without perforation or abscess without bleeding: Secondary | ICD-10-CM | POA: Diagnosis not present

## 2023-04-18 DIAGNOSIS — A0472 Enterocolitis due to Clostridium difficile, not specified as recurrent: Secondary | ICD-10-CM | POA: Diagnosis not present

## 2023-04-20 ENCOUNTER — Other Ambulatory Visit: Payer: Self-pay | Admitting: Family

## 2023-04-20 DIAGNOSIS — E039 Hypothyroidism, unspecified: Secondary | ICD-10-CM

## 2023-04-23 DIAGNOSIS — N1831 Chronic kidney disease, stage 3a: Secondary | ICD-10-CM | POA: Diagnosis not present

## 2023-04-23 DIAGNOSIS — I480 Paroxysmal atrial fibrillation: Secondary | ICD-10-CM | POA: Diagnosis not present

## 2023-04-23 DIAGNOSIS — A0472 Enterocolitis due to Clostridium difficile, not specified as recurrent: Secondary | ICD-10-CM | POA: Diagnosis not present

## 2023-04-23 DIAGNOSIS — I5032 Chronic diastolic (congestive) heart failure: Secondary | ICD-10-CM | POA: Diagnosis not present

## 2023-04-23 DIAGNOSIS — J189 Pneumonia, unspecified organism: Secondary | ICD-10-CM | POA: Diagnosis not present

## 2023-04-23 DIAGNOSIS — K810 Acute cholecystitis: Secondary | ICD-10-CM | POA: Diagnosis not present

## 2023-04-25 DIAGNOSIS — A0472 Enterocolitis due to Clostridium difficile, not specified as recurrent: Secondary | ICD-10-CM | POA: Diagnosis not present

## 2023-04-25 DIAGNOSIS — N1831 Chronic kidney disease, stage 3a: Secondary | ICD-10-CM | POA: Diagnosis not present

## 2023-04-25 DIAGNOSIS — I5032 Chronic diastolic (congestive) heart failure: Secondary | ICD-10-CM | POA: Diagnosis not present

## 2023-04-25 DIAGNOSIS — K810 Acute cholecystitis: Secondary | ICD-10-CM | POA: Diagnosis not present

## 2023-04-25 DIAGNOSIS — J189 Pneumonia, unspecified organism: Secondary | ICD-10-CM | POA: Diagnosis not present

## 2023-04-25 DIAGNOSIS — I480 Paroxysmal atrial fibrillation: Secondary | ICD-10-CM | POA: Diagnosis not present

## 2023-04-28 ENCOUNTER — Ambulatory Visit
Admission: RE | Admit: 2023-04-28 | Discharge: 2023-04-28 | Disposition: A | Discharge status: Home / Self Care | Payer: Medicare Other | Source: Ambulatory Visit | Attending: Interventional Radiology | Admitting: Interventional Radiology

## 2023-04-28 ENCOUNTER — Other Ambulatory Visit: Payer: Self-pay | Admitting: Interventional Radiology

## 2023-04-28 DIAGNOSIS — K810 Acute cholecystitis: Secondary | ICD-10-CM | POA: Diagnosis not present

## 2023-04-28 DIAGNOSIS — T85590A Other mechanical complication of bile duct prosthesis, initial encounter: Secondary | ICD-10-CM | POA: Diagnosis not present

## 2023-04-28 HISTORY — PX: IR CHOLANGIOGRAM EXISTING TUBE: IMG6040

## 2023-04-28 MED ORDER — IOHEXOL 300 MG/ML  SOLN
10.0000 mL | Freq: Once | INTRAMUSCULAR | Status: AC | PRN
  Administered 2023-04-28: 10 mL

## 2023-04-29 ENCOUNTER — Telehealth: Payer: Self-pay | Admitting: Family Medicine

## 2023-04-29 DIAGNOSIS — N1831 Chronic kidney disease, stage 3a: Secondary | ICD-10-CM | POA: Diagnosis not present

## 2023-04-29 DIAGNOSIS — A0472 Enterocolitis due to Clostridium difficile, not specified as recurrent: Secondary | ICD-10-CM | POA: Diagnosis not present

## 2023-04-29 DIAGNOSIS — I5032 Chronic diastolic (congestive) heart failure: Secondary | ICD-10-CM | POA: Diagnosis not present

## 2023-04-29 DIAGNOSIS — J189 Pneumonia, unspecified organism: Secondary | ICD-10-CM | POA: Diagnosis not present

## 2023-04-29 DIAGNOSIS — I480 Paroxysmal atrial fibrillation: Secondary | ICD-10-CM | POA: Diagnosis not present

## 2023-04-29 DIAGNOSIS — K810 Acute cholecystitis: Secondary | ICD-10-CM | POA: Diagnosis not present

## 2023-04-29 NOTE — Telephone Encounter (Signed)
Darl Pikes called from home health and patients's family said they feel patient doesn't need nursing anymore. Darl Pikes said she needs verbal order from her provider to stop the nursing only. Susan's number is [phone]. She just needs verbal order from provider.

## 2023-04-29 NOTE — Telephone Encounter (Signed)
Called Darl Pikes and she said that pt refused Pt as well as nursing so she is going to be dismissed all together.

## 2023-04-29 NOTE — Telephone Encounter (Signed)
Noted  

## 2023-04-29 NOTE — Telephone Encounter (Signed)
It is ok to give a verbal order to stop nursing.

## 2023-05-07 ENCOUNTER — Other Ambulatory Visit: Payer: Self-pay | Admitting: Interventional Radiology

## 2023-05-07 ENCOUNTER — Ambulatory Visit: Payer: Medicare Other | Admitting: Family Medicine

## 2023-05-07 DIAGNOSIS — K810 Acute cholecystitis: Secondary | ICD-10-CM

## 2023-05-09 DIAGNOSIS — K810 Acute cholecystitis: Secondary | ICD-10-CM | POA: Diagnosis not present

## 2023-05-09 DIAGNOSIS — I5032 Chronic diastolic (congestive) heart failure: Secondary | ICD-10-CM | POA: Diagnosis not present

## 2023-05-09 DIAGNOSIS — A0472 Enterocolitis due to Clostridium difficile, not specified as recurrent: Secondary | ICD-10-CM | POA: Diagnosis not present

## 2023-05-09 DIAGNOSIS — J189 Pneumonia, unspecified organism: Secondary | ICD-10-CM | POA: Diagnosis not present

## 2023-05-09 DIAGNOSIS — N1831 Chronic kidney disease, stage 3a: Secondary | ICD-10-CM | POA: Diagnosis not present

## 2023-05-09 DIAGNOSIS — I480 Paroxysmal atrial fibrillation: Secondary | ICD-10-CM | POA: Diagnosis not present

## 2023-05-09 NOTE — Progress Notes (Signed)
Patient for IR Chole Tube Exchange on Mon 05/12/2023, I called and spoke with the patient's daughter, Clydie Braun on the phone and gave pre-procedure instructions. Clydie Braun was made aware to have the patient here at 12p and check in at the new entrance. Clydie Braun stated understanding.  Called 05/07/2023

## 2023-05-12 ENCOUNTER — Ambulatory Visit
Admission: RE | Admit: 2023-05-12 | Discharge: 2023-05-12 | Disposition: A | Discharge status: Home / Self Care | Payer: Medicare Other | Source: Ambulatory Visit | Attending: Interventional Radiology | Admitting: Interventional Radiology

## 2023-05-12 DIAGNOSIS — Z434 Encounter for attention to other artificial openings of digestive tract: Secondary | ICD-10-CM | POA: Diagnosis not present

## 2023-05-12 DIAGNOSIS — K810 Acute cholecystitis: Secondary | ICD-10-CM | POA: Diagnosis not present

## 2023-05-12 HISTORY — PX: IR EXCHANGE BILIARY DRAIN: IMG6046

## 2023-05-12 MED ORDER — LIDOCAINE HCL 1 % IJ SOLN
5.0000 mL | Freq: Once | INTRAMUSCULAR | Status: AC
  Administered 2023-05-12: 5 mL via INTRADERMAL

## 2023-05-12 MED ORDER — IOHEXOL 300 MG/ML  SOLN
5.0000 mL | Freq: Once | INTRAMUSCULAR | Status: AC | PRN
  Administered 2023-05-12: 10 mL

## 2023-05-12 MED ORDER — LIDOCAINE HCL 1 % IJ SOLN
INTRAMUSCULAR | Status: AC
  Filled 2023-05-12: qty 20

## 2023-05-12 NOTE — Procedures (Signed)
Interventional Radiology Procedure Note  Procedure: cholecystostomy exchg    Complications: None  Estimated Blood Loss:  0  Findings: 42fr tube Full report in pacs     Sharen Counter, MD

## 2023-05-14 DIAGNOSIS — J189 Pneumonia, unspecified organism: Secondary | ICD-10-CM | POA: Diagnosis not present

## 2023-05-14 DIAGNOSIS — A0472 Enterocolitis due to Clostridium difficile, not specified as recurrent: Secondary | ICD-10-CM | POA: Diagnosis not present

## 2023-05-14 DIAGNOSIS — I480 Paroxysmal atrial fibrillation: Secondary | ICD-10-CM | POA: Diagnosis not present

## 2023-05-14 DIAGNOSIS — N1831 Chronic kidney disease, stage 3a: Secondary | ICD-10-CM | POA: Diagnosis not present

## 2023-05-14 DIAGNOSIS — I5032 Chronic diastolic (congestive) heart failure: Secondary | ICD-10-CM | POA: Diagnosis not present

## 2023-05-14 DIAGNOSIS — K810 Acute cholecystitis: Secondary | ICD-10-CM | POA: Diagnosis not present

## 2023-05-15 ENCOUNTER — Telehealth: Payer: Self-pay | Admitting: Family Medicine

## 2023-05-15 DIAGNOSIS — Z23 Encounter for immunization: Secondary | ICD-10-CM | POA: Diagnosis not present

## 2023-05-15 NOTE — Telephone Encounter (Signed)
You can give verbal orders for her home health PT as noted in the initial message. Thanks.

## 2023-05-15 NOTE — Telephone Encounter (Signed)
Misty Stanley from [facility] called stating the pt need recertification for PT once a week for four weeks and then one every two week four

## 2023-05-15 NOTE — Telephone Encounter (Signed)
Verbal orders given to Stacy 

## 2023-05-17 DIAGNOSIS — D631 Anemia in chronic kidney disease: Secondary | ICD-10-CM | POA: Diagnosis not present

## 2023-05-17 DIAGNOSIS — Z87891 Personal history of nicotine dependence: Secondary | ICD-10-CM | POA: Diagnosis not present

## 2023-05-17 DIAGNOSIS — I5032 Chronic diastolic (congestive) heart failure: Secondary | ICD-10-CM | POA: Diagnosis not present

## 2023-05-17 DIAGNOSIS — E039 Hypothyroidism, unspecified: Secondary | ICD-10-CM | POA: Diagnosis not present

## 2023-05-17 DIAGNOSIS — N1831 Chronic kidney disease, stage 3a: Secondary | ICD-10-CM | POA: Diagnosis not present

## 2023-05-17 DIAGNOSIS — K859 Acute pancreatitis without necrosis or infection, unspecified: Secondary | ICD-10-CM | POA: Diagnosis not present

## 2023-05-17 DIAGNOSIS — K810 Acute cholecystitis: Secondary | ICD-10-CM | POA: Diagnosis not present

## 2023-05-17 DIAGNOSIS — Z8701 Personal history of pneumonia (recurrent): Secondary | ICD-10-CM | POA: Diagnosis not present

## 2023-05-17 DIAGNOSIS — K5790 Diverticulosis of intestine, part unspecified, without perforation or abscess without bleeding: Secondary | ICD-10-CM | POA: Diagnosis not present

## 2023-05-17 DIAGNOSIS — A0472 Enterocolitis due to Clostridium difficile, not specified as recurrent: Secondary | ICD-10-CM | POA: Diagnosis not present

## 2023-05-17 DIAGNOSIS — K222 Esophageal obstruction: Secondary | ICD-10-CM | POA: Diagnosis not present

## 2023-05-17 DIAGNOSIS — E785 Hyperlipidemia, unspecified: Secondary | ICD-10-CM | POA: Diagnosis not present

## 2023-05-17 DIAGNOSIS — I480 Paroxysmal atrial fibrillation: Secondary | ICD-10-CM | POA: Diagnosis not present

## 2023-05-17 DIAGNOSIS — F32A Depression, unspecified: Secondary | ICD-10-CM | POA: Diagnosis not present

## 2023-05-19 DIAGNOSIS — I480 Paroxysmal atrial fibrillation: Secondary | ICD-10-CM | POA: Diagnosis not present

## 2023-05-19 DIAGNOSIS — A0472 Enterocolitis due to Clostridium difficile, not specified as recurrent: Secondary | ICD-10-CM | POA: Diagnosis not present

## 2023-05-19 DIAGNOSIS — N1831 Chronic kidney disease, stage 3a: Secondary | ICD-10-CM | POA: Diagnosis not present

## 2023-05-19 DIAGNOSIS — K859 Acute pancreatitis without necrosis or infection, unspecified: Secondary | ICD-10-CM | POA: Diagnosis not present

## 2023-05-19 DIAGNOSIS — D631 Anemia in chronic kidney disease: Secondary | ICD-10-CM | POA: Diagnosis not present

## 2023-05-19 DIAGNOSIS — I5032 Chronic diastolic (congestive) heart failure: Secondary | ICD-10-CM | POA: Diagnosis not present

## 2023-05-26 ENCOUNTER — Other Ambulatory Visit: Payer: Self-pay | Admitting: Family Medicine

## 2023-05-26 ENCOUNTER — Other Ambulatory Visit: Payer: Self-pay

## 2023-05-26 ENCOUNTER — Telehealth: Payer: Self-pay

## 2023-05-26 DIAGNOSIS — D631 Anemia in chronic kidney disease: Secondary | ICD-10-CM | POA: Diagnosis not present

## 2023-05-26 DIAGNOSIS — E039 Hypothyroidism, unspecified: Secondary | ICD-10-CM

## 2023-05-26 DIAGNOSIS — A0472 Enterocolitis due to Clostridium difficile, not specified as recurrent: Secondary | ICD-10-CM | POA: Diagnosis not present

## 2023-05-26 DIAGNOSIS — N1831 Chronic kidney disease, stage 3a: Secondary | ICD-10-CM | POA: Diagnosis not present

## 2023-05-26 DIAGNOSIS — K859 Acute pancreatitis without necrosis or infection, unspecified: Secondary | ICD-10-CM | POA: Diagnosis not present

## 2023-05-26 DIAGNOSIS — I5032 Chronic diastolic (congestive) heart failure: Secondary | ICD-10-CM | POA: Diagnosis not present

## 2023-05-26 DIAGNOSIS — I480 Paroxysmal atrial fibrillation: Secondary | ICD-10-CM | POA: Diagnosis not present

## 2023-05-26 MED ORDER — AMIODARONE HCL 200 MG PO TABS: 200.0000 mg | ORAL_TABLET | Freq: Every day | ORAL | 3 refills | Status: DC

## 2023-05-26 MED ORDER — LEVOTHYROXINE SODIUM 50 MCG PO TABS: 50.0000 ug | ORAL_TABLET | Freq: Every day | ORAL | 0 refills | Status: DC

## 2023-05-26 NOTE — Telephone Encounter (Signed)
Copied from CRM #[id number]. Topic: Clinical - Prescription Issue >> May 26, 2023  9:29 AM Orinda Kenner C wrote: Reason for CRM: Pls cb pt's dtr Lupita Leash [phone] is having issues with [facility], they cancelled levothyroxine (SYNTHROID) 50 MCG tablet. Pt needs a small amount to local pharmacy [facility] [address] [place] -[address] [place] [address] Phone:[phone] and the a 90 days supply to [facility].   amiodarone (PACERONE) 200 MG tablet rf to [facility] - [place], [place] - [address] [place] [address] [phone]:[phone]

## 2023-05-26 NOTE — Telephone Encounter (Signed)
Levothyroxine sent to [facility] and Amiodarone sent to [facility].

## 2023-05-27 ENCOUNTER — Other Ambulatory Visit: Payer: Self-pay

## 2023-05-27 DIAGNOSIS — J309 Allergic rhinitis, unspecified: Secondary | ICD-10-CM

## 2023-05-27 DIAGNOSIS — K219 Gastro-esophageal reflux disease without esophagitis: Secondary | ICD-10-CM

## 2023-05-27 MED ORDER — MONTELUKAST SODIUM 10 MG PO TABS: 10.0000 mg | ORAL_TABLET | Freq: Every day | ORAL | 1 refills | Status: DC

## 2023-05-27 MED ORDER — OMEPRAZOLE 40 MG PO CPDR: DELAYED_RELEASE_CAPSULE | ORAL | 1 refills | Status: DC

## 2023-05-30 ENCOUNTER — Ambulatory Visit
Admission: RE | Admit: 2023-05-30 | Discharge: 2023-05-30 | Disposition: A | Discharge status: Home / Self Care | Payer: Medicare Other | Source: Ambulatory Visit | Attending: Nurse Practitioner | Admitting: Nurse Practitioner

## 2023-05-30 ENCOUNTER — Other Ambulatory Visit: Payer: Self-pay | Admitting: Nurse Practitioner

## 2023-05-30 ENCOUNTER — Ambulatory Visit (INDEPENDENT_AMBULATORY_CARE_PROVIDER_SITE_OTHER): Payer: Medicare Other | Admitting: Nurse Practitioner

## 2023-05-30 ENCOUNTER — Ambulatory Visit: Payer: Self-pay | Admitting: Family Medicine

## 2023-05-30 VITALS — BP 118/70 | HR 85 | Temp 98.2°F | Ht 59.0 in | Wt 109.0 lb

## 2023-05-30 DIAGNOSIS — Z8619 Personal history of other infectious and parasitic diseases: Secondary | ICD-10-CM | POA: Insufficient documentation

## 2023-05-30 DIAGNOSIS — K8020 Calculus of gallbladder without cholecystitis without obstruction: Secondary | ICD-10-CM | POA: Diagnosis not present

## 2023-05-30 DIAGNOSIS — Z8719 Personal history of other diseases of the digestive system: Secondary | ICD-10-CM | POA: Diagnosis not present

## 2023-05-30 DIAGNOSIS — R1032 Left lower quadrant pain: Secondary | ICD-10-CM | POA: Insufficient documentation

## 2023-05-30 DIAGNOSIS — K5792 Diverticulitis of intestine, part unspecified, without perforation or abscess without bleeding: Secondary | ICD-10-CM

## 2023-05-30 DIAGNOSIS — R197 Diarrhea, unspecified: Secondary | ICD-10-CM | POA: Insufficient documentation

## 2023-05-30 DIAGNOSIS — K5730 Diverticulosis of large intestine without perforation or abscess without bleeding: Secondary | ICD-10-CM | POA: Diagnosis not present

## 2023-05-30 DIAGNOSIS — K449 Diaphragmatic hernia without obstruction or gangrene: Secondary | ICD-10-CM | POA: Diagnosis not present

## 2023-05-30 MED ORDER — AMOXICILLIN-POT CLAVULANATE 875-125 MG PO TABS
1.0000 | ORAL_TABLET | Freq: Two times a day (BID) | ORAL | 0 refills | Status: AC
Start: 2023-05-30 — End: 2023-06-09

## 2023-05-30 MED ORDER — IOHEXOL 300 MG/ML  SOLN
75.0000 mL | Freq: Once | INTRAMUSCULAR | Status: AC | PRN
  Administered 2023-05-30: 75 mL via INTRAVENOUS

## 2023-05-30 NOTE — Assessment & Plan Note (Signed)
Given the patient's presentation with diarrhea and left lower quadrant pain concern for possible diverticulitis.  Do not want to treat patient empirically given she just had C. difficile.  Pending CBC and CMP.  Stat CT scan abdomen pelvis to rule out possible diverticulitis with abscess.  Pending results ED precautions reviewed

## 2023-05-30 NOTE — Assessment & Plan Note (Signed)
Pending stool study push fluids

## 2023-05-30 NOTE — Telephone Encounter (Signed)
Chief Complaint: diarrhea Symptoms: diarrhea, (yesterday) abdominal pain Frequency: x 4 days Pertinent Negatives: Patient denies fever, nausea, vomiting, blood in stool Disposition: [] ED /[] Urgent Care (no appt availability in office) / [x] Appointment(In office/virtual)/ []  [facility]/ [] Home Care/ [] Refused Recommended Disposition /[] [facility]/ []  Follow-up with PCP Additional Notes: Pt's daughter, Steward Drone, called triage line stating pt having diarrhea x 4 days, improved yesterday in frequency. Daughter poor historian. Unable to quantify how many stools due to pt wears diapers and she states she is not always around the pt. Pt at hair appt, not with daughter during triage. Daughter agreeable to take pt to [facility] due to PCP has no availability today. Daughter unsure if pt will be agreeable to visit. Educated daughter on concerning symptoms to call back for. Copied from CRM [id number]. Topic: Clinical - Pink Word Triage >> May 30, 2023  9:48 AM Orinda Kenner C wrote: Reason for Triage: Pt's daughter Glyn Ade [phone]. Pt recently had C. Diff, was in the hospital and is trying to get better. Pt is having diarrhea for 4 days now, yesterday seem like Sx improved, however diarrhea again today. Pt c/o stomach pain and not feeling good. Steward Drone is concerned about pt showing signs for C. Diff again. Pls advise and c/b. Reason for Disposition  [1] MODERATE diarrhea (e.g., 4-6 times / day more than normal) AND [2] age > 70 years  Answer Assessment - Initial Assessment Questions 1. DIARRHEA SEVERITY: "How bad is the diarrhea?" "How many more stools have you had in the past 24 hours than normal?"    - NO DIARRHEA (SCALE 0)   - MILD (SCALE 1-3): Few loose or mushy BMs; increase of 1-3 stools over normal daily number of stools; mild increase in ostomy output.   -  MODERATE (SCALE 4-7): Increase of 4-6 stools daily over normal; moderate increase in ostomy output.   -  SEVERE  (SCALE 8-10; OR "WORST POSSIBLE"): Increase of 7 or more stools daily over normal; moderate increase in ostomy output; incontinence.     Pt's daughter unable to answer. Asked how many stools/severity/and how many episodes of diarrhea. Daughter unable to answer any of those questions. She states yesterday was less frequent but states the patient wears a diaper so she is unsure.  2. ONSET: "When did the diarrhea begin?"       X 4 days.  3. BM CONSISTENCY: "How loose or watery is the diarrhea?"      Watery, with some bits of loose stool.  4. VOMITING: "Are you also vomiting?" If Yes, ask: "How many times in the past 24 hours?"      Denies.  5. ABDOMEN PAIN: "Are you having any abdomen pain?" If Yes, ask: "What does it feel like?" (e.g., crampy, dull, intermittent, constant)      Pt c/o abdominal pain yesterday. Daughter is not with pt at this time, states she is at a hair appt.  6. ABDOMEN PAIN SEVERITY: If present, ask: "How bad is the pain?"  (e.g., Scale 1-10; mild, moderate, or severe)   - MILD (1-3): doesn't interfere with normal activities, abdomen soft and not tender to touch    - MODERATE (4-7): interferes with normal activities or awakens from sleep, abdomen tender to touch    - SEVERE (8-10): excruciating pain, doubled over, unable to do any normal activities       Unable to assess.  7. ORAL INTAKE: If vomiting, "Have you been able to drink liquids?" "How  much liquids have you had in the past 24 hours?"     "Probably not". Daughter states pt drinks 1-2 vitamin waters and they give her a tumbler to try to give her fluids.  8. HYDRATION: "Any signs of dehydration?" (e.g., dry mouth [not just dry lips], too weak to stand, dizziness, new weight loss) "When did you last urinate?"     When asked if pt has been urinating daughter states "I guess so" and says it is difficult due to the diarrhea in the diaper. "I don't know how to answer that question".  9. EXPOSURE: "Have you traveled to a  foreign country recently?" "Have you been exposed to anyone with diarrhea?" "Could you have eaten any food that was spoiled?"     No recent foreign travel; daughter unsure if pt could have eaten spoiled food.  10. ANTIBIOTIC USE: "Are you taking antibiotics now or have you taken antibiotics in the past 2 months?"       Daughter states she does not think the pt has been on antibiotics in the last 2 months.  11. OTHER SYMPTOMS: "Do you have any other symptoms?" (e.g., fever, blood in stool)       Cough (daughter states she "normally has this".  Protocols used: [facility]

## 2023-05-30 NOTE — Patient Instructions (Signed)
Nice to see you today I will be in touch with the results If she gets worse or acutely ill go to the hospital

## 2023-05-30 NOTE — Assessment & Plan Note (Signed)
Patient recently treated for significant having diarrhea again pending stool study

## 2023-05-30 NOTE — Assessment & Plan Note (Signed)
Pending basic labs and CT scan of abdomen pelvis

## 2023-05-30 NOTE — Telephone Encounter (Signed)
Noted will evaluate in office 

## 2023-05-30 NOTE — Progress Notes (Signed)
Acute Office Visit  Subjective:     Patient ID: Kylie May, female    DOB: [birth year], 89 y.o.   MRN: [id number]  Chief Complaint  Patient presents with   Diarrhea    Pt complains of ongoing diarrhea since Monday. States that she had C-diff was in October and had diarrhea. Complains of having diarrhea but with pain in lower abdomen now when moving a bowel movement.     Diarrhea  Associated symptoms include abdominal pain. Pertinent negatives include no chills, fever or vomiting.   Patient is in today for diarrhea with a history of C. difficile, CHF, GERD, osteoporosis, A-fib, diverticulitis/losis.  Patient was recently seen in the emergency department on 03/10/2023 where she tested positive for C. difficile.  Patient was treated with vancomycin 125 mg capsules.  Patient had follow-up with primary care provider Dr. Birdie Sons on 03/25/2023.  Patient completed a 10-day course of oral vancomycin for diarrhea had improved  States that her daughter was there on Monday and states that she went to the bathroom a couple times with gas and a spot in her pad. States as the week progressed she has had more substance with the diarrhea.   States that she is having cramping that is intermittent. States that the most of the movements have been 3-4 times a day. States that she is pushing fluids. No blood in the stools    Review of Systems  Constitutional:  Negative for chills and fever.  Gastrointestinal:  Positive for abdominal pain and diarrhea. Negative for melena, nausea and vomiting.  Genitourinary:  Negative for dysuria and hematuria.  Neurological:  Negative for dizziness.        Objective:    BP 118/70   Pulse 85   Temp 98.2 F (36.8 C) (Oral)   Ht 4\' 11"  (1.499 m)   Wt 109 lb (49.4 kg)   SpO2 99%   BMI 22.02 kg/m    Physical Exam Vitals and nursing note reviewed.  Constitutional:      Appearance: Normal appearance.  Cardiovascular:     Rate and Rhythm: Normal rate  and regular rhythm.     Heart sounds: Normal heart sounds.  Pulmonary:     Effort: Pulmonary effort is normal.     Breath sounds: Normal breath sounds.  Abdominal:     General: Bowel sounds are normal. There is no distension.     Palpations: There is no mass.     Tenderness: There is abdominal tenderness.     Hernia: No hernia is present.    Neurological:     Mental Status: She is alert.     No results found for any visits on 05/30/23.      Assessment & Plan:   Problem List Items Addressed This Visit       Digestive   Diarrhea of presumed infectious origin   Patient recently treated for significant having diarrhea again pending stool study      Relevant Orders   C. difficile GDH and Toxin A/B     Other   History of diverticulitis   Pending basic labs and CT scan of abdomen pelvis      Relevant Orders   CT ABDOMEN PELVIS W CONTRAST   C. difficile GDH and Toxin A/B   Left lower quadrant abdominal pain - Primary   Given the patient's presentation with diarrhea and left lower quadrant pain concern for possible diverticulitis.  Do not want to treat patient empirically given she just  had C. difficile.  Pending CBC and CMP.  Stat CT scan abdomen pelvis to rule out possible diverticulitis with abscess.  Pending results ED precautions reviewed      Relevant Orders   CT ABDOMEN PELVIS W CONTRAST   Comprehensive metabolic panel   CBC with Differential/Platelet   POCT urinalysis dipstick   History of Clostridioides difficile infection   Pending stool study push fluids      Relevant Orders   CT ABDOMEN PELVIS W CONTRAST    No orders of the defined types were placed in this encounter.   Return if symptoms worsen or fail to improve.  Audria Nine, NP

## 2023-05-31 LAB — COMPREHENSIVE METABOLIC PANEL
AG Ratio: 1.5 (calc) (ref 1.0–2.5)
ALT: 39 U/L — ABNORMAL HIGH (ref 6–29)
AST: 37 U/L — ABNORMAL HIGH (ref 10–35)
Albumin: 3.5 g/dL — ABNORMAL LOW (ref 3.6–5.1)
Alkaline phosphatase (APISO): 66 U/L (ref 37–153)
BUN: 14 mg/dL (ref 7–25)
CO2: 21 mmol/L (ref 20–32)
Calcium: 8.7 mg/dL (ref 8.6–10.4)
Chloride: 102 mmol/L (ref 98–110)
Creat: 0.78 mg/dL (ref 0.60–0.95)
Globulin: 2.4 g/dL (ref 1.9–3.7)
Glucose, Bld: 101 mg/dL — ABNORMAL HIGH (ref 65–99)
Potassium: 3.7 mmol/L (ref 3.5–5.3)
Sodium: 133 mmol/L — ABNORMAL LOW (ref 135–146)
Total Bilirubin: 0.2 mg/dL (ref 0.2–1.2)
Total Protein: 5.9 g/dL — ABNORMAL LOW (ref 6.1–8.1)

## 2023-05-31 LAB — CBC WITH DIFFERENTIAL/PLATELET
Absolute Lymphocytes: 2470 {cells}/uL (ref 850–3900)
Absolute Monocytes: 1310 {cells}/uL — ABNORMAL HIGH (ref 200–950)
Basophils Absolute: 76 {cells}/uL (ref 0–200)
Basophils Relative: 0.6 %
Eosinophils Absolute: 214 {cells}/uL (ref 15–500)
Eosinophils Relative: 1.7 %
HCT: 37.6 % (ref 35.0–45.0)
Hemoglobin: 12.7 g/dL (ref 11.7–15.5)
MCH: 31.7 pg (ref 27.0–33.0)
MCHC: 33.8 g/dL (ref 32.0–36.0)
MCV: 93.8 fL (ref 80.0–100.0)
MPV: 10.5 fL (ref 7.5–12.5)
Monocytes Relative: 10.4 %
Neutro Abs: 8530 {cells}/uL — ABNORMAL HIGH (ref 1500–7800)
Neutrophils Relative %: 67.7 %
Platelets: 322 10*3/uL (ref 140–400)
RBC: 4.01 10*6/uL (ref 3.80–5.10)
RDW: 12.7 % (ref 11.0–15.0)
Total Lymphocyte: 19.6 %
WBC: 12.6 10*3/uL — ABNORMAL HIGH (ref 3.8–10.8)

## 2023-06-02 ENCOUNTER — Telehealth: Payer: Self-pay

## 2023-06-02 ENCOUNTER — Telehealth: Payer: Self-pay | Admitting: Family Medicine

## 2023-06-02 ENCOUNTER — Other Ambulatory Visit: Payer: Self-pay

## 2023-06-02 ENCOUNTER — Other Ambulatory Visit (HOSPITAL_COMMUNITY): Payer: Self-pay

## 2023-06-02 ENCOUNTER — Ambulatory Visit: Payer: Self-pay | Admitting: Family Medicine

## 2023-06-02 DIAGNOSIS — A498 Other bacterial infections of unspecified site: Secondary | ICD-10-CM

## 2023-06-02 LAB — C. DIFFICILE GDH AND TOXIN A/B
GDH ANTIGEN: DETECTED
MICRO NUMBER:: 15894440
SPECIMEN QUALITY:: ADEQUATE
TOXIN A AND B: DETECTED

## 2023-06-02 MED ORDER — FIDAXOMICIN 200 MG PO TABS: ORAL_TABLET | ORAL | 0 refills | Status: DC

## 2023-06-02 NOTE — Telephone Encounter (Signed)
CRM # [id number] Owner: None Status: Unresolved Open  Priority: Routine Created on: 06/02/2023 11:58 AM By: Macarthur Critchley   Primary Information  Source  Kylie May (Patient)   Subject  Kylie May (Patient)   Topic  Clinical - Medical Advice    Communication  Reason for CRM: Pts grandson Kylie May called to state that the pt was Dx with diverticulitis via CT scan, and was prescribed Augmentin, they checked MyChart today for lab results of the stool culture and seen where pt has C. Diff.  Pt is wanting to know how to proceed with treatment, should they continue the diverticulitis treatment, or put a hold on that to handle the c. Diff. Pts grandson would like call back at [phone]

## 2023-06-02 NOTE — Telephone Encounter (Signed)
Copied from CRM [id number]. Topic: Clinical - Medical Advice >> Jun 02, 2023  2:17 PM Elizebeth Brooking wrote: Reason for CRM: Patients Daughter called requesting a callback regarding discussing the pt's CTscans, lab results, and medication.

## 2023-06-02 NOTE — Telephone Encounter (Signed)
Spoke to Patient's daughter Clydie Braun and she wrote down the recommendations from Dr. Birdie Sons and is agreeable. Patient is also to continue the Augmentin that was called in last week for the Diverticulitis. Clydie Braun is agreeable to the GI referral. Clydie Braun was advised to use the same precautions as before when the Patient had C diff.

## 2023-06-02 NOTE — Telephone Encounter (Signed)
Left message for Kylie May to call the office back regarding the messages below.

## 2023-06-02 NOTE — Telephone Encounter (Signed)
Pt's daughter Kylie May called in and spoke to this RN regarding concerns that they were unable to fill her Dificid this evening due to insurance needing a PA and the out of pocket cost being $11,000. Family is requesting Vanco script as pharmacist advised this may be a more reasonable cost if it is not covered. This nurse educated family on PA process but Dtr continued to request a new script. This RN spoke to on-call provider Peggye Pitt regarding script. Advised due to pt's recent history of C-diff this may not be first line of treatment and PA should be completed as script is appropriate. Pt's dtr educated on PA process and reason for need to keep scripts as they have been prescribed. Pt's dtr would like a call back for an update when PA is completed. Pt's dtr verbalized understanding and agrees to plan.  Copied from CRM [id number]. Topic: Clinical - Medical Advice >> Jun 02, 2023  5:41 PM Taleah C wrote: Reason for CRM: Kylie May called back and is stated that the medication that was sent in to treat C.Diff has a high OOP cost and the pharmacy is needing a PA for it. The family is extremely worried and requested to speak with someone that is on call to help assist with this matter. Reason for Disposition . [1] Caller has NON-URGENT medicine question about med that PCP prescribed AND [2] triager unable to answer question  Answer Assessment - Initial Assessment Questions 1. NAME of MEDICINE: "What medicine(s) are you calling about?"     Dificid 2. QUESTION: "What is your question?" (e.g., double dose of medicine, side effect)     Patient is unable to fill this prescription as it needs a PA 3. PRESCRIBER: "Who prescribed the medicine?" Reason: if prescribed by specialist, call should be referred to that group.     Dr. Birdie Sons 4. SYMPTOMS: "Do you have any symptoms?" If Yes, ask: "What symptoms are you having?"  "How bad are the symptoms (e.g., mild, moderate, severe)     Diarrhea, confirmed  C-Diff  Protocols used: Medication Question Call-A-AH

## 2023-06-02 NOTE — Telephone Encounter (Signed)
LMTCB

## 2023-06-02 NOTE — Telephone Encounter (Signed)
Spoke to Kylie May again and let her know that Dr. Birdie Sons states the Patient needs to take both medications due to having diverticulitis and c diff. Kylie May understands and is agreeable. Kylie May will call GI to schedule an appointment for further evaluation.

## 2023-06-02 NOTE — Telephone Encounter (Signed)
Spoke with patient daughter to get her scheduled on Thursday. Only same day appointments available and not able to schedule. Informed patient daughter someone from the office will call her to schedule.

## 2023-06-02 NOTE — Telephone Encounter (Signed)
Pt has been scheduled with pcp on 06-06-23  Pts daughter stated she has not had results from stool culture and would like an update on that

## 2023-06-02 NOTE — Telephone Encounter (Signed)
Copied from CRM [id number]. Topic: Clinical - Lab/Test Results >> Jun 02, 2023 12:42 PM Ernst Spell wrote: Reason for CRM: Riki Rusk, pt's grandson (not on [place]), called back and stated that his question from the previous CRM was not answered when the nurse called and spoke to [place]. He is requesting for the nurse to call karen back again to discuss the pt's CT scans, lab results, and medication. Please call and advise with karen, who is listed on patient's DPR. Riki Rusk understood that he would not receive a callback regarding pt information but I would still send a message back to clinic. Please call and advise.

## 2023-06-02 NOTE — Telephone Encounter (Signed)
-----   Message from Kylie May sent at 05/30/2023  8:05 PM EST ----- Can we get the patient scheduled with Dr. Birdie Sons for [facility]05[facility]2023 [facility]

## 2023-06-02 NOTE — Telephone Encounter (Signed)
Please call the patients grandson and relay the message you gave to the patients daughter. GI referral placed.

## 2023-06-02 NOTE — Addendum Note (Signed)
Addended by: [name] on: 06/02/2023 02:44 PM   Modules accepted: Orders

## 2023-06-02 NOTE — Telephone Encounter (Signed)
Copied from CRM [id number]. Topic: Clinical - Medical Advice >> Jun 02, 2023  3:22 PM Gurney Maxin H wrote: Reason for CRM: Patients daughter returning call wanting to know if the diverticulitis treatment a priority right now? Has anyone compared results from scan from mid October to last Friday to see if was any different from before or is it something that needs to be treated. Daughter wants reassurance that it's not problematic to take both medication if the diverticulitis isn't that bad compared to what its been.  Clydie Braun [phone]

## 2023-06-02 NOTE — Addendum Note (Signed)
Addended by: [name] on: 06/02/2023 10:53 AM   Modules accepted: Orders

## 2023-06-02 NOTE — Telephone Encounter (Signed)
Noted.  Please let the patient's family member know that her C. difficile test was positive.  I am going to place her on a medicine called fidaxomicin.  She will take 200 mg twice daily for 10 days and then she will take 200 mg every other day for another 20 days.  I would suggest we get her into see GI given the recurrence of her C. difficile.  I can place that referral once you speak with her.

## 2023-06-03 ENCOUNTER — Other Ambulatory Visit (HOSPITAL_COMMUNITY): Payer: Self-pay

## 2023-06-03 ENCOUNTER — Other Ambulatory Visit: Payer: Self-pay | Admitting: Nurse Practitioner

## 2023-06-03 DIAGNOSIS — A0472 Enterocolitis due to Clostridium difficile, not specified as recurrent: Secondary | ICD-10-CM

## 2023-06-03 MED ORDER — VANCOMYCIN HCL 125 MG PO CAPS: 125.0000 mg | ORAL_CAPSULE | Freq: Four times a day (QID) | ORAL | 0 refills | Status: AC

## 2023-06-03 NOTE — Telephone Encounter (Signed)
 PA started for Dificid on CoverMyMeds with [employer].   Key: [id number]. Will await determination from plan.

## 2023-06-03 NOTE — Telephone Encounter (Signed)
 Noted. We can start patient on vancomycin  so that she is getting some treatment for the c diff and try to proceed with the PA for dificid  in case the cost comes down with the PA. Please call the pharmacy and see what we need to do for the PA. The patient was previously treated with vancomycin  so dificid  would be the preferred treatment option.

## 2023-06-03 NOTE — Telephone Encounter (Signed)
 Noted.

## 2023-06-03 NOTE — Telephone Encounter (Signed)
Spoke to Daughter Clydie Braun and Rushie Chestnut is getting a partial fill of the Vancomycin for the Patient to start while we wait on the PA for the diffcid. Patient will start the Vancomycin.

## 2023-06-05 NOTE — Telephone Encounter (Signed)
 Noted. It looks like her insurance has an issue with the quantity I sent in. The quantity I sent is appropriate given that this is a recurrence, though given that her insurance will not cover this then we could do the dificid  for 10 days. Please reach out to the family and see how she is doing with her diarrhea while on the vancomycin . If not improving we could try sending the dificid  in again.

## 2023-06-05 NOTE — Telephone Encounter (Signed)
 PA for Dificid denied.  Attached letter for provider review.

## 2023-06-06 ENCOUNTER — Ambulatory Visit (INDEPENDENT_AMBULATORY_CARE_PROVIDER_SITE_OTHER): Payer: Medicare Other | Admitting: Family Medicine

## 2023-06-06 ENCOUNTER — Encounter: Payer: Self-pay | Admitting: Family Medicine

## 2023-06-06 VITALS — BP 122/84 | Temp 96.4°F | Ht 59.0 in | Wt 110.4 lb

## 2023-06-06 DIAGNOSIS — A0472 Enterocolitis due to Clostridium difficile, not specified as recurrent: Secondary | ICD-10-CM

## 2023-06-06 DIAGNOSIS — K810 Acute cholecystitis: Secondary | ICD-10-CM | POA: Diagnosis not present

## 2023-06-06 DIAGNOSIS — S51811A Laceration without foreign body of right forearm, initial encounter: Secondary | ICD-10-CM | POA: Diagnosis not present

## 2023-06-06 DIAGNOSIS — E039 Hypothyroidism, unspecified: Secondary | ICD-10-CM

## 2023-06-06 DIAGNOSIS — D631 Anemia in chronic kidney disease: Secondary | ICD-10-CM | POA: Diagnosis not present

## 2023-06-06 DIAGNOSIS — E785 Hyperlipidemia, unspecified: Secondary | ICD-10-CM | POA: Diagnosis not present

## 2023-06-06 DIAGNOSIS — J309 Allergic rhinitis, unspecified: Secondary | ICD-10-CM | POA: Diagnosis not present

## 2023-06-06 DIAGNOSIS — K222 Esophageal obstruction: Secondary | ICD-10-CM | POA: Diagnosis not present

## 2023-06-06 DIAGNOSIS — I480 Paroxysmal atrial fibrillation: Secondary | ICD-10-CM | POA: Diagnosis not present

## 2023-06-06 DIAGNOSIS — Z8719 Personal history of other diseases of the digestive system: Secondary | ICD-10-CM | POA: Diagnosis not present

## 2023-06-06 DIAGNOSIS — I5032 Chronic diastolic (congestive) heart failure: Secondary | ICD-10-CM | POA: Diagnosis not present

## 2023-06-06 DIAGNOSIS — R197 Diarrhea, unspecified: Secondary | ICD-10-CM

## 2023-06-06 DIAGNOSIS — N1831 Chronic kidney disease, stage 3a: Secondary | ICD-10-CM | POA: Diagnosis not present

## 2023-06-06 DIAGNOSIS — K859 Acute pancreatitis without necrosis or infection, unspecified: Secondary | ICD-10-CM | POA: Diagnosis not present

## 2023-06-06 DIAGNOSIS — K5790 Diverticulosis of intestine, part unspecified, without perforation or abscess without bleeding: Secondary | ICD-10-CM | POA: Diagnosis not present

## 2023-06-06 DIAGNOSIS — F32A Depression, unspecified: Secondary | ICD-10-CM | POA: Diagnosis not present

## 2023-06-06 MED ORDER — FLUTICASONE PROPIONATE 50 MCG/ACT NA SUSP: 2.0000 | Freq: Every day | NASAL | 1 refills | Status: AC

## 2023-06-06 MED ORDER — LEVOTHYROXINE SODIUM 50 MCG PO TABS: 50.0000 ug | ORAL_TABLET | Freq: Every day | ORAL | 0 refills | Status: DC

## 2023-06-06 NOTE — Progress Notes (Signed)
 Camellia Her, MD Phone: [phone]  Kylie May is a 89 y.o. female who presents today for follow-up.  Patient's daughter and grandson provide much of the history.  Communicated with the patient by text on phone given difficulty hearing.  C. difficile/diverticulitis: Patient seen a week ago for abdominal pain and diarrhea.  Patient's family reports she was having minimal abdominal pain at that time.  Patient subsequently had a CT scan that noted possible diverticulitis versus proctocolitis.  Patient subsequently placed on Augmentin  and then placed on vancomycin  for C. difficile once that testing returned.  Initially patient was prescribed Dificid  though her insurance would not pay for the amount prescribed.  Since going on vancomycin  patient and her family reports she has not had any diarrhea.  She notes no abdominal pain.  Minimal nausea.  No blood in her stool.  No fevers.  They do report some difficulty swallowing though that has been an ongoing issue.  Skin tear: Patient's family member notes that she had a fall when she was in the hospital for her imaging and had a skin tear from this.  This occurred 1 week ago.  They report this has been cleansed with soap and water.  Social History   Tobacco Use  Smoking Status Former   Current packs/day: 0.00   Types: Cigarettes   Quit date: 06/04/1951   Years since quitting: 72.0   Passive exposure: Past  Smokeless Tobacco Never  Tobacco Comments   Smoked 1945-1953 , up to 2 cigarettes/day    Current Outpatient Medications on File Prior to Visit  Medication Sig Dispense Refill   acetaminophen  (TYLENOL ) 325 MG tablet Take 2 tablets (650 mg total) by mouth every 6 (six) hours as needed for mild pain (or Fever >/= 101).     amiodarone  (PACERONE ) 200 MG tablet Take 1 tablet (200 mg total) by mouth daily. 90 tablet 3   amoxicillin -clavulanate (AUGMENTIN ) 875-125 MG tablet Take 1 tablet by mouth 2 (two) times daily for 10 days. 20 tablet 0    apixaban  (ELIQUIS ) 2.5 MG TABS tablet Take 1 tablet (2.5 mg total) by mouth 2 (two) times daily. 180 tablet 3   azelastine  (ASTELIN ) 0.1 % nasal spray Place 2 sprays into both nostrils 2 (two) times daily. Use in each nostril as directed 30 mL 12   CRANBERRY PO Take 2 tablets by mouth daily.     famotidine  (PEPCID ) 20 MG tablet Take 20 mg by mouth at bedtime.     ferrous sulfate  325 (65 FE) MG EC tablet Take 325 mg by mouth daily.     hydrocortisone  (ANUSOL -HC) 25 MG suppository Place 1 suppository (25 mg total) rectally 2 (two) times daily as needed for hemorrhoids or anal itching. 14 suppository 1   magnesium  chloride (SLOW-MAG) 64 MG TBEC SR tablet Take 2 tablets (128 mg total) by mouth daily. 60 tablet 3   metoprolol  tartrate (LOPRESSOR ) 25 MG tablet Take 25 mg by mouth 2 (two) times daily.     montelukast  (SINGULAIR ) 10 MG tablet Take 1 tablet (10 mg total) by mouth at bedtime. 90 tablet 1   nystatin  cream (MYCOSTATIN )      omeprazole  (PRILOSEC) 40 MG capsule TAKE ONE CAPSULE BY MOUTH EVERY MORNING AND 1 AT BEDTIME AS DIRECTED 180 capsule 1   ondansetron  (ZOFRAN ) 4 MG tablet Take 1 tablet (4 mg total) by mouth daily as needed for nausea or vomiting. 30 tablet 1   Probiotic Product (PROBIOTIC PO) Take 1 capsule by mouth 2 (  two) times daily.     Sodium Chloride  Flush (NORMAL SALINE FLUSH) 0.9 % SOLN FLUSH ONCE DAILY AS DIRECTED 3215 mL 3   vancomycin  (VANCOCIN ) 125 MG capsule Take 1 capsule (125 mg total) by mouth 4 (four) times daily for 14 days. 56 capsule 0   No current facility-administered medications on file prior to visit.     ROS see history of present illness  Objective  Physical Exam Vitals:   06/06/23 1421  BP: 122/84  Temp: (!) 96.4 F (35.8 C)  SpO2: 96%    BP Readings from Last 3 Encounters:  06/06/23 122/84  05/30/23 118/70  04/04/23 112/70   Wt Readings from Last 3 Encounters:  06/06/23 110 lb 6.4 oz (50.1 kg)  05/30/23 109 lb (49.4 kg)  04/04/23 105 lb  12.8 oz (48 kg)    Physical Exam Constitutional:      General: She is not in acute distress.    Appearance: She is not diaphoretic.  Cardiovascular:     Rate and Rhythm: Normal rate and regular rhythm.     Heart sounds: Normal heart sounds.  Pulmonary:     Effort: Pulmonary effort is normal.  Abdominal:     General: Bowel sounds are normal. There is no distension.     Palpations: Abdomen is soft.     Tenderness: There is no abdominal tenderness.  Skin:    General: Skin is warm and dry.  Neurological:     Mental Status: She is alert.      Assessment/Plan: Please see individual problem list.  C. difficile colitis Assessment & Plan: Patient symptoms have resolved at this time.  Suspect this was a recurrence of her C. difficile.  Discussed it is a little difficult to determine if diverticulitis played a role in her symptoms or if this was all C. difficile.  For now she will complete her course of Augmentin  and vancomycin .  If she has recurrence of diarrhea I would recommend that we try to get Dificid  approved.  She will see GI and a referral has already been placed.  They will let us  know if she has recurrent symptoms.   Allergic rhinitis, unspecified seasonality, unspecified trigger -     Fluticasone  Propionate; Place 2 sprays into both nostrils daily.  Dispense: 16 g; Refill: 1  Diarrhea of presumed infectious origin -     Comprehensive metabolic panel; Future  History of diverticulitis  Hypothyroidism, unspecified type -     Levothyroxine  Sodium; Take 1 tablet (50 mcg total) by mouth daily before breakfast.  Dispense: 90 tablet; Refill: 0  Skin tear of right forearm without complication, initial encounter Assessment & Plan: No signs of infection.  Discussed cleansing with soap and water and applying nonstick bandages to the area.  Advised not to use triple antibiotic ointment on this.  We will refer to wound care for further management and helping this heal.  Advised if it  is healed by the time she gets to see wound care they can cancel the visit.  Orders: -     AMB referral to wound care center     Return for as scheduled.  I have spent 32 minutes in the care of this patient regarding history taking, documentation, review of recent visit and labs, .   Camellia Her, MD [facility]

## 2023-06-06 NOTE — Assessment & Plan Note (Signed)
 No signs of infection.  Discussed cleansing with soap and water and applying nonstick bandages to the area.  Advised not to use triple antibiotic ointment on this.  We will refer to wound care for further management and helping this heal.  Advised if it is healed by the time she gets to see wound care they can cancel the visit.

## 2023-06-06 NOTE — Assessment & Plan Note (Addendum)
 Patient symptoms have resolved at this time.  Suspect this was a recurrence of her C. difficile.  Discussed it is a little difficult to determine if diverticulitis played a role in her symptoms or if this was all C. difficile.  For now she will complete her course of Augmentin  and vancomycin .  If she has recurrence of diarrhea I would recommend that we try to get Dificid  approved.  She will see GI and a referral has already been placed.  They will let us  know if she has recurrent symptoms.

## 2023-06-06 NOTE — Telephone Encounter (Signed)
Noted. Plan to see as scheduled.  

## 2023-06-07 LAB — COMPREHENSIVE METABOLIC PANEL
AG Ratio: 1.2 (calc) (ref 1.0–2.5)
ALT: 19 U/L (ref 6–29)
AST: 19 U/L (ref 10–35)
Albumin: 3.5 g/dL — ABNORMAL LOW (ref 3.6–5.1)
Alkaline phosphatase (APISO): 63 U/L (ref 37–153)
BUN: 12 mg/dL (ref 7–25)
CO2: 23 mmol/L (ref 20–32)
Calcium: 9.4 mg/dL (ref 8.6–10.4)
Chloride: 98 mmol/L (ref 98–110)
Creat: 0.82 mg/dL (ref 0.60–0.95)
Globulin: 3 g/dL (ref 1.9–3.7)
Glucose, Bld: 85 mg/dL (ref 65–99)
Potassium: 4.7 mmol/L (ref 3.5–5.3)
Sodium: 136 mmol/L (ref 135–146)
Total Bilirubin: 0.3 mg/dL (ref 0.2–1.2)
Total Protein: 6.5 g/dL (ref 6.1–8.1)

## 2023-06-10 DIAGNOSIS — I5032 Chronic diastolic (congestive) heart failure: Secondary | ICD-10-CM | POA: Diagnosis not present

## 2023-06-10 DIAGNOSIS — D631 Anemia in chronic kidney disease: Secondary | ICD-10-CM | POA: Diagnosis not present

## 2023-06-10 DIAGNOSIS — A0472 Enterocolitis due to Clostridium difficile, not specified as recurrent: Secondary | ICD-10-CM | POA: Diagnosis not present

## 2023-06-10 DIAGNOSIS — I480 Paroxysmal atrial fibrillation: Secondary | ICD-10-CM | POA: Diagnosis not present

## 2023-06-10 DIAGNOSIS — K859 Acute pancreatitis without necrosis or infection, unspecified: Secondary | ICD-10-CM | POA: Diagnosis not present

## 2023-06-10 DIAGNOSIS — N1831 Chronic kidney disease, stage 3a: Secondary | ICD-10-CM | POA: Diagnosis not present

## 2023-06-15 ENCOUNTER — Other Ambulatory Visit: Payer: Self-pay | Admitting: Family Medicine

## 2023-06-15 DIAGNOSIS — J309 Allergic rhinitis, unspecified: Secondary | ICD-10-CM

## 2023-06-16 ENCOUNTER — Other Ambulatory Visit: Payer: Self-pay | Admitting: Interventional Radiology

## 2023-06-16 DIAGNOSIS — I480 Paroxysmal atrial fibrillation: Secondary | ICD-10-CM | POA: Diagnosis not present

## 2023-06-16 DIAGNOSIS — K5790 Diverticulosis of intestine, part unspecified, without perforation or abscess without bleeding: Secondary | ICD-10-CM | POA: Diagnosis not present

## 2023-06-16 DIAGNOSIS — Z8701 Personal history of pneumonia (recurrent): Secondary | ICD-10-CM | POA: Diagnosis not present

## 2023-06-16 DIAGNOSIS — D631 Anemia in chronic kidney disease: Secondary | ICD-10-CM | POA: Diagnosis not present

## 2023-06-16 DIAGNOSIS — Z87891 Personal history of nicotine dependence: Secondary | ICD-10-CM | POA: Diagnosis not present

## 2023-06-16 DIAGNOSIS — E039 Hypothyroidism, unspecified: Secondary | ICD-10-CM | POA: Diagnosis not present

## 2023-06-16 DIAGNOSIS — I5032 Chronic diastolic (congestive) heart failure: Secondary | ICD-10-CM | POA: Diagnosis not present

## 2023-06-16 DIAGNOSIS — K222 Esophageal obstruction: Secondary | ICD-10-CM | POA: Diagnosis not present

## 2023-06-16 DIAGNOSIS — K810 Acute cholecystitis: Secondary | ICD-10-CM

## 2023-06-16 DIAGNOSIS — F32A Depression, unspecified: Secondary | ICD-10-CM | POA: Diagnosis not present

## 2023-06-16 DIAGNOSIS — E785 Hyperlipidemia, unspecified: Secondary | ICD-10-CM | POA: Diagnosis not present

## 2023-06-16 DIAGNOSIS — A0472 Enterocolitis due to Clostridium difficile, not specified as recurrent: Secondary | ICD-10-CM | POA: Diagnosis not present

## 2023-06-16 DIAGNOSIS — K859 Acute pancreatitis without necrosis or infection, unspecified: Secondary | ICD-10-CM | POA: Diagnosis not present

## 2023-06-16 DIAGNOSIS — N1831 Chronic kidney disease, stage 3a: Secondary | ICD-10-CM | POA: Diagnosis not present

## 2023-06-20 ENCOUNTER — Ambulatory Visit
Admission: RE | Admit: 2023-06-20 | Discharge: 2023-06-20 | Disposition: A | Discharge status: Home / Self Care | Payer: Medicare Other | Source: Ambulatory Visit | Attending: Interventional Radiology | Admitting: Interventional Radiology

## 2023-06-20 DIAGNOSIS — T85628A Displacement of other specified internal prosthetic devices, implants and grafts, initial encounter: Secondary | ICD-10-CM | POA: Diagnosis not present

## 2023-06-20 DIAGNOSIS — K810 Acute cholecystitis: Secondary | ICD-10-CM | POA: Diagnosis present

## 2023-06-20 HISTORY — PX: IR EXCHANGE BILIARY DRAIN: IMG6046

## 2023-06-20 MED ORDER — LIDOCAINE VISCOUS HCL 2 % MT SOLN
15.0000 mL | Freq: Once | OROMUCOSAL | Status: AC
  Administered 2023-06-20: 2 mL via OROMUCOSAL

## 2023-06-20 MED ORDER — LIDOCAINE HCL 1 % IJ SOLN: 10.0000 mL | Freq: Once | INTRAMUSCULAR | Status: DC

## 2023-06-20 MED ORDER — IOHEXOL 300 MG/ML  SOLN
50.0000 mL | Freq: Once | INTRAMUSCULAR | Status: AC | PRN
  Administered 2023-06-20: 2 mL

## 2023-06-20 MED ORDER — LIDOCAINE HCL 1 % IJ SOLN
INTRAMUSCULAR | Status: AC
  Filled 2023-06-20: qty 20

## 2023-06-20 MED ORDER — LIDOCAINE VISCOUS HCL 2 % MT SOLN
OROMUCOSAL | Status: AC
  Filled 2023-06-20: qty 15

## 2023-06-24 ENCOUNTER — Ambulatory Visit: Payer: Medicare Other | Admitting: Radiology

## 2023-06-25 DIAGNOSIS — I480 Paroxysmal atrial fibrillation: Secondary | ICD-10-CM | POA: Diagnosis not present

## 2023-06-25 DIAGNOSIS — D631 Anemia in chronic kidney disease: Secondary | ICD-10-CM | POA: Diagnosis not present

## 2023-06-25 DIAGNOSIS — A0472 Enterocolitis due to Clostridium difficile, not specified as recurrent: Secondary | ICD-10-CM | POA: Diagnosis not present

## 2023-06-25 DIAGNOSIS — N1831 Chronic kidney disease, stage 3a: Secondary | ICD-10-CM | POA: Diagnosis not present

## 2023-06-25 DIAGNOSIS — K859 Acute pancreatitis without necrosis or infection, unspecified: Secondary | ICD-10-CM | POA: Diagnosis not present

## 2023-06-25 DIAGNOSIS — I5032 Chronic diastolic (congestive) heart failure: Secondary | ICD-10-CM | POA: Diagnosis not present

## 2023-06-26 ENCOUNTER — Ambulatory Visit: Payer: Medicare Other | Admitting: Nurse Practitioner

## 2023-06-26 VITALS — BP 124/82 | HR 76 | Temp 97.8°F | Ht 59.0 in | Wt 105.6 lb

## 2023-06-26 DIAGNOSIS — I480 Paroxysmal atrial fibrillation: Secondary | ICD-10-CM | POA: Diagnosis not present

## 2023-06-26 DIAGNOSIS — S51811D Laceration without foreign body of right forearm, subsequent encounter: Secondary | ICD-10-CM

## 2023-06-26 DIAGNOSIS — A0472 Enterocolitis due to Clostridium difficile, not specified as recurrent: Secondary | ICD-10-CM

## 2023-06-26 DIAGNOSIS — K819 Cholecystitis, unspecified: Secondary | ICD-10-CM | POA: Diagnosis not present

## 2023-06-26 DIAGNOSIS — J309 Allergic rhinitis, unspecified: Secondary | ICD-10-CM

## 2023-06-26 DIAGNOSIS — R1319 Other dysphagia: Secondary | ICD-10-CM

## 2023-06-26 DIAGNOSIS — E039 Hypothyroidism, unspecified: Secondary | ICD-10-CM

## 2023-06-26 NOTE — Progress Notes (Signed)
Bethanie Dicker, NP-C Phone: [phone]  Kylie May is a 89 y.o. female who presents today for transfer of care. Patient's daughter provided majority of the history. Communicated with the patient by text on phone and writing on paper given difficulty hearing.   Discussed the use of AI scribe software for clinical note transcription with the patient and daughter, who gave verbal consent to proceed.  History of Present Illness   The patient, with a history of atrial fibrillation, thyroid disease, and recent gastrointestinal issues, presents for a transfer of care. The patient's daughter, who is the primary caregiver, reports recent removal of a gallbladder valve tube due to the patient's actions leading to its dislodgement. The gallbladder, still containing stones, is reportedly functioning somewhat normally. The patient's history includes a septic episode in 2020 due to lodged gallstones, which led to the placement of a bypass tube and bag. The patient expresses concern about the absence of the tube, despite previous complaints about its presence.  The patient also reports difficulty swallowing, a long-standing issue attributed to a small throat opening. This problem seems to have worsened over time and is particularly noticeable when taking medications. The patient's diet is reportedly high in fat, which is a concern given the recent gallbladder issues. The patient has lost approximately seven pounds recently, and there is difficulty finding foods that the patient is willing to eat.  The patient has a skin wound on the arm from a previous fall, which is healing and crusted over. No wound care is currently being sought for this issue. The patient also reports occasional throat discomfort and coughing, which may be due to mucus or drainage. The patient's medications, including levothyroxine for thyroid disease and Eliquis and amiodarone for atrial fibrillation, are managed by the caregivers. The  patient is scheduled to see a gastroenterologist for further evaluation of the gastrointestinal issues, including recurrent C. Diff, diverticulitis, and swallowing difficulties.      Social History   Tobacco Use  Smoking Status Former   Current packs/day: 0.00   Types: Cigarettes   Quit date: 06/04/1951   Years since quitting: 72.1   Passive exposure: Past  Smokeless Tobacco Never  Tobacco Comments   Smoked 1945-1953 , up to 2 cigarettes/day    Current Outpatient Medications on File Prior to Visit  Medication Sig Dispense Refill   acetaminophen (TYLENOL) 325 MG tablet Take 2 tablets (650 mg total) by mouth every 6 (six) hours as needed for mild pain (or Fever >/= 101).     amiodarone (PACERONE) 200 MG tablet Take 1 tablet (200 mg total) by mouth daily. 90 tablet 3   apixaban (ELIQUIS) 2.5 MG TABS tablet Take 1 tablet (2.5 mg total) by mouth 2 (two) times daily. 180 tablet 3   azelastine (ASTELIN) 0.1 % nasal spray Place 2 sprays into both nostrils 2 (two) times daily. Use in each nostril as directed 30 mL 12   CRANBERRY PO Take 2 tablets by mouth daily.     famotidine (PEPCID) 20 MG tablet Take 20 mg by mouth at bedtime.     ferrous sulfate 325 (65 FE) MG EC tablet Take 325 mg by mouth daily.     fluticasone (FLONASE) 50 MCG/ACT nasal spray Place 2 sprays into both nostrils daily. 16 g 1   hydrocortisone (ANUSOL-HC) 25 MG suppository Place 1 suppository (25 mg total) rectally 2 (two) times daily as needed for hemorrhoids or anal itching. 14 suppository 1   levothyroxine (SYNTHROID) 50 MCG tablet Take  1 tablet (50 mcg total) by mouth daily before breakfast. 90 tablet 0   magnesium chloride (SLOW-MAG) 64 MG TBEC SR tablet Take 2 tablets (128 mg total) by mouth daily. 60 tablet 3   metoprolol tartrate (LOPRESSOR) 25 MG tablet Take 25 mg by mouth 2 (two) times daily. (Patient not taking: Reported on 06/26/2023)     montelukast (SINGULAIR) 10 MG tablet TAKE 1 TABLET(10 MG) BY MOUTH AT BEDTIME  30 tablet 3   nystatin cream (MYCOSTATIN)      omeprazole (PRILOSEC) 40 MG capsule TAKE ONE CAPSULE BY MOUTH EVERY MORNING AND 1 AT BEDTIME AS DIRECTED 180 capsule 1   ondansetron (ZOFRAN) 4 MG tablet Take 1 tablet (4 mg total) by mouth daily as needed for nausea or vomiting. 30 tablet 1   Probiotic Product (PROBIOTIC PO) Take 1 capsule by mouth 2 (two) times daily.     Sodium Chloride Flush (NORMAL SALINE FLUSH) 0.9 % SOLN FLUSH ONCE DAILY AS DIRECTED 3215 mL 3   No current facility-administered medications on file prior to visit.    ROS see history of present illness  Objective  Physical Exam Vitals:   06/26/23 1416  BP: 124/82  Pulse: 76  Temp: 97.8 F (36.6 C)  SpO2: 97%    BP Readings from Last 3 Encounters:  06/26/23 124/82  06/06/23 122/84  05/30/23 118/70   Wt Readings from Last 3 Encounters:  06/26/23 105 lb 9.6 oz (47.9 kg)  06/06/23 110 lb 6.4 oz (50.1 kg)  05/30/23 109 lb (49.4 kg)    Physical Exam Constitutional:      General: She is not in acute distress.    Appearance: Normal appearance.  HENT:     Head: Normocephalic.  Cardiovascular:     Rate and Rhythm: Normal rate and regular rhythm.     Heart sounds: Normal heart sounds.  Pulmonary:     Effort: Pulmonary effort is normal.     Breath sounds: Normal breath sounds.  Skin:    General: Skin is warm and dry.     Findings: Wound (right forearm, well healing, scabbed over) present.  Neurological:     General: No focal deficit present.     Mental Status: She is alert.  Psychiatric:        Mood and Affect: Mood normal.        Behavior: Behavior normal.    Assessment/Plan: Please see individual problem list.  Cholecystitis Assessment & Plan: Chronic drain was recently removed after being pulled out. The gallbladder still contains stones but is functioning. There is concern about the absence of the tube. Plan to discuss with a GI specialist at appointment in February. Encouraged low fat diet.     C. difficile colitis Assessment & Plan: No current symptoms of diarrhea. Continue the current treatment plan and discuss further with the GI specialist at the upcoming appointment.   Esophageal dysphagia Assessment & Plan: Chronic issue. There is difficulty swallowing, especially with medications and liquids. Plan to discuss with the GI specialist at the upcoming appointment. Consider diet modifications and possibly thickening liquids. Continue Omeprazole 40mg  twice daily.    Skin tear of right forearm without complication, subsequent encounter Assessment & Plan: The wound is healing well with no signs of infection or pain. Continue to clean with soap water. Advised not to use any ointments or creams. Discussed and mutually agreed to cancel the wound care appointment. They will contact if the area does not continue to heal.    Hypothyroidism,  unspecified type Assessment & Plan: Continue taking levothyroxine daily.   Paroxysmal atrial fibrillation (HCC) Assessment & Plan: Continue taking Eliquis 2.5mg  BID and amiodarone 200mg  daily with regular follow-ups with cardiology.    Return in about 6 months (around 12/24/2023) for Follow up, sooner as needed.   Bethanie Dicker, NP-C [facility]

## 2023-07-03 ENCOUNTER — Encounter: Payer: Self-pay | Admitting: Nurse Practitioner

## 2023-07-03 NOTE — Assessment & Plan Note (Signed)
Continue taking levothyroxine 50 mcg daily

## 2023-07-03 NOTE — Assessment & Plan Note (Signed)
Chronic issue. There is difficulty swallowing, especially with medications and liquids. Plan to discuss with the GI specialist at the upcoming appointment. Consider diet modifications and possibly thickening liquids. Continue Omeprazole 40mg  twice daily.

## 2023-07-03 NOTE — Assessment & Plan Note (Signed)
The wound is healing well with no signs of infection or pain. Continue to clean with soap water. Advised not to use any ointments or creams. Discussed and mutually agreed to cancel the wound care appointment. They will contact if the area does not continue to heal.

## 2023-07-03 NOTE — Assessment & Plan Note (Signed)
Continue taking Eliquis 2.5mg  BID and amiodarone 200mg  daily with regular follow-ups with cardiology.

## 2023-07-03 NOTE — Assessment & Plan Note (Signed)
Chronic drain was recently removed after being pulled out. The gallbladder still contains stones but is functioning. There is concern about the absence of the tube. Plan to discuss with a GI specialist at appointment in February. Encouraged low fat diet.

## 2023-07-03 NOTE — Assessment & Plan Note (Signed)
No current symptoms of diarrhea. Continue the current treatment plan and discuss further with the GI specialist at the upcoming appointment.

## 2023-07-09 ENCOUNTER — Other Ambulatory Visit: Payer: Self-pay

## 2023-07-09 ENCOUNTER — Ambulatory Visit: Payer: Self-pay | Admitting: Nurse Practitioner

## 2023-07-09 ENCOUNTER — Encounter: Payer: Self-pay | Admitting: Nurse Practitioner

## 2023-07-09 ENCOUNTER — Telehealth: Payer: Self-pay

## 2023-07-09 DIAGNOSIS — E039 Hypothyroidism, unspecified: Secondary | ICD-10-CM

## 2023-07-09 DIAGNOSIS — R197 Diarrhea, unspecified: Secondary | ICD-10-CM

## 2023-07-09 MED ORDER — LEVOTHYROXINE SODIUM 50 MCG PO TABS: 50.0000 ug | ORAL_TABLET | Freq: Every day | ORAL | 3 refills | Status: DC

## 2023-07-09 NOTE — Telephone Encounter (Signed)
 Called daughter see 1st phone note from triage nurse

## 2023-07-09 NOTE — Telephone Encounter (Signed)
 Kylie May from [facility] [facility] called to state she recommended patient go to ED, but patient declined.  Jillian Mott states she is sending a message to clinical pool, but wanted to let us  know as well.

## 2023-07-09 NOTE — Telephone Encounter (Signed)
 Called and spoke with daughter Kylie May   She believes mother has C-diff again opt has a Hx of getting C-diff Dr. Maribeth sent in medication that helped at the time he originally prescribed something else that was not covered and Kylie May stated he had to switched it to one she had previously.   Pt is having discomfort and diarrhea since Sunday. Kylie May stated she does not want to take pt to the hospital. She stated Dr. Lollie told her that it may be approved with less pills but she didn't need it at the time.   Denies pt having: any other Sx's   She has an appt next Wednesday with GI    Kylie May does not want to go to the ED. Kylie May would like for Leron to speak with Dr. Lollie as he is aware of her history.   Daughter speaks about difficulty collecting sample if sample is needed but will try.

## 2023-07-09 NOTE — Telephone Encounter (Signed)
 Chief Complaint: Diarrhea Symptoms: Smell of c diff Frequency: Not sure Pertinent Negatives: Patient denies bloody stool Disposition: [] ED /[] Urgent Care (no appt availability in office) / [] Appointment(In office/virtual)/ []  [place] [facility]/ [] Home Care/ [x] Refused Recommended Disposition /[] [facility]/ []  Follow-up with PCP Additional Notes: Spoke with pt's daughter, Darice. Pt recently had c diff at end of Dec- pt was on vancomycin . Difficulty getting pt to drink fluids. Pt advised to go to ED. Pt daughter refused at this time. High priority message sent to PCP for review.   Reason for Disposition  [1] Drinking very little AND [2] dehydration suspected (e.g., no urine > 12 hours, very dry mouth, very lightheaded)  Answer Assessment - Initial Assessment Questions 1. DIARRHEA SEVERITY: How bad is the diarrhea? How many more stools have you had in the past 24 hours than normal?    - NO DIARRHEA (SCALE 0)   - MILD (SCALE 1-3): Few loose or mushy BMs; increase of 1-3 stools over normal daily number of stools; mild increase in ostomy output.   -  MODERATE (SCALE 4-7): Increase of 4-6 stools daily over normal; moderate increase in ostomy output.   -  SEVERE (SCALE 8-10; OR WORST POSSIBLE): Increase of 7 or more stools daily over normal; moderate increase in ostomy output; incontinence. Not sure  2. ANTIBIOTIC USE: Are you taking antibiotics now or have you taken antibiotics in the past 2 months?       Yes, took vancomycin  at end of Dec for c diff, seemed to clear it up. Told to make an appt with GI (appt scheduled next Wed)  Protocols used: [facility]

## 2023-07-10 ENCOUNTER — Other Ambulatory Visit
Admission: RE | Admit: 2023-07-10 | Discharge: 2023-07-10 | Disposition: A | Discharge status: Home / Self Care | Payer: Medicare Other | Source: Ambulatory Visit | Attending: Nurse Practitioner | Admitting: Nurse Practitioner

## 2023-07-10 ENCOUNTER — Other Ambulatory Visit: Payer: Self-pay | Admitting: Nurse Practitioner

## 2023-07-10 DIAGNOSIS — R197 Diarrhea, unspecified: Secondary | ICD-10-CM | POA: Insufficient documentation

## 2023-07-10 DIAGNOSIS — A0472 Enterocolitis due to Clostridium difficile, not specified as recurrent: Secondary | ICD-10-CM

## 2023-07-10 LAB — CLOSTRIDIUM DIFFICILE BY PCR, REFLEXED: Toxigenic C. Difficile by PCR: POSITIVE — AB

## 2023-07-10 LAB — C DIFFICILE QUICK SCREEN W PCR REFLEX
C Diff antigen: NEGATIVE
C Diff toxin: POSITIVE — AB

## 2023-07-10 MED ORDER — VANCOMYCIN HCL 125 MG PO CAPS: 125.0000 mg | ORAL_CAPSULE | Freq: Four times a day (QID) | ORAL | 0 refills | Status: AC

## 2023-07-10 NOTE — Telephone Encounter (Signed)
 Copied from CRM [id number]. Topic: General - Other >> Jul 10, 2023  9:16 AM Adonis Hoot wrote: Reason for CRM: [phone] Mariah Shines the daughter returned call to [place].She would like a return call.

## 2023-07-10 NOTE — Telephone Encounter (Signed)
Vm left to call back.  ?

## 2023-07-10 NOTE — Addendum Note (Signed)
 Addended by: Matthews Franks C on: 07/10/2023 02:06 PM   Modules accepted: Orders

## 2023-07-10 NOTE — Telephone Encounter (Signed)
 Spoke with karen pts daughter and informed her of what provider stated Mariah Shines stated she will come pick up the kit and that pt is staying hydrated. Kit has been placed up front for her pick up and order placed for c-diff fecal

## 2023-07-10 NOTE — Addendum Note (Signed)
 Addended by: Jackline Maser on: 07/10/2023 10:15 AM   Modules accepted: Orders

## 2023-07-11 ENCOUNTER — Telehealth: Payer: Self-pay

## 2023-07-11 DIAGNOSIS — I5032 Chronic diastolic (congestive) heart failure: Secondary | ICD-10-CM | POA: Diagnosis not present

## 2023-07-11 DIAGNOSIS — I480 Paroxysmal atrial fibrillation: Secondary | ICD-10-CM | POA: Diagnosis not present

## 2023-07-11 DIAGNOSIS — D631 Anemia in chronic kidney disease: Secondary | ICD-10-CM | POA: Diagnosis not present

## 2023-07-11 DIAGNOSIS — A0472 Enterocolitis due to Clostridium difficile, not specified as recurrent: Secondary | ICD-10-CM | POA: Diagnosis not present

## 2023-07-11 DIAGNOSIS — N1831 Chronic kidney disease, stage 3a: Secondary | ICD-10-CM | POA: Diagnosis not present

## 2023-07-11 DIAGNOSIS — K859 Acute pancreatitis without necrosis or infection, unspecified: Secondary | ICD-10-CM | POA: Diagnosis not present

## 2023-07-11 NOTE — Telephone Encounter (Signed)
 Verbal Order given to [place] with permission of Bluford Burkitt, NP

## 2023-07-11 NOTE — Telephone Encounter (Signed)
 Copied from CRM [id number]. Topic: Clinical - Home Health Verbal Orders >> Jul 11, 2023  3:49 PM Brittany M wrote: Caller/Agency: [facility] Callback Number: [phone] Service Requested: Physical Therapy Frequency: 1 time a week for 8 weeks Any new concerns about the patient? No Wanting to re certify for physical therapy

## 2023-07-15 ENCOUNTER — Ambulatory Visit: Payer: Medicare Other | Admitting: Physician Assistant

## 2023-07-15 NOTE — Progress Notes (Unsigned)
Celso Amy, PA-C [address]  [address]  [place], [place] [address]  Main: [phone]  Fax: [phone]   Primary Care Physician: Bethanie Dicker, NP  Primary Gastroenterologist:  Celso Amy, PA-C / Dr. Lannette Donath    CC: F/U C. Difficile colitis, diverticulitis, cholelithiasis  HPI: Kylie May is a 89 y.o. female, established patient of Dr. Allegra Lai, presents for follow-up of recurrent C. difficile colitis.  Here today with her daughter Kylie May and Kylie May who are providing patient's history.  Patient is wheelchair dependent and hearing impaired.  She was admitted to [facility] 03/10/2023 -03/12/2023 for C. difficile diarrhea (first occurrence), sepsis, community-acquired pneumonia, CHF.  Diarrhea started after she was on antibiotic for dental infection.  Elevated white count 19.7.  GFR 52.  C. difficile test positive initially treated with Zosyn and then switched to vancomycin x 10 days..  Diarrhea improved.  Allergic to IV Flagyl.  GI pathogen panel negative for all other infections.   05/30/2023 CT abdomen and pelvis with contrast showed distal rectosigmoid colitis versus diverticulitis.  Treated with Augmentin.  There was indwelling percutaneous cholecystostomy tube.  Stable cholelithiasis without evidence of acute cholecystitis.  Hiatal hernia.  05/30/2023: C. difficile positive.  Rx for Dificid was sent 06/02/2023 -denied by insurance.  Switched to vancomycin.  07/10/2023: C. difficile positive treated with vancomycin.  Her diarrhea stopped 2 days ago.  Currently she is not having any more diarrhea at present.  She has had interventional radiology replace her cholecystostomy tube drain several times in the past 5 years.  Her drain fell out 1 month ago.  Patient is not currently having any abdominal pain, nausea, or vomiting.  GI History: She last saw Dr. Allegra Lai 07/2021 for irregular bowel habits, regurgitation, abdominal bloating, symptomatic hemorrhoids, failure to thrive.   Was continued on omeprazole 40 Mg daily and Pepcid 20 Mg nightly for GERD.  Repeat EGD was not recommended.  Was started on MiraLAX 1 capful daily for constipation.  Prescribed Anusol cream and suppositories as needed for hemorrhoids.  Hemorrhoid ligation or hemorrhoidectomy were not recommended given her age and comorbidities.  She was started on Zyprexa 2.5 Mg nightly to help with severe protein calorie malnutrition and appetite.  History of A-fib on Eliquis, medium size hiatal hernia, significant hearing loss with history of acute calculus cholecystitis, recurrent choledocholithiasis, complicated by ascending cholangitis, septic shock, emergent cholecystostomy drain placement in 08/2018 at [facility], followed by ERCP with biliary sphincterotomy and stone extraction, PD stent placement, s/p removal in 11/2018, cholecystostomy drain in place, had COVID in 05/2021, s/p fall in 05/2021 resulting in significant impairment in mobility, wheelchair-bound.     Medical history significant of hyperlipidemia, diastolic CHF, hypothyroidism, depression, CKD-3A, anemia, A-fib on Eliquis, diverticulitis, pancreatitis, esophageal stricture, cholecystitis with biliary drainage in place.   Current Outpatient Medications  Medication Sig Dispense Refill   acetaminophen (TYLENOL) 325 MG tablet Take 2 tablets (650 mg total) by mouth every 6 (six) hours as needed for mild pain (or Fever >/= 101).     amiodarone (PACERONE) 200 MG tablet Take 1 tablet (200 mg total) by mouth daily. 90 tablet 3   apixaban (ELIQUIS) 2.5 MG TABS tablet Take 1 tablet (2.5 mg total) by mouth 2 (two) times daily. 180 tablet 3   azelastine (ASTELIN) 0.1 % nasal spray Place 2 sprays into both nostrils 2 (two) times daily. Use in each nostril as directed 30 mL 12   CRANBERRY PO Take 2 tablets by mouth daily.  famotidine (PEPCID) 20 MG tablet Take 20 mg by mouth at bedtime.     ferrous sulfate 325 (65 FE) MG EC tablet Take 325 mg by mouth daily.      fidaxomicin (DIFICID) 200 MG TABS tablet Take 1 tablet (200 mg total) by mouth 2 (two) times daily for 14 days. 28 tablet 0   fluticasone (FLONASE) 50 MCG/ACT nasal spray Place 2 sprays into both nostrils daily. 16 g 1   hydrocortisone (ANUSOL-HC) 25 MG suppository Place 1 suppository (25 mg total) rectally 2 (two) times daily as needed for hemorrhoids or anal itching. 14 suppository 1   levothyroxine (SYNTHROID) 50 MCG tablet Take 1 tablet (50 mcg total) by mouth daily before breakfast. 90 tablet 3   magnesium chloride (SLOW-MAG) 64 MG TBEC SR tablet Take 2 tablets (128 mg total) by mouth daily. 60 tablet 3   metoprolol tartrate (LOPRESSOR) 25 MG tablet Take 25 mg by mouth 2 (two) times daily.     montelukast (SINGULAIR) 10 MG tablet TAKE 1 TABLET(10 MG) BY MOUTH AT BEDTIME 30 tablet 3   nystatin cream (MYCOSTATIN)      omeprazole (PRILOSEC) 40 MG capsule TAKE ONE CAPSULE BY MOUTH EVERY MORNING AND 1 AT BEDTIME AS DIRECTED 180 capsule 1   ondansetron (ZOFRAN) 4 MG tablet Take 1 tablet (4 mg total) by mouth daily as needed for nausea or vomiting. 30 tablet 1   Probiotic Product (PROBIOTIC PO) Take 1 capsule by mouth 2 (two) times daily.     Sodium Chloride Flush (NORMAL SALINE FLUSH) 0.9 % SOLN FLUSH ONCE DAILY AS DIRECTED 3215 mL 3   vancomycin (VANCOCIN) 125 MG capsule Take 1 capsule (125 mg total) by mouth 4 (four) times daily for 14 days. 56 capsule 0   No current facility-administered medications for this visit.    Allergies as of 07/16/2023 - Review Complete 07/16/2023  Allergen Reaction Noted   Metronidazole  11/11/2006   Antihistamines, loratadine-type Rash 05/30/2023   Penicillin g Rash 08/11/2015    Past Medical History:  Diagnosis Date   A-fib (HCC)    Acid reflux disease    Arrhythmia    Atrial fibrillation with RVR (HCC) 08/15/2018   Chronic diastolic CHF (congestive heart failure) (HCC)    Compression fracture of lumbar vertebra (HCC)    Diverticulitis 01-2008   GI  [place]   Diverticulosis    Esophageal stricture 2006   Femoral bruit    Right   Fibrocystic breast disease    Hiatal hernia    History of esophageal stricture 06/14/2004   2006 esophageal dilation Schatzki's ring    History of skin cancer 12/29/2008   Basal cell cancer right glabella 07/10/2010 Dr. Adolphus Birchwood, [facility] @ [place] , [place]    Hypercholesterolemia    [place] study LDL goal = < 160   Mitral regurgitation    a. 06/2014 EF 55-60%, elevated end-diastolic pressures, dilated LA at 4.3 cm, mildly dilated RA, severe mitral regurgitation, mild aortic sclerosis without stenosis, mod-severe TR   Mitral valve prolapse    Osteoporosis    Dr Marcelle Overlie   Pancreatitis 07-2008   Hospitalized    Patella fracture 05/13/2018   Schatzki's ring    Sepsis (HCC) 08/15/2018   Skin cancer    facial x 2. Dr Adolphus Birchwood    Past Surgical History:  Procedure Laterality Date   BILIARY STENT PLACEMENT  08/21/2018   Procedure: BILIARY STENT PLACEMENT;  Surgeon: Kerin Salen, MD;  Location[facility] ENDOSCOPY;  Service: Gastroenterology;;  COLONOSCOPY  2006   Epidural steroids     x1   ERCP N/A 08/21/2018   Procedure: ENDOSCOPIC RETROGRADE CHOLANGIOPANCREATOGRAPHY (ERCP);  Surgeon: Kerin Salen, MD;  Location: [facility] ENDOSCOPY;  Service: Gastroenterology;  Laterality: N/A;   ESOPHAGEAL DILATION  2006   ESOPHAGOGASTRODUODENOSCOPY (EGD) WITH PROPOFOL N/A 11/16/2018   Procedure: ESOPHAGOGASTRODUODENOSCOPY (EGD) WITH PROPOFOL;  Surgeon: Toney Reil, MD;  Location: [facility] ENDOSCOPY;  Service: Gastroenterology;  Laterality: N/A;   G3 P4 (twins)     IR CHOLANGIOGRAM EXISTING TUBE  08/18/2018   IR CHOLANGIOGRAM EXISTING TUBE  07/16/2022   IR CHOLANGIOGRAM EXISTING TUBE  04/28/2023   IR EXCHANGE BILIARY DRAIN  11/03/2018   IR EXCHANGE BILIARY DRAIN  12/01/2018   IR EXCHANGE BILIARY DRAIN  05/13/2019   IR EXCHANGE BILIARY DRAIN  06/14/2019   IR EXCHANGE BILIARY DRAIN  07/19/2019   IR EXCHANGE BILIARY DRAIN  08/17/2019   IR  EXCHANGE BILIARY DRAIN  09/30/2019   IR EXCHANGE BILIARY DRAIN  12/08/2019   IR EXCHANGE BILIARY DRAIN  01/19/2020   IR EXCHANGE BILIARY DRAIN  03/01/2020   IR EXCHANGE BILIARY DRAIN  04/20/2020   IR EXCHANGE BILIARY DRAIN  07/06/2020   IR EXCHANGE BILIARY DRAIN  10/26/2020   IR EXCHANGE BILIARY DRAIN  01/18/2021   IR EXCHANGE BILIARY DRAIN  03/05/2021   IR EXCHANGE BILIARY DRAIN  05/25/2021   IR EXCHANGE BILIARY DRAIN  08/03/2021   IR EXCHANGE BILIARY DRAIN  09/28/2021   IR EXCHANGE BILIARY DRAIN  11/23/2021   IR EXCHANGE BILIARY DRAIN  12/27/2021   IR EXCHANGE BILIARY DRAIN  02/20/2022   IR EXCHANGE BILIARY DRAIN  04/17/2022   IR EXCHANGE BILIARY DRAIN  06/17/2022   IR EXCHANGE BILIARY DRAIN  07/12/2022   IR EXCHANGE BILIARY DRAIN  08/12/2022   IR EXCHANGE BILIARY DRAIN  09/20/2022   IR EXCHANGE BILIARY DRAIN  11/01/2022   IR EXCHANGE BILIARY DRAIN  12/31/2022   IR EXCHANGE BILIARY DRAIN  02/25/2023   IR EXCHANGE BILIARY DRAIN  03/12/2023   IR EXCHANGE BILIARY DRAIN  03/25/2023   IR EXCHANGE BILIARY DRAIN  05/12/2023   IR EXCHANGE BILIARY DRAIN  06/20/2023   IR PERC CHOLECYSTOSTOMY  08/16/2018   IR RADIOLOGIST EVAL & MGMT  11/08/2021   IR RADIOLOGIST EVAL & MGMT  01/10/2023   REMOVAL OF STONES  08/21/2018   Procedure: REMOVAL OF STONES;  Surgeon: Kerin Salen, MD;  Location: [facility] ENDOSCOPY;  Service: Gastroenterology;;   SKIN CANCER EXCISION     SPHINCTEROTOMY  08/21/2018   Procedure: Dennison Mascot;  Surgeon: Kerin Salen, MD;  [facility] ENDOSCOPY;  Service: Gastroenterology;;    Review of Systems:    All systems reviewed and negative except where noted in HPI.   Physical Examination:   BP 135/67   Pulse 79   Temp 98 F (36.7 C)   Ht 4\' 11"  (1.499 m)   Wt 105 lb (47.6 kg)   BMI 21.21 kg/m   General: Feeble, elderly, frail female sitting in a wheelchair, in no acute distress. She looks comfortable. Lungs: Clear to auscultation bilaterally. Non-labored. Heart: Regular rate and rhythm, no murmurs  rubs or gallops.  Abdomen: Bowel sounds are normal; Abdomen is Soft; No hepatosplenomegaly, masses or hernias;  No Abdominal Tenderness; No guarding or rebound tenderness. Neuro: Alert.  Not able to communicate.  Hearing impaired.  Imaging Studies: IR EXCHANGE BILIARY DRAIN Result Date: 06/20/2023 INDICATION: 89 year old female with a history of choledocholithiasis. She had developed significant sepsis and was  a poor candidate for ERCP. Therefore, a percutaneous cholecystostomy tube was placed in March of 2020. The patient has since undergone numerous cholecystostomy tube exchanges most recently on 05/12/2023. Her last several cholecystostomy tubes demonstrate a completely decompressed gallbladder and widely patent cystic and common bile ducts with a widely patent ampulla. Today, she presents with her tube completely displaced. It has been displaced for an uncertain amount of time. Attempted replacement was attempted. EXAM: Attempted cholecystostomy tube exchange MEDICATIONS: None. ANESTHESIA/SEDATION: None. FLUOROSCOPY: Radiation Exposure Index (as provided by the fluoroscopic device): 1.4 mGy Kerma COMPLICATIONS: None immediate. PROCEDURE: Informed written consent was obtained from the patient after a thorough discussion of the procedural risks, benefits and alternatives. All questions were addressed. Maximal Sterile Barrier Technique was utilized including caps, mask, sterile gowns, sterile gloves, sterile drape, hand hygiene and skin antiseptic. A timeout was performed prior to the initiation of the procedure. The percutaneous tract is nearly completely healed over. The tip of a 5 French catheter was advanced into the tract. The catheter could not be advanced further than 1 mm into the tract. Contrast injection results in contrast just spilling back onto the skin surface. Therefore, a thin 4 [place] dilator was advanced into the track and manipulated in an effort to recanalized the tract. Additional  contrast injections were performed resulting in contrast deposition in the subcutaneous fat. However, no tract could be identified leading back into the gallbladder. Therefore, the procedure was terminated. IMPRESSION: Displaced cholecystostomy tube with interval healing of the tract. No attempt was made at placement of a de Novo cholecystostomy tube given the recent history of widely patent cystic and common bile ducts in the setting of a collapsed/decompressed gallbladder. Findings were discussed with the patient and her daughter at length. If she were to ever developed recurrent cholecystitis in the future, we would proceed with replacement of a cholecystostomy tube. Hopefully, she will not require this intervention in the future. Electronically Signed   By: Malachy Moan M.D.   On: 06/20/2023 15:28    Assessment and Plan:   Kylie May is a 89 y.o. y/o female Presents for f/u of:  1.  Recurrent C. difficile colitis; 3 episodes since October 2024; all 3 episodes treated with vancomycin x 10 days.  Dificid was previously denied by her insurance.  C. difficile started after she took antibiotics initially for dental infection (03/2023) and then again for diverticulitis (05/2023).  She is currently on Vancomycin 125mg  QID and is not having diarrhea at present.  -Finish Current Rx of Vancomycin taper.  -If she has recurrent Diarrhea after finishing Vancomycin taper, then I will treat with Dificid BID x 14 days, #28, No RF.  Prior Auth has currently been approved for Dificid.  -If Dificid does not work, then [employer] fecal tranplant is next step, and I discussed VOWST with patient, her daughter and grandson.  2.  Sigmoid diverticulitis versus colitis treated with Augmentin 05/2023.  Resolved.  Asymptomatic. No abdominal pain or tenderness.  3.  History of acute calculus cholecystitis, recurrent choledocholithiasis, complicated by ascending cholangitis, septic shock, emergent cholecystostomy drain  placement in 08/2018 at [facility], followed by ERCP with biliary sphincterotomy and stone extraction, PD stent placement, s/p removal in 11/2018, cholecystostomy drain for 5 years, drain has been out for 1 month.  No current abdominal pain.  -Labs: CMP, CBC  -Reassurance  -If she has recurrent RUQ pain or elevated LFTs, then she will need to follow-up with interventional radiology and general surgeon to consider  placing another drain.  She is not a candidate for cholecystectomy.  4.  Multiple comorbidities.  Elderly and feeble health.  Celso Amy, PA-C  Follow up with Dr. Allegra Lai in 4 to 6 weeks.

## 2023-07-16 ENCOUNTER — Encounter: Payer: Self-pay | Admitting: Physician Assistant

## 2023-07-16 ENCOUNTER — Other Ambulatory Visit: Payer: Self-pay

## 2023-07-16 ENCOUNTER — Ambulatory Visit (INDEPENDENT_AMBULATORY_CARE_PROVIDER_SITE_OTHER): Payer: Medicare Other | Admitting: Physician Assistant

## 2023-07-16 VITALS — BP 135/67 | HR 79 | Temp 98.0°F | Ht 59.0 in | Wt 105.0 lb

## 2023-07-16 DIAGNOSIS — A0472 Enterocolitis due to Clostridium difficile, not specified as recurrent: Secondary | ICD-10-CM

## 2023-07-16 DIAGNOSIS — Z8701 Personal history of pneumonia (recurrent): Secondary | ICD-10-CM | POA: Diagnosis not present

## 2023-07-16 DIAGNOSIS — K810 Acute cholecystitis: Secondary | ICD-10-CM | POA: Diagnosis not present

## 2023-07-16 DIAGNOSIS — Z8719 Personal history of other diseases of the digestive system: Secondary | ICD-10-CM

## 2023-07-16 DIAGNOSIS — I5032 Chronic diastolic (congestive) heart failure: Secondary | ICD-10-CM | POA: Diagnosis not present

## 2023-07-16 DIAGNOSIS — K859 Acute pancreatitis without necrosis or infection, unspecified: Secondary | ICD-10-CM | POA: Diagnosis not present

## 2023-07-16 DIAGNOSIS — K5790 Diverticulosis of intestine, part unspecified, without perforation or abscess without bleeding: Secondary | ICD-10-CM | POA: Diagnosis not present

## 2023-07-16 DIAGNOSIS — E039 Hypothyroidism, unspecified: Secondary | ICD-10-CM | POA: Diagnosis not present

## 2023-07-16 DIAGNOSIS — Z87891 Personal history of nicotine dependence: Secondary | ICD-10-CM | POA: Diagnosis not present

## 2023-07-16 DIAGNOSIS — N1831 Chronic kidney disease, stage 3a: Secondary | ICD-10-CM | POA: Diagnosis not present

## 2023-07-16 DIAGNOSIS — F32A Depression, unspecified: Secondary | ICD-10-CM | POA: Diagnosis not present

## 2023-07-16 DIAGNOSIS — K8010 Calculus of gallbladder with chronic cholecystitis without obstruction: Secondary | ICD-10-CM

## 2023-07-16 DIAGNOSIS — K222 Esophageal obstruction: Secondary | ICD-10-CM | POA: Diagnosis not present

## 2023-07-16 DIAGNOSIS — D631 Anemia in chronic kidney disease: Secondary | ICD-10-CM | POA: Diagnosis not present

## 2023-07-16 DIAGNOSIS — E785 Hyperlipidemia, unspecified: Secondary | ICD-10-CM | POA: Diagnosis not present

## 2023-07-16 DIAGNOSIS — I480 Paroxysmal atrial fibrillation: Secondary | ICD-10-CM | POA: Diagnosis not present

## 2023-07-16 MED ORDER — FIDAXOMICIN 200 MG PO TABS
200.0000 mg | ORAL_TABLET | Freq: Two times a day (BID) | ORAL | 0 refills | Status: DC
Start: 2023-07-16 — End: 2023-07-30
  Filled 2023-07-16: qty 28, 14d supply, fill #0

## 2023-07-17 ENCOUNTER — Other Ambulatory Visit: Payer: Self-pay | Admitting: Physician Assistant

## 2023-07-17 ENCOUNTER — Telehealth: Payer: Self-pay

## 2023-07-17 ENCOUNTER — Other Ambulatory Visit: Payer: Self-pay

## 2023-07-17 DIAGNOSIS — K859 Acute pancreatitis without necrosis or infection, unspecified: Secondary | ICD-10-CM | POA: Diagnosis not present

## 2023-07-17 DIAGNOSIS — A0472 Enterocolitis due to Clostridium difficile, not specified as recurrent: Secondary | ICD-10-CM

## 2023-07-17 DIAGNOSIS — D631 Anemia in chronic kidney disease: Secondary | ICD-10-CM | POA: Diagnosis not present

## 2023-07-17 DIAGNOSIS — I480 Paroxysmal atrial fibrillation: Secondary | ICD-10-CM | POA: Diagnosis not present

## 2023-07-17 DIAGNOSIS — N1831 Chronic kidney disease, stage 3a: Secondary | ICD-10-CM | POA: Diagnosis not present

## 2023-07-17 DIAGNOSIS — I5032 Chronic diastolic (congestive) heart failure: Secondary | ICD-10-CM | POA: Diagnosis not present

## 2023-07-17 LAB — CBC WITH DIFF/PLATELET
Basophils Absolute: 0.1 10*3/uL (ref 0.0–0.2)
Basos: 1 %
EOS (ABSOLUTE): 0.3 10*3/uL (ref 0.0–0.4)
Eos: 3 %
Hematocrit: 41.2 % (ref 34.0–46.6)
Hemoglobin: 13.9 g/dL (ref 11.1–15.9)
Immature Grans (Abs): 0.2 10*3/uL — ABNORMAL HIGH (ref 0.0–0.1)
Immature Granulocytes: 2 %
Lymphocytes Absolute: 2.1 10*3/uL (ref 0.7–3.1)
Lymphs: 17 %
MCH: 31.8 pg (ref 26.6–33.0)
MCHC: 33.7 g/dL (ref 31.5–35.7)
MCV: 94 fL (ref 79–97)
Monocytes Absolute: 1 10*3/uL — ABNORMAL HIGH (ref 0.1–0.9)
Monocytes: 8 %
Neutrophils Absolute: 8.2 10*3/uL — ABNORMAL HIGH (ref 1.4–7.0)
Neutrophils: 69 %
Platelets: 448 10*3/uL (ref 150–450)
RBC: 4.37 x10E6/uL (ref 3.77–5.28)
RDW: 11.7 % (ref 11.7–15.4)
WBC: 11.9 10*3/uL — ABNORMAL HIGH (ref 3.4–10.8)

## 2023-07-17 LAB — COMPREHENSIVE METABOLIC PANEL
ALT: 17 [IU]/L (ref 0–32)
AST: 22 [IU]/L (ref 0–40)
Albumin: 3.5 g/dL — ABNORMAL LOW (ref 3.6–4.6)
Alkaline Phosphatase: 68 [IU]/L (ref 44–121)
BUN/Creatinine Ratio: 17 (ref 12–28)
BUN: 16 mg/dL (ref 10–36)
Bilirubin Total: 0.2 mg/dL (ref 0.0–1.2)
CO2: 23 mmol/L (ref 20–29)
Calcium: 9.5 mg/dL (ref 8.7–10.3)
Chloride: 98 mmol/L (ref 96–106)
Creatinine, Ser: 0.92 mg/dL (ref 0.57–1.00)
Globulin, Total: 2.5 g/dL (ref 1.5–4.5)
Glucose: 105 mg/dL — ABNORMAL HIGH (ref 70–99)
Potassium: 5.7 mmol/L — ABNORMAL HIGH (ref 3.5–5.2)
Sodium: 135 mmol/L (ref 134–144)
Total Protein: 6 g/dL (ref 6.0–8.5)
eGFR: 56 mL/min/{1.73_m2} — ABNORMAL LOW (ref >59)

## 2023-07-17 LAB — LIPASE: Lipase: 20 U/L (ref 14–85)

## 2023-07-17 MED ORDER — FIDAXOMICIN 200 MG PO TABS
200.0000 mg | ORAL_TABLET | Freq: Two times a day (BID) | ORAL | 0 refills | Status: DC
  Filled 2023-07-17: qty 28, 14d supply, fill #0

## 2023-07-17 NOTE — Telephone Encounter (Signed)
[phone] Lupita Leash  pt  daughter want a call back, Per pt daughter there seems to be a mix up in where the pt medication should have been sent. They would like the medication sent to [facility] pharmacy but somehow it has been a mix up and it seems to at [facility] she thinks.

## 2023-07-17 NOTE — Telephone Encounter (Signed)
Please send the medication to [facility] pharmacy- patient has not checked with the pharmacy and was advised to and then call back if its not there. She stated Inetta Fermo suggested it be sent to [facility].   [phone] Lupita Leash pt daughter want a call back, Per pt daughter there seems to be a mix up in where the pt medication should have been sent. They would like the medication sent to [facility] pharmacy but somehow it has been a mix up and it seems to at [facility] she thinks.

## 2023-07-17 NOTE — Progress Notes (Signed)
[phone] Lupita Leash  pt  daughter want a call back, Per pt daughter there seems to be a mix up in where the pt medication should have been sent. They would like the medication sent to [facility] pharmacy but somehow it has been a mix up and it seems to at [facility] she thinks.     I sent new Rx for Fidaxomicin (Dificid) to [facility]. I appologise for the mix-up. Celso Amy, PA-C

## 2023-07-18 ENCOUNTER — Other Ambulatory Visit: Payer: Self-pay

## 2023-07-18 ENCOUNTER — Telehealth: Payer: Self-pay

## 2023-07-18 NOTE — Telephone Encounter (Signed)
Patient notified.  Call and notify pts daughter: I sent new Rx for Fidaxomicin (Dificid) to [facility]. I appologise for the mix-up. Celso Amy, PA-C

## 2023-07-18 NOTE — Progress Notes (Signed)
Notify patient labs show: 1.  Potassium is a little elevated.  Please stop any potassium supplements.  Avoid potassium foods such as bananas, orange juice, tomatoes, baked potatoes and Gatorade.  Drink lots of water.**Schedule lab visit to repeat potassium lab in 1 week. 2.  Kidney and liver function are good.  Liver tests are normal. 3.  White count is slightly elevated, yet improved compared to labs done in December.   4.  Normal hemoglobin.  No anemia. 5.  Lipase pancreas test is normal. 6.  Based on lab results, no evidence of bile duct obstruction at this time.  Let us know if she starts having upper abdominal pain.  Continue with current plan. Celso Amy, PA-C

## 2023-07-21 ENCOUNTER — Telehealth: Payer: Self-pay

## 2023-07-21 NOTE — Telephone Encounter (Signed)
Spoke with daughter. She stated she will wait until her appointment with Dr.Vanga 08-11-23. Notify patient labs show:  1.  Potassium is a little elevated.  Please stop any potassium supplements.  Avoid potassium foods such as bananas, orange juice, tomatoes, baked potatoes and Gatorade.  Drink lots of water.**Schedule lab visit to repeat potassium lab in 1 week.  2.  Kidney and liver function are good.  Liver tests are normal.  3.  White count is slightly elevated, yet improved compared to labs done in December.   4.  Normal hemoglobin.  No anemia.  5.  Lipase pancreas test is normal.  6.  Based on lab results, no evidence of bile duct obstruction at this time.  Let us know if she starts having upper abdominal pain.  Continue with current plan.  Celso Amy, PA-C

## 2023-07-23 DIAGNOSIS — D631 Anemia in chronic kidney disease: Secondary | ICD-10-CM | POA: Diagnosis not present

## 2023-07-23 DIAGNOSIS — I5032 Chronic diastolic (congestive) heart failure: Secondary | ICD-10-CM | POA: Diagnosis not present

## 2023-07-23 DIAGNOSIS — A0472 Enterocolitis due to Clostridium difficile, not specified as recurrent: Secondary | ICD-10-CM | POA: Diagnosis not present

## 2023-07-23 DIAGNOSIS — K859 Acute pancreatitis without necrosis or infection, unspecified: Secondary | ICD-10-CM | POA: Diagnosis not present

## 2023-07-23 DIAGNOSIS — N1831 Chronic kidney disease, stage 3a: Secondary | ICD-10-CM | POA: Diagnosis not present

## 2023-07-23 DIAGNOSIS — I480 Paroxysmal atrial fibrillation: Secondary | ICD-10-CM | POA: Diagnosis not present

## 2023-07-25 ENCOUNTER — Telehealth: Payer: Self-pay

## 2023-07-25 NOTE — Telephone Encounter (Signed)
 Plan of care forms received needing provider signature forms placed in provider to be signed folder.

## 2023-07-29 ENCOUNTER — Telehealth: Payer: Self-pay

## 2023-07-29 MED ORDER — FIDAXOMICIN 200 MG PO TABS: 200.0000 mg | ORAL_TABLET | Freq: Two times a day (BID) | ORAL | 0 refills | Status: DC

## 2023-07-29 NOTE — Addendum Note (Signed)
 Addended by: Roena Malady on: 07/29/2023 05:36 PM   Modules accepted: Orders

## 2023-07-29 NOTE — Telephone Encounter (Signed)
 Requesting that Dificid Rx be sent to [facility]  I called daughter Lupita Leash ok per DPR back to let her know the Rx has been sent per their request

## 2023-07-30 DIAGNOSIS — K859 Acute pancreatitis without necrosis or infection, unspecified: Secondary | ICD-10-CM | POA: Diagnosis not present

## 2023-07-30 DIAGNOSIS — I5032 Chronic diastolic (congestive) heart failure: Secondary | ICD-10-CM | POA: Diagnosis not present

## 2023-07-30 DIAGNOSIS — A0472 Enterocolitis due to Clostridium difficile, not specified as recurrent: Secondary | ICD-10-CM | POA: Diagnosis not present

## 2023-07-30 DIAGNOSIS — D631 Anemia in chronic kidney disease: Secondary | ICD-10-CM | POA: Diagnosis not present

## 2023-07-30 DIAGNOSIS — I480 Paroxysmal atrial fibrillation: Secondary | ICD-10-CM | POA: Diagnosis not present

## 2023-07-30 DIAGNOSIS — N1831 Chronic kidney disease, stage 3a: Secondary | ICD-10-CM | POA: Diagnosis not present

## 2023-08-01 NOTE — Telephone Encounter (Signed)
 Faxed to [phone] with a completed transmission log

## 2023-08-06 ENCOUNTER — Telehealth: Payer: Self-pay

## 2023-08-06 NOTE — Telephone Encounter (Signed)
 Patient daughter called office to say her mother is having symptoms of c-diff and was told to call Inetta Fermo prior to starting the medication- spoke with Inetta Fermo- ok to proceed with taking medication. Instructed to keep follow up appointment with Dr.Vanga.

## 2023-08-07 DIAGNOSIS — N1831 Chronic kidney disease, stage 3a: Secondary | ICD-10-CM | POA: Diagnosis not present

## 2023-08-07 DIAGNOSIS — I5032 Chronic diastolic (congestive) heart failure: Secondary | ICD-10-CM | POA: Diagnosis not present

## 2023-08-07 DIAGNOSIS — I480 Paroxysmal atrial fibrillation: Secondary | ICD-10-CM | POA: Diagnosis not present

## 2023-08-07 DIAGNOSIS — D631 Anemia in chronic kidney disease: Secondary | ICD-10-CM | POA: Diagnosis not present

## 2023-08-07 DIAGNOSIS — K859 Acute pancreatitis without necrosis or infection, unspecified: Secondary | ICD-10-CM | POA: Diagnosis not present

## 2023-08-07 DIAGNOSIS — A0472 Enterocolitis due to Clostridium difficile, not specified as recurrent: Secondary | ICD-10-CM | POA: Diagnosis not present

## 2023-08-11 ENCOUNTER — Ambulatory Visit (INDEPENDENT_AMBULATORY_CARE_PROVIDER_SITE_OTHER): Admitting: Gastroenterology

## 2023-08-11 ENCOUNTER — Encounter: Payer: Self-pay | Admitting: Gastroenterology

## 2023-08-11 ENCOUNTER — Ambulatory Visit (INDEPENDENT_AMBULATORY_CARE_PROVIDER_SITE_OTHER): Payer: Medicare Other | Admitting: Gastroenterology

## 2023-08-11 VITALS — BP 162/87 | HR 73 | Temp 97.8°F | Ht 59.0 in | Wt 103.1 lb

## 2023-08-11 DIAGNOSIS — A498 Other bacterial infections of unspecified site: Secondary | ICD-10-CM

## 2023-08-11 DIAGNOSIS — Z8719 Personal history of other diseases of the digestive system: Secondary | ICD-10-CM

## 2023-08-11 DIAGNOSIS — A0472 Enterocolitis due to Clostridium difficile, not specified as recurrent: Secondary | ICD-10-CM

## 2023-08-11 DIAGNOSIS — A0471 Enterocolitis due to Clostridium difficile, recurrent: Secondary | ICD-10-CM | POA: Diagnosis not present

## 2023-08-11 MED ORDER — FIDAXOMICIN 200 MG PO TABS: 200.0000 mg | ORAL_TABLET | Freq: Two times a day (BID) | ORAL | 2 refills | Status: DC

## 2023-08-11 NOTE — Patient Instructions (Signed)
 Applying for Vowst and keep taking the Dificid till you receive the medication.

## 2023-08-12 ENCOUNTER — Encounter: Payer: Self-pay | Admitting: Gastroenterology

## 2023-08-12 ENCOUNTER — Telehealth: Payer: Self-pay

## 2023-08-12 NOTE — Telephone Encounter (Signed)
 Faxed paper work and clinicals to the Patient support company with [employer]. Faxed demographics, insurance cards, medication list, stool test and last office visit note.

## 2023-08-12 NOTE — Progress Notes (Signed)
 Arlyss Repress, MD [address]  [address]  [place], [place] [address]  Main: [phone]  Fax: [phone]    Gastroenterology Consultation  Referring Provider:     Bethanie Dicker, NP Primary Care Physician:  Bethanie Dicker, NP Primary Gastroenterologist:  Dr. Arlyss Repress Reason for Consultation: Recurrent C. difficile infection        HPI:   Kylie May is a 89 y.o. female referred by Bethanie Dicker, NP  for consultation & management of recurrent C. difficile infection. First episode of C. difficile 03/2023 treated with oral vancomycin Diverticulitis versus distal rectosigmoid colitis in 05/2023 treated with Augmentin and C. difficile was also positive, Dificid was prescribed but denied by insurance, switched to oral vancomycin 07/10/2023: C. difficile positive treated with oral vancomycin. Diarrhea returned last week, started on Dificid, approved by insurance.  Diarrhea resolved after 2 days of taking Dificid Patient is accompanied by her grandson and daughter, significant hearing impairment  NSAIDs: None  Antiplts/Anticoagulants/Anti thrombotics: Eliquis for history of A Fib  GI Procedures:  Upper endoscopy 11/16/2018 - Normal ampulla and second portion of the duodenum. - Non- bleeding duodenal diverticulum. - 2 cm hiatal hernia. - Normal stomach. - Normal gastroesophageal junction and esophagus. - No specimens collected.  Past Medical History:  Diagnosis Date   A-fib (HCC)    Acid reflux disease    Arrhythmia    Atrial fibrillation with RVR (HCC) 08/15/2018   Chronic diastolic CHF (congestive heart failure) (HCC)    Compression fracture of lumbar vertebra (HCC)    Diverticulitis 01-2008   [facility] [place]   Diverticulosis    Esophageal stricture 2006   Femoral bruit    Right   Fibrocystic breast disease    Hiatal hernia    History of esophageal stricture 06/14/2004   2006 esophageal dilation Schatzki's ring    History of skin cancer 12/29/2008   Basal cell  cancer right glabella 07/10/2010 Dr. Adolphus Birchwood, [facility] @ [place] , [place]    Hypercholesterolemia    [place] study LDL goal = < 160   Mitral regurgitation    a. 06/2014 EF 55-60%, elevated end-diastolic pressures, dilated LA at 4.3 cm, mildly dilated RA, severe mitral regurgitation, mild aortic sclerosis without stenosis, mod-severe TR   Mitral valve prolapse    Osteoporosis    Dr Marcelle Overlie   Pancreatitis 07-2008   Hospitalized    Patella fracture 05/13/2018   Schatzki's ring    Sepsis (HCC) 08/15/2018   Skin cancer    facial x 2. Dr Adolphus Birchwood    Past Surgical History:  Procedure Laterality Date   BILIARY STENT PLACEMENT  08/21/2018   Procedure: BILIARY STENT PLACEMENT;  Surgeon: Kerin Salen, MD;  Location: [facility] ENDOSCOPY;  Service: Gastroenterology;;   COLONOSCOPY  2006   Epidural steroids     x1   ERCP N/A 08/21/2018   Procedure: ENDOSCOPIC RETROGRADE CHOLANGIOPANCREATOGRAPHY (ERCP);  Surgeon: Kerin Salen, MD;  Location: [facility] ENDOSCOPY;  Service: Gastroenterology;  Laterality: N/A;   ESOPHAGEAL DILATION  2006   ESOPHAGOGASTRODUODENOSCOPY (EGD) WITH PROPOFOL N/A 11/16/2018   Procedure: ESOPHAGOGASTRODUODENOSCOPY (EGD) WITH PROPOFOL;  Surgeon: Toney Reil, MD;  Location: [facility] ENDOSCOPY;  Service: Gastroenterology;  Laterality: N/A;   G3 P4 (twins)     IR CHOLANGIOGRAM EXISTING TUBE  08/18/2018   IR CHOLANGIOGRAM EXISTING TUBE  07/16/2022   IR CHOLANGIOGRAM EXISTING TUBE  04/28/2023   IR EXCHANGE BILIARY DRAIN  11/03/2018   IR EXCHANGE BILIARY DRAIN  12/01/2018   IR EXCHANGE  BILIARY DRAIN  05/13/2019   IR EXCHANGE BILIARY DRAIN  06/14/2019   IR EXCHANGE BILIARY DRAIN  07/19/2019   IR EXCHANGE BILIARY DRAIN  08/17/2019   IR EXCHANGE BILIARY DRAIN  09/30/2019   IR EXCHANGE BILIARY DRAIN  12/08/2019   IR EXCHANGE BILIARY DRAIN  01/19/2020   IR EXCHANGE BILIARY DRAIN  03/01/2020   IR EXCHANGE BILIARY DRAIN  04/20/2020   IR EXCHANGE BILIARY DRAIN  07/06/2020   IR EXCHANGE BILIARY DRAIN  10/26/2020   IR  EXCHANGE BILIARY DRAIN  01/18/2021   IR EXCHANGE BILIARY DRAIN  03/05/2021   IR EXCHANGE BILIARY DRAIN  05/25/2021   IR EXCHANGE BILIARY DRAIN  08/03/2021   IR EXCHANGE BILIARY DRAIN  09/28/2021   IR EXCHANGE BILIARY DRAIN  11/23/2021   IR EXCHANGE BILIARY DRAIN  12/27/2021   IR EXCHANGE BILIARY DRAIN  02/20/2022   IR EXCHANGE BILIARY DRAIN  04/17/2022   IR EXCHANGE BILIARY DRAIN  06/17/2022   IR EXCHANGE BILIARY DRAIN  07/12/2022   IR EXCHANGE BILIARY DRAIN  08/12/2022   IR EXCHANGE BILIARY DRAIN  09/20/2022   IR EXCHANGE BILIARY DRAIN  11/01/2022   IR EXCHANGE BILIARY DRAIN  12/31/2022   IR EXCHANGE BILIARY DRAIN  02/25/2023   IR EXCHANGE BILIARY DRAIN  03/12/2023   IR EXCHANGE BILIARY DRAIN  03/25/2023   IR EXCHANGE BILIARY DRAIN  05/12/2023   IR EXCHANGE BILIARY DRAIN  06/20/2023   IR PERC CHOLECYSTOSTOMY  08/16/2018   IR RADIOLOGIST EVAL & MGMT  11/08/2021   IR RADIOLOGIST EVAL & MGMT  01/10/2023   REMOVAL OF STONES  08/21/2018   Procedure: REMOVAL OF STONES;  Surgeon: Kerin Salen, MD;  Location: [facility] ENDOSCOPY;  Service: Gastroenterology;;   SKIN CANCER EXCISION     SPHINCTEROTOMY  08/21/2018   Procedure: SPHINCTEROTOMY;  Surgeon: Kerin Salen, MD;  Location: [facility] ENDOSCOPY;  Service: Gastroenterology;;     Current Outpatient Medications:    acetaminophen (TYLENOL) 325 MG tablet, Take 2 tablets (650 mg total) by mouth every 6 (six) hours as needed for mild pain (or Fever >/= 101)., Disp: , Rfl:    amiodarone (PACERONE) 200 MG tablet, Take 1 tablet (200 mg total) by mouth daily., Disp: 90 tablet, Rfl: 3   apixaban (ELIQUIS) 2.5 MG TABS tablet, Take 1 tablet (2.5 mg total) by mouth 2 (two) times daily., Disp: 180 tablet, Rfl: 3   azelastine (ASTELIN) 0.1 % nasal spray, Place 2 sprays into both nostrils 2 (two) times daily. Use in each nostril as directed, Disp: 30 mL, Rfl: 12   CRANBERRY PO, Take 2 tablets by mouth daily., Disp: , Rfl:    famotidine (PEPCID) 20 MG tablet, Take 20 mg by mouth at bedtime.,  Disp: , Rfl:    ferrous sulfate 325 (65 FE) MG EC tablet, Take 325 mg by mouth daily., Disp: , Rfl:    fluticasone (FLONASE) 50 MCG/ACT nasal spray, Place 2 sprays into both nostrils daily., Disp: 16 g, Rfl: 1   hydrocortisone (ANUSOL-HC) 25 MG suppository, Place 1 suppository (25 mg total) rectally 2 (two) times daily as needed for hemorrhoids or anal itching., Disp: 14 suppository, Rfl: 1   levothyroxine (SYNTHROID) 50 MCG tablet, Take 1 tablet (50 mcg total) by mouth daily before breakfast., Disp: 90 tablet, Rfl: 3   magnesium chloride (SLOW-MAG) 64 MG TBEC SR tablet, Take 2 tablets (128 mg total) by mouth daily., Disp: 60 tablet, Rfl: 3   metoprolol tartrate (LOPRESSOR) 25 MG tablet, Take 25 mg by  mouth 2 (two) times daily., Disp: , Rfl:    montelukast (SINGULAIR) 10 MG tablet, TAKE 1 TABLET(10 MG) BY MOUTH AT BEDTIME, Disp: 30 tablet, Rfl: 3   nystatin cream (MYCOSTATIN), , Disp: , Rfl:    omeprazole (PRILOSEC) 40 MG capsule, TAKE ONE CAPSULE BY MOUTH EVERY MORNING AND 1 AT BEDTIME AS DIRECTED, Disp: 180 capsule, Rfl: 1   ondansetron (ZOFRAN) 4 MG tablet, Take 1 tablet (4 mg total) by mouth daily as needed for nausea or vomiting., Disp: 30 tablet, Rfl: 1   Probiotic Product (PROBIOTIC PO), Take 1 capsule by mouth 2 (two) times daily., Disp: , Rfl:    fidaxomicin (DIFICID) 200 MG TABS tablet, Take 1 tablet (200 mg total) by mouth 2 (two) times daily., Disp: 28 tablet, Rfl: 2   Family History  Problem Relation Age of Onset   Breast cancer Maternal Aunt    Hepatitis Daughter        C   Lung disease Mother        lung tumor   Heart attack Daughter 64       S/P stents   Liver cancer Daughter    Heart attack Sister 2   Diabetes Neg Hx    Stroke Neg Hx      Social History   Tobacco Use   Smoking status: Former    Current packs/day: 0.00    Types: Cigarettes    Quit date: 06/04/1951    Years since quitting: 72.2    Passive exposure: Past   Smokeless tobacco: Never   Tobacco  comments:    Smoked [phone] , up to 2 cigarettes/day  Vaping Use   Vaping status: Never Used  Substance Use Topics   Alcohol use: Not Currently    Alcohol/week: 1.0 standard drink of alcohol    Types: 1 Glasses of wine per week    Comment: occassional   Drug use: No    Allergies as of 08/11/2023 - Review Complete 08/11/2023  Allergen Reaction Noted   Metronidazole  11/11/2006   Antihistamines, loratadine-type Rash 05/30/2023   Penicillin g Rash 08/11/2015    Review of Systems:    All systems reviewed and negative except where noted in HPI.   Physical Exam:  BP (!) 162/87 (BP Location: Left Arm, Patient Position: Sitting, Cuff Size: Normal)   Pulse 73   Temp 97.8 F (36.6 C) (Oral)   Ht 4\' 11"  (1.499 m)   Wt 103 lb 2 oz (46.8 kg)   BMI 20.83 kg/m  No LMP recorded. Patient is postmenopausal.  General:   Alert,  Well-developed, well-nourished, pleasant and cooperative in NAD Head:  Normocephalic and atraumatic. Eyes:  Sclera clear, no icterus.   Conjunctiva pink. Ears: Decreased auditory acuity. Nose:  No deformity, discharge, or lesions. Mouth:  No deformity or lesions,oropharynx pink & moist. Neck:  Supple; no masses or thyromegaly. Lungs:  Respirations even and unlabored.  Clear throughout to auscultation.   No wheezes, crackles, or rhonchi. No acute distress. Heart:  Regular rate and rhythm; no murmurs, clicks, rubs, or gallops. Abdomen:  Normal bowel sounds. Soft, non-tender and non-distended without masses, hepatosplenomegaly or hernias noted.  No guarding or rebound tenderness.   Rectal: Not performed Msk:  Symmetrical without gross deformities. Good, equal movement & strength bilaterally. Pulses:  Normal pulses noted. Extremities:  No clubbing or edema.  No cyanosis. Neurologic:  Alert and oriented x3;  grossly normal neurologically. Skin:  Intact without significant lesions or rashes. No jaundice. Psych:  Alert and cooperative. Normal mood and  affect.  Imaging Studies: Reviewed  Assessment and Plan:   FATEMAH POURCIAU is a 89 y.o. female with history of A-fib on Eliquis, history of acute calculus cholecystitis s/p percutaneous cholecystostomy drain placement and  drain fell off in 06/2023 is seen in consultation for recurrent C. difficile colitis  Recurrent C. difficile colitis Patient had third recurrence and currently diarrhea resolved with Pradaxa mycin I have discussed about intestinal microbiota transplantation  either by mouth or enema Prefers oral approach Will initiate paperwork for approval Recheck potassium Continue fidaxomicin   Follow up as needed   Arlyss Repress, MD

## 2023-08-13 DIAGNOSIS — K859 Acute pancreatitis without necrosis or infection, unspecified: Secondary | ICD-10-CM | POA: Diagnosis not present

## 2023-08-13 DIAGNOSIS — N1831 Chronic kidney disease, stage 3a: Secondary | ICD-10-CM | POA: Diagnosis not present

## 2023-08-13 DIAGNOSIS — D631 Anemia in chronic kidney disease: Secondary | ICD-10-CM | POA: Diagnosis not present

## 2023-08-13 DIAGNOSIS — A0472 Enterocolitis due to Clostridium difficile, not specified as recurrent: Secondary | ICD-10-CM | POA: Diagnosis not present

## 2023-08-13 DIAGNOSIS — I480 Paroxysmal atrial fibrillation: Secondary | ICD-10-CM | POA: Diagnosis not present

## 2023-08-13 DIAGNOSIS — I5032 Chronic diastolic (congestive) heart failure: Secondary | ICD-10-CM | POA: Diagnosis not present

## 2023-08-13 NOTE — Telephone Encounter (Signed)
 [facility]st patient support was calling to find out when the patient started the Dificid. Informed her it was started on 07/16/2023 refilled on 07/29/2023. We then refilled it again at her appointment on 08/11/2023 and added refills to it. She states that all they needed to know and they are going to transfer everything to [employer]. The phone number is [phone]

## 2023-08-13 NOTE — Telephone Encounter (Signed)
 Per [facility] pharmacy at [phone] Cover my meds ref  id [id number]

## 2023-08-13 NOTE — Telephone Encounter (Signed)
 Outcome Approved today by [facility] [employer] 2017 [id number];Review Type:Prior Auth;Coverage Start Date:07/14/2023;Coverage End Date:09/12/2023; Effective Date: 07/14/2023 Authorization Expiration Date: 09/12/2023

## 2023-08-13 NOTE — Telephone Encounter (Signed)
 Submitted PA through cover my meds waiting on response from insurance company

## 2023-08-14 NOTE — Progress Notes (Signed)
 error

## 2023-08-15 DIAGNOSIS — I480 Paroxysmal atrial fibrillation: Secondary | ICD-10-CM | POA: Diagnosis not present

## 2023-08-15 DIAGNOSIS — E785 Hyperlipidemia, unspecified: Secondary | ICD-10-CM | POA: Diagnosis not present

## 2023-08-15 DIAGNOSIS — D631 Anemia in chronic kidney disease: Secondary | ICD-10-CM | POA: Diagnosis not present

## 2023-08-15 DIAGNOSIS — K5790 Diverticulosis of intestine, part unspecified, without perforation or abscess without bleeding: Secondary | ICD-10-CM | POA: Diagnosis not present

## 2023-08-15 DIAGNOSIS — F32A Depression, unspecified: Secondary | ICD-10-CM | POA: Diagnosis not present

## 2023-08-15 DIAGNOSIS — Z8701 Personal history of pneumonia (recurrent): Secondary | ICD-10-CM | POA: Diagnosis not present

## 2023-08-15 DIAGNOSIS — A0472 Enterocolitis due to Clostridium difficile, not specified as recurrent: Secondary | ICD-10-CM | POA: Diagnosis not present

## 2023-08-15 DIAGNOSIS — K859 Acute pancreatitis without necrosis or infection, unspecified: Secondary | ICD-10-CM | POA: Diagnosis not present

## 2023-08-15 DIAGNOSIS — E039 Hypothyroidism, unspecified: Secondary | ICD-10-CM | POA: Diagnosis not present

## 2023-08-15 DIAGNOSIS — I5032 Chronic diastolic (congestive) heart failure: Secondary | ICD-10-CM | POA: Diagnosis not present

## 2023-08-15 DIAGNOSIS — Z87891 Personal history of nicotine dependence: Secondary | ICD-10-CM | POA: Diagnosis not present

## 2023-08-15 DIAGNOSIS — N1831 Chronic kidney disease, stage 3a: Secondary | ICD-10-CM | POA: Diagnosis not present

## 2023-08-15 DIAGNOSIS — K810 Acute cholecystitis: Secondary | ICD-10-CM | POA: Diagnosis not present

## 2023-08-15 DIAGNOSIS — K222 Esophageal obstruction: Secondary | ICD-10-CM | POA: Diagnosis not present

## 2023-08-18 ENCOUNTER — Telehealth: Payer: Self-pay | Admitting: Gastroenterology

## 2023-08-18 DIAGNOSIS — A0472 Enterocolitis due to Clostridium difficile, not specified as recurrent: Secondary | ICD-10-CM

## 2023-08-18 MED ORDER — FIDAXOMICIN 200 MG PO TABS: 200.0000 mg | ORAL_TABLET | Freq: Two times a day (BID) | ORAL | 2 refills | Status: DC

## 2023-08-18 NOTE — Addendum Note (Signed)
 Addended by: Radene Knee L on: 08/18/2023 04:41 PM   Modules accepted: Orders

## 2023-08-18 NOTE — Telephone Encounter (Signed)
 [facility] she states she has been getting text messages from [facility] pharmacy she asked if she needed to call them. Informed her yes she did because they will giving them instructions on how to take the Vowst and mail the medication to them

## 2023-08-18 NOTE — Telephone Encounter (Signed)
 Pt daughter Clydie Braun is requesting call back pt medication Dificid was sent to [facility] on [facility]/11/[facility] of [facility] she states that [facility] would need prior [facility] and cost at [facility] would be 10,000.00

## 2023-08-20 ENCOUNTER — Telehealth: Payer: Self-pay

## 2023-08-20 DIAGNOSIS — I5032 Chronic diastolic (congestive) heart failure: Secondary | ICD-10-CM | POA: Diagnosis not present

## 2023-08-20 DIAGNOSIS — N1831 Chronic kidney disease, stage 3a: Secondary | ICD-10-CM | POA: Diagnosis not present

## 2023-08-20 DIAGNOSIS — D631 Anemia in chronic kidney disease: Secondary | ICD-10-CM | POA: Diagnosis not present

## 2023-08-20 DIAGNOSIS — I480 Paroxysmal atrial fibrillation: Secondary | ICD-10-CM | POA: Diagnosis not present

## 2023-08-20 DIAGNOSIS — A0472 Enterocolitis due to Clostridium difficile, not specified as recurrent: Secondary | ICD-10-CM | POA: Diagnosis not present

## 2023-08-20 DIAGNOSIS — K859 Acute pancreatitis without necrosis or infection, unspecified: Secondary | ICD-10-CM | POA: Diagnosis not present

## 2023-08-20 NOTE — Telephone Encounter (Signed)
 Patient daughter Clydie Braun left a message on my voicemail stating she had some questions about what she is supposed to do about the Vowst and Dificid. She states that they thought they were supposed to be taking both medication at the same time but the pharmacy is telling them they are supposed to stop the Dificid 3 to 4 days prior to starting the Vowst because that makes the medication works better. Return Clydie Braun called and explained to her to follow the instructions from the speciality pharmacy. She asked if she needed to start taking the Difficid after she finish the Vowst and told her no. She said okay reminder her that Dr. Allegra Lai did order a lab work and she states she will try to take her to have this done as soon as she can.

## 2023-08-26 ENCOUNTER — Telehealth: Payer: Self-pay

## 2023-08-26 DIAGNOSIS — N1831 Chronic kidney disease, stage 3a: Secondary | ICD-10-CM | POA: Diagnosis not present

## 2023-08-26 DIAGNOSIS — D631 Anemia in chronic kidney disease: Secondary | ICD-10-CM | POA: Diagnosis not present

## 2023-08-26 DIAGNOSIS — I480 Paroxysmal atrial fibrillation: Secondary | ICD-10-CM | POA: Diagnosis not present

## 2023-08-26 DIAGNOSIS — I5032 Chronic diastolic (congestive) heart failure: Secondary | ICD-10-CM | POA: Diagnosis not present

## 2023-08-26 DIAGNOSIS — K859 Acute pancreatitis without necrosis or infection, unspecified: Secondary | ICD-10-CM | POA: Diagnosis not present

## 2023-08-26 DIAGNOSIS — A0472 Enterocolitis due to Clostridium difficile, not specified as recurrent: Secondary | ICD-10-CM | POA: Diagnosis not present

## 2023-08-26 MED ORDER — FAMOTIDINE 40 MG PO TABS: 40.0000 mg | ORAL_TABLET | Freq: Every day | ORAL | 3 refills | Status: DC

## 2023-08-26 NOTE — Telephone Encounter (Signed)
-----   Message from [facility] sent at 08/26/2023 10:30 AM EDT ----- Regarding: Regarding omeprazole Morrie Sheldon  Can you please check with patient's family about omeprazole 40 mg twice daily that she is taking and if she really needs it, otherwise, she can switch to Pepcid 40 mg daily  RV

## 2023-08-26 NOTE — Telephone Encounter (Signed)
 Patient daughter states she has been on it for a long time she said. She said her mom does not express her symptoms though. Informed her to start taking Pecpid 40mg  daily instead of Omeprazole and she verbalized understanding.

## 2023-08-28 ENCOUNTER — Telehealth: Payer: Self-pay

## 2023-08-28 NOTE — Telephone Encounter (Signed)
 [facility] home health orders received needing provider signature.    Forms had a note on them about needing to be signed and faxed back within 7 days for billing compliance forms have been handed to you due to deadline.

## 2023-08-28 NOTE — Telephone Encounter (Signed)
 Forms faxed in the given fax number [phone] with a completed transmission log and placed in front staff folder to be scanned into chart

## 2023-09-03 DIAGNOSIS — I480 Paroxysmal atrial fibrillation: Secondary | ICD-10-CM | POA: Diagnosis not present

## 2023-09-03 DIAGNOSIS — N1831 Chronic kidney disease, stage 3a: Secondary | ICD-10-CM | POA: Diagnosis not present

## 2023-09-03 DIAGNOSIS — A0472 Enterocolitis due to Clostridium difficile, not specified as recurrent: Secondary | ICD-10-CM | POA: Diagnosis not present

## 2023-09-03 DIAGNOSIS — I5032 Chronic diastolic (congestive) heart failure: Secondary | ICD-10-CM | POA: Diagnosis not present

## 2023-09-03 DIAGNOSIS — K859 Acute pancreatitis without necrosis or infection, unspecified: Secondary | ICD-10-CM | POA: Diagnosis not present

## 2023-09-03 DIAGNOSIS — D631 Anemia in chronic kidney disease: Secondary | ICD-10-CM | POA: Diagnosis not present

## 2023-09-08 ENCOUNTER — Telehealth: Payer: Self-pay

## 2023-09-08 DIAGNOSIS — A498 Other bacterial infections of unspecified site: Secondary | ICD-10-CM

## 2023-09-08 DIAGNOSIS — A0472 Enterocolitis due to Clostridium difficile, not specified as recurrent: Secondary | ICD-10-CM

## 2023-09-08 NOTE — Telephone Encounter (Signed)
 Patient daughter verbalized understanding she will go pick up the stool kits and start her on the Dificid.

## 2023-09-08 NOTE — Telephone Encounter (Signed)
 Patient daughter Clydie Braun left a message on my voicemail this morning stating that patient was having side effects to the Vowst she thinks and she is needing some advice. Return Clydie Braun call states since 08/29/2023 she has been having sever diarrhea again. Daughter states they stopped the Omeprazole and then like two days later she began to have diarrhea. She began reading that the diarrhea could be coming from stopping the omeprazole so she began giving her the medication again. She states that she is having diarrhea severely that is a lot of content each time 4 times a day during the day. She will go during the night on her and will be all over her in her pull up and all over her bed when she gets her up each morning. She states sometimes she will have a clear mucous in her diarrhea. She does have a bad smell her stool but she does not know if it is the same C diff smell or not. The stool color is dark brown. The patient does not eat a lot any way but they are trying to get her to eat as much as she will. They are trying to get her to drink as much fluids.

## 2023-09-09 DIAGNOSIS — A498 Other bacterial infections of unspecified site: Secondary | ICD-10-CM | POA: Diagnosis not present

## 2023-09-09 DIAGNOSIS — A0472 Enterocolitis due to Clostridium difficile, not specified as recurrent: Secondary | ICD-10-CM | POA: Diagnosis not present

## 2023-09-11 ENCOUNTER — Telehealth: Payer: Self-pay

## 2023-09-11 LAB — GI PROFILE, STOOL, PCR

## 2023-09-11 LAB — C DIFFICILE TOXINS A+B W/RFLX: C difficile Toxins A+B, EIA: POSITIVE — AB

## 2023-09-11 NOTE — Telephone Encounter (Signed)
-----   Message from [facility] sent at 09/11/2023  8:04 AM EDT ----- Stool studies for C. difficile toxin came back positive.  While continuing Dificid, we can try Vowst for second round if patient and family are agreeable  RV

## 2023-09-11 NOTE — Telephone Encounter (Signed)
 Patient daughter Kylie May states she read online that the Vowst could cause diarrhea in patients up to 8 months could that be case could be causing her diarrhea

## 2023-09-11 NOTE — Telephone Encounter (Signed)
 Patient is taking a 10 day work of Dificid and the daughter wants to know if she needs if any benefit if they take the Dificid longer before doing the Vowst again.  Daughter states in about 3.5 weeks the patient granddaughter gets married in [place] and they want her to be at the wedding and not have the diarrhea going on. She states she is scared to have her do another round of Vowst with what happen this time and her diarrhea return so soon.

## 2023-09-12 DIAGNOSIS — I5032 Chronic diastolic (congestive) heart failure: Secondary | ICD-10-CM | POA: Diagnosis not present

## 2023-09-12 DIAGNOSIS — N1831 Chronic kidney disease, stage 3a: Secondary | ICD-10-CM | POA: Diagnosis not present

## 2023-09-12 DIAGNOSIS — I480 Paroxysmal atrial fibrillation: Secondary | ICD-10-CM | POA: Diagnosis not present

## 2023-09-12 DIAGNOSIS — A0472 Enterocolitis due to Clostridium difficile, not specified as recurrent: Secondary | ICD-10-CM | POA: Diagnosis not present

## 2023-09-12 DIAGNOSIS — D631 Anemia in chronic kidney disease: Secondary | ICD-10-CM | POA: Diagnosis not present

## 2023-09-12 DIAGNOSIS — K859 Acute pancreatitis without necrosis or infection, unspecified: Secondary | ICD-10-CM | POA: Diagnosis not present

## 2023-09-15 MED ORDER — DIFICID 200 MG PO TABS: 200.0000 mg | ORAL_TABLET | Freq: Two times a day (BID) | ORAL | 2 refills | Status: DC

## 2023-09-15 NOTE — Telephone Encounter (Signed)
Patient daughter verbalized understanding. Sent medication to the pharmacy  ?

## 2023-09-25 DIAGNOSIS — D2272 Melanocytic nevi of left lower limb, including hip: Secondary | ICD-10-CM | POA: Diagnosis not present

## 2023-09-25 DIAGNOSIS — D485 Neoplasm of uncertain behavior of skin: Secondary | ICD-10-CM | POA: Diagnosis not present

## 2023-09-25 DIAGNOSIS — C44519 Basal cell carcinoma of skin of other part of trunk: Secondary | ICD-10-CM | POA: Diagnosis not present

## 2023-09-25 DIAGNOSIS — D2261 Melanocytic nevi of right upper limb, including shoulder: Secondary | ICD-10-CM | POA: Diagnosis not present

## 2023-09-25 DIAGNOSIS — Z85828 Personal history of other malignant neoplasm of skin: Secondary | ICD-10-CM | POA: Diagnosis not present

## 2023-09-25 DIAGNOSIS — D0439 Carcinoma in situ of skin of other parts of face: Secondary | ICD-10-CM | POA: Diagnosis not present

## 2023-09-25 DIAGNOSIS — D2262 Melanocytic nevi of left upper limb, including shoulder: Secondary | ICD-10-CM | POA: Diagnosis not present

## 2023-09-25 DIAGNOSIS — D225 Melanocytic nevi of trunk: Secondary | ICD-10-CM | POA: Diagnosis not present

## 2023-10-09 ENCOUNTER — Inpatient Hospital Stay
Admission: EM | Admit: 2023-10-09 | Discharge: 2023-10-11 | DRG: 193 | Disposition: A | Discharge status: Home / Self Care | Attending: Internal Medicine | Admitting: Internal Medicine

## 2023-10-09 ENCOUNTER — Emergency Department

## 2023-10-09 ENCOUNTER — Other Ambulatory Visit: Payer: Self-pay

## 2023-10-09 ENCOUNTER — Ambulatory Visit: Payer: Self-pay

## 2023-10-09 DIAGNOSIS — Z6823 Body mass index (BMI) 23.0-23.9, adult: Secondary | ICD-10-CM

## 2023-10-09 DIAGNOSIS — R531 Weakness: Secondary | ICD-10-CM

## 2023-10-09 DIAGNOSIS — I4821 Permanent atrial fibrillation: Secondary | ICD-10-CM | POA: Diagnosis not present

## 2023-10-09 DIAGNOSIS — J159 Unspecified bacterial pneumonia: Secondary | ICD-10-CM | POA: Diagnosis not present

## 2023-10-09 DIAGNOSIS — I700 Atherosclerosis of aorta: Secondary | ICD-10-CM | POA: Diagnosis not present

## 2023-10-09 DIAGNOSIS — Z515 Encounter for palliative care: Secondary | ICD-10-CM | POA: Diagnosis not present

## 2023-10-09 DIAGNOSIS — J189 Pneumonia, unspecified organism: Secondary | ICD-10-CM | POA: Diagnosis not present

## 2023-10-09 DIAGNOSIS — R918 Other nonspecific abnormal finding of lung field: Secondary | ICD-10-CM | POA: Diagnosis not present

## 2023-10-09 DIAGNOSIS — K219 Gastro-esophageal reflux disease without esophagitis: Secondary | ICD-10-CM | POA: Diagnosis not present

## 2023-10-09 DIAGNOSIS — J90 Pleural effusion, not elsewhere classified: Secondary | ICD-10-CM | POA: Diagnosis present

## 2023-10-09 DIAGNOSIS — J9601 Acute respiratory failure with hypoxia: Secondary | ICD-10-CM

## 2023-10-09 DIAGNOSIS — Z880 Allergy status to penicillin: Secondary | ICD-10-CM

## 2023-10-09 DIAGNOSIS — Z87891 Personal history of nicotine dependence: Secondary | ICD-10-CM

## 2023-10-09 DIAGNOSIS — Z789 Other specified health status: Secondary | ICD-10-CM | POA: Diagnosis not present

## 2023-10-09 DIAGNOSIS — R296 Repeated falls: Secondary | ICD-10-CM | POA: Diagnosis not present

## 2023-10-09 DIAGNOSIS — E7800 Pure hypercholesterolemia, unspecified: Secondary | ICD-10-CM | POA: Diagnosis not present

## 2023-10-09 DIAGNOSIS — Z7901 Long term (current) use of anticoagulants: Secondary | ICD-10-CM

## 2023-10-09 DIAGNOSIS — R0902 Hypoxemia: Secondary | ICD-10-CM | POA: Diagnosis not present

## 2023-10-09 DIAGNOSIS — D509 Iron deficiency anemia, unspecified: Secondary | ICD-10-CM | POA: Diagnosis present

## 2023-10-09 DIAGNOSIS — Z1152 Encounter for screening for COVID-19: Secondary | ICD-10-CM

## 2023-10-09 DIAGNOSIS — Z7189 Other specified counseling: Secondary | ICD-10-CM | POA: Diagnosis not present

## 2023-10-09 DIAGNOSIS — R0989 Other specified symptoms and signs involving the circulatory and respiratory systems: Secondary | ICD-10-CM | POA: Diagnosis not present

## 2023-10-09 DIAGNOSIS — Z79899 Other long term (current) drug therapy: Secondary | ICD-10-CM | POA: Diagnosis not present

## 2023-10-09 DIAGNOSIS — I5032 Chronic diastolic (congestive) heart failure: Secondary | ICD-10-CM | POA: Diagnosis not present

## 2023-10-09 DIAGNOSIS — E039 Hypothyroidism, unspecified: Secondary | ICD-10-CM | POA: Diagnosis present

## 2023-10-09 DIAGNOSIS — Z8249 Family history of ischemic heart disease and other diseases of the circulatory system: Secondary | ICD-10-CM

## 2023-10-09 DIAGNOSIS — Z79890 Hormone replacement therapy (postmenopausal): Secondary | ICD-10-CM

## 2023-10-09 DIAGNOSIS — R059 Cough, unspecified: Secondary | ICD-10-CM | POA: Diagnosis not present

## 2023-10-09 DIAGNOSIS — E46 Unspecified protein-calorie malnutrition: Secondary | ICD-10-CM | POA: Diagnosis present

## 2023-10-09 DIAGNOSIS — Z85828 Personal history of other malignant neoplasm of skin: Secondary | ICD-10-CM | POA: Diagnosis not present

## 2023-10-09 DIAGNOSIS — Z888 Allergy status to other drugs, medicaments and biological substances status: Secondary | ICD-10-CM | POA: Diagnosis not present

## 2023-10-09 LAB — TROPONIN I (HIGH SENSITIVITY): Troponin I (High Sensitivity): 17 ng/L (ref <18)

## 2023-10-09 LAB — BASIC METABOLIC PANEL WITH GFR
Anion gap: 8 (ref 5–15)
BUN: 14 mg/dL (ref 8–23)
CO2: 26 mmol/L (ref 22–32)
Calcium: 8.6 mg/dL — ABNORMAL LOW (ref 8.9–10.3)
Chloride: 97 mmol/L — ABNORMAL LOW (ref 98–111)
Creatinine, Ser: 0.91 mg/dL (ref 0.44–1.00)
GFR, Estimated: 57 mL/min — ABNORMAL LOW (ref >60)
Glucose, Bld: 105 mg/dL — ABNORMAL HIGH (ref 70–99)
Potassium: 4.1 mmol/L (ref 3.5–5.1)
Sodium: 131 mmol/L — ABNORMAL LOW (ref 135–145)

## 2023-10-09 LAB — RESP PANEL BY RT-PCR (RSV, FLU A&B, COVID)  RVPGX2
Influenza A by PCR: NEGATIVE
Influenza B by PCR: NEGATIVE
Resp Syncytial Virus by PCR: NEGATIVE
SARS Coronavirus 2 by RT PCR: NEGATIVE

## 2023-10-09 LAB — CBC
HCT: 38.9 % (ref 36.0–46.0)
Hemoglobin: 12.8 g/dL (ref 12.0–15.0)
MCH: 31 pg (ref 26.0–34.0)
MCHC: 32.9 g/dL (ref 30.0–36.0)
MCV: 94.2 fL (ref 80.0–100.0)
Platelets: 251 10*3/uL (ref 150–400)
RBC: 4.13 MIL/uL (ref 3.87–5.11)
RDW: 13.9 % (ref 11.5–15.5)
WBC: 17.4 10*3/uL — ABNORMAL HIGH (ref 4.0–10.5)
nRBC: 0 % (ref 0.0–0.2)

## 2023-10-09 MED ORDER — FLUTICASONE PROPIONATE 50 MCG/ACT NA SUSP
2.0000 | Freq: Every day | NASAL | Status: DC
  Filled 2023-10-09: qty 16

## 2023-10-09 MED ORDER — FERROUS SULFATE 325 (65 FE) MG PO TABS
325.0000 mg | ORAL_TABLET | Freq: Every day | ORAL | Status: DC
  Administered 2023-10-09 – 2023-10-11 (×3): 325 mg via ORAL
  Filled 2023-10-09 (×3): qty 1

## 2023-10-09 MED ORDER — SODIUM CHLORIDE 0.9 % IV SOLN
1.0000 g | Freq: Once | INTRAVENOUS | Status: AC
  Administered 2023-10-09: 1 g via INTRAVENOUS
  Filled 2023-10-09: qty 10

## 2023-10-09 MED ORDER — AMIODARONE HCL 200 MG PO TABS
200.0000 mg | ORAL_TABLET | Freq: Every day | ORAL | Status: DC
  Administered 2023-10-10: 200 mg via ORAL
  Filled 2023-10-09: qty 1

## 2023-10-09 MED ORDER — ACETAMINOPHEN 325 MG PO TABS: 650.0000 mg | ORAL_TABLET | Freq: Four times a day (QID) | ORAL | Status: DC | PRN

## 2023-10-09 MED ORDER — MAGNESIUM CHLORIDE 64 MG PO TBEC
2.0000 | DELAYED_RELEASE_TABLET | Freq: Every day | ORAL | Status: DC
  Administered 2023-10-09 – 2023-10-11 (×3): 128 mg via ORAL
  Filled 2023-10-09 (×3): qty 2

## 2023-10-09 MED ORDER — LACTATED RINGERS IV BOLUS
1000.0000 mL | Freq: Once | INTRAVENOUS | Status: AC
  Administered 2023-10-09: 1000 mL via INTRAVENOUS

## 2023-10-09 MED ORDER — ONDANSETRON HCL 4 MG PO TABS: 4.0000 mg | ORAL_TABLET | Freq: Every day | ORAL | Status: DC | PRN

## 2023-10-09 MED ORDER — HYDROCORTISONE ACETATE 25 MG RE SUPP
25.0000 mg | Freq: Two times a day (BID) | RECTAL | Status: DC | PRN
  Filled 2023-10-09: qty 1

## 2023-10-09 MED ORDER — LEVOTHYROXINE SODIUM 50 MCG PO TABS
50.0000 ug | ORAL_TABLET | Freq: Every day | ORAL | Status: DC
  Administered 2023-10-10 – 2023-10-11 (×2): 50 ug via ORAL
  Filled 2023-10-09 (×2): qty 1

## 2023-10-09 MED ORDER — FIDAXOMICIN 200 MG PO TABS
200.0000 mg | ORAL_TABLET | Freq: Two times a day (BID) | ORAL | Status: DC
  Administered 2023-10-09 – 2023-10-11 (×4): 200 mg via ORAL
  Filled 2023-10-09 (×4): qty 1

## 2023-10-09 MED ORDER — SODIUM CHLORIDE 0.9 % IV SOLN
500.0000 mg | Freq: Once | INTRAVENOUS | Status: AC
  Administered 2023-10-09: 500 mg via INTRAVENOUS
  Filled 2023-10-09: qty 5

## 2023-10-09 MED ORDER — PANTOPRAZOLE SODIUM 40 MG PO TBEC
40.0000 mg | DELAYED_RELEASE_TABLET | Freq: Every day | ORAL | Status: DC
  Administered 2023-10-09 – 2023-10-11 (×3): 40 mg via ORAL
  Filled 2023-10-09 (×3): qty 1

## 2023-10-09 MED ORDER — SODIUM CHLORIDE 0.9 % IV SOLN
2.0000 g | INTRAVENOUS | Status: DC
  Administered 2023-10-10 – 2023-10-11 (×2): 2 g via INTRAVENOUS
  Filled 2023-10-09 (×2): qty 20

## 2023-10-09 MED ORDER — APIXABAN 2.5 MG PO TABS
2.5000 mg | ORAL_TABLET | Freq: Two times a day (BID) | ORAL | Status: DC
  Administered 2023-10-09 – 2023-10-11 (×4): 2.5 mg via ORAL
  Filled 2023-10-09 (×4): qty 1

## 2023-10-09 MED ORDER — SODIUM CHLORIDE 0.9 % IV SOLN
500.0000 mg | INTRAVENOUS | Status: DC
  Administered 2023-10-10 – 2023-10-11 (×2): 500 mg via INTRAVENOUS
  Filled 2023-10-09 (×3): qty 5

## 2023-10-09 MED ORDER — AMIODARONE HCL 200 MG PO TABS
200.0000 mg | ORAL_TABLET | Freq: Once | ORAL | Status: AC
  Administered 2023-10-09: 200 mg via ORAL
  Filled 2023-10-09: qty 1

## 2023-10-09 MED ORDER — METOPROLOL TARTRATE 25 MG PO TABS: 25.0000 mg | ORAL_TABLET | Freq: Two times a day (BID) | ORAL | Status: DC

## 2023-10-09 NOTE — H&P (Signed)
 History and Physical    Patient: Kylie May:[id number] DOB: [birth year] DOA: 10/09/2023 DOS: the patient was seen and examined on 10/09/2023 PCP: Bluford Burkitt, NP  Patient coming from: Home  Chief Complaint:  Chief Complaint  Patient presents with   Weakness   HPI: Kylie May is a 89 y.o. female with medical history significant of malnutrition, A-fib on Eliquis , GERD, hypothyroidism is being admitted for possible pneumonia. She was in [place] over the past weekend for a wedding. Upon return from there, she started with some sore throat, cough and weakness on Mon/Tuesday. She lives alone with 2 daughters nearby and also grandson. She continued with some cough followed by SOB. She was very weak to even stand up for last few days and family went into check her. They checked her O2 which was in mid 80s so called EMS who confirmed hypoxia (85% on RA) and placed her on 4 liters. She also had fever of 101 at home per family report. While in ED, her covid, flu test neg, CXR concerning for possible infiltrate/atelectasis at the basis. Daughter and Dietra Fraction at the bedside who provided most information.    Review of Systems: As mentioned in the history of present illness. All other systems reviewed and are negative. Past Medical History:  Diagnosis Date   A-fib (HCC)    Acid reflux disease    Arrhythmia    Atrial fibrillation with RVR (HCC) 08/15/2018   Chronic diastolic CHF (congestive heart failure) (HCC)    Compression fracture of lumbar vertebra (HCC)    Diverticulitis 01-2008   [facility] [place]   Diverticulosis    Esophageal stricture 2006   Femoral bruit    Right   Fibrocystic breast disease    Hiatal hernia    History of esophageal stricture 06/14/2004   2006 esophageal dilation Schatzki's ring    History of skin cancer 12/29/2008   Basal cell cancer right glabella 07/10/2010 Dr. Tresa Frohlich, [facility] @ [place] , [place]    Hypercholesterolemia    [place] study LDL goal = < 160    Mitral regurgitation    a. 06/2014 EF 55-60%, elevated end-diastolic pressures, dilated LA at 4.3 cm, mildly dilated RA, severe mitral regurgitation, mild aortic sclerosis without stenosis, mod-severe TR   Mitral valve prolapse    Osteoporosis    Dr Steve El   Pancreatitis 07-2008   Hospitalized    Patella fracture 05/13/2018   Schatzki's ring    Sepsis (HCC) 08/15/2018   Skin cancer    facial x 2. Dr Tresa Frohlich   Past Surgical History:  Procedure Laterality Date   BILIARY STENT PLACEMENT  08/21/2018   Procedure: BILIARY STENT PLACEMENT;  Surgeon: Genell Ken, MD;  Location: [facility] ENDOSCOPY;  Service: Gastroenterology;;   COLONOSCOPY  2006   Epidural steroids     x1   ERCP N/A 08/21/2018   Procedure: ENDOSCOPIC RETROGRADE CHOLANGIOPANCREATOGRAPHY (ERCP);  Surgeon: Genell Ken, MD;  Location: [facility] ENDOSCOPY;  Service: Gastroenterology;  Laterality: N/A;   ESOPHAGEAL DILATION  2006   ESOPHAGOGASTRODUODENOSCOPY (EGD) WITH PROPOFOL  N/A 11/16/2018   Procedure: ESOPHAGOGASTRODUODENOSCOPY (EGD) WITH PROPOFOL ;  Surgeon: Selena Daily, MD;  Location: [facility] ENDOSCOPY;  Service: Gastroenterology;  Laterality: N/A;   G3 P4 (twins)     IR CHOLANGIOGRAM EXISTING TUBE  08/18/2018   IR CHOLANGIOGRAM EXISTING TUBE  07/16/2022   IR CHOLANGIOGRAM EXISTING TUBE  04/28/2023   IR EXCHANGE BILIARY DRAIN  11/03/2018   IR EXCHANGE BILIARY DRAIN  12/01/2018   IR EXCHANGE BILIARY DRAIN  05/13/2019   IR EXCHANGE BILIARY DRAIN  06/14/2019   IR EXCHANGE BILIARY DRAIN  07/19/2019   IR EXCHANGE BILIARY DRAIN  08/17/2019   IR EXCHANGE BILIARY DRAIN  09/30/2019   IR EXCHANGE BILIARY DRAIN  12/08/2019   IR EXCHANGE BILIARY DRAIN  01/19/2020   IR EXCHANGE BILIARY DRAIN  03/01/2020   IR EXCHANGE BILIARY DRAIN  04/20/2020   IR EXCHANGE BILIARY DRAIN  07/06/2020   IR EXCHANGE BILIARY DRAIN  10/26/2020   IR EXCHANGE BILIARY DRAIN  01/18/2021   IR EXCHANGE BILIARY DRAIN  03/05/2021   IR EXCHANGE BILIARY DRAIN  05/25/2021   IR EXCHANGE  BILIARY DRAIN  08/03/2021   IR EXCHANGE BILIARY DRAIN  09/28/2021   IR EXCHANGE BILIARY DRAIN  11/23/2021   IR EXCHANGE BILIARY DRAIN  12/27/2021   IR EXCHANGE BILIARY DRAIN  02/20/2022   IR EXCHANGE BILIARY DRAIN  04/17/2022   IR EXCHANGE BILIARY DRAIN  06/17/2022   IR EXCHANGE BILIARY DRAIN  07/12/2022   IR EXCHANGE BILIARY DRAIN  08/12/2022   IR EXCHANGE BILIARY DRAIN  09/20/2022   IR EXCHANGE BILIARY DRAIN  11/01/2022   IR EXCHANGE BILIARY DRAIN  12/31/2022   IR EXCHANGE BILIARY DRAIN  02/25/2023   IR EXCHANGE BILIARY DRAIN  03/12/2023   IR EXCHANGE BILIARY DRAIN  03/25/2023   IR EXCHANGE BILIARY DRAIN  05/12/2023   IR EXCHANGE BILIARY DRAIN  06/20/2023   IR PERC CHOLECYSTOSTOMY  08/16/2018   IR RADIOLOGIST EVAL & MGMT  11/08/2021   IR RADIOLOGIST EVAL & MGMT  01/10/2023   REMOVAL OF STONES  08/21/2018   Procedure: REMOVAL OF STONES;  Surgeon: Genell Ken, MD;  Location: [facility] ENDOSCOPY;  Service: Gastroenterology;;   SKIN CANCER EXCISION     SPHINCTEROTOMY  08/21/2018   Procedure: [address];  Surgeon: Genell Ken, MD;  Location: [facility] ENDOSCOPY;  Service: Gastroenterology;;   Social History:  reports that she quit smoking about 72 years ago. Her smoking use included cigarettes. She has been exposed to tobacco smoke. She has never used smokeless tobacco. She reports that she does not currently use alcohol after a past usage of about 1.0 standard drink of alcohol per week. She reports that she does not use drugs.  Allergies  Allergen Reactions   Metronidazole      Other reaction(s): Dizziness and giddiness (finding), Other (qualifier value) made things feel like they were vibrating like the bed and floor,also caused mild dizziness weakness   Antihistamines, Loratadine -Type Rash    Peeling skin    Penicillin G Rash    Family History  Problem Relation Age of Onset   Breast cancer Maternal Aunt    Hepatitis Daughter        C   Lung disease Mother        lung tumor   Heart attack Daughter 46        S/P stents   Liver cancer Daughter    Heart attack Sister 62   Diabetes Neg Hx    Stroke Neg Hx     Prior to Admission medications   Medication Sig Start Date End Date Taking? Authorizing Provider  acetaminophen  (TYLENOL ) 325 MG tablet Take 2 tablets (650 mg total) by mouth every 6 (six) hours as needed for mild pain (or Fever >/= 101). 05/26/21  Yes Wieting, Richard, MD  amiodarone  (PACERONE ) 200 MG tablet Take 1 tablet (200 mg total) by mouth daily. 05/26/23  Yes Kent Pear, MD  apixaban  (ELIQUIS ) 2.5 MG TABS tablet Take 1  tablet (2.5 mg total) by mouth 2 (two) times daily. 08/02/22  Yes Gollan, Timothy J, MD  azelastine  (ASTELIN ) 0.1 % nasal spray Place 2 sprays into both nostrils 2 (two) times daily. Use in each nostril as directed 09/04/22  Yes Kent Pear, MD  CRANBERRY PO Take 2 tablets by mouth daily.   Yes [provider]  famotidine  (PEPCID ) 40 MG tablet Take 1 tablet (40 mg total) by mouth at bedtime. 08/26/23  Yes Vanga, Elson Halon, MD  ferrous sulfate  325 (65 FE) MG EC tablet Take 325 mg by mouth daily.   Yes [provider]  fluticasone  (FLONASE ) 50 MCG/ACT nasal spray Place 2 sprays into both nostrils daily. 06/06/23  Yes Kent Pear, MD  levothyroxine  (SYNTHROID ) 50 MCG tablet Take 1 tablet (50 mcg total) by mouth daily before breakfast. 07/09/23  Yes Bluford Burkitt, NP  magnesium  chloride (SLOW-MAG) 64 MG TBEC SR tablet Take 2 tablets (128 mg total) by mouth daily. 12/18/17  Yes Kent Pear, MD  montelukast  (SINGULAIR ) 10 MG tablet TAKE 1 TABLET(10 MG) BY MOUTH AT BEDTIME 06/16/23  Yes Kent Pear, MD  nystatin  cream (MYCOSTATIN )    Yes [provider]  omeprazole  (PRILOSEC) 40 MG capsule TAKE ONE CAPSULE BY MOUTH EVERY MORNING AND 1 AT BEDTIME AS DIRECTED 05/27/23  Yes Kent Pear, MD  ondansetron  (ZOFRAN ) 4 MG tablet Take 1 tablet (4 mg total) by mouth daily as needed for nausea or vomiting. 03/12/23 03/11/24 Yes Wouk,  Haynes Lips, MD  Probiotic Product (PROBIOTIC PO) Take 1 capsule by mouth 2 (two) times daily.   Yes [provider]  fidaxomicin  (DIFICID ) 200 MG TABS tablet Take 1 tablet (200 mg total) by mouth 2 (two) times daily. Patient not taking: Reported on 10/09/2023 09/15/23   Vanga, Rohini Reddy, MD  metoprolol  tartrate (LOPRESSOR ) 25 MG tablet Take 25 mg by mouth 2 (two) times daily. Patient not taking: Reported on 10/09/2023 03/22/23   [provider]    Physical Exam: Vitals:   10/09/23 1800 10/09/23 1827 10/09/23 1830 10/09/23 1930  BP: 124/62  128/61 130/83  Pulse: (!) 58  74 78  Resp: 20  15 (!) 21  Temp:  99.1 F (37.3 C)    TempSrc:  Oral    SpO2: 98%  95% 100%   General exam: Appears calm and comfortable  Respiratory system: Clear to auscultation. Respiratory effort normal. Cardiovascular system: S1 & S2 heard, RRR.  Gastrointestinal system: Abdomen is soft, benign Central nervous system: sleepy, No focal neurological deficits. Skin: No rashes, lesions or ulcers Psychiatry: Judgement and insight appear normal. Mood & affect appropriate.   Data Reviewed:  N 131, WBC 17.4  Assessment and Plan:  89 y.o. female with medical history significant of malnutrition, A-fib on Eliquis , GERD, hypothyroidism is being admitted for possible pneumonia/URI  Suspected pneumonia/URI - fever, leukocytosis, cough, sob - neg Flu, COVID, will check respi panel - urine legionella, sputum c/s - Rocephin  + Zithro for now. GI Dr Baldomero Bone recommends to continue Difficid due to h/o recurrent c.diff with any Abx - CT chest in am as CXR not diagnostic - monitor fever curve  Permanent A.fib Amiodarone  for rate control and Eliquis  for Anticoagulation  Hypothyroidism Continue levothyroxine , check TFT  Iron Deficiency Anemia Continue PO iron  Generalized Weakness PT, OT c/s    Advance Care Planning:   Code Status: Full Code confirmed with Family. Will c/s PC  Consults: Palliative  care  Family Communication: d/w  daughter and grandson at bedside. They would like to get her back home before Sunday for Mother's day.  Severity of Illness: The appropriate patient status for this patient is INPATIENT. Inpatient status is judged to be reasonable and necessary in order to provide the required intensity of service to ensure the patient's safety. The patient's presenting symptoms, physical exam findings, and initial radiographic and laboratory data in the context of their chronic comorbidities is felt to place them at high risk for further clinical deterioration. Furthermore, it is not anticipated that the patient will be medically stable for discharge from the hospital within 2 midnights of admission.   * I certify that at the point of admission it is my clinical judgment that the patient will require inpatient hospital care spanning beyond 2 midnights from the point of admission due to high intensity of service, high risk for further deterioration and high frequency of surveillance required.*  Author: Brenna Cam, MD 10/09/2023 7:55 PM  For on call review [email or web address].

## 2023-10-09 NOTE — Telephone Encounter (Signed)
 Copied from CRM [id number]. Topic: Clinical - Red Word Triage >> Oct 09, 2023 12:50 PM Shereese L wrote: [facility] that prompted transfer to Nurse Triage: Fever 101, fatigue, oxygen levels are 90 and below since Tuesday   Chief Complaint: weakness Symptoms: too weak to stand, severe weakness, oxygen low 85-90%, deep wet cough, fever, BP 151/77 Frequency: sudden Pertinent Negatives: Patient daughter denies clammy/cold sweats, change in color Disposition: [x] 911 / [] ED /[] Urgent Care (no appt availability in office) / [] Appointment(In office/virtual)/ []  [facility]/ [] Home Care/ [] Refused Recommended Disposition /[] [facility]/ []  Follow-up with PCP Additional Notes: Pt daughter reporting that today pt is "so weak can hardly get her up out of bed, normally gets up at crack of dawn but didn't get up until noon," confirms too weak to stand. Pt daughter reporting pt is "usually pretty feisty" but not herself, pt is currently responsive but "not normal," oxygen much lower than normal, her usual pulse ox is 98-99%, today pulse ox is 85-90%. Pt daughter reporting pt also has "deep wet cough," fever 101 F when "usually subnormal on temp" so even higher for her, and BP 151/77. Pt daughter reporting that some symptoms started on Tuesday, seemed a little bit better on Wednesday but today worse, first time daughter able to visit pt this week. Advised call 911 immediately, pt daughter not wanting to wait at hospital. Advised strongly that call 911 for severe symptoms, high priority with this severity. Pt daughter verbalized understanding and agreed for nurse to call 911. Nurse remained on line with daughter until paramedics arrived.  Reason for Disposition  [1] SEVERE weakness (i.e., unable to walk or barely able to walk, requires support) AND [2] new-onset or worsening  Answer Assessment - Initial Assessment Questions 2. SEVERITY: "How bad is it?"  "Can you stand and walk?"   - MILD (0-3):  Feels weak or tired, but does not interfere with work, school or normal activities.   - MODERATE (4-7): Able to stand and walk; weakness interferes with work, school, or normal activities.   - SEVERE (8-10): Unable to stand or walk; unable to do usual activities.     severe 6. OTHER SYMPTOMS: "Do you have any other symptoms?" (e.g., chest pain, fever, cough, SOB, vomiting, diarrhea, bleeding, other areas of pain)     So weak can hardly get her up out of bed, normally gets up at crack of dawn but didn't get up until noon, today, too weak to stand Oxygen is low much lower than normal 98-99% 85-90% today Deep wet cough BP 151/77 Fever 101, usually subnormal on temp Some of it started on Tuesday seemed a little bit better on Wednesday, first time daughter in this week Responding but not normal  Protocols used: Weakness (Generalized) and Fatigue-A-AH

## 2023-10-09 NOTE — ED Triage Notes (Signed)
 See first nurse note.  Was at a wedding in [place] this past weekend.

## 2023-10-09 NOTE — ED Provider Notes (Signed)
 [facility] Provider Note    Event Date/Time   First MD Initiated Contact with Patient 10/09/23 1555     (approximate)   History   Weakness   HPI Kylie May is a 89 y.o. female with history of malnutrition, A-fib on Eliquis , GERD, hypothyroidism presenting today for weakness and shortness of breath.  Patient was reportedly out of town over the weekend at a wedding when she started to have some weakness and cough.  Her cough continued up until today associated with some shortness of breath and difficulty standing due to her weakness.  Family took vital signs at home and found her oxygen to be in the mid 5s and EMS was called.  EMS found her to be 85% on room air with productive cough.  Placed on 4 L.  Reportedly had a temperature of 101 F at home.     Physical Exam   Triage Vital Signs: ED Triage Vitals  Encounter Vitals Group     BP 10/09/23 1359 (!) 122/53     Systolic BP Percentile --      Diastolic BP Percentile --      Pulse Rate 10/09/23 1359 82     Resp 10/09/23 1359 20     Temp 10/09/23 1359 99.6 F (37.6 C)     Temp Source 10/09/23 1359 Oral     SpO2 10/09/23 1359 96 %     Weight --      Height --      Head Circumference --      Peak Flow --      Pain Score 10/09/23 1404 0     Pain Loc --      Pain Education --      Exclude from Growth Chart --     Most recent vital signs: Vitals:   10/09/23 1359 10/09/23 1630  BP: (!) 122/53 (!) 109/57  Pulse: 82 77  Resp: 20 (!) 25  Temp: 99.6 F (37.6 C)   SpO2: 96% 99%   Physical Exam: I have reviewed the vital signs and nursing notes. General: Awake, alert, no acute distress.  Fatigued appearing. Head:  Atraumatic, normocephalic.   ENT:  EOM intact, PERRL. Oral mucosa is pink and moist with no lesions. Neck: Neck is supple with full range of motion, No meningeal signs. Cardiovascular:  RRR, No murmurs. Peripheral pulses palpable and equal bilaterally. Respiratory:  Symmetrical  chest wall expansion.  Slight tachypnea with diminished air movements in the bases.  Slight crackles in the bases. Musculoskeletal:  No cyanosis or edema. Moving extremities with full ROM Abdomen:  Soft, nontender, nondistended. Neuro:  GCS 15, moving all four extremities, interacting appropriately. Speech clear. Psych:  Calm, appropriate.   Skin:  Warm, dry, no rash.    ED Results / Procedures / Treatments   Labs (all labs ordered are listed, but only abnormal results are displayed) Labs Reviewed  BASIC METABOLIC PANEL WITH GFR - Abnormal; Notable for the following components:      Result Value   Sodium 131 (*)    Chloride 97 (*)    Glucose, Bld 105 (*)    Calcium  8.6 (*)    GFR, Estimated 57 (*)    All other components within normal limits  CBC - Abnormal; Notable for the following components:   WBC 17.4 (*)    All other components within normal limits  RESP PANEL BY RT-PCR (RSV, FLU A&B, COVID)  RVPGX2  TROPONIN I (HIGH SENSITIVITY)  TROPONIN I (HIGH SENSITIVITY)     EKG My EKG interpretation: Rate of 84, atrial fibrillation, left axis deviation.  No acute ST elevations or depressions.   RADIOLOGY Independently interpreted chest x-ray with infiltrates in the lower bases concerning for pneumonia   PROCEDURES:  Critical Care performed: Yes, see critical care procedure note(s)  .Critical Care  Performed by: Kandee Orion, MD Authorized by: Kandee Orion, MD   Critical care provider statement:    Critical care time (minutes):  30   Critical care was necessary to treat or prevent imminent or life-threatening deterioration of the following conditions:  Sepsis and respiratory failure   Critical care was time spent personally by me on the following activities:  Development of treatment plan with patient or surrogate, discussions with consultants, evaluation of patient's response to treatment, examination of patient, ordering and review of laboratory studies, ordering and  review of radiographic studies, ordering and performing treatments and interventions, pulse oximetry, re-evaluation of patient's condition and review of old charts   I assumed direction of critical care for this patient from another provider in my specialty: no     Care discussed with: admitting provider      MEDICATIONS ORDERED IN ED: Medications  cefTRIAXone  (ROCEPHIN ) 1 g in sodium chloride  0.9 % 100 mL IVPB (has no administration in time range)  azithromycin  (ZITHROMAX ) 500 mg in sodium chloride  0.9 % 250 mL IVPB (has no administration in time range)  lactated ringers  bolus 1,000 mL (1,000 mLs Intravenous New Bag/Given 10/09/23 1724)  amiodarone  (PACERONE ) tablet 200 mg (200 mg Oral Given 10/09/23 1732)     IMPRESSION / MDM / ASSESSMENT AND PLAN / ED COURSE  I reviewed the triage vital signs and the nursing notes.                              Differential diagnosis includes, but is not limited to, pneumonia, COVID, flu, RSV, dehydration  Patient's presentation is most consistent with acute presentation with potential threat to life or bodily function.  Patient is a 89 year old female presenting today for cough, shortness of breath, and weakness.  Found to be hypoxic at 85% and placed on 4 L oxygen.  Slight tachypnea but no tachycardia or hypotension.  Given 1 L of fluids.  Elevated white blood cell count at 17.4.  BMP otherwise largely reassuring aside from mild hyponatremia and hypochloremia.  Chest x-ray shows concerns for infiltrates which would be consistent with pneumonia.  Negative for COVID, flu, RSV.  Started on ceftriaxone  azithromycin .  Otherwise stable on reassessment and admitted to hospitalist for treatment of pneumonia with hypoxia.  The patient is on the cardiac monitor to evaluate for evidence of arrhythmia and/or significant heart rate changes.     FINAL CLINICAL IMPRESSION(S) / ED DIAGNOSES   Final diagnoses:  Bacterial pneumonia  Acute hypoxic respiratory failure  (HCC)     Rx / DC Orders   ED Discharge Orders     None        Note:  This document was prepared using Dragon voice recognition software and may include unintentional dictation errors.   Kandee Orion, MD 10/09/23 [id number]

## 2023-10-09 NOTE — ED Triage Notes (Signed)
 First Nurse Note: Patient to ED via [facility] from home for hypoxia. Initially 85% RA. Productive cough x2 days. PT hard of hearing.   Currently on 4L 94% 99.9  130/66 76 HR 114 cbg

## 2023-10-10 ENCOUNTER — Inpatient Hospital Stay

## 2023-10-10 DIAGNOSIS — J189 Pneumonia, unspecified organism: Secondary | ICD-10-CM | POA: Diagnosis not present

## 2023-10-10 DIAGNOSIS — E039 Hypothyroidism, unspecified: Secondary | ICD-10-CM | POA: Diagnosis not present

## 2023-10-10 DIAGNOSIS — I4821 Permanent atrial fibrillation: Secondary | ICD-10-CM | POA: Diagnosis not present

## 2023-10-10 LAB — CBC
HCT: 36.5 % (ref 36.0–46.0)
Hemoglobin: 12 g/dL (ref 12.0–15.0)
MCH: 30.6 pg (ref 26.0–34.0)
MCHC: 32.9 g/dL (ref 30.0–36.0)
MCV: 93.1 fL (ref 80.0–100.0)
Platelets: 228 10*3/uL (ref 150–400)
RBC: 3.92 MIL/uL (ref 3.87–5.11)
RDW: 14 % (ref 11.5–15.5)
WBC: 12.6 10*3/uL — ABNORMAL HIGH (ref 4.0–10.5)
nRBC: 0 % (ref 0.0–0.2)

## 2023-10-10 LAB — BASIC METABOLIC PANEL WITH GFR
Anion gap: 7 (ref 5–15)
BUN: 19 mg/dL (ref 8–23)
CO2: 24 mmol/L (ref 22–32)
Calcium: 8.7 mg/dL — ABNORMAL LOW (ref 8.9–10.3)
Chloride: 100 mmol/L (ref 98–111)
Creatinine, Ser: 0.89 mg/dL (ref 0.44–1.00)
GFR, Estimated: 58 mL/min — ABNORMAL LOW (ref >60)
Glucose, Bld: 95 mg/dL (ref 70–99)
Potassium: 4.2 mmol/L (ref 3.5–5.1)
Sodium: 131 mmol/L — ABNORMAL LOW (ref 135–145)

## 2023-10-10 LAB — PROCALCITONIN: Procalcitonin: 0.16 ng/mL

## 2023-10-10 LAB — TROPONIN I (HIGH SENSITIVITY): Troponin I (High Sensitivity): 16 ng/L (ref <18)

## 2023-10-10 LAB — BRAIN NATRIURETIC PEPTIDE: B Natriuretic Peptide: 224.7 pg/mL — ABNORMAL HIGH (ref 0.0–100.0)

## 2023-10-10 NOTE — Evaluation (Signed)
 Physical Therapy Evaluation Patient Details Name: Kylie May MRN: [id number] DOB: December 19, 1921 Today's Date: 10/10/2023  History of Present Illness  Pt is a 89 y.o. female presented with possible pneumonia and generalized weakness. PMH of malnutrition, A-fib on Eliquis , GERD, hypothyroidism  Clinical Impression  Patient admitted with the above. PTA, patient lives with family providing 24/7 assistance/supervision with patient using RW for mobility and assistance for ADLs from family. Patient is severely HOH and grandson present to translate via typing on phone. Required supervision for bed mobility and CGA for sit to stand with RW. Ambulated within room with RW and CGA for safety due to impulsivity which grandson states is baseline and they've told her to slow down. VSS on 2L O2 during session. Patient will benefit from skilled PT services during acute stay to address listed deficits.         If plan is discharge home, recommend the following: A little help with walking and/or transfers;A little help with bathing/dressing/bathroom;Assistance with cooking/housework;Assist for transportation;Help with stairs or ramp for entrance   Can travel by private vehicle        Equipment Recommendations None recommended by PT  Recommendations for Other Services       Functional Status Assessment Patient has had a recent decline in their functional status and demonstrates the ability to make significant improvements in function in a reasonable and predictable amount of time.     Precautions / Restrictions Precautions Precautions: Fall Restrictions Weight Bearing Restrictions Per Provider Order: No      Mobility  Bed Mobility Overal bed mobility: Needs Assistance Bed Mobility: Supine to Sit     Supine to sit: Supervision          Transfers Overall transfer level: Needs assistance Equipment used: Rolling Taneah Masri (2 wheels) Transfers: Sit to/from Stand Sit to Stand: Contact guard  assist                Ambulation/Gait Ambulation/Gait assistance: Contact guard assist Gait Distance (Feet): 15 Feet Assistive device: Rolling Ilka Lovick (2 wheels) Gait Pattern/deviations: Step-through pattern, Decreased stride length, Trunk flexed   Gait velocity interpretation: >2.62 ft/sec, indicative of community ambulatory   General Gait Details: impulsive with mobility. Grandson states this is baseline and they frequently telling her to slow down  Careers information officer     Tilt Bed    Modified Rankin (Stroke Patients Only)       Balance Overall balance assessment: Needs assistance Sitting-balance support: No upper extremity supported, Feet supported Sitting balance-Leahy Scale: Good     Standing balance support: Bilateral upper extremity supported, Reliant on assistive device for balance Standing balance-Leahy Scale: Fair                               Pertinent Vitals/Pain Pain Assessment Pain Assessment: No/denies pain    Home Living Family/patient expects to be discharged to:: Private residence Living Arrangements: Other (Comment) (family provides 24/7 assistance) Available Help at Discharge: Family;Available 24 hours/day Type of Home: House Home Access: Stairs to enter Entrance Stairs-Rails: None Entrance Stairs-Number of Steps: 1 threshold   Home Layout: Two level;Able to live on main level with bedroom/bathroom Home Equipment: Rolling Alayne Estrella (2 wheels);Cane - single point;Wheelchair - manual;Toilet riser      Prior Function Prior Level of Function : Needs assist  Mobility Comments: uses RW to amb household distance with supervision ADLs Comments: Pt requires assist for IADLs (cooking, cleaning) and bathing from family     Extremity/Trunk Assessment   Upper Extremity Assessment Upper Extremity Assessment: Defer to OT evaluation    Lower Extremity Assessment Lower Extremity Assessment:  Generalized weakness    Cervical / Trunk Assessment Cervical / Trunk Assessment: Kyphotic  Communication   Communication Communication: Impaired Factors Affecting Communication: Hearing impaired (severe)    Cognition Arousal: Alert Behavior During Therapy: WFL for tasks assessed/performed   PT - Cognitive impairments: Difficult to assess Difficult to assess due to: Hard of hearing/deaf                       Following commands: Intact       Cueing       General Comments General comments (skin integrity, edema, etc.): VSS on 2L O2    Exercises     Assessment/Plan    PT Assessment Patient needs continued PT services  PT Problem List Decreased strength;Decreased activity tolerance;Decreased mobility;Decreased balance;Decreased safety awareness       PT Treatment Interventions DME instruction;Gait training;Functional mobility training;Therapeutic activities;Therapeutic exercise;Balance training;Patient/family education    PT Goals (Current goals can be found in the Care Plan section)  Acute Rehab PT Goals Patient Stated Goal: did not state PT Goal Formulation: With family Time For Goal Achievement: 10/24/23 Potential to Achieve Goals: Fair    Frequency Min 1X/week     Co-evaluation               AM-PAC PT "6 Clicks" Mobility  Outcome Measure Help needed turning from your back to your side while in a flat bed without using bedrails?: A Little Help needed moving from lying on your back to sitting on the side of a flat bed without using bedrails?: A Little Help needed moving to and from a bed to a chair (including a wheelchair)?: A Little Help needed standing up from a chair using your arms (e.g., wheelchair or bedside chair)?: A Little Help needed to walk in hospital room?: A Little Help needed climbing 3-5 steps with a railing? : A Lot 6 Click Score: 17    End of Session Equipment Utilized During Treatment: Oxygen Activity Tolerance: Patient  tolerated treatment well Patient left: in chair;with call bell/phone within reach;with family/visitor present Nurse Communication: Mobility status PT Visit Diagnosis: Muscle weakness (generalized) (M62.81);Unsteadiness on feet (R26.81)    Time: 8657-8469 PT Time Calculation (min) (ACUTE ONLY): 25 min   Charges:   PT Evaluation $PT Eval Moderate Complexity: 1 Mod   PT General Charges $$ ACUTE PT VISIT: 1 Visit         Janine Melbourne, PT, DPT Physical Therapist - [facility]  [facility]   Dalya Maselli A Bev Drennen 10/10/2023, 12:37 PM

## 2023-10-10 NOTE — Plan of Care (Signed)
  Problem: Clinical Measurements: Goal: Respiratory complications will improve Outcome: Progressing   Problem: Clinical Measurements: Goal: Cardiovascular complication will be avoided Outcome: Progressing   Problem: Pain Managment: Goal: General experience of comfort will improve and/or be controlled Outcome: Progressing   Problem: Safety: Goal: Ability to remain free from injury will improve Outcome: Progressing

## 2023-10-10 NOTE — Progress Notes (Signed)
 Patient ambulated 200 ft on room air. Oxygen saturation dropping to 83%

## 2023-10-10 NOTE — Evaluation (Signed)
 Occupational Therapy Evaluation Patient Details Name: Kylie May MRN: [id number] DOB: 1922-04-08 Today's Date: 10/10/2023   History of Present Illness   Pt is a 89 y.o. female presented with possible pneumonia and generalized weakness. PMH of malnutrition, A-fib on Eliquis , GERD, hypothyroidism     Clinical Impressions Pt was seen for OT evaluation this date. PTA, pt lives in her own home with family rotating shifts to stay with her 24/7. She is never left alone and has assist for all ADLs/IADLs. Ambulates within the home using a RW with constant SUP/SBA.  Pt presents to acute OT demonstrating impaired ADL performance and functional mobility 2/2 low activity tolerance, mild weakness. Pt currently requires SUP for bed mobility, CGA for safety with STS and ambulation around the bed to the recliner d/t speed. She is severely HOH and has family use phone to type up needs. SBA for mult STS trials from recliner to RW for LB dressing/hygiene d/t purewick malfunction. Max A for doffing/donning mesh underwear. Pt appears at her baseline, excluding need for 02. Remained 93% and above on 2L throughout session. BP improved once seated EOB and no reports of lightheadedness, etc. Pt would benefit from skilled OT services to address noted impairments and functional limitations to maximize safety and independence while minimizing falls risk and caregiver burden. Do not anticipate the need for follow up OT services upon acute hospital DC with pt safe to return home with continued 24/7 assist from family.      If plan is discharge home, recommend the following:   A little help with walking and/or transfers;A lot of help with bathing/dressing/bathroom     Functional Status Assessment   Patient has had a recent decline in their functional status and demonstrates the ability to make significant improvements in function in a reasonable and predictable amount of time.     Equipment Recommendations          Recommendations for Other Services         Precautions/Restrictions   Precautions Precautions: Fall Restrictions Weight Bearing Restrictions Per Provider Order: No     Mobility Bed Mobility Overal bed mobility: Needs Assistance Bed Mobility: Supine to Sit     Supine to sit: Supervision          Transfers Overall transfer level: Needs assistance Equipment used: Rolling walker (2 wheels) Transfers: Sit to/from Stand Sit to Stand: Contact guard assist           General transfer comment: amb in room using RW with CGA/SBA for safety d/t speed; SBA for STS transfers from recliner x2      Balance Overall balance assessment: Needs assistance Sitting-balance support: No upper extremity supported, Feet supported Sitting balance-Leahy Scale: Good     Standing balance support: Bilateral upper extremity supported, Reliant on assistive device for balance Standing balance-Leahy Scale: Fair Standing balance comment: no LOB, RW use and moves fast                           ADL either performed or assessed with clinical judgement   ADL Overall ADL's : Needs assistance/impaired;At baseline                     Lower Body Dressing: Moderate assistance;Maximal assistance;Sit to/from stand;Sitting/lateral leans                       Vision  Perception         Praxis         Pertinent Vitals/Pain Pain Assessment Pain Assessment: No/denies pain     Extremity/Trunk Assessment Upper Extremity Assessment Upper Extremity Assessment: Overall WFL for tasks assessed   Lower Extremity Assessment Lower Extremity Assessment: Generalized weakness   Cervical / Trunk Assessment Cervical / Trunk Assessment: Kyphotic   Communication Communication Communication: Impaired Factors Affecting Communication: Hearing impaired (severe)   Cognition Arousal: Alert Behavior During Therapy: WFL for tasks assessed/performed                                  Following commands: Intact       Cueing  General Comments      VSS on 2L 02   Exercises Other Exercises Other Exercises: Edu on role of OT in acute setting.   Shoulder Instructions      Home Living Family/patient expects to be discharged to:: Private residence Living Arrangements: Other (Comment) (family provides 24/7 assistance) Available Help at Discharge: Family;Available 24 hours/day Type of Home: House Home Access: Stairs to enter [employer] of Steps: 1 threshold Entrance Stairs-Rails: None Home Layout: Two level;Able to live on main level with bedroom/bathroom     Bathroom Shower/Tub: Sponge bathes at baseline         Home Equipment: Agricultural consultant (2 wheels);Cane - single point;Wheelchair - manual;Toilet riser          Prior Functioning/Environment Prior Level of Function : Needs assist             Mobility Comments: uses RW to amb household distance with supervision ADLs Comments: Pt requires assist for IADLs (cooking, cleaning) and bathing from family    OT Problem List: Decreased activity tolerance   OT Treatment/Interventions: Self-care/ADL training;Balance training;Therapeutic exercise;Therapeutic activities;Energy conservation;Patient/family education      OT Goals(Current goals can be found in the care plan section)   Acute Rehab OT Goals OT Goal Formulation: Patient unable to participate in goal setting (d/t HOH) Time For Goal Achievement: 10/24/23 Potential to Achieve Goals: Good ADL Goals Pt Will Perform Grooming: with modified independence;standing;sitting Pt Will Transfer to Toilet: with supervision;ambulating;regular height toilet;bedside commode;grab bars   OT Frequency:  Min 1X/week    Co-evaluation              AM-PAC OT "6 Clicks" Daily Activity     Outcome Measure Help from another person eating meals?: None Help from another person taking care of personal grooming?:  None Help from another person toileting, which includes using toliet, bedpan, or urinal?: A Lot Help from another person bathing (including washing, rinsing, drying)?: A Lot Help from another person to put on and taking off regular upper body clothing?: A Little Help from another person to put on and taking off regular lower body clothing?: A Lot 6 Click Score: 17   End of Session Equipment Utilized During Treatment: Rolling walker (2 wheels);Oxygen Nurse Communication: Mobility status  Activity Tolerance: Patient tolerated treatment well Patient left: in chair;with call bell/phone within reach;with family/visitor present  OT Visit Diagnosis: Other abnormalities of gait and mobility (R26.89)                Time: 1610-9604 OT Time Calculation (min): 25 min Charges:  OT General Charges $OT Visit: 1 Visit OT Evaluation $OT Eval Low Complexity: 1 Low  Estefany Goebel, OTR/L 10/10/23, 12:55 PM  Roopa Graver E Davin Archuletta 10/10/2023,  12:47 PM

## 2023-10-10 NOTE — Progress Notes (Signed)
 1      PROGRESS NOTE    Kylie May  ZOX:[id number] DOB: [birth year] DOA: 10/09/2023 PCP: Bluford Burkitt, NP   Brief Narrative:   89 y.o. female with medical history significant of malnutrition, A-fib on Eliquis , GERD, hypothyroidism is being admitted for possible pneumonia   5/9: PT, OT, palliative care consult  Assessment & Plan:   Principal Problem:   Pneumonia  Community-acquired pneumonia - fever, leukocytosis, cough, sob present on admission - neg Flu, COVID - urine legionella, sputum c/s pending - Rocephin  + Zithro for now. GI Dr Baldomero Bone recommends to continue Difficid due to h/o recurrent c.diff with any Abx - CT chest shows bibasilar pneumonia, trace right pleural effusion - monitor fever curve   Permanent A.fib Continue Eliquis  for Anticoagulation Holding amiodarone  as she is hypotensive   Hypothyroidism Continue levothyroxine , check TFT   Iron Deficiency Anemia Continue PO iron   Generalized Weakness PT, OT c/s     DVT prophylaxis:  apixaban  (ELIQUIS ) tablet 2.5 mg Start: 10/09/23 2200 apixaban  (ELIQUIS ) tablet 2.5 mg     Code Status: Full code Family Communication: Grandson updated at bedside Disposition Plan: Possible discharge in next 1 to 2 days depending on clinical condition    Antimicrobials:  Rocephin  and Zithromax    Subjective:  She is much more awake and talkative.  Afebrile, less short of breath minimal cough she does have difficulty hearing.  Grandson at bedside  Objective: Vitals:   10/10/23 0100 10/10/23 0406 10/10/23 0731 10/10/23 1114  BP:  (!) 115/53 113/63 (!) 87/45  Pulse:  67 68 65  Resp:  20 18 20   Temp:  97.6 F (36.4 C) 98.4 F (36.9 C) 98.6 F (37 C)  TempSrc:   Oral   SpO2:  100% 98% 98%  Weight: 53.1 kg     Height: 4\' 11"  (1.499 m)       Intake/Output Summary (Last 24 hours) at 10/10/2023 1523 Last data filed at 10/10/2023 1100 Gross per 24 hour  Intake 1590 ml  Output 100 ml  Net 1490 ml   Filed Weights    10/10/23 0100  Weight: 53.1 kg    Examination:  General exam: Appears calm and comfortable  Respiratory system: Clear to auscultation. Respiratory effort normal. Cardiovascular system: S1 & S2 heard, RRR. No pedal edema. Gastrointestinal system: Abdomen is soft, benign Central nervous system: Alert and oriented. No focal neurological deficits. Extremities: Symmetric 5 x 5 power. Skin: No rashes, lesions or ulcers Psychiatry: Judgement and insight appear normal. Mood & affect appropriate.     Data Reviewed: I have personally reviewed following labs and imaging studies  CBC: Recent Labs  Lab 10/09/23 1403 10/10/23 0713  WBC 17.4* 12.6*  HGB 12.8 12.0  HCT 38.9 36.5  MCV 94.2 93.1  PLT 251 228   Basic Metabolic Panel: Recent Labs  Lab 10/09/23 1403 10/10/23 0713  NA 131* 131*  K 4.1 4.2  CL 97* 100  CO2 26 24  GLUCOSE 105* 95  BUN 14 19  CREATININE 0.91 0.89  CALCIUM  8.6* 8.7*    Sepsis Labs: Recent Labs  Lab 10/09/23 2342  PROCALCITON 0.16    Recent Results (from the past 240 hours)  Resp panel by RT-PCR (RSV, Flu A&B, Covid) Anterior Nasal Swab     Status: None   Collection Time: 10/09/23  2:03 PM   Specimen: Anterior Nasal Swab  Result Value Ref Range Status   SARS Coronavirus 2 by RT PCR NEGATIVE NEGATIVE Final  Comment: (NOTE) SARS-CoV-2 target nucleic acids are NOT DETECTED.  The SARS-CoV-2 RNA is generally detectable in upper respiratory specimens during the acute phase of infection. The lowest concentration of SARS-CoV-2 viral copies this assay can detect is 138 copies/mL. A negative result does not preclude SARS-Cov-2 infection and should not be used as the sole basis for treatment or other patient management decisions. A negative result may occur with  improper specimen collection/handling, submission of specimen other than nasopharyngeal swab, presence of viral mutation(s) within the areas targeted by this assay, and inadequate number  of viral copies(<138 copies/mL). A negative result must be combined with clinical observations, patient history, and epidemiological information. The expected result is Negative.  Fact Sheet for Patients:  BloggerCourse.com  Fact Sheet for Healthcare Providers:  SeriousBroker.it  This test is no t yet approved or cleared by the [place]  FDA and  has been authorized for detection and/or diagnosis of SARS-CoV-2 by FDA under an Emergency Use Authorization (EUA). This EUA will remain  in effect (meaning this test can be used) for the duration of the COVID-19 declaration under Section 564(b)(1) of the Act, 21 U.S.C.section 360bbb-3(b)(1), unless the authorization is terminated  or revoked sooner.       Influenza A by PCR NEGATIVE NEGATIVE Final   Influenza B by PCR NEGATIVE NEGATIVE Final    Comment: (NOTE) The Xpert Xpress SARS-CoV-2/FLU/RSV plus assay is intended as an aid in the diagnosis of influenza from Nasopharyngeal swab specimens and should not be used as a sole basis for treatment. Nasal washings and aspirates are unacceptable for Xpert Xpress SARS-CoV-2/FLU/RSV testing.  Fact Sheet for Patients: BloggerCourse.com  Fact Sheet for Healthcare Providers: SeriousBroker.it  This test is not yet approved or cleared by the [place]  FDA and has been authorized for detection and/or diagnosis of SARS-CoV-2 by FDA under an Emergency Use Authorization (EUA). This EUA will remain in effect (meaning this test can be used) for the duration of the COVID-19 declaration under Section 564(b)(1) of the Act, 21 U.S.C. section 360bbb-3(b)(1), unless the authorization is terminated or revoked.     Resp Syncytial Virus by PCR NEGATIVE NEGATIVE Final    Comment: (NOTE) Fact Sheet for Patients: BloggerCourse.com  Fact Sheet for Healthcare  Providers: SeriousBroker.it  This test is not yet approved or cleared by the [place]  FDA and has been authorized for detection and/or diagnosis of SARS-CoV-2 by FDA under an Emergency Use Authorization (EUA). This EUA will remain in effect (meaning this test can be used) for the duration of the COVID-19 declaration under Section 564(b)(1) of the Act, 21 U.S.C. section 360bbb-3(b)(1), unless the authorization is terminated or revoked.  Performed at [facility], [address] [place], [place] [address]   Culture, blood (routine x 2) Call MD if unable to obtain prior to antibiotics being given     Status: None (Preliminary result)   Collection Time: 10/09/23 11:42 PM   Specimen: BLOOD  Result Value Ref Range Status   Specimen Description BLOOD RAC  Final   Special Requests   Final    BOTTLES DRAWN AEROBIC AND ANAEROBIC Blood Culture results may not be optimal due to an inadequate volume of blood received in culture bottles   Culture   Final    NO GROWTH < 12 HOURS Performed at [facility], [address] [place], [place] [address]    Report Status PENDING  Incomplete         Radiology Studies: CT CHEST WO CONTRAST Result  Date: 10/10/2023 CLINICAL DATA:  Respiratory illness EXAM: CT CHEST WITHOUT CONTRAST TECHNIQUE: Multidetector CT imaging of the chest was performed following the standard protocol without IV contrast. RADIATION DOSE REDUCTION: This exam was performed according to the departmental dose-optimization program which includes automated exposure control, adjustment of the mA and/or kV according to patient size and/or use of iterative reconstruction technique. COMPARISON:  Chest x-ray performed Oct 09, 2023 FINDINGS: Cardiovascular: Heart is enlarged. No pericardial effusion. Atherosclerotic changes are present in the thoracic aorta. No thoracic aortic aneurysm. Mediastinum/Nodes: Mildly prominent lymph nodes are present  within the mediastinum which may be reactive. Lungs/Pleura: Airspace consolidation is present in the lower lobes bilaterally. This is small to moderate in size. Trace right pleural effusion. Upper Abdomen: Increased attenuation of the liver diffusely. Motion related changes in the upper abdomen. Hiatal hernia. Musculoskeletal: Right convex thoracic curvature with diffuse osteopenia and multilevel compression deformities within the thoracic spine. A compression deformity at T6 is progressed from the prior examination. IMPRESSION: 1. Bibasilar airspace infiltrate likely sequelae of pneumonia. Small on the right and moderate on the left. 2. Trace right pleural effusion. 3. Enlarged heart. 4. Age-indeterminate compression deformity at T6 which is new when compared to CT scan performed February 20, 2023. 5. Diffuse osteopenia. Electronically Signed   By: Reagan Camera M.D.   On: 10/10/2023 09:40   DG Chest 2 View Result Date: 10/09/2023 CLINICAL DATA:  Cough.  Hypoxia. EXAM: CHEST - 2 VIEW COMPARISON:  04/04/2023. FINDINGS: Low lung volumes. Stable cardiomegaly. Aortic atherosclerosis. Similar moderate hiatal hernia. Bibasilar opacities, could reflect atelectasis or infiltrate. Scarring at the bilateral costophrenic angles. Scoliotic curvature of the thoracolumbar spine. Diffuse osseous demineralization. IMPRESSION: 1. Low lung volumes with bibasilar opacities, which could reflect atelectasis or infiltrate. 2. Similar moderate hiatal hernia. Electronically Signed   By: Mannie Seek M.D.   On: 10/09/2023 14:37        Scheduled Meds:  apixaban   2.5 mg Oral BID   ferrous sulfate   325 mg Oral Daily   fidaxomicin   200 mg Oral BID   fluticasone   2 spray Each Nare Daily   levothyroxine   50 mcg Oral QAC breakfast   magnesium  chloride  2 tablet Oral Daily   pantoprazole   40 mg Oral Daily   Continuous Infusions:  azithromycin      cefTRIAXone  (ROCEPHIN )  IV       LOS: 1 day    Time spent: 35  minutes    Brenna Cam, MD [facility] Pager [phone]  If 7PM-7AM, please contact night-coverage [email or web address]  10/10/2023, 3:23 PM

## 2023-10-10 NOTE — Progress Notes (Addendum)
 SLP Cancellation Note  Patient Details Name: Kylie May MRN: [id number] DOB: 1921-12-18   Cancelled treatment:       Reason Eval/Treat Not Completed: SLP screened, no needs identified, will sign off (chart reviewed; consulted NSG then met w/ Grandson in room.)    Pt is a 89 y.o. female presenting with possible pneumonia and generalized weakness. She was in [place] over the past weekend for a Wedding. Upon return from there, she started with some sore throat, cough and weakness on Mon/Tuesday.   PMH of malnutrition, A-fib on Eliquis , GERD, hypothyroidism.  Pt lives at home w/ Family providing around the clock care and Supervision(24/7). Pt is HOH even w/ HAs; reads Large Print, which pt's Grandson types on his phone for her. Pt w/ a congested cough; Incentive Spirometer given and instructed on w/ Grandson.    Grandson assisted during the session typing for pt to read the communication. Pt and Grandson denied any difficulty swallowing and is currently on a regular diet; tolerates swallowing pills w/ a shake ("usually at home) per [place]. Discussed the option of applesauce, yogurt for the same increased Viscosity. NSG updated. Discussed general aspiration precautions(less straw use - does not use at home). Pt conversed in basic conversation in setting of the vision/hearing challenges w/out gross expressive/receptive deficits noted; pt denied any speech-language deficits. Speech clear, volume appropriate.  No further skilled ST services indicated as pt appears at her baseline. Pt and Grandson agreed. NSG to reconsult if any change in status while admitted.     Darla Edward, MS, CCC-SLP Speech Language Pathologist Rehab Services; [facility] [phone] (ascom) Legend Tumminello 10/10/2023, 2:09 PM

## 2023-10-11 DIAGNOSIS — J9601 Acute respiratory failure with hypoxia: Secondary | ICD-10-CM

## 2023-10-11 DIAGNOSIS — Z515 Encounter for palliative care: Secondary | ICD-10-CM

## 2023-10-11 DIAGNOSIS — J159 Unspecified bacterial pneumonia: Secondary | ICD-10-CM | POA: Diagnosis not present

## 2023-10-11 DIAGNOSIS — Z7189 Other specified counseling: Secondary | ICD-10-CM

## 2023-10-11 DIAGNOSIS — J189 Pneumonia, unspecified organism: Secondary | ICD-10-CM | POA: Diagnosis not present

## 2023-10-11 DIAGNOSIS — E039 Hypothyroidism, unspecified: Secondary | ICD-10-CM | POA: Diagnosis not present

## 2023-10-11 DIAGNOSIS — Z789 Other specified health status: Secondary | ICD-10-CM | POA: Diagnosis not present

## 2023-10-11 LAB — CBC
HCT: 34.4 % — ABNORMAL LOW (ref 36.0–46.0)
Hemoglobin: 11.8 g/dL — ABNORMAL LOW (ref 12.0–15.0)
MCH: 31.8 pg (ref 26.0–34.0)
MCHC: 34.3 g/dL (ref 30.0–36.0)
MCV: 92.7 fL (ref 80.0–100.0)
Platelets: 234 10*3/uL (ref 150–400)
RBC: 3.71 MIL/uL — ABNORMAL LOW (ref 3.87–5.11)
RDW: 14 % (ref 11.5–15.5)
WBC: 8.5 10*3/uL (ref 4.0–10.5)
nRBC: 0 % (ref 0.0–0.2)

## 2023-10-11 LAB — EXPECTORATED SPUTUM ASSESSMENT W GRAM STAIN, RFLX TO RESP C

## 2023-10-11 LAB — THYROID PANEL WITH TSH
Free Thyroxine Index: 3.6 (ref 1.2–4.9)
T3 Uptake Ratio: 34 % (ref 24–39)
T4, Total: 10.6 ug/dL (ref 4.5–12.0)
TSH: 2.2 u[IU]/mL (ref 0.450–4.500)

## 2023-10-11 LAB — STREP PNEUMONIAE URINARY ANTIGEN: Strep Pneumo Urinary Antigen: NEGATIVE

## 2023-10-11 LAB — BASIC METABOLIC PANEL WITH GFR
Anion gap: 8 (ref 5–15)
BUN: 17 mg/dL (ref 8–23)
CO2: 25 mmol/L (ref 22–32)
Calcium: 8.5 mg/dL — ABNORMAL LOW (ref 8.9–10.3)
Chloride: 100 mmol/L (ref 98–111)
Creatinine, Ser: 0.8 mg/dL (ref 0.44–1.00)
GFR, Estimated: >60 mL/min (ref >60)
Glucose, Bld: 91 mg/dL (ref 70–99)
Potassium: 4.4 mmol/L (ref 3.5–5.1)
Sodium: 133 mmol/L — ABNORMAL LOW (ref 135–145)

## 2023-10-11 MED ORDER — DIFICID 200 MG PO TABS: 200.0000 mg | ORAL_TABLET | Freq: Two times a day (BID) | ORAL | 0 refills | Status: AC

## 2023-10-11 MED ORDER — AMOXICILLIN-POT CLAVULANATE 500-125 MG PO TABS: 1.0000 | ORAL_TABLET | Freq: Two times a day (BID) | ORAL | 0 refills | Status: AC

## 2023-10-11 MED ORDER — DIFICID 200 MG PO TABS: 200.0000 mg | ORAL_TABLET | Freq: Two times a day (BID) | ORAL | 0 refills | Status: DC

## 2023-10-11 MED ORDER — AMOXICILLIN-POT CLAVULANATE 500-125 MG PO TABS: 1.0000 | ORAL_TABLET | Freq: Two times a day (BID) | ORAL | 0 refills | Status: DC

## 2023-10-11 NOTE — TOC Transition Note (Signed)
 Transition of Care [facility]) - Discharge Note   Patient Details  Name: Kylie May MRN: [id number] Date of Birth: 1921/06/13  Transition [facility] CM/SW Contact:  Rendell Carrel, RN Phone Number: 10/11/2023, 11:02 AM   Clinical Narrative:    Orders received for pt to be discharged home with home oxygen. TOC RNCM spoke with daughter DPR Alethia Huxley [phone] concerning choice for O2 DME. RNCM contacted Ada with [employer] for home O2. Portable will be delivered to bedside pending concentrator set up at the home address listed in chart. All information verified and orders in place to proceed.   Daughter Mariah Shines indicates someone is always with the pt in the home setting assisting with ADL. Pt has only a walker for mobility with no reported falls (medium risk on this admission), obtains [facility] and uses a local pharmacy [facility] if needed for all other medications.  Reports pt does not have stairs and "unable to hear".   TOC remains available for any other needs.    Final next level of care: Home/Self Care Barriers to Discharge: No Barriers Identified   Patient Goals and CMS Choice   CMS Medicare.gov Compare Post Acute Care list provided to:: Patient Represenative (must comment) (Provided information to daughter Mariah Shines for [email or web address] and offered choices) Choice offered to / list presented to : Adult Children Mariah Shines)      Discharge Placement                  Name of family member notified: Alethia Huxley Patient and family notified of of transfer: 10/11/23  Discharge Plan and Services Additional resources added to the After Visit Summary for                  DME Arranged: Oxygen DME Agency: [facility] Date DME Agency Contacted: 10/11/23 Time DME Agency Contacted: 1020 Representative spoke with at DME Agency: Ada            Social Drivers of Health (SDOH) Interventions SDOH Screenings   Food Insecurity: No Food Insecurity  (10/10/2023)  Housing: Unknown (10/10/2023)  Transportation Needs: No Transportation Needs (10/10/2023)  Utilities: Not At Risk (10/10/2023)  Alcohol Screen: Low Risk  (03/24/2023)  Depression (PHQ2-9): Low Risk  (10/02/2022)  Recent Concern: Depression (PHQ2-9) - Medium Risk (09/04/2022)  Financial Resource Strain: Low Risk  (03/24/2023)  Physical Activity: Unknown (03/24/2023)  Social Connections: Unknown (10/10/2023)  Stress: No Stress Concern Present (03/24/2023)  Tobacco Use: Medium Risk (10/09/2023)     Readmission Risk Interventions     No data to display

## 2023-10-11 NOTE — Plan of Care (Signed)
  Problem: Education: Goal: Knowledge of General Education information will improve Description: Including pain rating scale, medication(s)/side effects and non-pharmacologic comfort measures Outcome: Progressing   Problem: Health Behavior/Discharge Planning: Goal: Ability to manage health-related needs will improve Outcome: Progressing   Problem: Clinical Measurements: Goal: Ability to maintain clinical measurements within normal limits will improve Outcome: Progressing Goal: Will remain free from infection Outcome: Progressing Goal: Diagnostic test results will improve Outcome: Progressing Goal: Cardiovascular complication will be avoided Outcome: Progressing   Problem: Nutrition: Goal: Adequate nutrition will be maintained Outcome: Progressing   Problem: Coping: Goal: Level of anxiety will decrease Outcome: Progressing   Problem: Elimination: Goal: Will not experience complications related to bowel motility Outcome: Progressing Goal: Will not experience complications related to urinary retention Outcome: Progressing   Problem: Skin Integrity: Goal: Risk for impaired skin integrity will decrease Outcome: Progressing   Problem: Activity: Goal: Ability to tolerate increased activity will improve Outcome: Progressing   Problem: Respiratory: Goal: Ability to maintain adequate ventilation will improve Outcome: Progressing

## 2023-10-11 NOTE — Consult Note (Signed)
 Consultation Note Date: 10/11/2023 at 1245  Patient Name: Kylie May  DOB: Dec 19, 1921  MRN: [id number]  Age / Sex: 89 y.o., female  PCP: Bluford Burkitt, NP Referring Physician: Brenna Cam, MD  HPI/Patient Profile: 89 y.o. female  with past medical history significant for malnutrition, A-fib on Eliquis , GERD, hypothyroidism, HOH, CHF and frequent falls.  She presented to ED from home via EMS 10/09/2023 complaining of shortness of breath, fever and weakness.  EMS reported oxygen saturation 85% on room air with a productive cough.  Family shared patient had recently traveled to [place] over the weekend for a wedding and started experiencing symptoms after returning home. She was placed on 4 L and transported to the ED.  ED workup found patient to be septic and in respiratory failure.  ED labs showed sodium 131, glucose 105, calcium  8.6, BNP 224.7, WBC 17.4.  CXR consistent with PNA.  COVID, flu, RSV negative.  Patient was admitted for further evaluation and treatment of pneumonia and acute hypoxic respiratory failure.  PMT consulted for goals of care discussion  Clinical Assessment and Goals of Care: Extensive chart review completed prior to meeting patient including labs, vital signs, imaging, progress notes, orders, and available advanced directive documents from current and previous encounters. I then met with patient and daughter, Mariah Shines, to discuss diagnosis prognosis, GOC, EOL wishes, disposition and options.  I introduced Palliative Medicine as specialized medical care for people living with serious illness. It focuses on providing relief from the symptoms and stress of a serious illness. The goal is to improve quality of life for both the patient and the family.  Elderly female sitting upright in bed with mirror putting on make-up and combing her hair with daughter at bedside.  She is alert and oriented  to self and situation.  Communication is extremely difficult due to [facility] and communication with writing.  She is calm, pleasant and in no distress.  Patient denies pain or shortness of breath.  She does not have an appetite because she does not like the food in the hospital.  As far as functional and nutritional status, patient's daughter Mariah Shines reports patient is unable to perform ADLs.  She reports there is always a family member that stays with the patient and provides care 24/7.  Mariah Shines shares that nutrition is difficult due to her mother not wanting any of the foods that are provided.  We discussed patient's current illness and what it means in the larger context of patient's on-going co-morbidities.  Natural disease trajectory and expectations at EOL were discussed.  I attempted to elicit values and goals of care important to the patient.  It is important to Ms. Maric to remain in her home.  Advance directives, concepts specific to code status and rehospitalization were considered and discussed. Mariah Shines shares that in previous conversations with her mother, Ms. Goga has insisted that she wants CPR and other interventions if needed.  Mariah Shines adds that she is her mothers H POA and has paperwork at home.  Education offered  regarding concept specific to human mortality and the limitations of medical interventions to prolong life when the body begins to fail to thrive.  Family is facing treatment option decisions, advanced directive, and anticipatory care needs.    Discussed with patient/family the importance of continued conversation with family and the medical providers regarding overall plan of care and treatment options, ensuring decisions are within the context of the patient's values and GOCs.    Questions and concerns were addressed. The family was encouraged to call with questions or concerns.   Primary Decision Maker NEXT OF Kathlyn Parcel, daughter  Physical Exam Vitals reviewed.   Constitutional:      General: She is not in acute distress.    Appearance: Normal appearance. She is not ill-appearing.  Pulmonary:     Effort: Pulmonary effort is normal. No respiratory distress.  Musculoskeletal:     Right lower leg: No edema.     Left lower leg: No edema.  Skin:    General: Skin is warm and dry.  Neurological:     Mental Status: She is alert. Mental status is at baseline.  Psychiatric:        Mood and Affect: Mood normal.        Behavior: Behavior normal.   Recommendations/Plan: FULL CODE status as previously documented    Continue current supportive interventions Plan to d/c home today  Palliative Assessment/Data: 40-50%     Thank you for this consult. Palliative medicine will continue to follow and assist holistically.   Time Total: 75 minutes  Time spent includes: Detailed review of medical records (labs, imaging, vital signs), medically appropriate exam (mental status, respiratory, cardiac, skin), discussed with treatment team, counseling and educating patient, family and staff, documenting clinical information, medication management and coordination of care.     Ina Manas, Joyice Nodal- [facility] Palliative Medicine Team  10/11/2023 1:55 PM  Office [phone]  Pager [phone]     Please contact Palliative Medicine Team providers via AMION for questions and concerns.

## 2023-10-11 NOTE — Progress Notes (Addendum)
SATURATION QUALIFICATIONS: (This note is used to comply with regulatory documentation for home oxygen)  Patient Saturations on Room Air at Rest = 94%  Patient Saturations on Room Air while Ambulating = 86%  Patient Saturations on 2 Liters of oxygen while Ambulating = 92%  Please briefly explain why patient needs home oxygen: 

## 2023-10-11 NOTE — Discharge Summary (Signed)
 Physician Discharge Summary   Patient: Kylie May MRN: [id number] DOB: 1921/09/05  Admit date:     10/09/2023  Discharge date: 10/11/23  Discharge Physician: Kylie May   PCP: Kylie Burkitt, NP   Recommendations at discharge:    F/up with outpt providers as requested  Discharge Diagnoses: Principal Problem:   Pneumonia Active Problems:   Acute hypoxic respiratory failure [facility])  Hospital Course: Assessment and Plan:  89 y.o. female with medical history significant of malnutrition, A-fib on Eliquis , GERD, hypothyroidism is being admitted for possible pneumonia    5/9: PT, OT, palliative care consult   Assessment & Plan:   Principal Problem:   Pneumonia   Community-acquired pneumonia - fever, leukocytosis, cough, sob present on admission - neg Flu, COVID - improved with Rocephin  + Zithro for now. GI Dr Kylie May recommends to continue Difficid due to h/o recurrent c.diff with any Abx - CT chest shows bibasilar pneumonia, trace right pleural effusion - will need 2 liter O2 on ambulation at DC as she desats in mid 80s   Permanent A.fib Continue Eliquis  for Anticoagulation Amiodarone  for rate control   Hypothyroidism Continue levothyroxine    Iron Deficiency Anemia Continue PO iron          Consultants: Palliative Care Disposition: Home Diet recommendation:  Discharge Diet Orders (From admission, onward)     Start     Ordered   10/11/23 0000  Diet - low sodium heart healthy        10/11/23 1136           Carb modified diet DISCHARGE MEDICATION: Allergies as of 10/11/2023       Reactions   Metronidazole     Other reaction(s): Dizziness and giddiness (finding), Other (qualifier value) made things feel like they were vibrating like the bed and floor,also caused mild dizziness weakness   Antihistamines, Loratadine -type Rash   Peeling skin    Penicillin G Rash        Medication List     STOP taking these medications    famotidine  40 MG  tablet Commonly known as: PEPCID    metoprolol  tartrate 25 MG tablet Commonly known as: LOPRESSOR        TAKE these medications    acetaminophen  325 MG tablet Commonly known as: TYLENOL  Take 2 tablets (650 mg total) by mouth every 6 (six) hours as needed for mild pain (or Fever >/= 101).   amiodarone  200 MG tablet Commonly known as: Pacerone  Take 1 tablet (200 mg total) by mouth daily.   amoxicillin -clavulanate 500-125 MG tablet Commonly known as: Augmentin  Take 1 tablet by mouth 2 (two) times daily for 2 days. Start taking on: Oct 12, 2023   apixaban  2.5 MG Tabs tablet Commonly known as: ELIQUIS  Take 1 tablet (2.5 mg total) by mouth 2 (two) times daily.   azelastine  0.1 % nasal spray Commonly known as: ASTELIN  Place 2 sprays into both nostrils 2 (two) times daily. Use in each nostril as directed   CRANBERRY PO Take 2 tablets by mouth daily.   Dificid  200 MG Tabs tablet Generic drug: fidaxomicin  Take 1 tablet (200 mg total) by mouth 2 (two) times daily for 2 days. Start taking on: Oct 12, 2023   ferrous sulfate  325 (65 FE) MG EC tablet Take 325 mg by mouth daily.   fluticasone  50 MCG/ACT nasal spray Commonly known as: FLONASE  Place 2 sprays into both nostrils daily.   levothyroxine  50 MCG tablet Commonly known as: SYNTHROID  Take 1 tablet (50 mcg total) by  mouth daily before breakfast.   magnesium  chloride 64 MG Tbec SR tablet Commonly known as: SLOW-MAG Take 2 tablets (128 mg total) by mouth daily.   montelukast  10 MG tablet Commonly known as: SINGULAIR  TAKE 1 TABLET(10 MG) BY MOUTH AT BEDTIME   nystatin  cream Commonly known as: MYCOSTATIN    omeprazole  40 MG capsule Commonly known as: PRILOSEC TAKE ONE CAPSULE BY MOUTH EVERY MORNING AND 1 AT BEDTIME AS DIRECTED   ondansetron  4 MG tablet Commonly known as: Zofran  Take 1 tablet (4 mg total) by mouth daily as needed for nausea or vomiting.   PROBIOTIC PO Take 1 capsule by mouth 2 (two) times daily.         Follow-up Information     Kylie Burkitt, NP. Schedule an appointment as soon as possible for a visit in 1 week(s).   Specialty: Nurse Practitioner Why: [facility] Discharge F/UP Contact information: [address] Kylie May [address] [place] [address] [phone]         Kylie Fonder, MD. Schedule an appointment as soon as possible for a visit in 2 week(s).   Specialty: Cardiology Why: [facility] Discharge F/UP Contact information: [address] [place] [address] [phone]                Discharge Exam: Kylie May Weights   10/10/23 0100  Weight: 53.1 kg   General exam: Appears calm and comfortable  Respiratory system: Clear to auscultation. Respiratory effort normal. Cardiovascular system: S1 & S2 heard, RRR. No pedal edema. Gastrointestinal system: Abdomen is soft, benign Central nervous system: Alert and oriented. No focal neurological deficits. Extremities: Symmetric 5 x 5 power. Skin: No rashes, lesions or ulcers Psychiatry: Judgement and insight appear normal. Mood & affect appropriate.   Condition at discharge: fair  The results of significant diagnostics from this hospitalization (including imaging, microbiology, ancillary and laboratory) are listed below for reference.   Imaging Studies: CT CHEST WO CONTRAST Result Date: 10/10/2023 CLINICAL DATA:  Respiratory illness EXAM: CT CHEST WITHOUT CONTRAST TECHNIQUE: Multidetector CT imaging of the chest was performed following the standard protocol without IV contrast. RADIATION DOSE REDUCTION: This exam was performed according to the departmental dose-optimization program which includes automated exposure control, adjustment of the mA and/or kV according to patient size and/or use of iterative reconstruction technique. COMPARISON:  Chest x-ray performed Oct 09, 2023 FINDINGS: Cardiovascular: Heart is enlarged. No pericardial effusion. Atherosclerotic changes are present in the  thoracic aorta. No thoracic aortic aneurysm. Mediastinum/Nodes: Mildly prominent lymph nodes are present within the mediastinum which may be reactive. Lungs/Pleura: Airspace consolidation is present in the lower lobes bilaterally. This is small to moderate in size. Trace right pleural effusion. Upper Abdomen: Increased attenuation of the liver diffusely. Motion related changes in the upper abdomen. Hiatal hernia. Musculoskeletal: Right convex thoracic curvature with diffuse osteopenia and multilevel compression deformities within the thoracic spine. A compression deformity at T6 is progressed from the prior examination. IMPRESSION: 1. Bibasilar airspace infiltrate likely sequelae of pneumonia. Small on the right and moderate on the left. 2. Trace right pleural effusion. 3. Enlarged heart. 4. Age-indeterminate compression deformity at T6 which is new when compared to CT scan performed February 20, 2023. 5. Diffuse osteopenia. Electronically Signed   By: Reagan Camera M.D.   On: 10/10/2023 09:40   DG Chest 2 View Result Date: 10/09/2023 CLINICAL DATA:  Cough.  Hypoxia. EXAM: CHEST - 2 VIEW COMPARISON:  04/04/2023. FINDINGS: Low lung volumes. Stable cardiomegaly. Aortic atherosclerosis. Similar  moderate hiatal hernia. Bibasilar opacities, could reflect atelectasis or infiltrate. Scarring at the bilateral costophrenic angles. Scoliotic curvature of the thoracolumbar spine. Diffuse osseous demineralization. IMPRESSION: 1. Low lung volumes with bibasilar opacities, which could reflect atelectasis or infiltrate. 2. Similar moderate hiatal hernia. Electronically Signed   By: Mannie Seek M.D.   On: 10/09/2023 14:37    Microbiology: Results for orders placed or performed during the hospital encounter of 10/09/23  Resp panel by RT-PCR (RSV, Flu A&B, Covid) Anterior Nasal Swab     Status: None   Collection Time: 10/09/23  2:03 PM   Specimen: Anterior Nasal Swab  Result Value Ref Range Status   SARS Coronavirus 2  by RT PCR NEGATIVE NEGATIVE Final    Comment: (NOTE) SARS-CoV-2 target nucleic acids are NOT DETECTED.  The SARS-CoV-2 RNA is generally detectable in upper respiratory specimens during the acute phase of infection. The lowest concentration of SARS-CoV-2 viral copies this assay can detect is 138 copies/mL. A negative result does not preclude SARS-Cov-2 infection and should not be used as the sole basis for treatment or other patient management decisions. A negative result may occur with  improper specimen collection/handling, submission of specimen other than nasopharyngeal swab, presence of viral mutation(s) within the areas targeted by this assay, and inadequate number of viral copies(<138 copies/mL). A negative result must be combined with clinical observations, patient history, and epidemiological information. The expected result is Negative.  Fact Sheet for Patients:  BloggerCourse.com  Fact Sheet for Healthcare Providers:  SeriousBroker.it  This test is no t yet approved or cleared by the [place]  FDA and  has been authorized for detection and/or diagnosis of SARS-CoV-2 by FDA under an Emergency Use Authorization (EUA). This EUA will remain  in effect (meaning this test can be used) for the duration of the COVID-19 declaration under Section 564(b)(1) of the Act, 21 U.S.C.section 360bbb-3(b)(1), unless the authorization is terminated  or revoked sooner.       Influenza A by PCR NEGATIVE NEGATIVE Final   Influenza B by PCR NEGATIVE NEGATIVE Final    Comment: (NOTE) The Xpert Xpress SARS-CoV-2/FLU/RSV plus assay is intended as an aid in the diagnosis of influenza from Nasopharyngeal swab specimens and should not be used as a sole basis for treatment. Nasal washings and aspirates are unacceptable for Xpert Xpress SARS-CoV-2/FLU/RSV testing.  Fact Sheet for Patients: BloggerCourse.com  Fact  Sheet for Healthcare Providers: SeriousBroker.it  This test is not yet approved or cleared by the [place]  FDA and has been authorized for detection and/or diagnosis of SARS-CoV-2 by FDA under an Emergency Use Authorization (EUA). This EUA will remain in effect (meaning this test can be used) for the duration of the COVID-19 declaration under Section 564(b)(1) of the Act, 21 U.S.C. section 360bbb-3(b)(1), unless the authorization is terminated or revoked.     Resp Syncytial Virus by PCR NEGATIVE NEGATIVE Final    Comment: (NOTE) Fact Sheet for Patients: BloggerCourse.com  Fact Sheet for Healthcare Providers: SeriousBroker.it  This test is not yet approved or cleared by the [place]  FDA and has been authorized for detection and/or diagnosis of SARS-CoV-2 by FDA under an Emergency Use Authorization (EUA). This EUA will remain in effect (meaning this test can be used) for the duration of the COVID-19 declaration under Section 564(b)(1) of the Act, 21 U.S.C. section 360bbb-3(b)(1), unless the authorization is terminated or revoked.  Performed at [facility], [address], [place], [place] [address]   Culture, blood (routine x 2)  Call MD if unable to obtain prior to antibiotics being given     Status: None (Preliminary result)   Collection Time: 10/09/23 11:42 PM   Specimen: BLOOD  Result Value Ref Range Status   Specimen Description BLOOD RAC  Final   Special Requests   Final    BOTTLES DRAWN AEROBIC AND ANAEROBIC Blood Culture results may not be optimal due to an inadequate volume of blood received in culture bottles   Culture   Final    NO GROWTH 1 DAY Performed at [facility], [address], [place], [place] [address]    Report Status PENDING  Incomplete  Expectorated Sputum Assessment w Gram Stain, Rflx to Resp Cult     Status: None   Collection Time: 10/10/23 10:27  PM   Specimen: Expectorated Sputum  Result Value Ref Range Status   Specimen Description EXPECTORATED SPUTUM  Final   Special Requests NONE  Final   Sputum evaluation   Final    THIS SPECIMEN IS ACCEPTABLE FOR SPUTUM CULTURE Performed at [facility], [address] [place], [place] [address]    Report Status 10/11/2023 FINAL  Final  Culture, Respiratory w Gram Stain     Status: None (Preliminary result)   Collection Time: 10/10/23 10:27 PM  Result Value Ref Range Status   Specimen Description   Final    EXPECTORATED SPUTUM Performed at [facility], [address], [place], [place] [address]    Special Requests   Final    NONE Reflexed from [id number] Performed at [facility], [address] [place], [place] [address]    Gram Stain   Final    MODERATE WBC SEEN FEW GRAM POSITIVE COCCI RARE GRAM NEGATIVE RODS Performed at [facility], [address], [place], [place] [address]    Culture PENDING  Incomplete   Report Status PENDING  Incomplete    Labs: CBC: Recent Labs  Lab 10/09/23 1403 10/10/23 0713 10/11/23 0338  WBC 17.4* 12.6* 8.5  HGB 12.8 12.0 11.8*  HCT 38.9 36.5 34.4*  MCV 94.2 93.1 92.7  PLT 251 228 234   Basic Metabolic Panel: Recent Labs  Lab 10/09/23 1403 10/10/23 0713 10/11/23 0338  NA 131* 131* 133*  K 4.1 4.2 4.4  CL 97* 100 100  CO2 26 24 25   GLUCOSE 105* 95 91  BUN 14 19 17   CREATININE 0.91 0.89 0.80  CALCIUM  8.6* 8.7* 8.5*   Liver Function Tests: No results for input(s): "AST", "ALT", "ALKPHOS", "BILITOT", "PROT", "ALBUMIN" in the last 168 hours. CBG: No results for input(s): "GLUCAP" in the last 168 hours.  Discharge time spent: greater than 30 minutes.  Signed: Brenna Cam, MD [facility] 10/11/2023

## 2023-10-13 ENCOUNTER — Telehealth: Payer: Self-pay

## 2023-10-13 LAB — CULTURE, RESPIRATORY W GRAM STAIN: Culture: NORMAL

## 2023-10-13 LAB — LEGIONELLA PNEUMOPHILA SEROGP 1 UR AG: L. pneumophila Serogp 1 Ur Ag: NEGATIVE

## 2023-10-13 NOTE — Transitions of Care (Post Inpatient/ED Visit) (Signed)
 10/13/2023  Name: Kylie May MRN: [id number] DOB: February 17, 1922  Today's TOC FU Call Status: Today's TOC FU Call Status:: Successful TOC FU Call Completed TOC FU Call Complete Date: 10/13/23 Patient's Name and Date of Birth confirmed.  Transition Care Management Follow-up Telephone Call Date of Discharge: 10/13/23 Discharge Facility: [facility]) Type of Discharge: Inpatient Admission Primary Inpatient Discharge Diagnosis:: Pneumonia How have you been since you were released from the hospital?: Better Any questions or concerns?: No  Items Reviewed: Did you receive and understand the discharge instructions provided?: Yes Medications obtained,verified, and reconciled?: Yes (Medications Reviewed) Any new allergies since your discharge?: No Dietary orders reviewed?: Yes Type of Diet Ordered:: Low sodium Heart Healthy Do you have support at home?: Yes People in Home [RPT]: child(ren), adult Name of Support/Comfort Primary Source: Alethia Huxley  Medications Reviewed Today: Medications Reviewed Today     Reviewed by Claudene Crystal, RN (Case Manager) on 10/13/23 at 1531  Med List Status: <None>   Medication Order Taking? Sig Documenting Provider Last Dose Status Informant  acetaminophen  (TYLENOL ) 325 MG tablet [id number]  Take 2 tablets (650 mg total) by mouth every 6 (six) hours as needed for mild pain (or Fever >/= 101). Verla Glaze, MD  Active Child           Med Note Thedore Finn, RAWAN A   Thu Oct 09, 2023  6:26 PM) prn  amiodarone  (PACERONE ) 200 MG tablet [id number]  Take 1 tablet (200 mg total) by mouth daily. Kent Pear, MD  Active   amoxicillin -clavulanate (AUGMENTIN ) 500-125 MG tablet [id number]  Take 1 tablet by mouth 2 (two) times daily for 2 days. Brenna Cam, MD  Active   apixaban  (ELIQUIS ) 2.5 MG TABS tablet [id number]  Take 1 tablet (2.5 mg total) by mouth 2 (two) times daily. Gollan, Timothy J, MD  Active Child  azelastine   (ASTELIN ) 0.1 % nasal spray [id number]  Place 2 sprays into both nostrils 2 (two) times daily. Use in each nostril as directed Kent Pear, MD  Active Child           Med Note Thedore Finn, RAWAN A   Thu Oct 09, 2023  6:28 PM) prn  CRANBERRY PO [id number]  Take 2 tablets by mouth daily. [provider]  Active Child  ferrous sulfate  325 (65 FE) MG EC tablet [id number]  Take 325 mg by mouth daily. [provider]  Active Child           Med Note Jimmy Moulding   Tue Mar 11, 2023  1:09 AM) Takes nightly  fidaxomicin  (DIFICID ) 200 MG TABS tablet [id number]  Take 1 tablet (200 mg total) by mouth 2 (two) times daily for 2 days. Brenna Cam, MD  Active   fluticasone  (FLONASE ) 50 MCG/ACT nasal spray [id number]  Place 2 sprays into both nostrils daily. Kent Pear, MD  Active            Med Note Thedore Finn, RAWAN A   Thu Oct 09, 2023  6:29 PM) prn  levothyroxine  (SYNTHROID ) 50 MCG tablet [id number]  Take 1 tablet (50 mcg total) by mouth daily before breakfast. Bluford Burkitt, NP  Active   magnesium  chloride (SLOW-MAG) 64 MG TBEC SR tablet [id number]  Take 2 tablets (128 mg total) by mouth daily. Kent Pear, MD  Active Child  montelukast  (SINGULAIR ) 10 MG tablet [id number]  TAKE 1 TABLET(10 MG) BY MOUTH AT BEDTIME Kent Pear, MD  Active   nystatin   cream (MYCOSTATIN ) [id number]   [provider]  Active Child           Med Note Mena Stai   Thu Oct 09, 2023  6:33 PM) prn  omeprazole  (PRILOSEC) 40 MG capsule [id number]  TAKE ONE CAPSULE BY MOUTH EVERY MORNING AND 1 AT BEDTIME AS DIRECTED Kent Pear, MD  Active   ondansetron  (ZOFRAN ) 4 MG tablet [id number]  Take 1 tablet (4 mg total) by mouth daily as needed for nausea or vomiting. Wouk, Haynes Lips, MD  Active            Med Note Thedore Finn, RAWAN A   Thu Oct 09, 2023  6:33 PM) prn  Probiotic Product (PROBIOTIC PO) [id number]  Take 1 capsule by mouth 2 (two) times daily. [provider]  Active Child             Home Care and Equipment/Supplies: Were Home Health Services Ordered?: No Any new equipment or medical supplies ordered?: No  Functional Questionnaire: Do you need assistance with bathing/showering or dressing?: Yes Do you need assistance with meal preparation?: Yes Do you need assistance with eating?: No Do you have difficulty maintaining continence: Yes Do you need assistance with getting out of bed/getting out of a chair/moving?: No Do you have difficulty managing or taking your medications?: Yes  Follow up appointments reviewed: PCP Follow-up appointment confirmed?: Yes Date of PCP follow-up appointment?: 10/17/23 Follow-up Provider: Bluford Burkitt Specialist [facility] Follow-up appointment confirmed?: Yes Date of Specialist follow-up appointment?: 10/17/23 Follow-Up Specialty Provider:: Dr. Verdie Gladden Do you need transportation to your follow-up appointment?: No Do you understand care options if your condition(s) worsen?: Yes-patient verbalized understanding  SDOH Interventions Today    Flowsheet Row Most Recent Value  SDOH Interventions   Food Insecurity Interventions Intervention Not Indicated  Housing Interventions Intervention Not Indicated  Transportation Interventions Intervention Not Indicated  Utilities Interventions Intervention Not Indicated       Gareld June, BSN, RN [place]  VBCI - [facility] RN Care Manager [phone]

## 2023-10-13 NOTE — Telephone Encounter (Signed)
 Erronous encounter

## 2023-10-15 LAB — CULTURE, BLOOD (ROUTINE X 2): Culture: NO GROWTH

## 2023-10-15 NOTE — Progress Notes (Unsigned)
 Cardiology Office Note    Date:  10/17/2023   ID:  Kylie May, DOB 1922/01/02, MRN [id number]  PCP:  Bluford Burkitt, NP  Cardiologist:  Belva Boyden, MD  Electrophysiologist:  None   Chief Complaint: Hospital follow up  History of Present Illness:   Kylie May is a 89 y.o. female with history of paroxysmal atrial fibrillation on reduced dose Eliquis  (age and weight), hypertension, hiatal hernia, osteopenia, degenerative joint disease, and scoliosis who is being seen for hospital follow up.     Patient was seen in urgent care early 2016 with worsening fatigue and lower extremity edema.  She was found to be in A-fib with RVR and transferred to [facility] for further evaluation.  She was initially treated with IV Cardizem , however BP became soft and she was transitioned to IV amiodarone .  She did convert to normal sinus rhythm and was continued on oral amiodarone .  She was placed on Eliquis  2.5 mg twice daily (age and weight).  Echo at that time showed normal LV function, severely dilated left atrium, severe MR, and moderate to severe TR.  TSH was significantly elevated follow-up and patient was subsequently transition from amiodarone  to flecainide .  She was hospitalized in 08/2018 for sepsis secondary to cholecystitis/choledocholithiasis.  This hospitalization was complicated by A-fib with RVR, acute on chronic diastolic heart failure, and hypotension.  Echo showed EF 60 to 65%.  She was restarted on her home dose of flecainide  which was not successful in controlling her rates.  She was started on IV amiodarone  with successful rate control and transitioned to oral amiodarone .  She converted to sinus rhythm and was discharged on oral amiodarone .   Patient was most recently seen by Dr. Gollan 08/02/2022 and was overall doing well from a cardiac perspective.  Family and patient made joint decision to discontinue statin.  She was continued on amiodarone  200 mg daily, Eliquis  2.5 mg twice daily,  and metoprolol  tartrate 25 mg twice daily.  Patient presented to [facility] 10/09/2023 with weakness and shortness of breath.  She was found to have community-acquired pneumonia and was treated with antibiotics. During hospitalization she was noted to have atrial fibrillation. Independently reviewed EKGs and telemetry strips which show sinus rhythm with PACs and no evidence of atrial fibrillation. BP was soft and metoprolol  was subsequently held during admission and not restarted on discharge.   Patient presents with her daughter and grandson who assists with communication since patient is hard of hearing.  They also assist in history.  Today patient is doing well from a cardiac perspective without symptoms of angina or cardiac decompensation.  She denies chest pain, shortness of breath, palpitations, and orthopnea.  Prior to hospitalization she was doing well without symptoms of tachypalpitations consistent with A-fib.  Family tells me that she has not been taking metoprolol  since her hospitalization 03/2023.  Her family does report that intermittently her lower extremities become swollen and they have difficulty getting the swelling down with elevation and compression stockings.  Her family provides her a lot of assistance at home.  They deny any recent falls.  She denies any bleeding and hematochezia.  She reports doing well since hospital discharge with some residual fatigue.  Labs independently reviewed: 10/09/2023-NA 131, K4.1, CL 97, BUN 14, CR 0.91, Hgb 12.8, HCT 38.9, troponin negative, TSH and T4 within normal limits, BNP 224  Past Medical History:  Diagnosis Date   A-fib (HCC)    Acid reflux disease    Arrhythmia  Atrial fibrillation with RVR (HCC) 08/15/2018   Chronic diastolic CHF (congestive heart failure) (HCC)    Compression fracture of lumbar vertebra (HCC)    Diverticulitis 01-2008   [facility] [place]   Diverticulosis    Esophageal stricture 2006   Femoral bruit    Right   Fibrocystic  breast disease    Hiatal hernia    History of esophageal stricture 06/14/2004   2006 esophageal dilation Schatzki's ring    History of skin cancer 12/29/2008   Basal cell cancer right glabella 07/10/2010 Dr. Tresa Frohlich, [facility] [place] , [place]    Hypercholesterolemia    [place] study LDL goal = < 160   Mitral regurgitation    a. 06/2014 EF 55-60%, elevated end-diastolic pressures, dilated LA at 4.3 cm, mildly dilated RA, severe mitral regurgitation, mild aortic sclerosis without stenosis, mod-severe TR   Mitral valve prolapse    Osteoporosis    Dr Steve El   Pancreatitis 07-2008   Hospitalized    Patella fracture 05/13/2018   Schatzki's ring    Sepsis (HCC) 08/15/2018   Skin cancer    facial x 2. Dr Tresa Frohlich    Past Surgical History:  Procedure Laterality Date   BILIARY STENT PLACEMENT  08/21/2018   Procedure: BILIARY STENT PLACEMENT;  Surgeon: Genell Ken, MD;  Location: [facility] ENDOSCOPY;  Service: Gastroenterology;;   COLONOSCOPY  2006   Epidural steroids     x1   ERCP N/A 08/21/2018   Procedure: ENDOSCOPIC RETROGRADE CHOLANGIOPANCREATOGRAPHY (ERCP);  Surgeon: Genell Ken, MD;  Location: [facility] ENDOSCOPY;  Service: Gastroenterology;  Laterality: N/A;   ESOPHAGEAL DILATION  2006   ESOPHAGOGASTRODUODENOSCOPY (EGD) WITH PROPOFOL  N/A 11/16/2018   Procedure: ESOPHAGOGASTRODUODENOSCOPY (EGD) WITH PROPOFOL ;  Surgeon: Selena Daily, MD;  Location: [facility] ENDOSCOPY;  Service: Gastroenterology;  Laterality: N/A;   G3 P4 (twins)     IR CHOLANGIOGRAM EXISTING TUBE  08/18/2018   IR CHOLANGIOGRAM EXISTING TUBE  07/16/2022   IR CHOLANGIOGRAM EXISTING TUBE  04/28/2023   IR EXCHANGE BILIARY DRAIN  11/03/2018   IR EXCHANGE BILIARY DRAIN  12/01/2018   IR EXCHANGE BILIARY DRAIN  05/13/2019   IR EXCHANGE BILIARY DRAIN  06/14/2019   IR EXCHANGE BILIARY DRAIN  07/19/2019   IR EXCHANGE BILIARY DRAIN  08/17/2019   IR EXCHANGE BILIARY DRAIN  09/30/2019   IR EXCHANGE BILIARY DRAIN  12/08/2019   IR EXCHANGE BILIARY DRAIN   01/19/2020   IR EXCHANGE BILIARY DRAIN  03/01/2020   IR EXCHANGE BILIARY DRAIN  04/20/2020   IR EXCHANGE BILIARY DRAIN  07/06/2020   IR EXCHANGE BILIARY DRAIN  10/26/2020   IR EXCHANGE BILIARY DRAIN  01/18/2021   IR EXCHANGE BILIARY DRAIN  03/05/2021   IR EXCHANGE BILIARY DRAIN  05/25/2021   IR EXCHANGE BILIARY DRAIN  08/03/2021   IR EXCHANGE BILIARY DRAIN  09/28/2021   IR EXCHANGE BILIARY DRAIN  11/23/2021   IR EXCHANGE BILIARY DRAIN  12/27/2021   IR EXCHANGE BILIARY DRAIN  02/20/2022   IR EXCHANGE BILIARY DRAIN  04/17/2022   IR EXCHANGE BILIARY DRAIN  06/17/2022   IR EXCHANGE BILIARY DRAIN  07/12/2022   IR EXCHANGE BILIARY DRAIN  08/12/2022   IR EXCHANGE BILIARY DRAIN  09/20/2022   IR EXCHANGE BILIARY DRAIN  11/01/2022   IR EXCHANGE BILIARY DRAIN  12/31/2022   IR EXCHANGE BILIARY DRAIN  02/25/2023   IR EXCHANGE BILIARY DRAIN  03/12/2023   IR EXCHANGE BILIARY DRAIN  03/25/2023   IR EXCHANGE BILIARY DRAIN  05/12/2023   IR EXCHANGE BILIARY  DRAIN  06/20/2023   IR PERC CHOLECYSTOSTOMY  08/16/2018   IR RADIOLOGIST EVAL & MGMT  11/08/2021   IR RADIOLOGIST EVAL & MGMT  01/10/2023   REMOVAL OF STONES  08/21/2018   Procedure: REMOVAL OF STONES;  Surgeon: Genell Ken, MD;  Location: [facility] ENDOSCOPY;  Service: Gastroenterology;;   SKIN CANCER EXCISION     SPHINCTEROTOMY  08/21/2018   Procedure: SPHINCTEROTOMY;  Surgeon: Genell Ken, MD;  Location: [facility] ENDOSCOPY;  Service: Gastroenterology;;    Current Medications: Current Meds  Medication Sig   acetaminophen  (TYLENOL ) 325 MG tablet Take 2 tablets (650 mg total) by mouth every 6 (six) hours as needed for mild pain (or Fever >/= 101).   azelastine  (ASTELIN ) 0.1 % nasal spray Place 2 sprays into both nostrils 2 (two) times daily. Use in each nostril as directed   CRANBERRY PO Take 2 tablets by mouth daily.   famotidine  (PEPCID ) 40 MG tablet Take 20 mg by mouth daily.   ferrous sulfate  325 (65 FE) MG EC tablet Take 325 mg by mouth daily.   Fidaxomicin  (DIFICID  PO) Take by  mouth. On hand, per GI but not taking at the moment as of 10/17/2023   fluticasone  (FLONASE ) 50 MCG/ACT nasal spray Place 2 sprays into both nostrils daily.   furosemide  (LASIX ) 20 MG tablet Take 1 tablet (20 mg total) by mouth daily as needed (for wt gain of 3 lb in a day or 5 lb in a week).   levothyroxine  (SYNTHROID ) 50 MCG tablet Take 1 tablet (50 mcg total) by mouth daily before breakfast.   magnesium  chloride (SLOW-MAG) 64 MG TBEC SR tablet Take 2 tablets (128 mg total) by mouth daily.   montelukast  (SINGULAIR ) 10 MG tablet TAKE 1 TABLET(10 MG) BY MOUTH AT BEDTIME   nystatin  cream (MYCOSTATIN )    omeprazole  (PRILOSEC) 40 MG capsule TAKE ONE CAPSULE BY MOUTH EVERY MORNING AND 1 AT BEDTIME AS DIRECTED   ondansetron  (ZOFRAN ) 4 MG tablet Take 1 tablet (4 mg total) by mouth daily as needed for nausea or vomiting.   OXYGEN Inhale 2 L/min into the lungs as needed.   Probiotic Product (PROBIOTIC PO) Take 1 capsule by mouth 2 (two) times daily.   [DISCONTINUED] amiodarone  (PACERONE ) 200 MG tablet Take 1 tablet (200 mg total) by mouth daily.   [DISCONTINUED] apixaban  (ELIQUIS ) 2.5 MG TABS tablet Take 1 tablet (2.5 mg total) by mouth 2 (two) times daily.    Allergies:   Metronidazole ; Antihistamines, loratadine -type; and Penicillin g   Social History   Socioeconomic History   Marital status: Widowed    Spouse name: Not on file   Number of children: 3   Years of education: Not on file   Highest education level: Bachelor's degree (e.g., BA, AB, BS)  Occupational History   Occupation: Manufacturing engineer --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  Tobacco Use   Smoking status: Former    Current packs/day: 0.00    Types: Cigarettes    Quit date: 06/04/1951    Years since quitting: 72.4    Passive exposure: Past   Smokeless tobacco: Never   Tobacco comments:    Smoked 1945-1953 , up to 2 cigarettes/day  Vaping Use   Vaping status: Never Used  Substance and Sexual Activity    Alcohol use: Not Currently    Alcohol/week: 1.0 standard drink of alcohol    Types: 1 Glasses of wine per week    Comment: occassional   Drug use: No  Sexual activity: Not Currently  Other Topics Concern   Not on file  Social History Narrative   Has living will    Would want daughter Cantrell Larouche Shines to be health care POA   Not sure about DNR--definitely doesn't want prolonged life support   Not sure about tube feeds either (but probably not)         Social Drivers of Health   Financial Resource Strain: Low Risk  (03/24/2023)   Overall Financial Resource Strain (CARDIA)    Difficulty of Paying Living Expenses: Not hard at all  Food Insecurity: No Food Insecurity (10/13/2023)   Hunger Vital Sign    Worried About Running Out of Food in the Last Year: Never true    Ran Out of Food in the Last Year: Never true  Transportation Needs: No Transportation Needs (10/13/2023)   PRAPARE - Administrator, Civil Service (Medical): No    Lack of Transportation (Non-Medical): No  Physical Activity: Unknown (03/24/2023)   Exercise Vital Sign    Days of Exercise per Week: Patient declined    Minutes of Exercise per Session: Not on file  Stress: No Stress Concern Present (03/24/2023)   [employer] of Occupational Health - Occupational Stress Questionnaire    Feeling of Stress : Only a little  Social Connections: Unknown (10/10/2023)   Social Connection and Isolation Panel [NHANES]    Frequency of Communication with Friends and Family: More than three times a week    Frequency of Social Gatherings with Friends and Family: More than three times a week    Attends Religious Services: Patient unable to answer    Active Member of Clubs or Organizations: Patient unable to answer    Attends Banker Meetings: Patient unable to answer    Marital Status: Widowed     Family History:  The patient's family history includes Breast cancer in her maternal aunt; Heart attack (age of  onset: 33) in her daughter; Heart attack (age of onset: 83) in her sister; Hepatitis in her daughter; Liver cancer in her daughter; Lung disease in her mother. There is no history of Diabetes or Stroke.  ROS:   12-point review of systems is negative unless otherwise noted in the HPI.   EKGs/Labs/Other Studies Reviewed:    Studies reviewed were summarized above. The additional studies were reviewed today:  08/17/2018 Echo complete 1. The left ventricle has normal systolic function, with an ejection  fraction of 60-65%. The cavity size was normal. Left ventricular diastolic  function could not be evaluated secondary to atrial fibrillation. No  evidence of left ventricular regional  wall motion abnormalities.   2. The right ventricle has normal systolc function. The cavity was  normal. There is no increase in right ventricular wall thickness. Right  ventricular systolic pressure is mildly elevated with an estimated  pressure of 43.5 mmHg.   3. Left atrial size was moderately dilated.   4. Right atrial size was moderately dilated.   5. Small pericardial effusion.   6. The pericardial effusion is circumferential.   7. The aortic valve is tricuspid Mild thickening of the aortic valve Mild  calcification of the aortic valve.   8. The inferior vena cava was dilated in size with <50% respiratory  variability.   EKG:  EKG is ordered today.  The EKG ordered today demonstrates sinus rhythm  Recent Labs: 03/11/2023: Magnesium  1.7 07/16/2023: ALT 17 10/09/2023: B Natriuretic Peptide 224.7; TSH 2.200 10/11/2023: BUN 17; Creatinine, Ser 0.80; Hemoglobin  11.8; Platelets 234; Potassium 4.4; Sodium 133  Recent Lipid Panel    Component Value Date/Time   CHOL 208 (H) 07/01/2022 1022   CHOL 102 06/12/2021 1215   TRIG 135.0 07/01/2022 1022   HDL 57.40 07/01/2022 1022   HDL 50 06/12/2021 1215   CHOLHDL 4 07/01/2022 1022   VLDL 27.0 07/01/2022 1022   LDLCALC 124 (H) 07/01/2022 1022   LDLCALC 35  06/12/2021 1215   LDLCALC 47 09/17/2019 1515   LDLDIRECT 121.4 06/17/2011 1146    PHYSICAL EXAM:    VS:  BP 126/66   Pulse 69   Ht 4\' 11"  (1.499 m)   Wt 96 lb (43.5 kg)   SpO2 94%   BMI 19.39 kg/m   BMI: Body mass index is 19.39 kg/m.  Physical Exam Vitals and nursing note reviewed.  Constitutional:      General: She is not in acute distress.    Appearance: Normal appearance.  Cardiovascular:     Rate and Rhythm: Normal rate and regular rhythm.     Heart sounds: No murmur heard. Pulmonary:     Effort: Pulmonary effort is normal. No respiratory distress.     Breath sounds: No wheezing.     Comments: Faint crackles Musculoskeletal:     Right lower leg: No edema.     Left lower leg: No edema.  Skin:    General: Skin is warm and dry.     Comments: LE venous stasis noted  Neurological:     General: No focal deficit present.     Mental Status: She is alert and oriented to person, place, and time. Mental status is at baseline.  Psychiatric:        Mood and Affect: Mood normal.        Behavior: Behavior normal.     Wt Readings from Last 3 Encounters:  10/17/23 96 lb (43.5 kg)  10/13/23 117 lb (53.1 kg)  10/10/23 117 lb 1 oz (53.1 kg)     ASSESSMENT & PLAN:   Paroxysmal atrial fibrillation - Denies symptoms concerning for atrial fibrillation. Metoprolol  has been held for several months now secondary to soft blood pressure. Agree with discontinuing this. EKG/telemetry strips reviewed from recent hospitalization which show sinus rhythm and no evidence of afib. Maintaining sinus rhythm today. Denies recent falls, bleeding, and hematochezia. Continue amiodarone  200 mg daily and Eliquis  2.5 mg twice daily (age and weight).  Chronic diastolic heart failure - Appears euvolemic on exam today. Given concern for lower extremity edema intermittently, will prescribe furosemide  20 mg as needed for weight gain of greater than 3 pounds in 1 day or greater than 5 pounds in 1  week.  Abdominal aortic atherosclerosis Pure hypercholesterolemia - No further workup given age. Prior shared decision to discontinue statin due to advanced age.    Disposition: F/u with Dr. Gollan or an APP in 1 year.   Medication Adjustments/Labs and Tests Ordered: Current medicines are reviewed at length with the patient today.  Concerns regarding medicines are outlined above. Medication changes, Labs and Tests ordered today are summarized above and listed in the Patient Instructions accessible in Encounters.   Beather Liming, PA-C 10/17/2023 2:39 PM     [facility] [place] [address] [place], [place] [address] [phone]

## 2023-10-16 ENCOUNTER — Other Ambulatory Visit: Payer: Self-pay | Admitting: Cardiovascular Disease

## 2023-10-17 ENCOUNTER — Ambulatory Visit: Attending: Physician Assistant | Admitting: Physician Assistant

## 2023-10-17 ENCOUNTER — Encounter: Payer: Self-pay | Admitting: Physician Assistant

## 2023-10-17 VITALS — BP 126/66 | HR 69 | Ht 59.0 in | Wt 96.0 lb

## 2023-10-17 DIAGNOSIS — I480 Paroxysmal atrial fibrillation: Secondary | ICD-10-CM | POA: Diagnosis not present

## 2023-10-17 DIAGNOSIS — E7800 Pure hypercholesterolemia, unspecified: Secondary | ICD-10-CM | POA: Diagnosis not present

## 2023-10-17 DIAGNOSIS — I5032 Chronic diastolic (congestive) heart failure: Secondary | ICD-10-CM

## 2023-10-17 DIAGNOSIS — I700 Atherosclerosis of aorta: Secondary | ICD-10-CM

## 2023-10-17 MED ORDER — APIXABAN 2.5 MG PO TABS: 2.5000 mg | ORAL_TABLET | Freq: Two times a day (BID) | ORAL | 3 refills | Status: DC

## 2023-10-17 MED ORDER — AMIODARONE HCL 200 MG PO TABS: 200.0000 mg | ORAL_TABLET | Freq: Every day | ORAL | 3 refills | Status: AC

## 2023-10-17 MED ORDER — FUROSEMIDE 20 MG PO TABS: 20.0000 mg | ORAL_TABLET | Freq: Every day | ORAL | 3 refills | Status: AC | PRN

## 2023-10-17 NOTE — Patient Instructions (Signed)
 Medication Instructions:  Your physician recommends the following medication changes.  START TAKING: Lasix  20mg  daily as needed for wt gain of 3 lb in a day or 5 lb in a week  *If you need a refill on your cardiac medications before your next appointment, please call your pharmacy*  Lab Work: No labs ordered today  If you have labs (blood work) drawn today and your tests are completely normal, you will receive your results only by: MyChart Message (if you have MyChart) OR A paper copy in the mail If you have any lab test that is abnormal or we need to change your treatment, we will call you to review the results.  Testing/Procedures: No test ordered today   Follow-Up: At [facility], you and your health needs are our priority.  As part of our continuing mission to provide you with exceptional heart care, our providers are all part of one team.  This team includes your primary Cardiologist (physician) and Advanced Practice Providers or APPs (Physician Assistants and Nurse Practitioners) who all work together to provide you with the care you need, when you need it.  Your next appointment:   12 month(s)  Provider:   Timothy Gollan, MD, Gildardo Labrador, PA-C, or Varney Gentleman, PA-C

## 2023-10-21 ENCOUNTER — Ambulatory Visit: Admitting: Nurse Practitioner

## 2023-10-21 VITALS — BP 98/62 | HR 76 | Temp 97.7°F | Ht 59.0 in | Wt 94.8 lb

## 2023-10-21 DIAGNOSIS — J189 Pneumonia, unspecified organism: Secondary | ICD-10-CM | POA: Diagnosis not present

## 2023-10-21 DIAGNOSIS — R634 Abnormal weight loss: Secondary | ICD-10-CM | POA: Diagnosis not present

## 2023-10-21 DIAGNOSIS — Z09 Encounter for follow-up examination after completed treatment for conditions other than malignant neoplasm: Secondary | ICD-10-CM

## 2023-10-21 MED ORDER — BENZONATATE 100 MG PO CAPS: 100.0000 mg | ORAL_CAPSULE | Freq: Three times a day (TID) | ORAL | 0 refills | Status: DC | PRN

## 2023-10-21 NOTE — Progress Notes (Signed)
 Bluford Burkitt, NP-C Phone: [phone]  Kylie May is a 89 y.o. female who presents today for hospital follow up.  Discussed the use of AI scribe software for clinical note transcription with the patient, who gave verbal consent to proceed.  History of Present Illness   Kylie May is a 89 year old female who presents with persistent cough and weight loss. She is accompanied by her daughter and grandson who provided majority of the history. Communicated with the patient by text on phone given difficulty hearing.   She was recently hospitalized for two days with bacterial pneumonia, confirmed by a CT of the chest. During her hospital stay, she was treated with antibiotics, including Rocephin  and Zithromax , and was discharged with a continuation of antibiotics for two days. She also took Dificid  as per her previous GI consultation.  Since discharge, she has experienced a persistent cough, described as 'horrible,' with frequent coughing and mucus expectoration. She feels tired and weak from the coughing but has no shortness of breath. Her family reports that her oxygen levels at rest have ranged from 94-99%. She has completed her course of antibiotics.  Her appetite has been poor, and she has experienced significant weight loss, dropping from 117 pounds at hospital admission to 94 pounds currently. Her daughter notes that she is a picky eater and that the family tries to limit her fat intake since her feeding tube was removed. The family is concerned about her weight loss, especially given her sister's recent passing under similar circumstances.  She has a history of cardiology follow-up, with the last visit occurring after her hospital discharge, and no new concerns were noted. Her family is attentive to her dietary needs and is trying to encourage better eating habits to address her weight loss.      Social History   Tobacco Use  Smoking Status Former   Current packs/day: 0.00    Types: Cigarettes   Quit date: 06/04/1951   Years since quitting: 72.4   Passive exposure: Past  Smokeless Tobacco Never  Tobacco Comments   Smoked 1945-1953 , up to 2 cigarettes/day    Current Outpatient Medications on File Prior to Visit  Medication Sig Dispense Refill   acetaminophen  (TYLENOL ) 325 MG tablet Take 2 tablets (650 mg total) by mouth every 6 (six) hours as needed for mild pain (or Fever >/= 101).     amiodarone  (PACERONE ) 200 MG tablet Take 1 tablet (200 mg total) by mouth daily. 90 tablet 3   apixaban  (ELIQUIS ) 2.5 MG TABS tablet TAKE 1 TABLET TWICE A DAY 180 tablet 0   azelastine  (ASTELIN ) 0.1 % nasal spray Place 2 sprays into both nostrils 2 (two) times daily. Use in each nostril as directed 30 mL 12   CRANBERRY PO Take 2 tablets by mouth daily.     famotidine  (PEPCID ) 40 MG tablet Take 20 mg by mouth daily.     ferrous sulfate  325 (65 FE) MG EC tablet Take 325 mg by mouth daily.     Fidaxomicin  (DIFICID  PO) Take by mouth. On hand, per GI but not taking at the moment as of 10/17/2023     fluticasone  (FLONASE ) 50 MCG/ACT nasal spray Place 2 sprays into both nostrils daily. 16 g 1   furosemide  (LASIX ) 20 MG tablet Take 1 tablet (20 mg total) by mouth daily as needed (for wt gain of 3 lb in a day or 5 lb in a week). 90 tablet 3   levothyroxine  (SYNTHROID ) 50 MCG  tablet Take 1 tablet (50 mcg total) by mouth daily before breakfast. 90 tablet 3   magnesium  chloride (SLOW-MAG) 64 MG TBEC SR tablet Take 2 tablets (128 mg total) by mouth daily. 60 tablet 3   montelukast  (SINGULAIR ) 10 MG tablet TAKE 1 TABLET(10 MG) BY MOUTH AT BEDTIME 30 tablet 3   nystatin  cream (MYCOSTATIN )      omeprazole  (PRILOSEC) 40 MG capsule TAKE ONE CAPSULE BY MOUTH EVERY MORNING AND 1 AT BEDTIME AS DIRECTED 180 capsule 1   ondansetron  (ZOFRAN ) 4 MG tablet Take 1 tablet (4 mg total) by mouth daily as needed for nausea or vomiting. 30 tablet 1   OXYGEN Inhale 2 L/min into the lungs as needed.     Probiotic  Product (PROBIOTIC PO) Take 1 capsule by mouth 2 (two) times daily.     No current facility-administered medications on file prior to visit.     ROS see history of present illness  Objective  Physical Exam Vitals:   10/21/23 1118  BP: 98/62  Pulse: 76  Temp: 97.7 F (36.5 C)  SpO2: 99%    BP Readings from Last 3 Encounters:  10/21/23 98/62  10/17/23 126/66  10/11/23 (!) 111/52   Wt Readings from Last 3 Encounters:  10/21/23 94 lb 13.6 oz (43 kg)  10/17/23 96 lb (43.5 kg)  10/13/23 117 lb (53.1 kg)    Physical Exam Constitutional:      General: She is not in acute distress.    Appearance: Normal appearance.  HENT:     Head: Normocephalic.  Cardiovascular:     Rate and Rhythm: Normal rate and regular rhythm.     Heart sounds: Normal heart sounds.  Pulmonary:     Effort: Pulmonary effort is normal.     Breath sounds: Examination of the left-upper field reveals rales. Rales present.  Skin:    General: Skin is warm and dry.  Neurological:     General: No focal deficit present.     Mental Status: She is alert.  Psychiatric:        Mood and Affect: Mood normal.        Behavior: Behavior normal.     Assessment/Plan: Please see individual problem list.  Hospital discharge follow-up Assessment & Plan: Notes, imaging, labs and discharge summary were reviewed. See plan for pneumonia.    Pneumonia due to infectious organism, unspecified laterality, unspecified part of lung Assessment & Plan: She completed Augmentin  with symptom improvement but has a persistent productive cough. Oxygen saturation remains stable. Order a repeat chest CT in 4-6 weeks post-discharge to ensure resolution. Recommend over-the-counter plain Mucinex  for mucus expectoration and prescribe Tessalon  Perles for cough, up to three times daily as needed. Ensure adequate hydration and nutrition. Follow up with a phone call in one week to assess the cough and consider steroids if it  persists.  Orders: -     Benzonatate ; Take 1 capsule (100 mg total) by mouth 3 (three) times daily as needed for cough.  Dispense: 30 capsule; Refill: 0 -     CT CHEST WO CONTRAST; Future  Weight loss, unintentional Assessment & Plan: She experienced significant weight loss from 117 to 94 pounds, likely due to multiple factors, including recent pneumonia, poor appetite and selective eating habits. Encourage increased caloric intake focusing on palatable foods within dietary restrictions. Monitor weight closely for further loss or stabilization. Discuss dietary preferences and strategies to increase intake with her family.      Return if symptoms worsen  or fail to improve, for as scheduled.   Bluford Burkitt, NP-C [facility]

## 2023-10-22 ENCOUNTER — Encounter: Payer: Self-pay | Admitting: Nurse Practitioner

## 2023-10-22 DIAGNOSIS — Z09 Encounter for follow-up examination after completed treatment for conditions other than malignant neoplasm: Secondary | ICD-10-CM | POA: Insufficient documentation

## 2023-10-22 NOTE — Assessment & Plan Note (Signed)
 Notes, imaging, labs and discharge summary were reviewed. See plan for pneumonia.

## 2023-10-22 NOTE — Assessment & Plan Note (Signed)
 She experienced significant weight loss from 117 to 94 pounds, likely due to multiple factors, including recent pneumonia, poor appetite and selective eating habits. Encourage increased caloric intake focusing on palatable foods within dietary restrictions. Monitor weight closely for further loss or stabilization. Discuss dietary preferences and strategies to increase intake with her family.

## 2023-10-22 NOTE — Assessment & Plan Note (Signed)
 She completed Augmentin  with symptom improvement but has a persistent productive cough. Oxygen saturation remains stable. Order a repeat chest CT in 4-6 weeks post-discharge to ensure resolution. Recommend over-the-counter plain Mucinex  for mucus expectoration and prescribe Tessalon  Perles for cough, up to three times daily as needed. Ensure adequate hydration and nutrition. Follow up with a phone call in one week to assess the cough and consider steroids if it persists.

## 2023-10-28 ENCOUNTER — Ambulatory Visit
Admission: RE | Admit: 2023-10-28 | Discharge: 2023-10-28 | Disposition: A | Discharge status: Home / Self Care | Source: Ambulatory Visit | Attending: Nurse Practitioner | Admitting: Nurse Practitioner

## 2023-10-28 DIAGNOSIS — K449 Diaphragmatic hernia without obstruction or gangrene: Secondary | ICD-10-CM | POA: Diagnosis not present

## 2023-10-28 DIAGNOSIS — J189 Pneumonia, unspecified organism: Secondary | ICD-10-CM | POA: Diagnosis not present

## 2023-10-28 DIAGNOSIS — R053 Chronic cough: Secondary | ICD-10-CM | POA: Diagnosis not present

## 2023-10-30 ENCOUNTER — Other Ambulatory Visit: Payer: Self-pay

## 2023-10-30 DIAGNOSIS — K219 Gastro-esophageal reflux disease without esophagitis: Secondary | ICD-10-CM

## 2023-10-30 MED ORDER — OMEPRAZOLE 40 MG PO CPDR: DELAYED_RELEASE_CAPSULE | ORAL | 1 refills | Status: DC

## 2023-11-06 ENCOUNTER — Ambulatory Visit: Payer: Self-pay | Admitting: Nurse Practitioner

## 2023-12-08 ENCOUNTER — Other Ambulatory Visit: Payer: Self-pay

## 2023-12-08 DIAGNOSIS — A0471 Enterocolitis due to Clostridium difficile, recurrent: Secondary | ICD-10-CM | POA: Diagnosis not present

## 2023-12-08 DIAGNOSIS — J309 Allergic rhinitis, unspecified: Secondary | ICD-10-CM

## 2023-12-08 DIAGNOSIS — R630 Anorexia: Secondary | ICD-10-CM | POA: Diagnosis not present

## 2023-12-08 MED ORDER — MONTELUKAST SODIUM 10 MG PO TABS: 10.0000 mg | ORAL_TABLET | Freq: Every day | ORAL | 3 refills | Status: AC

## 2023-12-16 ENCOUNTER — Ambulatory Visit: Admitting: Cardiovascular Disease

## 2023-12-25 ENCOUNTER — Ambulatory Visit (INDEPENDENT_AMBULATORY_CARE_PROVIDER_SITE_OTHER): Payer: Medicare Other | Admitting: Nurse Practitioner

## 2023-12-25 VITALS — BP 96/60 | HR 79 | Temp 98.1°F | Ht 59.0 in | Wt 97.8 lb

## 2023-12-25 DIAGNOSIS — R1319 Other dysphagia: Secondary | ICD-10-CM

## 2023-12-25 DIAGNOSIS — A498 Other bacterial infections of unspecified site: Secondary | ICD-10-CM | POA: Diagnosis not present

## 2023-12-25 DIAGNOSIS — F39 Unspecified mood [affective] disorder: Secondary | ICD-10-CM | POA: Diagnosis not present

## 2023-12-25 DIAGNOSIS — E039 Hypothyroidism, unspecified: Secondary | ICD-10-CM

## 2023-12-25 DIAGNOSIS — E43 Unspecified severe protein-calorie malnutrition: Secondary | ICD-10-CM | POA: Diagnosis not present

## 2023-12-25 DIAGNOSIS — I480 Paroxysmal atrial fibrillation: Secondary | ICD-10-CM

## 2023-12-25 MED ORDER — QUETIAPINE FUMARATE 25 MG PO TABS: 25.0000 mg | ORAL_TABLET | Freq: Every day | ORAL | 0 refills | Status: AC

## 2023-12-25 NOTE — Progress Notes (Signed)
 Leron Glance, NP-C Phone: [phone]  Kylie May is a 89 y.o. female who presents today for follow up.   Discussed the use of AI scribe software for clinical note transcription with the patient, who gave verbal consent to proceed.  History of Present Illness   Kylie May is a 89 year old female with recurrent Clostridioides difficile infection who presents for a six-month follow-up. She is accompanied by her daughter.  She has experienced approximately five episodes of recurrent Clostridioides difficile infections since October of the previous year. She is currently on Dificid  (fidaxomicin ) for treatment. Despite undergoing a fecal transplant, she experienced another episode of C. diff shortly thereafter. Followed by GI.   She struggles with maintaining her weight due to poor appetite. An appetite stimulant was considered, but her nephew expressed concerns about potential blood clot risks. She is on Eliquis  (apixaban ), a blood thinner. Despite challenges, her current weight is higher than it has been recently. She follows a low-fat diet due to a history of gallstones, and the absence of a feeding tube complicates finding suitable foods that are both nutritious and palatable to her.  She experiences anxiety and tends to fret over various tasks and responsibilities, which has been a long-standing personality trait. This behavior has become more pronounced as she is less able to perform these tasks herself, causing stress for her family. Her sleep is generally undisturbed, although she Kylie wake up early but does not get out of bed without assistance.  She has a history of difficulty swallowing, which has been a long-term issue. She avoids certain foods and sometimes struggles with swallowing pills. She prefers not to use methods like crushing pills into food.      Social History   Tobacco Use  Smoking Status Former   Current packs/day: 0.00   Types: Cigarettes   Quit date:  06/04/1951   Years since quitting: 72.6   Passive exposure: Past  Smokeless Tobacco Never  Tobacco Comments   Smoked 1945-1953 , up to 2 cigarettes/day    Current Outpatient Medications on File Prior to Visit  Medication Sig Dispense Refill   acetaminophen  (TYLENOL ) 325 MG tablet Take 2 tablets (650 mg total) by mouth every 6 (six) hours as needed for mild pain (or Fever >/= 101).     amiodarone  (PACERONE ) 200 MG tablet Take 1 tablet (200 mg total) by mouth daily. 90 tablet 3   apixaban  (ELIQUIS ) 2.5 MG TABS tablet TAKE 1 TABLET TWICE A DAY 180 tablet 0   azelastine  (ASTELIN ) 0.1 % nasal spray Place 2 sprays into both nostrils 2 (two) times daily. Use in each nostril as directed 30 mL 12   CRANBERRY PO Take 2 tablets by mouth daily.     famotidine  (PEPCID ) 40 MG tablet Take 20 mg by mouth daily.     ferrous sulfate  325 (65 FE) MG EC tablet Take 325 mg by mouth daily.     Fidaxomicin  (DIFICID  PO) Take by mouth. On hand, per GI but not taking at the moment as of 10/17/2023     fluticasone  (FLONASE ) 50 MCG/ACT nasal spray Place 2 sprays into both nostrils daily. 16 g 1   levothyroxine  (SYNTHROID ) 50 MCG tablet Take 1 tablet (50 mcg total) by mouth daily before breakfast. 90 tablet 3   magnesium  chloride (SLOW-MAG) 64 MG TBEC SR tablet Take 2 tablets (128 mg total) by mouth daily. 60 tablet 3   montelukast  (SINGULAIR ) 10 MG tablet Take 1 tablet (10 mg total) by  mouth at bedtime. 90 tablet 3   nystatin  cream (MYCOSTATIN )      omeprazole  (PRILOSEC) 40 MG capsule TAKE ONE CAPSULE BY MOUTH EVERY MORNING AND 1 AT BEDTIME AS DIRECTED 180 capsule 1   ondansetron  (ZOFRAN ) 4 MG tablet Take 1 tablet (4 mg total) by mouth daily as needed for nausea or vomiting. 30 tablet 1   OXYGEN Inhale 2 L/min into the lungs as needed.     Probiotic Product (PROBIOTIC PO) Take 1 capsule by mouth 2 (two) times daily.     furosemide  (LASIX ) 20 MG tablet Take 1 tablet (20 mg total) by mouth daily as needed (for wt gain of 3  lb in a day or 5 lb in a week). 90 tablet 3   No current facility-administered medications on file prior to visit.     ROS see history of present illness  Objective  Physical Exam Vitals:   12/25/23 1413  BP: 96/60  Pulse: 79  Temp: 98.1 F (36.7 C)  SpO2: 97%    BP Readings from Last 3 Encounters:  12/25/23 96/60  10/21/23 98/62  10/17/23 126/66   Wt Readings from Last 3 Encounters:  12/25/23 97 lb 12.8 oz (44.4 kg)  10/21/23 94 lb 13.6 oz (43 kg)  10/17/23 96 lb (43.5 kg)    Physical Exam Constitutional:      General: She is not in acute distress.    Appearance: Normal appearance.  HENT:     Head: Normocephalic.     Mouth/Throat:     Mouth: Mucous membranes are moist. No oral lesions.     Tongue: No lesions.     Pharynx: Oropharynx is clear. No posterior oropharyngeal erythema.  Cardiovascular:     Rate and Rhythm: Normal rate and regular rhythm.     Heart sounds: Normal heart sounds.  Pulmonary:     Effort: Pulmonary effort is normal.     Breath sounds: Normal breath sounds.  Skin:    General: Skin is warm and dry.  Neurological:     General: No focal deficit present.     Mental Status: She is alert.  Psychiatric:        Mood and Affect: Mood normal.        Behavior: Behavior normal.      Assessment/Plan: Please see individual problem list.  Mood disorder (HCC) Assessment & Plan: Seroquel  is discussed for symptom management and potential weight gain. Start Seroquel  25 mg daily at bedtime and monitor for drowsiness and behavior changes. Call in two weeks to assess Seroquel 's effectiveness.  Orders: -     QUEtiapine  Fumarate; Take 1 tablet (25 mg total) by mouth at bedtime.  Dispense: 30 tablet; Refill: 0  Severe protein-calorie malnutrition (HCC) Assessment & Plan: She struggles to maintain weight due to poor appetite and dietary restrictions. An appetite stimulant is considered, with the risk of blood clots mitigated by Eliquis . Increase  caloric intake while managing gallstones with a low-fat diet. We will continue to monitor.    Esophageal dysphagia Assessment & Plan: Chronic issue. Dysphagia is due to a small esophageal opening, improved with shake-like or creamy consistencies. There is difficulty swallowing, especially with medications and liquids. Consider diet modifications and possibly thickening liquids. Continue Omeprazole  40mg  twice daily.    Recurrent Clostridioides difficile infection Assessment & Plan: She has experienced five episodes since last October and is currently on Dificid  as advised by her gastroenterologist. 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 Continue Dificid  as needed and keep it  on hand for future antibiotic use. Follow up with GI as scheduled.   Orders: -     Comprehensive metabolic panel with GFR  Paroxysmal atrial fibrillation (HCC) Assessment & Plan: Continue Eliquis  2.5mg  twice daily and amiodarone  200mg  daily with regular follow-ups with cardiology.  Orders: -     CBC with Differential/Platelet      Return in about 6 months (around 06/26/2024) for Follow up.   Leron Glance, NP-C [facility]

## 2023-12-26 ENCOUNTER — Ambulatory Visit: Payer: Self-pay | Admitting: Nurse Practitioner

## 2023-12-26 LAB — CBC WITH DIFFERENTIAL/PLATELET
Basophils Absolute: 0 K/uL (ref 0.0–0.1)
Basophils Relative: 0.6 % (ref 0.0–3.0)
Eosinophils Absolute: 0.1 K/uL (ref 0.0–0.7)
Eosinophils Relative: 0.9 % (ref 0.0–5.0)
HCT: 40.1 % (ref 36.0–46.0)
Hemoglobin: 13.4 g/dL (ref 12.0–15.0)
Lymphocytes Relative: 25.9 % (ref 12.0–46.0)
Lymphs Abs: 2.1 K/uL (ref 0.7–4.0)
MCHC: 33.3 g/dL (ref 30.0–36.0)
MCV: 95.3 fl (ref 78.0–100.0)
Monocytes Absolute: 0.9 K/uL (ref 0.1–1.0)
Monocytes Relative: 10.7 % (ref 3.0–12.0)
Neutro Abs: 5 K/uL (ref 1.4–7.7)
Neutrophils Relative %: 61.9 % (ref 43.0–77.0)
Platelets: 310 K/uL (ref 150.0–400.0)
RBC: 4.2 Mil/uL (ref 3.87–5.11)
RDW: 13.9 % (ref 11.5–15.5)
WBC: 8.1 K/uL (ref 4.0–10.5)

## 2023-12-26 LAB — COMPREHENSIVE METABOLIC PANEL WITH GFR
ALT: 12 U/L (ref 0–35)
AST: 19 U/L (ref 0–37)
Albumin: 3.7 g/dL (ref 3.5–5.2)
Alkaline Phosphatase: 49 U/L (ref 39–117)
BUN: 14 mg/dL (ref 6–23)
CO2: 28 meq/L (ref 19–32)
Calcium: 9.2 mg/dL (ref 8.4–10.5)
Chloride: 98 meq/L (ref 96–112)
Creatinine, Ser: 0.88 mg/dL (ref 0.40–1.20)
GFR: 54.04 mL/min — ABNORMAL LOW (ref >60.00)
Glucose, Bld: 79 mg/dL (ref 70–99)
Potassium: 4.5 meq/L (ref 3.5–5.1)
Sodium: 133 meq/L — ABNORMAL LOW (ref 135–145)
Total Bilirubin: 0.4 mg/dL (ref 0.2–1.2)
Total Protein: 6.5 g/dL (ref 6.0–8.3)

## 2024-01-06 ENCOUNTER — Encounter: Payer: Self-pay | Admitting: Nurse Practitioner

## 2024-01-06 DIAGNOSIS — F39 Unspecified mood [affective] disorder: Secondary | ICD-10-CM | POA: Insufficient documentation

## 2024-01-06 NOTE — Assessment & Plan Note (Signed)
 She has experienced five episodes since last October and is currently on Dificid  as advised by her gastroenterologist. 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 Continue Dificid  as needed and keep it on hand for future antibiotic use. Follow up with GI as scheduled.

## 2024-01-06 NOTE — Assessment & Plan Note (Signed)
 Seroquel  is discussed for symptom management and potential weight gain. Start Seroquel  25 mg daily at bedtime and monitor for drowsiness and behavior changes. Call in two weeks to assess Seroquel 's effectiveness.

## 2024-01-06 NOTE — Assessment & Plan Note (Signed)
 Chronic issue. Dysphagia is due to a small esophageal opening, improved with shake-like or creamy consistencies. There is difficulty swallowing, especially with medications and liquids. Consider diet modifications and possibly thickening liquids. Continue Omeprazole  40mg  twice daily.

## 2024-01-06 NOTE — Assessment & Plan Note (Signed)
 She struggles to maintain weight due to poor appetite and dietary restrictions. An appetite stimulant is considered, with the risk of blood clots mitigated by Eliquis . Increase caloric intake while managing gallstones with a low-fat diet. We will continue to monitor.

## 2024-01-06 NOTE — Assessment & Plan Note (Signed)
 Continue Eliquis  2.5mg  twice daily and amiodarone  200mg  daily with regular follow-ups with cardiology.

## 2024-03-10 DIAGNOSIS — A0471 Enterocolitis due to Clostridium difficile, recurrent: Secondary | ICD-10-CM | POA: Diagnosis not present

## 2024-03-18 DIAGNOSIS — Z23 Encounter for immunization: Secondary | ICD-10-CM | POA: Diagnosis not present

## 2024-03-24 ENCOUNTER — Other Ambulatory Visit: Payer: Self-pay | Admitting: Nurse Practitioner

## 2024-03-24 DIAGNOSIS — K219 Gastro-esophageal reflux disease without esophagitis: Secondary | ICD-10-CM

## 2024-04-06 DIAGNOSIS — D2261 Melanocytic nevi of right upper limb, including shoulder: Secondary | ICD-10-CM | POA: Diagnosis not present

## 2024-04-06 DIAGNOSIS — D2262 Melanocytic nevi of left upper limb, including shoulder: Secondary | ICD-10-CM | POA: Diagnosis not present

## 2024-04-06 DIAGNOSIS — C44519 Basal cell carcinoma of skin of other part of trunk: Secondary | ICD-10-CM | POA: Diagnosis not present

## 2024-04-06 DIAGNOSIS — D0439 Carcinoma in situ of skin of other parts of face: Secondary | ICD-10-CM | POA: Diagnosis not present

## 2024-04-06 DIAGNOSIS — Z08 Encounter for follow-up examination after completed treatment for malignant neoplasm: Secondary | ICD-10-CM | POA: Diagnosis not present

## 2024-04-06 DIAGNOSIS — D225 Melanocytic nevi of trunk: Secondary | ICD-10-CM | POA: Diagnosis not present

## 2024-04-06 DIAGNOSIS — Z85828 Personal history of other malignant neoplasm of skin: Secondary | ICD-10-CM | POA: Diagnosis not present

## 2024-04-08 DIAGNOSIS — Z23 Encounter for immunization: Secondary | ICD-10-CM | POA: Diagnosis not present

## 2024-06-23 ENCOUNTER — Encounter: Payer: Self-pay | Admitting: Nurse Practitioner

## 2024-06-25 ENCOUNTER — Telehealth: Payer: Self-pay

## 2024-06-25 ENCOUNTER — Telehealth: Admitting: Nurse Practitioner

## 2024-06-25 VITALS — Ht 59.0 in

## 2024-06-25 DIAGNOSIS — F39 Unspecified mood [affective] disorder: Secondary | ICD-10-CM | POA: Diagnosis not present

## 2024-06-25 DIAGNOSIS — A498 Other bacterial infections of unspecified site: Secondary | ICD-10-CM | POA: Diagnosis not present

## 2024-06-25 DIAGNOSIS — E039 Hypothyroidism, unspecified: Secondary | ICD-10-CM

## 2024-06-25 DIAGNOSIS — I480 Paroxysmal atrial fibrillation: Secondary | ICD-10-CM

## 2024-06-25 NOTE — Telephone Encounter (Signed)
 Copied from CRM #[id number]. Topic: Clinical - Medication Question >> Jun 25, 2024  3:14 PM Rea C wrote: Reason for CRM: Pt's daughter thinks she gave her mom her evening pills instead of her morning pills and she just wants to make sure it is not a problem. Pt's daughter is asking what should she do as far as the evening with giving her mom her pills?   Erminio: [phone] (M) prefer call over Mychart message

## 2024-06-25 NOTE — Telephone Encounter (Signed)
 Called pts daughter as PCP wanted to see pt this morning virtually instead of scheduled 1 pm appt. MS. Darice stated she will reach out to her sister or nephew who is there with the pt to see what number they would like to use for the virtual.   Darice stated she would call us  back to let us  know who to contact.    E2C2 WHEN SHE CALLS BACK PLEASE TRANSFER CALL TO CAL

## 2024-06-25 NOTE — Telephone Encounter (Signed)
 Other daughter called and stated she cannot do it earlier than 1 pm

## 2024-06-25 NOTE — Telephone Encounter (Unsigned)
 Copied from CRM #[id number]. Topic: Clinical - Medication Question >> Jun 25, 2024  3:32 PM Alfonso ORN wrote: pt daughter called regarding pm pills, talked to family member who gave advice as its happened before so cancelling callback.

## 2024-06-25 NOTE — Progress Notes (Signed)
 "   MyChart Video Visit    Virtual Visit via Video Note   This visit type was conducted because this format is felt to be most appropriate for this patient at this time. Physical exam was limited by quality of the video and audio technology used for the visit. CMA was able to get the patient set up on a video visit.  Patient location: Home. Patient and provider in visit Provider location: Office  I discussed the limitations of evaluation and management by telemedicine and the availability of in person appointments. The patient expressed understanding and agreed to proceed.  Visit Date: 06/25/2024  Today's healthcare provider: Leron Glance, NP     Subjective:    Patient ID: Kylie May, female    DOB: Jul 22, 1921, 89 y.o.   MRN: [id number]  Chief Complaint  Patient presents with   Medical Management of Chronic Issues    HPI  Discussed the use of AI scribe software for clinical note transcription with the patient, who gave verbal consent to proceed.  History of Present Illness   Kylie May is a 89 year old female with recurrent Clostridioides difficile infection who presents for follow-up.  She has a history of recurrent Clostridioides difficile infection, with a recent recurrence in late October. This was treated with a second round of fecal microbiota transplantation, which has been more effective than the first, as there have been no significant recurrences since. She was also given a round of Dificid  after a suspected recurrence, which did not result in a full-blown infection. There were initial difficulties in obtaining prior authorization for Dificid , but these have been resolved. Previously, vancomycin  was used when antibiotics were needed, but now Dificid  is preferred.  She experiences chronic anxiety, which is situational and can be exacerbated by special occasions or when there is a lot going on. She often does not sleep well at night, feeling anxious and  thinking about various things, and compensates by sleeping in a chair during the day. She has a history of fretting over tasks and responsibilities, which has become more pronounced as she is less able to do things herself.  She has a history of atrial fibrillation and is currently taking Eliquis  and amiodarone . She also has hypothyroidism, managed with levothyroxine , which she takes first thing in the morning without other medications. She experiences a persistent cough, which is chronic and unchanged.      Past Medical History:  Diagnosis Date   A-fib (HCC)    Acid reflux disease    Arrhythmia    Atrial fibrillation with RVR (HCC) 08/15/2018   Chronic diastolic CHF (congestive heart failure) (HCC)    Compression fracture of lumbar vertebra (HCC)    Diverticulitis 01-2008   [facility]   Diverticulosis    Esophageal stricture 2006   Femoral bruit    Right   Fibrocystic breast disease    Hiatal hernia    History of esophageal stricture 06/14/2004   2006 esophageal dilation Schatzki's ring    History of skin cancer 12/29/2008   Basal cell cancer right glabella 07/10/2010 Dr. Dela, [facility] [place] , [place]    Hypercholesterolemia    F[place]ingham study LDL goal = < 160   Mitral regurgitation    a. 06/2014 EF 55-60%, elevated end-diastolic pressures, dilated LA at 4.3 cm, mildly dilated RA, severe mitral regurgitation, mild aortic sclerosis without stenosis, mod-severe TR   Mitral valve prolapse    Osteoporosis    Dr Johnnye   Pancreatitis  07-2008   Hospitalized    Patella fracture 05/13/2018   Schatzki's ring    Sepsis (HCC) 08/15/2018   Skin cancer    facial x 2. Dr Kylie May    Past Surgical History:  Procedure Laterality Date   BILIARY STENT PLACEMENT  08/21/2018   Procedure: BILIARY STENT PLACEMENT;  Surgeon: Saintclair Jasper, May;  Location: [facility] ENDOSCOPY;  Service: Gastroenterology;;   COLONOSCOPY  2006   Epidural steroids     x1   ERCP N/A 08/21/2018   Procedure: ENDOSCOPIC  RETROGRADE CHOLANGIOPANCREATOGRAPHY (ERCP);  Surgeon: Saintclair Jasper, May;  Location: [facility] ENDOSCOPY;  Service: Gastroenterology;  Laterality: N/A;   ESOPHAGEAL DILATION  2006   ESOPHAGOGASTRODUODENOSCOPY (EGD) WITH PROPOFOL  N/A 11/16/2018   Procedure: ESOPHAGOGASTRODUODENOSCOPY (EGD) WITH PROPOFOL ;  Surgeon: Unk Kylie May;  Location: [facility] ENDOSCOPY;  Service: Gastroenterology;  Laterality: N/A;   G3 P4 (twins)     IR CHOLANGIOGRAM EXISTING TUBE  08/18/2018   IR CHOLANGIOGRAM EXISTING TUBE  07/16/2022   IR CHOLANGIOGRAM EXISTING TUBE  04/28/2023   IR EXCHANGE BILIARY DRAIN  11/03/2018   IR EXCHANGE BILIARY DRAIN  12/01/2018   IR EXCHANGE BILIARY DRAIN  05/13/2019   IR EXCHANGE BILIARY DRAIN  06/14/2019   IR EXCHANGE BILIARY DRAIN  07/19/2019   IR EXCHANGE BILIARY DRAIN  08/17/2019   IR EXCHANGE BILIARY DRAIN  09/30/2019   IR EXCHANGE BILIARY DRAIN  12/08/2019   IR EXCHANGE BILIARY DRAIN  01/19/2020   IR EXCHANGE BILIARY DRAIN  03/01/2020   IR EXCHANGE BILIARY DRAIN  04/20/2020   IR EXCHANGE BILIARY DRAIN  07/06/2020   IR EXCHANGE BILIARY DRAIN  10/26/2020   IR EXCHANGE BILIARY DRAIN  01/18/2021   IR EXCHANGE BILIARY DRAIN  03/05/2021   IR EXCHANGE BILIARY DRAIN  05/25/2021   IR EXCHANGE BILIARY DRAIN  08/03/2021   IR EXCHANGE BILIARY DRAIN  09/28/2021   IR EXCHANGE BILIARY DRAIN  11/23/2021   IR EXCHANGE BILIARY DRAIN  12/27/2021   IR EXCHANGE BILIARY DRAIN  02/20/2022   IR EXCHANGE BILIARY DRAIN  04/17/2022   IR EXCHANGE BILIARY DRAIN  06/17/2022   IR EXCHANGE BILIARY DRAIN  07/12/2022   IR EXCHANGE BILIARY DRAIN  08/12/2022   IR EXCHANGE BILIARY DRAIN  09/20/2022   IR EXCHANGE BILIARY DRAIN  11/01/2022   IR EXCHANGE BILIARY DRAIN  12/31/2022   IR EXCHANGE BILIARY DRAIN  02/25/2023   IR EXCHANGE BILIARY DRAIN  03/12/2023   IR EXCHANGE BILIARY DRAIN  03/25/2023   IR EXCHANGE BILIARY DRAIN  05/12/2023   IR EXCHANGE BILIARY DRAIN  06/20/2023   IR PERC CHOLECYSTOSTOMY  08/16/2018   IR RADIOLOGIST EVAL & MGMT   11/08/2021   IR RADIOLOGIST EVAL & MGMT  01/10/2023   REMOVAL OF STONES  08/21/2018   Procedure: REMOVAL OF STONES;  Surgeon: Saintclair Jasper, May;  Location: [facility] ENDOSCOPY;  Service: Gastroenterology;;   SKIN CANCER EXCISION     SPHINCTEROTOMY  08/21/2018   Procedure: ANNETT;  Surgeon: Saintclair Jasper, May;  Location: [facility] ENDOSCOPY;  Service: Gastroenterology;;    Family History  Problem Relation Age of Onset   Breast cancer Maternal Aunt    Hepatitis Daughter        C   Lung disease Mother        lung tumor   Heart attack Daughter 6       S/P stents   Liver cancer Daughter    Heart attack Sister 50   Diabetes Neg Hx    Stroke Neg  Hx     Social History   Socioeconomic History   Marital status: Widowed    Spouse name: Not on file   Number of children: 3   Years of education: Not on file   Highest education level: Bachelor's degree (e.g., BA, AB, BS)  Occupational History   Occupation: Manufacturing engineer --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  Tobacco Use   Smoking status: Former    Current packs/day: 0.00    Types: Cigarettes    Quit date: 06/04/1951    Years since quitting: 73.1    Passive exposure: Past   Smokeless tobacco: Never   Tobacco comments:    Smoked 1945-1953 , up to 2 cigarettes/day  Vaping Use   Vaping status: Never Used  Substance and Sexual Activity   Alcohol use: Not Currently    Alcohol/week: 1.0 standard drink of alcohol    Types: 1 Glasses of wine per week    Comment: occassional   Drug use: No   Sexual activity: Not Currently  Other Topics Concern   Not on file  Social History Narrative   Has living will    Would want daughter Darice to be health care POA   Not sure about DNR--definitely doesn't want prolonged life support   Not sure about tube feeds either (but probably not)         Social Drivers of Health   Tobacco Use: Medium Risk (01/06/2024)   Patient History    Smoking Tobacco Use: Former    Smokeless Tobacco Use:  Never    Passive Exposure: Past  Physicist, Medical Strain: Low Risk (06/25/2024)   Overall Financial Resource Strain (CARDIA)    Difficulty of Paying Living Expenses: Not hard at all  Food Insecurity: No Food Insecurity (06/25/2024)   Epic    Worried About Radiation Protection Practitioner of Food in the Last Year: Never true    Ran Out of Food in the Last Year: Never true  Transportation Needs: No Transportation Needs (06/25/2024)   Epic    Lack of Transportation (Medical): No    Lack of Transportation (Non-Medical): No  Physical Activity: Inactive (06/25/2024)   Exercise Vital Sign    Days of Exercise per Week: 0 days    Minutes of Exercise per Session: Not on file  Stress: Patient Declined (06/25/2024)   [employer] of Occupational Health - Occupational Stress Questionnaire    Feeling of Stress: Patient declined  Social Connections: Socially Isolated (06/25/2024)   Social Connection and Isolation Panel    Frequency of Communication with Friends and Family: More than three times a week    Frequency of Social Gatherings with Friends and Family: More than three times a week    Attends Religious Services: Patient declined    Database Administrator or Organizations: No    Attends Banker Meetings: Not on file    Marital Status: Widowed  Intimate Partner Violence: Not At Risk (10/13/2023)   Humiliation, Afraid, Rape, and Kick questionnaire    Fear of Current or Ex-Partner: No    Emotionally Abused: No    Physically Abused: No    Sexually Abused: No  Depression (PHQ2-9): Low Risk (12/25/2023)   Depression (PHQ2-9)    PHQ-2 Score: 3  Alcohol Screen: Low Risk (03/24/2023)   Alcohol Screen    Last Alcohol Screening Score (AUDIT): 1  Housing: Low Risk (06/25/2024)   Epic    Unable to Pay for Housing in the Last Year: No  Number of Times Moved in the Last Year: 0    Homeless in the Last Year: No  Utilities: Not At Risk (12/08/2023)   Received from [facility]   Epic     In the past 12 months has the electric, gas, oil, or water company threatened to shut off services in your home?: No  Health Literacy: Not on file    Outpatient Medications Prior to Visit  Medication Sig Dispense Refill   acetaminophen  (TYLENOL ) 325 MG tablet Take 2 tablets (650 mg total) by mouth every 6 (six) hours as needed for mild pain (or Fever >/= 101).     amiodarone  (PACERONE ) 200 MG tablet Take 1 tablet (200 mg total) by mouth daily. 90 tablet 3   apixaban  (ELIQUIS ) 2.5 MG TABS tablet TAKE 1 TABLET TWICE A DAY 180 tablet 0   azelastine  (ASTELIN ) 0.1 % nasal spray Place 2 sprays into both nostrils 2 (two) times daily. Use in each nostril as directed 30 mL 12   CRANBERRY PO Take 2 tablets by mouth daily.     famotidine  (PEPCID ) 40 MG tablet Take 20 mg by mouth daily.     ferrous sulfate  325 (65 FE) MG EC tablet Take 325 mg by mouth daily.     Fidaxomicin  (DIFICID  PO) Take by mouth. On hand, per GI but not taking at the moment as of 10/17/2023     fluticasone  (FLONASE ) 50 MCG/ACT nasal spray Place 2 sprays into both nostrils daily. 16 g 1   furosemide  (LASIX ) 20 MG tablet Take 1 tablet (20 mg total) by mouth daily as needed (for wt gain of 3 lb in a day or 5 lb in a week). 90 tablet 3   levothyroxine  (SYNTHROID ) 50 MCG tablet Take 1 tablet (50 mcg total) by mouth daily before breakfast. 90 tablet 3   magnesium  chloride (SLOW-MAG) 64 MG TBEC SR tablet Take 2 tablets (128 mg total) by mouth daily. 60 tablet 3   montelukast  (SINGULAIR ) 10 MG tablet Take 1 tablet (10 mg total) by mouth at bedtime. 90 tablet 3   nystatin  cream (MYCOSTATIN )      omeprazole  (PRILOSEC) 40 MG capsule TAKE 1 CAPSULE EVERY MORNING AND 1 CAPSULE AT BEDTIME AS DIRECTED 180 capsule 2   OXYGEN Inhale 2 L/min into the lungs as needed.     Probiotic Product (PROBIOTIC PO) Take 1 capsule by mouth 2 (two) times daily.     QUEtiapine  (SEROQUEL ) 25 MG tablet Take 1 tablet (25 mg total) by mouth at bedtime. 30 tablet 0   No  facility-administered medications prior to visit.    Allergies[1]  ROS See HPI    Objective:    Physical Exam  Ht 4' 11 (1.499 m)   BMI 19.75 kg/m  Wt Readings from Last 3 Encounters:  12/25/23 97 lb 12.8 oz (44.4 kg)  10/21/23 94 lb 13.6 oz (43 kg)  10/17/23 96 lb (43.5 kg)   GENERAL: alert, oriented, appears well and in no acute distress   HEENT: atraumatic, conjunttiva clear, no obvious abnormalities on inspection of external nose and ears   NECK: normal movements of the head and neck   LUNGS: on inspection no signs of respiratory distress, breathing rate appears normal, no obvious gross SOB, gasping or wheezing   CV: no obvious cyanosis   MS: moves all visible extremities without noticeable abnormality   PSYCH/NEURO: pleasant and cooperative, no obvious depression or anxiety, speech and thought processing grossly intact    Assessment &  Plan:   Problem List Items Addressed This Visit       Cardiovascular and Mediastinum   Paroxysmal atrial fibrillation (HCC) - Primary   Her paroxysmal atrial fibrillation is stable with no recent issues, managed with Eliquis  and amiodarone . Continue both medications and schedule a follow-up with cardiology in May.        Digestive   Recurrent Clostridioides difficile infection   Managed by GI. 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 Effectively managed with Dificid , with no significant recurrence since treatment. Continue Dificid  as needed with antibiotic use. Follow up with GI as scheduled.         Endocrine   Hypothyroidism   Hypothyroidism remains stable on levothyroxine  with no issues. Continue levothyroxine , taking it first thing in the morning without other medications.        Other   Mood disorder   Situational anxiety and irritability was discussed, and quetiapine  is considered for its benefits and potential side effects. Use quetiapine  as needed at bedtime. We will continue to monitor.        I am  having Maty F. Elaine maintain her magnesium  chloride, ferrous sulfate , acetaminophen , nystatin  cream, Probiotic Product (PROBIOTIC PO), azelastine , CRANBERRY PO, fluticasone , levothyroxine , OXYGEN, famotidine , Eliquis , Fidaxomicin  (DIFICID  PO), amiodarone , furosemide , montelukast , QUEtiapine , and omeprazole .  No orders of the defined types were placed in this encounter.   I discussed the assessment and treatment plan with the patient. The patient was provided an opportunity to ask questions and all were answered. The patient agreed with the plan and demonstrated an understanding of the instructions.   The patient was advised to call back or seek an in-person evaluation if the symptoms worsen or if the condition fails to improve as anticipated.   Leron Glance, NP [facility] [phone] (phone) [phone] (fax)  [facility]      [1]  Allergies Allergen Reactions   Metronidazole      Other reaction(s): Dizziness and giddiness (finding), Other (qualifier value) made things feel like they were vibrating like the bed and floor,also caused mild dizziness weakness   Antihistamines, Loratadine -Type Rash    Peeling skin    Penicillin G Rash   "

## 2024-06-25 NOTE — Telephone Encounter (Signed)
 noted

## 2024-07-06 ENCOUNTER — Encounter: Payer: Self-pay | Admitting: Nurse Practitioner

## 2024-07-06 NOTE — Assessment & Plan Note (Addendum)
 Her paroxysmal atrial fibrillation is stable with no recent issues, managed with Eliquis  and amiodarone . Continue both medications and schedule a follow-up with cardiology in May.

## 2024-07-08 ENCOUNTER — Other Ambulatory Visit: Payer: Self-pay | Admitting: Nurse Practitioner

## 2024-07-08 DIAGNOSIS — E039 Hypothyroidism, unspecified: Secondary | ICD-10-CM
